# Patient Record
Sex: Female | Born: 1935
Health system: Southern US, Community
[De-identification: ages and names within clinical notes are randomized; demographics above are authoritative.]

## PROBLEM LIST (undated history)

## (undated) DIAGNOSIS — J189 Pneumonia, unspecified organism: Secondary | ICD-10-CM

## (undated) DIAGNOSIS — M7541 Impingement syndrome of right shoulder: Secondary | ICD-10-CM

## (undated) DIAGNOSIS — E669 Obesity, unspecified: Secondary | ICD-10-CM

## (undated) DIAGNOSIS — J45909 Unspecified asthma, uncomplicated: Secondary | ICD-10-CM

## (undated) DIAGNOSIS — E039 Hypothyroidism, unspecified: Secondary | ICD-10-CM

## (undated) DIAGNOSIS — M545 Low back pain, unspecified: Secondary | ICD-10-CM

## (undated) DIAGNOSIS — R350 Frequency of micturition: Secondary | ICD-10-CM

## (undated) DIAGNOSIS — I872 Venous insufficiency (chronic) (peripheral): Secondary | ICD-10-CM

## (undated) DIAGNOSIS — M549 Dorsalgia, unspecified: Secondary | ICD-10-CM

## (undated) DIAGNOSIS — R011 Cardiac murmur, unspecified: Secondary | ICD-10-CM

## (undated) DIAGNOSIS — J309 Allergic rhinitis, unspecified: Secondary | ICD-10-CM

## (undated) DIAGNOSIS — Z853 Personal history of malignant neoplasm of breast: Secondary | ICD-10-CM

## (undated) DIAGNOSIS — K219 Gastro-esophageal reflux disease without esophagitis: Secondary | ICD-10-CM

## (undated) DIAGNOSIS — I2510 Atherosclerotic heart disease of native coronary artery without angina pectoris: Secondary | ICD-10-CM

## (undated) DIAGNOSIS — C801 Malignant (primary) neoplasm, unspecified: Secondary | ICD-10-CM

## (undated) DIAGNOSIS — E785 Hyperlipidemia, unspecified: Secondary | ICD-10-CM

## (undated) DIAGNOSIS — M1990 Unspecified osteoarthritis, unspecified site: Secondary | ICD-10-CM

## (undated) DIAGNOSIS — G8929 Other chronic pain: Secondary | ICD-10-CM

## (undated) DIAGNOSIS — R600 Localized edema: Secondary | ICD-10-CM

## (undated) DIAGNOSIS — I10 Essential (primary) hypertension: Secondary | ICD-10-CM

## (undated) DIAGNOSIS — I288 Other diseases of pulmonary vessels: Secondary | ICD-10-CM

## (undated) HISTORY — DX: Venous insufficiency (chronic) (peripheral): I87.2

## (undated) HISTORY — PX: HERNIA REPAIR: SHX51

## (undated) HISTORY — DX: Impingement syndrome of right shoulder: M75.41

## (undated) HISTORY — DX: Personal history of malignant neoplasm of breast: Z85.3

## (undated) HISTORY — DX: Allergic rhinitis, unspecified: J30.9

## (undated) HISTORY — PX: BREAST LUMPECTOMY: SHX2

## (undated) HISTORY — DX: Hyperlipidemia, unspecified: E78.5

## (undated) HISTORY — DX: Hypothyroidism, unspecified: E03.9

## (undated) HISTORY — DX: Other diseases of pulmonary vessels: I28.8

## (undated) HISTORY — DX: Obesity, unspecified: E66.9

## (undated) HISTORY — PX: COLONOSCOPY: SHX174

## (undated) HISTORY — DX: Low back pain, unspecified: M54.50

## (undated) HISTORY — DX: Pneumonia, unspecified organism: J18.9

## (undated) HISTORY — DX: Localized edema: R60.0

## (undated) HISTORY — DX: Atherosclerotic heart disease of native coronary artery without angina pectoris: I25.10

## (undated) HISTORY — PX: JOINT REPLACEMENT: SHX530

## (undated) HISTORY — DX: Gastro-esophageal reflux disease without esophagitis: K21.9

## (undated) HISTORY — DX: Low back pain: M54.5

## (undated) HISTORY — PX: ABDOMINAL HYSTERECTOMY: SHX81

## (undated) HISTORY — PX: TOTAL HIP ARTHROPLASTY: SHX124

## (undated) HISTORY — DX: Unspecified osteoarthritis, unspecified site: M19.90

---
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
## 1999-01-04 ENCOUNTER — Encounter: Admission: RE | Admit: 1999-01-04 | Discharge: 1999-01-04 | Payer: Self-pay | Admitting: Internal Medicine

## 2000-01-29 ENCOUNTER — Encounter: Payer: Self-pay | Admitting: Emergency Medicine

## 2000-01-29 ENCOUNTER — Emergency Department (HOSPITAL_COMMUNITY): Admission: EM | Admit: 2000-01-29 | Discharge: 2000-01-29 | Payer: Self-pay | Admitting: Emergency Medicine

## 2000-06-26 ENCOUNTER — Encounter: Admission: RE | Admit: 2000-06-26 | Discharge: 2000-06-26 | Payer: Self-pay | Admitting: Internal Medicine

## 2000-07-06 ENCOUNTER — Encounter: Admission: RE | Admit: 2000-07-06 | Discharge: 2000-07-06 | Payer: Self-pay | Admitting: Internal Medicine

## 2000-08-24 ENCOUNTER — Encounter: Admission: RE | Admit: 2000-08-24 | Discharge: 2000-08-24 | Payer: Self-pay | Admitting: Internal Medicine

## 2001-02-19 ENCOUNTER — Encounter: Admission: RE | Admit: 2001-02-19 | Discharge: 2001-02-19 | Payer: Self-pay | Admitting: Internal Medicine

## 2001-03-02 ENCOUNTER — Ambulatory Visit (HOSPITAL_COMMUNITY): Admission: RE | Admit: 2001-03-02 | Discharge: 2001-03-02 | Payer: Self-pay | Admitting: Internal Medicine

## 2001-03-02 ENCOUNTER — Encounter: Payer: Self-pay | Admitting: Internal Medicine

## 2002-04-18 ENCOUNTER — Encounter: Admission: RE | Admit: 2002-04-18 | Discharge: 2002-04-18 | Payer: Self-pay | Admitting: Internal Medicine

## 2002-06-03 ENCOUNTER — Encounter: Admission: RE | Admit: 2002-06-03 | Discharge: 2002-06-03 | Payer: Self-pay | Admitting: Internal Medicine

## 2002-06-19 ENCOUNTER — Encounter: Admission: RE | Admit: 2002-06-19 | Discharge: 2002-06-19 | Payer: Self-pay | Admitting: Internal Medicine

## 2002-06-26 ENCOUNTER — Encounter: Admission: RE | Admit: 2002-06-26 | Discharge: 2002-06-26 | Payer: Self-pay | Admitting: Internal Medicine

## 2002-07-03 ENCOUNTER — Encounter: Admission: RE | Admit: 2002-07-03 | Discharge: 2002-07-03 | Payer: Self-pay | Admitting: Internal Medicine

## 2002-07-09 ENCOUNTER — Encounter: Admission: RE | Admit: 2002-07-09 | Discharge: 2002-07-09 | Payer: Self-pay | Admitting: Internal Medicine

## 2002-07-09 ENCOUNTER — Ambulatory Visit (HOSPITAL_COMMUNITY): Admission: RE | Admit: 2002-07-09 | Discharge: 2002-07-09 | Payer: Self-pay | Admitting: Internal Medicine

## 2002-07-09 ENCOUNTER — Encounter (INDEPENDENT_AMBULATORY_CARE_PROVIDER_SITE_OTHER): Payer: Self-pay | Admitting: Cardiology

## 2002-07-17 ENCOUNTER — Encounter: Admission: RE | Admit: 2002-07-17 | Discharge: 2002-07-17 | Payer: Self-pay | Admitting: Internal Medicine

## 2002-08-01 ENCOUNTER — Encounter: Admission: RE | Admit: 2002-08-01 | Discharge: 2002-08-01 | Payer: Self-pay | Admitting: Internal Medicine

## 2002-08-08 ENCOUNTER — Encounter: Admission: RE | Admit: 2002-08-08 | Discharge: 2002-08-08 | Payer: Self-pay | Admitting: Internal Medicine

## 2002-08-13 ENCOUNTER — Encounter: Admission: RE | Admit: 2002-08-13 | Discharge: 2002-08-13 | Payer: Self-pay | Admitting: Internal Medicine

## 2002-08-19 ENCOUNTER — Encounter: Admission: RE | Admit: 2002-08-19 | Discharge: 2002-08-19 | Payer: Self-pay | Admitting: Internal Medicine

## 2002-08-26 ENCOUNTER — Encounter: Admission: RE | Admit: 2002-08-26 | Discharge: 2002-08-26 | Payer: Self-pay | Admitting: Internal Medicine

## 2002-09-04 ENCOUNTER — Encounter: Admission: RE | Admit: 2002-09-04 | Discharge: 2002-09-04 | Payer: Self-pay | Admitting: Internal Medicine

## 2002-09-18 ENCOUNTER — Encounter: Admission: RE | Admit: 2002-09-18 | Discharge: 2002-09-18 | Payer: Self-pay | Admitting: Internal Medicine

## 2002-10-17 ENCOUNTER — Encounter: Admission: RE | Admit: 2002-10-17 | Discharge: 2002-10-17 | Payer: Self-pay | Admitting: Internal Medicine

## 2002-12-18 ENCOUNTER — Encounter: Admission: RE | Admit: 2002-12-18 | Discharge: 2002-12-18 | Payer: Self-pay | Admitting: Internal Medicine

## 2002-12-30 ENCOUNTER — Encounter: Admission: RE | Admit: 2002-12-30 | Discharge: 2002-12-30 | Payer: Self-pay | Admitting: Internal Medicine

## 2003-05-29 ENCOUNTER — Encounter: Admission: RE | Admit: 2003-05-29 | Discharge: 2003-05-29 | Payer: Self-pay | Admitting: Internal Medicine

## 2003-08-27 ENCOUNTER — Emergency Department (HOSPITAL_COMMUNITY): Admission: EM | Admit: 2003-08-27 | Discharge: 2003-08-27 | Payer: Self-pay | Admitting: Emergency Medicine

## 2003-09-04 ENCOUNTER — Encounter: Admission: RE | Admit: 2003-09-04 | Discharge: 2003-09-04 | Payer: Self-pay | Admitting: Internal Medicine

## 2003-11-24 ENCOUNTER — Ambulatory Visit: Payer: Self-pay | Admitting: Internal Medicine

## 2004-04-20 ENCOUNTER — Ambulatory Visit: Payer: Self-pay | Admitting: Internal Medicine

## 2004-05-06 ENCOUNTER — Ambulatory Visit: Payer: Self-pay | Admitting: Internal Medicine

## 2004-08-16 ENCOUNTER — Ambulatory Visit: Payer: Self-pay | Admitting: Internal Medicine

## 2004-08-31 ENCOUNTER — Ambulatory Visit: Payer: Self-pay | Admitting: Internal Medicine

## 2004-09-16 ENCOUNTER — Ambulatory Visit: Payer: Self-pay | Admitting: Internal Medicine

## 2004-09-30 ENCOUNTER — Ambulatory Visit: Payer: Self-pay | Admitting: Internal Medicine

## 2004-11-09 ENCOUNTER — Ambulatory Visit: Payer: Self-pay | Admitting: Internal Medicine

## 2004-12-08 ENCOUNTER — Ambulatory Visit: Payer: Self-pay | Admitting: Internal Medicine

## 2005-01-11 ENCOUNTER — Ambulatory Visit: Payer: Self-pay | Admitting: Internal Medicine

## 2005-01-11 ENCOUNTER — Encounter (INDEPENDENT_AMBULATORY_CARE_PROVIDER_SITE_OTHER): Payer: Self-pay | Admitting: *Deleted

## 2005-05-02 ENCOUNTER — Ambulatory Visit: Payer: Self-pay | Admitting: Internal Medicine

## 2005-05-04 ENCOUNTER — Encounter: Admission: RE | Admit: 2005-05-04 | Discharge: 2005-05-04 | Payer: Self-pay | Admitting: Family Medicine

## 2005-05-09 ENCOUNTER — Ambulatory Visit: Payer: Self-pay | Admitting: Hospitalist

## 2005-05-10 ENCOUNTER — Ambulatory Visit: Payer: Self-pay | Admitting: Internal Medicine

## 2005-05-18 ENCOUNTER — Ambulatory Visit: Payer: Self-pay | Admitting: Internal Medicine

## 2005-05-27 ENCOUNTER — Ambulatory Visit: Payer: Self-pay | Admitting: Internal Medicine

## 2005-05-31 ENCOUNTER — Encounter: Admission: RE | Admit: 2005-05-31 | Discharge: 2005-08-29 | Payer: Self-pay | Admitting: Pulmonary Disease

## 2005-06-01 ENCOUNTER — Ambulatory Visit: Payer: Self-pay | Admitting: Internal Medicine

## 2005-06-12 ENCOUNTER — Emergency Department (HOSPITAL_COMMUNITY): Admission: EM | Admit: 2005-06-12 | Discharge: 2005-06-12 | Payer: Self-pay | Admitting: Emergency Medicine

## 2005-06-15 ENCOUNTER — Ambulatory Visit: Payer: Self-pay | Admitting: Internal Medicine

## 2005-07-12 ENCOUNTER — Encounter: Payer: Self-pay | Admitting: Orthopedic Surgery

## 2005-07-20 ENCOUNTER — Ambulatory Visit: Payer: Self-pay | Admitting: Hospitalist

## 2005-07-20 ENCOUNTER — Inpatient Hospital Stay (HOSPITAL_COMMUNITY): Admission: RE | Admit: 2005-07-20 | Discharge: 2005-07-27 | Payer: Self-pay | Admitting: Orthopedic Surgery

## 2005-07-21 ENCOUNTER — Ambulatory Visit: Payer: Self-pay | Admitting: Physical Medicine & Rehabilitation

## 2005-07-22 ENCOUNTER — Encounter (INDEPENDENT_AMBULATORY_CARE_PROVIDER_SITE_OTHER): Payer: Self-pay | Admitting: *Deleted

## 2005-07-22 ENCOUNTER — Encounter (INDEPENDENT_AMBULATORY_CARE_PROVIDER_SITE_OTHER): Payer: Self-pay | Admitting: Pulmonary Disease

## 2005-08-16 ENCOUNTER — Ambulatory Visit: Payer: Self-pay | Admitting: Internal Medicine

## 2005-10-18 ENCOUNTER — Ambulatory Visit: Payer: Self-pay | Admitting: Internal Medicine

## 2005-10-28 ENCOUNTER — Encounter (INDEPENDENT_AMBULATORY_CARE_PROVIDER_SITE_OTHER): Payer: Self-pay | Admitting: *Deleted

## 2005-10-28 ENCOUNTER — Ambulatory Visit (HOSPITAL_COMMUNITY): Admission: RE | Admit: 2005-10-28 | Discharge: 2005-10-28 | Payer: Self-pay | Admitting: Internal Medicine

## 2005-11-03 ENCOUNTER — Encounter (INDEPENDENT_AMBULATORY_CARE_PROVIDER_SITE_OTHER): Payer: Self-pay | Admitting: *Deleted

## 2005-11-03 ENCOUNTER — Encounter (INDEPENDENT_AMBULATORY_CARE_PROVIDER_SITE_OTHER): Payer: Self-pay | Admitting: Diagnostic Radiology

## 2005-11-03 ENCOUNTER — Encounter: Admission: RE | Admit: 2005-11-03 | Discharge: 2005-11-03 | Payer: Self-pay | Admitting: Internal Medicine

## 2005-11-16 ENCOUNTER — Encounter: Admission: RE | Admit: 2005-11-16 | Discharge: 2005-11-16 | Payer: Self-pay | Admitting: Internal Medicine

## 2005-11-16 ENCOUNTER — Encounter (INDEPENDENT_AMBULATORY_CARE_PROVIDER_SITE_OTHER): Payer: Self-pay | Admitting: *Deleted

## 2005-12-13 ENCOUNTER — Encounter (INDEPENDENT_AMBULATORY_CARE_PROVIDER_SITE_OTHER): Payer: Self-pay | Admitting: *Deleted

## 2005-12-13 DIAGNOSIS — M1990 Unspecified osteoarthritis, unspecified site: Secondary | ICD-10-CM

## 2005-12-13 DIAGNOSIS — I10 Essential (primary) hypertension: Secondary | ICD-10-CM

## 2005-12-13 HISTORY — DX: Unspecified osteoarthritis, unspecified site: M19.90

## 2005-12-20 ENCOUNTER — Ambulatory Visit (HOSPITAL_COMMUNITY): Admission: RE | Admit: 2005-12-20 | Discharge: 2005-12-20 | Payer: Self-pay | Admitting: *Deleted

## 2005-12-20 ENCOUNTER — Encounter (INDEPENDENT_AMBULATORY_CARE_PROVIDER_SITE_OTHER): Payer: Self-pay | Admitting: Specialist

## 2005-12-20 ENCOUNTER — Encounter: Admission: RE | Admit: 2005-12-20 | Discharge: 2005-12-20 | Payer: Self-pay | Admitting: *Deleted

## 2006-02-09 ENCOUNTER — Ambulatory Visit: Payer: Self-pay | Admitting: Internal Medicine

## 2006-03-15 ENCOUNTER — Ambulatory Visit: Payer: Self-pay | Admitting: Hospitalist

## 2006-03-15 ENCOUNTER — Ambulatory Visit (HOSPITAL_COMMUNITY): Admission: RE | Admit: 2006-03-15 | Discharge: 2006-03-15 | Payer: Self-pay | Admitting: Hospitalist

## 2006-03-15 ENCOUNTER — Encounter (INDEPENDENT_AMBULATORY_CARE_PROVIDER_SITE_OTHER): Payer: Self-pay | Admitting: *Deleted

## 2006-03-15 LAB — CONVERTED CEMR LAB
BUN: 18 mg/dL (ref 6–23)
Chloride: 100 meq/L (ref 96–112)
Glucose, Bld: 95 mg/dL (ref 70–99)
HCT: 35.5 % — ABNORMAL LOW (ref 36.0–46.0)
Potassium: 3.9 meq/L (ref 3.5–5.3)
RDW: 13.4 % (ref 11.5–14.0)
Sodium: 136 meq/L (ref 135–145)
TSH: 6.912 microintl units/mL

## 2006-03-20 DIAGNOSIS — Z853 Personal history of malignant neoplasm of breast: Secondary | ICD-10-CM

## 2006-03-20 DIAGNOSIS — I872 Venous insufficiency (chronic) (peripheral): Secondary | ICD-10-CM

## 2006-03-20 DIAGNOSIS — E669 Obesity, unspecified: Secondary | ICD-10-CM

## 2006-03-20 DIAGNOSIS — M5441 Lumbago with sciatica, right side: Secondary | ICD-10-CM | POA: Insufficient documentation

## 2006-03-20 DIAGNOSIS — E039 Hypothyroidism, unspecified: Secondary | ICD-10-CM

## 2006-03-20 HISTORY — DX: Hypothyroidism, unspecified: E03.9

## 2006-03-20 HISTORY — DX: Obesity, unspecified: E66.9

## 2006-03-20 HISTORY — DX: Venous insufficiency (chronic) (peripheral): I87.2

## 2006-03-21 ENCOUNTER — Ambulatory Visit: Payer: Self-pay | Admitting: Gastroenterology

## 2006-06-01 ENCOUNTER — Ambulatory Visit: Payer: Self-pay | Admitting: Internal Medicine

## 2006-06-21 ENCOUNTER — Telehealth: Payer: Self-pay | Admitting: *Deleted

## 2006-06-22 ENCOUNTER — Encounter (INDEPENDENT_AMBULATORY_CARE_PROVIDER_SITE_OTHER): Payer: Self-pay | Admitting: *Deleted

## 2006-06-22 ENCOUNTER — Ambulatory Visit (HOSPITAL_COMMUNITY): Admission: RE | Admit: 2006-06-22 | Discharge: 2006-06-22 | Payer: Self-pay | Admitting: Internal Medicine

## 2006-09-05 ENCOUNTER — Telehealth: Payer: Self-pay | Admitting: *Deleted

## 2006-09-06 ENCOUNTER — Ambulatory Visit: Payer: Self-pay | Admitting: Internal Medicine

## 2006-09-06 DIAGNOSIS — J309 Allergic rhinitis, unspecified: Secondary | ICD-10-CM | POA: Insufficient documentation

## 2006-09-07 ENCOUNTER — Telehealth: Payer: Self-pay | Admitting: *Deleted

## 2006-10-20 ENCOUNTER — Ambulatory Visit: Payer: Self-pay | Admitting: Internal Medicine

## 2006-11-17 ENCOUNTER — Encounter: Admission: RE | Admit: 2006-11-17 | Discharge: 2006-11-17 | Payer: Self-pay | Admitting: Surgery

## 2007-02-13 ENCOUNTER — Telehealth: Payer: Self-pay | Admitting: Internal Medicine

## 2007-02-15 ENCOUNTER — Telehealth: Payer: Self-pay | Admitting: Internal Medicine

## 2007-03-19 ENCOUNTER — Telehealth: Payer: Self-pay | Admitting: Internal Medicine

## 2007-05-01 ENCOUNTER — Encounter: Payer: Self-pay | Admitting: Internal Medicine

## 2007-05-01 ENCOUNTER — Ambulatory Visit: Payer: Self-pay | Admitting: Internal Medicine

## 2007-05-02 DIAGNOSIS — E785 Hyperlipidemia, unspecified: Secondary | ICD-10-CM | POA: Insufficient documentation

## 2007-05-03 LAB — CONVERTED CEMR LAB
Basophils Absolute: 0 10*3/uL (ref 0.0–0.1)
Basophils Relative: 1 % (ref 0–1)
Cholesterol: 194 mg/dL (ref 0–200)
HCT: 38.1 % (ref 36.0–46.0)
HDL: 50 mg/dL (ref >39)
LDL Cholesterol: 122 mg/dL — ABNORMAL HIGH (ref 0–99)
Lymphocytes Relative: 25 % (ref 12–46)
MCV: 86.8 fL (ref 78.0–100.0)
Monocytes Absolute: 0.7 10*3/uL (ref 0.1–1.0)
Monocytes Relative: 8 % (ref 3–12)
Neutrophils Relative %: 62 % (ref 43–77)
Platelets: 340 10*3/uL (ref 150–400)
RDW: 15 % (ref 11.5–15.5)
Total CHOL/HDL Ratio: 3.9
VLDL: 22 mg/dL (ref 0–40)

## 2007-05-28 ENCOUNTER — Telehealth: Payer: Self-pay | Admitting: Internal Medicine

## 2007-07-19 ENCOUNTER — Telehealth: Payer: Self-pay | Admitting: Internal Medicine

## 2007-08-27 ENCOUNTER — Telehealth: Payer: Self-pay | Admitting: Internal Medicine

## 2007-09-04 ENCOUNTER — Telehealth: Payer: Self-pay | Admitting: Internal Medicine

## 2007-09-14 ENCOUNTER — Ambulatory Visit: Payer: Self-pay | Admitting: Infectious Diseases

## 2007-09-14 ENCOUNTER — Encounter: Payer: Self-pay | Admitting: Internal Medicine

## 2007-09-14 LAB — CONVERTED CEMR LAB
ALT: 8 units/L (ref 0–35)
AST: 14 units/L (ref 0–37)
Alkaline Phosphatase: 67 units/L (ref 39–117)
BUN: 20 mg/dL (ref 6–23)
Chloride: 100 meq/L (ref 96–112)
Creatinine, Ser: 0.89 mg/dL (ref 0.40–1.20)
Total Bilirubin: 0.3 mg/dL (ref 0.3–1.2)
Total CHOL/HDL Ratio: 3
Total Protein: 7.6 g/dL (ref 6.0–8.3)

## 2007-09-19 ENCOUNTER — Telehealth: Payer: Self-pay | Admitting: Internal Medicine

## 2007-11-19 ENCOUNTER — Encounter: Admission: RE | Admit: 2007-11-19 | Discharge: 2007-11-19 | Payer: Self-pay | Admitting: Surgery

## 2007-12-10 ENCOUNTER — Ambulatory Visit: Payer: Self-pay | Admitting: Internal Medicine

## 2007-12-10 ENCOUNTER — Encounter: Payer: Self-pay | Admitting: Internal Medicine

## 2007-12-10 LAB — CONVERTED CEMR LAB
ALT: <8 units/L (ref 0–35)
AST: 14 units/L (ref 0–37)
Basophils Absolute: 0 10*3/uL (ref 0.0–0.1)
Basophils Relative: 0 % (ref 0–1)
CO2: 21 meq/L (ref 19–32)
Calcium: 9.4 mg/dL (ref 8.4–10.5)
Chloride: 98 meq/L (ref 96–112)
Creatinine, Ser: 1.06 mg/dL (ref 0.40–1.20)
Eosinophils Absolute: 0.3 10*3/uL (ref 0.0–0.7)
HCT: 39.1 % (ref 36.0–46.0)
Hemoglobin, Urine: NEGATIVE
Hemoglobin: 12.6 g/dL (ref 12.0–15.0)
Lymphocytes Relative: 25 % (ref 12–46)
Lymphs Abs: 2 10*3/uL (ref 0.7–4.0)
MCHC: 32.2 g/dL (ref 30.0–36.0)
MCV: 84.8 fL (ref 78.0–100.0)
Monocytes Relative: 10 % (ref 3–12)
Platelets: 337 10*3/uL (ref 150–400)
Potassium: 3.5 meq/L (ref 3.5–5.3)
Protein, ur: NEGATIVE mg/dL
RBC: 4.61 M/uL (ref 3.87–5.11)
RDW: 13.8 % (ref 11.5–15.5)
Sodium: 137 meq/L (ref 135–145)
TSH: 4.475 microintl units/mL (ref 0.350–4.50)
Total Protein: 7.6 g/dL (ref 6.0–8.3)
Urobilinogen, UA: 0.2 (ref 0.0–1.0)
WBC: 8 10*3/uL (ref 4.0–10.5)
pH: 6 (ref 5.0–8.0)

## 2008-01-21 ENCOUNTER — Telehealth: Payer: Self-pay | Admitting: Internal Medicine

## 2008-02-18 ENCOUNTER — Telehealth: Payer: Self-pay | Admitting: Internal Medicine

## 2008-03-12 ENCOUNTER — Telehealth: Payer: Self-pay | Admitting: Internal Medicine

## 2008-03-26 ENCOUNTER — Encounter: Payer: Self-pay | Admitting: Internal Medicine

## 2008-03-26 ENCOUNTER — Ambulatory Visit: Payer: Self-pay | Admitting: Internal Medicine

## 2008-04-04 LAB — CONVERTED CEMR LAB
CO2: 21 meq/L (ref 19–32)
Chloride: 100 meq/L (ref 96–112)
Potassium: 4.2 meq/L (ref 3.5–5.3)

## 2008-04-18 ENCOUNTER — Telehealth: Payer: Self-pay | Admitting: Internal Medicine

## 2008-04-23 ENCOUNTER — Ambulatory Visit (HOSPITAL_BASED_OUTPATIENT_CLINIC_OR_DEPARTMENT_OTHER): Admission: RE | Admit: 2008-04-23 | Discharge: 2008-04-23 | Payer: Self-pay | Admitting: General Surgery

## 2008-04-23 ENCOUNTER — Encounter (INDEPENDENT_AMBULATORY_CARE_PROVIDER_SITE_OTHER): Payer: Self-pay | Admitting: General Surgery

## 2008-05-01 ENCOUNTER — Ambulatory Visit: Payer: Self-pay | Admitting: Internal Medicine

## 2008-05-05 ENCOUNTER — Telehealth: Payer: Self-pay | Admitting: *Deleted

## 2008-05-22 ENCOUNTER — Ambulatory Visit: Payer: Self-pay | Admitting: *Deleted

## 2008-05-22 ENCOUNTER — Encounter (INDEPENDENT_AMBULATORY_CARE_PROVIDER_SITE_OTHER): Payer: Self-pay | Admitting: Internal Medicine

## 2008-05-26 LAB — CONVERTED CEMR LAB
ALT: 8 units/L (ref 0–35)
AST: 15 units/L (ref 0–37)
Albumin: 3.9 g/dL (ref 3.5–5.2)
Calcium: 9.4 mg/dL (ref 8.4–10.5)
Chloride: 101 meq/L (ref 96–112)
Creatinine, Ser: 0.93 mg/dL (ref 0.40–1.20)
Microalb Creat Ratio: 5.4 mg/g (ref 0.0–30.0)
Potassium: 3.9 meq/L (ref 3.5–5.3)
Sed Rate: 52 mm/hr — ABNORMAL HIGH (ref 0–22)
Sodium: 141 meq/L (ref 135–145)

## 2008-07-07 ENCOUNTER — Ambulatory Visit: Payer: Self-pay | Admitting: Internal Medicine

## 2008-07-07 ENCOUNTER — Ambulatory Visit (HOSPITAL_COMMUNITY): Admission: RE | Admit: 2008-07-07 | Discharge: 2008-07-07 | Payer: Self-pay | Admitting: Internal Medicine

## 2008-08-20 ENCOUNTER — Encounter: Payer: Self-pay | Admitting: Internal Medicine

## 2008-08-20 ENCOUNTER — Ambulatory Visit: Payer: Self-pay | Admitting: Internal Medicine

## 2008-08-20 LAB — CONVERTED CEMR LAB
Basophils Absolute: 0.1 10*3/uL (ref 0.0–0.1)
CO2: 23 meq/L (ref 19–32)
GFR calc Af Amer: >60 mL/min (ref >60)
GFR calc non Af Amer: 55 mL/min — ABNORMAL LOW (ref >60)
Glucose, Bld: 88 mg/dL (ref 70–99)
Hemoglobin: 12.1 g/dL (ref 12.0–15.0)
Lymphocytes Relative: 26 % (ref 12–46)
Neutro Abs: 4.4 10*3/uL (ref 1.7–7.7)
Neutrophils Relative %: 56 % (ref 43–77)
Platelets: 311 10*3/uL (ref 150–400)
RDW: 14 % (ref 11.5–15.5)
Sodium: 138 meq/L (ref 135–145)
TSH: 4.128 microintl units/mL (ref 0.350–4.500)
Total Bilirubin: 0.3 mg/dL (ref 0.3–1.2)
Total Protein: 7.8 g/dL (ref 6.0–8.3)

## 2008-10-09 ENCOUNTER — Ambulatory Visit: Payer: Self-pay | Admitting: Internal Medicine

## 2008-10-27 ENCOUNTER — Ambulatory Visit: Payer: Self-pay | Admitting: Internal Medicine

## 2008-11-07 ENCOUNTER — Ambulatory Visit: Payer: Self-pay | Admitting: Internal Medicine

## 2008-11-13 ENCOUNTER — Telehealth (INDEPENDENT_AMBULATORY_CARE_PROVIDER_SITE_OTHER): Payer: Self-pay | Admitting: *Deleted

## 2008-11-20 ENCOUNTER — Encounter: Payer: Self-pay | Admitting: Internal Medicine

## 2008-11-20 ENCOUNTER — Encounter: Admission: RE | Admit: 2008-11-20 | Discharge: 2008-11-20 | Payer: Self-pay | Admitting: Internal Medicine

## 2008-12-17 ENCOUNTER — Telehealth (INDEPENDENT_AMBULATORY_CARE_PROVIDER_SITE_OTHER): Payer: Self-pay | Admitting: *Deleted

## 2009-02-11 ENCOUNTER — Telehealth: Payer: Self-pay | Admitting: *Deleted

## 2009-02-24 ENCOUNTER — Telehealth: Payer: Self-pay | Admitting: Internal Medicine

## 2009-02-25 ENCOUNTER — Telehealth: Payer: Self-pay | Admitting: Internal Medicine

## 2009-04-13 ENCOUNTER — Telehealth: Payer: Self-pay | Admitting: Internal Medicine

## 2009-04-16 ENCOUNTER — Ambulatory Visit: Payer: Self-pay | Admitting: Internal Medicine

## 2009-04-17 ENCOUNTER — Telehealth: Payer: Self-pay | Admitting: Internal Medicine

## 2009-04-17 ENCOUNTER — Encounter: Payer: Self-pay | Admitting: Internal Medicine

## 2009-04-17 LAB — CONVERTED CEMR LAB
ALT: <8 units/L (ref 0–35)
AST: 13 units/L (ref 0–37)
Albumin: 3.8 g/dL (ref 3.5–5.2)
Calcium: 8.9 mg/dL (ref 8.4–10.5)
Cholesterol: 143 mg/dL (ref 0–200)
HDL: 50 mg/dL (ref >39)
Sodium: 138 meq/L (ref 135–145)
TSH: 4.091 microintl units/mL (ref 0.350–4.5)
Total CHOL/HDL Ratio: 2.9

## 2009-09-28 ENCOUNTER — Telehealth: Payer: Self-pay | Admitting: Internal Medicine

## 2009-09-28 ENCOUNTER — Emergency Department (HOSPITAL_COMMUNITY): Admission: EM | Admit: 2009-09-28 | Discharge: 2009-09-28 | Payer: Self-pay | Admitting: Emergency Medicine

## 2009-09-29 ENCOUNTER — Telehealth: Payer: Self-pay | Admitting: Internal Medicine

## 2009-10-22 ENCOUNTER — Ambulatory Visit: Payer: Self-pay | Admitting: Internal Medicine

## 2009-10-22 LAB — CONVERTED CEMR LAB
ALT: 13 units/L (ref 0–35)
AST: 22 units/L (ref 0–37)
Albumin: 4.2 g/dL (ref 3.5–5.2)
Alkaline Phosphatase: 58 units/L (ref 39–117)
BUN: 17 mg/dL (ref 6–23)
Basophils Absolute: 0 10*3/uL (ref 0.0–0.1)
Basophils Relative: 0 % (ref 0–1)
CO2: 24 meq/L (ref 19–32)
Calcium: 9.1 mg/dL (ref 8.4–10.5)
Chloride: 105 meq/L (ref 96–112)
Creatinine, Ser: 0.82 mg/dL (ref 0.40–1.20)
Eosinophils Absolute: 0.4 10*3/uL (ref 0.0–0.7)
Eosinophils Relative: 5 % (ref 0–5)
Glucose, Bld: 106 mg/dL — ABNORMAL HIGH (ref 70–99)
HCT: 40.5 % (ref 36.0–46.0)
Hemoglobin: 12.4 g/dL (ref 12.0–15.0)
Lymphocytes Relative: 28 % (ref 12–46)
Lymphs Abs: 2.2 10*3/uL (ref 0.7–4.0)
MCHC: 30.6 g/dL (ref 30.0–36.0)
MCV: 85.3 fL (ref >=78.0)
Monocytes Absolute: 0.5 10*3/uL (ref 0.1–1.0)
Monocytes Relative: 7 % (ref 3–12)
Neutro Abs: 4.6 10*3/uL (ref 1.7–7.7)
Neutrophils Relative %: 60 % (ref 43–77)
Platelets: 288 10*3/uL (ref 150–400)
Potassium: 3.5 meq/L (ref 3.5–5.3)
RBC: 4.75 M/uL (ref 3.87–5.11)
RDW: 14.7 % (ref 11.5–15.5)
Sodium: 142 meq/L (ref 135–145)
TSH: 4.148 microintl units/mL (ref 0.350–4.5)
Total Bilirubin: 0.6 mg/dL (ref 0.3–1.2)
Total Protein: 7.3 g/dL (ref 6.0–8.3)
WBC: 7.7 10*3/uL (ref 4.0–10.5)

## 2009-11-02 ENCOUNTER — Telehealth: Payer: Self-pay | Admitting: *Deleted

## 2009-11-02 ENCOUNTER — Telehealth: Payer: Self-pay | Admitting: Internal Medicine

## 2009-11-03 ENCOUNTER — Telehealth: Payer: Self-pay | Admitting: Internal Medicine

## 2009-12-02 ENCOUNTER — Encounter: Admission: RE | Admit: 2009-12-02 | Discharge: 2009-12-02 | Payer: Self-pay | Admitting: Family Medicine

## 2010-01-07 ENCOUNTER — Ambulatory Visit: Payer: Self-pay | Admitting: Internal Medicine

## 2010-01-07 DIAGNOSIS — L259 Unspecified contact dermatitis, unspecified cause: Secondary | ICD-10-CM | POA: Insufficient documentation

## 2010-01-12 ENCOUNTER — Telehealth: Payer: Self-pay | Admitting: Internal Medicine

## 2010-04-06 NOTE — Progress Notes (Signed)
Summary: med refill/gp  Phone Note Refill Request Message from:  Fax from Pharmacy on April 13, 2009 9:20 AM  Refills Requested: Medication #1:  TRIAMTERENE-HCTZ 50-25 MG CAPS Take 1 tablet by mouth once a day   Last Refilled: 03/12/2009 Last appt. 04/16/09.   Method Requested: Electronic Initial call taken by: Chinita Pester RN,  April 13, 2009 9:20 AM  Follow-up for Phone Call        Refilled electronically.  Follow-up by: Margarito Liner MD,  April 14, 2009 12:52 PM    Prescriptions: TRIAMTERENE-HCTZ 50-25 MG CAPS (TRIAMTERENE-HCTZ) Take 1 tablet by mouth once a day  #30 x 3   Entered and Authorized by:   Margarito Liner MD   Signed by:   Margarito Liner MD on 04/14/2009   Method used:   Electronically to        [facility] [address] (retail)       [address]       [place], [place]  [address]       Ph: [phone]       Fax: [phone]   RxID:   [id number]

## 2010-04-06 NOTE — Progress Notes (Signed)
Summary: Referral  Phone Note Call from Patient   Caller: Daughter Call For: Natalie Fusi  DO Summary of Call: Call from pt's daughter.  Pt needs a referral to an Eye doctor.  They would like for the referral to be sent to Dr. Jama Flavors.  Fax number is [phone].  Telephone number is [phone].[name] RN  January 12, 2010 2:39 PM  Initial call taken by: [name] RN,  January 12, 2010 2:39 PM  Follow-up for Phone Call        ok will place referral please schedule for eval Follow-up by: Natalie Fusi  DO,  January 13, 2010 1:41 PM  Additional Follow-up for Phone Call Additional follow up Details #1::        Done.   Additional Follow-up by: [name] RN,  January 14, 2010 12:27 PM

## 2010-04-06 NOTE — Progress Notes (Signed)
Summary: H/A/ hla  Phone Note Call from Patient   Summary of Call: pt calls stating she has had a h/a since early this morning, worst h/a she has ever had, denies vision changes, speech changes but does admit to gait problems, SHE IS ADVISED TO HAVE SOMEONE TAKE HER TO [place] NOW OR CALL 911, she repeats back instructions and states she will go now Initial call taken by: Marin Roberts RN,  September 28, 2009 2:40 PM  Follow-up for Phone Call        agree! I will follow-up ED eval. Follow-up by: Julaine Fusi  DO,  October 01, 2009 10:11 PM

## 2010-04-06 NOTE — Progress Notes (Signed)
Summary: med refill/gp  Phone Note Refill Request Message from:  Fax from Pharmacy on September 29, 2009 3:48 PM  Refills Requested: Medication #1:  ULTRACET 37.5-325 MG TABS Take 1-2 tablets every 4 hours by mouth as needed for pain   Last Refilled: 09/04/2009 Next appt. Aug 18.   Method Requested: Telephone to Pharmacy Initial call taken by: Chinita Pester RN,  September 29, 2009 3:48 PM  Follow-up for Phone Call        Refill approved-nurse to complete Follow-up by: Julaine Fusi  DO,  October 01, 2009 10:13 PM    Prescriptions: ULTRACET 37.5-325 MG TABS (TRAMADOL-ACETAMINOPHEN) Take 1-2 tablets every 4 hours by mouth as needed for pain  #90 x 5   Entered and Authorized by:   Julaine Fusi  DO   Signed by:   Julaine Fusi  DO on 10/01/2009   Method used:   Electronically to        [facility] [address] (retail)       [address]       [place], [place]  [address]       Ph: [phone]       Fax: [phone]   RxID:   [id number]

## 2010-04-06 NOTE — Progress Notes (Signed)
Summary: prior authorization-Omnaris/gp  Phone Note Outgoing Call   Summary of Call: Prior Authorization require for Omnaris ; [id number] called and I talked to [facility].  It has been approved indefinitely; she will fax the approval letter. I will  call  [facility] pharmacy [facility] . Initial call taken by: Chinita Pester RN,  November 02, 2009 11:11 AM

## 2010-04-06 NOTE — Assessment & Plan Note (Signed)
Summary: CHECKUP/SB.   Vital Signs:  Patient profile:   75 year old female Height:      62.5 inches (158.75 cm) Weight:      260.04 pounds (118.20 kg) BMI:     46.97 O2 Sat:      97 % Temp:     97.6 degrees F (36.44 degrees C) oral Pulse rate:   80 / minute BP sitting:   163 / 86  (right arm)  Vitals Entered By: [name] RN (October 22, 2009 10:19 AM) CC: Depression Is Patient Diabetic? No Pain Assessment Patient in pain? no      Nutritional Status BMI of > 30 = obese  Have you ever been in a relationship where you felt threatened, hurt or afraid?No   Does patient need assistance? Functional Status Self care Ambulation Normal Comments Sinus problems.  Puffy eyes.  Sneezing-runny nose and eyes.  Some coughing-nonproductive.  Check up.  Needs refills.  Went to the ER.  Has some dizziness when she gets up in the morning and if she gets up to quick.   Primary Care Provider:  Julaine Fusi  DO  CC:  Depression.  History of Present Illness: [name] comes in today for routine follow-up. She needs refills on her regular medications. Additionally she is complaining of increased sinus congestion, puffy eyes and nasal congestion. Has some chronic allergies but much worse for the past week. Now with HA and facial pain. Reports trouble affording her Allergy pills now that they are OTC.  Depression History:      The patient denies a depressed mood most of the day and a diminished interest in her usual daily activities.         Preventive Screening-Counseling & Management  Alcohol-Tobacco     Smoking Status: quit     Year Quit: 40-35yrs ago  Allergies: 1)  ! Rosita Fire  Physical Exam  General:  alert and overweight-appearing. NAD.  Eyes:  conjunctival injection.   Ears:  R ear normal and L ear normal.   Nose:  nasal dischargemucosal pallor.   Mouth:  poor dentition.   Neck:  no neck tenderness.   Lungs:  normal respiratory effort, no accessory muscle use, normal breath  sounds, no crackles, and no wheezes.   Heart:  normal rate, regular rhythm, no gallop, and no rub.  Grade 1/6 SEM over outflow tract.    Abdomen:  soft and non-tender.   Msk:  no joint tenderness and no joint swelling.   Neurologic:  alert & oriented X3 and cranial nerves II-XII intact.   Skin:  no rashes.   Psych:  Oriented X3 and normally interactive.     Impression & Recommendations:  Problem # 1:  HYPERTENSION (ICD-401.9) BP elevated today. Has been out of her medications for 3 days. Will restart and re-check BP in 1-2 months.  Her updated medication list for this problem includes:    Enalapril Maleate 20 Mg Tabs (Enalapril maleate) .Marland Kitchen... Take 1 tablet by mouth two times a day    Norvasc 10 Mg Tabs (Amlodipine besylate) .Marland Kitchen... Take 1 tablet by mouth once a day    Maxzide-25 37.5-25 Mg Tabs (Triamterene-hctz) .Marland Kitchen... Take 1 tablet by mouth once a day  BP today: 163/86 Prior BP: 134/76 (04/16/2009)  Labs Reviewed: K+: 3.9 (04/17/2009) Creat: : 0.84 (04/17/2009)   Chol: 143 (04/17/2009)   HDL: 50 (04/17/2009)   LDL: 75 (04/17/2009)   TG: 90 (04/17/2009)  Her updated medication list for this  problem includes:    Enalapril Maleate 20 Mg Tabs (Enalapril maleate) .Marland Kitchen... Take 1 tablet by mouth two times a day    Norvasc 10 Mg Tabs (Amlodipine besylate) .Marland Kitchen... Take 1 tablet by mouth once a day    Maxzide-25 37.5-25 Mg Tabs (Triamterene-hctz) .Marland Kitchen... Take one pill by mouth once daily.  to replace triamterene / hctz  Orders: T-TSH ([id number])  Problem # 2:  SINUSITIS (ICD-473.9) Will treat for acute sinusitis/infection. Rec OTC Allegra or Zyrtec. Will try another nasal spray for allergies. Flonase not effective.  Her updated medication list for this problem includes:    Omnaris 50 Mcg/act Susp (Ciclesonide) .Marland Kitchen... 2 sprays in each nostril daily    Augmentin 500-125 Mg Tabs (Amoxicillin-pot clavulanate) .Marland Kitchen... Take 1 tablet by mouth two times a day  Problem # 3:  OSTEOARTHRITIS  (ICD-715.90) Effective pain control with Tramadol. Will continue. Her updated medication list for this problem includes:    Ultracet 37.5-325 Mg Tabs (Tramadol-acetaminophen) .Marland Kitchen... Take 1-2 tablets every 4 hours by mouth as needed for pain  Complete Medication List: 1)  Enalapril Maleate 20 Mg Tabs (Enalapril maleate) .... Take 1 tablet by mouth two times a day 2)  Norvasc 10 Mg Tabs (Amlodipine besylate) .... Take 1 tablet by mouth once a day 3)  Ultracet 37.5-325 Mg Tabs (Tramadol-acetaminophen) .... Take 1-2 tablets every 4 hours by mouth as needed for pain 4)  Zocor 20 Mg Tabs (Simvastatin) .... Take 1 tablet by mouth once a day 5)  Voltaren 1 % Gel (Diclofenac sodium) .... Apply to affected areas as directed 6)  Maxzide-25 37.5-25 Mg Tabs (Triamterene-hctz) .... Take 1 tablet by mouth once a day 7)  Omnaris 50 Mcg/act Susp (Ciclesonide) .... 2 sprays in each nostril daily 8)  Augmentin 500-125 Mg Tabs (Amoxicillin-pot clavulanate) .... Take 1 tablet by mouth two times a day  Other Orders: T-Comprehensive Metabolic Panel ([id number]) T-CBC w/Diff ([id number])  Patient Instructions: 1)  Please schedule a follow-up appointment in 2-3 months. Prescriptions: ULTRACET 37.5-325 MG TABS (TRAMADOL-ACETAMINOPHEN) Take 1-2 tablets every 4 hours by mouth as needed for pain  #90 x 5   Entered and Authorized by:   Julaine Fusi  DO   Signed by:   Julaine Fusi  DO on 10/22/2009   Method used:   Print then Give to Patient   RxID:   [id number] ULTRACET 37.5-325 MG TABS (TRAMADOL-ACETAMINOPHEN) Take 1-2 tablets every 4 hours by mouth as needed for pain  #90 x 5   Entered and Authorized by:   Julaine Fusi  DO   Signed by:   Julaine Fusi  DO on 10/22/2009   Method used:   Print then Give to Patient   RxID:   [id number] AUGMENTIN 500-125 MG TABS (AMOXICILLIN-POT CLAVULANATE) Take 1 tablet by mouth two times a day  #20 x 0   Entered and Authorized by:   Julaine Fusi  DO   Signed by:    Julaine Fusi  DO on 10/22/2009   Method used:   Electronically to        [facility] (retail)       [address]       [place], [place]  [address]       Ph: [phone]       Fax: [phone]   RxID:   [id number] OMNARIS 50 MCG/ACT SUSP (CICLESONIDE) 2 sprays in each nostril daily  #1 bottle x 3   Entered  and Authorized by:   Julaine Fusi  DO   Signed by:   Julaine Fusi  DO on 10/22/2009   Method used:   Electronically to        [facility] [address] (retail)       [address]       [place], [place]  [address]       Ph: [phone]       Fax: [phone]   RxID:   [id number] MAXZIDE-25 37.5-25 MG TABS (TRIAMTERENE-HCTZ) Take 1 tablet by mouth once a day  #90 x 1   Entered and Authorized by:   Julaine Fusi  DO   Signed by:   Julaine Fusi  DO on 10/22/2009   Method used:   Electronically to        [facility] (retail)       [address]       [place], [place]  [address]       Ph: [phone]       Fax: [phone]   RxID:   [id number]   Prevention & Chronic Care Immunizations   Influenza vaccine: Fluvax MCR  (04/16/2009)   Influenza vaccine deferral: Deferred  (10/09/2008)   Influenza vaccine due: 12/09/2008    Tetanus booster: Not documented   Td booster deferral: Deferred  (10/09/2008)    Pneumococcal vaccine: Not documented   Pneumococcal vaccine deferral: Deferred  (10/09/2008)    H. zoster vaccine: Not documented   H. zoster vaccine deferral: Deferred  (10/09/2008)  Colorectal Screening   Hemoccult: Not documented   Hemoccult action/deferral: Not indicated  (04/16/2009)    Colonoscopy: Location:  [facility].    (11/07/2008)   Colonoscopy action/deferral: GI referral  (10/09/2008)   Colonoscopy due: 11/2015  Other Screening   Pap smear: Not documented   Pap smear action/deferral: Not indicated S/P hysterectomy  (10/09/2008)    Mammogram: No  specific mammographic evidence of malignancy.    (11/20/2008)   Mammogram action/deferral: Ordered  (04/16/2009)   Mammogram due: 12/2009    DXA bone density scan: Not documented   DXA bone density action/deferral: Deferred  (04/16/2009)   Smoking status: quit  (10/22/2009)  Lipids   Total Cholesterol: 143  (04/17/2009)   Lipid panel action/deferral: Lipid Panel ordered   LDL: 75  (04/17/2009)   LDL Direct: Not documented   HDL: 50  (04/17/2009)   Triglycerides: 90  (04/17/2009)    SGOT (AST): 13  (04/17/2009)   BMP action: Ordered   SGPT (ALT): <8 U/L  (04/17/2009) CMP ordered    Alkaline phosphatase: 58  (04/17/2009)   Total bilirubin: 0.4  (04/17/2009)  Hypertension   Last Blood Pressure: 163 / 86  (10/22/2009)   Serum creatinine: 0.84  (04/17/2009)   BMP action: Ordered   Serum potassium 3.9  (04/17/2009) CMP ordered    Basic metabolic panel due: 07/14/2009  Self-Management Support :    Patient will work on the following items until the next clinic visit to reach self-care goals:     Medications and monitoring: take my medicines every day, bring all of my medications to every visit  (10/22/2009)     Eating: drink diet soda or water instead of juice or soda, eat more vegetables, use fresh or frozen vegetables, eat foods that are low in salt, eat baked foods instead of fried foods, eat fruit for snacks and desserts,  limit or avoid alcohol  (10/22/2009)     Activity: take a 30 minute walk every day  (10/22/2009)    Hypertension self-management support: Education handout, Psychologist, forensic, Resources for patients handout, Written self-care plan  (10/22/2009)   Hypertension self-care plan printed.   Hypertension education handout printed    Lipid self-management support: Education handout, Pre-printed educational material, Resources for patients handout, Written self-care plan  (10/22/2009)   Lipid self-care plan printed.   Lipid education handout  printed      Resource handout printed.  Process Orders Check Orders Results:     [facility]: Check successful Tests Sent for requisitioning (October 31, 2009 9:57 PM):     10/22/2009: [facility] -- T-Comprehensive Metabolic Panel [[id number]] (signed)     10/22/2009: [facility] -- T-TSH [id number] (signed)     10/22/2009: [facility] -- T-CBC w/Diff [[id number]] (signed)      Vital Signs:  Patient profile:   75 year old female Height:      62.5 inches (158.75 cm) Weight:      260.04 pounds (118.20 kg) BMI:     46.97 O2 Sat:      97 % Temp:     97.6 degrees F (36.44 degrees C) oral Pulse rate:   80 / minute BP sitting:   163 / 86  (right arm)  Vitals Entered By: [name] RN (October 22, 2009 10:19 AM)

## 2010-04-06 NOTE — Progress Notes (Signed)
Summary: Refill/gh  Phone Note Refill Request Message from:  Fax from Pharmacy on November 02, 2009 4:12 PM  Refills Requested: Medication #1:  Clartin 10 mg   Last Refilled: 11/01/2007  Method Requested: Electronic Initial call taken by: [name] RN,  November 02, 2009 4:13 PM  Follow-up for Phone Call        Refill completed Follow-up by: Julaine Fusi  DO,  November 02, 2009 8:44 PM    New/Updated Medications: LORATADINE 10 MG  TABS (LORATADINE) once daily as needed allergies Prescriptions: LORATADINE 10 MG  TABS (LORATADINE) once daily as needed allergies  #30 x 12   Entered and Authorized by:   Julaine Fusi  DO   Signed by:   Julaine Fusi  DO on 11/02/2009   Method used:   Electronically to        [facility] (retail)       [address]       [place], [place]  [address]       Ph: [phone]       Fax: [phone]   RxID:   [id number]

## 2010-04-06 NOTE — Assessment & Plan Note (Signed)
Summary: EST-3 MONTH F/U VISIT/CH   Vital Signs:  Patient profile:   75 year old female Height:      62.5 inches (158.75 cm) Weight:      257.04 pounds (116.84 kg) BMI:     46.43 Temp:     97.6 degrees F (36.44 degrees C) oral Pulse rate:   84 / minute BP sitting:   141 / 83  (right arm)  Vitals Entered By: [name] RN (January 07, 2010 9:31 AM) CC: Depression Is Patient Diabetic? No Pain Assessment Patient in pain? no      Nutritional Status BMI of > 30 = obese  Have you ever been in a relationship where you felt threatened, hurt or afraid?No   Does patient need assistance? Functional Status Self care Ambulation Normal Comments Check up.  Needs3 months sullpies on some of her meds.       Primary Care Provider:  Julaine Fusi  DO  CC:  Depression.  History of Present Illness: Ms. Coventry comes in today for routine follow-up. She is doing very well. Has been taking all of her medications as prescribed. Her only complaint is that she has a rash on her lower left leg that is intensly itching-worse at night and sh egets slight relief with OTC hydrocortisone. She has chenged to free and clear detergents, no contact or triggers known.No trauma or bites.Mildly tender and inflammed.  Depression History:      The patient denies a depressed mood most of the day and a diminished interest in her usual daily activities.        The patient denies that she feels like life is not worth living, denies that she wishes that she were dead, and denies that she has thought about ending her life.         Preventive Screening-Counseling & Management  Alcohol-Tobacco     Smoking Status: quit     Year Quit: 40-32yrs ago  Current Medications (verified): 1)  Enalapril Maleate 20 Mg Tabs (Enalapril Maleate) .... Take 1 Tablet By Mouth Two Times A Day 2)  Norvasc 10 Mg  Tabs (Amlodipine Besylate) .... Take 1 Tablet By Mouth Once A Day 3)  Ultracet 37.5-325 Mg Tabs (Tramadol-Acetaminophen)  .... Take 1-2 Tablets Every 4 Hours By Mouth As Needed For Pain 4)  Zocor 20 Mg  Tabs (Simvastatin) .... Take 1 Tablet By Mouth Once A Day 5)  Voltaren 1 % Gel (Diclofenac Sodium) .... Apply To Affected Areas As Directed 6)  Maxzide-25 37.5-25 Mg Tabs (Triamterene-Hctz) .... Take 1 Tablet By Mouth Once A Day 7)  Omnaris 50 Mcg/act Susp (Ciclesonide) .... 2 Sprays in Each Nostril Daily 8)  Loratadine 10 Mg  Tabs (Loratadine) .... Once Daily As Needed Allergies 9)  Clotrimazole-Betamethasone 1-0.05 % Crea (Clotrimazole-Betamethasone) .... Apply To Left Lower Leg Twice Daily As Directed 10)  Doxycycline Hyclate 100 Mg Tabs (Doxycycline Hyclate) .... Take 1 Tablet By Mouth Two Times A Day  Allergies (verified): 1)  ! Rosita Fire  Review of Systems      See HPI  Physical Exam  General:  alert and overweight-appearing. NAD.  Neck:  no neck tenderness or masses palpated Lungs:  Normal respiratory effort, chest expands symmetrically. Lungs are clear to auscultation, no crackles or wheezes. Heart:  Normal rate and regular rhythm. S1 and S2 normal without gallop, murmur, click, rub or other extra sounds. Abdomen:  Bowel sounds positive,abdomen soft and non-tender without masses, organomegaly or hernias noted. Msk:  Advanced knee OA changes. Skin:  Multiple round lesions in various stages of healing and marked excoriations, mild erythema surounding and tenderness. Some darkly scabbed lesions located on left lateral leg. otherwise skin in dry but clear. Psych:  Oriented X3 and memory intact for recent and remote.     Impression & Recommendations:  Problem # 1:  NUMMULAR ECZEMA (ICD-692.9) Prone for atopic reactions. Favor ecezema with possible surrounding fungal or bacterial infection. Will treat with combo steroid antifungal and give her 7 days of doxycycline for mild early cellulitis surrounding teh lesions. I advised her to wrap the area to prevent scratching which is potentiating the spread of  the eczema and cycle of the rash. F/U in 3-4 weeks if not imporved-sooner if the rash gets better.  Her updated medication list for this problem includes:    Loratadine 10 Mg Tabs (Loratadine) ..... Once daily as needed allergies  Problem # 2:  HYPERTENSION (ICD-401.9) Very close to goal today. No changes. F/U in 6 months.  Her updated medication list for this problem includes:    Enalapril Maleate 20 Mg Tabs (Enalapril maleate) .Marland Kitchen... Take 1 tablet by mouth two times a day    Norvasc 10 Mg Tabs (Amlodipine besylate) .Marland Kitchen... Take 1 tablet by mouth once a day    Maxzide-25 37.5-25 Mg Tabs (Triamterene-hctz) .Marland Kitchen... Take 1 tablet by mouth once a day  Problem # 3:  OBESITY NOS (ICD-278.00) Discussed weight loss plan and improving activity level.  Problem # 4:  Preventive Health Care (ICD-V70.0) She plans on scheduling an appointment with an eye doctor this month.  Problem # 5:  OSTEOARTHRITIS (ICD-715.90) Excellent relief from OA pain with topical Voltaren gel and tramadol as needed. No changes. Her updated medication list for this problem includes:    Ultracet 37.5-325 Mg Tabs (Tramadol-acetaminophen) .Marland Kitchen... Take 1-2 tablets every 4 hours by mouth as needed for pain  Complete Medication List: 1)  Enalapril Maleate 20 Mg Tabs (Enalapril maleate) .... Take 1 tablet by mouth two times a day 2)  Norvasc 10 Mg Tabs (Amlodipine besylate) .... Take 1 tablet by mouth once a day 3)  Ultracet 37.5-325 Mg Tabs (Tramadol-acetaminophen) .... Take 1-2 tablets every 4 hours by mouth as needed for pain 4)  Zocor 20 Mg Tabs (Simvastatin) .... Take 1 tablet by mouth once a day 5)  Voltaren 1 % Gel (Diclofenac sodium) .... Apply to affected areas as directed 6)  Maxzide-25 37.5-25 Mg Tabs (Triamterene-hctz) .... Take 1 tablet by mouth once a day 7)  Omnaris 50 Mcg/act Susp (Ciclesonide) .... 2 sprays in each nostril daily 8)  Loratadine 10 Mg Tabs (Loratadine) .... Once daily as needed allergies 9)   Clotrimazole-betamethasone 1-0.05 % Crea (Clotrimazole-betamethasone) .... Apply to left lower leg twice daily as directed 10)  Doxycycline Hyclate 100 Mg Tabs (Doxycycline hyclate) .... Take 1 tablet by mouth two times a day  Other Orders: Influenza Vaccine MCR ([address])  Patient Instructions: 1)  Please schedule a follow-up appointment in 6 months. If leg does not clear up in 1 month return for evaluation.  Prescriptions: DOXYCYCLINE HYCLATE 100 MG TABS (DOXYCYCLINE HYCLATE) Take 1 tablet by mouth two times a day  #14 x 0   Entered and Authorized by:   Julaine Fusi  DO   Signed by:   Julaine Fusi  DO on 01/07/2010   Method used:   Electronically to        [facility] [address] (retail)  [address]       [place], [place]  [address]       Ph: [phone]       Fax: [phone]   RxID:   [id number] CLOTRIMAZOLE-BETAMETHASONE 1-0.05 % CREA (CLOTRIMAZOLE-BETAMETHASONE) apply to left lower leg twice daily as directed  #1 tube x 3   Entered and Authorized by:   Julaine Fusi  DO   Signed by:   Julaine Fusi  DO on 01/07/2010   Method used:   Electronically to        [facility] (retail)       [address]       [place], [place]  [address]       Ph: [phone]       Fax: [phone]   RxID:   [id number] OMNARIS 50 MCG/ACT SUSP (CICLESONIDE) 2 sprays in each nostril daily  #1 bottle x 3   Entered and Authorized by:   Julaine Fusi  DO   Signed by:   Julaine Fusi  DO on 01/07/2010   Method used:   Electronically to        [facility] [address] (retail)       [address]       [place], [place]  [address]       Ph: [phone]       Fax: [phone]   RxID:   [id number] ENALAPRIL MALEATE 20 MG TABS (ENALAPRIL MALEATE) Take 1 tablet by mouth two times a day  #180 x 3   Entered and Authorized by:   Julaine Fusi  DO   Signed by:   Julaine Fusi  DO on 01/07/2010   Method used:    Electronically to        [facility] [address] (retail)       [address]       [place], [place]  [address]       Ph: [phone]       Fax: [phone]   RxID:   [id number] ZOCOR 20 MG  TABS (SIMVASTATIN) Take 1 tablet by mouth once a day  #90 x 3   Entered and Authorized by:   Julaine Fusi  DO   Signed by:   Julaine Fusi  DO on 01/07/2010   Method used:   Electronically to        [facility] [address] (retail)       [address]       [place], [place]  [address]       Ph: [phone]       Fax: [phone]   RxID:   [id number] ULTRACET 37.5-325 MG TABS (TRAMADOL-ACETAMINOPHEN) Take 1-2 tablets every 4 hours by mouth as needed for pain  #90 x 5   Entered and Authorized by:   Julaine Fusi  DO   Signed by:   Julaine Fusi  DO on 01/07/2010   Method used:   Electronically to        [facility] [address] (retail)       [address]       [place], [place]  [address]       Ph: [phone]  Fax: [phone]   RxID:   [id number] LORATADINE 10 MG  TABS (LORATADINE) once daily as needed allergies  #90 x 3   Entered and Authorized by:   Julaine Fusi  DO   Signed by:   Julaine Fusi  DO on 01/07/2010   Method used:   Electronically to        [facility] [address] (retail)       [address]       [place], [place]  [address]       Ph: [phone]       Fax: [phone]   RxID:   [id number] NORVASC 10 MG  TABS (AMLODIPINE BESYLATE) Take 1 tablet by mouth once a day  #90 x 3   Entered and Authorized by:   Julaine Fusi  DO   Signed by:   Julaine Fusi  DO on 01/07/2010   Method used:   Electronically to        [facility] [address] (retail)       [address]       [place], [place]  [address]       Ph: [phone]       Fax: [phone]   RxID:   [id number] MAXZIDE-25 37.5-25 MG TABS (TRIAMTERENE-HCTZ) Take 1 tablet by mouth once a day  #90 x  3   Entered and Authorized by:   Julaine Fusi  DO   Signed by:   Julaine Fusi  DO on 01/07/2010   Method used:   Electronically to        [facility] [address] (retail)       [address]       [place], [place]  [address]       Ph: [phone]       Fax: [phone]   RxID:   [id number]    Orders Added: 1)  Influenza Vaccine MCR [00025] 2)  Est. Patient Level IV [40086]   Immunizations Administered:  Influenza Vaccine # 1:    Vaccine Type: Fluvax MCR    Site: right deltoid    Mfr: GlaxoSmithKline    Dose: 0.5 ml    Route: IM    Given by: [name] RN    Exp. Date: 09/04/2010    Lot #: [id number]    VIS given: 09/29/09 version given January 07, 2010.  Flu Vaccine Consent Questions:    Do you have a history of severe allergic reactions to this vaccine? no    Any prior history of allergic reactions to egg and/or gelatin? no    Do you have a sensitivity to the preservative Thimersol? no    Do you have a past history of Guillan-Barre Syndrome? no    Do you currently have an acute febrile illness? no    Have you ever had a severe reaction to latex? no    Vaccine information given and explained to patient? yes    Are you currently pregnant? no   Immunizations Administered:  Influenza Vaccine # 1:    Vaccine Type: Fluvax MCR    Site: right deltoid    Mfr: GlaxoSmithKline    Dose: 0.5 ml    Route: IM    Given by: [name] RN    Exp. Date: 09/04/2010    Lot #: [id number]    VIS given: 09/29/09 version  given January 07, 2010.  Prevention & Chronic Care Immunizations   Influenza vaccine: Fluvax MCR  (01/07/2010)   Influenza vaccine deferral: Deferred  (10/09/2008)   Influenza vaccine due: 12/09/2008    Tetanus booster: Not documented   Td booster deferral: Deferred  (10/09/2008)    Pneumococcal vaccine: Not documented   Pneumococcal vaccine deferral: Deferred  (10/09/2008)    H. zoster vaccine: Not documented   H. zoster  vaccine deferral: Deferred  (10/09/2008)  Colorectal Screening   Hemoccult: Not documented   Hemoccult action/deferral: Not indicated  (04/16/2009)    Colonoscopy: Location:  [facility].    (11/07/2008)   Colonoscopy action/deferral: GI referral  (10/09/2008)   Colonoscopy due: 11/2015  Other Screening   Pap smear: Not documented   Pap smear action/deferral: Not indicated S/P hysterectomy  (10/09/2008)    Mammogram: Normal  (01/07/2010)   Mammogram action/deferral: Ordered  (04/16/2009)   Mammogram due: 01/08/2011    DXA bone density scan: Not documented   DXA bone density action/deferral: Deferred  (04/16/2009)   Smoking status: quit  (01/07/2010)  Lipids   Total Cholesterol: 143  (04/17/2009)   Lipid panel action/deferral: Lipid Panel ordered   LDL: 75  (04/17/2009)   LDL Direct: Not documented   HDL: 50  (04/17/2009)   Triglycerides: 90  (04/17/2009)    SGOT (AST): 22  (10/22/2009)   BMP action: Ordered   SGPT (ALT): 13  (10/22/2009)   Alkaline phosphatase: 58  (10/22/2009)   Total bilirubin: 0.6  (10/22/2009)    Lipid flowsheet reviewed?: Yes   Progress toward LDL goal: At goal  Hypertension   Last Blood Pressure: 141 / 83  (01/07/2010)   Serum creatinine: 0.82  (10/22/2009)   BMP action: Ordered   Serum potassium 3.5  (10/22/2009)   Basic metabolic panel due: 07/14/2009    Hypertension flowsheet reviewed?: Yes   Progress toward BP goal: Improved  Self-Management Support :    Patient will work on the following items until the next clinic visit to reach self-care goals:     Medications and monitoring: take my medicines every day, bring all of my medications to every visit  (01/07/2010)     Eating: drink diet soda or water instead of juice or soda, eat more vegetables, use fresh or frozen vegetables, eat foods that are low in salt, eat baked foods instead of fried foods, eat fruit for snacks and desserts, limit or avoid alcohol  (01/07/2010)      Activity: take a 30 minute walk every day  (01/07/2010)    Hypertension self-management support: Written self-care plan, Education handout, Pre-printed educational material, Resources for patients handout  (01/07/2010)   Hypertension self-care plan printed.   Hypertension education handout printed    Lipid self-management support: Written self-care plan, Education handout, Pre-printed educational material, Resources for patients handout  (01/07/2010)   Lipid self-care plan printed.   Lipid education handout printed      Resource handout printed.    Vital Signs:  Patient profile:   75 year old female Height:      62.5 inches (158.75 cm) Weight:      257.04 pounds (116.84 kg) BMI:     46.43 Temp:     97.6 degrees F (36.44 degrees C) oral Pulse rate:   84 / minute BP sitting:   141 / 83  (right arm)  Vitals Entered By: [name] RN (January 07, 2010 9:31 AM)

## 2010-04-06 NOTE — Assessment & Plan Note (Signed)
Summary: EST-CK/FU/MEDS/CFB   Vital Signs:  Patient profile:   75 year old female Height:      62.5 inches (158.75 cm) Weight:      268.9 pounds (122.23 kg) BMI:     48.57 Temp:     97.9 degrees F (36.61 degrees C) oral Pulse rate:   91 / minute BP sitting:   134 / 76  (right arm)  Vitals Entered By: Blenda Mounts (April 16, 2009 9:48 AM) CC: check up, med refill Is Patient Diabetic? No Pain Assessment Patient in pain? no      Nutritional Status BMI of > 30 = obese  Have you ever been in a relationship where you felt threatened, hurt or afraid?No   Does patient need assistance? Functional Status Self care Ambulation Normal   Primary Care Provider:  Julaine Fusi  DO  CC:  check up and med refill.  History of Present Illness: [name] comes in today for routine f/u on her Hypertension. No new complaints today. She is still having bilateral wrist pain from OA.  Depression History:      The patient denies a depressed mood most of the day and a diminished interest in her usual daily activities.        The patient denies that she feels like life is not worth living, denies that she wishes that she were dead, and denies that she has thought about ending her life.         Preventive Screening-Counseling & Management  Alcohol-Tobacco     Smoking Status: quit     Year Quit: 40-78yrs ago  Caffeine-Diet-Exercise     Does Patient Exercise: yes     Type of exercise: WALK      Times/week: 7  Current Medications (verified): 1)  Enalapril Maleate 20 Mg Tabs (Enalapril Maleate) .... Take 1 Tablet By Mouth Two Times A Day 2)  Triamterene-Hctz 50-25 Mg Caps (Triamterene-Hctz) .... Take 1 Tablet By Mouth Once A Day 3)  Norvasc 10 Mg  Tabs (Amlodipine Besylate) .... Take 1 Tablet By Mouth Once A Day 4)  Ultracet 37.5-325 Mg Tabs (Tramadol-Acetaminophen) .... Take 1-2 Tablets Every 4 Hours By Mouth As Needed For Pain 5)  Zocor 20 Mg  Tabs (Simvastatin) .... Take 1 Tablet By Mouth  Once A Day 6)  Voltaren 1 % Gel (Diclofenac Sodium) .... Apply To Affected Areas As Directed  Allergies (verified): 1)  ! Rosita Fire  Physical Exam  General:  alert and overweight-appearing. NAD.  Lungs:  normal respiratory effort, no accessory muscle use, normal breath sounds, no crackles, and no wheezes.   Heart:  normal rate, regular rhythm, no gallop, and no rub.  Grade 1/6 SEM over outflow tract.    Abdomen:  soft and non-tender.   Msk:  right ankle has old scar over medial proximal ankle, some swelling between the achilles tendon and the medial malleolus, mildly tender, no lesions. reduced ROM. trace edema-pitting bilateral ankles.   Impression & Recommendations:  Problem # 1:  HYPERLIPIDEMIA (ICD-272.4) Will check lipids today and LFTs.  Her updated medication list for this problem includes:    Zocor 20 Mg Tabs (Simvastatin) .Marland Kitchen... Take 1 tablet by mouth once a day  Orders: T-Lipid Profile ([id number])  Problem # 2:  HYPERTENSION (ICD-401.9)  Her updated medication list for this problem includes:    Enalapril Maleate 20 Mg Tabs (Enalapril maleate) .Marland Kitchen... Take 1 tablet by mouth two times a day    Norvasc 10 Mg Tabs (Amlodipine  besylate) .Marland Kitchen... Take 1 tablet by mouth once a day    Maxzide-25 37.5-25 Mg Tabs (Triamterene-hctz) .Marland Kitchen... Take one pill by mouth once daily.  to replace triamterene / hctz  Orders: T-Basic Metabolic Panel [phone]) T-TSH [phone])  BP today: 134/76 Prior BP: 111/73 (10/09/2008)  Labs Reviewed: K+: 4.3 (08/20/2008) Creat: : 1.00 (08/20/2008)   Chol: 145 (09/14/2007)   HDL: 49 (09/14/2007)   LDL: 78 (09/14/2007)   TG: 92 (09/14/2007)  Complete Medication List: 1)  Enalapril Maleate 20 Mg Tabs (Enalapril maleate) .... Take 1 tablet by mouth two times a day 2)  Norvasc 10 Mg Tabs (Amlodipine besylate) .... Take 1 tablet by mouth once a day 3)  Ultracet 37.5-325 Mg Tabs (Tramadol-acetaminophen) .... Take 1-2 tablets every 4 hours by mouth as  needed for pain 4)  Zocor 20 Mg Tabs (Simvastatin) .... Take 1 tablet by mouth once a day 5)  Voltaren 1 % Gel (Diclofenac sodium) .... Apply to affected areas as directed 6)  Maxzide-25 37.5-25 Mg Tabs (Triamterene-hctz) .... Take one pill by mouth once daily.  to replace triamterene / hctz  Other Orders: Mammogram (Screening) (Mammo) Influenza Vaccine MCR (30865) T-Hepatic Function ([id number])   Patient Instructions: 1)  Please schedule a follow-up appointment in 2-3 months. Prescriptions: ZOCOR 20 MG  TABS (SIMVASTATIN) Take 1 tablet by mouth once a day  #30 x 11   Entered and Authorized by:   Julaine Fusi  DO   Signed by:   Julaine Fusi  DO on 04/16/2009   Method used:   Electronically to        [facility] (retail)       [address]       [place], [place]  [address]       Ph: [phone]       Fax: [phone]   RxID:   [id number] ULTRACET 37.5-325 MG TABS (TRAMADOL-ACETAMINOPHEN) Take 1-2 tablets every 4 hours by mouth as needed for pain  #90 x 5   Entered and Authorized by:   Julaine Fusi  DO   Signed by:   Julaine Fusi  DO on 04/16/2009   Method used:   Electronically to        [facility] [address] (retail)       [address]       [place], [place]  [address]       Ph: [phone]       Fax: [phone]   RxID:   [id number] NORVASC 10 MG  TABS (AMLODIPINE BESYLATE) Take 1 tablet by mouth once a day  #30 x 11   Entered and Authorized by:   Julaine Fusi  DO   Signed by:   Julaine Fusi  DO on 04/16/2009   Method used:   Electronically to        [facility] [address] (retail)       [address]       [place], [place]  [address]       Ph: [phone]       Fax: [phone]   RxID:   [id number] TRIAMTERENE-HCTZ 50-25 MG CAPS (TRIAMTERENE-HCTZ) Take 1 tablet by mouth once a day  #30 x 11   Entered and Authorized by:   Julaine Fusi  DO   Signed by:   Julaine Fusi  DO on  04/16/2009   Method used:   Electronically to        [facility] [address] (retail)       [address]       [place], [place]  [address]       Ph: [phone]       Fax: [phone]   RxID:   [id number] ENALAPRIL MALEATE 20 MG TABS (ENALAPRIL MALEATE) Take 1 tablet by mouth two times a day  #60 x 11   Entered and Authorized by:   Julaine Fusi  DO   Signed by:   Julaine Fusi  DO on 04/16/2009   Method used:   Electronically to        [facility] [address] (retail)       [address]       [place], [place]  [address]       Ph: [phone]       Fax: [phone]   RxID:   [id number]   Prevention & Chronic Care Immunizations   Influenza vaccine: Fluvax MCR  (04/16/2009)   Influenza vaccine deferral: Deferred  (10/09/2008)   Influenza vaccine due: 12/09/2008    Tetanus booster: Not documented   Td booster deferral: Deferred  (10/09/2008)    Pneumococcal vaccine: Not documented   Pneumococcal vaccine deferral: Deferred  (10/09/2008)    H. zoster vaccine: Not documented   H. zoster vaccine deferral: Deferred  (10/09/2008)  Colorectal Screening   Hemoccult: Not documented   Hemoccult action/deferral: Not indicated  (04/16/2009)    Colonoscopy: Location:  [facility].    (11/07/2008)   Colonoscopy action/deferral: GI referral  (10/09/2008)   Colonoscopy due: 11/2015  Other Screening   Pap smear: Not documented   Pap smear action/deferral: Not indicated S/P hysterectomy  (10/09/2008)    Mammogram: Not documented   Mammogram action/deferral: Ordered  (04/16/2009)    DXA bone density scan: Not documented   DXA bone density action/deferral: Deferred  (04/16/2009)   Smoking status: quit  (04/16/2009)  Lipids   Total Cholesterol: 145  (09/14/2007)   Lipid panel action/deferral: Lipid Panel ordered   LDL: 78  (09/14/2007)   LDL Direct: Not documented   HDL: 49  (09/14/2007)   Triglycerides: 92   (09/14/2007)    SGOT (AST): 16  (08/20/2008)   BMP action: Ordered   SGPT (ALT): <8 U/L  (08/20/2008)   Alkaline phosphatase: 49  (08/20/2008)   Total bilirubin: 0.3  (08/20/2008)    Lipid flowsheet reviewed?: Yes   Progress toward LDL goal: Improved  Hypertension   Last Blood Pressure: 134 / 76  (04/16/2009)   Serum creatinine: 1.00  (08/20/2008)   BMP action: Ordered   Serum potassium 4.3  (08/20/2008)   Basic metabolic panel due: 07/14/2009    Hypertension flowsheet reviewed?: Yes   Progress toward BP goal: Improved  Self-Management Support :    Patient will work on the following items until the next clinic visit to reach self-care goals:     Medications and monitoring: take my medicines every day, bring all of my medications to every visit  (04/16/2009)     Eating: drink diet soda or water instead of juice or soda, eat more vegetables, eat foods that are low in salt, eat baked foods instead of fried foods, eat fruit for snacks and desserts  (04/16/2009)     Activity: take a 30 minute walk every  day  (04/16/2009)    Hypertension self-management support: Written self-care plan  (04/16/2009)   Hypertension self-care plan printed.    Lipid self-management support: Written self-care plan  (04/16/2009)   Lipid self-care plan printed.   Nursing Instructions: Schedule screening mammogram (see order) Give Flu vaccine today   Process Orders Check Orders Results:     [facility]: Check successful Tests Sent for requisitioning (April 22, 2009 2:20 PM):     04/16/2009: [facility] -- T-Lipid Profile [id number] (signed)     04/16/2009: [facility] -- T-Hepatic Function [phone] (signed)     04/16/2009: [facility] -- T-Basic Metabolic Panel [phone] (signed)     04/16/2009: [facility] -- T-TSH [phone] (signed)     Immunizations Administered:  Influenza Vaccine # 1:     Vaccine Type: Fluvax MCR    Site: right deltoid    Mfr: novrtis    Dose: 0.5 ml    Route: IM    Given by: Shapale Watlington/ [name], RN    Exp. Date: 06/2009    Lot #: [id number]    VIS given: 09/28/06 version given April 16, 2009.  Flu Vaccine Consent Questions:    Do you have a history of severe allergic reactions to this vaccine? no    Any prior history of allergic reactions to egg and/or gelatin? no    Do you have a sensitivity to the preservative Thimersol? no    Do you have a past history of Guillan-Barre Syndrome? no    Do you currently have an acute febrile illness? no    Have you ever had a severe reaction to latex? no    Vaccine information given and explained to patient? yes    Are you currently pregnant? no

## 2010-04-06 NOTE — Progress Notes (Signed)
Summary: med refill/gp  Phone Note Refill Request Message from:  Fax from Pharmacy on April 17, 2009 11:02 AM  Refills Requested: Medication #1:  TRIAMTERENE-HCTZ 50-25 MG CAPS Take 1 tablet by mouth once a day The pharmacy is unable to purchase this strength any more; Maxzide 25 is on their $4.00 list. LAst appt/BMP 04/16/09.   Method Requested: Electronic Initial call taken by: Chinita Pester RN,  April 17, 2009 11:02 AM Caller:    Follow-up for Phone Call        Called pt to inform her her med no longer available.  Offered to split the two meds into two pills at the same dosage as the old pill or change to maxzide with a slightly lower dose of triamterene.  We will change to maxzide.  F/U Dr Phillips Odor in 2 -3 months.   Follow-up by: Blanch Media MD,  April 17, 2009 11:15 AM     Appended Document: med refill/gp    Clinical Lists Changes  Medications: Removed medication of TRIAMTERENE-HCTZ 50-25 MG CAPS (TRIAMTERENE-HCTZ) Take 1 tablet by mouth once a day Added new medication of MAXZIDE-25 37.5-25 MG TABS (TRIAMTERENE-HCTZ) Take one pill by mouth once daily.  To replace triamterene / HCTZ - Signed Rx of MAXZIDE-25 37.5-25 MG TABS (TRIAMTERENE-HCTZ) Take one pill by mouth once daily.  To replace triamterene / HCTZ;  #31 x 4;  Signed;  Entered by: Blanch Media MD;  Authorized by: Blanch Media MD;  Method used: Electronically to [facility] [address], [place], [place], [place]  [address], Ph: [phone], Fax: [phone]    Prescriptions: MAXZIDE-25 37.5-25 MG TABS (TRIAMTERENE-HCTZ) Take one pill by mouth once daily.  To replace triamterene / HCTZ  #31 x 4   Entered and Authorized by:   Blanch Media MD   Signed by:   Blanch Media MD on 04/17/2009   Method used:   Electronically to        [facility] (retail)       [address]       [place], [place]  [address]       Ph: [phone]  Fax: [phone]   RxID:   [id number]    Appended Document: med refill/gp Pt was called about changed in medication and she will call back in 2-3 months for an appt. (per Dr.Gionna Polak).

## 2010-04-06 NOTE — Progress Notes (Signed)
Summary: refill/gg  Phone Note Refill Request  on November 03, 2009 1:49 PM  Refills Requested: Medication #1:  VOLTAREN 1 % GEL apply to affected areas as directed   Last Refilled: 07/31/2009  Method Requested: Electronic Initial call taken by: Merrie Roof RN,  November 03, 2009 1:50 PM  Follow-up for Phone Call        08/2008 - Voltarin extremely helpful for OA pain Follow-up by: Blanch Media MD,  November 10, 2009 9:18 AM    Prescriptions: VOLTAREN 1 % GEL (DICLOFENAC SODIUM) apply to affected areas as directed  #100g x 11   Entered and Authorized by:   Blanch Media MD   Signed by:   Blanch Media MD on 11/10/2009   Method used:   Electronically to        [facility] (retail)       [address]       [place], [place]  [address]       Ph: [phone]       Fax: [phone]   RxID:   [id number]

## 2010-04-14 ENCOUNTER — Telehealth: Payer: Self-pay | Admitting: *Deleted

## 2010-04-14 NOTE — Telephone Encounter (Signed)
Call form pt wants to get a Handicap Sticker.  Previous sticker has expired.

## 2010-04-14 NOTE — Telephone Encounter (Signed)
This totally appropriate for her. I am happy to sign it for her.

## 2010-04-16 NOTE — Telephone Encounter (Signed)
Call to pt to inform her that Dr. Phillips Odor has done the paperwork for her Handicap Sticker.  Pt said that she will come to the front desk to pick up.  Paper to front desk area for pick up.

## 2010-04-20 ENCOUNTER — Encounter: Payer: Self-pay | Admitting: Internal Medicine

## 2010-04-20 ENCOUNTER — Telehealth: Payer: Self-pay | Admitting: *Deleted

## 2010-04-20 NOTE — Telephone Encounter (Signed)
Ok no problem. Will do letter now.

## 2010-04-20 NOTE — Telephone Encounter (Signed)
Pt's handicapped permit had expired, pt didn't realize and got a ticket in [place], she has been told by "the people at the courthouse" that she needs a letter from her pcp stating that she was indeed handicapped from 03/07/10 and it is continous, may address to whom it may concern and call her at [phone] when it is ready, she needs it this week.

## 2010-05-22 LAB — DIFFERENTIAL
Basophils Absolute: 0.1 10*3/uL (ref 0.0–0.1)
Eosinophils Relative: 4 % (ref 0–5)
Lymphocytes Relative: 22 % (ref 12–46)
Monocytes Absolute: 0.8 10*3/uL (ref 0.1–1.0)
Monocytes Relative: 8 % (ref 3–12)
Neutro Abs: 6.6 10*3/uL (ref 1.7–7.7)

## 2010-05-22 LAB — POCT CARDIAC MARKERS
CKMB, poc: 1.3 ng/mL (ref 1.0–8.0)
Myoglobin, poc: 135 ng/mL (ref 12–200)
Troponin i, poc: <0.05 ng/mL (ref 0.00–0.09)

## 2010-05-22 LAB — POCT I-STAT, CHEM 8
BUN: 7 mg/dL (ref 6–23)
Chloride: 108 mEq/L (ref 96–112)
Creatinine, Ser: 0.9 mg/dL (ref 0.4–1.2)
Potassium: 3.6 mEq/L (ref 3.5–5.1)
Sodium: 140 mEq/L (ref 135–145)
TCO2: 26 mmol/L (ref 0–100)

## 2010-05-22 LAB — URINALYSIS, ROUTINE W REFLEX MICROSCOPIC
Bilirubin Urine: NEGATIVE
Glucose, UA: NEGATIVE mg/dL
Nitrite: NEGATIVE
Specific Gravity, Urine: 1.011 (ref 1.005–1.030)
pH: 6 (ref 5.0–8.0)

## 2010-05-22 LAB — CBC
Platelets: 281 10*3/uL (ref 150–400)
RBC: 4.4 MIL/uL (ref 3.87–5.11)
RDW: 14.5 % (ref 11.5–15.5)
WBC: 10.1 10*3/uL (ref 4.0–10.5)

## 2010-05-22 LAB — URINE MICROSCOPIC-ADD ON

## 2010-06-02 ENCOUNTER — Ambulatory Visit: Payer: Self-pay | Admitting: Internal Medicine

## 2010-07-20 NOTE — Op Note (Signed)
NAMEMYCHAEL, SOOTS                   ACCOUNT NO.:  [id number]   MEDICAL RECORD NO.:  [id number]          PATIENT TYPE:  AMB   LOCATION:  DSC                          FACILITY:  [facility]   PHYSICIAN:  Anselm Pancoast. Weatherly, M.D.DATE OF BIRTH:  29-May-1935   DATE OF PROCEDURE:  04/23/2008  DATE OF DISCHARGE:                               OPERATIVE REPORT   PREOPERATIVE DIAGNOSIS:  Right-sided headaches, rule out temporal  arteritis.   OPERATION:  Right temporal artery biopsy.   ANESTHESIA:  Local.   SURGEON:  Anselm Pancoast. Zachery Dakins, MD   HISTORY:  [name] is a 74 year old female who I saw in the Medicine  Clinic for elevated sed rate and a history of right-sided headaches.  They questioned whether she had temporal arteritis, had no other  explanation of the sed rate of 73 and referred her to [place] for a temporal  artery biopsy, but she has been on prednisone now for approximately 3  weeks.  On questioning her, it appears that she has had the headaches  intermittently for a long period of time.  She does have a history of  high blood pressure and otherwise no evidence of any visual changes etc.  I discussed with her that the temporal artery biopsy is really a  diagnostic test, is not treatment of anything, but agreed to proceed on  with a temporal artery biopsy and she is here today.  I saw her in the  office on Friday.  The patient was positioned on the OR table.  We can  palpate the artery slightly, but I used the Doppler and could hear the  good arterial flow about 1 cm superior top of the ear lobe.  I then  prepped her with Betadine solution and used about 4 mL of 1% plain  Xylocaine to anesthetize the area.  I then made a small incision  probably about 2 cm or 3 cm in length right over the area, we could see  the little pulsating artery, and then I anesthetized the area a little  more distally, so I could get about an inch segment of the artery.  The  artery itself looked normal to me  and I tied off the proximal end with 4-  0 Vicryl and then the 2 little branches where it bifurcates were  individually tied off and then the segmental artery, probably about an  inch in length was sent for pathology exam.  Grossly, the artery looks  normal to me.  There is a surrounded vein near it, and I could not see  any other pulsating structures.  The skin after good hemostasis was  closed with running 6-0 nylon suture, and we will have the results of  the biopsy early next week.  She is to continue with steroids, but  hopefully if this is negative, the steroids can be discontinued as they  are causing her to have an increased appetite, affecting her blood  pressure, etc.           ______________________________  Anselm Pancoast. Zachery Dakins, M.D.  WJW/MEDQ  D:  04/23/2008  T:  04/24/2008  Job:  [id number]   cc:   Dr. Renette Butters

## 2010-07-23 NOTE — Discharge Summary (Signed)
NAMETEKILA, [name]                   ACCOUNT NO.:  [id number]   MEDICAL RECORD NO.:  [id number]          PATIENT TYPE:  INP   LOCATION:  4707                         FACILITY:  [facility]   PHYSICIAN:  Nadara Mustard, MD     DATE OF BIRTH:  [birth year]   DATE OF ADMISSION:  07/20/2005  DATE OF DISCHARGE:  07/27/2005                                 DISCHARGE SUMMARY   DIAGNOSIS:  Osteoarthritis right hip.   PROCEDURE:  Right total hip arthroplasty.   Discharged to home in stable condition with home health physical therapy,  DVT prophylaxis, with Coumadin and pain medication with Vicodin.   HISTORY OF PRESENT ILLNESS:  The patient is [name] 75 year old woman with  osteoarthritis of the right hip.  The patient failed conservative care and  wished to proceed with total hip arthroplasty at this time.   The patient's hospital course was essentially unremarkable.  She underwent [name]  right total hip arthroplasty with DePuy components with ASR cup 52 mm in  diameter, [name] 46 mm metal head with [name] #10 stem.  The patient received Kefzol  for infection prophylaxis for 24 hours and was started on Coumadin for DVT  prophylaxis.  Patient are progressed slowly with physical therapy.  She also  had [name] sinus tachycardia on postoperative day #2 and medicine was consulted  for evaluation and treatment of her sinus tachycardia.  The patient  progressed slowly with physical therapy.  Rehab was consulted and insurance  did not authorize rehab stay.  The patient was ambulating approximately 15  feet on Jul 26, 2005.   The patient was discharged to home in stable condition on Jul 27, 2005.  Follow-up in the office in 2 weeks.  [facility] for home health  physical therapy, Coumadin for DVT prophylaxis.  Follow-up in the office in  two weeks.      Nadara Mustard, MD  Electronically Signed     MVD/MEDQ  D:  08/25/2005  T:  08/25/2005  Job:  [id number]

## 2010-07-23 NOTE — Op Note (Signed)
NAMEMyleen [name], Shaquanda                   ACCOUNT NO.:  [id number]   MEDICAL RECORD NO.:  [id number]          PATIENT TYPE:  AMB   LOCATION:  SDS                          FACILITY:  [facility]   PHYSICIAN:  Alfonse Ras, MD   DATE OF BIRTH:  23-Oct-1935   DATE OF PROCEDURE:  12/20/2005  DATE OF DISCHARGE:                                 OPERATIVE REPORT   PREOPERATIVE DIAGNOSIS:  Ductal carcinoma in situ of the right breast.   POSTOPERATIVE DIAGNOSIS:  Ductal carcinoma in situ of the right breast.   PROCEDURE:  Needle localization and partial mastectomy.   SURGEON:  Alfonse Ras, MD   ANESTHESIA:  General.   DESCRIPTION:  The patient was taken to the operating room, placed in the  supine position and a previously identified wire was placed in the right  breast.  After general anesthesia was induced, using endotracheal tube, the  right breast was prepped and draped in a normal sterile fashion.  Using an  elliptical incision around the wire the breast was quite large, but I  followed the wire down, excising all tissue around the wire, and then  marking the inferior margin with a stitch.   Specimen mammography verified the presence of the clip.  Adequate hemostasis  was assured; and the skin was closed with subcuticular 3-0 Monocryl.  Steri-  Strips and a sterile dressing was applied.  The patient tolerated the  procedure well and went to PACU in good condition.      Alfonse Ras, MD  Electronically Signed     KRE/MEDQ  D:  12/20/2005  T:  12/21/2005  Job:  [id number]

## 2010-07-23 NOTE — Op Note (Signed)
[name], [name]                   ACCOUNT NO.:  [id number]   MEDICAL RECORD NO.:  [id number]          PATIENT TYPE:  INP   LOCATION:  5012                         FACILITY:  [facility]   PHYSICIAN:  Nadara Mustard, MD     DATE OF BIRTH:  [birth year]   DATE OF PROCEDURE:  07/20/2005  DATE OF DISCHARGE:                                 OPERATIVE REPORT   PREOPERATIVE DIAGNOSIS:  Osteoarthritis right hip.   POSTOPERATIVE DIAGNOSIS:  Osteoarthritis right hip.   PROCEDURE:  Right total hip arthroplasty with DePuy metal-on-metal  components with a 52-mm acetabulum, 46 mm head, number 10 stem with a +2 mm  sleeve.   SURGEON.:  Nadara Mustard, M.D.   ANESTHESIA:  General.   ESTIMATED BLOOD LOSS:  Minimal.   ANTIBIOTICS:  1 gram of Kefzol.   DRAINS:  None.   COMPLICATIONS:  None.   DISPOSITION:  To the PACU in stable condition.   INDICATIONS FOR PROCEDURE:  The patient is a 75 year old woman with  osteoarthritis of her right hip.  The patient has failed conservative care.  She has pain with activities of daily living and wishes to proceed with  total hip arthroplasty.  The risks and benefits were discussed including  infection, neurovascular injury, persistent pain, DVT, pulmonary embolus,  and dislocation of the hip.  The patient states she understands and wished  proceed at this time.   DESCRIPTION OF PROCEDURE:  The patient was brought to OR room 1 and  underwent a general anesthetic.  After an adequate level of anesthesia was  obtained, the patient was placed in the left lateral decubitus position with  the right side up and the right lower extremity was prepped using DuraPrep,  draped into a sterile field, Ioban was used to cover all exposed skin.  A  posterior lateral incision was made.  This was carried down to the tensor  fascia lata which was split. A Charnley retractor was placed.  The distal  aspect of the gluteus maximus was released.  The piriformis and short  external  rotators were tagged, cut and retracted.  The capsule was  transected and elevated as one block of tissue.  The hip was dislocated, the  femoral neck cut was made 1 cm proximal to the calcar.  Attention was first  focused on the acetabulum.  The acetabulum was sequentially reamed to 51 mm  for a 52 mm acetabulum.  The 52 mm acetabulum liner was placed.  The wound  was irrigated continuously throughout the case.  Attention was then focused  on the femur.  The femur was broached sequentially to a size 10.  The trial  10 broach was left in place. The hip was reduced with the trial head and the  hip was placed through a full range of motion.  She had flexion of 120  degrees which was stable.  She had full abduction and internal rotation of  70 degrees. The hip was felt stable.  She has full extension and external  rotation, the hip was stable.  The trial  components were then removed. The  wound was irrigated with normal saline.  The final Corail stem was placed  with a 46 mm head.  The wound was irrigated and the hip was reduced.  Again,  the hip was placed through a full range of motion and the hip was stable.  The capsule was reapproximated using #2 Vicryl.  The piriformis and short  external rotators were reapproximated using #2 Vicryl.  The tensor fascia  lata was closed using a running #2 Vicryl.  Subcu and deep subcu were closed  using 2-0 Vicryl.  The skin was closed using approximate staples.  The wound  was covered with Adaptic, orthopedic sponges, ABD dressing, and Hypafix  tape.  The patient was extubated, placed in a knee immobilizer, and  transferred to the PACU in stable condition.      Nadara Mustard, MD  Electronically Signed     MVD/MEDQ  D:  07/20/2005  T:  07/20/2005  Job:  [id number]

## 2010-09-09 ENCOUNTER — Ambulatory Visit (HOSPITAL_COMMUNITY)
Admission: RE | Admit: 2010-09-09 | Discharge: 2010-09-09 | Disposition: A | Discharge status: Home / Self Care | Payer: Medicare Other | Source: Ambulatory Visit | Attending: Internal Medicine | Admitting: Internal Medicine

## 2010-09-09 ENCOUNTER — Encounter: Payer: Self-pay | Admitting: Internal Medicine

## 2010-09-09 ENCOUNTER — Ambulatory Visit (INDEPENDENT_AMBULATORY_CARE_PROVIDER_SITE_OTHER): Payer: Medicare Other | Admitting: Internal Medicine

## 2010-09-09 VITALS — BP 120/80 | HR 79 | Temp 97.4°F | Ht 62.0 in | Wt 257.6 lb

## 2010-09-09 DIAGNOSIS — M545 Low back pain, unspecified: Secondary | ICD-10-CM

## 2010-09-09 DIAGNOSIS — Z0000 Encounter for general adult medical examination without abnormal findings: Secondary | ICD-10-CM | POA: Insufficient documentation

## 2010-09-09 DIAGNOSIS — J309 Allergic rhinitis, unspecified: Secondary | ICD-10-CM

## 2010-09-09 DIAGNOSIS — M25511 Pain in right shoulder: Secondary | ICD-10-CM

## 2010-09-09 DIAGNOSIS — M7541 Impingement syndrome of right shoulder: Secondary | ICD-10-CM | POA: Insufficient documentation

## 2010-09-09 DIAGNOSIS — Z853 Personal history of malignant neoplasm of breast: Secondary | ICD-10-CM

## 2010-09-09 DIAGNOSIS — E785 Hyperlipidemia, unspecified: Secondary | ICD-10-CM

## 2010-09-09 DIAGNOSIS — Z23 Encounter for immunization: Secondary | ICD-10-CM

## 2010-09-09 DIAGNOSIS — E039 Hypothyroidism, unspecified: Secondary | ICD-10-CM

## 2010-09-09 DIAGNOSIS — M25519 Pain in unspecified shoulder: Secondary | ICD-10-CM

## 2010-09-09 DIAGNOSIS — M47817 Spondylosis without myelopathy or radiculopathy, lumbosacral region: Secondary | ICD-10-CM | POA: Insufficient documentation

## 2010-09-09 HISTORY — DX: Impingement syndrome of right shoulder: M75.41

## 2010-09-09 LAB — CBC WITH DIFFERENTIAL/PLATELET
Basophils Relative: 1 % (ref 0–1)
Eosinophils Absolute: 0.4 10*3/uL (ref 0.0–0.7)
Eosinophils Relative: 6 % — ABNORMAL HIGH (ref 0–5)
Hemoglobin: 12.6 g/dL (ref 12.0–15.0)
Lymphs Abs: 2 10*3/uL (ref 0.7–4.0)
MCH: 26.3 pg (ref 26.0–34.0)
MCHC: 31.7 g/dL (ref 30.0–36.0)
MCV: 83.1 fL (ref 78.0–100.0)
Monocytes Absolute: 0.6 10*3/uL (ref 0.1–1.0)
Monocytes Relative: 8 % (ref 3–12)
RBC: 4.79 MIL/uL (ref 3.87–5.11)

## 2010-09-09 LAB — LIPID PANEL
Cholesterol: 203 mg/dL — ABNORMAL HIGH (ref 0–200)
VLDL: 20 mg/dL (ref 0–40)

## 2010-09-09 MED ORDER — ACETAMINOPHEN-CODEINE 300-30 MG PO TABS: 1.0000 | ORAL_TABLET | Freq: Two times a day (BID) | ORAL | Status: DC | PRN

## 2010-09-09 MED ORDER — ENALAPRIL MALEATE 20 MG PO TABS: 20.0000 mg | ORAL_TABLET | Freq: Two times a day (BID) | ORAL | Status: DC

## 2010-09-09 MED ORDER — LEVOCETIRIZINE DIHYDROCHLORIDE 5 MG PO TABS: 5.0000 mg | ORAL_TABLET | Freq: Every evening | ORAL | Status: DC

## 2010-09-09 MED ORDER — TRIAMTERENE-HCTZ 37.5-25 MG PO TABS: 1.0000 | ORAL_TABLET | Freq: Every day | ORAL | Status: DC

## 2010-09-09 MED ORDER — SIMVASTATIN 20 MG PO TABS: 20.0000 mg | ORAL_TABLET | Freq: Every day | ORAL | Status: DC

## 2010-09-09 MED ORDER — AMLODIPINE BESYLATE 10 MG PO TABS: 10.0000 mg | ORAL_TABLET | Freq: Every day | ORAL | Status: DC

## 2010-09-09 NOTE — Assessment & Plan Note (Addendum)
-   suspect right shoulder bursitis, or nerve impingement. - will arrange the X ray of shoulder to r/o any bony abnormalities   Will refer her to see sports medicine clinic for further treatment and possible Physical therapy   will prescribe Tylenol # 3 1 tab BID PRN for pain temporarily until she sees a doctor in sports medicine clinic.  Pt was instructed to stop Ultracet.

## 2010-09-09 NOTE — Assessment & Plan Note (Addendum)
Pt is on Zocor for this problem. However pt stated that she lost her zocor 4-5 months ago and did not seek additional refills. Therefore she has not had zocor for the duration. - will give her pt prescription and instructed her to take it nightly.  -Reinforced the importance of controlling the blood lipid panel to decrease the cardia-vascular risks.

## 2010-09-09 NOTE — Progress Notes (Signed)
Subjective:    Patient ID: [name], female    DOB: 1935-05-15, 75 y.o.   MRN: [id number] CC: right shoulder pain         Lower back pain HPI This is a 75 year old pleasant lady with past medical history of Hypertension, hyperlipidemia, obesity, chronic back pain and breast cancer presents to the clinic today for right shoulder pain and lower back pain.  Right shoulder pain x 61mo, gradual onset, constant, 8-10/10, worsening with shoulder movement, rest and pain med make it better.  Pt c/o limited use of her right shoulder, unable to reach things, and unable to comb her hair.  Pt denies any injury or fall. No previous history of shoulder problem. No chest or neck pain. No shortness of breath. Pt never seek medical treatment until today. Instead, she has been self-managing it with Ultracet oiginally prescribed for her hip pain, which only partially relieved her shoulder pain.  Pt is noted to have history of chronic back pain, which has been stable. Her back pain is worsening with standing up and walking,  and she feels better with sitting down and bending forward. Pt stated that her pain level is 5/10, localized at her lower back sometimes radiating to the back of  her right upper thigh.  Her back pain is controlled by Ultracet.   Past Medical History  Diagnosis Date  . Hypothyroidism   . Lumbago   . Hyperlipidemia   . Hx of breast cancer   . Allergic rhinitis, cause unspecified    History   Social History  . Marital Status: Legally Separated    Spouse Name: N/A    Number of Children: N/A  . Years of Education: N/A   Occupational History  . not working    Social History Main Topics  . Smoking status: Former Smoker    Quit date: 09/09/1958  . Smokeless tobacco: Never Used  . Alcohol Use: No  . Drug Use: No  . Sexually Active: Not on file   Other Topics Concern  . Not on file   Social History Narrative  . No narrative on file    Family History  Problem Relation Age of  Onset  . Diabetes Mother   . Hypertension Mother   . Alzheimer's disease Mother    Medication list 1) Enalapril Maleate 20 Mg Tabs (Enalapril maleate) .... Take 1 tablet by mouth two times a day  2) Norvasc 10 Mg Tabs (Amlodipine besylate) .... Take 1 tablet by mouth once a day  3) Ultracet 37.5-325 Mg Tabs (Tramadol-acetaminophen) .... Take 1-2 tablets every 4 hours by mouth as needed for pain  4) Zocor 20 Mg Tabs (Simvastatin) .... Take 1 tablet by mouth once a day  5) Maxzide-25 37.5-25 Mg Tabs (Triamterene-hctz) .... Take 1 tablet by mouth once a day  6) Omnaris 50 Mcg/act Susp (Ciclesonide) .... 2 sprays in each nostril daily  7) Loratadine 10 Mg Tabs (Loratadine) .... Once daily as needed allergies   Review of Systems  No headache, fever, or sore throat. No shortness of breath or dyspnea on exertion. No chest pain, chest pressure or palpitation.  No nausea, vomiting, or abdominal pain. No melena, diarrhea or incontinence. No muscle weakness.                   Denies depression. No appetite or weight changes.      Objective:   Physical Exam  General: alert, well-developed, and cooperative to examination.  Mouth: pharynx pink and moist.  Neck: supple. Lungs: normal respiratory effort, no accessory muscle use, normal breath sounds, no crackles, and no wheezes. Heart: normal rate, regular rhythm, no murmur, no gallop, and no rub.  Abdomen: soft, non-tender, normal bowel sounds, no distention, no guarding, no rebound tenderness, no hepatomegaly, and no splenomegaly.  Msk: no joint swelling, no joint warmth, and no redness over joints.  Right shoulder deltoid area tenderness noted, limited right shoulder abduction and flexion less than 80 degrees before pt stopped and c/o shoulder pain. No muscle atrophy noted. No bony abnormality noted. Lower back no tenderness noted. Negative straight leg raise test. No muscle weakness, numbness or tingling Pulses: 2+ DP/PT pulses  bilaterally Extremities: No cyanosis, clubbing, edema Neurologic: alert & oriented X3, cranial nerves II-XII intact, strength normal in all extremities, sensation intact to light touch, and gait normal.  All reflexes within normal limits. Skin: turgor normal and no rashes.  Psych: Oriented X3, memory intact for recent and remote, normally interactive, good eye contact, not anxious appearing, and not depressed appearing.    Assessment & Plan:

## 2010-09-09 NOTE — Assessment & Plan Note (Signed)
Pt denies palpitation, weight loss or weight gain, or constipation. Last TSH was WNL 4.148 on 10/22/2009.  Will Pt is not on any treatment for hypothyroidism. - will recheck TSH during this visit.

## 2010-09-09 NOTE — Assessment & Plan Note (Addendum)
This is a chronic problem. Pt stated that she did not have any X ray on her back before.  It is unlikely any acute situation. - will arrange for her to have X ray of lumbar spine - She is on Tylenol #3 for her shoulder pain now. I do not think that she needs additional pain medication. - will continue to follow up with her.

## 2010-09-09 NOTE — Patient Instructions (Signed)
1. Will refer you to see sport medicine clinic for your shoulder pain 2. Will prescribe Tylenol #3 1 tab twice a day as needed for your shoulder pain until your see the sport medicine doctor. If incase you run out the pain pills before your appointment with sport medicine doctor, please call our clinic for a refill. 3. Stop the following medications    ### Please stop your Ultracet that Dr. Phillips Odor prescribed to you.    ### Please stop your Loratadine.Marland Kitchen  4.New medication    Xyzal 5 mg oral at bedtime for your allergy   Tylenol #3 1 tab twice a day  5. Will arrange X ray of your right shoulder and back  6. Will give you Pneumococcal vaccine today in the clinic 7. Please keep the good follow up with your oncologist for your breast cancer F/U

## 2010-09-09 NOTE — Assessment & Plan Note (Signed)
Colonoscopy 2010 Mammogram  Yearly followed by her oncologist - will give her Pneumococcal vaccine today

## 2010-09-09 NOTE — Assessment & Plan Note (Signed)
Pt would like to have a different anti-histamine medication because the current does not have work for her well. - will prescribe Xyzal 5 mg po daily at bedtime

## 2010-09-09 NOTE — Assessment & Plan Note (Signed)
Pt is currently under her oncologist management. Pt is up to date on her mammogram.

## 2010-09-10 LAB — COMPLETE METABOLIC PANEL WITH GFR
AST: 16 U/L (ref 0–37)
Albumin: 4.3 g/dL (ref 3.5–5.2)
BUN: 17 mg/dL (ref 6–23)
CO2: 27 mEq/L (ref 19–32)
Calcium: 9.7 mg/dL (ref 8.4–10.5)
Chloride: 99 mEq/L (ref 96–112)
Creat: 0.96 mg/dL (ref 0.50–1.10)
GFR, Est African American: >60 mL/min (ref >60)
GFR, Est Non African American: 57 mL/min — ABNORMAL LOW (ref >60)
Glucose, Bld: 104 mg/dL — ABNORMAL HIGH (ref 70–99)
Potassium: 3.8 mEq/L (ref 3.5–5.3)

## 2010-09-10 NOTE — Progress Notes (Signed)
I saw patient and discussed her care with resident Dr. Dierdre Searles.  I agree with the clinical findings and plans as outlined in her note.

## 2010-09-30 ENCOUNTER — Encounter: Payer: Medicare Other | Admitting: Internal Medicine

## 2010-10-08 ENCOUNTER — Ambulatory Visit: Payer: Medicare Other | Admitting: Family Medicine

## 2010-10-22 ENCOUNTER — Encounter (INDEPENDENT_AMBULATORY_CARE_PROVIDER_SITE_OTHER): Payer: Medicare Other | Admitting: Ophthalmology

## 2010-10-22 DIAGNOSIS — H43819 Vitreous degeneration, unspecified eye: Secondary | ICD-10-CM

## 2010-10-22 DIAGNOSIS — H3560 Retinal hemorrhage, unspecified eye: Secondary | ICD-10-CM

## 2010-10-22 DIAGNOSIS — H35039 Hypertensive retinopathy, unspecified eye: Secondary | ICD-10-CM

## 2010-10-22 DIAGNOSIS — H348392 Tributary (branch) retinal vein occlusion, unspecified eye, stable: Secondary | ICD-10-CM

## 2010-11-01 ENCOUNTER — Ambulatory Visit (INDEPENDENT_AMBULATORY_CARE_PROVIDER_SITE_OTHER): Payer: Medicare Other | Admitting: Family Medicine

## 2010-11-01 VITALS — BP 140/83

## 2010-11-01 DIAGNOSIS — M25511 Pain in right shoulder: Secondary | ICD-10-CM

## 2010-11-01 DIAGNOSIS — M25519 Pain in unspecified shoulder: Secondary | ICD-10-CM

## 2010-11-01 NOTE — Progress Notes (Signed)
  Subjective:    Patient ID: [name], female    DOB: 02-05-1936, 75 y.o.   MRN: [id number]  HPI  Right shoulder pain increasing over the last several months. Was started on Voltaren gel topically and some Ultracet and some exercises. Has improved about 50% in her pain. Still has pain with overhead motions. 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 Pain is in the right shoulder worse with certain motions. Does not radiate. Has no history of shoulder injury, no shoulder surgery.  Review of Systems Pertinent review of systems: negative for fever or unusual weight change.     Objective:   Physical Exam gen wd OBESE nad RIGHT SHOULDER: from. + Positive impingement signs. Mild pain with supraspinatus rate resisted motion. No shoulder defects. Distally neurovascularly intact. INJECTION: Patient was given informed consent, signed copy in the chart. Appropriate time out was taken. Area prepped and draped in usual sterile fashion. One cc of kenalog plus  4 cc of lidocaine was injected into the right shoulder using a(n) posterior approach. The patient tolerated the procedure well. There were no complications. Post procedure instructions were given.        Assessment & Plan:  Right shoulder pain. Reviewed her x-rays which show a lot of glenohumeral arthritis in the a.c. arthritis. Her symptoms seemed a more consistent with impingement. She has had significant improvement with prior treatment. We discussed options and ultimately gave her corticosteroid injection in the subacromial bursa today. I have given her handout for rotator cuff exercises. She will followup when necessary

## 2010-11-03 ENCOUNTER — Encounter (INDEPENDENT_AMBULATORY_CARE_PROVIDER_SITE_OTHER): Payer: Medicare Other | Admitting: Ophthalmology

## 2010-11-03 ENCOUNTER — Telehealth: Payer: Self-pay | Admitting: *Deleted

## 2010-11-03 NOTE — Telephone Encounter (Signed)
Pt called to let you know she went to therapy, sports med.  and was  told she had arthritis in arm.  She said she was told that maybe Dr Dierdre Searles would give her pain meds for that arm. Please advise.  Pt # [id number]

## 2010-11-05 ENCOUNTER — Other Ambulatory Visit: Payer: Self-pay | Admitting: Family Medicine

## 2010-11-05 DIAGNOSIS — Z853 Personal history of malignant neoplasm of breast: Secondary | ICD-10-CM

## 2010-11-05 DIAGNOSIS — Z9889 Other specified postprocedural states: Secondary | ICD-10-CM

## 2010-11-10 NOTE — Telephone Encounter (Signed)
Pt has scheduled appointment, meds called to pharmacy per Dr Dierdre Searles

## 2010-11-25 ENCOUNTER — Encounter: Payer: Self-pay | Admitting: Internal Medicine

## 2010-11-25 ENCOUNTER — Ambulatory Visit (INDEPENDENT_AMBULATORY_CARE_PROVIDER_SITE_OTHER): Payer: Medicare Other | Admitting: Internal Medicine

## 2010-11-25 DIAGNOSIS — M25519 Pain in unspecified shoulder: Secondary | ICD-10-CM

## 2010-11-25 DIAGNOSIS — E785 Hyperlipidemia, unspecified: Secondary | ICD-10-CM

## 2010-11-25 DIAGNOSIS — M25511 Pain in right shoulder: Secondary | ICD-10-CM

## 2010-11-25 DIAGNOSIS — I10 Essential (primary) hypertension: Secondary | ICD-10-CM

## 2010-11-25 DIAGNOSIS — Z23 Encounter for immunization: Secondary | ICD-10-CM

## 2010-11-25 MED ORDER — ACETAMINOPHEN-CODEINE 300-30 MG PO TABS: 1.0000 | ORAL_TABLET | Freq: Two times a day (BID) | ORAL | Status: DC | PRN

## 2010-11-25 NOTE — ED Provider Notes (Signed)
Order(s) created erroneously. Erroneous order ID: [id number] Order moved by: PETERS, MARSHA Order move date/time: 11/25/2010  5:22 PM Source Patient:    [id number] Source Contact: 11/25/2010 Destination Patient:   [id number] Destination Contact: 10/22/2010

## 2010-11-25 NOTE — Assessment & Plan Note (Signed)
Pt has had steroids injection to her right shoulder by Dr. Jennette Kettle from sports medicine. Pt is pain free now. - instructed her to continue to follow up with sports medicine -Tylenol #3 as needed for breakthrough pain.

## 2010-11-25 NOTE — Assessment & Plan Note (Signed)
BP stable and will continue the same regimen

## 2010-11-25 NOTE — Assessment & Plan Note (Signed)
Pt has been taking Zocor since last visit, and tolerating well. Her 10 years risk is 8% based on ATPIII cholesterol risk calculator. Her goal for LDL is <130 based on ATPIII cholesterol guideline. - will continue the current regimen - will repeat lipid panel in 6 months - will monitor CMP in next visit.

## 2010-11-25 NOTE — Patient Instructions (Addendum)
1. Follow up with me in November 2. Please see sport doctor as need for your shoulder pain. 3. Lose 5 Lbs by next office visit. 4. You have had flu shot and tetanus shot during this visit. 5. Will check you CMP next visit

## 2010-11-25 NOTE — Progress Notes (Signed)
Subjective:    Patient ID: [name], female    DOB: Dec 27, 1935, 75 y.o.   MRN: [id number]  HPI This is a 75 year-old pleasant lady with PMH of HTN, HLD, obesity and chronic right should pain who presents to the clinic for routine check up and medication refill. Pt states that she feels good. No c/o pain or discomfort. She reports that she went to see Dr. Jennette Kettle in sports clinic and got a corticosteroid injection to her right shoulder on 11/01/2010, which largely relieved her pain. She only takes Tylenol # 3 as needed for breakthrough pain. She would like a refill of Tylenol #3 during this visit.  I will also give her flu and tetanus shot.    Review of Systems   No headache, fever, or sore throat. No shortness of breath or dyspnea on exertion. No chest pain, chest pressure or palpitation No nausea, vomiting, or abdominal pain. No melena, diarrhea or incontinence. No muscle weakness.                   Denies depression. No appetite or weight changes.   Allergies  Allergen Reactions  . Other Rash    pickles   Current Outpatient Prescriptions on File Prior to Visit  Medication Sig Dispense Refill  . amLODipine (NORVASC) 10 MG tablet Take 1 tablet (10 mg total) by mouth daily.  90 tablet  10  . enalapril (VASOTEC) 20 MG tablet Take 1 tablet (20 mg total) by mouth 2 (two) times daily.  180 tablet  5  . levocetirizine (XYZAL) 5 MG tablet Take 1 tablet (5 mg total) by mouth every evening. Please take it at night time  90 tablet  4  . simvastatin (ZOCOR) 20 MG tablet Take 1 tablet (20 mg total) by mouth at bedtime.  90 tablet  4  . triamterene-hydrochlorothiazide (MAXZIDE-25) 37.5-25 MG per tablet Take 1 tablet by mouth daily.  90 tablet  4   Past Medical History  Diagnosis Date  . Hypothyroidism   . Lumbago   . Hyperlipidemia   . Hx of breast cancer   . Allergic rhinitis, cause unspecified    No past surgical history on file. History   Social History  . Marital Status: Legally  Separated    Spouse Name: N/A    Number of Children: N/A  . Years of Education: N/A   Occupational History  . not working    Social History Main Topics  . Smoking status: Former Smoker    Quit date: 09/09/1958  . Smokeless tobacco: Never Used  . Alcohol Use: No  . Drug Use: No  . Sexually Active: Not on file   Other Topics Concern  . Not on file   Social History Narrative  . No narrative on file   Family History  Problem Relation Age of Onset  . Diabetes Mother   . Hypertension Mother   . Alzheimer's disease Mother        Objective:   Physical Exam General: alert, well-developed, and cooperative to examination. obesity Head: normocephalic and atraumatic.  Eyes: vision grossly intact, pupils equal, pupils round, pupils reactive to light, no injection and anicteric.  Mouth: pharynx pink and moist, no erythema, and no exudates.  Neck: supple, full ROM, no thyromegaly, no JVD, and no carotid bruits.  Lungs: normal respiratory effort, no accessory muscle use, normal breath sounds, no crackles, and no wheezes. Heart: normal rate, regular rhythm, no murmur, no gallop, and no rub.  Abdomen:  soft, non-tender, normal bowel sounds, no distention, no guarding, no rebound tenderness, no hepatomegaly, and no splenomegaly.  Msk: no joint swelling, no joint warmth, and no redness over joints.  Pulses: 2+ DP/PT pulses bilaterally Extremities: No cyanosis, clubbing, edema Neurologic: alert & oriented X3, cranial nerves II-XII intact, strength normal in all extremities, sensation intact to light touch, and gait normal.  Skin: turgor normal and no rashes.  Psych: Oriented X3, memory intact for recent and remote, normally interactive, good eye contact, not anxious appearing, and not depressed appearing.     Assessment & Plan:

## 2010-11-26 LAB — DRUGS OF ABUSE SCREEN W/O ALC, ROUTINE URINE
Amphetamine Screen, Ur: NEGATIVE
Cocaine Metabolites: NEGATIVE
Creatinine,U: 47 mg/dL
Opiate Screen, Urine: POSITIVE — AB
Phencyclidine (PCP): NEGATIVE

## 2010-11-26 NOTE — Progress Notes (Signed)
I discussed patient with resident Dr. Li, and I agree with the plans as outlined in her note. 

## 2010-11-30 LAB — OPIATE, QUANTITATIVE, URINE
Hydrocodone: NEGATIVE NG/ML
Hydromorphone - Total: NEGATIVE NG/ML
Morphine Urine: 144 NG/ML — ABNORMAL HIGH
Oxymorphone: NEGATIVE NG/ML

## 2011-01-05 ENCOUNTER — Other Ambulatory Visit: Payer: Self-pay | Admitting: *Deleted

## 2011-01-05 DIAGNOSIS — M25511 Pain in right shoulder: Secondary | ICD-10-CM

## 2011-01-05 NOTE — Telephone Encounter (Signed)
Last refill 9/20

## 2011-01-07 MED ORDER — ACETAMINOPHEN-CODEINE 300-30 MG PO TABS: 1.0000 | ORAL_TABLET | Freq: Two times a day (BID) | ORAL | Status: DC | PRN

## 2011-01-07 NOTE — Telephone Encounter (Signed)
Rx called in and pt informed 

## 2011-01-19 ENCOUNTER — Ambulatory Visit
Admission: RE | Admit: 2011-01-19 | Discharge: 2011-01-19 | Disposition: A | Discharge status: Home / Self Care | Payer: Medicare Other | Source: Ambulatory Visit | Attending: Family Medicine | Admitting: Family Medicine

## 2011-01-19 DIAGNOSIS — Z9889 Other specified postprocedural states: Secondary | ICD-10-CM

## 2011-01-19 DIAGNOSIS — Z853 Personal history of malignant neoplasm of breast: Secondary | ICD-10-CM

## 2011-02-17 ENCOUNTER — Encounter: Payer: Medicare Other | Admitting: Internal Medicine

## 2011-02-21 ENCOUNTER — Ambulatory Visit (INDEPENDENT_AMBULATORY_CARE_PROVIDER_SITE_OTHER): Payer: Medicare Other | Admitting: Ophthalmology

## 2011-03-09 ENCOUNTER — Ambulatory Visit (INDEPENDENT_AMBULATORY_CARE_PROVIDER_SITE_OTHER): Payer: Medicare Other | Admitting: Ophthalmology

## 2011-03-09 DIAGNOSIS — H43819 Vitreous degeneration, unspecified eye: Secondary | ICD-10-CM

## 2011-03-09 DIAGNOSIS — H348392 Tributary (branch) retinal vein occlusion, unspecified eye, stable: Secondary | ICD-10-CM

## 2011-03-09 DIAGNOSIS — H35039 Hypertensive retinopathy, unspecified eye: Secondary | ICD-10-CM

## 2011-03-09 DIAGNOSIS — I10 Essential (primary) hypertension: Secondary | ICD-10-CM

## 2011-03-09 DIAGNOSIS — H2510 Age-related nuclear cataract, unspecified eye: Secondary | ICD-10-CM

## 2011-03-14 ENCOUNTER — Ambulatory Visit (INDEPENDENT_AMBULATORY_CARE_PROVIDER_SITE_OTHER): Payer: Medicare Other | Admitting: Ophthalmology

## 2011-03-14 DIAGNOSIS — H35039 Hypertensive retinopathy, unspecified eye: Secondary | ICD-10-CM

## 2011-03-14 DIAGNOSIS — I10 Essential (primary) hypertension: Secondary | ICD-10-CM

## 2011-03-14 DIAGNOSIS — H43819 Vitreous degeneration, unspecified eye: Secondary | ICD-10-CM

## 2011-03-14 DIAGNOSIS — H348392 Tributary (branch) retinal vein occlusion, unspecified eye, stable: Secondary | ICD-10-CM

## 2011-04-05 ENCOUNTER — Encounter (INDEPENDENT_AMBULATORY_CARE_PROVIDER_SITE_OTHER): Payer: Medicare Other | Admitting: Ophthalmology

## 2011-04-05 DIAGNOSIS — H35039 Hypertensive retinopathy, unspecified eye: Secondary | ICD-10-CM

## 2011-04-05 DIAGNOSIS — H348392 Tributary (branch) retinal vein occlusion, unspecified eye, stable: Secondary | ICD-10-CM

## 2011-04-05 DIAGNOSIS — I10 Essential (primary) hypertension: Secondary | ICD-10-CM

## 2011-04-05 DIAGNOSIS — H2510 Age-related nuclear cataract, unspecified eye: Secondary | ICD-10-CM

## 2011-04-05 DIAGNOSIS — H43819 Vitreous degeneration, unspecified eye: Secondary | ICD-10-CM

## 2011-05-02 ENCOUNTER — Encounter (INDEPENDENT_AMBULATORY_CARE_PROVIDER_SITE_OTHER): Payer: Medicare Other | Admitting: Ophthalmology

## 2011-05-02 ENCOUNTER — Other Ambulatory Visit: Payer: Self-pay | Admitting: *Deleted

## 2011-05-02 DIAGNOSIS — H35039 Hypertensive retinopathy, unspecified eye: Secondary | ICD-10-CM

## 2011-05-02 DIAGNOSIS — I10 Essential (primary) hypertension: Secondary | ICD-10-CM

## 2011-05-02 DIAGNOSIS — H348392 Tributary (branch) retinal vein occlusion, unspecified eye, stable: Secondary | ICD-10-CM

## 2011-05-02 MED ORDER — ENALAPRIL MALEATE 20 MG PO TABS: 20.0000 mg | ORAL_TABLET | Freq: Two times a day (BID) | ORAL | Status: DC

## 2011-05-02 MED ORDER — AMLODIPINE BESYLATE 10 MG PO TABS: 10.0000 mg | ORAL_TABLET | Freq: Every day | ORAL | Status: DC

## 2011-05-02 MED ORDER — TRIAMTERENE-HCTZ 37.5-25 MG PO TABS: 1.0000 | ORAL_TABLET | Freq: Every day | ORAL | Status: DC

## 2011-05-02 NOTE — Telephone Encounter (Signed)
Pt is using mail-order pharmacy now.

## 2011-05-03 ENCOUNTER — Telehealth: Payer: Self-pay | Admitting: *Deleted

## 2011-05-03 NOTE — Telephone Encounter (Signed)
Enalapril, Amlodipine, and Maxzide-25 rxs called to [facility].

## 2011-05-19 ENCOUNTER — Ambulatory Visit (HOSPITAL_COMMUNITY)
Admission: RE | Admit: 2011-05-19 | Discharge: 2011-05-19 | Disposition: A | Discharge status: Home / Self Care | Payer: Medicare Other | Source: Ambulatory Visit | Attending: Internal Medicine | Admitting: Internal Medicine

## 2011-05-19 ENCOUNTER — Ambulatory Visit (INDEPENDENT_AMBULATORY_CARE_PROVIDER_SITE_OTHER): Payer: Medicare Other | Admitting: Internal Medicine

## 2011-05-19 ENCOUNTER — Encounter: Payer: Self-pay | Admitting: Internal Medicine

## 2011-05-19 VITALS — BP 118/77 | HR 83 | Temp 97.1°F | Ht 62.0 in | Wt 257.4 lb

## 2011-05-19 DIAGNOSIS — R059 Cough, unspecified: Secondary | ICD-10-CM | POA: Insufficient documentation

## 2011-05-19 DIAGNOSIS — I10 Essential (primary) hypertension: Secondary | ICD-10-CM

## 2011-05-19 DIAGNOSIS — R05 Cough: Secondary | ICD-10-CM | POA: Insufficient documentation

## 2011-05-19 DIAGNOSIS — K219 Gastro-esophageal reflux disease without esophagitis: Secondary | ICD-10-CM | POA: Insufficient documentation

## 2011-05-19 HISTORY — DX: Gastro-esophageal reflux disease without esophagitis: K21.9

## 2011-05-19 LAB — BASIC METABOLIC PANEL WITH GFR
BUN: 21 mg/dL (ref 6–23)
Calcium: 9.5 mg/dL (ref 8.4–10.5)
GFR, Est African American: 55 mL/min — ABNORMAL LOW
GFR, Est Non African American: 48 mL/min — ABNORMAL LOW
Potassium: 3.9 mEq/L (ref 3.5–5.3)
Sodium: 139 mEq/L (ref 135–145)

## 2011-05-19 LAB — CBC WITH DIFFERENTIAL/PLATELET
Eosinophils Absolute: 0.4 10*3/uL (ref 0.0–0.7)
Lymphocytes Relative: 27 % (ref 12–46)
Lymphs Abs: 2.4 10*3/uL (ref 0.7–4.0)
MCH: 26.4 pg (ref 26.0–34.0)
Neutro Abs: 5.4 10*3/uL (ref 1.7–7.7)
Neutrophils Relative %: 61 % (ref 43–77)
Platelets: 323 10*3/uL (ref 150–400)
RBC: 4.74 MIL/uL (ref 3.87–5.11)
WBC: 8.8 10*3/uL (ref 4.0–10.5)

## 2011-05-19 NOTE — Progress Notes (Signed)
Subjective:    Patient ID: [name], female    DOB: [birth year], 76 y.o.   MRN: [id number]  HPI This is a 76 year-old non smoking pleasant lady with PMH of HTN, HLD, obesity and chronic right should pain who presents to the clinic for cough x 3 weeks. Pt reports gradual onset cough , alternating between mild productive cough and dry cough for 3 weeks. She reports she coughs mainly during the day. She states that she cough more during the morning. Denies heart burn. Denies acid reflux. Denies discomfort feeling in her throat. Her cough has No association with meals. Denies any associated cold-like symptoms. Denies post nasal drip. Denies fever, night sweats or fatigue. Denies association with any environmental and animal factors. Denies sick contact and recent long distance travel.   Patient reports that she got some kind of cough syrup at drug store recommended by pharmacist with partial relief.  Of note, last pneumococcal shot was in July 2012. Patient had flu shot last year.    Allergies  Allergen Reactions  . Other Rash    pickles   Current Outpatient Prescriptions on File Prior to Visit  Medication Sig Dispense Refill  . Acetaminophen-Codeine (TYLENOL/CODEINE #3) 300-30 MG per tablet Take 1 tablet by mouth 2 (two) times daily as needed for pain (take 1 tab twice a day for your shoulder only as you need. Please notify the clinic about excess dizziness and drowsiness).  30 tablet  0  . amLODipine (NORVASC) 10 MG tablet Take 1 tablet (10 mg total) by mouth daily.  90 tablet  1  . levocetirizine (XYZAL) 5 MG tablet Take 1 tablet (5 mg total) by mouth every evening. Please take it at night time  90 tablet  4  . simvastatin (ZOCOR) 20 MG tablet Take 1 tablet (20 mg total) by mouth at bedtime.  90 tablet  4  . triamterene-hydrochlorothiazide (MAXZIDE-25) 37.5-25 MG per tablet Take 1 each (1 tablet total) by mouth daily.  90 tablet  1   Past Medical History  Diagnosis Date  . Hypothyroidism    . Lumbago   . Hyperlipidemia   . Hx of breast cancer   . Allergic rhinitis, cause unspecified    No past surgical history on file. History   Social History  . Marital Status: Legally Separated    Spouse Name: N/A    Number of Children: N/A  . Years of Education: N/A   Occupational History  . not working    Social History Main Topics  . Smoking status: Former Smoker    Quit date: 09/09/1958  . Smokeless tobacco: Never Used  . Alcohol Use: No  . Drug Use: No  . Sexually Active: Not on file   Other Topics Concern  . Not on file   Social History Narrative  . No narrative on file   Family History  Problem Relation Age of Onset  . Diabetes Mother   . Hypertension Mother   . Alzheimer's disease Mother    Review of system: See HPI    Objective:   Physical Exam General: alert, well-developed, and cooperative to examination. obesity Mouth: pharynx pink and moist, no erythema, and no exudates.  Neck: supple, full ROM, no thyromegaly, no JVD, and no carotid bruits.  Lungs: normal respiratory effort, no accessory muscle use, normal breath sounds, no crackles, and no wheezes. Heart: normal rate, regular rhythm, no murmur, no gallop, and no rub.  Abdomen: soft, non-tender, normal bowel sounds, no distention,  no guarding, no rebound tenderness, no hepatomegaly, and no splenomegaly.  Msk: no joint swelling, no joint warmth, and no redness over joints.  Pulses: 2+ DP/PT pulses bilaterally Extremities: No cyanosis, clubbing, edema Neurologic: alert & oriented X3,   Assessment & Plan:

## 2011-05-19 NOTE — Patient Instructions (Signed)
1. Follow up with the clinic in 2 weeks 2. Please have blood test today 3. plesae have CXR today 4. Please stop your enalapril 5. I will call you tomorrow to discuss with you about the test result

## 2011-05-20 MED ORDER — METOPROLOL SUCCINATE ER 25 MG PO TB24: 25.0000 mg | ORAL_TABLET | Freq: Every day | ORAL | Status: DC

## 2011-05-20 NOTE — Assessment & Plan Note (Addendum)
S: No c/o, states that her BP is well controlled. O see my assessment on progress note A/P - BMP indicates Cr 1.14 which is slightly elevated comparing to her baseline of 0.9 - will stop ACEI due to chronic cough without any organic abnormality noted.  - will add low dose BB Toprol XL for BP. - I think that her Cr will likely return to her baseline  - will follow up in 2 weeks and recheck her BMP

## 2011-05-20 NOTE — Progress Notes (Signed)
Rx called in 

## 2011-05-20 NOTE — Assessment & Plan Note (Signed)
S: chronic cough x 3 weeks. No other associated factors. O: Negative A/P: - CBC with diff normal - CXR no abnormality noted - will stop her ACEI - will follow up in 2 weeks

## 2011-05-26 ENCOUNTER — Other Ambulatory Visit: Payer: Self-pay | Admitting: *Deleted

## 2011-05-26 DIAGNOSIS — M25511 Pain in right shoulder: Secondary | ICD-10-CM

## 2011-05-26 NOTE — Telephone Encounter (Signed)
Pt states you and she talked about her using patches for pain along w/ ty#3 and she thought you were going to send a script to the pharmacy, i didn't see anything about them in the note, i may have missed it. Please advise

## 2011-05-30 ENCOUNTER — Encounter (INDEPENDENT_AMBULATORY_CARE_PROVIDER_SITE_OTHER): Payer: Medicare Other | Admitting: Ophthalmology

## 2011-05-30 DIAGNOSIS — I10 Essential (primary) hypertension: Secondary | ICD-10-CM

## 2011-05-30 DIAGNOSIS — H348392 Tributary (branch) retinal vein occlusion, unspecified eye, stable: Secondary | ICD-10-CM

## 2011-05-30 DIAGNOSIS — H35039 Hypertensive retinopathy, unspecified eye: Secondary | ICD-10-CM

## 2011-06-02 MED ORDER — ACETAMINOPHEN-CODEINE 300-30 MG PO TABS: 1.0000 | ORAL_TABLET | Freq: Two times a day (BID) | ORAL | Status: DC | PRN

## 2011-06-02 MED ORDER — LIDO-CAPSAICIN-MEN-METHYL SAL 0.5-0.035-5-20 % EX PTCH: 1.0000 | MEDICATED_PATCH | Freq: Two times a day (BID) | CUTANEOUS | Status: DC | PRN

## 2011-06-14 ENCOUNTER — Ambulatory Visit (INDEPENDENT_AMBULATORY_CARE_PROVIDER_SITE_OTHER): Payer: Medicare Other | Admitting: Internal Medicine

## 2011-06-14 ENCOUNTER — Encounter: Payer: Self-pay | Admitting: Internal Medicine

## 2011-06-14 VITALS — BP 121/78 | HR 95 | Temp 97.9°F | Ht 62.0 in | Wt 256.6 lb

## 2011-06-14 DIAGNOSIS — M25511 Pain in right shoulder: Secondary | ICD-10-CM

## 2011-06-14 DIAGNOSIS — I10 Essential (primary) hypertension: Secondary | ICD-10-CM

## 2011-06-14 DIAGNOSIS — R059 Cough, unspecified: Secondary | ICD-10-CM

## 2011-06-14 DIAGNOSIS — R05 Cough: Secondary | ICD-10-CM

## 2011-06-14 MED ORDER — PANTOPRAZOLE SODIUM 40 MG PO TBEC: 40.0000 mg | DELAYED_RELEASE_TABLET | Freq: Every day | ORAL | Status: DC

## 2011-06-14 MED ORDER — ACETAMINOPHEN-CODEINE 300-30 MG PO TABS: 1.0000 | ORAL_TABLET | Freq: Two times a day (BID) | ORAL | Status: DC | PRN

## 2011-06-14 NOTE — Progress Notes (Signed)
Patient ID: [name], female   DOB: [birth year], 76 y.o.   MRN: [id number]  Subjective:    Patient ID: [name], female    DOB: 08-18-1935, 76 y.o.   MRN: [id number]  HPI This is a 76 year-old non smoking pleasant lady with PMH of HTN, HLD, obesity and chronic right should pain who presents to the clinic for cough x 6 weeks.    Pt reports gradual onset cough, which alternates between mild productive cough and dry cough for 6 weeks. She states that she coughs more during the morning.  Denies any associated cold-like symptoms. Denies post nasal drip. Denies fever, night sweats or fatigue.  Denies heart burn. Denies acid reflux.  Her cough has had No association with meals. Denies association with any environmental and animal factors. Denies sick contact and recent long distance travel.   She came to the clinic 3 weeks ago and had a Negative chest X ray.  We have stopped her Vasotec since ACEI can typically cause chronic cough. Patient reports that she still has similar cough, which is unchanged for past 6 weeks. She states that she has some "scratchy feeling" in her throat every morning now.   She also reports skin itching after taking metoprolol. No rash or anaphylactic reaction. She stopped Metoprolol 2 weeks ago and her itching was completely resolved.    No headache, fever, or sore throat. No shortness of breath or dyspnea on exertion. No chest pain, chest pressure or palpitation No nausea, vomiting, or abdominal pain. No melena, diarrhea or incontinence. No muscle weakness.                   Denies depression. No appetite or weight changes.    Allergies  Allergen Reactions  . Metoprolol Itching  . Other Rash    pickles   Current Outpatient Prescriptions on File Prior to Visit  Medication Sig Dispense Refill  . amLODipine (NORVASC) 10 MG tablet Take 1 tablet (10 mg total) by mouth daily.  90 tablet  1  . levocetirizine (XYZAL) 5 MG tablet Take 1 tablet (5 mg total) by mouth every  evening. Please take it at night time  90 tablet  4  . Lido-Capsaicin-Men-Methyl Sal (MEDI-PATCH-LIDOCAINE) 0.5-0.035-5-20 % PTCH Apply 1 patch topically 2 (two) times daily as needed.  30 patch  0  . simvastatin (ZOCOR) 20 MG tablet Take 1 tablet (20 mg total) by mouth at bedtime.  90 tablet  4  . triamterene-hydrochlorothiazide (MAXZIDE-25) 37.5-25 MG per tablet Take 1 each (1 tablet total) by mouth daily.  90 tablet  1  . DISCONTD: Acetaminophen-Codeine (TYLENOL/CODEINE #3) 300-30 MG per tablet Take 1 tablet by mouth 2 (two) times daily as needed for pain (take 1 tab twice a day for your shoulder only as you need. Please notify the clinic about excess dizziness and drowsiness).  30 tablet  0  . pantoprazole (PROTONIX) 40 MG tablet Take 1 tablet (40 mg total) by mouth daily.  30 tablet  1   Past Medical History  Diagnosis Date  . Hypothyroidism   . Lumbago   . Hyperlipidemia   . Hx of breast cancer   . Allergic rhinitis, cause unspecified    No past surgical history on file. History   Social History  . Marital Status: Legally Separated    Spouse Name: N/A    Number of Children: N/A  . Years of Education: N/A   Occupational History  . not working  Social History Main Topics  . Smoking status: Former Smoker    Quit date: 09/09/1958  . Smokeless tobacco: Never Used  . Alcohol Use: No  . Drug Use: No  . Sexually Active: Not on file   Other Topics Concern  . Not on file   Social History Narrative  . No narrative on file   Family History  Problem Relation Age of Onset  . Diabetes Mother   . Hypertension Mother   . Alzheimer's disease Mother    Review of system: See HPI    Objective:   Physical Exam General: alert, well-developed, and cooperative to examination. obesity Mouth: pharynx pink and moist, no erythema, and no exudates.  Neck: supple, full ROM, no thyromegaly, no JVD, and no carotid bruits.  Lungs: normal respiratory effort, no accessory muscle use,  normal breath sounds, no crackles, and no wheezes. Heart: normal rate, regular rhythm, no murmur, no gallop, and no rub.  Abdomen: soft, non-tender, normal bowel sounds, no distention, no guarding, no rebound tenderness, no hepatomegaly, and no splenomegaly.  Msk: no joint swelling, no joint warmth, and no redness over joints.  Pulses: 2+ DP/PT pulses bilaterally Extremities: No cyanosis, clubbing, edema Neurologic: alert & oriented X3,   Assessment & Plan:

## 2011-06-14 NOTE — Assessment & Plan Note (Signed)
Her Vasotec was stopped due to concerns over her chronic cough during last visit. Her cough has not changed, which indicates that ACEI is unlike to be the etiology of her cough.    She was started on Metoprolol which caused skin itching per pt who stopped it two weeks ago. Patient's BP is under good control now.  - will recheck her BMP  - if Cr. Level permits, will add Vasotec 20 mg po BID back to her regimen

## 2011-06-14 NOTE — Patient Instructions (Signed)
1. Follow up  two weeks 2. Add protonix to your regimen

## 2011-06-14 NOTE — Assessment & Plan Note (Signed)
S: mainly dry cough for 6 weeks. Reports throat discomfort in the morning.  Patient feels well otherwise O: no significant findings A: Infectious etiology is less likely given the clinical picture.   And No change of cough after stopping ACEI.   P:  -will prescribe PPI -instruct patient that it may take up to 3 months for PPI to work - will see her in 3 months

## 2011-06-15 LAB — BASIC METABOLIC PANEL WITH GFR
BUN: 13 mg/dL (ref 6–23)
CO2: 28 mEq/L (ref 19–32)
Calcium: 9 mg/dL (ref 8.4–10.5)
Chloride: 99 mEq/L (ref 96–112)
Creat: 1.03 mg/dL (ref 0.50–1.10)
GFR, Est African American: 62 mL/min
GFR, Est Non African American: 54 mL/min — ABNORMAL LOW
Glucose, Bld: 127 mg/dL — ABNORMAL HIGH (ref 70–99)
Potassium: 3.3 mEq/L — ABNORMAL LOW (ref 3.5–5.3)
Sodium: 138 mEq/L (ref 135–145)

## 2011-06-16 ENCOUNTER — Other Ambulatory Visit: Payer: Self-pay | Admitting: Internal Medicine

## 2011-06-16 MED ORDER — POTASSIUM CHLORIDE ER 10 MEQ PO TBCR: 40.0000 meq | EXTENDED_RELEASE_TABLET | Freq: Two times a day (BID) | ORAL | Status: DC

## 2011-06-17 ENCOUNTER — Other Ambulatory Visit: Payer: Self-pay | Admitting: *Deleted

## 2011-06-20 ENCOUNTER — Encounter: Payer: Self-pay | Admitting: *Deleted

## 2011-06-20 NOTE — Progress Notes (Unsigned)
Call from [facility] on Friday 4/12 for clarification on dose of potassium.  I called in Kcl 10 meq tablets, take 4 tabs by mouth once a day #8. Per Dr Josem Kaufmann.  Checked with pt on Monday and she picked up the Rx and took total of 8 tables of Kcl over 2 days, 4 each day. Dr Dierdre Searles informed.

## 2011-06-26 ENCOUNTER — Inpatient Hospital Stay (HOSPITAL_COMMUNITY)
Admission: EM | Admit: 2011-06-26 | Discharge: 2011-06-30 | DRG: 194 | Disposition: A | Discharge status: Home / Self Care | Payer: Medicare Other | Attending: Internal Medicine | Admitting: Internal Medicine

## 2011-06-26 ENCOUNTER — Emergency Department (HOSPITAL_COMMUNITY): Payer: Medicare Other

## 2011-06-26 ENCOUNTER — Encounter (HOSPITAL_COMMUNITY): Payer: Self-pay | Admitting: Emergency Medicine

## 2011-06-26 DIAGNOSIS — J189 Pneumonia, unspecified organism: Principal | ICD-10-CM | POA: Diagnosis present

## 2011-06-26 DIAGNOSIS — E669 Obesity, unspecified: Secondary | ICD-10-CM | POA: Diagnosis present

## 2011-06-26 DIAGNOSIS — N179 Acute kidney failure, unspecified: Secondary | ICD-10-CM | POA: Diagnosis present

## 2011-06-26 DIAGNOSIS — Z87891 Personal history of nicotine dependence: Secondary | ICD-10-CM

## 2011-06-26 DIAGNOSIS — Z6841 Body Mass Index (BMI) 40.0 and over, adult: Secondary | ICD-10-CM

## 2011-06-26 DIAGNOSIS — K219 Gastro-esophageal reflux disease without esophagitis: Secondary | ICD-10-CM | POA: Diagnosis present

## 2011-06-26 DIAGNOSIS — I10 Essential (primary) hypertension: Secondary | ICD-10-CM | POA: Diagnosis present

## 2011-06-26 DIAGNOSIS — R509 Fever, unspecified: Secondary | ICD-10-CM

## 2011-06-26 DIAGNOSIS — E785 Hyperlipidemia, unspecified: Secondary | ICD-10-CM | POA: Diagnosis present

## 2011-06-26 DIAGNOSIS — E876 Hypokalemia: Secondary | ICD-10-CM | POA: Diagnosis present

## 2011-06-26 HISTORY — DX: Essential (primary) hypertension: I10

## 2011-06-26 LAB — COMPREHENSIVE METABOLIC PANEL
CO2: 26 mEq/L (ref 19–32)
Calcium: 8.9 mg/dL (ref 8.4–10.5)
Creatinine, Ser: 1.27 mg/dL — ABNORMAL HIGH (ref 0.50–1.10)
GFR calc Af Amer: 47 mL/min — ABNORMAL LOW (ref >90)
GFR calc non Af Amer: 41 mL/min — ABNORMAL LOW (ref >90)
Glucose, Bld: 158 mg/dL — ABNORMAL HIGH (ref 70–99)
Total Bilirubin: 0.9 mg/dL (ref 0.3–1.2)

## 2011-06-26 LAB — URINALYSIS, ROUTINE W REFLEX MICROSCOPIC
Bilirubin Urine: NEGATIVE
Glucose, UA: NEGATIVE mg/dL
Ketones, ur: NEGATIVE mg/dL
Leukocytes, UA: NEGATIVE
Nitrite: NEGATIVE
Protein, ur: 100 mg/dL — AB
Specific Gravity, Urine: 1.017 (ref 1.005–1.030)
Urobilinogen, UA: 0.2 mg/dL (ref 0.0–1.0)
pH: 6.5 (ref 5.0–8.0)

## 2011-06-26 LAB — CBC
HCT: 37 % (ref 36.0–46.0)
Hemoglobin: 12.1 g/dL (ref 12.0–15.0)
MCV: 80.6 fL (ref 78.0–100.0)
RBC: 4.59 MIL/uL (ref 3.87–5.11)
WBC: 16.6 10*3/uL — ABNORMAL HIGH (ref 4.0–10.5)

## 2011-06-26 LAB — URINE MICROSCOPIC-ADD ON

## 2011-06-26 LAB — DIFFERENTIAL
Eosinophils Relative: 0 % (ref 0–5)
Lymphocytes Relative: 10 % — ABNORMAL LOW (ref 12–46)
Lymphs Abs: 1.6 10*3/uL (ref 0.7–4.0)
Monocytes Absolute: 1.2 10*3/uL — ABNORMAL HIGH (ref 0.1–1.0)
Monocytes Relative: 7 % (ref 3–12)

## 2011-06-26 LAB — LACTIC ACID, PLASMA: Lactic Acid, Venous: 1.7 mmol/L (ref 0.5–2.2)

## 2011-06-26 MED ORDER — POTASSIUM CHLORIDE CRYS ER 20 MEQ PO TBCR
60.0000 meq | EXTENDED_RELEASE_TABLET | Freq: Once | ORAL | Status: AC
  Administered 2011-06-26: 60 meq via ORAL
  Filled 2011-06-26: qty 3

## 2011-06-26 MED ORDER — ACETAMINOPHEN 325 MG PO TABS
650.0000 mg | ORAL_TABLET | Freq: Once | ORAL | Status: AC
  Administered 2011-06-26: 650 mg via ORAL
  Filled 2011-06-26: qty 2

## 2011-06-26 MED ORDER — SODIUM CHLORIDE 0.9 % IV SOLN
Freq: Once | INTRAVENOUS | Status: AC
  Administered 2011-06-26: 22:00:00 via INTRAVENOUS

## 2011-06-26 NOTE — ED Notes (Signed)
Pt states she has been coughing, headache on the right side  Pt states she gets short of breath with cough  Pt states sxs started on Friday  Pt is in no acute distress in triage  Family states she started having wheezing today

## 2011-06-26 NOTE — ED Notes (Signed)
Pt presented to the and that's that sx are about week now however wheezing started this morning, pt denies Hx of Asthma.

## 2011-06-26 NOTE — ED Provider Notes (Signed)
History     CSN: [id number]  Arrival date & time 06/26/11  4098   First MD Initiated Contact with Patient 06/26/11 2111      Chief Complaint  Patient presents with  . Cough  . Shortness of Breath  . Nasal Congestion  . Wheezing    (Consider location/radiation/quality/duration/timing/severity/associated sxs/prior treatment) Patient is a 76 y.o. female presenting with cough, shortness of breath, and wheezing. The history is provided by the patient.  Cough Associated symptoms include shortness of breath and wheezing.  Shortness of Breath  Associated symptoms include cough, shortness of breath and wheezing.  Wheezing  Associated symptoms include cough, shortness of breath and wheezing.   patient here with cough x1 week with shortness of breath. Fever x2 days. No vomiting or diarrhea. Some dysuria and frequency. No back pain or rashes. No medications taken prior to arrival. Denies any headache or photophobia or neck pain. No chest pain. No abdominal pain. Nothing makes her symptoms worse  Past Medical History  Diagnosis Date  . Hypothyroidism   . Lumbago   . Hyperlipidemia   . Hx of breast cancer   . Allergic rhinitis, cause unspecified   . Hypertension     Past Surgical History  Procedure Date  . Total hip arthroplasty   . Breast lumpectomy   . Abdominal hysterectomy     Family History  Problem Relation Age of Onset  . Hypertension Mother   . Alzheimer's disease Mother   . Diabetes Sister     History  Substance Use Topics  . Smoking status: Former Smoker    Quit date: 09/09/1958  . Smokeless tobacco: Never Used  . Alcohol Use: No    OB History    Grav Para Term Preterm Abortions TAB SAB Ect Mult Living                  Review of Systems  Respiratory: Positive for cough, shortness of breath and wheezing.   All other systems reviewed and are negative.    Allergies  Metoprolol and Other  Home Medications   Current Outpatient Rx  Name Route Sig  Dispense Refill  . ACETAMINOPHEN-CODEINE 300-30 MG PO TABS Oral Take 1 tablet by mouth 2 (two) times daily as needed for pain (take 1 tab twice a day for your shoulder only as you need. Please notify the clinic about excess dizziness and drowsiness). 30 tablet 0  . AMLODIPINE BESYLATE 10 MG PO TABS Oral Take 1 tablet (10 mg total) by mouth daily. 90 tablet 1  . LEVOCETIRIZINE DIHYDROCHLORIDE 5 MG PO TABS Oral Take 1 tablet (5 mg total) by mouth every evening. Please take it at night time 90 tablet 4  . PANTOPRAZOLE SODIUM 40 MG PO TBEC Oral Take 1 tablet (40 mg total) by mouth daily. 30 tablet 1  . POTASSIUM CHLORIDE ER 10 MEQ PO TBCR Oral Take 4 tablets (40 mEq total) by mouth 2 (two) times daily. 4 tablet 0  . SIMVASTATIN 20 MG PO TABS Oral Take 1 tablet (20 mg total) by mouth at bedtime. 90 tablet 4  . TRIAMTERENE-HCTZ 37.5-25 MG PO TABS Oral Take 1 each (1 tablet total) by mouth daily. 90 tablet 1  . LIDO-CAPSAICIN-MEN-METHYL SAL 0.5-0.035-5-20 % EX PTCH Apply externally Apply 1 patch topically 2 (two) times daily as needed. 30 patch 0    BP 137/77  Pulse 114  Temp(Src) 102.4 F (39.1 C) (Oral)  Resp 20  SpO2 92%  Physical Exam  Nursing note  and vitals reviewed. Constitutional: She is oriented to person, place, and time. She appears well-developed and well-nourished.  Non-toxic appearance. No distress.  HENT:  Head: Normocephalic and atraumatic.  Eyes: Conjunctivae, EOM and lids are normal. Pupils are equal, round, and reactive to light.  Neck: Normal range of motion. Neck supple. No tracheal deviation present. No mass present.  Cardiovascular: Regular rhythm and normal heart sounds.  Tachycardia present.  Exam reveals no gallop.   No murmur heard. Pulmonary/Chest: Effort normal. No stridor. No respiratory distress. She has decreased breath sounds. She has no wheezes. She has no rhonchi. She has no rales.  Abdominal: Soft. Normal appearance and bowel sounds are normal. She exhibits no  distension. There is no tenderness. There is no rebound and no CVA tenderness.  Musculoskeletal: Normal range of motion. She exhibits no edema and no tenderness.  Neurological: She is alert and oriented to person, place, and time. She has normal strength. No cranial nerve deficit or sensory deficit. GCS eye subscore is 4. GCS verbal subscore is 5. GCS motor subscore is 6.  Skin: Skin is warm and dry. No abrasion and no rash noted.  Psychiatric: She has a normal mood and affect. Her speech is normal and behavior is normal.    ED Course  Procedures (including critical care time)   Labs Reviewed  CBC  DIFFERENTIAL  COMPREHENSIVE METABOLIC PANEL  CULTURE, BLOOD (ROUTINE X 2)  CULTURE, BLOOD (ROUTINE X 2)  LACTIC ACID, PLASMA  URINE CULTURE  URINALYSIS, ROUTINE W REFLEX MICROSCOPIC   No results found.   No diagnosis found.    MDM  pts fever treated, no obvious source of infection, blood cx pending, hypokalemia tx, will transfer to [facility] for admission        Toy Baker, MD 06/27/11 0003

## 2011-06-27 ENCOUNTER — Encounter (INDEPENDENT_AMBULATORY_CARE_PROVIDER_SITE_OTHER): Payer: Medicare Other | Admitting: Ophthalmology

## 2011-06-27 ENCOUNTER — Encounter (HOSPITAL_COMMUNITY): Payer: Self-pay | Admitting: Internal Medicine

## 2011-06-27 DIAGNOSIS — R0602 Shortness of breath: Secondary | ICD-10-CM

## 2011-06-27 DIAGNOSIS — R509 Fever, unspecified: Secondary | ICD-10-CM | POA: Diagnosis present

## 2011-06-27 DIAGNOSIS — N179 Acute kidney failure, unspecified: Secondary | ICD-10-CM

## 2011-06-27 DIAGNOSIS — E876 Hypokalemia: Secondary | ICD-10-CM | POA: Diagnosis present

## 2011-06-27 LAB — BASIC METABOLIC PANEL
BUN: 13 mg/dL (ref 6–23)
CO2: 25 mEq/L (ref 19–32)
Calcium: 8.6 mg/dL (ref 8.4–10.5)
GFR calc non Af Amer: 44 mL/min — ABNORMAL LOW (ref >90)
Glucose, Bld: 217 mg/dL — ABNORMAL HIGH (ref 70–99)

## 2011-06-27 LAB — RAPID URINE DRUG SCREEN, HOSP PERFORMED
Amphetamines: NOT DETECTED
Barbiturates: NOT DETECTED
Benzodiazepines: NOT DETECTED
Cocaine: NOT DETECTED
Opiates: POSITIVE — AB
Tetrahydrocannabinol: NOT DETECTED

## 2011-06-27 LAB — LIPID PANEL
Cholesterol: 145 mg/dL (ref 0–200)
HDL: 44 mg/dL (ref >39)
Total CHOL/HDL Ratio: 3.3 RATIO
Triglycerides: 59 mg/dL (ref <150)

## 2011-06-27 LAB — CREATININE, URINE, RANDOM: Creatinine, Urine: 155.7 mg/dL

## 2011-06-27 LAB — MICROALBUMIN / CREATININE URINE RATIO: Creatinine, Urine: 168.2 mg/dL

## 2011-06-27 LAB — HEMOGLOBIN A1C: Mean Plasma Glucose: 134 mg/dL — ABNORMAL HIGH (ref <117)

## 2011-06-27 LAB — CBC
HCT: 33.6 % — ABNORMAL LOW (ref 36.0–46.0)
MCH: 26.2 pg (ref 26.0–34.0)
MCHC: 32.7 g/dL (ref 30.0–36.0)
MCV: 80 fL (ref 78.0–100.0)
Platelets: 210 10*3/uL (ref 150–400)
RBC: 4.2 MIL/uL (ref 3.87–5.11)

## 2011-06-27 LAB — MAGNESIUM: Magnesium: 1.9 mg/dL (ref 1.5–2.5)

## 2011-06-27 LAB — TSH: TSH: 5.628 u[IU]/mL — ABNORMAL HIGH (ref 0.350–4.500)

## 2011-06-27 MED ORDER — SIMVASTATIN 20 MG PO TABS
20.0000 mg | ORAL_TABLET | Freq: Every day | ORAL | Status: DC
  Administered 2011-06-27 – 2011-06-29 (×3): 20 mg via ORAL
  Filled 2011-06-27 (×4): qty 1

## 2011-06-27 MED ORDER — SODIUM CHLORIDE 0.9 % IV SOLN
INTRAVENOUS | Status: AC
  Administered 2011-06-27 – 2011-06-28 (×3): via INTRAVENOUS

## 2011-06-27 MED ORDER — ACETAMINOPHEN 650 MG RE SUPP: 650.0000 mg | Freq: Four times a day (QID) | RECTAL | Status: DC | PRN

## 2011-06-27 MED ORDER — LEVOFLOXACIN 500 MG PO TABS
500.0000 mg | ORAL_TABLET | Freq: Every day | ORAL | Status: DC
  Administered 2011-06-27 – 2011-06-30 (×4): 500 mg via ORAL
  Filled 2011-06-27 (×5): qty 1

## 2011-06-27 MED ORDER — HEPARIN SODIUM (PORCINE) 5000 UNIT/ML IJ SOLN
5000.0000 [IU] | Freq: Three times a day (TID) | INTRAMUSCULAR | Status: DC
  Administered 2011-06-27 – 2011-06-30 (×9): 5000 [IU] via SUBCUTANEOUS
  Filled 2011-06-27 (×13): qty 1

## 2011-06-27 MED ORDER — TRAMADOL HCL 50 MG PO TABS: 50.0000 mg | ORAL_TABLET | Freq: Two times a day (BID) | ORAL | Status: DC | PRN

## 2011-06-27 MED ORDER — GUAIFENESIN-DM 100-10 MG/5ML PO SYRP
5.0000 mL | ORAL_SOLUTION | ORAL | Status: DC | PRN
  Administered 2011-06-27 – 2011-06-29 (×3): 5 mL via ORAL
  Filled 2011-06-27 (×4): qty 5

## 2011-06-27 MED ORDER — ACETAMINOPHEN 325 MG PO TABS
650.0000 mg | ORAL_TABLET | Freq: Four times a day (QID) | ORAL | Status: DC | PRN
  Administered 2011-06-27 – 2011-06-29 (×5): 650 mg via ORAL
  Filled 2011-06-27 (×5): qty 2

## 2011-06-27 MED ORDER — ONDANSETRON HCL 4 MG PO TABS
4.0000 mg | ORAL_TABLET | Freq: Four times a day (QID) | ORAL | Status: DC | PRN
  Administered 2011-06-27: 4 mg via ORAL
  Filled 2011-06-27: qty 1

## 2011-06-27 MED ORDER — AMLODIPINE BESYLATE 10 MG PO TABS
10.0000 mg | ORAL_TABLET | Freq: Every day | ORAL | Status: DC
  Administered 2011-06-27 – 2011-06-30 (×4): 10 mg via ORAL
  Filled 2011-06-27 (×4): qty 1

## 2011-06-27 MED ORDER — POTASSIUM CHLORIDE CRYS ER 20 MEQ PO TBCR
40.0000 meq | EXTENDED_RELEASE_TABLET | ORAL | Status: AC
  Administered 2011-06-27 (×3): 40 meq via ORAL
  Filled 2011-06-27 (×3): qty 2

## 2011-06-27 MED ORDER — PANTOPRAZOLE SODIUM 40 MG PO TBEC
40.0000 mg | DELAYED_RELEASE_TABLET | Freq: Every day | ORAL | Status: DC
  Administered 2011-06-27 – 2011-06-30 (×4): 40 mg via ORAL
  Filled 2011-06-27 (×4): qty 1

## 2011-06-27 MED ORDER — ONDANSETRON HCL 4 MG/2ML IJ SOLN
4.0000 mg | Freq: Four times a day (QID) | INTRAMUSCULAR | Status: DC | PRN
  Administered 2011-06-28 – 2011-06-30 (×2): 4 mg via INTRAVENOUS
  Filled 2011-06-27 (×2): qty 2

## 2011-06-27 MED ORDER — ALBUTEROL SULFATE (5 MG/ML) 0.5% IN NEBU
2.5000 mg | INHALATION_SOLUTION | RESPIRATORY_TRACT | Status: DC | PRN
  Administered 2011-06-27 – 2011-06-30 (×7): 2.5 mg via RESPIRATORY_TRACT
  Filled 2011-06-27 (×7): qty 0.5

## 2011-06-27 MED ORDER — GUAIFENESIN-DM 100-10 MG/5ML PO SYRP: 5.0000 mL | ORAL_SOLUTION | ORAL | Status: DC | PRN

## 2011-06-27 NOTE — Progress Notes (Signed)
Pt alert and oriented. Introduced to staff and room. Oriented to bed and fall policy. Bed alarm in place. Red socks applied. No signs of respiratory distress. Family at bedside. Assessment charted.

## 2011-06-27 NOTE — H&P (Signed)
Internal Medicine Teaching Service Attending Note Date: 06/27/2011  Patient name: CARLISE STOFER  Medical record number: [id number]  Date of birth: 1935-03-11   I have seen and evaluated Benjaman Pott and discussed their care with the Residency Team.  Please see Dr Gardiner Sleeper note for full details. Ms Mairena has been seen twice, once in March and once in April, for a dry cough. Her ACEI was stopped at the first visit and triamterene / HCTZ was started but the cough persisted. At the second visit, she was Rx'd a PPI but never took it. For the past two weeks the cough has continued but also now with sore throat, fatigue, SOB, F/C, HA, subjective fever, & anroexia. Since getting KCl and IVF, she feels much better but still has a non productive cough. She endorses occassional GERD sxs. Her son has noticed irr breathing when asleep - occasional AM headache, ? snoring.   Before she got ill, she was able to ambulate in stores slowly with a cane but denied DOE. No falls. She does her cooking, laundry, etc.   She was able to tell me that she took 4 large pills over 4 days after the last appt (most likely were KCl). She cannot name names of drugs but endorses stopping hte BP med that she was told to and started the new tiny pill for BP.  She smoked a total of less than 5 years. Started about 30 or 22 and quit about age 32.  On exam, she is obese but interactive and doesn't nod off to sleep. Thick neck HRRR no MRG LCTAB with good air movement ABD benign Neuro - no focal  Assessment and Plan: I agree with the formulated Assessment and Plan with the following change 1. Febrile illness - spiked to 102.7 this AM. Initially CXR was negative but has now been hydrated. Rec checking blood cxs, repeat CXR, and start ABX. She has no signs / sxs of GI or GU source. No skin abnl. No CNS source/ Could all be resp viral but her ANC is elevated rather than her lymphocytes.   2. Cough - I am fairly confident that she stopped her  ACEI. Trial of PPI. Sill start ABX for presumed bacterial CAP. If continues to persist after ABX, CT chest. Check TSH.  3. ARF - liekly 2/2 volume contraction. IVF caused slight trend down.   4. Obesity - OSA and OHS unlikely as cause of cough but she is willing to consider a sleep study as out.   5. Hypokalemia - replete. Check Mag. Likely 2/2 HCTZ.   6. Dispo - needs 24 hours afebrile. Maybe the 23rd or 24th.

## 2011-06-27 NOTE — Progress Notes (Signed)
EKG results faxed to number requested by Dr. Candy Sledge

## 2011-06-27 NOTE — Progress Notes (Signed)
Reviewed Fall Prevention Plan. Copy left at bedside. Given guide book

## 2011-06-27 NOTE — Progress Notes (Signed)
Subjective: The patient was admitted overnight with a 33-month history of non-productive cough, with a 1-2 day history of sore throat, as well as fevers to 102.7 since admission.  She notes no nasal congestion, rhinorrhea, or sinus pain.  CXR showed no abnormality.  Patient saturating 92-96% on room air, so was placed on 2L [place].  Patient has had influenza vaccine and pneumovax within the last year.  The patient also notes some symptoms of heartburn and occasionally waking up with a sour taste in the back of her mouth.  Objective: Vital signs in last 24 hours: Filed Vitals:   06/27/11 0502 06/27/11 0612 06/27/11 0750 06/27/11 0934  BP: 130/70 127/76  117/65  Pulse:  109  76  Temp:  102.7 F (39.3 C) 100.9 F (38.3 C) 100.5 F (38.1 C)  TempSrc:  Oral Oral Oral  Resp: 22 20  20   Height:      Weight:      SpO2: 95% 100%  100%   Weight change:   Intake/Output Summary (Last 24 hours) at 06/27/11 1032 Last data filed at 06/27/11 0900  Gross per 24 hour  Intake 348.33 ml  Output      0 ml  Net 348.33 ml   Physical Exam: General: alert, cooperative, and in no apparent distress HEENT: pupils equal round and reactive to light, vision grossly intact, oropharynx mildly erythematous with no exudate  Neck: supple, no lymphadenopathy Lungs: clear to ascultation bilaterally, normal work of respiration, no wheezes, rales, ronchi Heart: regular rate and rhythm, no murmurs, gallops, or rubs Abdomen: soft, mild epigastric ttp, non-distended, normal bowel sounds Extremities: no cyanosis, clubbing, or edema Neurologic: alert & oriented X3, cranial nerves II-XII intact, strength grossly intact, sensation intact to light touch  Lab Results: Basic Metabolic Panel:  Lab 06/27/11 0865 06/26/11 2145  NA 133* 134*  K 2.6* 2.6*  CL 95* 93*  CO2 25 26  GLUCOSE 217* 158*  BUN 13 13  CREATININE 1.20* 1.27*  CALCIUM 8.6 8.9  MG 1.9 --  PHOS -- --   Liver Function Tests:  Lab 06/26/11 2145  AST 28   ALT 6  ALKPHOS 53  BILITOT 0.9  PROT 8.1  ALBUMIN 3.4*   CBC:  Lab 06/27/11 0725 06/26/11 2145  WBC 15.9* 16.6*  NEUTROABS -- 13.8*  HGB 11.0* 12.1  HCT 33.6* 37.0  MCV 80.0 80.6  PLT 210 241   Fasting Lipid Panel:  Lab 06/27/11 0725  CHOL 145  HDL 44  LDLCALC 89  TRIG 59  CHOLHDL 3.3  LDLDIRECT --   Urine Drug Screen: Drugs of Abuse     Component Value Date/Time   LABOPIA POSITIVE* 06/27/2011 0533   LABOPIA POS* 11/25/2010 1655   COCAINSCRNUR NONE DETECTED 06/27/2011 0533   COCAINSCRNUR NEG 11/25/2010 1655   LABBENZ NONE DETECTED 06/27/2011 0533   LABBENZ NEG 11/25/2010 1655   AMPHETMU NONE DETECTED 06/27/2011 0533   AMPHETMU NEG 11/25/2010 1655   THCU NONE DETECTED 06/27/2011 0533   LABBARB NONE DETECTED 06/27/2011 0533    Urinalysis:  Lab 06/26/11 2311  COLORURINE YELLOW  LABSPEC 1.017  PHURINE 6.5  GLUCOSEU NEGATIVE  HGBUR TRACE*  BILIRUBINUR NEGATIVE  KETONESUR NEGATIVE  PROTEINUR 100*  UROBILINOGEN 0.2  NITRITE NEGATIVE  LEUKOCYTESUR NEGATIVE    Studies/Results: Dg Chest 2 View  06/26/2011  *RADIOLOGY REPORT*  Clinical Data: Cough and fever for two or 3 days.  Weakness. Nonsmoker.  Nasal congestion and wheezing.  Shortness of breath.  CHEST -  2 VIEW  Comparison: 05/19/2011  Findings: Segmental elevation of the left hemidiaphragm.  Stomach bubble and gas filled colon under the hemidiaphragm.  Mild linear fibrosis or atelectasis in the left base.  This is stable since the previous study.  Heart size and pulmonary vascularity are normal for technique.  No focal airspace consolidation in the lungs. Calcification of the aorta.  Degenerative changes in the spine.  No blunting of costophrenic angles.  No pneumothorax.  Degenerative changes in the shoulders.  IMPRESSION: Stable chronic changes in the chest as described.  No evidence of active pulmonary disease.  Original Report Authenticated By: Marlon Pel, M.D.   Medications: I have reviewed the patient's  current medications. Scheduled Meds:   . sodium chloride   Intravenous Once  . acetaminophen  650 mg Oral Once  . amLODipine  10 mg Oral Daily  . heparin  5,000 Units Subcutaneous Q8H  . pantoprazole  40 mg Oral Q1200  . potassium chloride  40 mEq Oral Q4H  . potassium chloride  60 mEq Oral Once  . simvastatin  20 mg Oral QHS   Continuous Infusions:   . sodium chloride 125 mL/hr at 06/27/11 0508   PRN Meds:.acetaminophen, acetaminophen, albuterol, guaiFENesin-dextromethorphan, DISCONTD: guaiFENesin-dextromethorphan, DISCONTD: traMADol  Assessment/Plan: The patient is a 76 yo woman, with a 43-month history of cough, presenting with continued cough and a 1-2 day history of sore throat and fevers.  # Cough - Patient reports non-productive cough x2 months, along with a 1-2 day history of sore throat and fevers.  Enalapril has been discontinued, with no improvement in cough.  Patient has leukocytosis, and CXR shows mild linear fibrosis at left lung base.  Her chronic cough may have components of GERD (epigastric ttp, heartburn) vs chronic rhinosinusitis (can give chronic cough, but less likely given lack of rhinorrhea) vs cough-variant asthma (unlikely given no history of asthma) vs bronchiectasis (fever and evidence of bilateral lower lobe consolidation, but no sputum purulence).  Unlikely ACE-induced given lack of improvement with medication cessation.  Her acute cough may be due to viral URI vs pharyngitis (fever, ST) vs acute sinusitis (less likely given lack of rhinorrhea).  Another possibility is pneumonia in the setting of dehydration (thus not appearing as an opacity on CXR). -incentive spirometry -albuterol prn, robitussin prn -protonix -IV fluids -blood cultures pending -repeat CXR tomorrow after hydration with IVF -hold enalapril -start empiric treatment for CAP with levaquin  # AKI - likely secondary to volume depletion.  Has decreased from 1.27 to 1.2.  Likely prerenal secondary  to volume depletion, with FeNa = 0.33. -microalbumin/creatinine = 80.9 (1 value), will repeat in outpatient setting to evaluate for microalbuminuria -continue IV fluids for volume depletion  # Hypokalemia - patient presents with hypokalemia, likely medication-induced.  After checking with patient's pharmacies, patient is currently taking amlodipine and Triamterene-HCTZ.  Magnesium wnl. -potassium repletion -consider switching triamterene-HCTZ to spironolactone -likely cannot resume ACE at this time due to cough  # Hyperlipidemia - chronic, stable. LDL = 89. -continue statin  # History of hypothyroidism - currently not on synthroid.   -checking TSH  # Prophy - lovenox   LOS: 1 day   AKIRRA, LACERDA 06/27/2011, 10:32 AM

## 2011-06-27 NOTE — Progress Notes (Signed)
Pt reported she had called out for something two hours ago. Discussed if she had used her call light. Stated " No I told the doctor that was in here to see me". Staff nurse not notified.  Apologized for any misunderstanding and clarified pt would like something for cough and fever.

## 2011-06-27 NOTE — ED Notes (Signed)
Report called to RN for [phone]

## 2011-06-27 NOTE — H&P (Signed)
Hospital Admission Note Date: 06/27/2011  Patient name: [name] Medical record number: [id number] Date of birth: [birth year] Age: 76 y.o. Gender: female PCP: Dierdre Searles, NA, MD, MD  Medical Service:          Internal Medicine Teaching Service    Attending physician:  Dr. Blanch Media   1st Contact:  Dr. Janalyn Harder       Pager: [phone] 2nd Contact:  Dr. Bard Herbert Pager: [phone] After 5 pm or weekends: 1st Contact:      Pager: [phone] 2nd Contact:      Pager: [phone]  Chief Complaint:  Chief Complaint  Patient presents with  . Cough  . Shortness of Breath  . Wheezing     History of Present Illness: [name] is a 76 y.o.female with past medical history significant for chronic cough, hypertension, and hyperlipidemia who presents with fevers, cough, shortness of breath, and fatigue for approximately 2 months ago with recent increase in symptoms.    [name] was seen in the clinic in March and early April for cough.  states that she was at her baseline on Tuesday or Wednesday she started to have a non-productive cough and wheezes. Some SOB when coughing. HEr throat is sore for the past couple of days. Endorses fever and chills.  Headache also began on tues or weds. Continuous aching pain on the right temporal region. Tried alkaseltzer for her cough which helped a little. She denies any neck stiffness, hearing or vision changes or photophobia.  Got tylenol x 2 helped headache a little. Now it comes and goes and is currently 8/10 in severity.  Endorses frequent urination, but not dysuria.  Has been tired and not moving around much and not eating. Denies sick contacts, travel, or change in diet. Drinks bottled water.  Denies chest pain, abdominal pain, dizzyness, weakness, nausea, vomiting, and diarrhea.  Ms. Gardenhire states that she is only taking 3 pills daily. She cannot identify them but states that to her blood pressure medicines and one is a cholesterol medicine.  Review of  Systems: Bold Items are Positive Constitutional: Fever, chills, diaphoresis, appetite change and fatigue.  HEENT: Double vision, blurry vision, photophobia hearing loss, ear pain, congestion, sore throat, rhinorrhea,  neck pain, neck stiffness Respiratory: SOB, DOE, cough, chest tightness,  and wheezing.   Cardiovascular: Chest pain, palpitations and leg swelling.  Gastrointestinal: Nausea, vomiting, abdominal pain, diarrhea, constipation,  Genitourinary: Dysuria, urgency, frequency,flank pain and difficulty urinating.  Musculoskeletal: Myalgias, back pain, joint swelling, arthralgias and gait problem.  Skin: Pallor, rash and wound.  Neurological: Dizziness,  weakness, light-headedness, numbness and headaches.  Hematological: Adenopathy. Easy bruising, personal or family bleeding history  Psychiatric/Behavioral:  mood changes, confusion,   Past Medical History  Diagnosis Date  . Hypothyroidism   . Lumbago   . Hyperlipidemia   . Hx of breast cancer   . Allergic rhinitis, cause unspecified   . Hypertension     Past Surgical History  Procedure Date  . Total hip arthroplasty   . Breast lumpectomy   . Abdominal hysterectomy     Meds: Medications Prior to Admission  Medication Dose Route Frequency Provider Last Rate Last Dose  . 0.9 %  sodium chloride infusion   Intravenous Once Toy Baker, MD 125 mL/hr at 06/26/11 2150    . acetaminophen (TYLENOL) tablet 650 mg  650 mg Oral Once Toy Baker, MD   650 mg at 06/26/11 2146  . potassium chloride SA (K-DUR,KLOR-CON)  CR tablet 60 mEq  60 mEq Oral Once Toy Baker, MD   60 mEq at 06/26/11 2322   Medications Prior to Admission  Medication Sig Dispense Refill  . Acetaminophen-Codeine (TYLENOL/CODEINE #3) 300-30 MG per tablet Take 1 tablet by mouth 2 (two) times daily as needed for pain (take 1 tab twice a day for your shoulder only as you need. Please notify the clinic about excess dizziness and drowsiness).  30 tablet  0  .  amLODipine (NORVASC) 10 MG tablet Take 1 tablet (10 mg total) by mouth daily.  90 tablet  1  . levocetirizine (XYZAL) 5 MG tablet Take 1 tablet (5 mg total) by mouth every evening. Please take it at night time  90 tablet  4  . pantoprazole (PROTONIX) 40 MG tablet Take 1 tablet (40 mg total) by mouth daily.  30 tablet  1  . potassium chloride (K-DUR) 10 MEQ tablet Take 4 tablets (40 mEq total) by mouth 2 (two) times daily.  4 tablet  0  . simvastatin (ZOCOR) 20 MG tablet Take 1 tablet (20 mg total) by mouth at bedtime.  90 tablet  4  . triamterene-hydrochlorothiazide (MAXZIDE-25) 37.5-25 MG per tablet Take 1 each (1 tablet total) by mouth daily.  90 tablet  1  . Lido-Capsaicin-Men-Methyl Sal (MEDI-PATCH-LIDOCAINE) 0.5-0.035-5-20 % PTCH Apply 1 patch topically 2 (two) times daily as needed.  30 patch  0   note the patient endorses only taking 2 blood pressure medicines on cholesterol medicine.   Allergies: Metoprolol and Other  Family History  Problem Relation Age of Onset  . Hypertension Mother   . Alzheimer's disease Mother   . Diabetes Sister     History   Social History  . Marital Status: Legally Separated    Spouse Name: N/A    Number of Children: N/A  . Years of Education: N/A   Occupational History  . not working    Social History Main Topics  . Smoking status: Former Smoker    Quit date: 09/09/1958  . Smokeless tobacco: Never Used  . Alcohol Use: No  . Drug Use: No  . Sexually Active: Not on file   Other Topics Concern  . Not on file   Social History Narrative  . No narrative on file  Lives whith her daughter.     Physical Exam: Blood pressure 126/64, pulse 93, temperature 99.1 F (37.3 C), temperature source Oral, resp. rate 22, height 5\' 2"  (1.575 m), weight 272 lb 11.3 oz (123.7 kg), SpO2 99.00%. Gen: Pleasant, obese female  in no acute distress; alert, appropriate and cooperative throughout examination. Head: Normocephalic, atraumatic. Eyes: PERRL, EOMI,  No signs of anemia or jaundince. Nose: Mucous membranes moist, not inflammed, nonerythematous. Throat: Exterior Oropharynx slightly erythematous, could not visualize tonsils, but no exudate appreciated.  Neck: Supple with no deformities, masses, or tenderness noted. Full range of motion. No lymphadenopathy  Lungs: Normal respiratory effort. Clear to auscultation BL, without crackles or wheezes. Heart: RRR. S1 and S2 normal without  murmur, gallop,or rubs. Abdomen: BS normoactive. Soft, nondistended, non-tender. No masses or organomegaly. Extremities: Trace pretibial edema  Neurologic: A&O X3, CN II - XII are grossly intact. Motor strength is 5/5 in the all 4 extremities, Sensations intact to light touch. No focal neurologic deficit Skin: No visible rashes, scars. Psych: mood and affect are normal.    Lab results: Basic Metabolic Panel:  [facility] 06/26/11 2145  NA 134*  K 2.6*  CL 93*  CO2 26  GLUCOSE 158*  BUN 13  CREATININE 1.27*  CALCIUM 8.9  MG --  PHOS --   Liver Function Tests:  [facility] 06/26/11 2145  AST 28  ALT 6  ALKPHOS 53  BILITOT 0.9  PROT 8.1  ALBUMIN 3.4*   CBC:  [facility] 06/26/11 2145  WBC 16.6*  NEUTROABS 13.8*  HGB 12.1  HCT 37.0  MCV 80.6  PLT 241   Urinalysis: Results for LAJOY, VANAMBURG (MRN [id number]) as of 06/27/2011 03:22  Ref. Range 06/26/2011 23:11  Color, Urine Latest Range: YELLOW  YELLOW  APPearance Latest Range: CLEAR  CLOUDY (A)  Specific Gravity, Urine Latest Range: 1.005-1.030  1.017  pH Latest Range: 5.0-8.0  6.5  Glucose, UA Latest Range: NEGATIVE mg/dL NEGATIVE  Urine Glucose Latest Range: NEG mg/dL   Hemoglobin, Urine Latest Range: NEG    Bilirubin Urine Latest Range: NEGATIVE  NEGATIVE  Ketones, ur Latest Range: NEGATIVE mg/dL NEGATIVE  Protein Latest Range: NEGATIVE mg/dL 409 (A)  Urobilinogen, UA Latest Range: 0.0-1.0 mg/dL 0.2  Nitrite Latest Range: NEGATIVE  NEGATIVE  Leukocytes, UA Latest Range: NEGATIVE  NEGATIVE    Hgb urine dipstick Latest Range: NEGATIVE  TRACE (A)  Urine-Other No range found MUCOUS PRESENT  WBC, UA Latest Range: <3 WBC/hpf 0-2  RBC / HPF Latest Range: <3 RBC/hpf 0-2  Squamous Epithelial / LPF Latest Range: RARE  FEW (A)  Bacteria, UA Latest Range: RARE  FEW (A)    Imaging results:  Dg Chest 2 View  06/26/2011  *RADIOLOGY REPORT*  Clinical Data: Cough and fever for two or 3 days.  Weakness. Nonsmoker.  Nasal congestion and wheezing.  Shortness of breath.  CHEST - 2 VIEW  Comparison: 05/19/2011  Findings: Segmental elevation of the left hemidiaphragm.  Stomach bubble and gas filled colon under the hemidiaphragm.  Mild linear fibrosis or atelectasis in the left base.  This is stable since the previous study.  Heart size and pulmonary vascularity are normal for technique.  No focal airspace consolidation in the lungs. Calcification of the aorta.  Degenerative changes in the spine.  No blunting of costophrenic angles.  No pneumothorax.  Degenerative changes in the shoulders.  IMPRESSION: Stable chronic changes in the chest as described.  No evidence of active pulmonary disease.  Original Report Authenticated By: Marlon Pel, M.D.    Assessment & Plan by Problem:  Fever, Cough, shortness of breath, fatigue -patient likely has viral bronchitis given cough shortness of breath and fevers without obvious long her chest x-ray findings. This has been going on for approximately 2 months with gradual decrease in status. Other considerations include pulmonary embolism. The patient is a moderate probability according to her modified Geneva score of 6. Her Well's score 0, placing her at low probability. Other possibilities include pneumonia with volume depletion, such that it has not manifested on chest x-ray and COPD exacerbation in a very distant smoker. GERD could cause for chronic cough and shortness of breath, however would be unlikely to lead to her fever and malaise. We'll follow blood and  urine cultures. Will treat as viral bronchitis with volume resuscitation, Robitussin for cough suppression, acetaminophen for headache, and monitoring overnight. Will start therapeutic trial of pantoprazole for cough suppression. If patient continues to be febrile with cough, continue chest x-ray after she is volume repleted. -- Admit to inpatient -- D-dimer -- Follow blood and urine cultures -- Normal saline 125 cc per hour -- Acetaminophen 650 mg every 6 hours as needed for headache and  fever -- Pantoprazole 40 daily -- Guaifenesin dextromethorphan as needed for cough  Acute renal failure - likely secondary to volume depletion. Creatinine increased to 1.27 from baseline of approximately 1. In March creatinine was 1.14 and ACE inhibitor was discontinued. Followup creatinine on April 9 was 1.03.  As BUN is only 13 at this time, we will obtain FENa.  We'll continue to monitor creatinine and consider further workup if it continues to rise. Ms. Dosanjh also has proteinuria that is new. Will check urine microalbumin/creatinine ratio to evaluate further. -- Urine sodium -- Urine creatinine -- will check urine microalbumin creatinine ratio  Hypokalemia -  likely secondary to HCTZ. use without taking her potassium as prescribed. patient started received 60 milliequivalents of potassium at the [facility] long ED. Given her current lab level of potassium, this will only elevate her to 3.2. We will recheck basic metabolic panel at 5 AM and supplement as necessary.   Hypertension - blood pressure stable for now in the 120s to 130s over 60s to 70s. Will start amlodipine and hold triamterene HCTZ.  Hyperlipidemia - will check lipid panel as last was in July of 2012. We'll continue simvastatin.  History of hypothyroidism - last TSH was normal and the patient takes no medication, however we will repeat to ensure  DVT prophylaxis-heparin  Signed: Tasheena Wambolt 06/27/2011, 2:39 AM

## 2011-06-28 ENCOUNTER — Inpatient Hospital Stay (HOSPITAL_COMMUNITY): Payer: Medicare Other

## 2011-06-28 DIAGNOSIS — E876 Hypokalemia: Secondary | ICD-10-CM

## 2011-06-28 LAB — CBC
HCT: 34 % — ABNORMAL LOW (ref 36.0–46.0)
Hemoglobin: 11.1 g/dL — ABNORMAL LOW (ref 12.0–15.0)
MCHC: 32.6 g/dL (ref 30.0–36.0)
MCV: 80.4 fL (ref 78.0–100.0)

## 2011-06-28 LAB — URINE CULTURE: Culture  Setup Time: 201304221349

## 2011-06-28 LAB — BASIC METABOLIC PANEL
BUN: 12 mg/dL (ref 6–23)
CO2: 23 mEq/L (ref 19–32)
Chloride: 101 mEq/L (ref 96–112)
Creatinine, Ser: 0.98 mg/dL (ref 0.50–1.10)
GFR calc Af Amer: 65 mL/min — ABNORMAL LOW (ref >90)
Potassium: 4.2 mEq/L (ref 3.5–5.1)
Sodium: 134 mEq/L — ABNORMAL LOW (ref 135–145)

## 2011-06-28 MED ORDER — WHITE PETROLATUM GEL
Status: AC
  Administered 2011-06-28: 15:00:00
  Filled 2011-06-28: qty 5

## 2011-06-28 NOTE — Progress Notes (Signed)
Subjective: The patient notes improvement in cough and sore throat overnight.  The patient continued to have fevers to 102.8 yesterday at noon, which has improved somewhat to 100.5.  She had one episode of emesis yesterday afternoon, but notes no further nausea or vomiting yesterday or this morning.  Objective: Vital signs in last 24 hours: Filed Vitals:   06/27/11 1415 06/27/11 1800 06/27/11 2050 06/28/11 0453  BP: 132/74 122/63 140/85 109/66  Pulse: 111 112 115 101  Temp: 100 F (37.8 C) 100 F (37.8 C) 101.2 F (38.4 C) 100.5 F (38.1 C)  TempSrc: Oral Oral Oral Oral  Resp: 20 20 20 20   Height:   5\' 2"  (1.575 m)   Weight:   272 lb 11.3 oz (123.7 kg)   SpO2: 100% 100% 95% 95%   Weight change: 0 lb (0 kg)  Intake/Output Summary (Last 24 hours) at 06/28/11 0814 Last data filed at 06/28/11 0657  Gross per 24 hour  Intake 4436.67 ml  Output      0 ml  Net 4436.67 ml   Physical Exam: General: alert, cooperative, and in no apparent distress HEENT: pupils equal round and reactive to light, vision grossly intact, oropharynx mildly erythematous with no exudate  Neck: supple, no lymphadenopathy Lungs: clear to ascultation bilaterally, normal work of respiration, no wheezes, rales, ronchi Heart: regular rate and rhythm, no murmurs, gallops, or rubs Abdomen: soft, mild epigastric ttp, non-distended, normal bowel sounds Extremities: no cyanosis, clubbing, or edema Neurologic: alert & oriented X3, cranial nerves II-XII intact, strength grossly intact, sensation intact to light touch  Lab Results: Basic Metabolic Panel:  Lab 06/27/11 1610 06/26/11 2145  NA 133* 134*  K 2.6* 2.6*  CL 95* 93*  CO2 25 26  GLUCOSE 217* 158*  BUN 13 13  CREATININE 1.20* 1.27*  CALCIUM 8.6 8.9  MG 1.9 --  PHOS -- --   Liver Function Tests:  Lab 06/26/11 2145  AST 28  ALT 6  ALKPHOS 53  BILITOT 0.9  PROT 8.1  ALBUMIN 3.4*   CBC:  Lab 06/28/11 0645 06/27/11 0725 06/26/11 2145  WBC 11.3*  15.9* --  NEUTROABS -- -- 13.8*  HGB 11.1* 11.0* --  HCT 34.0* 33.6* --  MCV 80.4 80.0 --  PLT 190 210 --   Fasting Lipid Panel:  Lab 06/27/11 0725  CHOL 145  HDL 44  LDLCALC 89  TRIG 59  CHOLHDL 3.3  LDLDIRECT --    Urinalysis:  Lab 06/26/11 2311  COLORURINE YELLOW  LABSPEC 1.017  PHURINE 6.5  GLUCOSEU NEGATIVE  HGBUR TRACE*  BILIRUBINUR NEGATIVE  KETONESUR NEGATIVE  PROTEINUR 100*  UROBILINOGEN 0.2  NITRITE NEGATIVE  LEUKOCYTESUR NEGATIVE    Studies/Results: Dg Chest 2 View  06/26/2011  *RADIOLOGY REPORT*  Clinical Data: Cough and fever for two or 3 days.  Weakness. Nonsmoker.  Nasal congestion and wheezing.  Shortness of breath.  CHEST - 2 VIEW  Comparison: 05/19/2011  Findings: Segmental elevation of the left hemidiaphragm.  Stomach bubble and gas filled colon under the hemidiaphragm.  Mild linear fibrosis or atelectasis in the left base.  This is stable since the previous study.  Heart size and pulmonary vascularity are normal for technique.  No focal airspace consolidation in the lungs. Calcification of the aorta.  Degenerative changes in the spine.  No blunting of costophrenic angles.  No pneumothorax.  Degenerative changes in the shoulders.  IMPRESSION: Stable chronic changes in the chest as described.  No evidence of active pulmonary disease.  Original Report Authenticated By: Marlon Pel, M.D.   Medications: I have reviewed the patient's current medications. Scheduled Meds:    . amLODipine  10 mg Oral Daily  . heparin  5,000 Units Subcutaneous Q8H  . levofloxacin  500 mg Oral Daily  . pantoprazole  40 mg Oral Q1200  . potassium chloride  40 mEq Oral Q4H  . simvastatin  20 mg Oral QHS   Continuous Infusions:    . sodium chloride 125 mL/hr at 06/28/11 0558   PRN Meds:.acetaminophen, acetaminophen, albuterol, guaiFENesin-dextromethorphan, ondansetron (ZOFRAN) IV, ondansetron  Assessment/Plan: The patient is a 76 yo woman, with a 56-month history  of cough, presenting with continued cough and a 1-2 day history of sore throat and fevers.  # Cough - Patient reports non-productive cough x2 months, along with a 1-2 day history of sore throat and fevers.  Enalapril has been discontinued, with no improvement in cough.  Patient has leukocytosis, and CXR shows mild linear fibrosis at left lung base.  Her chronic cough may have components of GERD (epigastric ttp, heartburn) vs chronic rhinosinusitis (can give chronic cough, but less likely given lack of rhinorrhea) vs cough-variant asthma (unlikely given no history of asthma) vs bronchiectasis (fever and evidence of bilateral lower lobe consolidation, but no sputum purulence).  Unlikely ACE-induced given lack of improvement with medication cessation.  Her acute cough may be due to viral URI vs pharyngitis (fever, ST) vs acute sinusitis (less likely given lack of rhinorrhea) vs CAP (no cxr opacity, but fevers and cough). -incentive spirometry -albuterol prn, robitussin prn -protonix -IV fluids -blood cultures pending -repeat CXR this morning -continue levaquin for empiric CAP treatment  # AKI - Likely prerenal secondary to volume depletion, with FeNa = 0.33. -microalbumin/creatinine = 80.9 (1 value), will repeat in outpatient setting to evaluate for microalbuminuria -continue IV fluids for volume depletion  # Hypokalemia - patient presents with hypokalemia, likely medication-induced.  After checking with patient's pharmacies, patient is currently taking amlodipine and Triamterene-HCTZ.  Magnesium wnl. -potassium repletion -consider switching triamterene-HCTZ to spironolactone -likely cannot resume ACE at this time due to cough  # Hyperlipidemia - chronic, stable. LDL = 89. -continue statin  # History of hypothyroidism - currently not on synthroid.  TSH elevated. -checking free T4 today  # Prophy - lovenox   LOS: 2 days   IVAH, GIRARDOT 06/28/2011, 8:14 AM

## 2011-06-28 NOTE — Progress Notes (Signed)
Internal Medicine Teaching Service Attending Note Date: 06/28/2011  Patient name: [name]  Medical record number: [id number]  Date of birth: 16-Jan-1936    This patient has been seen and discussed with the house staff. Please see their note for complete details. I concur with their findings with the following additions/corrections:  Feels better overall but still with cough. Still febrile, < 24 hours without temp. Leukocytosis resolving. Repeat CXR nl. I still agree with ABX for presumed URI / LRI bacterial infxn. If cough persists, will ned CT of chest. Possible D/C in AM if afebrile.   [name] 06/28/2011, 12:42 PM

## 2011-06-28 NOTE — Progress Notes (Signed)
Physical Therapy Evaluation Patient Details Name: [name] MRN: [id number] DOB: 04-04-1935 Today's Date: 06/28/2011 Time: 4098-1191 PT Time Calculation (min): 22 min  PT Assessment / Plan / Recommendation Clinical Impression  76 yo female admitted with longstanding cough, fever presents to PT with decr efficiency of mobility/amb; will benefit form PT to maximize independence and safety with mobility/amb, to enable safe dc home    PT Assessment  Patient needs continued PT services    Follow Up Recommendations  Home health PT;Supervision - Intermittent    Equipment Recommendations   (consider shower chair)    Frequency Min 3X/week    Precautions / Restrictions Precautions Precautions: Other (comment) (Watch O2 sats) Precaution Comments: Watch O2 sats   Pertinent Vitals/Pain Session conducted on room air, noted O2 sats decr to 87% post amb; Restarted O2 2 liters and O2 sats increased to 95% with approx 4 minutes of seated rest      Mobility  Transfers Transfers: Sit to Stand;Stand to Sit Sit to Stand: 4: Min guard;With upper extremity assist;From chair/3-in-1 Stand to Sit: 4: Min guard;With upper extremity assist;To chair/3-in-1 Details for Transfer Assistance: cues for safety and control; noted dependence on UE push for control Ambulation/Gait Ambulation/Gait Assistance: 4: Min guard Ambulation Distance (Feet): 30 Feet Assistive device: Other (Comment) (unilateral UE support, pushing IV pole to simulate cane) Ambulation/Gait Assistance Details: Noted increased lateral weight shift R and L over stance LE leading to decr efficiency Gait Pattern: Wide base of support         PT Goals Acute Rehab PT Goals PT Goal Formulation: With patient Time For Goal Achievement: 07/12/11 Potential to Achieve Goals: Good Pt will go Supine/Side to Sit: with modified independence PT Goal: Supine/Side to Sit - Progress: Goal set today Pt will go Sit to Supine/Side: with modified  independence PT Goal: Sit to Supine/Side - Progress: Goal set today Pt will go Sit to Stand: with modified independence PT Goal: Sit to Stand - Progress: Goal set today Pt will go Stand to Sit: with modified independence PT Goal: Stand to Sit - Progress: Goal set today Pt will Ambulate: >150 feet;with modified independence;with least restrictive assistive device PT Goal: Ambulate - Progress: Goal set today Pt will Go Up / Down Stairs: 3-5 stairs;with modified independence;with rail(s) PT Goal: Up/Down Stairs - Progress: Goal set today  Visit Information  Last PT Received On: 06/28/11 Assistance Needed: +1    Subjective Data  Subjective: Agreeable to amb Patient Stated Goal: To go home independently   Prior Functioning  Home Living Lives With: Daughter Available Help at Discharge: Family (Daughter works nights) Type of Home: House Home Access: Stairs to enter Secretary/administrator of Steps: 3 Entrance Stairs-Rails: Right Home Layout: One level Bathroom Shower/Tub: Tub/shower unit;Curtain Home Adaptive Equipment: Straight cane Prior Function Level of Independence: Independent;Independent with assistive device(s) (Uses cane outdoors) Able to Take Stairs?: Yes Communication Communication: No difficulties    Cognition  Overall Cognitive Status: Appears within functional limits for tasks assessed/performed Arousal/Alertness: Awake/alert Orientation Level: Appears intact for tasks assessed Behavior During Session: [facility] for tasks performed    Extremity/Trunk Assessment Right Upper Extremity Assessment RUE ROM/Strength/Tone: Within functional levels Left Upper Extremity Assessment LUE ROM/Strength/Tone: Within functional levels Right Lower Extremity Assessment RLE ROM/Strength/Tone: Deficits RLE ROM/Strength/Tone Deficits: Some slight generalized weakness, with dependence on UE push for sit to stand Left Lower Extremity Assessment LLE ROM/Strength/Tone: Deficits LLE  ROM/Strength/Tone Deficits: Some slight generalized weakness, with dependence on UE push for sit to stand  Balance    End of Session PT - End of Session Equipment Utilized During Treatment: Gait belt Activity Tolerance: Patient tolerated treatment well Patient left: in chair;with call bell/phone within reach (Restarted O2) Nurse Communication: Mobility status (O2 sats)   Van Clines [place], [place] [phone]  06/28/2011, 12:44 PM

## 2011-06-28 NOTE — Progress Notes (Signed)
Occupational Therapy Note Chart reviewed. Attempted to see pt for OT earlier but spoke with nursing who states that pt was getting ready to go for a test soon. Will check back later today or tomorrow as schedule allows. Judithann Sauger OTR/L [phone] 06/28/2011

## 2011-06-29 DIAGNOSIS — E785 Hyperlipidemia, unspecified: Secondary | ICD-10-CM

## 2011-06-29 DIAGNOSIS — R05 Cough: Secondary | ICD-10-CM

## 2011-06-29 LAB — BASIC METABOLIC PANEL
BUN: 11 mg/dL (ref 6–23)
Chloride: 101 mEq/L (ref 96–112)
Glucose, Bld: 128 mg/dL — ABNORMAL HIGH (ref 70–99)
Potassium: 4 mEq/L (ref 3.5–5.1)

## 2011-06-29 LAB — CBC
HCT: 33.9 % — ABNORMAL LOW (ref 36.0–46.0)
Hemoglobin: 11 g/dL — ABNORMAL LOW (ref 12.0–15.0)
MCH: 25.9 pg — ABNORMAL LOW (ref 26.0–34.0)
MCHC: 32.4 g/dL (ref 30.0–36.0)

## 2011-06-29 MED ORDER — CALCIUM CARBONATE ANTACID 500 MG PO CHEW: 1.0000 | CHEWABLE_TABLET | Freq: Two times a day (BID) | ORAL | Status: DC

## 2011-06-29 MED ORDER — SUCRALFATE 1 GM/10ML PO SUSP
1.0000 g | Freq: Three times a day (TID) | ORAL | Status: DC
  Administered 2011-06-29 – 2011-06-30 (×5): 1 g via ORAL
  Filled 2011-06-29 (×8): qty 10

## 2011-06-29 MED ORDER — TRAMADOL HCL 50 MG PO TABS
100.0000 mg | ORAL_TABLET | Freq: Four times a day (QID) | ORAL | Status: DC | PRN
  Administered 2011-06-29: 100 mg via ORAL
  Filled 2011-06-29 (×3): qty 2

## 2011-06-29 MED ORDER — CALCIUM CARBONATE ANTACID 500 MG PO CHEW
1.0000 | CHEWABLE_TABLET | Freq: Two times a day (BID) | ORAL | Status: AC
  Administered 2011-06-29 – 2011-06-30 (×2): 200 mg via ORAL
  Filled 2011-06-29 (×2): qty 1

## 2011-06-29 MED ORDER — GI COCKTAIL ~~LOC~~
30.0000 mL | Freq: Once | ORAL | Status: AC
  Administered 2011-06-29: 30 mL via ORAL
  Filled 2011-06-29: qty 30

## 2011-06-29 MED ORDER — ALUM & MAG HYDROXIDE-SIMETH 200-200-20 MG/5ML PO SUSP
15.0000 mL | ORAL | Status: DC | PRN
  Administered 2011-06-29: 15 mL via ORAL
  Filled 2011-06-29: qty 30

## 2011-06-29 NOTE — Progress Notes (Signed)
I walked with the patient today, and observed the following O2 sats by pulse ox  At rest without oxygen: 93% Ambulating without oxygen: 87% Ambulating with oxygen: 93% At rest with oxygen: 95%  Signed, Keyarra Rendall 06/29/2011, 9:59 AM

## 2011-06-29 NOTE — Progress Notes (Addendum)
Subjective: The patient notes continued improvement in cough.  She still notes 3 loose stools overnight.  This morning she notes LUQ abd pain, described as a "burning" sensation, which she notes has been present since admission but worsening now.  She still notes subjective dyspnea when walking and unsteadiness on her feet.  Patient afebrile now for 24 hours, but concerned about going home today given the above complaints.  Objective: Vital signs in last 24 hours: Filed Vitals:   06/28/11 1757 06/28/11 2059 06/29/11 0555 06/29/11 0639  BP: 136/88 122/72 129/74   Pulse: 76 100 99   Temp: 99.1 F (37.3 C) 99.7 F (37.6 C) 98.8 F (37.1 C)   TempSrc: Oral Oral Oral   Resp: 20 19 18    Height:      Weight:  265 lb 14.4 oz (120.611 kg)    SpO2: 100% 93% 93% 100%   Weight change: -6 lb 12.9 oz (-3.088 kg)  Intake/Output Summary (Last 24 hours) at 06/29/11 0945 Last data filed at 06/29/11 0935  Gross per 24 hour  Intake 3601.25 ml  Output      0 ml  Net 3601.25 ml   Physical Exam: General: alert, cooperative, and in no apparent distress HEENT: pupils equal round and reactive to light, vision grossly intact, oropharynx mildly erythematous with no exudate  Neck: supple, no lymphadenopathy Lungs: clear to ascultation bilaterally, increased work of respiration with exertion, no wheezes, rales, ronchi Heart: regular rate and rhythm, no murmurs, gallops, or rubs Abdomen: soft, mild epigastric ttp, non-distended, normal bowel sounds Extremities: no cyanosis, clubbing, or edema Neurologic: alert & oriented X3, cranial nerves II-XII intact, strength grossly intact, sensation intact to light touch  Lab Results: Basic Metabolic Panel:  Lab 06/29/11 9604 06/28/11 0645 06/27/11 0725  NA 134* 134* --  K 4.0 4.2 --  CL 101 101 --  CO2 24 23 --  GLUCOSE 128* 156* --  BUN 11 12 --  CREATININE 0.93 0.98 --  CALCIUM 8.7 8.6 --  MG -- -- 1.9  PHOS -- -- --   Liver Function Tests:  Lab  06/26/11 2145  AST 28  ALT 6  ALKPHOS 53  BILITOT 0.9  PROT 8.1  ALBUMIN 3.4*   CBC:  Lab 06/29/11 0545 06/28/11 0645 06/26/11 2145  WBC 11.0* 11.3* --  NEUTROABS -- -- 13.8*  HGB 11.0* 11.1* --  HCT 33.9* 34.0* --  MCV 80.0 80.4 --  PLT 219 190 --   Fasting Lipid Panel:  Lab 06/27/11 0725  CHOL 145  HDL 44  LDLCALC 89  TRIG 59  CHOLHDL 3.3  LDLDIRECT --    Urinalysis:  Lab 06/26/11 2311  COLORURINE YELLOW  LABSPEC 1.017  PHURINE 6.5  GLUCOSEU NEGATIVE  HGBUR TRACE*  BILIRUBINUR NEGATIVE  KETONESUR NEGATIVE  PROTEINUR 100*  UROBILINOGEN 0.2  NITRITE NEGATIVE  LEUKOCYTESUR NEGATIVE    Studies/Results: Dg Chest 2 View  06/28/2011  *RADIOLOGY REPORT*  Clinical Data: Fever, chest pain, cough  CHEST - 2 VIEW  Comparison: 06/26/2011  Findings: Stable elevation of the left hemidiaphragm with associated left basilar atelectasis.  Lungs otherwise clear.   No pleural effusion or pneumothorax.  Mild cardiomegaly.  Degenerative changes of the visualized thoracolumbar spine.  IMPRESSION: No evidence of acute cardiopulmonary disease.  Mild cardiomegaly.  Original Report Authenticated By: Charline Bills, M.D.   Medications: I have reviewed the patient's current medications. Scheduled Meds:    . amLODipine  10 mg Oral Daily  . gi cocktail  30 mL Oral Once  . heparin  5,000 Units Subcutaneous Q8H  . levofloxacin  500 mg Oral Daily  . pantoprazole  40 mg Oral Q1200  . simvastatin  20 mg Oral QHS  . white petrolatum       Continuous Infusions:    . sodium chloride 125 mL/hr at 06/28/11 0558   PRN Meds:.acetaminophen, acetaminophen, albuterol, alum & mag hydroxide-simeth, guaiFENesin-dextromethorphan, ondansetron (ZOFRAN) IV, ondansetron  Assessment/Plan: The patient is a 76 yo woman, with a 68-month history of cough, presenting with continued cough and a 1-2 day history of sore throat and fevers.  # Cough - Patient reports non-productive cough x2 months, along  with a 1-2 day history of sore throat and fevers.   Patient has leukocytosis, and CXR shows mild linear fibrosis at left lung base.  Her chronic cough may have components of GERD (epigastric ttp, heartburn) and CAP (no cxr opacity, but fevers and cough). -incentive spirometry -albuterol prn, robitussin prn -protonix, carafate, maalox prn -blood cultures negative to date -continue levaquin for empiric CAP treatment  # AKI - Resolved, likely pre-renal with FeNa = 0.33 -microalbumin/creatinine = 80.9 (previously normal), will repeat in outpatient setting to evaluate for microalbuminuria -continue IV fluids for volume depletion  # Hypokalemia - Resolved, patient presents with hypokalemia, likely medication-induced.  After checking with patient's pharmacies, patient is currently taking amlodipine and Triamterene-HCTZ.   -BP's have been stable as inpatient on amlodipine -consider switching triamterene-HCTZ to spironolactone at discharge, or at hospital follow-up given normal inpatient BP's. -likely cannot resume ACE at this time due to cough  # Hyperlipidemia - chronic, stable. LDL = 89. -continue statin  # History of hypothyroidism - found to have high TSH, but normal T4, representing subclinical hypothyroidism  # Prophy - lovenox  # Dispo - patient has been afebrile for 24 hours and is improving clinically, but is concerned about her present symptoms and worried she will just come right back to the hospital if discharged too soon.  We will monitor her symptoms for one more day, and if still afebrile and improving, will plan to discharge home tomorrow.   LOS: 3 days   CHARNICE, ZWILLING 06/29/2011, 9:45 AM

## 2011-06-29 NOTE — Evaluation (Signed)
Occupational Therapy Evaluation Patient Details Name: [name] MRN: [id number] DOB: August 06, 1935 Today's Date: 06/29/2011 Time: 4098-1191 OT Time Calculation (min): 22 min  OT Assessment / Plan / Recommendation Clinical Impression  Pt requires Min A LB ADLs and self care tasks and de-Sats with functional activity noted. Rec HHOT and possible tub bench at this time. Will follow acutely.    OT Assessment  Patient needs continued OT Services    Follow Up Recommendations  Home health OT    Equipment Recommendations  Other (comment) (consider tub bench vs chair w/ back)    Frequency Min 2X/week    Precautions / Restrictions Precautions Precautions: Fall Precaution Comments: Monitor O2 Sats during activity   Pertinent Vitals/Pain Pt w/o c/o pain however reports nausea and then vomits during OT eval. RN notified and aware.    ADL  Eating/Feeding: Performed;Modified independent Where Assessed - Eating/Feeding: Chair Grooming: Performed;Wash/dry hands;Wash/dry face;Modified independent Where Assessed - Grooming: Supported sitting Upper Body Bathing: Simulated;Set up;Other (comment);Minimal assistance (pt gets SOB w/ activty) Where Assessed - Upper Body Bathing: Sitting, chair Lower Body Bathing: Simulated;Minimal assistance;Supervision/safety Where Assessed - Lower Body Bathing: Sitting, chair;Supported Location manager Dressing: Supervision/safety;Modified independent;Performed Where Assessed - Upper Body Dressing: Sitting, chair Lower Body Dressing: Simulated;Minimal assistance;Supervision/safety Where Assessed - Lower Body Dressing: Sit to stand from chair;Sitting, chair Toilet Transfer: Simulated;Supervision/safety Toilet Transfer Method: Proofreader: Bedside commode Toileting - Clothing Manipulation: Simulated;Supervision/safety Where Assessed - Toileting Clothing Manipulation: Standing;Sit on 3-in-1 or toilet Toileting - Hygiene: Simulated;Modified  independent Where Assessed - Toileting Hygiene: Sit on 3-in-1 or toilet Tub/Shower Transfer: Not assessed Ambulation Related to ADLs: Pt become very SOB w/ all activity noted related to ADL's noted, benefits from rest breaks and EC techniques ADL Comments: Pt requires Min A LB ADLs and self care tasks and de-Sats with functional activity noted. Rec HHOT and possible tub bench at this time. Will follow acutely.    OT Goals Acute Rehab OT Goals OT Goal Formulation: With patient Potential to Achieve Goals: Good ADL Goals Pt Will Perform Lower Body Bathing: with set-up;with modified independence;Sitting at sink;Sitting in shower;with adaptive equipment ADL Goal: Lower Body Bathing - Progress: Goal set today Pt Will Perform Upper Body Dressing: with modified independence;Sitting, chair;Sitting, bed;Unsupported ADL Goal: Upper Body Dressing - Progress: Goal set today Pt Will Perform Lower Body Dressing: with modified independence;with set-up;Sit to stand from chair;Sit to stand from bed;with adaptive equipment;Unsupported ADL Goal: Lower Body Dressing - Progress: Goal set today Pt Will Transfer to Toilet: with modified independence;Ambulation;with DME;Extra wide 3-in-1 ADL Goal: Toilet Transfer - Progress: Goal set today Pt Will Perform Toileting - Clothing Manipulation: with modified independence;Sitting on 3-in-1 or toilet;Standing ADL Goal: Toileting - Clothing Manipulation - Progress: Goal set today Pt Will Perform Toileting - Hygiene: with modified independence;Sitting on 3-in-1 or toilet ADL Goal: Toileting - Hygiene - Progress: Goal set today  Visit Information  Last OT Received On: 06/29/11 Assistance Needed: +1    Subjective Data  Subjective: I've been sick on my stomach   Prior Functioning  Home Living Lives With: Daughter Available Help at Discharge: Family (Daughter works nights) Type of Home: House Home Access: Stairs to enter Secretary/administrator of Steps: 3 Entrance  Stairs-Rails: Right Home Layout: One level Bathroom Shower/Tub: Tub/shower unit;Curtain Home Adaptive Equipment: Straight cane;Bedside commode/3-in-1 Prior Function Level of Independence: Independent;Independent with assistive device(s) (Uses cane outdoors) Able to Take Stairs?: Yes Driving: Yes Communication Communication: No difficulties Dominant Hand: Right    Cognition  Overall Cognitive Status: Appears within functional limits for tasks assessed/performed Arousal/Alertness: Awake/alert Orientation Level: Appears intact for tasks assessed    Extremity/Trunk Assessment Right Upper Extremity Assessment RUE ROM/Strength/Tone: Within functional levels RUE Coordination: WFL - gross motor Left Upper Extremity Assessment LUE ROM/Strength/Tone: Within functional levels LUE Coordination: WFL - gross motor   Mobility Bed Mobility Bed Mobility: Supine to Sit;Sit to Supine;Scooting to HOB Supine to Sit: 6: Modified independent (Device/Increase time) Sit to Supine: 6: Modified independent (Device/Increase time) Scooting to [facility]: 6: Modified independent (Device/Increase time) Transfers Transfers: Sit to Stand Sit to Stand: 4: Min guard;From chair/3-in-1;Other (comment) (w/ UE assist) Stand to Sit: 4: Min guard;5: Supervision;Without upper extremity assist;To bed;To chair/3-in-1 Details for Transfer Assistance: Cues for hand placement, safety and control.           End of Session OT - End of Session Equipment Utilized During Treatment: Gait belt;Other (comment) (SPC) Activity Tolerance: Other (comment) (Limited by nausea) Patient left: in bed;with call bell/phone within reach Nurse Communication: Other (comment) (Pt vomitted )   Roselie Awkward Dixon 06/29/2011, 12:02 PM

## 2011-06-29 NOTE — Telephone Encounter (Signed)
Pharmacy aware denied per Dr Li. 

## 2011-06-29 NOTE — Progress Notes (Signed)
Internal Medicine Teaching Service Attending Note Date: 06/29/2011  Patient name: [name]  Medical record number: [id number]  Date of birth: Feb 15, 1936    This patient has been seen and discussed with the house staff. Please see their note for complete details. I concur with their findings with the following additions/corrections:  I agree with Dr Theora Gianotti recommendation to keep [name] an additional day so that we can assess her diarrhea and R/O C diff. If C diff negative, likely ABX induced. Desat with ambulation - hopefully temp 2/2 PNA. Will also need outpt sleep study.   BUTCHER,ELIZABETH 06/29/2011, 11:38 AM

## 2011-06-29 NOTE — Progress Notes (Signed)
   CARE MANAGEMENT NOTE 06/29/2011  Patient:  [name], [name]   Account Number:  [id number]  Date Initiated:  06/27/2011  Documentation initiated by:  Ronny Flurry  Subjective/Objective Assessment:   DX: fever, cough  Order for HHPT and HHOT as well as home oxygen     Action/Plan:   Met with pt who selected [facility] for HH and oxygen.   Anticipated DC Date:  06/30/2011   Anticipated DC Plan:  HOME W HOME HEALTH SERVICES         [facility] Choice  HOME HEALTH  DURABLE MEDICAL EQUIPMENT   Choice offered to / List presented to:  C-1 Patient   DME arranged  [facility]      DME agency  [facility]     HH arranged  HH-2 PT  HH-3 OT      [facility] agency  [facility]   Status of service:  Completed, signed off Medicare Important Message given?   (If response is "NO", the following Medicare IM given date fields will be blank) Date Medicare IM given:   Date Additional Medicare IM given:    Discharge Disposition:  HOME W HOME HEALTH SERVICES  Per UR Regulation:  Reviewed for med. necessity/level of care/duration of stay  If discussed at Long Length of Stay Meetings, dates discussed:    Comments:

## 2011-06-30 ENCOUNTER — Other Ambulatory Visit: Payer: Self-pay | Admitting: Internal Medicine

## 2011-06-30 DIAGNOSIS — G4730 Sleep apnea, unspecified: Secondary | ICD-10-CM

## 2011-06-30 LAB — BASIC METABOLIC PANEL
BUN: 9 mg/dL (ref 6–23)
CO2: 23 mEq/L (ref 19–32)
Calcium: 8.9 mg/dL (ref 8.4–10.5)
Creatinine, Ser: 0.84 mg/dL (ref 0.50–1.10)
GFR calc Af Amer: 78 mL/min — ABNORMAL LOW (ref >90)
Glucose, Bld: 148 mg/dL — ABNORMAL HIGH (ref 70–99)
Potassium: 3.7 mEq/L (ref 3.5–5.1)

## 2011-06-30 MED ORDER — LEVOFLOXACIN 500 MG PO TABS: 500.0000 mg | ORAL_TABLET | Freq: Every day | ORAL | Status: AC

## 2011-06-30 MED ORDER — PANTOPRAZOLE SODIUM 40 MG PO TBEC: 40.0000 mg | DELAYED_RELEASE_TABLET | Freq: Every day | ORAL | Status: DC

## 2011-06-30 MED ORDER — ALBUTEROL SULFATE (5 MG/ML) 0.5% IN NEBU
2.5000 mg | INHALATION_SOLUTION | RESPIRATORY_TRACT | Status: DC
  Administered 2011-06-30: 2.5 mg via RESPIRATORY_TRACT
  Filled 2011-06-30: qty 0.5

## 2011-06-30 MED ORDER — ALBUTEROL SULFATE (5 MG/ML) 0.5% IN NEBU
2.5000 mg | INHALATION_SOLUTION | RESPIRATORY_TRACT | Status: DC | PRN
  Filled 2011-06-30: qty 0.5

## 2011-06-30 MED ORDER — IPRATROPIUM BROMIDE 0.02 % IN SOLN
0.5000 mg | RESPIRATORY_TRACT | Status: DC
  Administered 2011-06-30: 0.5 mg via RESPIRATORY_TRACT
  Filled 2011-06-30 (×2): qty 2.5

## 2011-06-30 MED ORDER — LOPERAMIDE HCL 2 MG PO TABS: 2.0000 mg | ORAL_TABLET | Freq: Four times a day (QID) | ORAL | Status: AC | PRN

## 2011-06-30 MED ORDER — ALUM & MAG HYDROXIDE-SIMETH 200-200-20 MG/5ML PO SUSP: 15.0000 mL | Freq: Four times a day (QID) | ORAL | Status: AC | PRN

## 2011-06-30 MED ORDER — GUAIFENESIN-DM 100-10 MG/5ML PO SYRP: 5.0000 mL | ORAL_SOLUTION | ORAL | Status: AC | PRN

## 2011-06-30 MED ORDER — ONDANSETRON 4 MG PO TBDP: 4.0000 mg | ORAL_TABLET | Freq: Three times a day (TID) | ORAL | Status: AC | PRN

## 2011-06-30 MED ORDER — ALBUTEROL SULFATE HFA 108 (90 BASE) MCG/ACT IN AERS: 2.0000 | INHALATION_SPRAY | Freq: Four times a day (QID) | RESPIRATORY_TRACT | Status: DC | PRN

## 2011-06-30 NOTE — Discharge Instructions (Signed)
You were hospitalized with Pneumonia.  To treat this, we are prescribing 4 additional days of the antibiotic, Levofloxacin.  You have already had today's dose, so starting tomorrow, take 1 tablet per day until the bottle is empty (4 days).  You will also be on oxygen all day every day for the next 1-2 weeks while you get over this pneumonia.  We are also prescribing an Albuterol inhaler to help open your airways, and improve your breathing while you have this pneumonia.  You were also found to have low potassium levels, which may have been due to your blood pressure medication Triamterene-Hydrochlorothiazide.  Stop taking this medication.  Continue taking your Amlodipine (Norvasc), and we will check your blood pressure in clinic, and see if we need to try another medication.  For your nausea, you may take Zofran, 1 disintegrating tablet under the tongue every 6 hours as needed for nausea.  For your diarrhea, you may take Imodium, 1 tablet after each loose stool.  These side effects should go away after completing your antibiotic regimen.  We have scheduled you for a Sleep Study at [facility], to evaluate you for Sleep Apnea, on May 14th.  Call the Sleep Center in advance for more instructions.   Gastroesophageal Reflux Disease, Adult Gastroesophageal reflux disease (GERD) happens when acid from your stomach flows up into the esophagus. When acid comes in contact with the esophagus, the acid causes soreness (inflammation) in the esophagus. Over time, GERD may create small holes (ulcers) in the lining of the esophagus. CAUSES   Increased body weight. This puts pressure on the stomach, making acid rise from the stomach into the esophagus.   Smoking. This increases acid production in the stomach.   Drinking alcohol. This causes decreased pressure in the lower esophageal sphincter (valve or ring of muscle between the esophagus and stomach), allowing acid from the stomach into the esophagus.    Late evening meals and a full stomach. This increases pressure and acid production in the stomach.   A malformed lower esophageal sphincter.  Sometimes, no cause is found. SYMPTOMS   Burning pain in the lower part of the mid-chest behind the breastbone and in the mid-stomach area. This may occur twice a week or more often.   Trouble swallowing.   Sore throat.   Dry cough.   Asthma-like symptoms including chest tightness, shortness of breath, or wheezing.  DIAGNOSIS  Your caregiver may be able to diagnose GERD based on your symptoms. In some cases, X-rays and other tests may be done to check for complications or to check the condition of your stomach and esophagus. TREATMENT  Your caregiver may recommend over-the-counter or prescription medicines to help decrease acid production. Ask your caregiver before starting or adding any new medicines.  HOME CARE INSTRUCTIONS   Change the factors that you can control. Ask your caregiver for guidance concerning weight loss, quitting smoking, and alcohol consumption.   Avoid foods and drinks that make your symptoms worse, such as:   Caffeine or alcoholic drinks.   Chocolate.   Peppermint or mint flavorings.   Garlic and onions.   Spicy foods.   Citrus fruits, such as oranges, lemons, or limes.   Tomato-based foods such as sauce, chili, salsa, and pizza.   Fried and fatty foods.   Avoid lying down for the 3 hours prior to your bedtime or prior to taking a nap.   Eat small, frequent meals instead of large meals.   Wear loose-fitting clothing.  Do not wear anything tight around your waist that causes pressure on your stomach.   Raise the head of your bed 6 to 8 inches with wood blocks to help you sleep. Extra pillows will not help.   Only take over-the-counter or prescription medicines for pain, discomfort, or fever as directed by your caregiver.   Do not take aspirin, ibuprofen, or other nonsteroidal anti-inflammatory drugs  (NSAIDs).  SEEK IMMEDIATE MEDICAL CARE IF:   You have pain in your arms, neck, jaw, teeth, or back.   Your pain increases or changes in intensity or duration.   You develop nausea, vomiting, or sweating (diaphoresis).   You develop shortness of breath, or you faint.   Your vomit is green, yellow, black, or looks like coffee grounds or blood.   Your stool is red, bloody, or black.  These symptoms could be signs of other problems, such as heart disease, gastric bleeding, or esophageal bleeding. MAKE SURE YOU:   Understand these instructions.   Will watch your condition.   Will get help right away if you are not doing well or get worse.  Document Released: 12/01/2004 Document Revised: 02/10/2011 Document Reviewed: 09/10/2010 [facility] Patient Information 2012 [place], [place].

## 2011-06-30 NOTE — Discharge Summary (Signed)
Internal Medicine Teaching [facility] Discharge Note  Name: [name] MRN: [id number] DOB: 1935/09/03 76 y.o.  Date of Admission: 06/26/2011  7:45 PM Date of Discharge: 06/30/2011 Attending Physician: Burns Spain, MD  Discharge Diagnosis: 1. Community Acquired Pneumonia - suspected, treated with levaquin 2. GERD - started protonix 3. Hypokalemia - likely medication-induced 4. Acute Kidney Injury - prerenal, resolved 5. Hyperlipidemia 6. Subclinical hypothyroidism 7. Hypertension  Discharge Medications: Medication List  As of 06/30/2011 11:37 AM   STOP taking these medications         triamterene-hydrochlorothiazide 37.5-25 MG per tablet         TAKE these medications         Acetaminophen-Codeine 300-30 MG per tablet   Take 1 tablet by mouth 2 (two) times daily as needed for pain (take 1 tab twice a day for your shoulder only as you need. Please notify the clinic about excess dizziness and drowsiness).      albuterol 108 (90 BASE) MCG/ACT inhaler   Commonly known as: PROVENTIL HFA;VENTOLIN HFA   Inhale 2 puffs into the lungs every 6 (six) hours as needed for wheezing.      alum & mag hydroxide-simeth 200-200-20 MG/5ML suspension   Commonly known as: MAALOX/MYLANTA   Take 15 mLs by mouth every 6 (six) hours as needed (heartburn).      amLODipine 10 MG tablet   Commonly known as: NORVASC   Take 1 tablet (10 mg total) by mouth daily.      guaiFENesin-dextromethorphan 100-10 MG/5ML syrup   Commonly known as: ROBITUSSIN DM   Take 5 mLs by mouth every 4 (four) hours as needed for cough.      levocetirizine 5 MG tablet   Commonly known as: XYZAL   Take 1 tablet (5 mg total) by mouth every evening. Please take it at night time      levofloxacin 500 MG tablet   Commonly known as: LEVAQUIN   Take 1 tablet (500 mg total) by mouth daily.   Start taking on: 07/01/2011      Lido-Capsaicin-Men-Methyl Sal 0.5-0.035-5-20 % Ptch   Apply 1 patch topically 2 (two)  times daily as needed.      loperamide 2 MG tablet   Commonly known as: IMODIUM A-D   Take 1 tablet (2 mg total) by mouth 4 (four) times daily as needed for diarrhea or loose stools.      ondansetron 4 MG disintegrating tablet   Commonly known as: ZOFRAN-ODT   Take 1 tablet (4 mg total) by mouth every 8 (eight) hours as needed for nausea.      pantoprazole 40 MG tablet   Commonly known as: PROTONIX   Take 1 tablet (40 mg total) by mouth daily.      potassium chloride 10 MEQ tablet   Commonly known as: K-DUR   Take 4 tablets (40 mEq total) by mouth 2 (two) times daily.      simvastatin 20 MG tablet   Commonly known as: ZOCOR   Take 1 tablet (20 mg total) by mouth at bedtime.            Disposition and follow-up:   Ms.Poppi L Revard was discharged from [facility] in stable and improved condition, with improvement in cough, and resolution of fevers.  The patient will follow-up with Dr. Dierdre Searles on 5/9, to follow-up the following items: 1. Check BMET to ensure that hypokalemia has not persisted 2. Check BP, since patient discharged on  amlodipine monotherapy, and consider adding an additional agent (such as spironolactone.  Likely avoid ACE due to ?contribution to cough, and HCTZ due to hypokalemia).  Follow-up Appointments: Discharge Orders    Future Appointments: Provider: Department: Dept Phone: Center:   07/04/2011 12:30 PM Sherrie George, MD [facility] [phone] None   07/14/2011 3:45 PM Dede Query, MD [facility] [phone] [facility]   07/19/2011 8:45 PM [facility] Fiscal [phone] MSD     Future Orders Please Complete By Expires   Diet - low sodium heart healthy      Increase activity slowly      Discharge instructions      Comments:   You were hospitalized with Pneumonia.  To treat this, we are prescribing 4 additional days of the antibiotic, Levofloxacin.  You have already had today's dose, so starting tomorrow, take 1 tablet per day  until the bottle is empty (4 days).  You will also be on oxygen all day every day for the next 1-2 weeks while you get over this pneumonia.  We are also prescribing an Albuterol inhaler to help open your airways, and improve your breathing while you have this pneumonia.  You were also found to have low potassium levels, which may have been due to your blood pressure medication Triamterene-Hydrochlorothiazide.  Stop taking this medication.  Continue taking your Amlodipine (Norvasc), and we will check your blood pressure in clinic, and see if we need to try another medication.  For your nausea, you may take Zofran, 1 disintegrating tablet under the tongue every 6 hours as needed for nausea.  For your diarrhea, you may take Imodium, 1 tablet after each loose stool.  These side effects should go away after completing your antibiotic regimen.  We have scheduled you for a Sleep Study at [facility], to evaluate you for Sleep Apnea, on May 14th.  Call the Sleep Center in advance for more instructions.   Call MD for:  temperature >100.4      Call MD for:  difficulty breathing, headache or visual disturbances         Consultations: None  Procedures Performed:  Dg Chest 2 View  06/28/2011  *RADIOLOGY REPORT*  Clinical Data: Fever, chest pain, cough  CHEST - 2 VIEW  Comparison: 06/26/2011  Findings: Stable elevation of the left hemidiaphragm with associated left basilar atelectasis.  Lungs otherwise clear.   No pleural effusion or pneumothorax.  Mild cardiomegaly.  Degenerative changes of the visualized thoracolumbar spine.  IMPRESSION: No evidence of acute cardiopulmonary disease.  Mild cardiomegaly.  Original Report Authenticated By: Charline Bills, M.D.   Dg Chest 2 View  06/26/2011  *RADIOLOGY REPORT*  Clinical Data: Cough and fever for two or 3 days.  Weakness. Nonsmoker.  Nasal congestion and wheezing.  Shortness of breath.  CHEST - 2 VIEW  Comparison: 05/19/2011  Findings: Segmental  elevation of the left hemidiaphragm.  Stomach bubble and gas filled colon under the hemidiaphragm.  Mild linear fibrosis or atelectasis in the left base.  This is stable since the previous study.  Heart size and pulmonary vascularity are normal for technique.  No focal airspace consolidation in the lungs. Calcification of the aorta.  Degenerative changes in the spine.  No blunting of costophrenic angles.  No pneumothorax.  Degenerative changes in the shoulders.  IMPRESSION: Stable chronic changes in the chest as described.  No evidence of active pulmonary disease.  Original Report Authenticated By: Marlon Pel, M.D.  Admission HPI:  VARNIKA BUTZ is a 76 y.o.female with past medical history significant for chronic cough, hypertension, and hyperlipidemia who presents with fevers, cough, shortness of breath, and fatigue for approximately 2 months ago with recent increase in symptoms.  NEENA BEECHAM was seen in the clinic in March and early April for cough. states that she was at her baseline on Tuesday or Wednesday she started to have a non-productive cough and wheezes. Some SOB when coughing. HEr throat is sore for the past couple of days. Endorses fever and chills. Headache also began on tues or weds. Continuous aching pain on the right temporal region. Tried alkaseltzer for her cough which helped a little. She denies any neck stiffness, hearing or vision changes or photophobia. Got tylenol x 2 helped headache a little. Now it comes and goes and is currently 8/10 in severity. Endorses frequent urination, but not dysuria. Has been tired and not moving around much and not eating. Denies sick contacts, travel, or change in diet. Drinks bottled water. Denies chest pain, abdominal pain, dizzyness, weakness, nausea, vomiting, and diarrhea.  Ms. Belson states that she is only taking 3 pills daily. She cannot identify them but states that to her blood pressure medicines and one is a cholesterol medicine.    Admission Physical Exam Blood pressure 126/64, pulse 93, temperature 99.1 F (37.3 C), temperature source Oral, resp. rate 22, height 5\' 2"  (1.575 m), weight 272 lb 11.3 oz (123.7 kg), SpO2 99.00%.  Gen: Pleasant, obese female in no acute distress; alert, appropriate and cooperative throughout examination. Head: Normocephalic, atraumatic. Eyes: PERRL, EOMI, No signs of anemia or jaundince.  Nose: Mucous membranes moist, not inflammed, nonerythematous. Throat: Exterior Oropharynx slightly erythematous, could not visualize tonsils, but no exudate appreciated.  Neck: Supple with no deformities, masses, or tenderness noted. Full range of motion. No lymphadenopathy  Lungs: Normal respiratory effort. Clear to auscultation BL, without crackles or wheezes. Heart: RRR. S1 and S2 normal without murmur, gallop,or rubs. Abdomen: BS normoactive. Soft, nondistended, non-tender. No masses or organomegaly. Extremities: Trace pretibial edema  Neurologic: A&O X3, CN II - XII are grossly intact. Motor strength is 5/5 in the all 4 extremities, Sensations intact to light touch. No focal neurologic deficit Skin: No visible rashes, scars.  Psych: mood and affect are normal.   Admission Labs Basic Metabolic Panel:  [facility]  06/26/11 2145   NA  134*   K  2.6*   CL  93*   CO2  26   GLUCOSE  158*   BUN  13   CREATININE  1.27*   CALCIUM  8.9   MG  --   PHOS  --    Liver Function Tests:  [facility]  06/26/11 2145   AST  28   ALT  6   ALKPHOS  53   BILITOT  0.9   PROT  8.1   ALBUMIN  3.4*    CBC:  [facility]  06/26/11 2145   WBC  16.6*   NEUTROABS  13.8*   HGB  12.1   HCT  37.0   MCV  80.6   PLT  241     Hospital Course by problem list: 1. Community Acquired Pneumonia - The patient presented with a 39-month history of cough, worsened over the last few days.  CXR showed mild linear fibrosis vs atelectasis of the left base, but no definite opacity.  However, given patient's cough, sputum  production, and fevers > 102, the patient was started on  Levaquin for empiric CAP treatment.  The patient experienced improvement in cough, and resolution of fevers.  The patient was afebrile for 48 hours prior to discharge.  The patient was discharged to complete the remainder of her Levaquin prescription (8 days total).  Of note, the patient had some nausea and loose stools after starting levaquin, which may have been a medication side effect (c diff negative).  2. GERD - The patient noted a 18-month history of cough, along with symptoms of heartburn, epigastric tenderness, and waking up with a sour taste in the back of her mouth.  We suspected that the patient's cough may have a component of chronic GERD.  Although the patient had protonix on her home medication list, she states that she had not been taking this medication, and was unaware that it was prescribed for her.  The patient was treated with protonix, maalox, and carafate during hospitalization, with improvement in symptoms.  The patient was discharged on protonix.  3. Hypokalemia - The patient presented with hypokalemia of 2.6, with hypokalemia also noted previously at a clinic visit on 4/9.  The patient's hypokalemia was thought to be caused by the patient's HCTZ, and this medication was discontinued.  4. Hypertension - the patient has a history of hypertension, and presented on triamterene-HCTZ and amlodipine.  The patient's triamterene-HCTZ was held due to hypokalemia, and the patient's BP's remained well-controlled during hospitalization.  The patient was discharged on Amlodipine monotherapy, and will follow-up in clinic.  The patient's ACE-inhibitor was stopped at a prior clinic visit due to the patient's history of chronic cough, though the patient noted no improvement in cough after stopping this medication.  If the patient's BP's remain elevated in the outpatient setting, Spironolactone may be a good second choice for BP control.  Time  spent on discharge: 45 minutes  Discharge Vitals:  BP 124/75  Pulse 106  Temp(Src) 98.1 F (36.7 C) (Oral)  Resp 18  Ht 5\' 2"  (1.575 m)  Wt 265 lb 14 oz (120.6 kg)  BMI 48.63 kg/m2  SpO2 100%  Discharge Labs:  Results for orders placed during the hospital encounter of 06/26/11 (from the past 24 hour(s))  CLOSTRIDIUM DIFFICILE BY PCR     Status: Normal   Collection Time   06/29/11  5:26 PM      Component Value Range   C difficile by pcr NEGATIVE  NEGATIVE   BASIC METABOLIC PANEL     Status: Abnormal   Collection Time   06/30/11  5:35 AM      Component Value Range   Sodium 137  135 - 145 (mEq/L)   Potassium 3.7  3.5 - 5.1 (mEq/L)   Chloride 101  96 - 112 (mEq/L)   CO2 23  19 - 32 (mEq/L)   Glucose, Bld 148 (*) 70 - 99 (mg/dL)   BUN 9  6 - 23 (mg/dL)   Creatinine, Ser 4.09  0.50 - 1.10 (mg/dL)   Calcium 8.9  8.4 - 81.1 (mg/dL)   GFR calc non Af Amer 67 (*) >90 (mL/min)   GFR calc Af Amer 78 (*) >90 (mL/min)    Signed: KORINNA, TAT 06/30/2011, 11:37 AM

## 2011-06-30 NOTE — Progress Notes (Signed)
Patient discharge home. Discharge instructions given and explained to patient and daughter.  Copies of all forms given and explained.  Voiced understanding of all instructions.  Patient discharge home in no acute distress.

## 2011-06-30 NOTE — Progress Notes (Signed)
Subjective: The patient notes feeling better this morning.  Less SOB s/p nebulizer treatment.  Less cough.  Continued loose stools, 2-3 per night, but c. Diff negative.  Less epigastric pain, responsive to maalox.  Afebrile for 48 hours.  Objective: Vital signs in last 24 hours: Filed Vitals:   06/29/11 2110 06/30/11 0427 06/30/11 0509 06/30/11 0746  BP: 137/89  130/82   Pulse: 104  101   Temp: 99 F (37.2 C)  97.4 F (36.3 C)   TempSrc: Oral  Oral   Resp: 18  18   Height: 5\' 2"  (1.575 m)     Weight: 265 lb 14 oz (120.6 kg)     SpO2: 93% 96% 95% 96%   Weight change: -0.4 oz (-0.011 kg)  Intake/Output Summary (Last 24 hours) at 06/30/11 0801 Last data filed at 06/29/11 1841  Gross per 24 hour  Intake    480 ml  Output      0 ml  Net    480 ml   Physical Exam: General: alert, cooperative, and in no apparent distress HEENT: pupils equal round and reactive to light, vision grossly intact, oropharynx mildly erythematous with no exudate  Neck: supple, no lymphadenopathy Lungs: clear to ascultation bilaterally, increased work of respiration with exertion, no wheezes, rales, ronchi Heart: regular rate and rhythm, no murmurs, gallops, or rubs Abdomen: soft, mild epigastric ttp, non-distended, normal bowel sounds Extremities: no cyanosis, clubbing, or edema Neurologic: alert & oriented X3, cranial nerves II-XII intact, strength grossly intact, sensation intact to light touch  Lab Results: Basic Metabolic Panel:  Lab 06/30/11 1610 06/29/11 0545 06/27/11 0725  NA 137 134* --  K 3.7 4.0 --  CL 101 101 --  CO2 23 24 --  GLUCOSE 148* 128* --  BUN 9 11 --  CREATININE 0.84 0.93 --  CALCIUM 8.9 8.7 --  MG -- -- 1.9  PHOS -- -- --   Liver Function Tests:  Lab 06/26/11 2145  AST 28  ALT 6  ALKPHOS 53  BILITOT 0.9  PROT 8.1  ALBUMIN 3.4*   CBC:  Lab 06/29/11 0545 06/28/11 0645 06/26/11 2145  WBC 11.0* 11.3* --  NEUTROABS -- -- 13.8*  HGB 11.0* 11.1* --  HCT 33.9* 34.0*  --  MCV 80.0 80.4 --  PLT 219 190 --   Fasting Lipid Panel:  Lab 06/27/11 0725  CHOL 145  HDL 44  LDLCALC 89  TRIG 59  CHOLHDL 3.3  LDLDIRECT --    Urinalysis:  Lab 06/26/11 2311  COLORURINE YELLOW  LABSPEC 1.017  PHURINE 6.5  GLUCOSEU NEGATIVE  HGBUR TRACE*  BILIRUBINUR NEGATIVE  KETONESUR NEGATIVE  PROTEINUR 100*  UROBILINOGEN 0.2  NITRITE NEGATIVE  LEUKOCYTESUR NEGATIVE    Studies/Results: Dg Chest 2 View  06/28/2011  *RADIOLOGY REPORT*  Clinical Data: Fever, chest pain, cough  CHEST - 2 VIEW  Comparison: 06/26/2011  Findings: Stable elevation of the left hemidiaphragm with associated left basilar atelectasis.  Lungs otherwise clear.   No pleural effusion or pneumothorax.  Mild cardiomegaly.  Degenerative changes of the visualized thoracolumbar spine.  IMPRESSION: No evidence of acute cardiopulmonary disease.  Mild cardiomegaly.  Original Report Authenticated By: Charline Bills, M.D.   Medications: I have reviewed the patient's current medications. Scheduled Meds:    . albuterol  2.5 mg Nebulization Q4H  . amLODipine  10 mg Oral Daily  . calcium carbonate  1 tablet Oral BID  . gi cocktail  30 mL Oral Once  . heparin  5,000  Units Subcutaneous Q8H  . ipratropium  0.5 mg Nebulization Q4H  . levofloxacin  500 mg Oral Daily  . pantoprazole  40 mg Oral Q1200  . simvastatin  20 mg Oral QHS  . sucralfate  1 g Oral TID WC & HS  . DISCONTD: calcium carbonate  1 tablet Oral BID   Continuous Infusions:   PRN Meds:.acetaminophen, acetaminophen, albuterol, alum & mag hydroxide-simeth, guaiFENesin-dextromethorphan, ondansetron (ZOFRAN) IV, ondansetron, traMADol, DISCONTD: albuterol  Assessment/Plan: The patient is a 76 yo woman, with a 24-month history of cough, presenting with continued cough and a 1-2 day history of sore throat and fevers.  # Cough - Patient reports non-productive cough x2 months, along with a 1-2 day history of sore throat and fevers.   Patient has  leukocytosis, and CXR shows mild linear fibrosis at left lung base.  Her cough likely represents components of GERD (epigastric ttp, heartburn) and CAP (no cxr opacity, but fevers and cough). -incentive spirometry -albuterol prn, robitussin prn -protonix, carafate, maalox prn -blood cultures negative to date -continue levaquin for CAP treatment  # AKI - Resolved, likely pre-renal with FeNa = 0.33 -microalbumin/creatinine = 80.9 (previously normal), will repeat in outpatient setting to evaluate for microalbuminuria  # Hypokalemia - Resolved, patient presents with hypokalemia, likely medication-induced.  After checking with patient's pharmacies, patient is currently taking amlodipine and Triamterene-HCTZ.   -BP's have been stable as inpatient on amlodipine -consider switching triamterene-HCTZ to spironolactone at discharge, or at hospital follow-up given normal inpatient BP's. -likely cannot resume ACE at this time due to cough  # Hyperlipidemia - chronic, stable. LDL = 89. -continue statin  # History of hypothyroidism - found to have high TSH, but normal T4, representing subclinical hypothyroidism  # Prophy - lovenox  # Dispo - plan for discharge home today, with home oxygen and home PT/OT   LOS: 4 days   [name], [name] 06/30/2011, 8:01 AM

## 2011-06-30 NOTE — Progress Notes (Signed)
Pt ambulated  To  Restroom  On  Return  Had  Audible  wheezing     Prn  Albuterol  tx  Adm   resp  Therapy  notified

## 2011-06-30 NOTE — Progress Notes (Signed)
Physical Therapy Treatment Patient Details Name: [name] MRN: [id number] DOB: 1935/09/23 Today's Date: 06/30/2011 Time: 4782-9562 PT Time Calculation (min): 15 min  PT Assessment / Plan / Recommendation Comments on Treatment Session  Pt. continues to drop her sats on room air with short distance ambulation:  96% on 2Lo2, decreased to 82% on room air with 20' ambulation in room; back wup to 94% with O2 re-application and pursed  lip breathing.  Discussed with MD and he says plan is to send pt. home with home O2.    Follow Up Recommendations  Home health PT;Supervision - Intermittent    Equipment Recommendations  Other (comment)    Frequency Min 3X/week   Plan Discharge plan remains appropriate    Precautions / Restrictions Precautions Precautions: Fall Precaution Comments: Monitor O2 Sats during activity   Pertinent Vitals/Pain See comments for O2 sats.    Mobility  Bed Mobility Bed Mobility: Supine to Sit Supine to Sit: 6: Modified independent (Device/Increase time) Details for Bed Mobility Assistance: Pt. needs increased time due to body habitus Transfers Transfers: Sit to Stand;Stand to Sit Sit to Stand: 4: Min guard;From bed;With upper extremity assist Stand to Sit: 4: Min guard;With upper extremity assist;To bed Details for Transfer Assistance: safety cues Ambulation/Gait Ambulation/Gait Assistance: 4: Min guard Ambulation Distance (Feet): 20 Feet Assistive device: None Ambulation/Gait Assistance Details: pt. has increased lateral shift due body habitus Gait Pattern: Wide base of support Gait velocity: slow pace    Exercises     PT Goals Acute Rehab PT Goals PT Goal Formulation: With patient PT Goal: Supine/Side to Sit - Progress: Progressing toward goal PT Goal: Sit to Stand - Progress: Progressing toward goal PT Goal: Stand to Sit - Progress: Progressing toward goal PT Goal: Ambulate - Progress: Progressing toward goal  Visit Information  Last PT  Received On: 06/30/11 Assistance Needed: +1    Subjective Data  Subjective: I think I'm going home today   Cognition  Overall Cognitive Status: Appears within functional limits for tasks assessed/performed Arousal/Alertness: Awake/alert Orientation Level: Appears intact for tasks assessed Behavior During Session: [facility] for tasks performed    Balance     End of Session PT - End of Session Equipment Utilized During Treatment: Gait belt Activity Tolerance: Patient tolerated treatment well Patient left: in bed;with call bell/phone within reach;Other (comment) (pt. declined up in chair) Nurse Communication: Mobility status    Ferman Hamming 06/30/2011, 11:52 AM Acute Rehabilitation Services [phone] (pager)

## 2011-06-30 NOTE — Progress Notes (Signed)
Internal Medicine Teaching Service Attending Note Date: 06/30/2011  Patient name: [name]  Medical record number: [id number]  Date of birth: 11/20/1935    This patient has been seen and discussed with the house staff. Please see their note for complete details. I concur with their findings with the following additions/corrections:  Ms Defrancesco looked and said she felt better this AM. Still with a bit of cough but needs to complete ABX course. Still with loose stools but C dif negative so likely ABX induced. Medically stable for D/C today with O2 (temp) and outpt sleep study.  Dhanvin Szeto 06/30/2011, 11:49 AM

## 2011-07-03 LAB — CULTURE, BLOOD (ROUTINE X 2)
Culture  Setup Time: 201304220245
Culture: NO GROWTH

## 2011-07-04 ENCOUNTER — Encounter (INDEPENDENT_AMBULATORY_CARE_PROVIDER_SITE_OTHER): Payer: Medicare Other | Admitting: Ophthalmology

## 2011-07-04 ENCOUNTER — Other Ambulatory Visit: Payer: Self-pay | Admitting: *Deleted

## 2011-07-04 DIAGNOSIS — M25511 Pain in right shoulder: Secondary | ICD-10-CM

## 2011-07-04 MED ORDER — ACETAMINOPHEN-CODEINE 300-30 MG PO TABS: 1.0000 | ORAL_TABLET | Freq: Two times a day (BID) | ORAL | Status: DC | PRN

## 2011-07-04 NOTE — Telephone Encounter (Signed)
Tylenol/Codeine #3 rx faxed to [facility] pharmacy.

## 2011-07-05 NOTE — Progress Notes (Signed)
Addended by: Remus Blake on: 07/05/2011 12:19 PM   Modules accepted: Orders

## 2011-07-14 ENCOUNTER — Ambulatory Visit (INDEPENDENT_AMBULATORY_CARE_PROVIDER_SITE_OTHER): Payer: Medicare Other | Admitting: Internal Medicine

## 2011-07-14 ENCOUNTER — Encounter: Payer: Self-pay | Admitting: Internal Medicine

## 2011-07-14 ENCOUNTER — Telehealth: Payer: Self-pay | Admitting: Internal Medicine

## 2011-07-14 VITALS — BP 134/85 | HR 95 | Temp 97.6°F | Ht 62.0 in | Wt 247.0 lb

## 2011-07-14 DIAGNOSIS — I10 Essential (primary) hypertension: Secondary | ICD-10-CM

## 2011-07-14 DIAGNOSIS — J189 Pneumonia, unspecified organism: Secondary | ICD-10-CM

## 2011-07-14 DIAGNOSIS — E876 Hypokalemia: Secondary | ICD-10-CM

## 2011-07-14 DIAGNOSIS — R42 Dizziness and giddiness: Secondary | ICD-10-CM

## 2011-07-14 HISTORY — DX: Pneumonia, unspecified organism: J18.9

## 2011-07-14 LAB — CBC
HCT: 39.4 % (ref 36.0–46.0)
Hemoglobin: 12.8 g/dL (ref 12.0–15.0)
MCH: 26.2 pg (ref 26.0–34.0)
MCHC: 32.5 g/dL (ref 30.0–36.0)
RDW: 14.5 % (ref 11.5–15.5)

## 2011-07-14 LAB — BASIC METABOLIC PANEL WITH GFR
BUN: 11 mg/dL (ref 6–23)
Chloride: 93 mEq/L — ABNORMAL LOW (ref 96–112)
Creat: 0.82 mg/dL (ref 0.50–1.10)
GFR, Est African American: 82 mL/min
GFR, Est Non African American: 71 mL/min
Glucose, Bld: 143 mg/dL — ABNORMAL HIGH (ref 70–99)
Potassium: 2.6 mEq/L — CL (ref 3.5–5.3)

## 2011-07-14 MED ORDER — PANTOPRAZOLE SODIUM 40 MG PO TBEC: 40.0000 mg | DELAYED_RELEASE_TABLET | Freq: Every day | ORAL | Status: DC

## 2011-07-14 MED ORDER — POTASSIUM CHLORIDE ER 10 MEQ PO TBCR: EXTENDED_RELEASE_TABLET | ORAL | Status: DC

## 2011-07-14 MED ORDER — POTASSIUM CHLORIDE CRYS ER 20 MEQ PO TBCR: EXTENDED_RELEASE_TABLET | ORAL | Status: DC

## 2011-07-14 MED ORDER — TRIAMTERENE-HCTZ 37.5-25 MG PO TABS: 1.0000 | ORAL_TABLET | Freq: Every day | ORAL | Status: DC

## 2011-07-14 MED ORDER — AMLODIPINE BESYLATE 10 MG PO TABS: 10.0000 mg | ORAL_TABLET | Freq: Every day | ORAL | Status: DC

## 2011-07-14 NOTE — Progress Notes (Signed)
Patient ID: [name], female   DOB: 05/15/1935, 76 y.o.   MRN: [id number] Patient ID: [name], female   DOB: 03-31-1935, 76 y.o.   MRN: [id number]  Subjective:    Patient ID: [name], female    DOB: May 16, 1935, 76 y.o.   MRN: [id number]  HPI This is a 76 year-old nonsmoking pleasant lady with PMH of HTN, HLD, obesity and chronic right should pain who presents to the clinic for hospital follow up.  1. CAP She was recently hospitalized for treatment of CAP and discharged on 06/30/11 with Levaquin. She states that she finished levaquin as prescribed. Denies cough, chest pain or SOB. Denies fever or chills. However, she reports that she feels tired since discharge. No appetite and weight change.   2. Hypokalemia She was also noted to have hypokalemia with K 2.6 on admission. She was treated and discharged with K level of 3.7. She was also instructed to stop her Maxzide due to hypokalemia. However, she states that she continues to take Maxzide because " I do not remember being told to stop Maxzide."   3. HTN For her HTN, she was discharged with Norvasc 10 mg po daily. She reports compliance with Norvasc that she checks her BP daily and her SBPs are at 120's.     4. Dizziness  Patient reports transient dizziness/lightheadedness with postural change. She states that her dizziness last 10-30 seconds and resolves on its own. Denies vertigo, LOC or headache. Denies seizure activity.   No headache, fever, or sore throat. No shortness of breath or dyspnea on exertion. No chest pain, chest pressure or palpitation No nausea, vomiting, or abdominal pain. No melena, diarrhea or incontinence. No muscle weakness.                   Denies depression. No appetite or weight changes.    Allergies  Allergen Reactions  . Metoprolol Itching  . Other Rash    pickles   Current Outpatient Prescriptions on File Prior to Visit  Medication Sig Dispense Refill  . Acetaminophen-Codeine (TYLENOL/CODEINE #3)  300-30 MG per tablet Take 1 tablet by mouth 2 (two) times daily as needed for pain (take 1 tab twice a day for your shoulder only as you need. Please notify the clinic about excess dizziness and drowsiness).  30 tablet  4  . albuterol (PROVENTIL HFA;VENTOLIN HFA) 108 (90 BASE) MCG/ACT inhaler Inhale 2 puffs into the lungs every 6 (six) hours as needed for wheezing.  1 Inhaler  0  . amLODipine (NORVASC) 10 MG tablet Take 1 tablet (10 mg total) by mouth daily.  90 tablet  2  . levocetirizine (XYZAL) 5 MG tablet Take 1 tablet (5 mg total) by mouth every evening. Please take it at night time  90 tablet  4  . Lido-Capsaicin-Men-Methyl Sal (MEDI-PATCH-LIDOCAINE) 0.5-0.035-5-20 % PTCH Apply 1 patch topically 2 (two) times daily as needed.  30 patch  0  . simvastatin (ZOCOR) 20 MG tablet Take 1 tablet (20 mg total) by mouth at bedtime.  90 tablet  4  . pantoprazole (PROTONIX) 40 MG tablet Take 1 tablet (40 mg total) by mouth daily.  30 tablet  11  . potassium chloride SA (K-DUR,KLOR-CON) 20 MEQ tablet Take 2 tablets every 4 hours for 4 doses  10 tablet  0   Past Medical History  Diagnosis Date  . Hypothyroidism   . Lumbago   . Hyperlipidemia   . Hx of breast cancer   .  Allergic rhinitis, cause unspecified   . Hypertension    Past Surgical History  Procedure Date  . Total hip arthroplasty   . Breast lumpectomy   . Abdominal hysterectomy    History   Social History  . Marital Status: Legally Separated    Spouse Name: N/A    Number of Children: N/A  . Years of Education: N/A   Occupational History  . not working    Social History Main Topics  . Smoking status: Former Smoker    Quit date: 09/09/1958  . Smokeless tobacco: Never Used  . Alcohol Use: No  . Drug Use: No  . Sexually Active: Not on file   Other Topics Concern  . Not on file   Social History Narrative   Lives with daughter.    Family History  Problem Relation Age of Onset  . Hypertension Mother   . Alzheimer's  disease Mother   . Diabetes Sister    Review of system: See HPI    Objective:   Physical Exam General: alert, well-developed, and cooperative to examination. obesity Mouth: pharynx pink and moist, no erythema, and no exudates.  Neck: supple, full ROM, no thyromegaly, no JVD, and no carotid bruits.  Lungs: normal respiratory effort, no accessory muscle use, normal breath sounds, no crackles, and no wheezes. Heart: normal rate, regular rhythm, no murmur, no gallop, and no rub.  Abdomen: soft, non-tender, normal bowel sounds, no distention, no guarding, no rebound tenderness, no hepatomegaly, and no splenomegaly.  Msk: no joint swelling, no joint warmth, and no redness over joints.  Pulses: 2+ DP/PT pulses bilaterally Extremities: No cyanosis, clubbing, edema Neurologic: alert & oriented X3,   Assessment & Plan:

## 2011-07-14 NOTE — Patient Instructions (Signed)
1. Follow up with in one month 2. You will chest X ray in one month 3. Check your BP at home.

## 2011-07-14 NOTE — Telephone Encounter (Signed)
Lab called to notify Critical lab value of K 2.6. I called and spoke to Dr. Dierdre Searles who is her PCP and she would like for patient to go to the ED for further evaluation.  I called patient but she states that she is currently asymptomatic and does not want to go to the ED tonight and would like to take supplementation at home and then will come in to clinic for lab work tomorrow morning.  I instructed her to go to the ED if she develops chestpain or muscle cramping or other new symptoms.  She verbalized her understanding.   -I sent Rx of Potassium Chloride po, 2 tablets every 4 hours x 4 doses, then follow up in clinic in early morning.  I will send front desk a note. Rx sent to [facility]. -Repeat BMP in AM -Pt was instructed to stop Maxzide

## 2011-07-15 ENCOUNTER — Encounter: Payer: Self-pay | Admitting: Ophthalmology

## 2011-07-15 ENCOUNTER — Other Ambulatory Visit: Payer: Medicare Other

## 2011-07-15 ENCOUNTER — Ambulatory Visit (INDEPENDENT_AMBULATORY_CARE_PROVIDER_SITE_OTHER): Payer: Medicare Other | Admitting: Ophthalmology

## 2011-07-15 VITALS — BP 143/83 | HR 95 | Temp 98.0°F | Ht 61.0 in | Wt 245.3 lb

## 2011-07-15 DIAGNOSIS — I10 Essential (primary) hypertension: Secondary | ICD-10-CM

## 2011-07-15 DIAGNOSIS — E876 Hypokalemia: Secondary | ICD-10-CM

## 2011-07-15 DIAGNOSIS — R42 Dizziness and giddiness: Secondary | ICD-10-CM | POA: Insufficient documentation

## 2011-07-15 LAB — BASIC METABOLIC PANEL WITH GFR
CO2: 25 mEq/L (ref 19–32)
Calcium: 9.5 mg/dL (ref 8.4–10.5)
Creat: 0.84 mg/dL (ref 0.50–1.10)
GFR, Est African American: 80 mL/min
Glucose, Bld: 132 mg/dL — ABNORMAL HIGH (ref 70–99)

## 2011-07-15 NOTE — Assessment & Plan Note (Addendum)
Patient's K was 2.6 yesterday with no obvious cause (diarrhea, albuterol use). Maxzide was continued accidentally since discharge. Patient given K 4 X yesterday after critical lab reported. Patient asymptomatic today. Threw away Maxzide bottle so no further confusion. Today's BMET shows K of 3.6, so no further need for repletion. Patient will return in 2 weeks for BP check. Should recheck BMET at this time.

## 2011-07-15 NOTE — Assessment & Plan Note (Addendum)
See HPI  -Patient misunderstood her discharge instructions and continues to take Maxzide along with Norvasc for her Hypertension.  Maxzide is thought to be the etiology of her Hypokalemia during last admission.  - will check her BMP - if her K is ok, she can continue to take Maxzide and Norvasc

## 2011-07-15 NOTE — Assessment & Plan Note (Addendum)
See HPI  Orthostatic VS is negative for orthostatic hypotension. The etiology is likely related to her postural change.   - will ask her to change position slowly.

## 2011-07-15 NOTE — Assessment & Plan Note (Addendum)
She has been taking Maxzide and Norvasc since discharge, and her SBP (per her report) range at 120's. Her BP is 134/85 at clinic today.  - will check her BMP - if her K is normal, we will continue her on Maxzide and Norvasc. - if her K is low, we will need to stop Maxzide even though Maxzide is made of K sparing and K wasting diuretics.    Since she is unable to use BB and ACEI, we may consider to add ARBs.

## 2011-07-15 NOTE — Patient Instructions (Signed)
-  STOP taking the MAXzIDE (we threw bottle away) -continue taking amlodipine -I will call you if you need more potassium pills this evening

## 2011-07-15 NOTE — Assessment & Plan Note (Addendum)
Maxzide was continued accidentally since discharge.Threw away Maxzide bottle so no further confusion. Since she was feeling slightly dizzy with standing up I will not add another agent today. Given her age, I would favor allowing mild hypertension. If her hypertension worsens, she could go back on ACE inhibitor. Though it is listed as an allergy, it was stopped due to cough and cough persisted so this was stopped inappropriately.

## 2011-07-15 NOTE — Assessment & Plan Note (Signed)
Patient finished her ABX Tx and feels well.  - will repeat her CXR 4-6 weeks from her discharge.

## 2011-07-15 NOTE — Progress Notes (Signed)
Subjective:   Patient ID: [name] female   DOB: 04/06/1935 76 y.o.   MRN: [id number]  HPI: [name] is a 76 y.o. woman who is back to clinic for one day follow up for hypokalemia. She took 73m Eq, 4 times overnight per Dr. Christie Nottingham recommendations after she requested not to come into the ED since she was asymptomatic. She didn't take maxzide today only amlodipine. She was hypokalemic when she was admitted 4/21 and was asked to stop maxzide on discharge but did not understand this. Asked to stop yesterday again and has stopped it. Diarrhea has resolved. Is using albuterol inhaler once or twice, not excessively. On chart review, patient was taking maxzide in the past but she reports she was on potassium supplements as well though I cannot find this in EPIC.   Past Medical History  Diagnosis Date  . Hypothyroidism   . Lumbago   . Hyperlipidemia   . Hx of breast cancer   . Allergic rhinitis, cause unspecified   . Hypertension    Current Outpatient Prescriptions  Medication Sig Dispense Refill  . Acetaminophen-Codeine (TYLENOL/CODEINE #3) 300-30 MG per tablet Take 1 tablet by mouth 2 (two) times daily as needed for pain (take 1 tab twice a day for your shoulder only as you need. Please notify the clinic about excess dizziness and drowsiness).  30 tablet  4  . albuterol (PROVENTIL HFA;VENTOLIN HFA) 108 (90 BASE) MCG/ACT inhaler Inhale 2 puffs into the lungs every 6 (six) hours as needed for wheezing.  1 Inhaler  0  . amLODipine (NORVASC) 10 MG tablet Take 1 tablet (10 mg total) by mouth daily.  90 tablet  2  . levocetirizine (XYZAL) 5 MG tablet Take 1 tablet (5 mg total) by mouth every evening. Please take it at night time  90 tablet  4  . Lido-Capsaicin-Men-Methyl Sal (MEDI-PATCH-LIDOCAINE) 0.5-0.035-5-20 % PTCH Apply 1 patch topically 2 (two) times daily as needed.  30 patch  0  . pantoprazole (PROTONIX) 40 MG tablet Take 1 tablet (40 mg total) by mouth daily.  30 tablet  11  . potassium  chloride SA (K-DUR,KLOR-CON) 20 MEQ tablet Take 2 tablets every 4 hours for 4 doses  10 tablet  0  . simvastatin (ZOCOR) 20 MG tablet Take 1 tablet (20 mg total) by mouth at bedtime.  90 tablet  4   Family History  Problem Relation Age of Onset  . Hypertension Mother   . Alzheimer's disease Mother   . Diabetes Sister    History   Social History  . Marital Status: Legally Separated    Spouse Name: N/A    Number of Children: N/A  . Years of Education: N/A   Occupational History  . not working    Social History Main Topics  . Smoking status: Former Smoker    Quit date: 09/09/1958  . Smokeless tobacco: Never Used  . Alcohol Use: No  . Drug Use: No  . Sexually Active: None   Other Topics Concern  . None   Social History Narrative   Lives with daughter.    Objective:  Physical Exam: Filed Vitals:   07/15/11 1410  BP: 143/83  Pulse: 95  Temp: 98 F (36.7 C)  TempSrc: Oral  Height: 5\' 1"  (1.549 m)  Weight: 245 lb 4.8 oz (111.267 kg)  SpO2: 96%   General: pleasant elderly woman sitting in chair in no acute distress HEENT: PERRL, EOMI, no scleral icterus Cardiac: RRR, no rubs, murmurs  or gallops Pulm: clear to auscultation bilaterally, moving normal volumes of air Ext: warm and well perfused, 1+ piting pedal edema, sock lines present and edema present midway up shin Neuro: alert and oriented X3, cranial nerves II-XII grossly intact  Assessment & Plan:

## 2011-07-18 ENCOUNTER — Encounter (INDEPENDENT_AMBULATORY_CARE_PROVIDER_SITE_OTHER): Payer: Medicare Other | Admitting: Ophthalmology

## 2011-07-18 DIAGNOSIS — I10 Essential (primary) hypertension: Secondary | ICD-10-CM

## 2011-07-18 DIAGNOSIS — H43819 Vitreous degeneration, unspecified eye: Secondary | ICD-10-CM

## 2011-07-18 DIAGNOSIS — H35039 Hypertensive retinopathy, unspecified eye: Secondary | ICD-10-CM

## 2011-07-18 DIAGNOSIS — H348392 Tributary (branch) retinal vein occlusion, unspecified eye, stable: Secondary | ICD-10-CM

## 2011-07-18 DIAGNOSIS — H2510 Age-related nuclear cataract, unspecified eye: Secondary | ICD-10-CM

## 2011-07-19 ENCOUNTER — Ambulatory Visit (HOSPITAL_BASED_OUTPATIENT_CLINIC_OR_DEPARTMENT_OTHER): Payer: Medicare Other

## 2011-07-28 ENCOUNTER — Ambulatory Visit (INDEPENDENT_AMBULATORY_CARE_PROVIDER_SITE_OTHER): Payer: Medicare Other | Admitting: Ophthalmology

## 2011-07-28 ENCOUNTER — Encounter: Payer: Self-pay | Admitting: Ophthalmology

## 2011-07-28 VITALS — BP 129/77 | HR 82 | Temp 97.3°F | Ht 62.0 in | Wt 250.5 lb

## 2011-07-28 DIAGNOSIS — E876 Hypokalemia: Secondary | ICD-10-CM

## 2011-07-28 DIAGNOSIS — Z5181 Encounter for therapeutic drug level monitoring: Secondary | ICD-10-CM

## 2011-07-28 DIAGNOSIS — I10 Essential (primary) hypertension: Secondary | ICD-10-CM

## 2011-07-28 DIAGNOSIS — R600 Localized edema: Secondary | ICD-10-CM | POA: Insufficient documentation

## 2011-07-28 DIAGNOSIS — R609 Edema, unspecified: Secondary | ICD-10-CM

## 2011-07-28 DIAGNOSIS — M25511 Pain in right shoulder: Secondary | ICD-10-CM

## 2011-07-28 HISTORY — DX: Localized edema: R60.0

## 2011-07-28 LAB — BASIC METABOLIC PANEL
CO2: 28 mEq/L (ref 19–32)
Calcium: 9.4 mg/dL (ref 8.4–10.5)
Chloride: 103 mEq/L (ref 96–112)
Glucose, Bld: 117 mg/dL — ABNORMAL HIGH (ref 70–99)
Potassium: 2.8 mEq/L — ABNORMAL LOW (ref 3.5–5.3)
Sodium: 142 mEq/L (ref 135–145)

## 2011-07-28 MED ORDER — ENALAPRIL MALEATE 20 MG PO TABS: 20.0000 mg | ORAL_TABLET | Freq: Two times a day (BID) | ORAL | Status: DC

## 2011-07-28 MED ORDER — ALBUTEROL SULFATE HFA 108 (90 BASE) MCG/ACT IN AERS: 2.0000 | INHALATION_SPRAY | Freq: Four times a day (QID) | RESPIRATORY_TRACT | Status: DC | PRN

## 2011-07-28 MED ORDER — OXYCODONE-ACETAMINOPHEN 5-500 MG PO CAPS: ORAL_CAPSULE | ORAL | Status: DC

## 2011-07-28 MED ORDER — POTASSIUM CHLORIDE ER 10 MEQ PO TBCR: 40.0000 meq | EXTENDED_RELEASE_TABLET | Freq: Every day | ORAL | Status: DC

## 2011-07-28 NOTE — Assessment & Plan Note (Signed)
Patient had been on diuretic and now is not, she is also on amlodipine which can cause leg swelling. Asked her to stop amlodipine and switch to enalapril.

## 2011-07-28 NOTE — Patient Instructions (Addendum)
-  STOP amlodipine -START enalapril once a day for BP -check BP and if it is usually over 140 systolic or over 90 diastolic, please call us so we can see you back to adjust medicine in next 2 weeks

## 2011-07-28 NOTE — Progress Notes (Signed)
  Subjective:   Patient ID: [name] female   DOB: 12-29-1935 76 y.o.   MRN: [id number]  HPI: [name] is a 76 y.o. woman who presents for follow up of HTN, hypokalemia. She was on maxzide- triamterene HCTZ but was having dizziness with standing up and low potassium so this was stopped. Potassium normalized once maxzide stopped. Now on amlodipine only. Complains of swelling in her legs. Is a little bit uncomfortable / tight. Was previously on 3 meds.  [place] Spaniel got colonoscopy last year across from [place]. Asked Venita Sheffield to update routine health maintenance tab. Due for colonoscopy in 2017.  Past Medical History  Diagnosis Date  . Hypothyroidism   . Lumbago   . Hyperlipidemia   . Hx of breast cancer   . Allergic rhinitis, cause unspecified   . Hypertension    Current Outpatient Prescriptions  Medication Sig Dispense Refill  . Acetaminophen-Codeine (TYLENOL/CODEINE #3) 300-30 MG per tablet Take 1 tablet by mouth 2 (two) times daily as needed for pain (take 1 tab twice a day for your shoulder only as you need. Please notify the clinic about excess dizziness and drowsiness).  30 tablet  4  . albuterol (PROVENTIL HFA;VENTOLIN HFA) 108 (90 BASE) MCG/ACT inhaler Inhale 2 puffs into the lungs every 6 (six) hours as needed for wheezing.  1 Inhaler  0  . amLODipine (NORVASC) 10 MG tablet Take 1 tablet (10 mg total) by mouth daily.  90 tablet  2  . levocetirizine (XYZAL) 5 MG tablet Take 1 tablet (5 mg total) by mouth every evening. Please take it at night time  90 tablet  4  . Lido-Capsaicin-Men-Methyl Sal (MEDI-PATCH-LIDOCAINE) 0.5-0.035-5-20 % PTCH Apply 1 patch topically 2 (two) times daily as needed.  30 patch  0  . pantoprazole (PROTONIX) 40 MG tablet Take 1 tablet (40 mg total) by mouth daily.  30 tablet  11  . simvastatin (ZOCOR) 20 MG tablet Take 1 tablet (20 mg total) by mouth at bedtime.  90 tablet  4   Family History  Problem Relation Age of Onset  . Hypertension Mother   .  Alzheimer's disease Mother   . Diabetes Sister    History   Social History  . Marital Status: Legally Separated    Spouse Name: N/A    Number of Children: N/A  . Years of Education: N/A   Occupational History  . not working    Social History Main Topics  . Smoking status: Former Smoker    Quit date: 09/09/1958  . Smokeless tobacco: Never Used  . Alcohol Use: No  . Drug Use: No  . Sexually Active: None   Other Topics Concern  . None   Social History Narrative   Lives with daughter.     Objective:  Physical Exam: Filed Vitals:   07/28/11 1028  BP: 129/77  Pulse: 82  Temp: 97.3 F (36.3 C)  TempSrc: Oral  Height: 5\' 2"  (1.575 m)  Weight: 250 lb 8 oz (113.626 kg)  SpO2: 95%   General: pleasant elderly woman with braided hair sitting in chair HEENT: PERRL, EOMI, no scleral icterus Ext: warm and well perfused, pedal edema present bilaterally, R>L Neuro: alert and oriented X3, cranial nerves II-XII grossly intact  Assessment & Plan:

## 2011-07-28 NOTE — Assessment & Plan Note (Signed)
Patient's BP is well controlled on amlodipine alone. Switched her to enalapril which she has taken in the past. Removed it from her allergies since the cough she had persisted after stopping it. This should help with hypokalemia as well.

## 2011-07-28 NOTE — Assessment & Plan Note (Addendum)
Patient is again hypokalemic, she is not on any diuretic currently. She denied diarrhea on last visit. Will request appt for next week to further work up possible hyper-aldo vs. RTA. Called her and asked her to take potassium each day and called in prescription to pharmacy. Also she is switching to ACE inhibitor so this may help.

## 2011-07-29 LAB — PRESCRIPTION ABUSE MONITORING 15P, URINE
Buprenorphine, Urine: NEGATIVE ng/mL
Cocaine Metabolites: NEGATIVE ng/mL
Creatinine, Urine: 274.02 mg/dL (ref >20.0)
Meperidine, Ur: NEGATIVE ng/mL
Methadone Screen, Urine: NEGATIVE ng/mL
Opiate Screen, Urine: POSITIVE ng/mL — ABNORMAL HIGH
Oxycodone Screen, Ur: NEGATIVE ng/mL
Propoxyphene: NEGATIVE ng/mL

## 2011-08-02 LAB — OPIATES/OPIOIDS (LC/MS-MS)
Hydrocodone: NEGATIVE NG/ML
Hydromorphone: NEGATIVE NG/ML
Morphine Urine: 2820 NG/ML — ABNORMAL HIGH
Oxymorphone: NEGATIVE NG/ML

## 2011-08-11 ENCOUNTER — Encounter (HOSPITAL_BASED_OUTPATIENT_CLINIC_OR_DEPARTMENT_OTHER): Payer: Medicare Other

## 2011-08-15 ENCOUNTER — Encounter (INDEPENDENT_AMBULATORY_CARE_PROVIDER_SITE_OTHER): Payer: Medicare Other | Admitting: Ophthalmology

## 2011-08-15 DIAGNOSIS — H35039 Hypertensive retinopathy, unspecified eye: Secondary | ICD-10-CM

## 2011-08-15 DIAGNOSIS — I10 Essential (primary) hypertension: Secondary | ICD-10-CM

## 2011-08-15 DIAGNOSIS — H348392 Tributary (branch) retinal vein occlusion, unspecified eye, stable: Secondary | ICD-10-CM

## 2011-08-15 DIAGNOSIS — H43819 Vitreous degeneration, unspecified eye: Secondary | ICD-10-CM

## 2011-08-15 DIAGNOSIS — H2510 Age-related nuclear cataract, unspecified eye: Secondary | ICD-10-CM

## 2011-08-22 ENCOUNTER — Telehealth: Payer: Self-pay | Admitting: Internal Medicine

## 2011-08-22 ENCOUNTER — Encounter: Payer: Self-pay | Admitting: Internal Medicine

## 2011-08-22 ENCOUNTER — Ambulatory Visit (INDEPENDENT_AMBULATORY_CARE_PROVIDER_SITE_OTHER): Payer: Medicare Other | Admitting: Internal Medicine

## 2011-08-22 VITALS — BP 171/93 | HR 88 | Temp 97.8°F | Ht 62.0 in | Wt 249.8 lb

## 2011-08-22 DIAGNOSIS — M1990 Unspecified osteoarthritis, unspecified site: Secondary | ICD-10-CM

## 2011-08-22 DIAGNOSIS — I10 Essential (primary) hypertension: Secondary | ICD-10-CM

## 2011-08-22 DIAGNOSIS — E876 Hypokalemia: Secondary | ICD-10-CM

## 2011-08-22 LAB — BASIC METABOLIC PANEL WITH GFR
BUN: 7 mg/dL (ref 6–23)
CO2: 30 mEq/L (ref 19–32)
Calcium: 9.4 mg/dL (ref 8.4–10.5)
Creat: 0.94 mg/dL (ref 0.50–1.10)
GFR, Est African American: 70 mL/min
Potassium: 2.7 mEq/L — CL (ref 3.5–5.3)
Sodium: 142 mEq/L (ref 135–145)

## 2011-08-22 MED ORDER — LIDO-CAPSAICIN-MEN-METHYL SAL 0.5-0.035-5-20 % EX PTCH: 1.0000 | MEDICATED_PATCH | Freq: Two times a day (BID) | CUTANEOUS | Status: DC | PRN

## 2011-08-22 MED ORDER — POTASSIUM CHLORIDE CRYS ER 20 MEQ PO TBCR: 20.0000 meq | EXTENDED_RELEASE_TABLET | Freq: Two times a day (BID) | ORAL | Status: DC

## 2011-08-22 NOTE — Progress Notes (Signed)
Subjective:   Patient ID: [name] female   DOB: 1935-03-09 76 y.o.   MRN: [id number]  HPI: [name] is a 76 y.o. woman with past medical history significant for hypertension, hypokalemia who presents for evaluation of her refractory hypertension and hyperkalemia  Briefly [name] was admitted to the hospital for community-acquired pneumonia in late April. She was then found to be hypokalemic. She had continued her HCTZ after discharge. However even after HCTZ was discontinued she was still hypokalemic to 2.8 at her followup visit on May 74. She denies any symptoms of hypertension such as chest pain shortness of breath, palpitation or headache.    Past Medical History  Diagnosis Date  . Hypothyroidism   . Lumbago   . Hyperlipidemia   . Hx of breast cancer   . Allergic rhinitis, cause unspecified   . Hypertension    Current Outpatient Prescriptions  Medication Sig Dispense Refill  . albuterol (PROVENTIL HFA;VENTOLIN HFA) 108 (90 BASE) MCG/ACT inhaler Inhale 2 puffs into the lungs every 6 (six) hours as needed for wheezing.  1 Inhaler  5  . enalapril (VASOTEC) 20 MG tablet Take 1 tablet (20 mg total) by mouth 2 (two) times daily.  90 tablet  4  . levocetirizine (XYZAL) 5 MG tablet Take 1 tablet (5 mg total) by mouth every evening. Please take it at night time  90 tablet  4  . Lido-Capsaicin-Men-Methyl Sal (MEDI-PATCH-LIDOCAINE) 0.5-0.035-5-20 % PTCH Apply 1 patch topically 2 (two) times daily as needed.  30 patch  0  . oxyCODONE-acetaminophen (TYLOX) 5-500 MG per capsule Take one half to one capsule as needed every 4-6 hour  30 capsule  0  . pantoprazole (PROTONIX) 40 MG tablet Take 1 tablet (40 mg total) by mouth daily.  30 tablet  11  . potassium chloride (K-DUR) 10 MEQ tablet Take 4 tablets (40 mEq total) by mouth daily.  30 tablet  0  . simvastatin (ZOCOR) 20 MG tablet Take 1 tablet (20 mg total) by mouth at bedtime.  90 tablet  4  . DISCONTD: Acetaminophen-Codeine  (TYLENOL/CODEINE #3) 300-30 MG per tablet Take 1 tablet by mouth 2 (two) times daily as needed for pain (take 1 tab twice a day for your shoulder only as you need. Please notify the clinic about excess dizziness and drowsiness).  30 tablet  4   Family History  Problem Relation Age of Onset  . Hypertension Mother   . Alzheimer's disease Mother   . Diabetes Sister    History   Social History  . Marital Status: Legally Separated    Spouse Name: N/A    Number of Children: N/A  . Years of Education: N/A   Occupational History  . not working    Social History Main Topics  . Smoking status: Former Smoker    Quit date: 09/09/1958  . Smokeless tobacco: Never Used  . Alcohol Use: No  . Drug Use: No  . Sexually Active: None   Other Topics Concern  . None   Social History Narrative   Lives with daughter.    Review of Systems: Denies fever, chills, lightheadedness, chest pain, dyspnea on exertion, shortness of breath, abdominal pain, constipation, diarrhea, blood in stool, dysuria. Endorses lower leg edema.  Objective:  Physical Exam: Filed Vitals:   08/22/11 1505  BP: 171/93  Pulse: 88  Temp: 97.8 F (36.6 C)  TempSrc: Oral  Height: 5\' 2"  (1.575 m)  Weight: 249 lb 12.8 oz (113.309 kg)  SpO2: 94%   Constitutional: Vital signs reviewed.  Patient is an obese woman in no acute distress and cooperative with exam. Alert and oriented x3.  Head: Normocephalic and atraumatic Neck: Supple, Trachea midline normal ROM, No JVD, mass, thyromegaly, or carotid bruit present.  Cardiovascular: RRR, S1 normal, S2 normal, no MRG, pulses symmetric and intact bilaterally. Hyperdynamic carotid pulses Pulmonary/Chest: CTAB, no wheezes, rales, or rhonchi Abdominal: Soft. Non-tender, non-distended, bowel sounds are normal, no masses, organomegaly, or guarding present. No abdominal bruit. Skin: Warm, dry and intact. No rash, cyanosis, or clubbing.  Psychiatric: Normal mood and affect. speech and  behavior is normal. Judgment and thought content normal. Cognition and memory are normal.   Results for MATTIE, NORDELL (MRN [id number]) as of 08/22/2011 15:48  Ref. Range 07/14/2011 16:52 07/15/2011 14:02 07/28/2011 11:59  Sodium Latest Range: 135-145 mEq/L 137 138 142  Potassium Latest Range: 3.5-5.3 mEq/L 2.6 (LL) 3.6 2.8 (L)  Chloride Latest Range: 96-112 mEq/L 93 (L) 99 103  CO2 Latest Range: 19-32 mEq/L 28 25 28   BUN Latest Range: 6-23 mg/dL 11 10 6   Creat Latest Range: 0.50-1.10 mg/dL 4.09 8.11 9.14  Calcium Latest Range: 8.4-10.5 mg/dL 9.7 9.5 9.4  GFR calc non Af Amer Latest Range: >90 mL/min     GFR calc Af Amer Latest Range: >90 mL/min     Glucose Latest Range: 70-99 mg/dL 782 (H) 956 (H) 213 (H)  GFR, Est African American No range found 82 80   GFR, Est Non African American No range found 71 69     Assessment & Plan:    Case discussed with Dr. Rogelia Boga. Please see problem oriented charting for assessment and plan by problem (best viewed under encounters tab).

## 2011-08-22 NOTE — Telephone Encounter (Signed)
I called [name] to discuss her results. I told her her potassium was low (2.7) and needed to be supplemented before her test tomorrow morning. I asked her to take 2 tablets of K. Dur 20 mEq as soon as she gets the prescription filled. She is then to wait 4 hours and take another 2 tablets (40 mEq). She is then to wait another 4 hours intake and other 2 tablets. This will be a total of 120 mEq over 12 hours. We will then check her aldosterone/renin ratio in the morning. I expressly told [name] not to take more than 40 mEq one time and not to take her doses closer together than 4 hours.

## 2011-08-22 NOTE — Assessment & Plan Note (Signed)
Persistent recent onset hypertension and hypokalemia despite repletion and blood pressure control. Patient could potentially have hyperaldosteronism. As renin/aldosterone level should be checked when the potassium is normal and optimally in the morning, I will check a stat BMP today. I gave the patient a prescription for K-Dur 20 mEq tablets (dispensed #10). Depending on the potassium level today I will have the patient replete her potassium tonight. She will then return tomorrow morning to have renin/aldo ratio measured. She will then followup with me on Thursday to review the results. In the meantime she is to continue enalapril.

## 2011-08-22 NOTE — Assessment & Plan Note (Addendum)
See note for hypertension and potential hyperaldosteronism workup. Potassium checked today. Results pending. Patient will fill prescription for K-Dur and will take appropriate amount of potassium after she is called by Dr. Candy Sledge.  Called by Lab about K of 2.7, but noted that Dr. Candy Sledge has already prescribed appropriate amount of K-DUR, recheck BMET at next visit.

## 2011-08-22 NOTE — Patient Instructions (Addendum)
We need to check your aldosterone level because it may be too high and causing your blood pressure to be high. However, we need your potassium level to be normal when we test this. We will check your potassium level in the clinic today. Dr. Candy Sledge will then call you to tell you how much potassium to take tonight. You will then need to come into clinic tomorrow morning to have your aldosterone and renin levels checked.  We will then schedule you for an appointment to be seen by Dr. Candy Sledge on Thursday to discuss the results.

## 2011-08-23 ENCOUNTER — Other Ambulatory Visit: Payer: Medicare Other

## 2011-08-23 ENCOUNTER — Telehealth: Payer: Self-pay | Admitting: Internal Medicine

## 2011-08-23 DIAGNOSIS — M545 Low back pain: Secondary | ICD-10-CM

## 2011-08-23 MED ORDER — LIDOCAINE 5 % EX PTCH: 1.0000 | MEDICATED_PATCH | CUTANEOUS | Status: DC

## 2011-08-23 NOTE — Telephone Encounter (Addendum)
I called [name] because it did not see her reason aldosterone ratio result showing that it had been connected. She tells me she was unable to come to clinic today because she could not get a ride. I asked her if she could call her friends today and determine if she could have a ride bring her to the clinic tomorrow afternoon so that we could again check her potassium level. She will call me tomorrow to let me know if this will be possible. If she is able to come into the clinic we will check her potassium, replete it overnight if necessary, then check renin/aldosterone ratio on Thursday morning.  Also changed prescription for topical anesthetic to Lidoderm 5% patch.

## 2011-08-23 NOTE — Addendum Note (Signed)
Addended by: Quentin Ore C on: 08/23/2011 06:09 PM   Modules accepted: Orders

## 2011-08-24 ENCOUNTER — Other Ambulatory Visit (INDEPENDENT_AMBULATORY_CARE_PROVIDER_SITE_OTHER): Payer: Medicare Other

## 2011-08-24 DIAGNOSIS — I10 Essential (primary) hypertension: Secondary | ICD-10-CM

## 2011-08-24 DIAGNOSIS — E785 Hyperlipidemia, unspecified: Secondary | ICD-10-CM

## 2011-08-24 DIAGNOSIS — E876 Hypokalemia: Secondary | ICD-10-CM

## 2011-08-25 ENCOUNTER — Encounter: Payer: Self-pay | Admitting: Internal Medicine

## 2011-08-25 ENCOUNTER — Ambulatory Visit (INDEPENDENT_AMBULATORY_CARE_PROVIDER_SITE_OTHER): Payer: Medicare Other | Admitting: Internal Medicine

## 2011-08-25 VITALS — BP 183/109 | HR 96 | Temp 98.2°F | Ht 62.0 in | Wt 244.8 lb

## 2011-08-25 DIAGNOSIS — E876 Hypokalemia: Secondary | ICD-10-CM

## 2011-08-25 DIAGNOSIS — I10 Essential (primary) hypertension: Secondary | ICD-10-CM

## 2011-08-25 LAB — BASIC METABOLIC PANEL WITH GFR
BUN: 6 mg/dL (ref 6–23)
Chloride: 100 mEq/L (ref 96–112)
Creat: 0.74 mg/dL (ref 0.50–1.10)
GFR, Est African American: >89 mL/min
Glucose, Bld: 105 mg/dL — ABNORMAL HIGH (ref 70–99)
Potassium: 3.5 mEq/L (ref 3.5–5.3)
Sodium: 139 mEq/L (ref 135–145)

## 2011-08-25 MED ORDER — CARVEDILOL 6.25 MG PO TABS: 6.2500 mg | ORAL_TABLET | Freq: Two times a day (BID) | ORAL | Status: DC

## 2011-08-25 MED ORDER — HYDROCHLOROTHIAZIDE 12.5 MG PO TABS: 12.5000 mg | ORAL_TABLET | Freq: Every day | ORAL | Status: DC

## 2011-08-25 MED ORDER — POTASSIUM CHLORIDE CRYS ER 20 MEQ PO TBCR: EXTENDED_RELEASE_TABLET | ORAL | Status: DC

## 2011-08-25 NOTE — Progress Notes (Signed)
Subjective:   Patient ID: [name] female   DOB: [birth year] 76 y.o.   MRN: [id number]  HPI: NatalieNatalie [name] is a 76 y.o. woman with past medical history significant for hypertension, hypokalemia who presents for evaluation of her refractory hypertension and hyperkalemia  Briefly [name] was admitted to the hospital for community-acquired pneumonia in late April. She was then found to be hypokalemic. She had continued her HCTZ after discharge. However even after HCTZ was discontinued she was still hypokalemic to 2.8 at her followup visit on May 23. She then came ans saw me on June 17 for workup of possible hyperaldosteronism given low potassium and resistant hypertension. At that visit we checked a basic metabolic panel to determine her potassium level, as potassium should be normal when checking region/aldosterone ratio. When I received the potassium level, I called the patient and I asked her to take 3 doses of potassium 40 mEq 4 hours apart for her potassium level of 2.7. Unfortunately she was unable to come in to the clinic the next morning to have a renin/aldosterone ratio checked. I then called the patient that evening (June 18) to determine why she did not come in and she stated that she could not get a ride and did not want to pay the $10 for a taxi. I asked her if she could call her family and arrange a ride for 08/24/2011. I asked her to call me during the day on 6/19 to tell me if she would be able to come in. I told her we would need to check her potassium and then we could supplement her overnight on the 19th and then recheck her renal aldosterone ratio on the 20th when she came in for her appointment. However instead of calling me, she presented to the clinic and had her region/ALT ratio checked yesterday (6/19).  She presents today for followup regarding her blood pressure. She remains asymptomatic from her very high blood pressure denies headaches, shortness of breath, chest pain, blurry  vision, dizziness, lightheadedness, weakness, malaise or numbness.  She states when she took metoprolol in the past it made her skin feel strange. She states it's not exactly an itch. She denies rash, throat swelling, shortness of breath, or hives.  Past Medical History  Diagnosis Date  . Hypothyroidism   . Lumbago   . Hyperlipidemia   . Hx of breast cancer   . Allergic rhinitis, cause unspecified   . Hypertension    Current Outpatient Prescriptions  Medication Sig Dispense Refill  . albuterol (PROVENTIL HFA;VENTOLIN HFA) 108 (90 BASE) MCG/ACT inhaler Inhale 2 puffs into the lungs every 6 (six) hours as needed for wheezing.  1 Inhaler  5  . enalapril (VASOTEC) 20 MG tablet Take 1 tablet (20 mg total) by mouth 2 (two) times daily.  90 tablet  4  . lidocaine (LIDODERM) 5 % Place 1 patch onto the skin daily. Remove & Discard patch within 12 hours or as directed by MD  30 patch  3  . oxyCODONE-acetaminophen (TYLOX) 5-500 MG per capsule Take one half to one capsule as needed every 4-6 hour  30 capsule  0  . pantoprazole (PROTONIX) 40 MG tablet Take 1 tablet (40 mg total) by mouth daily.  30 tablet  11  . potassium chloride SA (K-DUR,KLOR-CON) 20 MEQ tablet Take 1 tablet (20 mEq total) by mouth 2 (two) times daily.  10 tablet  0  . simvastatin (ZOCOR) 20 MG tablet Take 1 tablet (20 mg  total) by mouth at bedtime.  90 tablet  4  . DISCONTD: Acetaminophen-Codeine (TYLENOL/CODEINE #3) 300-30 MG per tablet Take 1 tablet by mouth 2 (two) times daily as needed for pain (take 1 tab twice a day for your shoulder only as you need. Please notify the clinic about excess dizziness and drowsiness).  30 tablet  4   Family History  Problem Relation Age of Onset  . Hypertension Mother   . Alzheimer's disease Mother   . Diabetes Sister    History   Social History  . Marital Status: Legally Separated    Spouse Name: N/A    Number of Children: N/A  . Years of Education: N/A   Occupational History  . not  working    Social History Main Topics  . Smoking status: Former Smoker    Quit date: 09/09/1958  . Smokeless tobacco: Never Used  . Alcohol Use: No  . Drug Use: No  . Sexually Active: None   Other Topics Concern  . None   Social History Narrative   Lives with daughter.    Review of Systems: Denies fever, chills, lightheadedness, chest pain, dyspnea on exertion, shortness of breath, abdominal pain, constipation, diarrhea, blood in stool, dysuria. Endorses lower leg edema.  Objective:  Physical Exam: Filed Vitals:   08/25/11 1541  BP: 196/110  Pulse: 108  Temp: 98.2 F (36.8 C)  TempSrc: Oral  Height: 5\' 2"  (1.575 m)  Weight: 244 lb 12.8 oz (111.041 kg)  SpO2: 95%   Constitutional: Vital signs reviewed.  Patient is an obese woman in no acute distress and cooperative with exam. Alert and oriented x3.  Head: Normocephalic and atraumatic Neck: Supple, Trachea midline normal ROM, No JVD, mass, thyromegaly, or carotid bruit present.  Cardiovascular: RRR, S1 normal, S2 normal, no MRG, pulses symmetric and intact bilaterally. Hyperdynamic carotid pulses Pulmonary/Chest: CTAB, no wheezes, rales, or rhonchi Abdominal: Soft. Non-tender, non-distended, bowel sounds are normal, no masses, organomegaly, or guarding present. No abdominal bruit. Skin: Warm, dry and intact. No rash, cyanosis, or clubbing.  Psychiatric: Normal mood and affect. speech and behavior is normal. Judgment and thought content normal. Cognition and memory are normal.   Lab Results  Component Value Date   CREATININE 0.94 08/22/2011   BUN 7 08/22/2011   NA 142 08/22/2011   K 2.7* 08/22/2011   CL 101 08/22/2011   CO2 30 08/22/2011    Lab Results  Component Value Date   CREATININE 0.74 08/25/2011   BUN 6 08/25/2011   NA 139 08/25/2011   K 3.5 08/25/2011   CL 100 08/25/2011   CO2 26 08/25/2011    Assessment & Plan:   Case discussed with Dr. Rogelia Boga. Please see problem oriented charting for assessment and plan by  problem (best viewed under encounters tab).

## 2011-08-25 NOTE — Assessment & Plan Note (Addendum)
Patient remains severely hypertensive and will require 2 medications to control her blood pressure. We will start carvedilol 6.25 mg by mouth twice a day for better control of hypertension and tachycardia. Reaction to metoprolol does not seem to be IgE mediated (no rash, hives, shortness of breath, throat swelling, diarrhea, or true sensation of itch), however I told the patient that if she develops a rash, shortness of breath, throat swelling or itching to discontinue metoprolol and go to the ED. We will also start HCTZ 12.5 mg daily. We will check potassium today and supplement as necessary. We will await the results of her renin/aldosterone ratio. If test is positive for hyperaldosteronism, we will continue the workup. It is negative, we will likely need to recheck/supplement potassium and retest renin/aldosterone ratio when normokalemic.  Results addendum: ( 08/26/11) Patient's potassium was 3.5 at her visit which is the low end of normal. I will soon that her renin/aldosterone ratios were also had a normal potassium level.

## 2011-08-25 NOTE — Assessment & Plan Note (Addendum)
Patient likely hypokalemic. We will check potassium today and supplement as necessary. I will call the patient and tell her how much potassium to take.  Results addendum: ( 08/26/11) Patient's potassium was 3.5 at her visit which is the low end of normal. I will soon that her renin/aldosterone ratios were also had a normal potassium level. I will call the patient and have her start 40 mEq of potassium daily, as we are starting HCTZ.

## 2011-08-25 NOTE — Patient Instructions (Addendum)
We will check a potassium today and call you to tell you how much K dur you should take from now on.   We have started 2 new medications for your blood pressure. They are carvedilol and HCTZ. Please stop taking carvedilol if you feel itchy and call the clinic.  Return to clinic in 1 week for blood pressure check and to go over lab results.  Please bring all your medications to your next clinic appointment.

## 2011-08-26 ENCOUNTER — Telehealth: Payer: Self-pay | Admitting: Internal Medicine

## 2011-08-26 DIAGNOSIS — E876 Hypokalemia: Secondary | ICD-10-CM

## 2011-08-26 MED ORDER — POTASSIUM CHLORIDE CRYS ER 20 MEQ PO TBCR: 40.0000 meq | EXTENDED_RELEASE_TABLET | Freq: Every day | ORAL | Status: DC

## 2011-08-26 NOTE — Telephone Encounter (Signed)
Called patient to inform her that her potassium level was normal and therefore we will assume that her potassium was normal when she had her renin/aldosterone ratio checked. I asked her to continue potassium supplementation. She is to take K. Dur 40 mEq once daily until her appointment next week.  Lab Results  Component Value Date   CREATININE 0.74 08/25/2011   BUN 6 08/25/2011   NA 139 08/25/2011   K 3.5 08/25/2011   CL 100 08/25/2011   CO2 26 08/25/2011

## 2011-08-30 LAB — ALDOSTERONE + RENIN ACTIVITY W/ RATIO
ALDO / PRA Ratio: 4.3 Ratio (ref 0.9–28.9)
PRA LC/MS/MS: 0.23 ng/mL/h — ABNORMAL LOW (ref 0.25–5.82)

## 2011-08-31 ENCOUNTER — Encounter: Payer: Self-pay | Admitting: Internal Medicine

## 2011-09-02 ENCOUNTER — Encounter: Payer: Self-pay | Admitting: Internal Medicine

## 2011-09-02 ENCOUNTER — Ambulatory Visit (INDEPENDENT_AMBULATORY_CARE_PROVIDER_SITE_OTHER): Payer: Medicare Other | Admitting: Internal Medicine

## 2011-09-02 VITALS — BP 149/84 | HR 78 | Temp 97.4°F | Ht 62.0 in | Wt 242.8 lb

## 2011-09-02 DIAGNOSIS — E876 Hypokalemia: Secondary | ICD-10-CM

## 2011-09-02 DIAGNOSIS — I10 Essential (primary) hypertension: Secondary | ICD-10-CM

## 2011-09-02 LAB — BASIC METABOLIC PANEL WITH GFR
BUN: 9 mg/dL (ref 6–23)
CO2: 25 mEq/L (ref 19–32)
Chloride: 101 mEq/L (ref 96–112)
Creat: 0.88 mg/dL (ref 0.50–1.10)
Glucose, Bld: 98 mg/dL (ref 70–99)
Potassium: 4 mEq/L (ref 3.5–5.3)

## 2011-09-02 MED ORDER — CARVEDILOL 12.5 MG PO TABS: ORAL_TABLET | ORAL | Status: DC

## 2011-09-02 NOTE — Patient Instructions (Signed)

## 2011-09-02 NOTE — Assessment & Plan Note (Signed)
She was started on Coreg 6.25 twice a day. Her blood pressure and heart rate of much better improved today as compared to last office visit.  Patient still has grade 1 hypertension on current medications. Her heart rate is 78. I would go up on the dose of Coreg to 12.5 twice a day. I would check basic metabolic panel today for potassium. Patient is currently on 3 blood pressure medications that this Coreg, enalapril and hydrochlorothiazide. We still have room to increase the dose of all 3 medications if patient's blood pressure remains elevated. Secondary hypertension secondary to hyperaldosteronism been ruled out at this time with ARR of 4.3 and cholesterol level less than 1.

## 2011-09-02 NOTE — Progress Notes (Signed)
  Subjective:    Patient ID: [name], female    DOB: 1935/12/01, 76 y.o.   MRN: [id number]  HPI  Patient is a 76 year old women with PMH as noted in the chart. She was seen last week and some of her BP medications were adjusted. She comes back today for a follow up visit.  Her BP is better controlled today.  Patient was started on Coreg 6.25 twice a day. Patient has no other complaints today.  Review of Systems  Constitutional: Negative for fever, activity change and appetite change.  HENT: Negative for sore throat.   Respiratory: Negative for cough and shortness of breath.   Cardiovascular: Negative for chest pain and leg swelling.  Gastrointestinal: Negative for nausea, abdominal pain, diarrhea, constipation and abdominal distention.  Genitourinary: Negative for frequency, hematuria and difficulty urinating.  Neurological: Negative for dizziness and headaches.  Psychiatric/Behavioral: Negative for suicidal ideas and behavioral problems.       Objective:   Physical Exam  Constitutional: She is oriented to person, place, and time. She appears well-developed and well-nourished.  HENT:  Head: Normocephalic and atraumatic.  Eyes: Conjunctivae and EOM are normal. Pupils are equal, round, and reactive to light. No scleral icterus.  Neck: Normal range of motion. Neck supple. No JVD present. No thyromegaly present.  Cardiovascular: Normal rate, regular rhythm, normal heart sounds and intact distal pulses.  Exam reveals no gallop and no friction rub.   No murmur heard. Pulmonary/Chest: Effort normal and breath sounds normal. No respiratory distress. She has no wheezes. She has no rales.  Abdominal: Soft. Bowel sounds are normal. She exhibits no distension and no mass. There is no tenderness. There is no rebound and no guarding.  Musculoskeletal: Normal range of motion. She exhibits no edema and no tenderness.  Lymphadenopathy:    She has no cervical adenopathy.  Neurological: She is  alert and oriented to person, place, and time.  Psychiatric: She has a normal mood and affect. Her behavior is normal.          Assessment & Plan:

## 2011-09-02 NOTE — Assessment & Plan Note (Signed)
Repeat basic metabolic panel today. Patient was started on potassium at last office visit.

## 2011-09-12 ENCOUNTER — Encounter (INDEPENDENT_AMBULATORY_CARE_PROVIDER_SITE_OTHER): Payer: Medicare Other | Admitting: Ophthalmology

## 2011-09-12 DIAGNOSIS — H2510 Age-related nuclear cataract, unspecified eye: Secondary | ICD-10-CM

## 2011-09-12 DIAGNOSIS — H35039 Hypertensive retinopathy, unspecified eye: Secondary | ICD-10-CM

## 2011-09-12 DIAGNOSIS — I10 Essential (primary) hypertension: Secondary | ICD-10-CM

## 2011-09-12 DIAGNOSIS — H348392 Tributary (branch) retinal vein occlusion, unspecified eye, stable: Secondary | ICD-10-CM

## 2011-09-12 DIAGNOSIS — H43399 Other vitreous opacities, unspecified eye: Secondary | ICD-10-CM

## 2011-10-07 ENCOUNTER — Ambulatory Visit (INDEPENDENT_AMBULATORY_CARE_PROVIDER_SITE_OTHER): Payer: Medicare Other | Admitting: Internal Medicine

## 2011-10-07 ENCOUNTER — Encounter: Payer: Self-pay | Admitting: Internal Medicine

## 2011-10-07 VITALS — BP 118/67 | HR 68 | Temp 97.5°F | Ht 62.0 in | Wt 238.3 lb

## 2011-10-07 DIAGNOSIS — E785 Hyperlipidemia, unspecified: Secondary | ICD-10-CM

## 2011-10-07 DIAGNOSIS — K219 Gastro-esophageal reflux disease without esophagitis: Secondary | ICD-10-CM

## 2011-10-07 DIAGNOSIS — I10 Essential (primary) hypertension: Secondary | ICD-10-CM

## 2011-10-07 MED ORDER — HYDROCHLOROTHIAZIDE 12.5 MG PO TABS
25.0000 mg | ORAL_TABLET | Freq: Every day | ORAL | Status: DC
Start: 2011-10-07 — End: 2011-11-08

## 2011-10-07 MED ORDER — CARVEDILOL 12.5 MG PO TABS: 12.5000 mg | ORAL_TABLET | Freq: Two times a day (BID) | ORAL | Status: DC

## 2011-10-07 NOTE — Progress Notes (Signed)
Patient ID: [name], female   DOB: 06/26/1935, 76 y.o.   MRN: [id number]  Subjective:   Patient ID: [name] female   DOB: February 03, 1936 76 y.o.   MRN: [id number]  HPI: This is a 76 year-old nonsmoking pleasant lady with PMH of HTN, HLD, obesity and chronic right should pain who presents to the clinic for follow up.  Patient has had recent changes on her antihypertensive medications. She was noted to have persistent hypokalemia with Maxzide, which was subsequently stopped. Her Aldosterone/renin ratio was WNL.  She is supposed to be on Lisinopril 20, HCTZ 12.5 and Coreg 12.5 BID. However, she told me that she takes lisinopril 20, HCTZ 12.5, Coreg 6.25 BID and Norvasc 10 at home. Her BP is well controlled. I suspect that she has a hard time understanding her medication adjustments and she is on 4 categories of antihypertensive medications with sub optimal dosage.  I spent > 30 minutes talking to her about her medications. I would like to trim down to 2-3 agents instead of 4 agents since she has not reach max dose on ACEI, HCTZ, Coreg.     Past Medical History  Diagnosis Date  . Hypothyroidism   . Lumbago   . Hyperlipidemia   . Hx of breast cancer   . Allergic rhinitis, cause unspecified   . Hypertension    Current Outpatient Prescriptions  Medication Sig Dispense Refill  . acetaminophen-codeine (TYLENOL #3) 300-30 MG per tablet Take 1 tablet by mouth every 4 (four) hours as needed.      Marland Kitchen albuterol (PROVENTIL HFA;VENTOLIN HFA) 108 (90 BASE) MCG/ACT inhaler Inhale 2 puffs into the lungs every 6 (six) hours as needed for wheezing.  1 Inhaler  5  . amLODipine (NORVASC) 10 MG tablet Take 10 mg by mouth daily.      . carvedilol (COREG) 12.5 MG tablet 6.25 mg. Take 1 tab twice a day.      . enalapril (VASOTEC) 20 MG tablet Take 1 tablet (20 mg total) by mouth 2 (two) times daily.  90 tablet  4  . hydrochlorothiazide (HYDRODIURIL) 12.5 MG tablet Take 1 tablet (12.5 mg total) by mouth daily.  30  tablet  3  . pantoprazole (PROTONIX) 40 MG tablet Take 1 tablet (40 mg total) by mouth daily.  30 tablet  11  . potassium chloride SA (K-DUR,KLOR-CON) 20 MEQ tablet Take 2 tablets (40 mEq total) by mouth daily. Take according to the instructions provided by Dr. Candy Sledge.  60 tablet  3  . simvastatin (ZOCOR) 20 MG tablet Take 1 tablet (20 mg total) by mouth at bedtime.  90 tablet  4  . DISCONTD: carvedilol (COREG) 12.5 MG tablet Take 1 tab twice a day.  60 tablet  3   Family History  Problem Relation Age of Onset  . Hypertension Mother   . Alzheimer's disease Mother   . Diabetes Sister    History   Social History  . Marital Status: Legally Separated    Spouse Name: N/A    Number of Children: N/A  . Years of Education: N/A   Occupational History  . not working    Social History Main Topics  . Smoking status: Former Smoker    Quit date: 09/09/1958  . Smokeless tobacco: Never Used  . Alcohol Use: No  . Drug Use: No  . Sexually Active: None   Other Topics Concern  . None   Social History Narrative   Lives with daughter.  Review of Systems: Review of Systems:  Constitutional:  Denies fever, chills, diaphoresis, appetite change and fatigue.   HEENT:  Denies congestion, sore throat, rhinorrhea, sneezing, mouth sores, trouble swallowing, neck pain   Respiratory:  Denies SOB, DOE, cough, and wheezing.   Cardiovascular:  Denies palpitations and leg swelling.   Gastrointestinal:  Denies nausea, vomiting, abdominal pain, diarrhea, constipation, blood in stool and abdominal distention.   Genitourinary:  Denies dysuria, urgency, frequency, hematuria, flank pain and difficulty urinating.   Musculoskeletal:  Denies myalgias, back pain, joint swelling, arthralgias and gait problem.   Skin:  Denies pallor, rash and wound.   Neurological:  Denies dizziness, seizures, syncope, weakness, light-headedness, numbness and headaches.    .   Objective:  Physical Exam: Filed Vitals:    10/07/11 1037  BP: 118/67  Pulse: 68  Temp: 97.5 F (36.4 C)  TempSrc: Oral  Height: 5\' 2"  (1.575 m)  Weight: 238 lb 4.8 oz (108.092 kg)  General: alert, well-developed, and cooperative to examination.  Head: normocephalic and atraumatic.  Eyes: vision grossly intact, pupils equal, pupils round, pupils reactive to light, no injection and anicteric.  Mouth: pharynx pink and moist, no erythema, and no exudates.  Neck: supple, full ROM, no thyromegaly, no JVD, and no carotid bruits.  Lungs: normal respiratory effort, no accessory muscle use, normal breath sounds, no crackles, and no wheezes. Heart: normal rate, regular rhythm, no murmur, no gallop, and no rub.  Abdomen: soft, non-tender, normal bowel sounds, no distention, no guarding, no rebound tenderness, no hepatomegaly, and no splenomegaly.  Msk: no joint swelling, no joint warmth, and no redness over joints.  Pulses: 2+ DP/PT pulses bilaterally Extremities: No cyanosis, clubbing, edema Neurologic: alert & oriented X3, cranial nerves II-XII intact, strength normal in all extremities, sensation intact to light touch, and gait normal.  Skin: turgor normal and no rashes.  Psych: Oriented X3, memory intact for recent and remote, normally interactive, good eye contact, not anxious appearing, and not depressed appearing.    Assessment & Plan:

## 2011-10-07 NOTE — Assessment & Plan Note (Signed)
Stable, on Zocor  Continue current regimen

## 2011-10-07 NOTE — Assessment & Plan Note (Signed)
Stable on PPI 

## 2011-10-07 NOTE — Patient Instructions (Addendum)
1. Will adjust your medications as instructed 1. F/u in one month

## 2011-10-07 NOTE — Assessment & Plan Note (Addendum)
See HPI  Will instruct her with following changes 1. Stop amlodipine since she has had LE edema due to this med in the past and it does not add mortality benefit.  2. Increase coreg to 12.5 mg po BID 3. Keep lisinopril at 20 mg daily 4. Increase her HCTZ to 25 mg po daily  Follow up in 2-3 weeks.

## 2011-10-10 ENCOUNTER — Encounter (INDEPENDENT_AMBULATORY_CARE_PROVIDER_SITE_OTHER): Payer: Medicare Other | Admitting: Ophthalmology

## 2011-10-10 DIAGNOSIS — H35039 Hypertensive retinopathy, unspecified eye: Secondary | ICD-10-CM

## 2011-10-10 DIAGNOSIS — I10 Essential (primary) hypertension: Secondary | ICD-10-CM

## 2011-10-10 DIAGNOSIS — H348392 Tributary (branch) retinal vein occlusion, unspecified eye, stable: Secondary | ICD-10-CM

## 2011-10-10 DIAGNOSIS — H43819 Vitreous degeneration, unspecified eye: Secondary | ICD-10-CM

## 2011-10-25 ENCOUNTER — Other Ambulatory Visit: Payer: Self-pay | Admitting: *Deleted

## 2011-10-25 MED ORDER — ACETAMINOPHEN-CODEINE #3 300-30 MG PO TABS: 1.0000 | ORAL_TABLET | ORAL | Status: DC | PRN

## 2011-10-25 NOTE — Telephone Encounter (Signed)
Rx faxed in.

## 2011-10-25 NOTE — Telephone Encounter (Signed)
Last filled 10/10/11

## 2011-11-08 ENCOUNTER — Encounter: Payer: Self-pay | Admitting: Internal Medicine

## 2011-11-08 ENCOUNTER — Ambulatory Visit (INDEPENDENT_AMBULATORY_CARE_PROVIDER_SITE_OTHER): Payer: Medicare Other | Admitting: Internal Medicine

## 2011-11-08 VITALS — BP 115/77 | HR 76 | Temp 97.3°F | Ht 62.0 in | Wt 240.3 lb

## 2011-11-08 DIAGNOSIS — I10 Essential (primary) hypertension: Secondary | ICD-10-CM

## 2011-11-08 MED ORDER — CARVEDILOL 6.25 MG PO TABS
6.2500 mg | ORAL_TABLET | Freq: Two times a day (BID) | ORAL | Status: DC
Start: 2011-11-08 — End: 2012-01-16

## 2011-11-08 MED ORDER — LISINOPRIL-HYDROCHLOROTHIAZIDE 20-25 MG PO TABS: 1.0000 | ORAL_TABLET | Freq: Every day | ORAL | Status: DC

## 2011-11-08 NOTE — Patient Instructions (Signed)
1. Stop Enalapril and Hydrochlorothiazide  2.  Start Lisinopril/HCTZ 20/25 mg tablets.  Take 1 tablet in the morning for your blood pressure.  3.  Follow up with Dr. Dierdre Searles in October to recheck your blood pressure and get your flu shot.

## 2011-11-08 NOTE — Progress Notes (Signed)
Subjective:   Patient ID: [name] female   DOB: April 24, 1935 76 y.o.   MRN: [id number]  HPI: [name] is a 76 y.o. woman who presents today for follow up from her last appointment.  She states that she has been taking her medications as prescribed. She has been taking her HCTZ 12.5 mg tablets 1 tablet BID as well as her Coreg 6.25 mg.  The only complaint that she has is that she is up at least 3 times per night to urinate.  She denies headache, dizziness on standing, or cough.    Past Medical History  Diagnosis Date  . Hypothyroidism   . Lumbago   . Hyperlipidemia   . Hx of breast cancer   . Allergic rhinitis, cause unspecified   . Hypertension   . right shoulder pain, likely Impingement syndrome  09/09/2010  . OBESITY NOS 03/20/2006    Qualifier: Diagnosis of  By: Natalie Musa MD, Natalie D.   . history of Bilateral leg edema-likely amlodipine induced. 07/28/2011  . history of CAP (community acquired pneumonia) 07/14/2011  . GERD (gastroesophageal reflux disease) 05/19/2011  . OSTEOARTHRITIS 12/13/2005    Annotation: bilateral knees;right knee injection 6/05 Qualifier: Diagnosis of  By: Natalie Musa MD, Natalie D.   . VENOUS INSUFFICIENCY, CHRONIC 03/20/2006    Qualifier: Diagnosis of  By: Natalie Musa MD, Natalie D.   . history of HYPOTHYROIDISM, BORDERLINE 03/20/2006    Annotation: Asymptomatic, untreated Qualifier: Diagnosis of  By: Natalie Musa MD, [name].    Current Outpatient Prescriptions  Medication Sig Dispense Refill  . acetaminophen-codeine (TYLENOL #3) 300-30 MG per tablet Take 1 tablet by mouth every 4 (four) hours as needed.  30 tablet  0  . albuterol (PROVENTIL HFA;VENTOLIN HFA) 108 (90 BASE) MCG/ACT inhaler Inhale 2 puffs into the lungs every 6 (six) hours as needed for wheezing.  1 Inhaler  5  . carvedilol (COREG) 12.5 MG tablet Take 1 tablet (12.5 mg total) by mouth 2 (two) times daily with a meal. Take 1 tab twice a day.  60 tablet  1  . enalapril (VASOTEC) 20 MG tablet  Take 1 tablet (20 mg total) by mouth 2 (two) times daily.  90 tablet  4  . hydrochlorothiazide (HYDRODIURIL) 12.5 MG tablet Take 2 tablets (25 mg total) by mouth daily.  30 tablet  3  . pantoprazole (PROTONIX) 40 MG tablet Take 1 tablet (40 mg total) by mouth daily.  30 tablet  11  . potassium chloride SA (K-DUR,KLOR-CON) 20 MEQ tablet Take 2 tablets (40 mEq total) by mouth daily. Take according to the instructions provided by Dr. Candy [name].  60 tablet  3  . simvastatin (ZOCOR) 20 MG tablet Take 1 tablet (20 mg total) by mouth at bedtime.  90 tablet  4   Family History  Problem Relation Age of Onset  . Hypertension Mother   . Alzheimer's disease Mother   . Diabetes Sister    History   Social History  . Marital Status: Legally Separated    Spouse Name: N/A    Number of Children: N/A  . Years of Education: N/A   Occupational History  . not working    Social History Main Topics  . Smoking status: Former Smoker    Quit date: 09/09/1958  . Smokeless tobacco: Never Used  . Alcohol Use: No  . Drug Use: No  . Sexually Active: None   Other Topics Concern  . None   Social History Narrative   Lives with daughter.  Review of Systems: Constitutional: Denies fever, chills, diaphoresis, appetite change and fatigue.  HEENT: Denies photophobia, eye pain, redness, hearing loss, ear pain, congestion, sore throat, rhinorrhea, sneezing, mouth sores, trouble swallowing, neck pain, neck stiffness and tinnitus.   Respiratory: Denies SOB, DOE, cough, chest tightness,  and wheezing.   Cardiovascular: Denies chest pain, palpitations and leg swelling.  Gastrointestinal: Denies nausea, vomiting, abdominal pain, diarrhea, constipation, blood in stool and abdominal distention.  Genitourinary: Denies dysuria, urgency, frequency, hematuria, flank pain and difficulty urinating.  Musculoskeletal: Denies myalgias, back pain, joint swelling, arthralgias and gait problem.  Skin: Denies pallor, rash and wound.    Neurological: Denies dizziness, seizures, syncope, weakness, light-headedness, numbness and headaches.  Hematological: Denies adenopathy. Easy bruising, personal or family bleeding history  Psychiatric/Behavioral: Denies suicidal ideation, mood changes, confusion, nervousness, sleep disturbance and agitation  Objective:  Physical Exam: Filed Vitals:   11/08/11 1030  BP: 115/77  Pulse: 76  Temp: 97.3 F (36.3 C)  TempSrc: Oral  Height: 5\' 2"  (1.575 m)  Weight: 240 lb 4.8 oz (108.999 kg)  SpO2: 95%   Constitutional: Vital signs reviewed.  Patient is a well-developed and well-nourished woman in no acute distress and cooperative with exam. Alert and oriented x3.  Head: Normocephalic and atraumatic Ear: TM normal bilaterally Mouth: no erythema or exudates, MMM Eyes: PERRL, EOMI, conjunctivae normal, No scleral icterus.  Neck: Supple, Trachea midline normal ROM, No JVD, mass, thyromegaly, or carotid bruit present.  Cardiovascular: RRR, S1 normal, S2 normal, no MRG, pulses symmetric and intact bilaterally Pulmonary/Chest: CTAB, no wheezes, rales, or rhonchi Abdominal: Soft. Non-tender, non-distended, bowel sounds are normal, no masses, organomegaly, or guarding present.  GU: no CVA tenderness Musculoskeletal: No joint deformities, erythema, or stiffness, ROM full and no nontender Hematology: no cervical, inginal, or axillary adenopathy.  Neurological: A&O x3, Strength is normal and symmetric bilaterally, cranial nerve II-XII are grossly intact, no focal motor deficit, sensory intact to light touch bilaterally.  Skin: Warm, dry and intact. No rash, cyanosis, or clubbing.  Psychiatric: Normal mood and affect. speech and behavior is normal. Judgment and thought content normal. Cognition and memory are normal.   Assessment & Plan:

## 2011-11-10 ENCOUNTER — Other Ambulatory Visit: Payer: Self-pay | Admitting: *Deleted

## 2011-11-10 NOTE — Telephone Encounter (Signed)
Pt called for refill on Tylenol #3, she got # 30 on 8/20.  She states she takes 3 a day. Will you increase amount to # 90. Pt last seen 9/3 and thought refill was done then.

## 2011-11-11 MED ORDER — ACETAMINOPHEN-CODEINE #3 300-30 MG PO TABS: 1.0000 | ORAL_TABLET | ORAL | Status: DC | PRN

## 2011-11-11 NOTE — Telephone Encounter (Signed)
Rx called in 

## 2011-11-11 NOTE — Telephone Encounter (Signed)
Rx filled #90. Needs to be phoned in.

## 2011-11-14 ENCOUNTER — Encounter (INDEPENDENT_AMBULATORY_CARE_PROVIDER_SITE_OTHER): Payer: Medicare Other | Admitting: Ophthalmology

## 2011-11-14 DIAGNOSIS — H348392 Tributary (branch) retinal vein occlusion, unspecified eye, stable: Secondary | ICD-10-CM

## 2011-11-14 DIAGNOSIS — H43819 Vitreous degeneration, unspecified eye: Secondary | ICD-10-CM

## 2011-11-14 DIAGNOSIS — H2510 Age-related nuclear cataract, unspecified eye: Secondary | ICD-10-CM

## 2011-11-14 DIAGNOSIS — H35039 Hypertensive retinopathy, unspecified eye: Secondary | ICD-10-CM

## 2011-11-14 DIAGNOSIS — I10 Essential (primary) hypertension: Secondary | ICD-10-CM

## 2011-12-19 ENCOUNTER — Encounter (INDEPENDENT_AMBULATORY_CARE_PROVIDER_SITE_OTHER): Payer: Medicare Other | Admitting: Ophthalmology

## 2011-12-19 DIAGNOSIS — H2510 Age-related nuclear cataract, unspecified eye: Secondary | ICD-10-CM

## 2011-12-19 DIAGNOSIS — H35039 Hypertensive retinopathy, unspecified eye: Secondary | ICD-10-CM

## 2011-12-19 DIAGNOSIS — H348392 Tributary (branch) retinal vein occlusion, unspecified eye, stable: Secondary | ICD-10-CM

## 2011-12-19 DIAGNOSIS — H43819 Vitreous degeneration, unspecified eye: Secondary | ICD-10-CM

## 2011-12-19 DIAGNOSIS — I10 Essential (primary) hypertension: Secondary | ICD-10-CM

## 2012-01-01 NOTE — Assessment & Plan Note (Signed)
Lab Results  Component Value Date   NA 139 09/02/2011   K 4.0 09/02/2011   CL 101 09/02/2011   CO2 25 09/02/2011   BUN 9 09/02/2011   CREATININE 0.88 09/02/2011   CREATININE 0.84 06/30/2011    BP Readings from Last 3 Encounters:  11/08/11 115/77  10/07/11 118/67  09/02/11 149/84    Assessment: Hypertension control:  controlled  Progress toward goals:  at goal Barriers to meeting goals:  no barriers identified  Plan: Hypertension treatment:  We will continue her on her current Coreg dose and switch her Enalapril and HCTZ to a combination Lisinopril/HCTZ for convenience and cost.

## 2012-01-06 ENCOUNTER — Telehealth: Payer: Self-pay | Admitting: *Deleted

## 2012-01-06 NOTE — Telephone Encounter (Signed)
[name], I do not see this on her medication list or an indication to take this chronically on her problem list. Is she having diarrhea? If so, I would advise her to come and be seen. It is not always a good idea to take loperamide with diarrhea if an infection is at play. I've also never met this patient. Thanks.  Jonah Blue

## 2012-01-06 NOTE — Telephone Encounter (Signed)
Needs refill on Loperamide 2mg  #30 - last filled 06/30/11

## 2012-01-06 NOTE — Telephone Encounter (Signed)
Talked with pt - no diarrhea now - has appt 01/19/12 with Dr Kem Kays. If any change and needs med for diarrhea - will call clinic for appt.

## 2012-01-16 ENCOUNTER — Other Ambulatory Visit: Payer: Self-pay | Admitting: *Deleted

## 2012-01-16 DIAGNOSIS — I10 Essential (primary) hypertension: Secondary | ICD-10-CM

## 2012-01-16 MED ORDER — CARVEDILOL 6.25 MG PO TABS
6.2500 mg | ORAL_TABLET | Freq: Two times a day (BID) | ORAL | Status: DC
Start: 2012-01-16 — End: 2012-04-17

## 2012-01-16 NOTE — Telephone Encounter (Signed)
Patient has not had a basic metabolic profile test since June. Advised to get it next visit hopefully next month.

## 2012-01-16 NOTE — Telephone Encounter (Signed)
Rx called in to pharmacy. 

## 2012-01-19 ENCOUNTER — Encounter: Payer: Self-pay | Admitting: Internal Medicine

## 2012-01-19 ENCOUNTER — Ambulatory Visit (INDEPENDENT_AMBULATORY_CARE_PROVIDER_SITE_OTHER): Payer: Medicare Other | Admitting: Internal Medicine

## 2012-01-19 VITALS — BP 170/87 | HR 73 | Temp 97.3°F | Ht 62.5 in | Wt 238.3 lb

## 2012-01-19 DIAGNOSIS — Z23 Encounter for immunization: Secondary | ICD-10-CM

## 2012-01-19 DIAGNOSIS — R7302 Impaired glucose tolerance (oral): Secondary | ICD-10-CM

## 2012-01-19 DIAGNOSIS — Z853 Personal history of malignant neoplasm of breast: Secondary | ICD-10-CM

## 2012-01-19 DIAGNOSIS — R7309 Other abnormal glucose: Secondary | ICD-10-CM

## 2012-01-19 DIAGNOSIS — M545 Low back pain: Secondary | ICD-10-CM

## 2012-01-19 DIAGNOSIS — G8929 Other chronic pain: Secondary | ICD-10-CM

## 2012-01-19 DIAGNOSIS — M4306 Spondylolysis, lumbar region: Secondary | ICD-10-CM

## 2012-01-19 DIAGNOSIS — I10 Essential (primary) hypertension: Secondary | ICD-10-CM

## 2012-01-19 LAB — COMPLETE METABOLIC PANEL WITH GFR
Albumin: 3.5 g/dL (ref 3.5–5.2)
CO2: 28 mEq/L (ref 19–32)
GFR, Est African American: 79 mL/min
GFR, Est Non African American: 68 mL/min
Glucose, Bld: 95 mg/dL (ref 70–99)
Sodium: 139 mEq/L (ref 135–145)
Total Bilirubin: 0.5 mg/dL (ref 0.3–1.2)
Total Protein: 7.1 g/dL (ref 6.0–8.3)

## 2012-01-19 LAB — HEMOGLOBIN A1C: Hgb A1c MFr Bld: 6 % — ABNORMAL HIGH (ref <5.7)

## 2012-01-19 NOTE — Progress Notes (Signed)
  Subjective:    Patient ID: [name], female    DOB: November 26, 1935, 76 y.o.   MRN: [id number]  HPI Presents today as a follow up.  She brings some of her medications today and expresses confusion on what she should be taking.  She brings me two bottles of coreg, one labeled "12.5 mg PO bid" and one labeled "6.25 mg po bid" and states she is taking both. In addition, she brings a bottle of enalapril 20 mg which she is taking one tablet daily in addition to lisinopril/hctz 20/25 mg which she is taking one tablet daily. Denies dizziness, denies syncope or presyncope. With her permission, I discarded the enalapril bottle and the coreg 12.5 mg bottle. I educated her on the proper medications she should be taking. She states she gets back pain when she "walks a lot" and it does not radiate to her legs. She states it mostly occurs on the "right side". She admits rest and her current medication help.  She also states her right knee hurts with extended walking, which resolves with rest. She denies any history of trauma or fall.  She has hx of breast CA and is due for her yearly diagnostic mammogram.  Review of Systems Complete 12 point review of systems is otherwise negative except for that stated in the HPI.    Objective:   Physical Exam Filed Vitals:   01/19/12 1022  BP: 170/87  Pulse: 73  Temp: 97.3 F (36.3 C)  BP rechecked by me: 138/84 on left arm at heart level.  GEN: AAOx3, NAD. HEENT: EOMI, PERRLA, no icterus, no adenopathy, moist mucosa. CV: S1S2, no m/r/g, RRR. PULM: CTA bilat. ABD/GI: Soft, obese, NT, +BS, no guarding, no distention, no palpable masses. LE/UE: 2/4 pulses, no c/c/e. Right knee with FROM active and passive without effusion or tenderness noted. NEURO: CN II-XII intact, no focal deficits on my exam. MS: No lumbar or thoracic midline tenderness. No paraspinal tenderness. FROM passive and active of lumbar/thoracic region.  Assessment & Plan:  76 yr. Old female w/ pmhx  significant for HTN, morbid obesity Body mass index is 42.89 kg/(m^2)., chronic lower back pain, venous insufficiency, presents for follow up. 1) HTN: She was taking two ACE-i and a higher dose of coreg than prescribed.  She is also on potassium supplementation due to hx of hypokalemia. Check BMP today stat. Check urine for albuminuria. 2) Chronic lower back pain: she has evidence of lower spodylosis on previous lumbar film. She declines orthopedic referral at this time. She wants to continue taking current tylenol/codeine for pain which is helping her. 3) Glucose intolerance: Previous Hba1c was 6.3%. Recheck today. 4) hx breast CA: repeat yearly mammogram. -flu vaccine today. -return to clinic in 3 months.  Jonah Blue

## 2012-01-19 NOTE — Patient Instructions (Signed)
-  return to clinic in three months. -blood work today. -call me if you have any other questions about your medications. -flu vaccine today.

## 2012-01-20 LAB — MICROALBUMIN / CREATININE URINE RATIO: Microalb Creat Ratio: 3.6 mg/g (ref 0.0–30.0)

## 2012-01-30 ENCOUNTER — Encounter (INDEPENDENT_AMBULATORY_CARE_PROVIDER_SITE_OTHER): Payer: Medicare Other | Admitting: Ophthalmology

## 2012-01-31 ENCOUNTER — Encounter (INDEPENDENT_AMBULATORY_CARE_PROVIDER_SITE_OTHER): Payer: Medicare Other | Admitting: Ophthalmology

## 2012-01-31 DIAGNOSIS — H348392 Tributary (branch) retinal vein occlusion, unspecified eye, stable: Secondary | ICD-10-CM

## 2012-01-31 DIAGNOSIS — H35039 Hypertensive retinopathy, unspecified eye: Secondary | ICD-10-CM

## 2012-01-31 DIAGNOSIS — H43819 Vitreous degeneration, unspecified eye: Secondary | ICD-10-CM

## 2012-01-31 DIAGNOSIS — H2510 Age-related nuclear cataract, unspecified eye: Secondary | ICD-10-CM

## 2012-01-31 DIAGNOSIS — I10 Essential (primary) hypertension: Secondary | ICD-10-CM

## 2012-02-06 ENCOUNTER — Ambulatory Visit
Admission: RE | Admit: 2012-02-06 | Discharge: 2012-02-06 | Disposition: A | Discharge status: Home / Self Care | Payer: Medicare Other | Source: Ambulatory Visit | Attending: Internal Medicine | Admitting: Internal Medicine

## 2012-02-06 DIAGNOSIS — Z853 Personal history of malignant neoplasm of breast: Secondary | ICD-10-CM

## 2012-02-15 ENCOUNTER — Other Ambulatory Visit: Payer: Self-pay | Admitting: *Deleted

## 2012-02-16 MED ORDER — ACETAMINOPHEN-CODEINE #3 300-30 MG PO TABS: 1.0000 | ORAL_TABLET | ORAL | Status: DC | PRN

## 2012-02-16 NOTE — Telephone Encounter (Signed)
Called to pharm 

## 2012-03-13 ENCOUNTER — Encounter (INDEPENDENT_AMBULATORY_CARE_PROVIDER_SITE_OTHER): Payer: Medicare Other | Admitting: Ophthalmology

## 2012-03-21 ENCOUNTER — Encounter (INDEPENDENT_AMBULATORY_CARE_PROVIDER_SITE_OTHER): Payer: Medicare Other | Admitting: Ophthalmology

## 2012-03-21 DIAGNOSIS — H348392 Tributary (branch) retinal vein occlusion, unspecified eye, stable: Secondary | ICD-10-CM

## 2012-03-21 DIAGNOSIS — I10 Essential (primary) hypertension: Secondary | ICD-10-CM

## 2012-03-21 DIAGNOSIS — H2510 Age-related nuclear cataract, unspecified eye: Secondary | ICD-10-CM

## 2012-03-21 DIAGNOSIS — H35039 Hypertensive retinopathy, unspecified eye: Secondary | ICD-10-CM

## 2012-03-21 DIAGNOSIS — H43819 Vitreous degeneration, unspecified eye: Secondary | ICD-10-CM

## 2012-04-17 ENCOUNTER — Other Ambulatory Visit: Payer: Self-pay | Admitting: *Deleted

## 2012-04-17 DIAGNOSIS — E876 Hypokalemia: Secondary | ICD-10-CM

## 2012-04-17 DIAGNOSIS — I10 Essential (primary) hypertension: Secondary | ICD-10-CM

## 2012-04-17 MED ORDER — CARVEDILOL 6.25 MG PO TABS: 6.2500 mg | ORAL_TABLET | Freq: Two times a day (BID) | ORAL | Status: DC

## 2012-04-17 MED ORDER — POTASSIUM CHLORIDE CRYS ER 20 MEQ PO TBCR: 40.0000 meq | EXTENDED_RELEASE_TABLET | Freq: Every day | ORAL | Status: DC

## 2012-04-17 NOTE — Telephone Encounter (Signed)
Pt is out of meds

## 2012-04-19 ENCOUNTER — Ambulatory Visit: Payer: Medicare Other | Admitting: Internal Medicine

## 2012-04-25 ENCOUNTER — Encounter (INDEPENDENT_AMBULATORY_CARE_PROVIDER_SITE_OTHER): Payer: Medicare Other | Admitting: Ophthalmology

## 2012-04-25 DIAGNOSIS — I10 Essential (primary) hypertension: Secondary | ICD-10-CM

## 2012-04-25 DIAGNOSIS — H43819 Vitreous degeneration, unspecified eye: Secondary | ICD-10-CM

## 2012-04-25 DIAGNOSIS — H2510 Age-related nuclear cataract, unspecified eye: Secondary | ICD-10-CM

## 2012-04-25 DIAGNOSIS — H35039 Hypertensive retinopathy, unspecified eye: Secondary | ICD-10-CM

## 2012-04-25 DIAGNOSIS — H348392 Tributary (branch) retinal vein occlusion, unspecified eye, stable: Secondary | ICD-10-CM

## 2012-04-26 ENCOUNTER — Ambulatory Visit (INDEPENDENT_AMBULATORY_CARE_PROVIDER_SITE_OTHER): Payer: Medicare Other | Admitting: Internal Medicine

## 2012-04-26 ENCOUNTER — Encounter: Payer: Self-pay | Admitting: Internal Medicine

## 2012-04-26 VITALS — BP 153/91 | HR 66 | Temp 96.9°F | Ht 62.0 in | Wt 237.7 lb

## 2012-04-26 DIAGNOSIS — E785 Hyperlipidemia, unspecified: Secondary | ICD-10-CM

## 2012-04-26 DIAGNOSIS — G8929 Other chronic pain: Secondary | ICD-10-CM

## 2012-04-26 DIAGNOSIS — I10 Essential (primary) hypertension: Secondary | ICD-10-CM

## 2012-04-26 LAB — COMPLETE METABOLIC PANEL WITH GFR
AST: 13 U/L (ref 0–37)
Albumin: 3.8 g/dL (ref 3.5–5.2)
Alkaline Phosphatase: 48 U/L (ref 39–117)
BUN: 17 mg/dL (ref 6–23)
Creat: 0.59 mg/dL (ref 0.50–1.10)
Potassium: 4.1 mEq/L (ref 3.5–5.3)

## 2012-04-26 LAB — LIPID PANEL
LDL Cholesterol: 135 mg/dL — ABNORMAL HIGH (ref 0–99)
Triglycerides: 65 mg/dL (ref <150)
VLDL: 13 mg/dL (ref 0–40)

## 2012-04-26 MED ORDER — SIMVASTATIN 20 MG PO TABS: 20.0000 mg | ORAL_TABLET | Freq: Every day | ORAL | Status: DC

## 2012-04-26 MED ORDER — AMLODIPINE BESYLATE 2.5 MG PO TABS: 2.5000 mg | ORAL_TABLET | Freq: Every day | ORAL | Status: DC

## 2012-04-26 NOTE — Patient Instructions (Signed)
Return in 2 months. Blood work today. Start amlodipine 2.5 mg daily.

## 2012-04-26 NOTE — Progress Notes (Signed)
  Subjective:    Patient ID: [name], female    DOB: 06-18-1935, 77 y.o.   MRN: [id number]  HPI Feels well.  Denies CP, SOB.  Denies abdominal pain.  Denies melena, hematochezia.  States regular bowel movements.  Denies any urinary difficulties or discomfort.  She is taking all appropriate medications this time and brings them all to this visit. States she visited her "eye doctor" yesterday. I discussed daily adherence to all medications, and she understands. She has no further complaints.   Review of Systems Complete 12 point review of systems otherwise negative except for that stated in the HPI.    Objective:   Physical Exam Filed Vitals:   04/26/12 1021  BP: 153/91  Pulse: 66  Temp:   Temp 96.9 F  GEN: AAOx3, NAD. HEENT: EOMI, PERRLA, no icterus, no adenopathy. CV: S1S2, no m/r/g, RRR PULM: CTA bilat. ABD/GI: Soft, NT, +BS, no guarding. LE/UE: 2/4 pulses, no c/c/e. No lesions. NEURO: CN II-XII intact, no focal deficits.     Assessment & Plan:  77 yr. Old female w/ pmhx significant for HTN, morbid obesity Body mass index is 43.46 kg/(m^2)., chronic lower back pain, venous insufficiency, presents for follow up.  1) HTN: Not well controlled.  Add amlodipine 2.5 mg po daily. Repeat BMP today due to K supplementation. Adherence discussed. 2) Hyperlipidemia: calculated 10 year ASCVD risk is 20.2%. Agrees to benefit of mod-high intensity statin. She is on simvastatin 20 mg daily, though I doubt adherence while looking at her previous LDL trend. This was discussed with her. Simvastatin was sent to her pharmacy.  3) Chronic lower back pain: she has evidence of lower spodylosis on previous lumbar film. She declines orthopedic referral at this time. She wants to continue taking current tylenol/codeine for pain which is helping her.  4) Glucose intolerance: Previous Hba1c was 6.0%. No need for treatment.  5) hx breast CA: repeat yearly mammogram. She had one 11/13 that was BIRADS 2. 6)  Health Maintenance: mammogram done.  Colonoscopy due on 2017. Flu vaccine UTD.  Pneumovax UTD.  Tdap UTD.

## 2012-05-10 ENCOUNTER — Other Ambulatory Visit: Payer: Self-pay | Admitting: *Deleted

## 2012-05-10 MED ORDER — ACETAMINOPHEN-CODEINE #3 300-30 MG PO TABS: 1.0000 | ORAL_TABLET | ORAL | Status: DC | PRN

## 2012-05-10 NOTE — Telephone Encounter (Signed)
Can this be called in? I signed the new Rx.

## 2012-05-10 NOTE — Telephone Encounter (Signed)
Rx called in 

## 2012-05-10 NOTE — Telephone Encounter (Signed)
Last refilled # 90 on 2/8   Per pharmacy. Pt thought you refilled during last visit 2/20

## 2012-05-23 ENCOUNTER — Encounter (INDEPENDENT_AMBULATORY_CARE_PROVIDER_SITE_OTHER): Payer: Medicare Other | Admitting: Ophthalmology

## 2012-05-25 ENCOUNTER — Encounter (INDEPENDENT_AMBULATORY_CARE_PROVIDER_SITE_OTHER): Payer: Medicare Other | Admitting: Ophthalmology

## 2012-05-25 DIAGNOSIS — H2510 Age-related nuclear cataract, unspecified eye: Secondary | ICD-10-CM

## 2012-05-25 DIAGNOSIS — H35039 Hypertensive retinopathy, unspecified eye: Secondary | ICD-10-CM

## 2012-05-25 DIAGNOSIS — I10 Essential (primary) hypertension: Secondary | ICD-10-CM

## 2012-05-25 DIAGNOSIS — H348392 Tributary (branch) retinal vein occlusion, unspecified eye, stable: Secondary | ICD-10-CM

## 2012-05-25 DIAGNOSIS — H43819 Vitreous degeneration, unspecified eye: Secondary | ICD-10-CM

## 2012-05-28 ENCOUNTER — Other Ambulatory Visit: Payer: Self-pay | Admitting: *Deleted

## 2012-05-28 DIAGNOSIS — I10 Essential (primary) hypertension: Secondary | ICD-10-CM

## 2012-05-28 MED ORDER — CARVEDILOL 6.25 MG PO TABS: 6.2500 mg | ORAL_TABLET | Freq: Two times a day (BID) | ORAL | Status: DC

## 2012-06-01 ENCOUNTER — Other Ambulatory Visit: Payer: Self-pay | Admitting: Internal Medicine

## 2012-06-29 ENCOUNTER — Encounter (INDEPENDENT_AMBULATORY_CARE_PROVIDER_SITE_OTHER): Payer: Medicare Other | Admitting: Ophthalmology

## 2012-07-05 ENCOUNTER — Ambulatory Visit (INDEPENDENT_AMBULATORY_CARE_PROVIDER_SITE_OTHER): Payer: Medicare Other | Admitting: Internal Medicine

## 2012-07-05 ENCOUNTER — Encounter: Payer: Self-pay | Admitting: Internal Medicine

## 2012-07-05 VITALS — BP 140/77 | HR 69 | Temp 97.1°F | Ht 62.0 in | Wt 238.8 lb

## 2012-07-05 DIAGNOSIS — Z13820 Encounter for screening for osteoporosis: Secondary | ICD-10-CM

## 2012-07-05 DIAGNOSIS — E2839 Other primary ovarian failure: Secondary | ICD-10-CM

## 2012-07-05 DIAGNOSIS — I10 Essential (primary) hypertension: Secondary | ICD-10-CM

## 2012-07-05 MED ORDER — ZOSTER VACCINE LIVE 19400 UNT/0.65ML ~~LOC~~ SOLR: 0.6500 mL | Freq: Once | SUBCUTANEOUS | Status: DC

## 2012-07-05 MED ORDER — AMLODIPINE BESYLATE 5 MG PO TABS: 5.0000 mg | ORAL_TABLET | Freq: Every day | ORAL | Status: DC

## 2012-07-05 MED ORDER — ASPIRIN EC 81 MG PO TBEC: 81.0000 mg | DELAYED_RELEASE_TABLET | Freq: Every day | ORAL | Status: AC

## 2012-07-05 MED ORDER — PANTOPRAZOLE SODIUM 40 MG PO TBEC: 40.0000 mg | DELAYED_RELEASE_TABLET | Freq: Every day | ORAL | Status: DC

## 2012-07-05 NOTE — Patient Instructions (Addendum)
Increase simvastatin to 40 mg at night. Increase amlodipine to 5 mg a day. Remember to get your shingles vaccine at [facility]. Remember to do your bone density test. Return to clinic in 3 months.

## 2012-07-05 NOTE — Progress Notes (Signed)
  Subjective:    Patient ID: [name], female    DOB: Sep 18, 1935, 77 y.o.   MRN: [id number]  HPI Feels well. Denies CP, SOB, N/V/D/C. No melena, no hematochezia. No PND, no orthopnea. Back pain is well controlled. No falls.   Review of Systems Complete 12 point review of systems otherwise negative except for that stated in the HPI.    Objective:   Physical Exam Filed Vitals:   07/05/12 0829  BP: 140/77  Pulse: 69  Temp: 97.1 F (36.2 C)   GEN: AAOx3, NAD. HEENT: EOMI, PERRLA, no icterus, no adenopathy. CV: S1S2, no m/r/g, RRR. PULM: CTA bilat. ABD/GI: Soft, NT, +BS, no guarding, no distention. LE: 2/4 pulses, no c/c/e. NEURO: CN II-XII intact, no focal deficits.       Assessment & Plan:  77 yr. Old female w/ pmhx significant for HTN, morbid obesity Body mass index is 42.89 kg/(m^2)., chronic lower back pain, venous insufficiency, presents for follow up.  1) HTN: Not at goal. Increase amlodipine to 5 mg daily. 2) HL: Ten year risk is 16.4%. Increase simvastatin to 40 mg at night.  Start ASA 81 mg po daily. 2) Chronic lower back pain: she has evidence of lower spodylosis on previous lumbar film. She declines orthopedic referral at this time. She wants to continue taking current tylenol/codeine for pain which is helping her.  3) Glucose intolerance: Previous Hba1c was 6.0%.  No need to start treatment. Diet. 4) hx breast CA: BIRADS 2 on 02/2012. Repeat in one year. 5) health maintenance: send for Zostavax. Send for bone density. -return to clinic in 3 months.

## 2012-07-06 ENCOUNTER — Encounter (INDEPENDENT_AMBULATORY_CARE_PROVIDER_SITE_OTHER): Payer: Medicare Other | Admitting: Ophthalmology

## 2012-07-11 ENCOUNTER — Encounter (INDEPENDENT_AMBULATORY_CARE_PROVIDER_SITE_OTHER): Payer: Medicare Other | Admitting: Ophthalmology

## 2012-07-11 DIAGNOSIS — H35039 Hypertensive retinopathy, unspecified eye: Secondary | ICD-10-CM

## 2012-07-11 DIAGNOSIS — H2510 Age-related nuclear cataract, unspecified eye: Secondary | ICD-10-CM

## 2012-07-11 DIAGNOSIS — H348392 Tributary (branch) retinal vein occlusion, unspecified eye, stable: Secondary | ICD-10-CM

## 2012-07-11 DIAGNOSIS — H43819 Vitreous degeneration, unspecified eye: Secondary | ICD-10-CM

## 2012-07-11 DIAGNOSIS — I10 Essential (primary) hypertension: Secondary | ICD-10-CM

## 2012-07-24 ENCOUNTER — Other Ambulatory Visit: Payer: Medicare Other

## 2012-07-24 ENCOUNTER — Other Ambulatory Visit: Payer: Self-pay | Admitting: *Deleted

## 2012-07-24 DIAGNOSIS — M25511 Pain in right shoulder: Secondary | ICD-10-CM

## 2012-07-25 MED ORDER — ACETAMINOPHEN-CODEINE 300-30 MG PO TABS: 1.0000 | ORAL_TABLET | Freq: Two times a day (BID) | ORAL | Status: DC | PRN

## 2012-07-27 NOTE — Telephone Encounter (Signed)
rx called in

## 2012-08-07 ENCOUNTER — Ambulatory Visit
Admission: RE | Admit: 2012-08-07 | Discharge: 2012-08-07 | Disposition: A | Discharge status: Home / Self Care | Payer: Medicare Other | Source: Ambulatory Visit | Attending: Internal Medicine | Admitting: Internal Medicine

## 2012-08-07 DIAGNOSIS — Z13820 Encounter for screening for osteoporosis: Secondary | ICD-10-CM

## 2012-08-07 DIAGNOSIS — E2839 Other primary ovarian failure: Secondary | ICD-10-CM

## 2012-08-09 ENCOUNTER — Encounter (INDEPENDENT_AMBULATORY_CARE_PROVIDER_SITE_OTHER): Payer: Medicare Other | Admitting: Ophthalmology

## 2012-08-13 ENCOUNTER — Encounter (INDEPENDENT_AMBULATORY_CARE_PROVIDER_SITE_OTHER): Payer: Medicare Other | Admitting: Ophthalmology

## 2012-08-17 ENCOUNTER — Encounter (INDEPENDENT_AMBULATORY_CARE_PROVIDER_SITE_OTHER): Payer: Medicare Other | Admitting: Ophthalmology

## 2012-08-17 DIAGNOSIS — H2510 Age-related nuclear cataract, unspecified eye: Secondary | ICD-10-CM

## 2012-08-17 DIAGNOSIS — H35039 Hypertensive retinopathy, unspecified eye: Secondary | ICD-10-CM

## 2012-08-17 DIAGNOSIS — H348392 Tributary (branch) retinal vein occlusion, unspecified eye, stable: Secondary | ICD-10-CM

## 2012-08-17 DIAGNOSIS — I10 Essential (primary) hypertension: Secondary | ICD-10-CM

## 2012-08-17 DIAGNOSIS — H43819 Vitreous degeneration, unspecified eye: Secondary | ICD-10-CM

## 2012-09-14 ENCOUNTER — Encounter (INDEPENDENT_AMBULATORY_CARE_PROVIDER_SITE_OTHER): Payer: Medicare Other | Admitting: Ophthalmology

## 2012-09-17 ENCOUNTER — Encounter (INDEPENDENT_AMBULATORY_CARE_PROVIDER_SITE_OTHER): Payer: Medicare Other | Admitting: Ophthalmology

## 2012-09-17 DIAGNOSIS — I10 Essential (primary) hypertension: Secondary | ICD-10-CM

## 2012-09-17 DIAGNOSIS — H43819 Vitreous degeneration, unspecified eye: Secondary | ICD-10-CM

## 2012-09-17 DIAGNOSIS — H35039 Hypertensive retinopathy, unspecified eye: Secondary | ICD-10-CM

## 2012-09-17 DIAGNOSIS — H348392 Tributary (branch) retinal vein occlusion, unspecified eye, stable: Secondary | ICD-10-CM

## 2012-10-15 ENCOUNTER — Other Ambulatory Visit: Payer: Self-pay | Admitting: *Deleted

## 2012-10-15 ENCOUNTER — Encounter (INDEPENDENT_AMBULATORY_CARE_PROVIDER_SITE_OTHER): Payer: Medicare Other | Admitting: Ophthalmology

## 2012-10-15 DIAGNOSIS — M25511 Pain in right shoulder: Secondary | ICD-10-CM

## 2012-10-16 MED ORDER — ALBUTEROL SULFATE HFA 108 (90 BASE) MCG/ACT IN AERS: 2.0000 | INHALATION_SPRAY | Freq: Four times a day (QID) | RESPIRATORY_TRACT | Status: DC | PRN

## 2012-10-16 MED ORDER — ACETAMINOPHEN-CODEINE 300-30 MG PO TABS: 1.0000 | ORAL_TABLET | Freq: Two times a day (BID) | ORAL | Status: DC | PRN

## 2012-10-17 ENCOUNTER — Encounter (INDEPENDENT_AMBULATORY_CARE_PROVIDER_SITE_OTHER): Payer: Medicare HMO | Admitting: Ophthalmology

## 2012-10-17 DIAGNOSIS — I10 Essential (primary) hypertension: Secondary | ICD-10-CM

## 2012-10-17 DIAGNOSIS — H348392 Tributary (branch) retinal vein occlusion, unspecified eye, stable: Secondary | ICD-10-CM

## 2012-10-17 DIAGNOSIS — H43819 Vitreous degeneration, unspecified eye: Secondary | ICD-10-CM

## 2012-10-17 DIAGNOSIS — H2510 Age-related nuclear cataract, unspecified eye: Secondary | ICD-10-CM

## 2012-10-17 DIAGNOSIS — H35039 Hypertensive retinopathy, unspecified eye: Secondary | ICD-10-CM

## 2012-10-17 NOTE — Telephone Encounter (Signed)
Called in.

## 2012-11-12 ENCOUNTER — Encounter (INDEPENDENT_AMBULATORY_CARE_PROVIDER_SITE_OTHER): Payer: Medicare Other | Admitting: Ophthalmology

## 2012-11-14 ENCOUNTER — Other Ambulatory Visit: Payer: Self-pay | Admitting: *Deleted

## 2012-11-14 DIAGNOSIS — I10 Essential (primary) hypertension: Secondary | ICD-10-CM

## 2012-11-14 MED ORDER — AMLODIPINE BESYLATE 5 MG PO TABS: 5.0000 mg | ORAL_TABLET | Freq: Every day | ORAL | Status: DC

## 2012-11-14 NOTE — Telephone Encounter (Signed)
Pt aware.

## 2012-11-15 ENCOUNTER — Encounter (INDEPENDENT_AMBULATORY_CARE_PROVIDER_SITE_OTHER): Payer: Medicare Other | Admitting: Ophthalmology

## 2012-11-15 DIAGNOSIS — H43819 Vitreous degeneration, unspecified eye: Secondary | ICD-10-CM

## 2012-11-15 DIAGNOSIS — H35039 Hypertensive retinopathy, unspecified eye: Secondary | ICD-10-CM

## 2012-11-15 DIAGNOSIS — H348392 Tributary (branch) retinal vein occlusion, unspecified eye, stable: Secondary | ICD-10-CM

## 2012-11-15 DIAGNOSIS — I10 Essential (primary) hypertension: Secondary | ICD-10-CM

## 2012-12-06 ENCOUNTER — Other Ambulatory Visit: Payer: Self-pay | Admitting: Internal Medicine

## 2012-12-13 ENCOUNTER — Other Ambulatory Visit: Payer: Self-pay | Admitting: *Deleted

## 2012-12-13 DIAGNOSIS — E785 Hyperlipidemia, unspecified: Secondary | ICD-10-CM

## 2012-12-13 MED ORDER — SIMVASTATIN 20 MG PO TABS: 20.0000 mg | ORAL_TABLET | Freq: Every day | ORAL | Status: DC

## 2012-12-14 ENCOUNTER — Encounter (INDEPENDENT_AMBULATORY_CARE_PROVIDER_SITE_OTHER): Payer: Medicare Other | Admitting: Ophthalmology

## 2012-12-15 ENCOUNTER — Other Ambulatory Visit: Payer: Self-pay | Admitting: Internal Medicine

## 2012-12-19 ENCOUNTER — Encounter (INDEPENDENT_AMBULATORY_CARE_PROVIDER_SITE_OTHER): Payer: Medicare Other | Admitting: Ophthalmology

## 2013-01-01 ENCOUNTER — Other Ambulatory Visit: Payer: Self-pay | Admitting: Internal Medicine

## 2013-01-03 ENCOUNTER — Encounter: Payer: Medicare Other | Admitting: Internal Medicine

## 2013-01-07 ENCOUNTER — Encounter (INDEPENDENT_AMBULATORY_CARE_PROVIDER_SITE_OTHER): Payer: Self-pay | Admitting: Ophthalmology

## 2013-01-09 ENCOUNTER — Encounter (INDEPENDENT_AMBULATORY_CARE_PROVIDER_SITE_OTHER): Payer: Medicare Other | Admitting: Ophthalmology

## 2013-01-09 DIAGNOSIS — H35039 Hypertensive retinopathy, unspecified eye: Secondary | ICD-10-CM

## 2013-01-09 DIAGNOSIS — H348392 Tributary (branch) retinal vein occlusion, unspecified eye, stable: Secondary | ICD-10-CM

## 2013-01-09 DIAGNOSIS — I10 Essential (primary) hypertension: Secondary | ICD-10-CM

## 2013-01-09 DIAGNOSIS — H43819 Vitreous degeneration, unspecified eye: Secondary | ICD-10-CM

## 2013-01-10 ENCOUNTER — Other Ambulatory Visit: Payer: Self-pay | Admitting: Internal Medicine

## 2013-02-06 ENCOUNTER — Encounter (INDEPENDENT_AMBULATORY_CARE_PROVIDER_SITE_OTHER): Payer: Medicare Other | Admitting: Ophthalmology

## 2013-02-06 DIAGNOSIS — H43819 Vitreous degeneration, unspecified eye: Secondary | ICD-10-CM

## 2013-02-06 DIAGNOSIS — H348392 Tributary (branch) retinal vein occlusion, unspecified eye, stable: Secondary | ICD-10-CM

## 2013-02-06 DIAGNOSIS — H2510 Age-related nuclear cataract, unspecified eye: Secondary | ICD-10-CM

## 2013-02-06 DIAGNOSIS — I10 Essential (primary) hypertension: Secondary | ICD-10-CM

## 2013-02-06 DIAGNOSIS — H35039 Hypertensive retinopathy, unspecified eye: Secondary | ICD-10-CM

## 2013-02-07 ENCOUNTER — Encounter: Payer: Medicare Other | Admitting: Internal Medicine

## 2013-02-15 ENCOUNTER — Ambulatory Visit (INDEPENDENT_AMBULATORY_CARE_PROVIDER_SITE_OTHER): Payer: Medicare Other | Admitting: Internal Medicine

## 2013-02-15 ENCOUNTER — Encounter: Payer: Self-pay | Admitting: Internal Medicine

## 2013-02-15 VITALS — BP 125/75 | HR 65 | Temp 97.0°F | Ht 60.0 in | Wt 240.5 lb

## 2013-02-15 DIAGNOSIS — Z23 Encounter for immunization: Secondary | ICD-10-CM

## 2013-02-15 DIAGNOSIS — I10 Essential (primary) hypertension: Secondary | ICD-10-CM

## 2013-02-15 DIAGNOSIS — E785 Hyperlipidemia, unspecified: Secondary | ICD-10-CM

## 2013-02-15 DIAGNOSIS — Z139 Encounter for screening, unspecified: Secondary | ICD-10-CM

## 2013-02-15 DIAGNOSIS — R7309 Other abnormal glucose: Secondary | ICD-10-CM

## 2013-02-15 DIAGNOSIS — M25519 Pain in unspecified shoulder: Secondary | ICD-10-CM

## 2013-02-15 DIAGNOSIS — M25511 Pain in right shoulder: Secondary | ICD-10-CM

## 2013-02-15 DIAGNOSIS — Z853 Personal history of malignant neoplasm of breast: Secondary | ICD-10-CM

## 2013-02-15 DIAGNOSIS — R7302 Impaired glucose tolerance (oral): Secondary | ICD-10-CM

## 2013-02-15 LAB — CBC WITH DIFFERENTIAL/PLATELET
Basophils Relative: 1 % (ref 0–1)
Eosinophils Absolute: 0.5 10*3/uL (ref 0.0–0.7)
Eosinophils Relative: 6 % — ABNORMAL HIGH (ref 0–5)
Hemoglobin: 11.8 g/dL — ABNORMAL LOW (ref 12.0–15.0)
MCH: 26.6 pg (ref 26.0–34.0)
MCHC: 33.1 g/dL (ref 30.0–36.0)
Monocytes Absolute: 0.5 10*3/uL (ref 0.1–1.0)
Monocytes Relative: 7 % (ref 3–12)
Neutrophils Relative %: 56 % (ref 43–77)

## 2013-02-15 LAB — HEMOGLOBIN A1C: Hgb A1c MFr Bld: 6.1 % — ABNORMAL HIGH (ref <5.7)

## 2013-02-15 MED ORDER — CALCIUM CITRATE-VITAMIN D 315-200 MG-UNIT PO TABS: 1.0000 | ORAL_TABLET | Freq: Two times a day (BID) | ORAL | Status: DC

## 2013-02-15 MED ORDER — ATORVASTATIN CALCIUM 40 MG PO TABS: 40.0000 mg | ORAL_TABLET | Freq: Every day | ORAL | Status: DC

## 2013-02-15 MED ORDER — ACETAMINOPHEN-CODEINE 300-30 MG PO TABS: 1.0000 | ORAL_TABLET | Freq: Two times a day (BID) | ORAL | Status: DC | PRN

## 2013-02-15 NOTE — Progress Notes (Signed)
   Subjective:    Patient ID: [name], female    DOB: 1935/04/05, 77 y.o.   MRN: [id number]  HPI  Feels well. Denies any N/V/D/C. Denies CP, SOB. Denies melena, hematochezia. She is taking all of her medications. No complaints for me today.  Review of Systems  Constitutional: Negative for fever, chills and activity change.  HENT: Negative for hearing loss and trouble swallowing.   Respiratory: Negative for choking, chest tightness and shortness of breath.   Cardiovascular: Negative for chest pain, palpitations and leg swelling.  Gastrointestinal: Negative for nausea, vomiting, abdominal pain, diarrhea and blood in stool.  Genitourinary: Negative for vaginal bleeding, vaginal discharge and difficulty urinating.  Musculoskeletal: Positive for arthralgias.  Neurological: Negative for dizziness, syncope and weakness.  Psychiatric/Behavioral: Negative for behavioral problems and agitation.       Objective:   Physical Exam  Constitutional: She is oriented to person, place, and time. She appears well-developed and well-nourished. No distress.  HENT:  Head: Normocephalic and atraumatic.  Eyes: Conjunctivae and EOM are normal. Pupils are equal, round, and reactive to light.  Neck: Normal range of motion. Neck supple. No JVD present. No thyromegaly present.  Cardiovascular: Normal rate, regular rhythm and normal heart sounds.  Exam reveals no gallop and no friction rub.   No murmur heard. Pulmonary/Chest: Effort normal and breath sounds normal. No respiratory distress. She has no wheezes. She has no rales.  Abdominal: Soft. Bowel sounds are normal. She exhibits no distension and no mass. There is no tenderness. There is no rebound and no guarding.  Musculoskeletal: Normal range of motion. She exhibits no edema.  Neurological: She is alert and oriented to person, place, and time. She has normal reflexes. No cranial nerve deficit.  Skin: Skin is warm. She is not diaphoretic.  Psychiatric:  She has a normal mood and affect. Her behavior is normal. Judgment and thought content normal.   Filed Vitals:   02/15/13 1054  BP: 125/75  Pulse: 65  Temp: 97 F (36.1 C)        Assessment & Plan:  75 yr. Old female w/ pmhx significant for HTN, morbid obesity Body mass index is 42.89 kg/(m^2)., chronic lower back pain, venous insufficiency, presents for follow up.  1) HTN: Well controlled.   2) HL: Ten year risk is 16.4%.Change simvastatin to Lipitor 40 mg daily.  2) Chronic lower back pain: she has evidence of lower spodylosis on previous lumbar film. She declines orthopedic referral at this time. She wants to continue taking current tylenol/codeine for pain which is helping her, no changes today.  3) Glucose intolerance: Previous Hba1c was 6.0%. Repeat today.  4) hx breast CA: BIRADS 2 on 02/2012. Repeat mammogram. 5) health maintenance: She was not able to get Shingles vaccine through [facility], she is going to try to get this at [facility] or [facility]. DEXA done 08/2012 without evidence of osteopenia or osteoporosis. Vit D3 800 IU daily along with Calcium 1200 mg daily. Flu vaccine today. Colonoscopy done in 11/2008 without polyps. -return to clinic in 3 months.  Jonah Blue, DO, FACP Faculty [facility] Program 02/15/2013, 11:38 AM

## 2013-02-15 NOTE — Patient Instructions (Addendum)
-  Stop simvastatin, I have changed you to Lipitor (Atorvastatin) instead and this was sent to your pharmacy. -Remember to get your Shingles vaccine. -Mammogram referral sent. -Blood work today. -Return in 3 months.

## 2013-02-21 ENCOUNTER — Encounter: Payer: Self-pay | Admitting: *Deleted

## 2013-02-21 ENCOUNTER — Other Ambulatory Visit: Payer: Self-pay | Admitting: Internal Medicine

## 2013-02-21 NOTE — Progress Notes (Unsigned)
Open in error

## 2013-03-12 ENCOUNTER — Other Ambulatory Visit: Payer: Self-pay | Admitting: Internal Medicine

## 2013-03-13 ENCOUNTER — Encounter (INDEPENDENT_AMBULATORY_CARE_PROVIDER_SITE_OTHER): Payer: Commercial Managed Care - HMO | Admitting: Ophthalmology

## 2013-03-21 ENCOUNTER — Telehealth: Payer: Self-pay | Admitting: *Deleted

## 2013-03-21 ENCOUNTER — Other Ambulatory Visit: Payer: Self-pay | Admitting: Internal Medicine

## 2013-03-21 DIAGNOSIS — E11319 Type 2 diabetes mellitus with unspecified diabetic retinopathy without macular edema: Secondary | ICD-10-CM

## 2013-03-21 NOTE — Telephone Encounter (Signed)
SPOKE WITH DEREK YESTERDAY CONCERNING REFERRAL FOR MS [name]. TOLD HIM PATIENT HAS TO COME IN TO [facility] FOR VISIT WITH DOCTOR. WE DID NOT MAKE THE ORIGINAL REFERRAL FOR THIS PATIENT TO HAVE EYE INJECTION. HER INSURANCE IS NOW REQUIRING REFERRAL.  I CAN NOT GIVE OR DO REFERRAL WITHOUT OUR DOCTOR SEEING THIS PATIENT.  FLAG SENT TO DOCTOR PAYA.  PATIENT HAS APPOINTMENT NEXT Friday WITH [facility].  [name] IS AWARE OF MY CONVERSATION WITH DEREK CONCERNING THIS MATTER.  SHE HAS ALSO SPOKEN WITH DEREK CONCERNING THE REFERRAL.  [name] NTII 1-15-015   11:43AM

## 2013-03-29 ENCOUNTER — Ambulatory Visit (INDEPENDENT_AMBULATORY_CARE_PROVIDER_SITE_OTHER): Payer: Medicare HMO | Admitting: Internal Medicine

## 2013-03-29 ENCOUNTER — Encounter: Payer: Self-pay | Admitting: Internal Medicine

## 2013-03-29 ENCOUNTER — Ambulatory Visit: Payer: Medicare Other

## 2013-03-29 VITALS — BP 115/71 | HR 76 | Temp 97.6°F | Ht 61.75 in | Wt 237.6 lb

## 2013-03-29 DIAGNOSIS — H11009 Unspecified pterygium of unspecified eye: Secondary | ICD-10-CM

## 2013-03-29 DIAGNOSIS — H35039 Hypertensive retinopathy, unspecified eye: Secondary | ICD-10-CM | POA: Insufficient documentation

## 2013-03-29 DIAGNOSIS — H11002 Unspecified pterygium of left eye: Secondary | ICD-10-CM

## 2013-03-29 NOTE — Progress Notes (Signed)
Subjective:   Patient ID: [name] female   DOB: October 23, 1935 78 y.o.   MRN: [id number]  Chief Complaint  Patient presents with  . Referrals    Needs 2 referrals to eye doctor - Dr Zigmund Daniel and female doctor [address].    HPI: [name] is a 78 y.o. woman with h/o hypothyroidism, HLD, HTN, and GERD who presents for the above reason.  One opthalmologist injects her eye, the other plans to remove a growth?  Needs referrals to be certified before processed.   Recently seen in clinic on 02/15/13 for routine visit, has no complaints today.   Review of Systems: Constitutional: Denies fever, chills, diaphoresis, appetite change and fatigue.  HEENT: Denies photophobia, eye pain, redness, hearing loss, ear pain, congestion, sore throat, rhinorrhea, sneezing, mouth sores, trouble swallowing, neck pain, neck stiffness and tinnitus.  Respiratory: Denies SOB, DOE, cough, chest tightness, and wheezing.  Cardiovascular: Denies chest pain, palpitations and leg swelling.  Gastrointestinal: Denies nausea, vomiting, abdominal pain, diarrhea, constipation,blood in stool and abdominal distention.  Genitourinary: Denies dysuria, urgency, frequency, hematuria, flank pain and difficulty urinating.  Musculoskeletal: +chronic arthralgias  Skin: Denies pallor, rash and wound.  Neurological: Denies dizziness, seizures, syncope, weakness, lightheadedness, numbness and headaches.    Past Medical History  Diagnosis Date  . Hypothyroidism   . Lumbago   . Hyperlipidemia   . Hx of breast cancer   . Allergic rhinitis, cause unspecified   . Hypertension   . right shoulder pain, likely Impingement syndrome  09/09/2010  . OBESITY NOS 03/20/2006    Qualifier: Diagnosis of  By: Tomasa Hosteller MD, Veronique D.   . history of Bilateral leg edema-likely amlodipine induced. 07/28/2011  . history of CAP (community acquired pneumonia) 07/14/2011  . GERD (gastroesophageal reflux disease) 05/19/2011  . OSTEOARTHRITIS  12/13/2005    Annotation: bilateral knees;right knee injection 6/05 Qualifier: Diagnosis of  By: Tomasa Hosteller MD, Veronique D.   . VENOUS INSUFFICIENCY, CHRONIC 03/20/2006    Qualifier: Diagnosis of  By: Tomasa Hosteller MD, Veronique D.   . history of HYPOTHYROIDISM, BORDERLINE 03/20/2006    Annotation: Asymptomatic, untreated Qualifier: Diagnosis of  By: Tomasa Hosteller MD, Edmon Crape.    Current Outpatient Prescriptions  Medication Sig Dispense Refill  . Acetaminophen-Codeine (TYLENOL/CODEINE #3) 300-30 MG per tablet Take 1 tablet by mouth 2 (two) times daily as needed for pain (take 1 tab twice a day for your shoulder only as you need.).  60 tablet  2  . albuterol (PROVENTIL HFA;VENTOLIN HFA) 108 (90 BASE) MCG/ACT inhaler Inhale 2 puffs into the lungs every 6 (six) hours as needed for wheezing.  1 Inhaler  5  . amLODipine (NORVASC) 5 MG tablet TAKE ONE TABLET BY MOUTH ONCE DAILY  30 tablet  3  . aspirin EC 81 MG tablet Take 1 tablet (81 mg total) by mouth daily.  30 tablet  5  . atorvastatin (LIPITOR) 40 MG tablet Take 1 tablet (40 mg total) by mouth daily.  90 tablet  3  . calcium citrate-vitamin D (CITRACAL+D) 315-200 MG-UNIT per tablet Take 1 tablet by mouth 2 (two) times daily.  120 tablet  3  . carvedilol (COREG) 6.25 MG tablet TAKE ONE TABLET BY MOUTH TWICE DAILY WITH MEALS  60 tablet  1  . KLOR-CON M20 20 MEQ tablet TAKE TWO TABLETS BY MOUTH ONCE DAILY  60 tablet  1  . lisinopril-hydrochlorothiazide (PRINZIDE,ZESTORETIC) 20-25 MG per tablet TAKE ONE TABLET BY MOUTH ONCE DAILY  90 tablet  1  .  pantoprazole (PROTONIX) 40 MG tablet TAKE ONE TABLET BY MOUTH ONCE DAILY  30 tablet  2  . zoster vaccine live, PF, (ZOSTAVAX) 35597 UNT/0.65ML injection Inject 19,400 Units into the skin once.  1 each  0   No current facility-administered medications for this visit.   Family History  Problem Relation Age of Onset  . Hypertension Mother   . Alzheimer's disease Mother   . Diabetes Sister    History   Social  History  . Marital Status: Legally Separated    Spouse Name: N/A    Number of Children: N/A  . Years of Education: N/A   Occupational History  . not working    Social History Main Topics  . Smoking status: Former Smoker    Quit date: 09/09/1958  . Smokeless tobacco: Never Used  . Alcohol Use: No  . Drug Use: No  . Sexual Activity: None   Other Topics Concern  . None   Social History Narrative   Lives with daughter.     Objective:  Physical Exam: Filed Vitals:   03/29/13 1110  BP: 115/71  Pulse: 76  Temp: 97.6 F (36.4 C)  TempSrc: Oral  Height: 5' 1.75" (1.568 m)  Weight: 237 lb 9.6 oz (107.775 kg)  SpO2: 98%   HEENT: PERRL, EOMI, no scleral icterus Cardiac: RRR, no rubs, murmurs or gallops Pulm: clear to auscultation bilaterally, moving normal volumes of air Abd: soft, nontender, nondistended, BS present Ext: warm and well perfused, no pedal edema Neuro: alert and oriented X3, cranial nerves II-XII grossly intact  Assessment & Plan:  Case and care discussed with Dr. Lynnae January.  Please see problem oriented charting for further details.

## 2013-03-29 NOTE — Assessment & Plan Note (Signed)
Patient has been followed by Dr. Zigmund Daniel for at least a year, and is s/p laser treatment. She needs a referral placed from PCP for insurance.  -Referral placed.

## 2013-03-29 NOTE — Patient Instructions (Signed)
-  We will get those two referrals in for your!  Please be sure to bring all of your medications with you to every visit.  Should you have any new or worsening symptoms, please be sure to call the clinic at [phone].

## 2013-03-29 NOTE — Assessment & Plan Note (Signed)
Patient reports she has already been referred by Dr. Zigmund Daniel to a corneal surgeon for removal of this pterygium - needs referral by PCP for insurance.  -Referral placed.

## 2013-03-29 NOTE — Progress Notes (Signed)
Case discussed with Dr. Sharda soon after the resident saw the patient.  We reviewed the resident's history and exam and pertinent patient test results.  I agree with the assessment, diagnosis, and plan of care documented in the resident's note. 

## 2013-04-01 ENCOUNTER — Encounter (INDEPENDENT_AMBULATORY_CARE_PROVIDER_SITE_OTHER): Payer: Medicare HMO | Admitting: Ophthalmology

## 2013-04-05 ENCOUNTER — Encounter (INDEPENDENT_AMBULATORY_CARE_PROVIDER_SITE_OTHER): Payer: Commercial Managed Care - HMO | Admitting: Ophthalmology

## 2013-04-05 DIAGNOSIS — H35039 Hypertensive retinopathy, unspecified eye: Secondary | ICD-10-CM

## 2013-04-05 DIAGNOSIS — H348392 Tributary (branch) retinal vein occlusion, unspecified eye, stable: Secondary | ICD-10-CM | POA: Diagnosis not present

## 2013-04-05 DIAGNOSIS — I10 Essential (primary) hypertension: Secondary | ICD-10-CM

## 2013-04-05 DIAGNOSIS — H2510 Age-related nuclear cataract, unspecified eye: Secondary | ICD-10-CM

## 2013-04-05 DIAGNOSIS — H43819 Vitreous degeneration, unspecified eye: Secondary | ICD-10-CM

## 2013-04-11 ENCOUNTER — Encounter: Payer: Medicare Other | Admitting: Internal Medicine

## 2013-05-03 ENCOUNTER — Encounter (INDEPENDENT_AMBULATORY_CARE_PROVIDER_SITE_OTHER): Payer: Medicare HMO | Admitting: Ophthalmology

## 2013-05-08 ENCOUNTER — Other Ambulatory Visit: Payer: Self-pay | Admitting: *Deleted

## 2013-05-08 MED ORDER — PANTOPRAZOLE SODIUM 40 MG PO TBEC: DELAYED_RELEASE_TABLET | ORAL | Status: DC

## 2013-05-10 ENCOUNTER — Encounter (INDEPENDENT_AMBULATORY_CARE_PROVIDER_SITE_OTHER): Payer: Medicare HMO | Admitting: Ophthalmology

## 2013-05-13 ENCOUNTER — Other Ambulatory Visit: Payer: Self-pay | Admitting: *Deleted

## 2013-05-13 MED ORDER — POTASSIUM CHLORIDE CRYS ER 20 MEQ PO TBCR: EXTENDED_RELEASE_TABLET | ORAL | Status: DC

## 2013-05-14 ENCOUNTER — Other Ambulatory Visit: Payer: Self-pay | Admitting: Internal Medicine

## 2013-05-16 ENCOUNTER — Encounter (INDEPENDENT_AMBULATORY_CARE_PROVIDER_SITE_OTHER): Payer: Medicare HMO | Admitting: Ophthalmology

## 2013-05-16 DIAGNOSIS — H348392 Tributary (branch) retinal vein occlusion, unspecified eye, stable: Secondary | ICD-10-CM

## 2013-05-16 DIAGNOSIS — H43819 Vitreous degeneration, unspecified eye: Secondary | ICD-10-CM

## 2013-05-16 DIAGNOSIS — I10 Essential (primary) hypertension: Secondary | ICD-10-CM

## 2013-05-16 DIAGNOSIS — H35039 Hypertensive retinopathy, unspecified eye: Secondary | ICD-10-CM

## 2013-05-24 ENCOUNTER — Other Ambulatory Visit: Payer: Self-pay | Admitting: Internal Medicine

## 2013-05-24 ENCOUNTER — Other Ambulatory Visit: Payer: Self-pay | Admitting: *Deleted

## 2013-05-24 DIAGNOSIS — M25511 Pain in right shoulder: Secondary | ICD-10-CM

## 2013-05-24 MED ORDER — ACETAMINOPHEN-CODEINE 300-30 MG PO TABS: 1.0000 | ORAL_TABLET | Freq: Two times a day (BID) | ORAL | Status: DC | PRN

## 2013-05-24 NOTE — Telephone Encounter (Signed)
Rx called in 

## 2013-06-03 ENCOUNTER — Other Ambulatory Visit: Payer: Self-pay | Admitting: *Deleted

## 2013-06-03 MED ORDER — LISINOPRIL-HYDROCHLOROTHIAZIDE 20-25 MG PO TABS: 1.0000 | ORAL_TABLET | Freq: Every day | ORAL | Status: DC

## 2013-06-06 ENCOUNTER — Encounter: Payer: Medicare Other | Admitting: Internal Medicine

## 2013-06-07 ENCOUNTER — Encounter (INDEPENDENT_AMBULATORY_CARE_PROVIDER_SITE_OTHER): Payer: Medicare HMO | Admitting: Ophthalmology

## 2013-06-07 DIAGNOSIS — H348392 Tributary (branch) retinal vein occlusion, unspecified eye, stable: Secondary | ICD-10-CM

## 2013-06-07 DIAGNOSIS — H35039 Hypertensive retinopathy, unspecified eye: Secondary | ICD-10-CM

## 2013-06-07 DIAGNOSIS — I10 Essential (primary) hypertension: Secondary | ICD-10-CM

## 2013-06-07 DIAGNOSIS — H43819 Vitreous degeneration, unspecified eye: Secondary | ICD-10-CM

## 2013-06-07 DIAGNOSIS — H2510 Age-related nuclear cataract, unspecified eye: Secondary | ICD-10-CM

## 2013-07-05 ENCOUNTER — Encounter (INDEPENDENT_AMBULATORY_CARE_PROVIDER_SITE_OTHER): Payer: Medicare HMO | Admitting: Ophthalmology

## 2013-07-05 DIAGNOSIS — I10 Essential (primary) hypertension: Secondary | ICD-10-CM

## 2013-07-05 DIAGNOSIS — H43819 Vitreous degeneration, unspecified eye: Secondary | ICD-10-CM

## 2013-07-05 DIAGNOSIS — H35039 Hypertensive retinopathy, unspecified eye: Secondary | ICD-10-CM

## 2013-07-05 DIAGNOSIS — H348392 Tributary (branch) retinal vein occlusion, unspecified eye, stable: Secondary | ICD-10-CM

## 2013-07-05 DIAGNOSIS — H2510 Age-related nuclear cataract, unspecified eye: Secondary | ICD-10-CM

## 2013-07-10 ENCOUNTER — Other Ambulatory Visit: Payer: Self-pay | Admitting: *Deleted

## 2013-07-10 MED ORDER — AMLODIPINE BESYLATE 5 MG PO TABS: ORAL_TABLET | ORAL | Status: DC

## 2013-07-18 ENCOUNTER — Other Ambulatory Visit: Payer: Self-pay | Admitting: *Deleted

## 2013-07-18 NOTE — Telephone Encounter (Signed)
Pt must change to [employer] due to insurance also must have 90 day supplies

## 2013-07-19 MED ORDER — PANTOPRAZOLE SODIUM 40 MG PO TBEC: DELAYED_RELEASE_TABLET | ORAL | Status: DC

## 2013-07-19 MED ORDER — ATORVASTATIN CALCIUM 40 MG PO TABS: 40.0000 mg | ORAL_TABLET | Freq: Every day | ORAL | Status: DC

## 2013-07-19 MED ORDER — LISINOPRIL-HYDROCHLOROTHIAZIDE 20-25 MG PO TABS: 1.0000 | ORAL_TABLET | Freq: Every day | ORAL | Status: DC

## 2013-07-19 MED ORDER — POTASSIUM CHLORIDE CRYS ER 20 MEQ PO TBCR: EXTENDED_RELEASE_TABLET | ORAL | Status: DC

## 2013-07-19 MED ORDER — CARVEDILOL 6.25 MG PO TABS: 6.2500 mg | ORAL_TABLET | Freq: Two times a day (BID) | ORAL | Status: DC

## 2013-07-19 MED ORDER — AMLODIPINE BESYLATE 5 MG PO TABS: ORAL_TABLET | ORAL | Status: DC

## 2013-08-02 ENCOUNTER — Encounter (INDEPENDENT_AMBULATORY_CARE_PROVIDER_SITE_OTHER): Payer: Commercial Managed Care - HMO | Admitting: Ophthalmology

## 2013-08-02 DIAGNOSIS — H2510 Age-related nuclear cataract, unspecified eye: Secondary | ICD-10-CM | POA: Diagnosis not present

## 2013-08-02 DIAGNOSIS — H35039 Hypertensive retinopathy, unspecified eye: Secondary | ICD-10-CM | POA: Diagnosis not present

## 2013-08-02 DIAGNOSIS — H43819 Vitreous degeneration, unspecified eye: Secondary | ICD-10-CM | POA: Diagnosis not present

## 2013-08-02 DIAGNOSIS — H348392 Tributary (branch) retinal vein occlusion, unspecified eye, stable: Secondary | ICD-10-CM | POA: Diagnosis not present

## 2013-08-02 DIAGNOSIS — I10 Essential (primary) hypertension: Secondary | ICD-10-CM

## 2013-08-30 ENCOUNTER — Encounter (INDEPENDENT_AMBULATORY_CARE_PROVIDER_SITE_OTHER): Payer: Commercial Managed Care - HMO | Admitting: Ophthalmology

## 2013-09-02 ENCOUNTER — Other Ambulatory Visit: Payer: Self-pay | Admitting: *Deleted

## 2013-09-02 DIAGNOSIS — M25511 Pain in right shoulder: Secondary | ICD-10-CM

## 2013-09-02 MED ORDER — ACETAMINOPHEN-CODEINE 300-30 MG PO TABS: 1.0000 | ORAL_TABLET | Freq: Two times a day (BID) | ORAL | Status: DC | PRN

## 2013-09-02 NOTE — Telephone Encounter (Signed)
Pt # [phone]

## 2013-09-04 ENCOUNTER — Encounter (INDEPENDENT_AMBULATORY_CARE_PROVIDER_SITE_OTHER): Payer: Commercial Managed Care - HMO | Admitting: Ophthalmology

## 2013-09-04 DIAGNOSIS — H348392 Tributary (branch) retinal vein occlusion, unspecified eye, stable: Secondary | ICD-10-CM | POA: Diagnosis not present

## 2013-09-04 DIAGNOSIS — H35039 Hypertensive retinopathy, unspecified eye: Secondary | ICD-10-CM

## 2013-09-04 DIAGNOSIS — H43819 Vitreous degeneration, unspecified eye: Secondary | ICD-10-CM | POA: Diagnosis not present

## 2013-09-04 DIAGNOSIS — I10 Essential (primary) hypertension: Secondary | ICD-10-CM

## 2013-09-04 DIAGNOSIS — H2510 Age-related nuclear cataract, unspecified eye: Secondary | ICD-10-CM

## 2013-09-19 ENCOUNTER — Encounter: Payer: Self-pay | Admitting: Internal Medicine

## 2013-09-19 ENCOUNTER — Ambulatory Visit (INDEPENDENT_AMBULATORY_CARE_PROVIDER_SITE_OTHER): Payer: Medicare HMO | Admitting: Internal Medicine

## 2013-09-19 VITALS — BP 137/78 | HR 74 | Temp 97.6°F | Ht 61.0 in | Wt 242.6 lb

## 2013-09-19 DIAGNOSIS — R7309 Other abnormal glucose: Secondary | ICD-10-CM

## 2013-09-19 DIAGNOSIS — M545 Low back pain, unspecified: Secondary | ICD-10-CM

## 2013-09-19 DIAGNOSIS — E785 Hyperlipidemia, unspecified: Secondary | ICD-10-CM

## 2013-09-19 DIAGNOSIS — R7303 Prediabetes: Secondary | ICD-10-CM

## 2013-09-19 DIAGNOSIS — I10 Essential (primary) hypertension: Secondary | ICD-10-CM

## 2013-09-19 LAB — HEMOGLOBIN A1C
Hgb A1c MFr Bld: 6.3 % — ABNORMAL HIGH (ref <5.7)
Mean Plasma Glucose: 134 mg/dL — ABNORMAL HIGH (ref <117)

## 2013-09-19 LAB — BASIC METABOLIC PANEL WITH GFR
BUN: 17 mg/dL (ref 6–23)
CHLORIDE: 102 meq/L (ref 96–112)
CO2: 25 meq/L (ref 19–32)
Calcium: 9 mg/dL (ref 8.4–10.5)
Creat: 0.93 mg/dL (ref 0.50–1.10)
GFR, EST NON AFRICAN AMERICAN: 60 mL/min
GFR, Est African American: 70 mL/min
Glucose, Bld: 113 mg/dL — ABNORMAL HIGH (ref 70–99)
POTASSIUM: 3.8 meq/L (ref 3.5–5.3)
Sodium: 137 mEq/L (ref 135–145)

## 2013-09-19 LAB — LIPID PANEL
CHOL/HDL RATIO: 2.4 ratio
CHOLESTEROL: 127 mg/dL (ref 0–200)
HDL: 53 mg/dL (ref >39)
LDL Cholesterol: 61 mg/dL (ref 0–99)
TRIGLYCERIDES: 64 mg/dL (ref <150)
VLDL: 13 mg/dL (ref 0–40)

## 2013-09-19 MED ORDER — ACETAMINOPHEN-CODEINE 300-30 MG PO TABS: 1.0000 | ORAL_TABLET | Freq: Three times a day (TID) | ORAL | Status: DC | PRN

## 2013-09-19 NOTE — Assessment & Plan Note (Signed)
-   Will continue with lipitor for now - Will check lipid panel today

## 2013-09-19 NOTE — Patient Instructions (Addendum)
Your blood pressure is at goal. Keep up the good work! I am changing your tylenol 3 to 3 times a day as needed I will also get some lab work on you today and will follow up on the results Keep up the good work !  Back Pain, Adult Low back pain is very common. About 1 in 5 people have back pain.The cause of low back pain is rarely dangerous. The pain often gets better over time.About half of people with a sudden onset of back pain feel better in just 2 weeks. About 8 in 10 people feel better by 6 weeks.  CAUSES Some common causes of back pain include:  Strain of the muscles or ligaments supporting the spine.  Wear and tear (degeneration) of the spinal discs.  Arthritis.  Direct injury to the back. DIAGNOSIS Most of the time, the direct cause of low back pain is not known.However, back pain can be treated effectively even when the exact cause of the pain is unknown.Answering your caregiver's questions about your overall health and symptoms is one of the most accurate ways to make sure the cause of your pain is not dangerous. If your caregiver needs more information, he or she may order lab work or imaging tests (X-rays or MRIs).However, even if imaging tests show changes in your back, this usually does not require surgery. HOME CARE INSTRUCTIONS For many people, back pain returns.Since low back pain is rarely dangerous, it is often a condition that people can learn to [facility] their own.   Remain active. It is stressful on the back to sit or stand in one place. Do not sit, drive, or stand in one place for more than 30 minutes at a time. Take short walks on level surfaces as soon as pain allows.Try to increase the length of time you walk each day.  Do not stay in bed.Resting more than 1 or 2 days can delay your recovery.  Do not avoid exercise or work.Your body is made to move.It is not dangerous to be active, even though your back may hurt.Your back will likely heal faster if you  return to being active before your pain is gone.  Pay attention to your body when you bend and lift. Many people have less discomfortwhen lifting if they bend their knees, keep the load close to their bodies,and avoid twisting. Often, the most comfortable positions are those that put less stress on your recovering back.  Find a comfortable position to sleep. Use a firm mattress and lie on your side with your knees slightly bent. If you lie on your back, put a pillow under your knees.  Only take over-the-counter or prescription medicines as directed by your caregiver. Over-the-counter medicines to reduce pain and inflammation are often the most helpful.Your caregiver may prescribe muscle relaxant drugs.These medicines help dull your pain so you can more quickly return to your normal activities and healthy exercise.  Put ice on the injured area.  Put ice in a plastic bag.  Place a towel between your skin and the bag.  Leave the ice on for 15-20 minutes, 03-04 times a day for the first 2 to 3 days. After that, ice and heat may be alternated to reduce pain and spasms.  Ask your caregiver about trying back exercises and gentle massage. This may be of some benefit.  Avoid feeling anxious or stressed.Stress increases muscle tension and can worsen back pain.It is important to recognize when you are anxious or stressed and learn  ways to manage it.Exercise is a great option. SEEK MEDICAL CARE IF:  You have pain that is not relieved with rest or medicine.  You have pain that does not improve in 1 week.  You have new symptoms.  You are generally not feeling well. SEEK IMMEDIATE MEDICAL CARE IF:   You have pain that radiates from your back into your legs.  You develop new bowel or bladder control problems.  You have unusual weakness or numbness in your arms or legs.  You develop nausea or vomiting.  You develop abdominal pain.  You feel faint. Document Released: 02/21/2005  Document Revised: 08/23/2011 Document Reviewed: 07/12/2010 [facility] Patient Information 2015 [place], [place]. This information is not intended to replace advice given to you by your health care provider. Make sure you discuss any questions you have with your health care provider.

## 2013-09-19 NOTE — Progress Notes (Signed)
   Subjective:    Patient ID: [name], female    DOB: December 05, 1935, 78 y.o.   MRN: [id number]  Sore Throat  Associated symptoms include trouble swallowing. Pertinent negatives include no ear discharge or ear pain.   78 year old female with PMH of HTN, GERD, HLD, chronic lower back pain presents for routine follow up. Patient states she had a recent sore throat last week associated with odynophagia but has now resolved. She also complains of persistent lower back pain which is chronic but she has to now take the pain medication 3 times a day. No associated weakness, numbness or bowel and bladder incontinence. Patient was also wondering if it was possible to cut back on some of her medications   Review of Systems  Constitutional: Negative.   HENT: Positive for sore throat and trouble swallowing. Negative for ear discharge, ear pain, nosebleeds, postnasal drip, tinnitus and voice change.   Eyes: Negative.   Respiratory: Negative.   Cardiovascular: Negative.   Gastrointestinal: Negative.   Endocrine: Negative.   Genitourinary: Negative.   Musculoskeletal: Positive for back pain.  Neurological: Negative.   Psychiatric/Behavioral: Negative.        Objective:   Physical Exam  Constitutional: She is oriented to person, place, and time. She appears well-developed and well-nourished.  HENT:  Head: Normocephalic and atraumatic.  Mouth/Throat: No oropharyngeal exudate.  Neck: Normal range of motion. Neck supple.  Cardiovascular: Normal rate, regular rhythm and normal heart sounds.   Pulmonary/Chest: Effort normal and breath sounds normal. She has no wheezes. She has no rales.  Abdominal: Soft. Bowel sounds are normal. There is no tenderness.  Musculoskeletal: Normal range of motion. She exhibits no edema and no tenderness.  Neurological: She is alert and oriented to person, place, and time.  Psychiatric: She has a normal mood and affect. Her behavior is normal.          Assessment &  Plan:  - Patient with recent sore throat, odynophagia but now resolved. No pharyngeal erythema or exudates on exam No treatment for now. Will monitor  - I also explained to patient that I  would continue with her current medications for now and explained the reasoning for her medications  Please see problem based charting for further assessment and plans

## 2013-09-19 NOTE — Assessment & Plan Note (Signed)
-   Increased her tylenol#3 to 3 times a day as needed as patient states pain control does not last for 12 hours - No red flag signs or symptoms but if worsening back pain will consider re imaging lumbar spine

## 2013-09-19 NOTE — Assessment & Plan Note (Signed)
Patient with last A1C of 6.1 and with likely borderline DM Will recheck A1C today.

## 2013-09-19 NOTE — Assessment & Plan Note (Signed)
BP Readings from Last 3 Encounters:  09/19/13 137/78  03/29/13 115/71  02/15/13 125/75    Lab Results  Component Value Date   NA 137 04/26/2012   K 4.1 04/26/2012   CREATININE 0.59 04/26/2012    Assessment: Blood pressure control:  well controlled Progress toward BP goal:   at goal Comments: will continue with current medications for now  Plan: Medications:  continue current medications Educational resources provided: handout Self management tools provided:   Other plans: will check BMP given patient on lisinopril/HCTZ and potassium supplementation. Needs follow up of her creatinine and electrolytes

## 2013-09-27 ENCOUNTER — Encounter (INDEPENDENT_AMBULATORY_CARE_PROVIDER_SITE_OTHER): Payer: Commercial Managed Care - HMO | Admitting: Ophthalmology

## 2013-10-10 ENCOUNTER — Encounter (INDEPENDENT_AMBULATORY_CARE_PROVIDER_SITE_OTHER): Payer: Commercial Managed Care - HMO | Admitting: Ophthalmology

## 2013-10-23 ENCOUNTER — Encounter (INDEPENDENT_AMBULATORY_CARE_PROVIDER_SITE_OTHER): Payer: Commercial Managed Care - HMO | Admitting: Ophthalmology

## 2013-10-23 DIAGNOSIS — H43819 Vitreous degeneration, unspecified eye: Secondary | ICD-10-CM | POA: Diagnosis not present

## 2013-10-23 DIAGNOSIS — I10 Essential (primary) hypertension: Secondary | ICD-10-CM | POA: Diagnosis not present

## 2013-10-23 DIAGNOSIS — H348392 Tributary (branch) retinal vein occlusion, unspecified eye, stable: Secondary | ICD-10-CM | POA: Diagnosis not present

## 2013-10-23 DIAGNOSIS — H35039 Hypertensive retinopathy, unspecified eye: Secondary | ICD-10-CM

## 2013-10-25 ENCOUNTER — Encounter (HOSPITAL_COMMUNITY): Payer: Self-pay | Admitting: Emergency Medicine

## 2013-10-25 ENCOUNTER — Emergency Department (HOSPITAL_COMMUNITY): Payer: Medicare HMO

## 2013-10-25 ENCOUNTER — Inpatient Hospital Stay (HOSPITAL_COMMUNITY)
Admission: EM | Admit: 2013-10-25 | Discharge: 2013-11-04 | DRG: 353 | Disposition: A | Discharge status: Home Health | Payer: Medicare HMO | Attending: General Surgery | Admitting: General Surgery

## 2013-10-25 DIAGNOSIS — Z8249 Family history of ischemic heart disease and other diseases of the circulatory system: Secondary | ICD-10-CM

## 2013-10-25 DIAGNOSIS — N170 Acute kidney failure with tubular necrosis: Secondary | ICD-10-CM | POA: Diagnosis not present

## 2013-10-25 DIAGNOSIS — Z79899 Other long term (current) drug therapy: Secondary | ICD-10-CM | POA: Diagnosis not present

## 2013-10-25 DIAGNOSIS — R109 Unspecified abdominal pain: Secondary | ICD-10-CM | POA: Diagnosis present

## 2013-10-25 DIAGNOSIS — J45909 Unspecified asthma, uncomplicated: Secondary | ICD-10-CM | POA: Diagnosis present

## 2013-10-25 DIAGNOSIS — Z6841 Body Mass Index (BMI) 40.0 and over, adult: Secondary | ICD-10-CM | POA: Diagnosis not present

## 2013-10-25 DIAGNOSIS — R5381 Other malaise: Secondary | ICD-10-CM | POA: Diagnosis not present

## 2013-10-25 DIAGNOSIS — Z853 Personal history of malignant neoplasm of breast: Secondary | ICD-10-CM

## 2013-10-25 DIAGNOSIS — M1710 Unilateral primary osteoarthritis, unspecified knee: Secondary | ICD-10-CM | POA: Diagnosis present

## 2013-10-25 DIAGNOSIS — I129 Hypertensive chronic kidney disease with stage 1 through stage 4 chronic kidney disease, or unspecified chronic kidney disease: Secondary | ICD-10-CM | POA: Diagnosis present

## 2013-10-25 DIAGNOSIS — R197 Diarrhea, unspecified: Secondary | ICD-10-CM | POA: Diagnosis not present

## 2013-10-25 DIAGNOSIS — M545 Low back pain, unspecified: Secondary | ICD-10-CM | POA: Diagnosis present

## 2013-10-25 DIAGNOSIS — Z87891 Personal history of nicotine dependence: Secondary | ICD-10-CM | POA: Diagnosis not present

## 2013-10-25 DIAGNOSIS — K56609 Unspecified intestinal obstruction, unspecified as to partial versus complete obstruction: Secondary | ICD-10-CM | POA: Diagnosis present

## 2013-10-25 DIAGNOSIS — E785 Hyperlipidemia, unspecified: Secondary | ICD-10-CM | POA: Diagnosis present

## 2013-10-25 DIAGNOSIS — K219 Gastro-esophageal reflux disease without esophagitis: Secondary | ICD-10-CM | POA: Diagnosis present

## 2013-10-25 DIAGNOSIS — I10 Essential (primary) hypertension: Secondary | ICD-10-CM

## 2013-10-25 DIAGNOSIS — E876 Hypokalemia: Secondary | ICD-10-CM

## 2013-10-25 DIAGNOSIS — E039 Hypothyroidism, unspecified: Secondary | ICD-10-CM | POA: Diagnosis present

## 2013-10-25 DIAGNOSIS — J9819 Other pulmonary collapse: Secondary | ICD-10-CM | POA: Diagnosis not present

## 2013-10-25 DIAGNOSIS — Z833 Family history of diabetes mellitus: Secondary | ICD-10-CM | POA: Diagnosis not present

## 2013-10-25 DIAGNOSIS — J9600 Acute respiratory failure, unspecified whether with hypoxia or hypercapnia: Secondary | ICD-10-CM | POA: Diagnosis not present

## 2013-10-25 DIAGNOSIS — G8929 Other chronic pain: Secondary | ICD-10-CM | POA: Diagnosis present

## 2013-10-25 DIAGNOSIS — Z820 Family history of epilepsy and other diseases of the nervous system: Secondary | ICD-10-CM | POA: Diagnosis not present

## 2013-10-25 DIAGNOSIS — Z96649 Presence of unspecified artificial hip joint: Secondary | ICD-10-CM

## 2013-10-25 DIAGNOSIS — E871 Hypo-osmolality and hyponatremia: Secondary | ICD-10-CM | POA: Diagnosis not present

## 2013-10-25 DIAGNOSIS — K430 Incisional hernia with obstruction, without gangrene: Principal | ICD-10-CM | POA: Diagnosis present

## 2013-10-25 DIAGNOSIS — D72829 Elevated white blood cell count, unspecified: Secondary | ICD-10-CM | POA: Diagnosis present

## 2013-10-25 DIAGNOSIS — K460 Unspecified abdominal hernia with obstruction, without gangrene: Secondary | ICD-10-CM

## 2013-10-25 DIAGNOSIS — N182 Chronic kidney disease, stage 2 (mild): Secondary | ICD-10-CM | POA: Diagnosis present

## 2013-10-25 DIAGNOSIS — K436 Other and unspecified ventral hernia with obstruction, without gangrene: Secondary | ICD-10-CM

## 2013-10-25 DIAGNOSIS — I872 Venous insufficiency (chronic) (peripheral): Secondary | ICD-10-CM | POA: Diagnosis present

## 2013-10-25 HISTORY — DX: Unspecified asthma, uncomplicated: J45.909

## 2013-10-25 HISTORY — DX: Other chronic pain: G89.29

## 2013-10-25 HISTORY — DX: Dorsalgia, unspecified: M54.9

## 2013-10-25 HISTORY — DX: Malignant (primary) neoplasm, unspecified: C80.1

## 2013-10-25 LAB — COMPREHENSIVE METABOLIC PANEL
ALK PHOS: 61 U/L (ref 39–117)
ALT: 7 U/L (ref 0–35)
AST: 22 U/L (ref 0–37)
Albumin: 4 g/dL (ref 3.5–5.2)
Anion gap: 19 — ABNORMAL HIGH (ref 5–15)
BILIRUBIN TOTAL: 0.9 mg/dL (ref 0.3–1.2)
BUN: 18 mg/dL (ref 6–23)
CALCIUM: 10.1 mg/dL (ref 8.4–10.5)
CHLORIDE: 95 meq/L — AB (ref 96–112)
CO2: 25 mEq/L (ref 19–32)
Creatinine, Ser: 0.95 mg/dL (ref 0.50–1.10)
GFR, EST AFRICAN AMERICAN: 66 mL/min — AB (ref >90)
GFR, EST NON AFRICAN AMERICAN: 57 mL/min — AB (ref >90)
GLUCOSE: 156 mg/dL — AB (ref 70–99)
Potassium: 3.6 mEq/L — ABNORMAL LOW (ref 3.7–5.3)
SODIUM: 139 meq/L (ref 137–147)
Total Protein: 8.6 g/dL — ABNORMAL HIGH (ref 6.0–8.3)

## 2013-10-25 LAB — LIPASE, BLOOD: Lipase: 31 U/L (ref 11–59)

## 2013-10-25 LAB — URINE MICROSCOPIC-ADD ON

## 2013-10-25 LAB — CBC WITH DIFFERENTIAL/PLATELET
Basophils Absolute: 0 10*3/uL (ref 0.0–0.1)
Basophils Relative: 0 % (ref 0–1)
EOS PCT: 0 % (ref 0–5)
Eosinophils Absolute: 0 10*3/uL (ref 0.0–0.7)
HEMATOCRIT: 43.1 % (ref 36.0–46.0)
Hemoglobin: 14.1 g/dL (ref 12.0–15.0)
LYMPHS ABS: 1.1 10*3/uL (ref 0.7–4.0)
Lymphocytes Relative: 7 % — ABNORMAL LOW (ref 12–46)
MCH: 26.8 pg (ref 26.0–34.0)
MCHC: 32.7 g/dL (ref 30.0–36.0)
MCV: 81.9 fL (ref 78.0–100.0)
Monocytes Absolute: 0.7 10*3/uL (ref 0.1–1.0)
Monocytes Relative: 4 % (ref 3–12)
NEUTROS ABS: 13.5 10*3/uL — AB (ref 1.7–7.7)
Neutrophils Relative %: 89 % — ABNORMAL HIGH (ref 43–77)
Platelets: 278 10*3/uL (ref 150–400)
RBC: 5.26 MIL/uL — ABNORMAL HIGH (ref 3.87–5.11)
RDW: 13.9 % (ref 11.5–15.5)
WBC: 15.2 10*3/uL — AB (ref 4.0–10.5)

## 2013-10-25 LAB — TROPONIN I: Troponin I: <0.3 ng/mL (ref <0.30)

## 2013-10-25 LAB — URINALYSIS, ROUTINE W REFLEX MICROSCOPIC
GLUCOSE, UA: NEGATIVE mg/dL
Ketones, ur: 15 mg/dL — AB
LEUKOCYTES UA: NEGATIVE
Nitrite: NEGATIVE
Protein, ur: 100 mg/dL — AB
SPECIFIC GRAVITY, URINE: 1.029 (ref 1.005–1.030)
Urobilinogen, UA: 0.2 mg/dL (ref 0.0–1.0)
pH: 5.5 (ref 5.0–8.0)

## 2013-10-25 LAB — LACTIC ACID, PLASMA: LACTIC ACID, VENOUS: 1.7 mmol/L (ref 0.5–2.2)

## 2013-10-25 MED ORDER — HYDRALAZINE HCL 20 MG/ML IJ SOLN: 10.0000 mg | Freq: Four times a day (QID) | INTRAMUSCULAR | Status: DC | PRN

## 2013-10-25 MED ORDER — IOHEXOL 300 MG/ML  SOLN
100.0000 mL | Freq: Once | INTRAMUSCULAR | Status: AC | PRN
  Administered 2013-10-25: 100 mL via INTRAVENOUS

## 2013-10-25 MED ORDER — ENOXAPARIN SODIUM 40 MG/0.4ML ~~LOC~~ SOLN
40.0000 mg | SUBCUTANEOUS | Status: DC
  Administered 2013-10-25 – 2013-10-28 (×4): 40 mg via SUBCUTANEOUS
  Filled 2013-10-25 (×5): qty 0.4

## 2013-10-25 MED ORDER — CETYLPYRIDINIUM CHLORIDE 0.05 % MT LIQD
7.0000 mL | Freq: Two times a day (BID) | OROMUCOSAL | Status: DC
  Administered 2013-10-25 – 2013-11-04 (×17): 7 mL via OROMUCOSAL

## 2013-10-25 MED ORDER — CARVEDILOL 6.25 MG PO TABS
6.2500 mg | ORAL_TABLET | Freq: Two times a day (BID) | ORAL | Status: DC
  Administered 2013-10-25 – 2013-11-04 (×19): 6.25 mg via ORAL
  Filled 2013-10-25 (×24): qty 1

## 2013-10-25 MED ORDER — SODIUM CHLORIDE 0.9 % IV SOLN
INTRAVENOUS | Status: DC
  Administered 2013-10-25: 12:00:00 via INTRAVENOUS

## 2013-10-25 MED ORDER — ONDANSETRON HCL 4 MG/2ML IJ SOLN: 4.0000 mg | Freq: Four times a day (QID) | INTRAMUSCULAR | Status: DC | PRN

## 2013-10-25 MED ORDER — FAMOTIDINE IN NACL 20-0.9 MG/50ML-% IV SOLN
20.0000 mg | Freq: Once | INTRAVENOUS | Status: AC
  Administered 2013-10-25: 20 mg via INTRAVENOUS
  Filled 2013-10-25: qty 50

## 2013-10-25 MED ORDER — IOHEXOL 300 MG/ML  SOLN
25.0000 mL | Freq: Once | INTRAMUSCULAR | Status: AC | PRN
  Administered 2013-10-25: 25 mL via ORAL

## 2013-10-25 MED ORDER — KCL IN DEXTROSE-NACL 20-5-0.9 MEQ/L-%-% IV SOLN
INTRAVENOUS | Status: DC
  Administered 2013-10-25 – 2013-10-30 (×12): via INTRAVENOUS
  Administered 2013-10-31: 75 mL/h via INTRAVENOUS
  Filled 2013-10-25 (×14): qty 1000

## 2013-10-25 MED ORDER — LISINOPRIL-HYDROCHLOROTHIAZIDE 20-25 MG PO TABS: 1.0000 | ORAL_TABLET | Freq: Every day | ORAL | Status: DC

## 2013-10-25 MED ORDER — ACETAMINOPHEN 650 MG RE SUPP: 650.0000 mg | Freq: Four times a day (QID) | RECTAL | Status: DC | PRN

## 2013-10-25 MED ORDER — ACETAMINOPHEN 325 MG PO TABS
650.0000 mg | ORAL_TABLET | Freq: Four times a day (QID) | ORAL | Status: DC | PRN
Start: 2013-10-25 — End: 2013-11-04

## 2013-10-25 MED ORDER — ALBUTEROL SULFATE (2.5 MG/3ML) 0.083% IN NEBU
2.5000 mg | INHALATION_SOLUTION | Freq: Four times a day (QID) | RESPIRATORY_TRACT | Status: DC | PRN
  Administered 2013-10-26 – 2013-10-27 (×4): 2.5 mg via RESPIRATORY_TRACT
  Filled 2013-10-25 (×4): qty 3

## 2013-10-25 MED ORDER — ONDANSETRON HCL 4 MG/2ML IJ SOLN
4.0000 mg | INTRAMUSCULAR | Status: DC | PRN
  Administered 2013-10-25: 4 mg via INTRAVENOUS
  Filled 2013-10-25: qty 2

## 2013-10-25 MED ORDER — HYDROCHLOROTHIAZIDE 25 MG PO TABS
25.0000 mg | ORAL_TABLET | Freq: Every day | ORAL | Status: DC
  Administered 2013-10-26 – 2013-10-30 (×5): 25 mg via ORAL
  Filled 2013-10-25 (×7): qty 1

## 2013-10-25 MED ORDER — AMLODIPINE BESYLATE 5 MG PO TABS
5.0000 mg | ORAL_TABLET | Freq: Every day | ORAL | Status: DC
  Administered 2013-10-28 – 2013-11-01 (×4): 5 mg via ORAL
  Filled 2013-10-25 (×8): qty 1

## 2013-10-25 MED ORDER — MORPHINE SULFATE 2 MG/ML IJ SOLN
2.0000 mg | INTRAMUSCULAR | Status: DC | PRN
  Administered 2013-10-25 – 2013-10-26 (×4): 2 mg via INTRAVENOUS
  Administered 2013-10-26: 1 mg via INTRAVENOUS
  Administered 2013-10-26: 2 mg via INTRAVENOUS
  Administered 2013-10-26: 1 mg via INTRAVENOUS
  Administered 2013-10-26 – 2013-10-29 (×18): 2 mg via INTRAVENOUS
  Filled 2013-10-25 (×25): qty 1

## 2013-10-25 MED ORDER — LISINOPRIL 20 MG PO TABS
20.0000 mg | ORAL_TABLET | Freq: Every day | ORAL | Status: DC
  Administered 2013-10-28 – 2013-10-29 (×2): 20 mg via ORAL
  Filled 2013-10-25 (×7): qty 1

## 2013-10-25 MED ORDER — PANTOPRAZOLE SODIUM 40 MG IV SOLR
40.0000 mg | Freq: Every day | INTRAVENOUS | Status: DC
  Administered 2013-10-25 – 2013-10-30 (×6): 40 mg via INTRAVENOUS
  Filled 2013-10-25 (×9): qty 40

## 2013-10-25 MED ORDER — ALBUTEROL SULFATE HFA 108 (90 BASE) MCG/ACT IN AERS: 2.0000 | INHALATION_SPRAY | Freq: Four times a day (QID) | RESPIRATORY_TRACT | Status: DC | PRN

## 2013-10-25 NOTE — H&P (Signed)
Chief Complaint: abdominal pain  HPI: [name] is a 78 year old female with a history of HTN, obesity, hypothyroidism, breast cancer, s/p lumpectomy, abdominal hysterectomy, hernia repair, GERD and asthma.  She presents to [facility] with abdominal pain.  Duration of symptoms is 2 days. Onset was Wednesday afternoon after eating lunch.  Time pattern is constant.  Severe in severity. Characterized as "it just hurts."  No aggravating or alleviating factors.  Modifying factors include; massage and Advil.  She reports calling EMS yesterday who stated she had a GI bug, a temp of 102 and recommended she take Advil.  Associated symptoms include; nausea, she vomited today after coming to the ED.  She had a bowel movement this morning.  She denies recent diarrhea, constipation, weight loss or weight gain. She reports intermittent abdominal pain over the last few months. A CT of abdomen and pelvis showed a bowel obstruction due to incarcerated ventral hernia, 2 herniations are noted involving the transverse colon, no free air.  Normal lactic acid.  White count of 15.2K.    She had cookies this morning around 9AM and has been NPO since then.   Past Medical History  Diagnosis Date  . Hypothyroidism   . Lumbago   . Hyperlipidemia   . Hx of breast cancer   . Allergic rhinitis, cause unspecified   . Hypertension   . right shoulder pain, likely Impingement syndrome  09/09/2010  . OBESITY NOS 03/20/2006    Qualifier: Diagnosis of  By: Tomasa Hosteller MD, Veronique D.   . history of Bilateral leg edema-likely amlodipine induced. 07/28/2011  . history of CAP (community acquired pneumonia) 07/14/2011  . GERD (gastroesophageal reflux disease) 05/19/2011  . OSTEOARTHRITIS 12/13/2005    Annotation: bilateral knees;right knee injection 6/05 Qualifier: Diagnosis of  By: Tomasa Hosteller MD, Veronique D.   . VENOUS INSUFFICIENCY, CHRONIC 03/20/2006    Qualifier: Diagnosis of  By: Tomasa Hosteller MD, Veronique D.   . history of HYPOTHYROIDISM,  BORDERLINE 03/20/2006    Annotation: Asymptomatic, untreated Qualifier: Diagnosis of  By: Tomasa Hosteller MD, Edmon Crape.   . Chronic back pain   . Cancer   . Asthma     Past Surgical History  Procedure Laterality Date  . Total hip arthroplasty    . Breast lumpectomy    . Abdominal hysterectomy      Family History  Problem Relation Age of Onset  . Hypertension Mother   . Alzheimer's disease Mother   . Diabetes Sister    Social History:  reports that she quit smoking about 55 years ago. She has never used smokeless tobacco. She reports that she does not drink alcohol or use illicit drugs.  Allergies:  Allergies  Allergen Reactions  . Metoprolol Itching  . Other Rash    pickles   Medication: Tylenol #3 1 tablet PO TID PRN Albuterol HFA 52mg inhaler 2 puffs q6h PRN Amlodipine 598mPO daily Atorvastatin 4052mO daily citracal +D 315-200m32mit 1 tablet BID Carvedilol 6.25mg62mBID Lisinopril-hctz 20-35mg 31mblet PO daily protonix 40mg P62mily KCL 40mEq P11mily  (Not in a hospital admission)  Results for orders placed during the hospital encounter of 10/25/13 (from the past 48 hour(s))  URINALYSIS, ROUTINE W REFLEX MICROSCOPIC     Status: Abnormal   Collection Time    10/25/13 12:10 PM      Result Value Ref Range   Color, Urine YELLOW  YELLOW   APPearance CLOUDY (*) CLEAR   Specific Gravity, Urine 1.029  1.005 -  1.030   pH 5.5  5.0 - 8.0   Glucose, UA NEGATIVE  NEGATIVE mg/dL   Hgb urine dipstick SMALL (*) NEGATIVE   Bilirubin Urine SMALL (*) NEGATIVE   Ketones, ur 15 (*) NEGATIVE mg/dL   Protein, ur 100 (*) NEGATIVE mg/dL   Urobilinogen, UA 0.2  0.0 - 1.0 mg/dL   Nitrite NEGATIVE  NEGATIVE   Leukocytes, UA NEGATIVE  NEGATIVE  URINE MICROSCOPIC-ADD ON     Status: Abnormal   Collection Time    10/25/13 12:10 PM      Result Value Ref Range   Squamous Epithelial / LPF MANY (*) RARE   WBC, UA 3-6  <3 WBC/hpf   RBC / HPF 0-2  <3 RBC/hpf   Bacteria, UA MANY (*) RARE    Casts HYALINE CASTS (*) NEGATIVE  CBC WITH DIFFERENTIAL     Status: Abnormal   Collection Time    10/25/13 12:22 PM      Result Value Ref Range   WBC 15.2 (*) 4.0 - 10.5 K/uL   RBC 5.26 (*) 3.87 - 5.11 MIL/uL   Hemoglobin 14.1  12.0 - 15.0 g/dL   HCT 43.1  36.0 - 46.0 %   MCV 81.9  78.0 - 100.0 fL   MCH 26.8  26.0 - 34.0 pg   MCHC 32.7  30.0 - 36.0 g/dL   RDW 13.9  11.5 - 15.5 %   Platelets 278  150 - 400 K/uL   Neutrophils Relative % 89 (*) 43 - 77 %   Neutro Abs 13.5 (*) 1.7 - 7.7 K/uL   Lymphocytes Relative 7 (*) 12 - 46 %   Lymphs Abs 1.1  0.7 - 4.0 K/uL   Monocytes Relative 4  3 - 12 %   Monocytes Absolute 0.7  0.1 - 1.0 K/uL   Eosinophils Relative 0  0 - 5 %   Eosinophils Absolute 0.0  0.0 - 0.7 K/uL   Basophils Relative 0  0 - 1 %   Basophils Absolute 0.0  0.0 - 0.1 K/uL  COMPREHENSIVE METABOLIC PANEL     Status: Abnormal   Collection Time    10/25/13 12:22 PM      Result Value Ref Range   Sodium 139  137 - 147 mEq/L   Potassium 3.6 (*) 3.7 - 5.3 mEq/L   Chloride 95 (*) 96 - 112 mEq/L   CO2 25  19 - 32 mEq/L   Glucose, Bld 156 (*) 70 - 99 mg/dL   BUN 18  6 - 23 mg/dL   Creatinine, Ser 0.95  0.50 - 1.10 mg/dL   Calcium 10.1  8.4 - 10.5 mg/dL   Total Protein 8.6 (*) 6.0 - 8.3 g/dL   Albumin 4.0  3.5 - 5.2 g/dL   AST 22  0 - 37 U/L   ALT 7  0 - 35 U/L   Alkaline Phosphatase 61  39 - 117 U/L   Total Bilirubin 0.9  0.3 - 1.2 mg/dL   GFR calc non Af Amer 57 (*) >90 mL/min   GFR calc Af Amer 66 (*) >90 mL/min   Comment: (NOTE)     The eGFR has been calculated using the CKD EPI equation.     This calculation has not been validated in all clinical situations.     eGFR's persistently <90 mL/min signify possible Chronic Kidney     Disease.   Anion gap 19 (*) 5 - 15  LIPASE, BLOOD     Status: None  Collection Time    10/25/13 12:22 PM      Result Value Ref Range   Lipase 31  11 - 59 U/L  LACTIC ACID, PLASMA     Status: None   Collection Time    10/25/13 12:22  PM      Result Value Ref Range   Lactic Acid, Venous 1.7  0.5 - 2.2 mmol/L  TROPONIN I     Status: None   Collection Time    10/25/13 12:22 PM      Result Value Ref Range   Troponin I <0.30  <0.30 ng/mL   Comment:            Due to the release kinetics of cTnI,     a negative result within the first hours     of the onset of symptoms does not rule out     myocardial infarction with certainty.     If myocardial infarction is still suspected,     repeat the test at appropriate intervals.   Dg Chest 2 View  10/25/2013   CLINICAL DATA:  Abdominal pain severe shortness of breath and nausea.  EXAM: CHEST  2 VIEW  COMPARISON:  PA and lateral chest x-ray of June 28, 2011  FINDINGS: The lungs are markedly hypoinflated. There is bibasilar atelectasis. There is an air and fluid level under the left hemidiaphragm which likely lies within the distended stomach. There is a loop of distended bowel in the right mid epigastric region that is relatively featureless. Definite free extraluminal gas is not demonstrated.  IMPRESSION: 1. There is marked hypo inflation with small amounts of bibasilar atelectasis. There is no CHF. 2. Distended stomach and presumably small bowel is consistent with obstruction. An acute abdominal series or CT scanning is recommended.   Electronically Signed   By: David  Martinique   On: 10/25/2013 13:23   Ct Abdomen Pelvis W Contrast  10/25/2013   CLINICAL DATA:  Diffuse abdominal pain with nausea and vomiting  EXAM: CT ABDOMEN AND PELVIS WITH CONTRAST  TECHNIQUE: Multidetector CT imaging of the abdomen and pelvis was performed using the standard protocol following bolus administration of intravenous contrast.  CONTRAST:  12m OMNIPAQUE IOHEXOL 300 MG/ML  SOLN  COMPARISON:  None.  FINDINGS: There is mild atelectatic change in the left base. There is elevation of the left hemidiaphragm.  No focal liver lesions are identified. Gallbladder wall is not thickened. There is no appreciable biliary  duct dilatation.  Spleen, pancreas, and adrenals appear normal. Kidneys bilaterally show no evidence of mass or hydronephrosis. No renal or ureteral calculus on either side.  In the pelvis, the urinary bladder is midline with normal wall thickness. There is no pelvic mass or fluid collection. Uterus is absent. Appendix is appears normal.  There is a ventral hernia which contains loops of incarcerated large bowel. There is bowel obstruction involving the entire small bowel and the colon to the level of the transverse colon where the incarcerated bowel arises. There is no free air or portal venous air. The distal colon is decompressed. There are sigmoid diverticula without diverticulitis.  There is no free air or portal venous air. There is no ascites, adenopathy, or abscess in the abdomen or pelvis. There is atherosclerotic change in both common iliac arteries. There is no appreciable abdominal aortic aneurysm. There is a total hip prosthesis on the right. There are no blastic or lytic bone lesions. There is extensive arthropathy in the lumbar spine.  IMPRESSION: There is  bowel obstruction at the level of the mid transverse colon due to incarcerated loops of colon in a midline ventral hernia. There are actually 2 sites of herniation with incarceration in both regions which are essentially adjacent to each other involving the transverse colon region. There is diffuse bowel dilatation proximal to the loops of bowel which are incarcerated. No free air.  There are sigmoid diverticula without diverticulitis. Appendix region appears normal. No renal or ureteral calculus. No hydronephrosis.  Critical Value/emergent results were called by telephone at the time of interpretation on 10/25/2013 at 2:02 pm to Dr. Francine Graven , who verbally acknowledged these results.   Electronically Signed   By: Lowella Grip M.D.   On: 10/25/2013 14:04    Review of Systems  All other systems reviewed and are negative.   Blood  pressure 147/99, pulse 84, temperature 97.8 F (36.6 C), temperature source Oral, resp. rate 13, height 5' 1.5" (1.562 m), weight 220 lb (99.791 kg), SpO2 98.00%. Physical Exam  Constitutional: She appears well-developed and well-nourished. No distress.  Neck: Normal range of motion. Neck supple.  Cardiovascular: Normal rate, regular rhythm, normal heart sounds and intact distal pulses.  Exam reveals no gallop and no friction rub.   No murmur heard. Respiratory: Effort normal and breath sounds normal. No respiratory distress. She has no wheezes. She has no rales. She exhibits no tenderness.  GI: Soft. Bowel sounds are normal. There is tenderness in the right upper quadrant, epigastric area, periumbilical area and left upper quadrant. There is no rebound and no guarding. A hernia is present.    Lymphadenopathy:    She has no cervical adenopathy.  Skin: She is not diaphoretic.  Psychiatric: She has a normal mood and affect. Her behavior is normal. Judgment and thought content normal.     Assessment/Plan Hx hysterectomy and hernia repair Obesity  Colonic obstruction secondary to incarcerated ventral hernia Leukocytosis Normal lactic acid and no acute abdomen on exam.  We will place a NGT to LWIS now.  Keep NPO for now. We will likely proceed with surgery early next week.   -admit to inpatient -IV hydration -pain control -mobilize as tolerated -repeat labs in AM SCD/lovenox Hypokalemia -supplement in IVF, repeat labs in AM Hypertension -c/w home meds and clamp NGT for 2 hours after administration, add hydralazine PRN for SBP >170 Asthma -PRN albuterol GERD -protonix  Christophor Eick ANP-BC 10/25/2013, 2:51 PM

## 2013-10-25 NOTE — H&P (Signed)
Recurrent ventral hernia - involving mid-transverse colon Dilated proximal SB  NG tube Hernia seems reducible, but spontaneously protrudes when pressure released. Will likely need repair this hospitalization.  Imogene Burn. Georgette Dover, MD, [facility] Surgery  General/ Trauma Surgery  10/25/2013 4:00 PM

## 2013-10-25 NOTE — ED Provider Notes (Signed)
CSN: [id number]     Arrival date & time 10/25/13  1110 History   First MD Initiated Contact with Patient 10/25/13 1122     Chief Complaint  Patient presents with  . Abdominal Pain  . Nausea      HPI Pt was seen at 1135.  Per pt, c/o gradual onset and persistence of constant generalized abd "pain" for the past 2 days. States she ate on Wednesday at 1000/1100, and developed symptoms at 1530.  Has been associated with nausea. Describes the abd pain as "bloating."  Pt states she had a "normal" BM yesterday. Denies vomiting/diarrhea, no fevers, no back pain, no rash, no CP/SOB, no black or blood in stools.       Past Medical History  Diagnosis Date  . Hypothyroidism   . Lumbago   . Hyperlipidemia   . Hx of breast cancer   . Allergic rhinitis, cause unspecified   . Hypertension   . right shoulder pain, likely Impingement syndrome  09/09/2010  . OBESITY NOS 03/20/2006    Qualifier: Diagnosis of  By: Tomasa Hosteller MD, Veronique D.   . history of Bilateral leg edema-likely amlodipine induced. 07/28/2011  . history of CAP (community acquired pneumonia) 07/14/2011  . GERD (gastroesophageal reflux disease) 05/19/2011  . OSTEOARTHRITIS 12/13/2005    Annotation: bilateral knees;right knee injection 6/05 Qualifier: Diagnosis of  By: Tomasa Hosteller MD, Veronique D.   . VENOUS INSUFFICIENCY, CHRONIC 03/20/2006    Qualifier: Diagnosis of  By: Tomasa Hosteller MD, Veronique D.   . history of HYPOTHYROIDISM, BORDERLINE 03/20/2006    Annotation: Asymptomatic, untreated Qualifier: Diagnosis of  By: Tomasa Hosteller MD, Edmon Crape.   . Chronic back pain    Past Surgical History  Procedure Laterality Date  . Total hip arthroplasty    . Breast lumpectomy    . Abdominal hysterectomy     Family History  Problem Relation Age of Onset  . Hypertension Mother   . Alzheimer's disease Mother   . Diabetes Sister    History  Substance Use Topics  . Smoking status: Former Smoker    Quit date: 09/09/1958  . Smokeless tobacco: Never  Used  . Alcohol Use: No    Review of Systems ROS: Statement: All systems negative except as marked or noted in the HPI; Constitutional: Negative for fever and chills. ; ; Eyes: Negative for eye pain, redness and discharge. ; ; ENMT: Negative for ear pain, hoarseness, nasal congestion, sinus pressure and sore throat. ; ; Cardiovascular: Negative for chest pain, palpitations, diaphoresis, dyspnea and peripheral edema. ; ; Respiratory: Negative for cough, wheezing and stridor. ; ; Gastrointestinal: +nausea, abd pain. Negative for vomiting, diarrhea, blood in stool, hematemesis, jaundice and rectal bleeding. . ; ; Genitourinary: Negative for dysuria, flank pain and hematuria. ; ; Musculoskeletal: Negative for back pain and neck pain. Negative for swelling and trauma.; ; Skin: Negative for pruritus, rash, abrasions, blisters, bruising and skin lesion.; ; Neuro: Negative for headache, lightheadedness and neck stiffness. Negative for weakness, altered level of consciousness , altered mental status, extremity weakness, paresthesias, involuntary movement, seizure and syncope.      Allergies  Metoprolol and Other  Home Medications   Prior to Admission medications   Medication Sig Start Date End Date Taking? Authorizing Provider  Acetaminophen-Codeine (TYLENOL/CODEINE #3) 300-30 MG per tablet Take 1 tablet by mouth 3 (three) times daily as needed for pain. 09/19/13 09/19/14 Yes Nischal Narendra, MD  albuterol (PROVENTIL HFA;VENTOLIN HFA) 108 (90 BASE) MCG/ACT inhaler Inhale 2 puffs into the  lungs every 6 (six) hours as needed for wheezing. 10/15/12 10/25/13 Yes Alejandro Paya, DO  amLODipine (NORVASC) 5 MG tablet Take 5 mg by mouth daily.   Yes Historical Provider, MD  atorvastatin (LIPITOR) 40 MG tablet Take 1 tablet (40 mg total) by mouth daily.  07/18/14 Yes Aldine Contes, MD  calcium citrate-vitamin D (CITRACAL+D) 315-200 MG-UNIT per tablet Take 1 tablet by mouth 2 (two) times daily. 02/15/13  Yes  Alejandro Paya, DO  carvedilol (COREG) 6.25 MG tablet Take 1 tablet (6.25 mg total) by mouth 2 (two) times daily with a meal.   Yes Nischal Narendra, MD  lisinopril-hydrochlorothiazide (PRINZIDE,ZESTORETIC) 20-25 MG per tablet Take 1 tablet by mouth daily.   Yes Aldine Contes, MD  pantoprazole (PROTONIX) 40 MG tablet Take 40 mg by mouth daily.   Yes Historical Provider, MD  potassium chloride SA (K-DUR,KLOR-CON) 20 MEQ tablet Take 40 mEq by mouth daily.   Yes Historical Provider, MD   BP 135/79  Pulse 83  Temp(Src) 97.8 F (36.6 C) (Oral)  Resp 16  Ht 5' 1.5" (1.562 m)  Wt 220 lb (99.791 kg)  BMI 40.90 kg/m2  SpO2 98% Physical Exam 1140: Physical examination:  Nursing notes reviewed; Vital signs and O2 SAT reviewed;  Constitutional: Well developed, Well nourished, Well hydrated, In no acute distress; Head:  Normocephalic, atraumatic; Eyes: EOMI, PERRL, No scleral icterus; ENMT: Mouth and pharynx normal, Mucous membranes moist; Neck: Supple, Full range of motion, No lymphadenopathy; Cardiovascular: Regular rate and rhythm, No gallop; Respiratory: Breath sounds clear & equal bilaterally, No wheezes.  Speaking full sentences with ease, Normal respiratory effort/excursion; Chest: Nontender, Movement normal; Abdomen: Soft, +diffuse tenderness to palp, +distended, tympanitic. Normal bowel sounds; Genitourinary: No CVA tenderness; Extremities: Pulses normal, No tenderness, No edema, No calf edema or asymmetry.; Neuro: AA&Ox3, Major CN grossly intact.  Speech clear. No gross focal motor or sensory deficits in extremities.; Skin: Color normal, Warm, Dry.   ED Course  Procedures     EKG Interpretation None      MDM  MDM Reviewed: previous chart, nursing note and vitals Reviewed previous: labs and ECG Interpretation: labs, ECG, x-ray and CT scan    Results for orders placed during the hospital encounter of 10/25/13  URINALYSIS, ROUTINE W REFLEX MICROSCOPIC      Result Value Ref Range    Color, Urine YELLOW  YELLOW   APPearance CLOUDY (*) CLEAR   Specific Gravity, Urine 1.029  1.005 - 1.030   pH 5.5  5.0 - 8.0   Glucose, UA NEGATIVE  NEGATIVE mg/dL   Hgb urine dipstick SMALL (*) NEGATIVE   Bilirubin Urine SMALL (*) NEGATIVE   Ketones, ur 15 (*) NEGATIVE mg/dL   Protein, ur 100 (*) NEGATIVE mg/dL   Urobilinogen, UA 0.2  0.0 - 1.0 mg/dL   Nitrite NEGATIVE  NEGATIVE   Leukocytes, UA NEGATIVE  NEGATIVE  CBC WITH DIFFERENTIAL      Result Value Ref Range   WBC 15.2 (*) 4.0 - 10.5 K/uL   RBC 5.26 (*) 3.87 - 5.11 MIL/uL   Hemoglobin 14.1  12.0 - 15.0 g/dL   HCT 43.1  36.0 - 46.0 %   MCV 81.9  78.0 - 100.0 fL   MCH 26.8  26.0 - 34.0 pg   MCHC 32.7  30.0 - 36.0 g/dL   RDW 13.9  11.5 - 15.5 %   Platelets 278  150 - 400 K/uL   Neutrophils Relative % 89 (*) 43 - 77 %  Neutro Abs 13.5 (*) 1.7 - 7.7 K/uL   Lymphocytes Relative 7 (*) 12 - 46 %   Lymphs Abs 1.1  0.7 - 4.0 K/uL   Monocytes Relative 4  3 - 12 %   Monocytes Absolute 0.7  0.1 - 1.0 K/uL   Eosinophils Relative 0  0 - 5 %   Eosinophils Absolute 0.0  0.0 - 0.7 K/uL   Basophils Relative 0  0 - 1 %   Basophils Absolute 0.0  0.0 - 0.1 K/uL  COMPREHENSIVE METABOLIC PANEL      Result Value Ref Range   Sodium 139  137 - 147 mEq/L   Potassium 3.6 (*) 3.7 - 5.3 mEq/L   Chloride 95 (*) 96 - 112 mEq/L   CO2 25  19 - 32 mEq/L   Glucose, Bld 156 (*) 70 - 99 mg/dL   BUN 18  6 - 23 mg/dL   Creatinine, Ser 0.95  0.50 - 1.10 mg/dL   Calcium 10.1  8.4 - 10.5 mg/dL   Total Protein 8.6 (*) 6.0 - 8.3 g/dL   Albumin 4.0  3.5 - 5.2 g/dL   AST 22  0 - 37 U/L   ALT 7  0 - 35 U/L   Alkaline Phosphatase 61  39 - 117 U/L   Total Bilirubin 0.9  0.3 - 1.2 mg/dL   GFR calc non Af Amer 57 (*) >90 mL/min   GFR calc Af Amer 66 (*) >90 mL/min   Anion gap 19 (*) 5 - 15  LIPASE, BLOOD      Result Value Ref Range   Lipase 31  11 - 59 U/L  LACTIC ACID, PLASMA      Result Value Ref Range   Lactic Acid, Venous 1.7  0.5 - 2.2 mmol/L   TROPONIN I      Result Value Ref Range   Troponin I <0.30  <0.30 ng/mL  URINE MICROSCOPIC-ADD ON      Result Value Ref Range   Squamous Epithelial / LPF MANY (*) RARE   WBC, UA 3-6  <3 WBC/hpf   RBC / HPF 0-2  <3 RBC/hpf   Bacteria, UA MANY (*) RARE   Casts HYALINE CASTS (*) NEGATIVE   Dg Chest 2 View 10/25/2013   CLINICAL DATA:  Abdominal pain severe shortness of breath and nausea.  EXAM: CHEST  2 VIEW  COMPARISON:  PA and lateral chest x-ray of June 28, 2011  FINDINGS: The lungs are markedly hypoinflated. There is bibasilar atelectasis. There is an air and fluid level under the left hemidiaphragm which likely lies within the distended stomach. There is a loop of distended bowel in the right mid epigastric region that is relatively featureless. Definite free extraluminal gas is not demonstrated.  IMPRESSION: 1. There is marked hypo inflation with small amounts of bibasilar atelectasis. There is no CHF. 2. Distended stomach and presumably small bowel is consistent with obstruction. An acute abdominal series or CT scanning is recommended.   Electronically Signed   By: David  Martinique   On: 10/25/2013 13:23   Ct Abdomen Pelvis W Contrast 10/25/2013   CLINICAL DATA:  Diffuse abdominal pain with nausea and vomiting  EXAM: CT ABDOMEN AND PELVIS WITH CONTRAST  TECHNIQUE: Multidetector CT imaging of the abdomen and pelvis was performed using the standard protocol following bolus administration of intravenous contrast.  CONTRAST:  136mL OMNIPAQUE IOHEXOL 300 MG/ML  SOLN  COMPARISON:  None.  FINDINGS: There is mild atelectatic change in the left base.  There is elevation of the left hemidiaphragm.  No focal liver lesions are identified. Gallbladder wall is not thickened. There is no appreciable biliary duct dilatation.  Spleen, pancreas, and adrenals appear normal. Kidneys bilaterally show no evidence of mass or hydronephrosis. No renal or ureteral calculus on either side.  In the pelvis, the urinary bladder is  midline with normal wall thickness. There is no pelvic mass or fluid collection. Uterus is absent. Appendix is appears normal.  There is a ventral hernia which contains loops of incarcerated large bowel. There is bowel obstruction involving the entire small bowel and the colon to the level of the transverse colon where the incarcerated bowel arises. There is no free air or portal venous air. The distal colon is decompressed. There are sigmoid diverticula without diverticulitis.  There is no free air or portal venous air. There is no ascites, adenopathy, or abscess in the abdomen or pelvis. There is atherosclerotic change in both common iliac arteries. There is no appreciable abdominal aortic aneurysm. There is a total hip prosthesis on the right. There are no blastic or lytic bone lesions. There is extensive arthropathy in the lumbar spine.  IMPRESSION: There is bowel obstruction at the level of the mid transverse colon due to incarcerated loops of colon in a midline ventral hernia. There are actually 2 sites of herniation with incarceration in both regions which are essentially adjacent to each other involving the transverse colon region. There is diffuse bowel dilatation proximal to the loops of bowel which are incarcerated. No free air.  There are sigmoid diverticula without diverticulitis. Appendix region appears normal. No renal or ureteral calculus. No hydronephrosis.  Critical Value/emergent results were called by telephone at the time of interpretation on 10/25/2013 at 2:02 pm to Dr. Francine Graven , who verbally acknowledged these results.   Electronically Signed   By: Lowella Grip M.D.   On: 10/25/2013 14:04    1420:  Will place NGT. Dx and testing d/w pt and family.  Questions answered.  Verb understanding, agreeable to admit.  T/C to General Surgery, case discussed, including:  HPI, pertinent PM/SHx, VS/PE, dx testing, ED course and treatment:  Agreeable to come to ED for evaluation.      Francine Graven, DO 10/26/13 [phone]

## 2013-10-25 NOTE — ED Notes (Signed)
Patient transported by [facility] from home for complaint of upper abdominal pain with nausea and some dyspnea starting on Wednesday around 1530.  Patient given 4 mg Zofran  IV @ 3578 by EMS.

## 2013-10-25 NOTE — ED Notes (Signed)
Two RNs attempted NG insertion, met severe resistance with both nares. Unable to insert at this time.

## 2013-10-26 LAB — CBC
HEMATOCRIT: 37.2 % (ref 36.0–46.0)
HEMOGLOBIN: 11.8 g/dL — AB (ref 12.0–15.0)
MCH: 25.5 pg — AB (ref 26.0–34.0)
MCHC: 31.7 g/dL (ref 30.0–36.0)
MCV: 80.5 fL (ref 78.0–100.0)
Platelets: 233 10*3/uL (ref 150–400)
RBC: 4.62 MIL/uL (ref 3.87–5.11)
RDW: 13.9 % (ref 11.5–15.5)
WBC: 10.1 10*3/uL (ref 4.0–10.5)

## 2013-10-26 LAB — URINE CULTURE: Colony Count: >=100000

## 2013-10-26 LAB — BASIC METABOLIC PANEL
Anion gap: 17 — ABNORMAL HIGH (ref 5–15)
BUN: 26 mg/dL — AB (ref 6–23)
CHLORIDE: 97 meq/L (ref 96–112)
CO2: 23 meq/L (ref 19–32)
Calcium: 8.9 mg/dL (ref 8.4–10.5)
Creatinine, Ser: 1.13 mg/dL — ABNORMAL HIGH (ref 0.50–1.10)
GFR calc Af Amer: 54 mL/min — ABNORMAL LOW (ref >90)
GFR calc non Af Amer: 46 mL/min — ABNORMAL LOW (ref >90)
Glucose, Bld: 168 mg/dL — ABNORMAL HIGH (ref 70–99)
Potassium: 3.2 mEq/L — ABNORMAL LOW (ref 3.7–5.3)
Sodium: 137 mEq/L (ref 137–147)

## 2013-10-26 NOTE — Progress Notes (Signed)
Subjective: Unable to place NG tube last night No more nausea/ vomiting Patient has had several episodes of diarrhea since the hernia was reduced Less abdominal pain/ distention  Objective: Vital signs in last 24 hours: Temp:  [97.8 F (36.6 C)-98.9 F (37.2 C)] 98.3 F (36.8 C) (08/22 0513) Pulse Rate:  [79-90] 88 (08/22 0513) Resp:  [13-16] 16 (08/22 0513) BP: (103-153)/(43-101) 103/62 mmHg (08/22 0513) SpO2:  [90 %-99 %] 97 % (08/22 0513) Weight:  [220 lb (99.791 kg)-236 lb 12.4 oz (107.4 kg)] 236 lb 12.4 oz (107.4 kg) (08/21 1635) Last BM Date: 10/26/13  Intake/Output from previous day: 08/21 0701 - 08/22 0700 In: -  Out: 200 [Urine:200] Intake/Output this shift:    General appearance: alert, cooperative and no distress GI: obese, soft, protruding ventral hernia - reducible; audible bowel sounds in hernia  Lab Results:   Recent Labs  10/25/13 1222 10/26/13 0557  WBC 15.2* 10.1  HGB 14.1 11.8*  HCT 43.1 37.2  PLT 278 233   BMET  Recent Labs  10/25/13 1222 10/26/13 0557  NA 139 137  K 3.6* 3.2*  CL 95* 97  CO2 25 23  GLUCOSE 156* 168*  BUN 18 26*  CREATININE 0.95 1.13*  CALCIUM 10.1 8.9   PT/INR No results found for this basename: LABPROT, INR,  in the last 72 hours ABG No results found for this basename: PHART, PCO2, PO2, HCO3,  in the last 72 hours  Studies/Results: Dg Chest 2 View  10/25/2013   CLINICAL DATA:  Abdominal pain severe shortness of breath and nausea.  EXAM: CHEST  2 VIEW  COMPARISON:  PA and lateral chest x-ray of June 28, 2011  FINDINGS: The lungs are markedly hypoinflated. There is bibasilar atelectasis. There is an air and fluid level under the left hemidiaphragm which likely lies within the distended stomach. There is a loop of distended bowel in the right mid epigastric region that is relatively featureless. Definite free extraluminal gas is not demonstrated.  IMPRESSION: 1. There is marked hypo inflation with small amounts of  bibasilar atelectasis. There is no CHF. 2. Distended stomach and presumably small bowel is consistent with obstruction. An acute abdominal series or CT scanning is recommended.   Electronically Signed   By: David  Martinique   On: 10/25/2013 13:23   Ct Abdomen Pelvis W Contrast  10/25/2013   CLINICAL DATA:  Diffuse abdominal pain with nausea and vomiting  EXAM: CT ABDOMEN AND PELVIS WITH CONTRAST  TECHNIQUE: Multidetector CT imaging of the abdomen and pelvis was performed using the standard protocol following bolus administration of intravenous contrast.  CONTRAST:  138mL OMNIPAQUE IOHEXOL 300 MG/ML  SOLN  COMPARISON:  None.  FINDINGS: There is mild atelectatic change in the left base. There is elevation of the left hemidiaphragm.  No focal liver lesions are identified. Gallbladder wall is not thickened. There is no appreciable biliary duct dilatation.  Spleen, pancreas, and adrenals appear normal. Kidneys bilaterally show no evidence of mass or hydronephrosis. No renal or ureteral calculus on either side.  In the pelvis, the urinary bladder is midline with normal wall thickness. There is no pelvic mass or fluid collection. Uterus is absent. Appendix is appears normal.  There is a ventral hernia which contains loops of incarcerated large bowel. There is bowel obstruction involving the entire small bowel and the colon to the level of the transverse colon where the incarcerated bowel arises. There is no free air or portal venous air. The distal colon is decompressed.  There are sigmoid diverticula without diverticulitis.  There is no free air or portal venous air. There is no ascites, adenopathy, or abscess in the abdomen or pelvis. There is atherosclerotic change in both common iliac arteries. There is no appreciable abdominal aortic aneurysm. There is a total hip prosthesis on the right. There are no blastic or lytic bone lesions. There is extensive arthropathy in the lumbar spine.  IMPRESSION: There is bowel  obstruction at the level of the mid transverse colon due to incarcerated loops of colon in a midline ventral hernia. There are actually 2 sites of herniation with incarceration in both regions which are essentially adjacent to each other involving the transverse colon region. There is diffuse bowel dilatation proximal to the loops of bowel which are incarcerated. No free air.  There are sigmoid diverticula without diverticulitis. Appendix region appears normal. No renal or ureteral calculus. No hydronephrosis.  Critical Value/emergent results were called by telephone at the time of interpretation on 10/25/2013 at 2:02 pm to Dr. Francine Graven , who verbally acknowledged these results.   Electronically Signed   By: Lowella Grip M.D.   On: 10/25/2013 14:04    Anti-infectives: Anti-infectives   None      Assessment/Plan: s/p * No surgery found * Recurrent ventral hernia with incarcerated colon - now reduced Clear liquids Probable ventral hernia repair with mesh early in the week.   LOS: 1 day    Chanz Cahall K. 10/26/2013

## 2013-10-27 ENCOUNTER — Inpatient Hospital Stay (HOSPITAL_COMMUNITY): Payer: Medicare HMO

## 2013-10-27 MED ORDER — ALBUTEROL SULFATE (2.5 MG/3ML) 0.083% IN NEBU
2.5000 mg | INHALATION_SOLUTION | Freq: Four times a day (QID) | RESPIRATORY_TRACT | Status: DC
  Administered 2013-10-28 – 2013-10-29 (×7): 2.5 mg via RESPIRATORY_TRACT
  Filled 2013-10-27 (×8): qty 3

## 2013-10-27 MED ORDER — ALBUTEROL SULFATE (2.5 MG/3ML) 0.083% IN NEBU
2.5000 mg | INHALATION_SOLUTION | RESPIRATORY_TRACT | Status: DC | PRN
  Administered 2013-10-27 – 2013-10-31 (×7): 2.5 mg via RESPIRATORY_TRACT
  Filled 2013-10-27 (×7): qty 3

## 2013-10-27 NOTE — Progress Notes (Signed)
Caled to room at 2300 per floor RN for pt with respiratory distress. Upon my arrival at 2310 pt found resting in bed. Admits to some difficulty breathing. Pt just received breathing treatment at 2242 with some improvement in her breathing. 02 sat 98-100% on 2 LNC, rr 25, abnormal breathing, VSS. Pt alert oriented denies pain. Lung sounds diminished bilateral, with wheezing auscultated in upper airway and bilat upper lobes. Dr.Toth called prior to my arrival to update on pt status, cxr ordered.  PCXR obtained STAT. Results unchanged from prior CXR 8/21.RN to update Dr.Toth on new results. Pt falls asleep easily, left sleeping in bed. RN to monitor closely and utilize PRN Nebs. RN advised to notify myself and provider for worsening changes.

## 2013-10-27 NOTE — Progress Notes (Signed)
Subjective: Having some cramping abdominal pain Loose bm x 1  Objective: Vital signs in last 24 hours: Temp:  [98.2 F (36.8 C)-98.7 F (37.1 C)] 98.7 F (37.1 C) (08/23 0557) Pulse Rate:  [71-87] 81 (08/23 0557) Resp:  [16-19] 17 (08/23 0557) BP: (91-132)/(57-68) 101/59 mmHg (08/23 0557) SpO2:  [93 %-98 %] 98 % (08/23 0557) Last BM Date: 10/26/13  Intake/Output from previous day: 08/22 0701 - 08/23 0700 In: 3978.8 [P.O.:560; I.V.:3418.8] Out: -  Intake/Output this shift:    Abdomen soft, hernia easily reducible, minimally tender  Lab Results:   Recent Labs  10/25/13 1222 10/26/13 0557  WBC 15.2* 10.1  HGB 14.1 11.8*  HCT 43.1 37.2  PLT 278 233   BMET  Recent Labs  10/25/13 1222 10/26/13 0557  NA 139 137  K 3.6* 3.2*  CL 95* 97  CO2 25 23  GLUCOSE 156* 168*  BUN 18 26*  CREATININE 0.95 1.13*  CALCIUM 10.1 8.9   PT/INR No results found for this basename: LABPROT, INR,  in the last 72 hours ABG No results found for this basename: PHART, PCO2, PO2, HCO3,  in the last 72 hours  Studies/Results: Dg Chest 2 View  10/25/2013   CLINICAL DATA:  Abdominal pain severe shortness of breath and nausea.  EXAM: CHEST  2 VIEW  COMPARISON:  PA and lateral chest x-ray of June 28, 2011  FINDINGS: The lungs are markedly hypoinflated. There is bibasilar atelectasis. There is an air and fluid level under the left hemidiaphragm which likely lies within the distended stomach. There is a loop of distended bowel in the right mid epigastric region that is relatively featureless. Definite free extraluminal gas is not demonstrated.  IMPRESSION: 1. There is marked hypo inflation with small amounts of bibasilar atelectasis. There is no CHF. 2. Distended stomach and presumably small bowel is consistent with obstruction. An acute abdominal series or CT scanning is recommended.   Electronically Signed   By: David  Martinique   On: 10/25/2013 13:23   Ct Abdomen Pelvis W Contrast  10/25/2013    CLINICAL DATA:  Diffuse abdominal pain with nausea and vomiting  EXAM: CT ABDOMEN AND PELVIS WITH CONTRAST  TECHNIQUE: Multidetector CT imaging of the abdomen and pelvis was performed using the standard protocol following bolus administration of intravenous contrast.  CONTRAST:  121mL OMNIPAQUE IOHEXOL 300 MG/ML  SOLN  COMPARISON:  None.  FINDINGS: There is mild atelectatic change in the left base. There is elevation of the left hemidiaphragm.  No focal liver lesions are identified. Gallbladder wall is not thickened. There is no appreciable biliary duct dilatation.  Spleen, pancreas, and adrenals appear normal. Kidneys bilaterally show no evidence of mass or hydronephrosis. No renal or ureteral calculus on either side.  In the pelvis, the urinary bladder is midline with normal wall thickness. There is no pelvic mass or fluid collection. Uterus is absent. Appendix is appears normal.  There is a ventral hernia which contains loops of incarcerated large bowel. There is bowel obstruction involving the entire small bowel and the colon to the level of the transverse colon where the incarcerated bowel arises. There is no free air or portal venous air. The distal colon is decompressed. There are sigmoid diverticula without diverticulitis.  There is no free air or portal venous air. There is no ascites, adenopathy, or abscess in the abdomen or pelvis. There is atherosclerotic change in both common iliac arteries. There is no appreciable abdominal aortic aneurysm. There is a total hip  prosthesis on the right. There are no blastic or lytic bone lesions. There is extensive arthropathy in the lumbar spine.  IMPRESSION: There is bowel obstruction at the level of the mid transverse colon due to incarcerated loops of colon in a midline ventral hernia. There are actually 2 sites of herniation with incarceration in both regions which are essentially adjacent to each other involving the transverse colon region. There is diffuse  bowel dilatation proximal to the loops of bowel which are incarcerated. No free air.  There are sigmoid diverticula without diverticulitis. Appendix region appears normal. No renal or ureteral calculus. No hydronephrosis.  Critical Value/emergent results were called by telephone at the time of interpretation on 10/25/2013 at 2:02 pm to Dr. Francine Graven , who verbally acknowledged these results.   Electronically Signed   By: Lowella Grip M.D.   On: 10/25/2013 14:04    Anti-infectives: Anti-infectives   None      Assessment/Plan:  Repeat labs in the morning Anticipate hernia repair this week pending medical condition Npo after midnight tonight in case surgery can be done tomorrow pending the schedule  LOS: 2 days    [name] A 10/27/2013

## 2013-10-28 DIAGNOSIS — S37009A Unspecified injury of unspecified kidney, initial encounter: Secondary | ICD-10-CM

## 2013-10-28 LAB — CBC
HEMATOCRIT: 32.6 % — AB (ref 36.0–46.0)
Hemoglobin: 10.6 g/dL — ABNORMAL LOW (ref 12.0–15.0)
MCH: 26.6 pg (ref 26.0–34.0)
MCHC: 32.5 g/dL (ref 30.0–36.0)
MCV: 81.9 fL (ref 78.0–100.0)
PLATELETS: 220 10*3/uL (ref 150–400)
RBC: 3.98 MIL/uL (ref 3.87–5.11)
RDW: 13.9 % (ref 11.5–15.5)
WBC: 7.3 10*3/uL (ref 4.0–10.5)

## 2013-10-28 LAB — BASIC METABOLIC PANEL
ANION GAP: 12 (ref 5–15)
BUN: 49 mg/dL — AB (ref 6–23)
CALCIUM: 7.9 mg/dL — AB (ref 8.4–10.5)
CO2: 23 meq/L (ref 19–32)
CREATININE: 1.4 mg/dL — AB (ref 0.50–1.10)
Chloride: 100 mEq/L (ref 96–112)
GFR calc Af Amer: 41 mL/min — ABNORMAL LOW (ref >90)
GFR calc non Af Amer: 36 mL/min — ABNORMAL LOW (ref >90)
Glucose, Bld: 135 mg/dL — ABNORMAL HIGH (ref 70–99)
Potassium: 3.3 mEq/L — ABNORMAL LOW (ref 3.7–5.3)
Sodium: 135 mEq/L — ABNORMAL LOW (ref 137–147)

## 2013-10-28 MED ORDER — WHITE PETROLATUM GEL
Status: AC
  Administered 2013-10-28: 0.2
  Filled 2013-10-28: qty 5

## 2013-10-28 NOTE — Progress Notes (Signed)
Patient ID: [name], female   DOB: 09-Aug-1935, 78 y.o.   MRN: [id number]    Subjective: Had some SOB last night, several BMs yesterday  Objective: Vital signs in last 24 hours: Temp:  [98 F (36.7 C)-98.5 F (36.9 C)] 98.5 F (36.9 C) (08/24 0601) Pulse Rate:  [74-82] 74 (08/24 0601) Resp:  [19-28] 20 (08/24 0601) BP: (103-120)/(48-65) 113/48 mmHg (08/24 0601) SpO2:  [96 %-100 %] 98 % (08/24 0601) Last BM Date: 10/27/13  Intake/Output from previous day: 08/23 0701 - 08/24 0700 In: 1801.3 [P.O.:600; I.V.:1201.3] Out: -  Intake/Output this shift:    General appearance: cooperative Resp: exp wheeze B Cardio: regular rate and rhythm GI: soft, hernia reduces but spontaneously recurs, +BS IS<500  Lab Results: CBC   Recent Labs  10/26/13 0557 10/28/13 0534  WBC 10.1 7.3  HGB 11.8* 10.6*  HCT 37.2 32.6*  PLT 233 220   BMET  Recent Labs  10/26/13 0557 10/28/13 0534  NA 137 135*  K 3.2* 3.3*  CL 97 100  CO2 23 23  GLUCOSE 168* 135*  BUN 26* 49*  CREATININE 1.13* 1.40*  CALCIUM 8.9 7.9*   PT/INR No results found for this basename: LABPROT, INR,  in the last 72 hours ABG No results found for this basename: PHART, PCO2, PO2, HCO3,  in the last 72 hours  Studies/Results: Dg Chest Port 1 View  10/27/2013   CLINICAL DATA:  Shortness of breath.  EXAM: PORTABLE CHEST - 1 VIEW  COMPARISON:  10/25/2013  FINDINGS: Shallow inspiration with elevation of right and left hemidiaphragms. Atelectasis in both lung bases. Heart size and pulmonary vascularity appear normal. No pneumothorax. Calcification of the aorta. Degenerative changes in the right shoulder and spine.  IMPRESSION: Shallow inspiration with atelectasis in the lung bases similar to previous study.   Electronically Signed   By: Lucienne Capers M.D.   On: 10/27/2013 23:30    Anti-infectives: Anti-infectives   None      Assessment/Plan: Recurrent incisional hernia - CRT elevated today and wheezing worse so  hold off on OR. See below. If se improves medically, will proceed with hernia repair this week. As her obstruction has resolved, we can delay if needed. AKI - mild, increase IVF and F/U Resp failure/atelectasis - BDs and pulmonary toilet VTE - lovenox I spoke with her daughter  LOS: 3 days    Georganna Skeans, MD, MPH, FACS Trauma: [phone] General Surgery: [phone]  10/28/2013

## 2013-10-28 NOTE — Progress Notes (Signed)
Pt presented with respiratory distress, wheezing and shortness of breath noted on auscultation. Respiratory therapist and rapid response contacted. Breathing treatments given. Portable chest x-ray done per MD orders. Will continue to monitor pt for worsened respiratory conditions.

## 2013-10-29 ENCOUNTER — Inpatient Hospital Stay (HOSPITAL_COMMUNITY): Payer: Medicare HMO | Admitting: Certified Registered"

## 2013-10-29 ENCOUNTER — Encounter (HOSPITAL_COMMUNITY): Admission: EM | Disposition: A | Discharge status: Home Health | Payer: Self-pay | Source: Home / Self Care

## 2013-10-29 ENCOUNTER — Encounter (HOSPITAL_COMMUNITY): Payer: Self-pay | Admitting: Certified Registered"

## 2013-10-29 ENCOUNTER — Encounter (HOSPITAL_COMMUNITY): Payer: Medicare HMO | Admitting: Certified Registered"

## 2013-10-29 DIAGNOSIS — K432 Incisional hernia without obstruction or gangrene: Secondary | ICD-10-CM

## 2013-10-29 HISTORY — PX: INCISIONAL HERNIA REPAIR: SHX193

## 2013-10-29 LAB — SURGICAL PCR SCREEN
MRSA, PCR: NEGATIVE
Staphylococcus aureus: NEGATIVE

## 2013-10-29 LAB — BASIC METABOLIC PANEL
ANION GAP: 11 (ref 5–15)
BUN: 20 mg/dL (ref 6–23)
CALCIUM: 8 mg/dL — AB (ref 8.4–10.5)
CO2: 24 mEq/L (ref 19–32)
CREATININE: 0.81 mg/dL (ref 0.50–1.10)
Chloride: 101 mEq/L (ref 96–112)
GFR, EST AFRICAN AMERICAN: 80 mL/min — AB (ref >90)
GFR, EST NON AFRICAN AMERICAN: 69 mL/min — AB (ref >90)
Glucose, Bld: 144 mg/dL — ABNORMAL HIGH (ref 70–99)
Potassium: 3.5 mEq/L — ABNORMAL LOW (ref 3.7–5.3)
Sodium: 136 mEq/L — ABNORMAL LOW (ref 137–147)

## 2013-10-29 LAB — CBC
HEMATOCRIT: 34.9 % — AB (ref 36.0–46.0)
Hemoglobin: 11.2 g/dL — ABNORMAL LOW (ref 12.0–15.0)
MCH: 26.4 pg (ref 26.0–34.0)
MCHC: 32.1 g/dL (ref 30.0–36.0)
MCV: 82.3 fL (ref 78.0–100.0)
PLATELETS: 244 10*3/uL (ref 150–400)
RBC: 4.24 MIL/uL (ref 3.87–5.11)
RDW: 14 % (ref 11.5–15.5)
WBC: 5.4 10*3/uL (ref 4.0–10.5)

## 2013-10-29 SURGERY — REPAIR, HERNIA, INCISIONAL
Anesthesia: General | Site: Abdomen

## 2013-10-29 MED ORDER — GLYCOPYRROLATE 0.2 MG/ML IJ SOLN
INTRAMUSCULAR | Status: DC | PRN
  Administered 2013-10-29: .8 mg via INTRAVENOUS

## 2013-10-29 MED ORDER — ALBUTEROL SULFATE HFA 108 (90 BASE) MCG/ACT IN AERS
INHALATION_SPRAY | RESPIRATORY_TRACT | Status: DC | PRN
  Administered 2013-10-29 (×2): 2 via RESPIRATORY_TRACT

## 2013-10-29 MED ORDER — 0.9 % SODIUM CHLORIDE (POUR BTL) OPTIME
TOPICAL | Status: DC | PRN
  Administered 2013-10-29: 1000 mL

## 2013-10-29 MED ORDER — EPHEDRINE SULFATE 50 MG/ML IJ SOLN
INTRAMUSCULAR | Status: AC
Start: 2013-10-29 — End: 2013-10-29
  Filled 2013-10-29: qty 1

## 2013-10-29 MED ORDER — FENTANYL CITRATE 0.05 MG/ML IJ SOLN
25.0000 ug | INTRAMUSCULAR | Status: DC | PRN
  Administered 2013-10-29 (×3): 25 ug via INTRAVENOUS

## 2013-10-29 MED ORDER — NALOXONE HCL 0.4 MG/ML IJ SOLN: 0.4000 mg | INTRAMUSCULAR | Status: DC | PRN

## 2013-10-29 MED ORDER — MEPERIDINE HCL 25 MG/ML IJ SOLN: 6.2500 mg | INTRAMUSCULAR | Status: DC | PRN

## 2013-10-29 MED ORDER — ROCURONIUM BROMIDE 50 MG/5ML IV SOLN
INTRAVENOUS | Status: AC
  Filled 2013-10-29: qty 1

## 2013-10-29 MED ORDER — PHENYLEPHRINE 40 MCG/ML (10ML) SYRINGE FOR IV PUSH (FOR BLOOD PRESSURE SUPPORT)
PREFILLED_SYRINGE | INTRAVENOUS | Status: AC
  Filled 2013-10-29: qty 10

## 2013-10-29 MED ORDER — PROMETHAZINE HCL 25 MG/ML IJ SOLN: 6.2500 mg | INTRAMUSCULAR | Status: DC | PRN

## 2013-10-29 MED ORDER — FENTANYL CITRATE 0.05 MG/ML IJ SOLN
INTRAMUSCULAR | Status: DC | PRN
  Administered 2013-10-29 (×2): 50 ug via INTRAVENOUS

## 2013-10-29 MED ORDER — NEOSTIGMINE METHYLSULFATE 10 MG/10ML IV SOLN
INTRAVENOUS | Status: DC | PRN
  Administered 2013-10-29: 5 mg via INTRAVENOUS

## 2013-10-29 MED ORDER — GLYCOPYRROLATE 0.2 MG/ML IJ SOLN
INTRAMUSCULAR | Status: AC
  Filled 2013-10-29: qty 1

## 2013-10-29 MED ORDER — SODIUM CHLORIDE 0.9 % IJ SOLN
INTRAMUSCULAR | Status: AC
  Filled 2013-10-29: qty 10

## 2013-10-29 MED ORDER — BUPIVACAINE-EPINEPHRINE 0.25% -1:200000 IJ SOLN
INTRAMUSCULAR | Status: DC | PRN
  Administered 2013-10-29: 30 mL

## 2013-10-29 MED ORDER — DIPHENHYDRAMINE HCL 12.5 MG/5ML PO ELIX: 12.5000 mg | ORAL_SOLUTION | Freq: Four times a day (QID) | ORAL | Status: DC | PRN

## 2013-10-29 MED ORDER — GLYCOPYRROLATE 0.2 MG/ML IJ SOLN
INTRAMUSCULAR | Status: AC
  Filled 2013-10-29: qty 3

## 2013-10-29 MED ORDER — ONDANSETRON HCL 4 MG/2ML IJ SOLN
INTRAMUSCULAR | Status: DC | PRN
  Administered 2013-10-29: 4 mg via INTRAVENOUS

## 2013-10-29 MED ORDER — ALBUTEROL SULFATE (2.5 MG/3ML) 0.083% IN NEBU
INHALATION_SOLUTION | RESPIRATORY_TRACT | Status: AC
  Filled 2013-10-29: qty 3

## 2013-10-29 MED ORDER — ROCURONIUM BROMIDE 100 MG/10ML IV SOLN
INTRAVENOUS | Status: DC | PRN
  Administered 2013-10-29: 10 mg via INTRAVENOUS
  Administered 2013-10-29: 20 mg via INTRAVENOUS
  Administered 2013-10-29: 10 mg via INTRAVENOUS

## 2013-10-29 MED ORDER — LACTATED RINGERS IV SOLN
INTRAVENOUS | Status: DC | PRN
  Administered 2013-10-29: 14:00:00 via INTRAVENOUS

## 2013-10-29 MED ORDER — DEXTROSE 5 % IV SOLN
10.0000 mg/h | INTRAVENOUS | Status: AC
  Administered 2013-10-29: 0.7 mg/kg/h via INTRAVENOUS
  Filled 2013-10-29: qty 20

## 2013-10-29 MED ORDER — DIPHENHYDRAMINE HCL 50 MG/ML IJ SOLN: 12.5000 mg | Freq: Four times a day (QID) | INTRAMUSCULAR | Status: DC | PRN

## 2013-10-29 MED ORDER — SUCCINYLCHOLINE CHLORIDE 20 MG/ML IJ SOLN
INTRAMUSCULAR | Status: AC
  Filled 2013-10-29: qty 1

## 2013-10-29 MED ORDER — ALBUTEROL SULFATE HFA 108 (90 BASE) MCG/ACT IN AERS
INHALATION_SPRAY | RESPIRATORY_TRACT | Status: AC
  Filled 2013-10-29: qty 6.7

## 2013-10-29 MED ORDER — LIDOCAINE HCL (CARDIAC) 20 MG/ML IV SOLN
INTRAVENOUS | Status: AC
  Filled 2013-10-29: qty 5

## 2013-10-29 MED ORDER — FENTANYL CITRATE 0.05 MG/ML IJ SOLN
INTRAMUSCULAR | Status: AC
  Filled 2013-10-29: qty 2

## 2013-10-29 MED ORDER — BUPIVACAINE-EPINEPHRINE (PF) 0.25% -1:200000 IJ SOLN
INTRAMUSCULAR | Status: AC
  Filled 2013-10-29: qty 30

## 2013-10-29 MED ORDER — MORPHINE SULFATE (PF) 1 MG/ML IV SOLN
INTRAVENOUS | Status: DC
  Administered 2013-10-29: 19:00:00 via INTRAVENOUS
  Administered 2013-10-30: 10.5 mg via INTRAVENOUS
  Administered 2013-10-30: 4.08 mg via INTRAVENOUS
  Administered 2013-10-30: via INTRAVENOUS
  Administered 2013-10-30: 8.99 mg via INTRAVENOUS
  Administered 2013-10-30: 10.5 mg via INTRAVENOUS
  Administered 2013-10-30: 4.5 mg via INTRAVENOUS
  Administered 2013-10-30: 10:00:00 via INTRAVENOUS
  Administered 2013-10-30: 13.5 mg via INTRAVENOUS
  Administered 2013-10-30: 23.66 mg via INTRAVENOUS
  Filled 2013-10-29 (×4): qty 25

## 2013-10-29 MED ORDER — LIDOCAINE HCL (CARDIAC) 20 MG/ML IV SOLN
INTRAVENOUS | Status: DC | PRN
  Administered 2013-10-29: 40 mg via INTRAVENOUS

## 2013-10-29 MED ORDER — SODIUM CHLORIDE 0.9 % IJ SOLN: 9.0000 mL | INTRAMUSCULAR | Status: DC | PRN

## 2013-10-29 MED ORDER — FENTANYL CITRATE 0.05 MG/ML IJ SOLN
INTRAMUSCULAR | Status: AC
  Filled 2013-10-29: qty 5

## 2013-10-29 MED ORDER — PROPOFOL 10 MG/ML IV BOLUS
INTRAVENOUS | Status: DC | PRN
  Administered 2013-10-29: 120 mg via INTRAVENOUS

## 2013-10-29 MED ORDER — THEOPHYLLINE IN D5W 0.8-5 MG/ML-% IV SOLN
10.0000 mg/h | INTRAVENOUS | Status: DC
  Filled 2013-10-29: qty 400

## 2013-10-29 MED ORDER — CEFAZOLIN SODIUM-DEXTROSE 2-3 GM-% IV SOLR
2.0000 g | Freq: Once | INTRAVENOUS | Status: AC
  Administered 2013-10-29: 2 g via INTRAVENOUS
  Filled 2013-10-29: qty 50

## 2013-10-29 MED ORDER — PROPOFOL 10 MG/ML IV BOLUS
INTRAVENOUS | Status: AC
  Filled 2013-10-29: qty 20

## 2013-10-29 MED ORDER — ONDANSETRON HCL 4 MG/2ML IJ SOLN: 4.0000 mg | Freq: Four times a day (QID) | INTRAMUSCULAR | Status: DC | PRN

## 2013-10-29 MED ORDER — ARTIFICIAL TEARS OP OINT
TOPICAL_OINTMENT | OPHTHALMIC | Status: DC | PRN
  Administered 2013-10-29: 1 via OPHTHALMIC

## 2013-10-29 MED ORDER — ARTIFICIAL TEARS OP OINT
TOPICAL_OINTMENT | OPHTHALMIC | Status: AC
  Filled 2013-10-29: qty 3.5

## 2013-10-29 MED ORDER — ALBUTEROL SULFATE (2.5 MG/3ML) 0.083% IN NEBU
2.5000 mg | INHALATION_SOLUTION | RESPIRATORY_TRACT | Status: AC
  Administered 2013-10-29: 2.5 mg via RESPIRATORY_TRACT

## 2013-10-29 MED ORDER — ONDANSETRON HCL 4 MG/2ML IJ SOLN
INTRAMUSCULAR | Status: AC
  Filled 2013-10-29: qty 2

## 2013-10-29 MED ORDER — SUCCINYLCHOLINE CHLORIDE 20 MG/ML IJ SOLN
INTRAMUSCULAR | Status: DC | PRN
Start: 2013-10-29 — End: 2013-10-29
  Administered 2013-10-29: 100 mg via INTRAVENOUS

## 2013-10-29 MED ORDER — PHENYLEPHRINE HCL 10 MG/ML IJ SOLN
INTRAMUSCULAR | Status: DC | PRN
  Administered 2013-10-29 (×2): 120 ug via INTRAVENOUS
  Administered 2013-10-29 (×2): 80 ug via INTRAVENOUS

## 2013-10-29 SURGICAL SUPPLY — 46 items
19 FR. ROUND HUBLESS FULL FLUTED ×2 IMPLANT
CANISTER SUCTION 2500CC (MISCELLANEOUS) ×3 IMPLANT
CHLORAPREP W/TINT 26ML (MISCELLANEOUS) ×3 IMPLANT
COVER SURGICAL LIGHT HANDLE (MISCELLANEOUS) ×3 IMPLANT
DRAPE LAPAROSCOPIC ABDOMINAL (DRAPES) ×3 IMPLANT
DRAPE UTILITY 15X26 W/TAPE STR (DRAPE) ×6 IMPLANT
ELECT CAUTERY BLADE 6.4 (BLADE) ×3 IMPLANT
ELECT REM PT RETURN 9FT ADLT (ELECTROSURGICAL) ×3
ELECTRODE REM PT RTRN 9FT ADLT (ELECTROSURGICAL) ×1 IMPLANT
EVACUATOR SILICONE 100CC (DRAIN) ×2 IMPLANT
GLOVE BIO SURGEON STRL SZ7 (GLOVE) ×2 IMPLANT
GLOVE BIO SURGEON STRL SZ8 (GLOVE) ×3 IMPLANT
GLOVE BIOGEL PI IND STRL 7.0 (GLOVE) IMPLANT
GLOVE BIOGEL PI IND STRL 7.5 (GLOVE) IMPLANT
GLOVE BIOGEL PI IND STRL 8 (GLOVE) ×1 IMPLANT
GLOVE BIOGEL PI INDICATOR 7.0 (GLOVE) ×4
GLOVE BIOGEL PI INDICATOR 7.5 (GLOVE) ×2
GLOVE BIOGEL PI INDICATOR 8 (GLOVE) ×2
GLOVE SURG SS PI 7.0 STRL IVOR (GLOVE) ×4 IMPLANT
GOWN STRL REUS W/ TWL LRG LVL3 (GOWN DISPOSABLE) ×2 IMPLANT
GOWN STRL REUS W/ TWL XL LVL3 (GOWN DISPOSABLE) ×1 IMPLANT
GOWN STRL REUS W/TWL LRG LVL3 (GOWN DISPOSABLE) ×9
GOWN STRL REUS W/TWL XL LVL3 (GOWN DISPOSABLE) ×3
KIT BASIN OR (CUSTOM PROCEDURE TRAY) ×3 IMPLANT
KIT ROOM TURNOVER OR (KITS) ×3 IMPLANT
MARKER SKIN DUAL TIP RULER LAB (MISCELLANEOUS) ×2 IMPLANT
MESH VENT ST 13.8X17.8CM L OVL (Mesh General) ×2 IMPLANT
NEEDLE 22X1 1/2 (OR ONLY) (NEEDLE) ×3 IMPLANT
NS IRRIG 1000ML POUR BTL (IV SOLUTION) ×3 IMPLANT
PACK GENERAL/GYN (CUSTOM PROCEDURE TRAY) ×3 IMPLANT
PAD ARMBOARD 7.5X6 YLW CONV (MISCELLANEOUS) ×3 IMPLANT
PENCIL BUTTON HOLSTER BLD 10FT (ELECTRODE) ×2 IMPLANT
RETAINER VISCERA MED (MISCELLANEOUS) ×2 IMPLANT
SPONGE LAP 18X18 X RAY DECT (DISPOSABLE) ×2 IMPLANT
STAPLER VISISTAT 35W (STAPLE) ×2 IMPLANT
SUT ETHILON 2 0 FS 18 (SUTURE) ×2 IMPLANT
SUT MNCRL AB 4-0 PS2 18 (SUTURE) ×3 IMPLANT
SUT NOVA 1 T20/GS 25DT (SUTURE) ×6 IMPLANT
SUT SILK 2 0 (SUTURE) ×3
SUT SILK 2-0 18XBRD TIE 12 (SUTURE) IMPLANT
SUT VIC AB 3-0 SH 27 (SUTURE) ×3
SUT VIC AB 3-0 SH 27X BRD (SUTURE) ×1 IMPLANT
SYR CONTROL 10ML LL (SYRINGE) ×3 IMPLANT
TAPE CLOTH SURG 6X10 WHT LF (GAUZE/BANDAGES/DRESSINGS) ×2 IMPLANT
TOWEL OR 17X24 6PK STRL BLUE (TOWEL DISPOSABLE) ×3 IMPLANT
TOWEL OR 17X26 10 PK STRL BLUE (TOWEL DISPOSABLE) ×3 IMPLANT

## 2013-10-29 NOTE — Anesthesia Procedure Notes (Signed)
Procedure Name: Intubation Date/Time: 10/29/2013 2:06 PM Performed by: Sampson Si E Pre-anesthesia Checklist: Patient identified, Emergency Drugs available, Suction available, Patient being monitored and Timeout performed Patient Re-evaluated:Patient Re-evaluated prior to inductionOxygen Delivery Method: Circle system utilized Preoxygenation: Pre-oxygenation with 100% oxygen Intubation Type: IV induction Ventilation: Mask ventilation without difficulty Laryngoscope Size: Mac and 3 Grade View: Grade I Tube type: Oral Tube size: 7.5 mm Number of attempts: 1 Airway Equipment and Method: Stylet Placement Confirmation: ETT inserted through vocal cords under direct vision,  positive ETCO2 and breath sounds checked- equal and bilateral Secured at: 20 cm Tube secured with: Tape Dental Injury: Teeth and Oropharynx as per pre-operative assessment

## 2013-10-29 NOTE — Anesthesia Preprocedure Evaluation (Addendum)
Anesthesia Evaluation  Patient identified by MRN, date of birth, ID band Patient awake    Reviewed: Allergy & Precautions, H&P , NPO status , Patient's Chart, lab work & pertinent test results, reviewed documented beta blocker date and time   Airway Mallampati: III TM Distance: <3 FB Neck ROM: Full    Dental  (+) Edentulous Upper, Partial Lower, Dental Advisory Given   Pulmonary former smoker,    + wheezing      Cardiovascular hypertension, Pt. on home beta blockers and Pt. on medications Rhythm:Regular     Neuro/Psych    GI/Hepatic GERD-  Medicated,  Endo/Other  Hypothyroidism   Renal/GU      Musculoskeletal   Abdominal (+) + obese,   Peds  Hematology   Anesthesia Other Findings Nebulizer RX will be given.  Not tachypnic,  Sat 98% on 2 liters  Reproductive/Obstetrics                        Anesthesia Physical Anesthesia Plan  ASA: III  Anesthesia Plan: General   Post-op Pain Management:    Induction: Intravenous  Airway Management Planned: Oral ETT  Additional Equipment:   Intra-op Plan:   Post-operative Plan: Possible Post-op intubation/ventilation  Informed Consent:   Plan Discussed with:   Anesthesia Plan Comments:         Anesthesia Quick Evaluation

## 2013-10-29 NOTE — Progress Notes (Signed)
  Subjective: Some pain at hernia site. Nothing PO since yesterday.  Objective: Vital signs in last 24 hours: Temp:  [97.9 F (36.6 C)-98.1 F (36.7 C)] 97.9 F (36.6 C) (08/25 0654) Pulse Rate:  [72-79] 79 (08/25 0907) Resp:  [19-22] 20 (08/25 0907) BP: (117-148)/(56-74) 138/56 mmHg (08/25 0654) SpO2:  [93 %-99 %] 98 % (08/25 0907) Last BM Date: 10/28/13  Intake/Output from previous day: 08/24 0701 - 08/25 0700 In: 35 [P.O.:238] Out: 750 [Urine:750] Intake/Output this shift:    General appearance: alert and cooperative Resp: exp wheeze B Cardio: regular rate and rhythm GI: soft, incisional hernia reduces but spontaneously recurs  Lab Results:   Recent Labs  10/28/13 0534 10/29/13 0725  WBC 7.3 5.4  HGB 10.6* 11.2*  HCT 32.6* 34.9*  PLT 220 244   BMET  Recent Labs  10/28/13 0534 10/29/13 0725  NA 135* 136*  K 3.3* 3.5*  CL 100 101  CO2 23 24  GLUCOSE 135* 144*  BUN 49* 20  CREATININE 1.40* 0.81  CALCIUM 7.9* 8.0*   PT/INR No results found for this basename: LABPROT, INR,  in the last 72 hours ABG No results found for this basename: PHART, PCO2, PO2, HCO3,  in the last 72 hours  Studies/Results: Dg Chest Port 1 View  10/27/2013   CLINICAL DATA:  Shortness of breath.  EXAM: PORTABLE CHEST - 1 VIEW  COMPARISON:  10/25/2013  FINDINGS: Shallow inspiration with elevation of right and left hemidiaphragms. Atelectasis in both lung bases. Heart size and pulmonary vascularity appear normal. No pneumothorax. Calcification of the aorta. Degenerative changes in the right shoulder and spine.  IMPRESSION: Shallow inspiration with atelectasis in the lung bases similar to previous study.   Electronically Signed   By: Lucienne Capers M.D.   On: 10/27/2013 23:30    Anti-infectives: Anti-infectives   Start     Dose/Rate Route Frequency Ordered Stop   10/29/13 1300  ceFAZolin (ANCEF) IVPB 2 g/50 mL premix     2 g 100 mL/hr over 30 Minutes Intravenous  Once 10/29/13  2979        Assessment/Plan: Recurrent incisional hernia - CRT better so will proceed with repair with mesh. Procedure, risks, benefits D/W her and her daughters. She agrees. AKI - resolved Resp failure/atelectasis - BDs and pulmonary toilet VTE - hold lovenox for OR   LOS: 4 days    Parish Augustine E 10/29/2013

## 2013-10-29 NOTE — Transfer of Care (Signed)
Immediate Anesthesia Transfer of Care Note  Patient: [name]  Procedure(s) Performed: Procedure(s): OPEN REPAIR OF RECURRENT INCISIONAL HERNIA WITH MESH (N/A)  Patient Location: PACU  Anesthesia Type:General  Level of Consciousness: awake and alert   Airway & Oxygen Therapy: Patient Spontanous Breathing, non-rebreather face mask and RT arrived to place patient on BiPap  Post-op Assessment: Report given to PACU RN and Post -op Vital signs reviewed and unstable, Anesthesiologist notified  Post vital signs: Reviewed  Complications: respiratory complications

## 2013-10-29 NOTE — Op Note (Signed)
10/25/2013 - 10/29/2013  3:25 PM  PATIENT:  [name]  78 y.o. female  PRE-OPERATIVE DIAGNOSIS:  RECURRENT INCISIONAL HERNIA  POST-OPERATIVE DIAGNOSIS:  RECURRENT INCISIONAL HERNIA  PROCEDURE:  Procedure(s): OPEN REPAIR OF RECURRENT INCISIONAL HERNIA WITH MESH  SURGEON:  Georganna Skeans, M.D.  ASSISTANTS: Donnie Mesa, MD   ANESTHESIA:   general  EBL:  Total I/O In: 500 [I.V.:500] Out: -   BLOOD ADMINISTERED:none  DRAINS: (sub cut) Jackson-Pratt drain(s) with closed bulb suction in the sub cut   SPECIMEN:  No Specimen  DISPOSITION OF SPECIMEN:  N/A  COUNTS:  YES  DICTATION: .Dragon Dictation She is brought for repair of recurrent incisional hernia with mesh. She was identified in the preop holding area. Informed consent was obtained. She received intravenous antibiotics. She was brought to the operating room and general endotracheal anesthesia was administered by the anesthesia staff. Her abdomen was prepped and draped in sterile fashion. Time out procedure was performed. Midline incision was made along her old scar from above the palpable hernia to below the palpable hernia. Subcutaneous tissues were dissected down revealing the 2 hernia sacs. One contained the transverse colon. The hernia sac was carefully excised and the colon was freed up from the fascia circumferentially. The additional hernia defect was located. The fascia was opened to connect the 2 defects. This hernia sac only contained omentum. This was carefully freed up from the fascia and hemostasis was obtained. This was reduced back into the abdomen. The cord was reduced back into the abdomen. The fascia was circumferentially cleared off to facilitate repair. Next a Ventrio mesh was chosen of appropriate size. This was secured to the fascia in an in lay fashion with interrupted #1 Novafil sutures. This allowed several centimeters of underlay. All the sutures were tied in the mesh laid nicely. The fascia was then closed  over the top of the mesh with interrupted figure-of-eight #1 Novafil sutures. [place] drain was placed in the subcutaneous tissues. Hemostasis was ensured. Subcutaneous tissues were closed with running 3-0 Vicryl. Drain stitch was applied. Skin was closed with staples. All counts were correct. Patient tolerated the procedure well without apparent complication and was taken recovery in stable condition.  PATIENT DISPOSITION:  PACU - hemodynamically stable.   Delay start of Pharmacological VTE agent (>24hrs) due to surgical blood loss or risk of bleeding:  no  Georganna Skeans, MD, MPH, FACS Pager: [phone]  8/25/20153:25 PM

## 2013-10-29 NOTE — Progress Notes (Signed)
Called to PACU to initiate BiPAP on Pt. Pt was desaturating and had been placed on non-rebreather.  Pt stablized with good volume return. Dr. Tobias Alexander came to evaluate Pt.  No new RT orders presently.

## 2013-10-30 MED ORDER — SODIUM CHLORIDE 0.9 % IV BOLUS (SEPSIS)
500.0000 mL | Freq: Once | INTRAVENOUS | Status: AC
  Administered 2013-10-30: 500 mL via INTRAVENOUS

## 2013-10-30 MED ORDER — OXYCODONE HCL 5 MG PO TABS: 5.0000 mg | ORAL_TABLET | ORAL | Status: DC | PRN

## 2013-10-30 MED ORDER — ALBUTEROL SULFATE (2.5 MG/3ML) 0.083% IN NEBU
2.5000 mg | INHALATION_SOLUTION | Freq: Three times a day (TID) | RESPIRATORY_TRACT | Status: DC
  Administered 2013-10-30 – 2013-11-04 (×16): 2.5 mg via RESPIRATORY_TRACT
  Filled 2013-10-30 (×16): qty 3

## 2013-10-30 MED ORDER — ENOXAPARIN SODIUM 40 MG/0.4ML ~~LOC~~ SOLN
40.0000 mg | SUBCUTANEOUS | Status: DC
  Administered 2013-10-30 – 2013-11-04 (×6): 40 mg via SUBCUTANEOUS
  Filled 2013-10-30 (×6): qty 0.4

## 2013-10-30 NOTE — Progress Notes (Signed)
After 500cc bolus, patient had 156ml urine output.  Bladder scanned again for 14ml. Dr. Redmond Pulling notified and gave no further orders. RN will continue to monitor patient.

## 2013-10-30 NOTE — Progress Notes (Signed)
Patient with limited urinary output today.  Voided 100 @ 0854.  Patient was bladder scanned again at 1515 with a result of 112mL.  No bladder distention noted. Patient states she has no urge to go.  Dr. Grandville Silos notified and gave orders for 595mL NS bolus.  Will continue to monitor patient.

## 2013-10-30 NOTE — Progress Notes (Signed)
Pt has not void since 2300 10/29/13. Bladder scan pt @ 0713, scan=135. Pt's bladder is not distend and Pt stated that they didn't feel as if they had to void. RN will continue to monitor pt.

## 2013-10-30 NOTE — Evaluation (Signed)
Physical Therapy Evaluation Patient Details Name: [name] MRN: [id number] DOB: 1936-01-29 Today's Date: 10/30/2013   History of Present Illness  [name] is a 78 year old female with a history of HTN, obesity, hypothyroidism, breast cancer, s/p lumpectomy, abdominal hysterectomy, hernia repair, GERD and asthma.  She presents to [facility] with abdominal pain.  Duration of symptoms is 2 days. Onset was Wednesday afternoon after eating lunch.  Time pattern is constant.  Severe in severity. Characterized as "it just hurts."  No aggravating or alleviating factors.  Modifying factors include; massage and Advil.  She reports calling EMS yesterday who stated she had a GI bug, a temp of 102 and recommended she take Advil.  Associated symptoms include; nausea, she vomited today after coming to the ED.  She had a bowel movement this morning.  She denies recent diarrhea, constipation, weight loss or weight gain. She reports intermittent abdominal pain over the last few months. A CT of abdomen and pelvis showed a bowel obstruction due to incarcerated ventral hernia, 2 herniations are noted involving the transverse colon, no free air.  Normal lactic acid.  White count of 15.2K.     Clinical Impression  Pt presents with overall weakness and increased dyspnea at rest and with activity with heavy expiratory wheezing noted.  Pt did take two puffs on personal inhaler with RN present and provided very minimal relief.  Pt to get neb treatment following session due to missed one at 2 pm.  Feel pt will do well once breathing is under control.  Plan is for home with daughter assisting for one week.  Will continue to assess pts mobility and safety, if pt does not progress may need ST SNF.  Also feel she could benefit from hospital bed at time of D/C.      Follow Up Recommendations Home health PT;SNF;Supervision/Assistance - 24 hour    Equipment Recommendations  Rolling walker with 5" wheels;Hospital bed (bari RW, may benefit  from hospital bed)    Recommendations for Other Services OT consult     Precautions / Restrictions Precautions Precautions: Fall Precaution Comments: hernia repair with JP drain, monitor sats Restrictions Weight Bearing Restrictions: No      Mobility  Bed Mobility Overal bed mobility: Needs Assistance Bed Mobility: Supine to Sit;Sit to Supine     Supine to sit: Min assist;HOB elevated Sit to supine: Min assist   General bed mobility comments: Pt able to use bedrails to get to EOB with min A for steadying of trunk and scooting to EOB.  Pt with good mobility considering pain level and increased body habitus.   Transfers Overall transfer level: Needs assistance Equipment used: Rolling walker (2 wheeled) Transfers: Sit to/from [facility] Sit to Stand: Mod assist Stand pivot transfers: Min assist       General transfer comment: Pt requires mod A to stand from lower bed surface, however did better from raised 3in1.  Cues for sequencing/technqiue with RW and ensuring continued pursed lip breathing.    Ambulation/Gait Ambulation/Gait assistance:  (deferred due to increased dyspnea)              Stairs            Wheelchair Mobility    Modified Rankin (Stroke Patients Only)       Balance Overall balance assessment: Needs assistance Sitting-balance support: Bilateral upper extremity supported;Feet supported Sitting balance-Leahy Scale: Fair     Standing balance support: During functional activity;Bilateral upper extremity supported Standing balance-Leahy  Scale: Fair                               Pertinent Vitals/Pain Pain Assessment: 0-10 Pain Score: 4  Pain Descriptors / Indicators: Aching Pain Intervention(s): PCA encouraged    Home Living Family/patient expects to be discharged to:: Private residence Living Arrangements: Children Available Help at Discharge: Family (daughter staying for one week) Type of Home:  House Home Access: Stairs to enter Entrance Stairs-Rails: None Technical brewer of Steps: 4 Home Layout: One level Home Equipment: Cane - single point      Prior Function Level of Independence: Independent with assistive device(s)         Comments: with use of cane     Hand Dominance        Extremity/Trunk Assessment               Lower Extremity Assessment: Generalized weakness      Cervical / Trunk Assessment: Kyphotic  Communication      Cognition Arousal/Alertness: Lethargic Behavior During Therapy: WFL for tasks assessed/performed Overall Cognitive Status: Within Functional Limits for tasks assessed                      General Comments General comments (skin integrity, edema, etc.): Note pt VERY wheezy with all mobility and very dyspneic, therefore did not attempt ambulation on day of eval.     Exercises        Assessment/Plan    PT Assessment Patient needs continued PT services  PT Diagnosis Difficulty walking;Generalized weakness;Acute pain   PT Problem List Decreased strength;Decreased activity tolerance;Decreased balance;Decreased mobility;Decreased knowledge of use of DME;Decreased safety awareness;Cardiopulmonary status limiting activity;Obesity  PT Treatment Interventions DME instruction;Gait training;Stair training;Functional mobility training;Therapeutic activities;Therapeutic exercise;Balance training;Patient/family education   PT Goals (Current goals can be found in the Care Plan section) Acute Rehab PT Goals Patient Stated Goal: to get stronger PT Goal Formulation: With patient Time For Goal Achievement: 11/13/13 Potential to Achieve Goals: Good    Frequency Min 3X/week   Barriers to discharge   daughter to stay with pt for one week.     Co-evaluation               End of Session Equipment Utilized During Treatment: Oxygen Activity Tolerance: Patient limited by fatigue;Patient limited by lethargy;Patient  limited by pain Patient left: in bed;with call bell/phone within reach Nurse Communication: Mobility status         Time: 3295-1884 PT Time Calculation (min): 38 min   Charges:   PT Evaluation $Initial PT Evaluation Tier I: 1 Procedure PT Treatments $Therapeutic Activity: 23-37 mins   PT G Codes:          Tylena, Prisk 10/30/2013, 4:42 PM

## 2013-10-30 NOTE — Progress Notes (Signed)
1 Day Post-Op  Subjective: Pain well controlled  Objective: Vital signs in last 24 hours: Temp:  [97.5 F (36.4 C)-98.5 F (36.9 C)] 98.2 F (36.8 C) (08/26 0604) Pulse Rate:  [76-103] 94 (08/26 0604) Resp:  [14-29] 28 (08/26 0604) BP: (101-180)/(55-85) 101/55 mmHg (08/26 0604) SpO2:  [4 %-100 %] 93 % (08/26 0604) FiO2 (%):  [80 %] 80 % (08/25 1700) Last BM Date: 10/29/13  Intake/Output from previous day: 08/25 0701 - 08/26 0700 In: 10511 [I.V.:10511] Out: 620 [Urine:400; Drains:220] Intake/Output this shift:    General appearance: alert and cooperative Resp: some exp wheeze B Cardio: regular rate and rhythm GI: soft, dry stain on dressing, JP SS  Lab Results:   Recent Labs  10/28/13 0534 10/29/13 0725  WBC 7.3 5.4  HGB 10.6* 11.2*  HCT 32.6* 34.9*  PLT 220 244   BMET  Recent Labs  10/28/13 0534 10/29/13 0725  NA 135* 136*  K 3.3* 3.5*  CL 100 101  CO2 23 24  GLUCOSE 135* 144*  BUN 49* 20  CREATININE 1.40* 0.81  CALCIUM 7.9* 8.0*   PT/INR No results found for this basename: LABPROT, INR,  in the last 72 hours ABG No results found for this basename: PHART, PCO2, PO2, HCO3,  in the last 72 hours  Studies/Results: No results found.  Anti-infectives: Anti-infectives   Start     Dose/Rate Route Frequency Ordered Stop   10/29/13 1300  [MAR Hold]  ceFAZolin (ANCEF) IVPB 2 g/50 mL premix     (On MAR Hold since 10/29/13 1252)   2 g 100 mL/hr over 30 Minutes Intravenous  Once 10/29/13 0910 10/29/13 1408      Assessment/Plan: S/P open repair recurrent incisional hernia with mesh - POD#1 [place] - PT/OT FEN - clears, add oral pain meds Resp failure/ATX - continue pulmonary toilet and BDs VTE - Lovenox  LOS: 5 days    Armetta Henri E 10/30/2013

## 2013-10-31 DIAGNOSIS — N289 Disorder of kidney and ureter, unspecified: Secondary | ICD-10-CM

## 2013-10-31 LAB — BASIC METABOLIC PANEL
ANION GAP: 11 (ref 5–15)
Anion gap: 14 (ref 5–15)
BUN: 23 mg/dL (ref 6–23)
BUN: 26 mg/dL — AB (ref 6–23)
CALCIUM: 7.9 mg/dL — AB (ref 8.4–10.5)
CHLORIDE: 99 meq/L (ref 96–112)
CO2: 22 meq/L (ref 19–32)
CO2: 18 meq/L — AB (ref 19–32)
CREATININE: 2.06 mg/dL — AB (ref 0.50–1.10)
CREATININE: 2.05 mg/dL — AB (ref 0.50–1.10)
Calcium: 8 mg/dL — ABNORMAL LOW (ref 8.4–10.5)
Chloride: 102 mEq/L (ref 96–112)
GFR calc Af Amer: 26 mL/min — ABNORMAL LOW (ref >90)
GFR calc Af Amer: 26 mL/min — ABNORMAL LOW (ref >90)
GFR calc non Af Amer: 22 mL/min — ABNORMAL LOW (ref >90)
GFR calc non Af Amer: 23 mL/min — ABNORMAL LOW (ref >90)
GLUCOSE: 123 mg/dL — AB (ref 70–99)
Glucose, Bld: 140 mg/dL — ABNORMAL HIGH (ref 70–99)
Potassium: 4.5 mEq/L (ref 3.7–5.3)
Potassium: 4.4 mEq/L (ref 3.7–5.3)
Sodium: 135 mEq/L — ABNORMAL LOW (ref 137–147)
Sodium: 131 mEq/L — ABNORMAL LOW (ref 137–147)

## 2013-10-31 LAB — URINALYSIS, ROUTINE W REFLEX MICROSCOPIC
GLUCOSE, UA: NEGATIVE mg/dL
KETONES UR: 15 mg/dL — AB
Nitrite: NEGATIVE
PH: 5.5 (ref 5.0–8.0)
Protein, ur: 30 mg/dL — AB
Specific Gravity, Urine: 1.019 (ref 1.005–1.030)
Urobilinogen, UA: 1 mg/dL (ref 0.0–1.0)

## 2013-10-31 LAB — URINE MICROSCOPIC-ADD ON

## 2013-10-31 MED ORDER — MORPHINE SULFATE 2 MG/ML IJ SOLN
2.0000 mg | INTRAMUSCULAR | Status: DC | PRN
  Administered 2013-11-02: 2 mg via INTRAVENOUS
  Filled 2013-10-31: qty 1

## 2013-10-31 MED ORDER — SODIUM CHLORIDE 0.9 % IV BOLUS (SEPSIS)
500.0000 mL | Freq: Once | INTRAVENOUS | Status: AC
  Administered 2013-10-31: 500 mL via INTRAVENOUS

## 2013-10-31 MED ORDER — GUAIFENESIN ER 600 MG PO TB12
600.0000 mg | ORAL_TABLET | Freq: Two times a day (BID) | ORAL | Status: DC
  Administered 2013-10-31 – 2013-11-04 (×8): 600 mg via ORAL
  Filled 2013-10-31 (×10): qty 1

## 2013-10-31 MED ORDER — PANTOPRAZOLE SODIUM 40 MG PO TBEC
40.0000 mg | DELAYED_RELEASE_TABLET | Freq: Every day | ORAL | Status: DC
  Administered 2013-10-31 – 2013-11-04 (×5): 40 mg via ORAL
  Filled 2013-10-31 (×5): qty 1

## 2013-10-31 MED ORDER — OXYCODONE HCL 5 MG PO TABS
10.0000 mg | ORAL_TABLET | ORAL | Status: DC | PRN
  Administered 2013-10-31: 20 mg via ORAL
  Administered 2013-10-31: 10 mg via ORAL
  Filled 2013-10-31: qty 4
  Filled 2013-10-31: qty 2

## 2013-10-31 MED ORDER — SODIUM CHLORIDE 0.9 % IV SOLN
INTRAVENOUS | Status: AC
  Administered 2013-10-31 – 2013-11-01 (×4): via INTRAVENOUS

## 2013-10-31 NOTE — Progress Notes (Signed)
Patient ID: [name], female   DOB: 06-Jun-1935, 78 y.o.   MRN: [id number]     [facility]      [address] [place], [place] [address]    Phone: [phone] FAX: [phone]     Subjective: Drowsy.  No sob, cp.  Pain is okay.  Has not voided in 12 hours.  Passing flatus.  Getting up to chair.    Objective:  Vital signs:  Filed Vitals:   10/30/13 2259 10/31/13 0226 10/31/13 0407 10/31/13 0613  BP:  119/64  96/59  Pulse:  78  86  Temp:  98.6 F (37 C)  98.4 F (36.9 C)  TempSrc:      Resp: 21 19 21 19   Height:      Weight:      SpO2: 93% 95% 92% 94%    Last BM Date: 10/29/13  Intake/Output   Yesterday:  08/26 0701 - 08/27 0700 In: 2410.8 [P.O.:1680; I.V.:730.8] Out: 400 [Urine:250; Drains:150] This shift: I/O last 3 completed shifts: In: 12121.8 [P.O.:1680; I.V.:10441.8] Out: 26 [Urine:650; Drains:230]    Physical Exam: General: Pt awake/alert/oriented x4 in no acute distress.  Drowsy.  Chest: cta.  No chest wall pain w good excursion CV:  Pulses intact.  Regular rhythm Abdomen: Soft.  Nondistended.   Mildly tender at incisions only.  Midline incision-dressing removed, staples are intact, RLQ drain with serosanguinous output.  No evidence of peritonitis.  No incarcerated hernias. Ext: No mjr edema.  No cyanosis Skin: No petechiae / purpura   Problem List:   Active Problems:   Colonic obstruction    Results:   Labs: Results for orders placed during the hospital encounter of 10/25/13 (from the past 48 hour(s))  SURGICAL PCR SCREEN     Status: None   Collection Time    10/29/13 12:19 PM      Result Value Ref Range   MRSA, PCR NEGATIVE  NEGATIVE   Staphylococcus aureus NEGATIVE  NEGATIVE   Comment:            The Xpert SA Assay (FDA     approved for NASAL specimens     in patients over 35 years of age),     is one component of     a comprehensive surveillance     program.  Test performance has    been validated by [employer] for patients greater     than or equal to 69 year old.     It is not intended     to diagnose infection nor to     guide or monitor treatment.  BASIC METABOLIC PANEL     Status: Abnormal   Collection Time    10/31/13  5:49 AM      Result Value Ref Range   Sodium 135 (*) 137 - 147 mEq/L   Potassium 4.5  3.7 - 5.3 mEq/L   Comment: DELTA CHECK NOTED   Chloride 102  96 - 112 mEq/L   CO2 22  19 - 32 mEq/L   Glucose, Bld 140 (*) 70 - 99 mg/dL   BUN 23  6 - 23 mg/dL   Creatinine, Ser 2.06 (*) 0.50 - 1.10 mg/dL   Comment: DELTA CHECK NOTED   Calcium 7.9 (*) 8.4 - 10.5 mg/dL   GFR calc non Af Amer 22 (*) >90 mL/min   GFR calc Af Amer 26 (*) >90 mL/min   Comment: (NOTE)  The eGFR has been calculated using the CKD EPI equation.     This calculation has not been validated in all clinical situations.     eGFR's persistently <90 mL/min signify possible Chronic Kidney     Disease.   Anion gap 11  5 - 15    Imaging / Studies: No results found.  Medications / Allergies:  Scheduled Meds: . albuterol  2.5 mg Nebulization TID  . amLODipine  5 mg Oral Daily  . antiseptic oral rinse  7 mL Mouth Rinse BID  . carvedilol  6.25 mg Oral BID WC  . enoxaparin (LOVENOX) injection  40 mg Subcutaneous Q24H  . pantoprazole (PROTONIX) IV  40 mg Intravenous QHS  . sodium chloride  500 mL Intravenous Once   Continuous Infusions: . dextrose 5 % and 0.9 % NaCl with KCl 20 mEq/L 75 mL/hr (10/31/13 0217)   PRN Meds:.acetaminophen, acetaminophen, albuterol, hydrALAZINE, morphine injection, ondansetron, oxyCODONE  Antibiotics: Anti-infectives   Start     Dose/Rate Route Frequency Ordered Stop   10/29/13 1300  [MAR Hold]  ceFAZolin (ANCEF) IVPB 2 g/50 mL premix     (On MAR Hold since 10/29/13 1252)   2 g 100 mL/hr over 30 Minutes Intravenous  Once 10/29/13 0910 10/29/13 1408       Assessment/Plan S/P open repair recurrent incisional hernia with mesh---Dr.  Grandville Silos POD#2 -continue drain care -mobilize [place] - PT/OT AKI-2.06 today, BP soft,  Stop hctz/lisinopril.  Give 500cc fluid bolus, foley for I&Os, UA FEN - fulls today, DC PCA and increase oral pain meds Resp failure/ATX - continue pulmonary toilet and BDs (250 only on IS) VTE - Lovenox   Erby Pian, ANP-BC [facility] Pager [phone](7A-4:30P) For consults and floor pages call [phone](7A-4:30P)  10/31/2013 7:51 AM

## 2013-10-31 NOTE — Anesthesia Postprocedure Evaluation (Signed)
Anesthesia Post Note  Patient: [name]  Procedure(s) Performed: Procedure(s) (LRB): OPEN REPAIR OF RECURRENT INCISIONAL HERNIA WITH MESH (N/A)  Anesthesia type: general  Patient location: PACU  Post pain: Pain level controlled  Post assessment: Patient's Cardiovascular Status Stable  Post vital signs: Reviewed and stable  Level of consciousness: sedated  Complications: No apparent anesthesia complications

## 2013-10-31 NOTE — Clinical Social Work Psychosocial (Signed)
Clinical Social Work Department BRIEF PSYCHOSOCIAL ASSESSMENT 10/31/2013  Patient:  [name], [name]     Account Number:  [id number]     [place] date:  10/25/2013  Clinical Social Worker:  Domenica Reamer, [place]  Date/Time:  10/31/2013 10:32 AM  Referred by:  Physician  Date Referred:  10/31/2013 Referred for  SNF Placement   Other Referral:   Interview type:  Patient Other interview type:   none    PSYCHOSOCIAL DATA Living Status:  ALONE Admitted from facility:   Level of care:   Primary support name:  Aneta Hendershott Primary support relationship to patient:  CHILD, ADULT Degree of support available:   Patient reported having high level of support from her sons who live in the [place] area.    CURRENT CONCERNS Current Concerns  Post-Acute Placement   Other Concerns:   none    SOCIAL WORK ASSESSMENT / PLAN CSW spoke with patient about potential SNF placement. Patient was agreeable to SNF.  CSW explained SNF process and patient expressed desire to be placed in [place] near her family. Patient reported living alone but having the help of her adult sons who live in the [place] area. CSW will continue to follow.   Assessment/plan status:  Psychosocial Support/Ongoing Assessment of Needs Other assessment/ plan:   FL2, PASAR   Information/referral to community resources:   [facility]    PATIENT'S/FAMILY'S RESPONSE TO PLAN OF CARE: Patient was agreeable to SNF and was optimistic about SNF helping her get back to independent living.       Domenica Reamer, [place] Social Worker [phone]

## 2013-10-31 NOTE — Progress Notes (Signed)
Patient seen and examined.  Has acute renal insufficiency.  Will recheck BMET this afternoon.

## 2013-10-31 NOTE — Progress Notes (Signed)
Pt has made several attempts to void using BSC with no results bladder scan this AM only shows 30 ml in bladder Pt states she feels the urge and pain in bladder. Awaiting for MD response. Arthor Captain LPN

## 2013-10-31 NOTE — Progress Notes (Signed)
Occupational Therapy Evaluation Patient Details Name: [name] MRN: [id number] DOB: April 23, 1935 Today's Date: 10/31/2013    History of Present Illness [name] is a 78 y.o. Female with hx of HTN, obesity, hypothyroidism, breast cancer s/p lumpectomy, abdominal hysterectomy, hernia repair, GERD, and asthma. Pt admitted 10/25/13 with abdominal pain. CT of abdomen and pelvis showed bowel obstruction due to incarcerated ventral hernia. Pt s/p open repair of recurrent incisional hernia on 10/29/13.    Clinical Impression   PTA pt lived at home and was independent with use of [facility] for ADLs and functional mobility. Pt limited by fatigue and lethargy and pt's family reports that she recently received pain medication. Pt requires (A) for ADLs due to lethargy at this time, however per PT note was ambulating with Min (A) earlier this date. Pt is severely deconditioned and fatigues with EOB sitting. Pt would benefit from SNF to increase strength and endurance for ADLs prior to return home. Pt would benefit from continued skilled OT to increase UE strength and promote functional mobility.     Follow Up Recommendations  SNF;Supervision/Assistance - 24 hour    Equipment Recommendations  3 in 1 bedside comode;Tub/shower bench    Recommendations for Other Services       Precautions / Restrictions Precautions Precautions: Fall Precaution Comments: hernia repair with JP drain, monitor sats Restrictions Weight Bearing Restrictions: No      Mobility Bed Mobility Overal bed mobility: Needs Assistance Bed Mobility: Supine to Sit;Sit to Supine     Supine to sit: Min assist;HOB elevated Sit to supine: Mod assist;HOB elevated   General bed mobility comments: Pt required increased assist to return to bed, due to fatigue and lethargy. Pt was able to use bed rails to pull to sitting at EOB with min (A) for truncal support to elevate off bed.   Transfers         General transfer comment: Pt declined  transfer despite max encouragement and education on benefit of mobilization. Family concerned that pt was becoming fatigued quickly.     Balance Overall balance assessment: Needs assistance Sitting-balance support: Bilateral upper extremity supported;Feet supported Sitting balance-Leahy Scale: Poor Sitting balance - Comments: required bil UE support to prevent from LOB posteriorly. Likely due to lethargy/fatigue                             ADL Overall ADL's : Needs assistance/impaired Eating/Feeding: Independent;Bed level   Grooming: Minimal assistance;Bed level   Upper Body Bathing: Moderate assistance;Bed level   Lower Body Bathing: Maximal assistance;+2 for physical assistance;Sitting/lateral leans   Upper Body Dressing : Maximal assistance;Sitting   Lower Body Dressing: Total assistance;+2 for physical assistance;Sitting/lateral leans                 General ADL Comments: Pt lethargic possibly due to pain medications and difficult to remain awake/alert. Pt able to move to EOB to sit, but required UE support to maintain balance and quickly fatigued. Pt presents with decreased MMT strength durnig testing, however able to use UE to assist in scoot to EOB. Pt's family tends to perform grooming tasks for her and encouraged them to let her be as independent as possible. Attempted to encourage pt to stand to assist with respiration, however after sitting EOB about 2-3 minutes pt declined and requested to get back in bed.      Vision  Pt reports no change from baseline but has hx of visual  deficits. States that she uses eye drops.                    Perception Perception Perception Tested?: No   Praxis Praxis Praxis tested?: Within functional limits    Pertinent Vitals/Pain Pain Assessment: 0-10 Pain Score: 3  Pain Location: "all over" Pain Descriptors / Indicators: Aching Pain Intervention(s): Limited activity within patient's tolerance;Repositioned      Hand Dominance Right   Extremity/Trunk Assessment Upper Extremity Assessment Upper Extremity Assessment: Generalized weakness (pt unable to reach up to shoulder height for MMT, however able to push through Bil UEs to scoot to EOB. Likely the result of lethargy)   Lower Extremity Assessment Lower Extremity Assessment: Defer to PT evaluation   Cervical / Trunk Assessment Cervical / Trunk Assessment: Kyphotic   Communication Communication Communication: No difficulties   Cognition Arousal/Alertness: Awake/alert Behavior During Therapy: WFL for tasks assessed/performed Overall Cognitive Status: Impaired/Different from baseline Area of Impairment: Following commands;Attention   Current Attention Level: Sustained   Following Commands: Follows one step commands inconsistently;Follows one step commands with increased time       General Comments: Pt demonstrated difficulty following commands such as "touch my shoulder" but family reports this may be due to lethargy from pain medications recently administered. Pt appeared more alert once upright sitting EOB but fatigued quickly.   General Comments    pt wheezing throughout session and on 3L of O2. Family requested RT to come for breathing treatment following OT session.     Exercises Exercises: General Upper Extremity     10/31/13 1600  General Exercises - Upper Extremity  Shoulder Flexion AAROM;Both;5 reps;Supine  Shoulder Horizontal ABduction AAROM;Both;5 reps;Supine  Shoulder Horizontal ADduction AAROM;Both;5 reps;Supine           Home Living Family/patient expects to be discharged to:: Private residence Living Arrangements: Children Available Help at Discharge: Family (pt's daughter staying for one week) Type of Home: House Home Access: Stairs to enter Technical brewer of Steps: 4 Entrance Stairs-Rails: None Home Layout: One level     Bathroom Shower/Tub: Tub/shower unit         Home Equipment: Cane  - single point          Prior Functioning/Environment Level of Independence: Independent with assistive device(s)        Comments: with use of cane    OT Diagnosis: Generalized weakness;Acute pain   OT Problem List: Decreased strength;Decreased range of motion;Decreased activity tolerance;Impaired balance (sitting and/or standing);Decreased safety awareness;Obesity;Pain   OT Treatment/Interventions: Self-care/ADL training;Therapeutic exercise;Energy conservation;DME and/or AE instruction;Therapeutic activities;Patient/family education;Balance training    OT Goals(Current goals can be found in the care plan section) Acute Rehab OT Goals Patient Stated Goal: to get stronger OT Goal Formulation: With patient/family Time For Goal Achievement: 11/07/13 Potential to Achieve Goals: Good ADL Goals Pt Will Perform Grooming: with set-up;with supervision (sitting EOB without support) Pt Will Transfer to Toilet: with supervision;stand pivot transfer;bedside commode Pt/caregiver will Perform Home Exercise Program: Increased strength;Both right and left upper extremity;With Supervision Additional ADL Goal #1: Pt will sit EOB for >5 minutes without becoming fatigued with Supervision.  OT Frequency: Min 1X/week    End of Session Equipment Utilized During Treatment: Oxygen Nurse Communication: Other (comment) (pt couched up colored sputum following return to bed)  Activity Tolerance: Patient limited by fatigue;Patient limited by lethargy Patient left: in bed;with call bell/phone within reach;with family/visitor present   Time: 9767-3419 OT Time Calculation (min): 27 min Charges:  OT General Charges $  OT Visit: 1 Procedure OT Evaluation $Initial OT Evaluation Tier I: 1 Procedure OT Treatments $Self Care/Home Management : 8-22 mins  [place], [place] [phone] 10/31/2013, 5:07 PM

## 2013-10-31 NOTE — Progress Notes (Signed)
Pt coughing up tan, thick sputum. Dr. Hulen Skains notified. Orders received.

## 2013-10-31 NOTE — Clinical Social Work Placement (Addendum)
Clinical Social Work Department CLINICAL SOCIAL WORK PLACEMENT NOTE 10/31/2013  Patient:  ANALYA, L[place]SA[place]  Account Number:  [id number] [place] date:  10/25/2013  Clinical Social Worker:  Domenica Reamer, CLINICAL SOCIAL WORKER  Date/time:  10/31/2013 01:56 PM  Clinical Social Work is seeking post-discharge placement for this patient at the following level of care:   SKILLED NURSING   (*CSW will update this form in Epic as items are completed)   10/31/2013  Patient/family provided with [place] Department of Clinical Social Work's list of facilities offering this level of care within the geographic area requested by the patient (or if unable, by the patient's family).  10/31/2013  Patient/family informed of their freedom to choose among providers that offer the needed level of care, that participate in Medicare, Medicaid or managed care program needed by the patient, have an available bed and are willing to accept the patient.  10/31/2013  Patient/family informed of [facility]' ownership interest in [facility], as well as of the fact that they are under no obligation to receive care at this facility.  PASARR submitted to EDS on 10/31/2013 PASARR number received on 10/31/2013  FL2 transmitted to all facilities in geographic area requested by pt/family on  10/31/2013 FL2 transmitted to all facilities within larger geographic area on   Patient informed that his/her managed care company has contracts with or will negotiate with  certain facilities, including the following:     Patient/family informed of bed offers received:  11/01/13 Patient chooses bed at [facility] Physician recommends and patient chooses bed at    Patient to be transferred to  on  Patient now choosing home health with support of daughter, Stanton Kidney. Patient to be transferred to facility by  Patient and family notified of transfer on  Name of family member notified:    The following physician request were  entered in Epic:   Additional Comments:   Domenica Reamer, [place] Social Worker [phone]

## 2013-10-31 NOTE — Progress Notes (Signed)
Pt. Family member stated that pt does not wear BiPAP, and does not want to wear at this time. RT notified pt that she may request to go on BiPAP at any time she needed.

## 2013-10-31 NOTE — Progress Notes (Signed)
Physical Therapy Treatment Patient Details Name: [name] MRN: [id number] DOB: [birth year] Today's Date: 10/31/2013    History of Present Illness [name] is a 78 year old female with a history of HTN, obesity, hypothyroidism, breast cancer, s/p lumpectomy, abdominal hysterectomy, hernia repair, GERD and asthma.  She presents to [facility] with abdominal pain.  Duration of symptoms is 2 days. Onset was Wednesday afternoon after eating lunch.  Time pattern is constant.  Severe in severity. Characterized as "it just hurts."  No aggravating or alleviating factors.  Modifying factors include; massage and Advil.  She reports calling EMS yesterday who stated she had a GI bug, a temp of 102 and recommended she take Advil.  Associated symptoms include; nausea, she vomited today after coming to the ED.  She had a bowel movement this morning.  She denies recent diarrhea, constipation, weight loss or weight gain. She reports intermittent abdominal pain over the last few months. A CT of abdomen and pelvis showed a bowel obstruction due to incarcerated ventral hernia, 2 herniations are noted involving the transverse colon, no free air.  Normal lactic acid.  White count of 15.2K.       PT Comments    Pt continues to be limited in mobility due to generalized deconditioning and SOB. Pt to benefit from SNF for post acute rehab due to living alone. Pt agreeable at this time. Will cont to follow per POC.   Follow Up Recommendations  SNF;Supervision/Assistance - 24 hour     Equipment Recommendations  Rolling walker with 5" wheels    Recommendations for Other Services       Precautions / Restrictions Precautions Precautions: Fall Precaution Comments: hernia repair with JP drain, monitor sats Restrictions Weight Bearing Restrictions: No    Mobility  Bed Mobility Overal bed mobility: Needs Assistance Bed Mobility: Supine to Sit     Supine to sit: Min guard;HOB elevated     General bed mobility comments:  cues for sequencing; relied heavily on handrails and HOB elevated; pt very weak and fatigued with bed mobility   Transfers Overall transfer level: Needs assistance Equipment used: Rolling walker (2 wheeled) Transfers: Sit to/from Stand Sit to Stand: Min assist         General transfer comment: (A) to maintain balance and achieve upright standing position; cues for sequencing   Ambulation/Gait Ambulation/Gait assistance: Min assist Ambulation Distance (Feet): 12 Feet Assistive device: Rolling walker (2 wheeled) Gait Pattern/deviations: Decreased stride length;Shuffle;Step-through pattern;Wide base of support Gait velocity: decreased Gait velocity interpretation: Below normal speed for age/gender General Gait Details: (A) to maintain balance and manage RW; cues for upright posture and deep breathing    Stairs            Wheelchair Mobility    Modified Rankin (Stroke Patients Only)       Balance Overall balance assessment: Needs assistance Sitting-balance support: Feet supported;No upper extremity supported Sitting balance-Leahy Scale: Fair Sitting balance - Comments: sat EOB ~5 min; no c/o dizziness   Standing balance support: During functional activity;Bilateral upper extremity supported Standing balance-Leahy Scale: Poor Standing balance comment: relies heavily on RW for UE support                    Cognition Arousal/Alertness: Awake/alert Behavior During Therapy: WFL for tasks assessed/performed Overall Cognitive Status: Within Functional Limits for tasks assessed                      Exercises General  Exercises - Lower Extremity Ankle Circles/Pumps: AROM;Strengthening;Both;10 reps;Supine Long Arc Quad: AROM;Strengthening;Both;5 reps;Seated Hip Flexion/Marching: AROM;Strengthening;Both;5 reps;Seated    General Comments General comments (skin integrity, edema, etc.): pt wheezing throughout session; pt on 3L o2; RT present for breathing  treatment      Pertinent Vitals/Pain Pain Assessment: 0-10 Pain Score: 3  Pain Location: "all over" Pain Intervention(s): Limited activity within patient's tolerance;PCA encouraged;Repositioned    Home Living                      Prior Function            PT Goals (current goals can now be found in the care plan section) Acute Rehab PT Goals Patient Stated Goal: to breathe better  PT Goal Formulation: With patient Time For Goal Achievement: 11/13/13 Potential to Achieve Goals: Good Progress towards PT goals: Progressing toward goals    Frequency  Min 3X/week    PT Plan Current plan remains appropriate    Co-evaluation             End of Session Equipment Utilized During Treatment: Gait belt;Oxygen Activity Tolerance: Patient limited by fatigue Patient left: in chair;with call bell/phone within reach;with family/visitor present;Other (comment) (RT in room)     Time: 7408-1448 PT Time Calculation (min): 24 min  Charges:  $Gait Training: 8-22 mins $Therapeutic Exercise: 8-22 mins                    G Codes:      Elie Confer [place], [place]  [phone] 10/31/2013, 1:16 PM

## 2013-11-01 ENCOUNTER — Inpatient Hospital Stay (HOSPITAL_COMMUNITY): Payer: Medicare HMO

## 2013-11-01 ENCOUNTER — Encounter (HOSPITAL_COMMUNITY): Payer: Self-pay | Admitting: General Surgery

## 2013-11-01 DIAGNOSIS — E119 Type 2 diabetes mellitus without complications: Secondary | ICD-10-CM

## 2013-11-01 LAB — URINE CULTURE
COLONY COUNT: NO GROWTH
CULTURE: NO GROWTH

## 2013-11-01 LAB — BASIC METABOLIC PANEL
Anion gap: 12 (ref 5–15)
BUN: 34 mg/dL — ABNORMAL HIGH (ref 6–23)
CALCIUM: 7.6 mg/dL — AB (ref 8.4–10.5)
CO2: 18 meq/L — AB (ref 19–32)
CREATININE: 2.26 mg/dL — AB (ref 0.50–1.10)
Chloride: 102 mEq/L (ref 96–112)
GFR calc Af Amer: 23 mL/min — ABNORMAL LOW (ref >90)
GFR calc non Af Amer: 20 mL/min — ABNORMAL LOW (ref >90)
GLUCOSE: 110 mg/dL — AB (ref 70–99)
Potassium: 4.3 mEq/L (ref 3.7–5.3)
Sodium: 132 mEq/L — ABNORMAL LOW (ref 137–147)

## 2013-11-01 LAB — SODIUM, URINE, RANDOM: Sodium, Ur: 56 mEq/L

## 2013-11-01 LAB — CREATININE, URINE, RANDOM: Creatinine, Urine: 65.74 mg/dL

## 2013-11-01 MED ORDER — SODIUM CHLORIDE 0.9 % IV BOLUS (SEPSIS)
500.0000 mL | Freq: Once | INTRAVENOUS | Status: AC
  Administered 2013-11-01: 500 mL via INTRAVENOUS

## 2013-11-01 NOTE — Consult Note (Signed)
Reason for Consult:AKI Referring Physician: Georganna Skeans, MD  [name] is an 78 y.o. female.  HPI: Patient is a 77 yo female with PMH of HTN, HLD, Breast Cancer (treated surgically), GERD, Osteoarthritis, Obesity, she was admitted on 8/21 for a colonic obstruction secondary to incarcerated ventral hernia.  She was initially placed on an NGT for conservative therapy of her obstruction.  On 8/24 she was noted to have some AKI with increased SCr from 0.9 to 1.4 along with elevated BUN she was given IVF overnight and her lisinopril and her SCr returned to baseline prior to hernia repair surgery on 8/25.  Labs from 8/27 show an increase SCr to 2 without much elevation of BUN.  Of note she takes Lisinopril, HCTZ, and amlodipine at home, her lisinopril and amlodipine were held on 8/26 due to hypotension and Lisinopril and HCTZ were discontinued on 8/27.  Review of her BP log shows lowest BP was 95/49 at 10am on 8/26 and has roughly remained borderline postop.  She has been oliguric on 8/26 and 8/27.  She had been receiving IVF: D5NS at 75cc until 8/27 this was changed to NS at 125cc. She has received IV contrast with CT abd on 8/21 no other IV contrast.  She has received no NSAIDs.  Trend in Creatinine: Creat  Date/Time Value Ref Range Status  09/19/2013  9:11 AM 0.93  0.50 - 1.10 mg/dL Final  04/26/2012 10:55 AM 0.59  0.50 - 1.10 mg/dL Final  01/19/2012 11:42 AM 0.85  0.50 - 1.10 mg/dL Final  09/02/2011 11:07 AM 0.88  0.50 - 1.10 mg/dL Final  08/25/2011  5:03 PM 0.74  0.50 - 1.10 mg/dL Final  08/22/2011  3:48 PM 0.94  0.50 - 1.10 mg/dL Final  07/28/2011 11:59 AM 0.70  0.50 - 1.10 mg/dL Final  07/15/2011  2:02 PM 0.84  0.50 - 1.10 mg/dL Final  07/14/2011  4:52 PM 0.82  0.50 - 1.10 mg/dL Final  06/14/2011  9:59 AM 1.03  0.50 - 1.10 mg/dL Final  05/19/2011  3:37 PM 1.14* 0.50 - 1.10 mg/dL Final  09/09/2010 10:36 AM 0.96  0.50 - 1.10 mg/dL Final     Creatinine, Ser  Date/Time Value Ref Range Status  11/01/2013   6:06 AM 2.26* 0.50 - 1.10 mg/dL Final  10/31/2013  2:35 PM 2.05* 0.50 - 1.10 mg/dL Final  10/31/2013  5:49 AM 2.06* 0.50 - 1.10 mg/dL Final     DELTA CHECK NOTED  10/29/2013  7:25 AM 0.81  0.50 - 1.10 mg/dL Final     DELTA CHECK NOTED  10/28/2013  5:34 AM 1.40* 0.50 - 1.10 mg/dL Final  10/26/2013  5:57 AM 1.13* 0.50 - 1.10 mg/dL Final  10/25/2013 12:22 PM 0.95  0.50 - 1.10 mg/dL Final  06/30/2011  5:35 AM 0.84  0.50 - 1.10 mg/dL Final  06/29/2011  5:45 AM 0.93  0.50 - 1.10 mg/dL Final  06/28/2011  6:45 AM 0.98  0.50 - 1.10 mg/dL Final  06/27/2011  7:25 AM 1.20* 0.50 - 1.10 mg/dL Final  06/26/2011  9:45 PM 1.27* 0.50 - 1.10 mg/dL Final  10/22/2009  7:32 PM 0.82  (0.40-1.20 mg/dL Final  09/28/2009  6:10 PM 0.9  0.4 - 1.2 mg/dL Final  04/17/2009  4:15 AM 0.84  (0.40-1.20 mg/dL Final  08/20/2008  9:12 PM 1.00  0.40-1.20 mg/dL Final  05/22/2008  8:29 PM 0.93  0.40-1.20 mg/dL Final  03/26/2008  8:55 PM 0.81  0.40-1.20 mg/dL Final  12/10/2007  8:02 PM 1.06  0.40-1.20 mg/dL Final  09/14/2007  8:41 PM 0.89  0.40-1.20 mg/dL Final  03/15/2006 10:21 PM 1.08  0.40-1.20 mg/dL Final    PMH:   Past Medical History  Diagnosis Date  . Hypothyroidism   . Lumbago   . Hyperlipidemia   . Hx of breast cancer   . Allergic rhinitis, cause unspecified   . Hypertension   . right shoulder pain, likely Impingement syndrome  09/09/2010  . OBESITY NOS 03/20/2006    Qualifier: Diagnosis of  By: Tomasa Hosteller MD, Veronique D.   . history of Bilateral leg edema-likely amlodipine induced. 07/28/2011  . history of CAP (community acquired pneumonia) 07/14/2011  . GERD (gastroesophageal reflux disease) 05/19/2011  . OSTEOARTHRITIS 12/13/2005    Annotation: bilateral knees;right knee injection 6/05 Qualifier: Diagnosis of  By: Tomasa Hosteller MD, Veronique D.   . VENOUS INSUFFICIENCY, CHRONIC 03/20/2006    Qualifier: Diagnosis of  By: Tomasa Hosteller MD, Veronique D.   . history of HYPOTHYROIDISM, BORDERLINE 03/20/2006    Annotation: Asymptomatic, untreated  Qualifier: Diagnosis of  By: Tomasa Hosteller MD, Edmon Crape.   . Chronic back pain   . Cancer   . Asthma     PSH:   Past Surgical History  Procedure Laterality Date  . Total hip arthroplasty    . Breast lumpectomy    . Abdominal hysterectomy    . Hernia repair      Allergies:  Allergies  Allergen Reactions  . Metoprolol Itching  . Other Rash    pickles    Medications:   Prior to Admission medications   Medication Sig Start Date End Date Taking? Authorizing Provider  Acetaminophen-Codeine (TYLENOL/CODEINE #3) 300-30 MG per tablet Take 1 tablet by mouth 3 (three) times daily as needed for pain. 09/19/13 09/19/14 Yes Nischal Narendra, MD  albuterol (PROVENTIL HFA;VENTOLIN HFA) 108 (90 BASE) MCG/ACT inhaler Inhale 2 puffs into the lungs every 6 (six) hours as needed for wheezing. 10/15/12 10/25/13 Yes Alejandro Paya, DO  amLODipine (NORVASC) 5 MG tablet Take 5 mg by mouth daily.   Yes Historical Provider, MD  atorvastatin (LIPITOR) 40 MG tablet Take 1 tablet (40 mg total) by mouth daily.  07/18/14 Yes Aldine Contes, MD  calcium citrate-vitamin D (CITRACAL+D) 315-200 MG-UNIT per tablet Take 1 tablet by mouth 2 (two) times daily. 02/15/13  Yes Alejandro Paya, DO  carvedilol (COREG) 6.25 MG tablet Take 1 tablet (6.25 mg total) by mouth 2 (two) times daily with a meal.   Yes Nischal Narendra, MD  lisinopril-hydrochlorothiazide (PRINZIDE,ZESTORETIC) 20-25 MG per tablet Take 1 tablet by mouth daily.   Yes Aldine Contes, MD  pantoprazole (PROTONIX) 40 MG tablet Take 40 mg by mouth daily.   Yes Historical Provider, MD  potassium chloride SA (K-DUR,KLOR-CON) 20 MEQ tablet Take 40 mEq by mouth daily.   Yes Historical Provider, MD    Inpatient medications: . albuterol  2.5 mg Nebulization TID  . amLODipine  5 mg Oral Daily  . antiseptic oral rinse  7 mL Mouth Rinse BID  . carvedilol  6.25 mg Oral BID WC  . enoxaparin (LOVENOX) injection  40 mg Subcutaneous Q24H  . guaiFENesin  600 mg Oral BID   . pantoprazole  40 mg Oral Daily    Discontinued Meds:   Medications Discontinued During This Encounter  Medication Reason  . amLODipine (NORVASC) 5 MG tablet Inpatient Standard  . pantoprazole (PROTONIX) 40 MG tablet Inpatient Standard  . potassium chloride SA (KLOR-CON M20) 20 MEQ tablet Inpatient Standard  . zoster vaccine live, PF, (  ZOSTAVAX) 93716 UNT/0.65ML injection Inpatient Standard  . ondansetron (ZOFRAN) injection 4 mg   . 0.9 %  sodium chloride infusion   . albuterol (PROVENTIL HFA;VENTOLIN HFA) 108 (90 BASE) MCG/ACT inhaler 2 puff Formulary change  . lisinopril-hydrochlorothiazide (PRINZIDE,ZESTORETIC) 20-25 MG per tablet 1 tablet Formulary change  . albuterol (PROVENTIL) (2.5 MG/3ML) 0.083% nebulizer solution 2.5 mg   . enoxaparin (LOVENOX) injection 40 mg   . theophylline in dextrose infusion 0.8 mg/mL Formulary change  . bupivacaine-EPINEPHrine (MARCAINE W/ EPI) 0.25% -1:200000 (with pres) injection Patient Discharge  . 0.9 % irrigation (POUR BTL) Patient Discharge  . fentaNYL (SUBLIMAZE) injection 25-50 mcg Patient Transfer  . meperidine (DEMEROL) injection 6.25-12.5 mg Patient Transfer  . promethazine (PHENERGAN) injection 6.25-12.5 mg Patient Transfer  . morphine 2 MG/ML injection 2 mg   . albuterol (PROVENTIL) (2.5 MG/3ML) 0.083% nebulizer solution 2.5 mg   . lisinopril (PRINIVIL,ZESTRIL) tablet 20 mg   . hydrochlorothiazide (HYDRODIURIL) tablet 25 mg   . morphine 1 MG/ML PCA injection   . ondansetron (ZOFRAN) injection 4 mg   . naloxone (NARCAN) injection 0.4 mg   . sodium chloride 0.9 % injection 9 mL   . diphenhydrAMINE (BENADRYL) injection 12.5 mg   . diphenhydrAMINE (BENADRYL) 12.5 MG/5ML elixir 12.5 mg   . oxyCODONE (Oxy IR/ROXICODONE) immediate release tablet 5-10 mg   . dextrose 5 % and 0.9 % NaCl with KCl 20 mEq/L infusion   . pantoprazole (PROTONIX) injection 40 mg Inpatient Standard    Social History:  reports that she quit smoking about 55 years  ago. She has never used smokeless tobacco. She reports that she does not drink alcohol or use illicit drugs.  Family History:   Family History  Problem Relation Age of Onset  . Hypertension Mother   . Alzheimer's disease Mother   . Diabetes Sister     Review of Systems  Constitutional: Negative for fever, chills and malaise/fatigue.  Eyes: Negative for blurred vision.  Respiratory: Negative for shortness of breath.   Cardiovascular: Negative for chest pain and leg swelling.  Gastrointestinal: Negative for heartburn, nausea, vomiting, abdominal pain and diarrhea.  Genitourinary: Negative for dysuria, urgency and hematuria.  Skin: Negative for rash.  Neurological: Negative for dizziness, speech change and headaches.    Weight change:   Intake/Output Summary (Last 24 hours) at 11/01/13 1210 Last data filed at 11/01/13 1052  Gross per 24 hour  Intake    720 ml  Output    165 ml  Net    555 ml   BP 98/57  Pulse 81  Temp(Src) 98.2 F (36.8 C) (Axillary)  Resp 18  Ht 5\' 1"  (1.549 m)  Wt 236 lb 12.4 oz (107.4 kg)  BMI 44.76 kg/m2  SpO2 94% Filed Vitals:   10/31/13 2023 10/31/13 2200 11/01/13 0517 11/01/13 1110  BP:  99/56 98/57   Pulse:  81 81   Temp:  98.9 F (37.2 C) 98.2 F (36.8 C)   TempSrc:  Oral Axillary   Resp:  21 18   Height:      Weight:      SpO2: 95% 97% 96% 94%     General: Obese elderly female sitting up in chair, on [place] O2 Cardiac: RRR, no rubs, murmurs or gallops Pulm: wheezing, diminished Abd: midline surgical scar with staples, soft, minimal tednerness, nondistended, BS present Ext: warm and well perfused, no pedal edema Neuro: alert and oriented X3, no focal deficit   Labs: Basic Metabolic Panel:  Recent Labs Lab  10/25/13 1222 10/26/13 0557 10/28/13 0534 10/29/13 0725 10/31/13 0549 10/31/13 1435 11/01/13 0606  NA 139 137 135* 136* 135* 131* 132*  K 3.6* 3.2* 3.3* 3.5* 4.5 4.4 4.3  CL 95* 97 100 101 102 99 102  CO2 25 23 23 24 22  18*  18*  GLUCOSE 156* 168* 135* 144* 140* 123* 110*  BUN 18 26* 49* 20 23 26* 34*  CREATININE 0.95 1.13* 1.40* 0.81 2.06* 2.05* 2.26*  ALBUMIN 4.0  --   --   --   --   --   --   CALCIUM 10.1 8.9 7.9* 8.0* 7.9* 8.0* 7.6*   Liver Function Tests:  Recent Labs Lab 10/25/13 1222  AST 22  ALT 7  ALKPHOS 61  BILITOT 0.9  PROT 8.6*  ALBUMIN 4.0    Recent Labs Lab 10/25/13 1222  LIPASE 31   No results found for this basename: AMMONIA,  in the last 168 hours CBC:  Recent Labs Lab 10/25/13 1222 10/26/13 0557 10/28/13 0534 10/29/13 0725  WBC 15.2* 10.1 7.3 5.4  NEUTROABS 13.5*  --   --   --   HGB 14.1 11.8* 10.6* 11.2*  HCT 43.1 37.2 32.6* 34.9*  MCV 81.9 80.5 81.9 82.3  PLT 278 233 220 244   PT/INR: @LABRCNTIP (inr:5) Cardiac Enzymes: ) Recent Labs Lab 10/25/13 1222  TROPONINI <0.30   CBG: No results found for this basename: GLUCAP,  in the last 168 hours  Iron Studies: No results found for this basename: IRON, TIBC, TRANSFERRIN, FERRITIN,  in the last 168 hours  Xrays/Other Studies: No results found. Urinalysis    Component Value Date/Time   COLORURINE AMBER* 10/31/2013 1120   APPEARANCEUR CLOUDY* 10/31/2013 1120   LABSPEC 1.019 10/31/2013 1120   PHURINE 5.5 10/31/2013 1120   GLUCOSEU NEGATIVE 10/31/2013 1120   GLUCOSEU NEG mg/dL 12/10/2007 2002   HGBUR MODERATE* 10/31/2013 1120   BILIRUBINUR SMALL* 10/31/2013 1120   KETONESUR 15* 10/31/2013 1120   PROTEINUR 30* 10/31/2013 1120   UROBILINOGEN 1.0 10/31/2013 1120   NITRITE NEGATIVE 10/31/2013 1120   LEUKOCYTESUR TRACE* 10/31/2013 1120       Assessment/Plan: 1.  Acute on CKD stage 2: Patient with Baseline SCr ~0.9 with previous history of some proteinuria.  Her current acute kidney injury is likely secondary to post op hypotension in setting of ACEi, diuretic use causing ischemic ATN.  Of note I/O not recorded thru much of stay, suspect Intake not accurate either (9.7L intake of D5NS +20kcl in PM of  8/25??). 1. Obtain Urine Studies: UNa, UCr.  Obtain Renal U/S to r/o hydronephrosis (although doubt given BUN unchanged), Trend renal function 2.  IVF NS at 125 cc/hr  Will d/c at 8pm 3. Optimize hemodynamics: hold amlodipine. 4. Monitor I&O. 5. May need trial of diuretics tomorrow. 2. Hyponatremia: possibly secondary to extra renal loses verus SIADH from post op state. She is also not far from her admission Na of 135. Will check Serum osmo, Urine osmo with urine studies. 3. HTN: Continue to hold Lisinopril/HCTZ. Hold Amlodipine. 4. Colonic Obstruction s/p hernia repair: doing well, plan per surgery.  Lucious Groves 11/01/2013, 12:10 PM

## 2013-11-01 NOTE — Progress Notes (Signed)
3 Days Post-Op  Subjective: Passing gas, feels better  Objective: Vital signs in last 24 hours: Temp:  [98.2 F (36.8 C)-98.9 F (37.2 C)] 98.2 F (36.8 C) (08/28 0517) Pulse Rate:  [81-88] 81 (08/28 0517) Resp:  [18-21] 18 (08/28 0517) BP: (98-110)/(54-65) 98/57 mmHg (08/28 0517) SpO2:  [91 %-98 %] 96 % (08/28 0517) Last BM Date: 10/29/13  Intake/Output from previous day: 08/27 0701 - 08/28 0700 In: 840 [P.O.:840] Out: 315 [Urine:275; Drains:40] Intake/Output this shift:    General appearance: alert and cooperative Resp: wheeze B Cardio: regular rate and rhythm GI: soft, incision CDI, +BS, JP SS Foley with concentrated urine  Lab Results:  No results found for this basename: WBC, HGB, HCT, PLT,  in the last 72 hours BMET  Recent Labs  10/31/13 1435 11/01/13 0606  NA 131* 132*  K 4.4 4.3  CL 99 102  CO2 18* 18*  GLUCOSE 123* 110*  BUN 26* 34*  CREATININE 2.05* 2.26*  CALCIUM 8.0* 7.6*   PT/INR No results found for this basename: LABPROT, INR,  in the last 72 hours ABG No results found for this basename: PHART, PCO2, PO2, HCO3,  in the last 72 hours  Studies/Results: No results found.  Anti-infectives: Anti-infectives   Start     Dose/Rate Route Frequency Ordered Stop   10/29/13 1300  [MAR Hold]  ceFAZolin (ANCEF) IVPB 2 g/50 mL premix     (On MAR Hold since 10/29/13 1252)   2 g 100 mL/hr over 30 Minutes Intravenous  Once 10/29/13 0910 10/29/13 1408      Assessment/Plan: S/P open repair recurrent incisional hernia with mesh---Dr. Grandville Silos POD#3 -continue drain until tomorrow - consider removal then if output stays down -mobilize Decondiitoning - PT/OT AKI-2.26 today, BP soft,  Stopped hctz/lisinopril 8/27. Give 500cc fluid bolus, foley for I&Os. I consulted renal this AM. FEN - advance diet Resp failure/ATX - continue pulmonary toilet and BDs (250 only on IS) VTE - Lovenox  LOS: 7 days    Traye Bates E 11/01/2013

## 2013-11-01 NOTE — Consult Note (Signed)
I have personally seen and examined this patient and agree with the assessment/plan as outlined above by Heber Millbrae DO (PGY2). 78 year old [place] American woman with past history of hypertension, breast cancer status post lumpectomy, osteoarthritis and recent surgery for colonic obstruction due to incarcerated ventral hernia who developed postoperative acute renal failure. Preoperatively, was noted to have a rise of her creatinine that responded well to intravenous fluids and withholding her ACE inhibitor/diuretic. It appears that she suffered some transient hypotension that may have led to ischemic ATN. Urinalysis not suggestive of acute GN. Urine electrolytes will be obtained today and renal ultrasound requested. Will continue intravenous normal saline for the next 6-8 hours and then discontinued for fear of volume overload given marginal urine output. Attempt diuretic challenge tomorrow. No acute dialysis needs noted at this time. Hyponatremia appears to be from defective water handling in the face of acute renal failure, agree with oral fluid restriction of 1200 cc per 24 hours. Check urine osmolality/serum osmolality for further workup of this. She likely has chronic baseline hyponatremia from ongoing thiazide use.  Lynnox Girten K.,MD 11/01/2013 2:31 PM

## 2013-11-01 NOTE — Care Management Note (Signed)
  Page 1 of 1   11/04/2013     12:05:54 PM CARE MANAGEMENT NOTE 11/04/2013  Patient:  [name], [name]   Account Number:  [id number]  Date Initiated:  10/30/2013  Documentation initiated by:  Magdalen Spatz  Subjective/Objective Assessment:     Action/Plan:   Anticipated DC Date:  11/04/2013   Anticipated DC Plan:  [place]         Choice offered to / List presented to:  C-1 Patient   DME arranged  [place]      DME agency  [place] arranged  HH-2 PT      [place].   Status of service:  Completed, signed off Medicare Important Message given?  YES (If response is "NO", the following Medicare IM given date fields will be blank) Date Medicare IM given:  11/01/2013 Medicare IM given by:  Magdalen Spatz Date Additional Medicare IM given:  11/04/2013 Additional Medicare IM given by:  Magdalen Spatz  Discharge Disposition:    Per UR Regulation:    If discussed at Long Length of Stay Meetings, dates discussed:   10/30/2013    Comments:

## 2013-11-02 LAB — BASIC METABOLIC PANEL
ANION GAP: 10 (ref 5–15)
BUN: 22 mg/dL (ref 6–23)
CALCIUM: 8.1 mg/dL — AB (ref 8.4–10.5)
CO2: 23 mEq/L (ref 19–32)
CREATININE: 1.28 mg/dL — AB (ref 0.50–1.10)
Chloride: 101 mEq/L (ref 96–112)
GFR, EST AFRICAN AMERICAN: 46 mL/min — AB (ref >90)
GFR, EST NON AFRICAN AMERICAN: 40 mL/min — AB (ref >90)
Glucose, Bld: 158 mg/dL — ABNORMAL HIGH (ref 70–99)
Potassium: 4.5 mEq/L (ref 3.7–5.3)
SODIUM: 134 meq/L — AB (ref 137–147)

## 2013-11-02 LAB — CLOSTRIDIUM DIFFICILE BY PCR: CDIFFPCR: NEGATIVE

## 2013-11-02 LAB — OSMOLALITY, URINE: OSMOLALITY UR: 475 mosm/kg (ref 390–1090)

## 2013-11-02 LAB — OSMOLALITY: Osmolality: 283 mOsm/kg (ref 275–300)

## 2013-11-02 NOTE — Progress Notes (Signed)
4 Days Post-Op  Subjective: Had 2 bowel movements. Tolerating diet. Has not ambulated in the hall despite encouragement. I have encouraged her again the importance of this. Foley catheter remains in. Urine output has picked up. Bmetnot drawn. Have ordered this stat. Appreciate nephrology input   Objective: Vital signs in last 24 hours: Temp:  [97.8 F (36.6 C)-98.2 F (36.8 C)] 98.2 F (36.8 C) (08/29 0559) Pulse Rate:  [79-97] 79 (08/29 0559) Resp:  [17-20] 20 (08/29 0559) BP: (115-142)/(53-67) 142/67 mmHg (08/29 0559) SpO2:  [94 %-100 %] 100 % (08/29 0817) Last BM Date: 11/02/13  Intake/Output from previous day: 08/28 0701 - 08/29 0700 In: 1220 [P.O.:720; I.V.:500] Out: 1870 [VVOHY:0737; Drains:95] Intake/Output this shift:    General appearance: alert. No distress. Sitting on the edge of bed. Family in room. GI: obese. Soft. Wound clean. Bowel sounds present. JP drain serous, 95 cc last 24 hours. Picking up.  Lab Results:  Results for orders placed during the hospital encounter of 10/25/13 (from the past 24 hour(s))  SODIUM, URINE, RANDOM     Status: None   Collection Time    11/01/13  7:00 PM      Result Value Ref Range   Sodium, Ur 56    CREATININE, URINE, RANDOM     Status: None   Collection Time    11/01/13  7:00 PM      Result Value Ref Range   Creatinine, Urine 65.74    OSMOLALITY, URINE     Status: None   Collection Time    11/01/13  7:00 PM      Result Value Ref Range   Osmolality, Ur 475  390 - 1090 mOsm/kg  CLOSTRIDIUM DIFFICILE BY PCR     Status: None   Collection Time    11/02/13  5:45 AM      Result Value Ref Range   C difficile by pcr NEGATIVE  NEGATIVE     Studies/Results: US Renal  11/01/2013   CLINICAL DATA:  78 year old female with acute on chronic renal disease and oliguria. Initial encounter.  EXAM: RENAL/URINARY TRACT ULTRASOUND COMPLETE  COMPARISON:  CT Abdomen and Pelvis 10/25/2013.  FINDINGS: Right Kidney:  Length: 9.4 cm.  Echogenicity within normal limits. No mass or hydronephrosis visualized.  Left Kidney: Not well visualized in part due to abdominal dressing material.  Length: Approximately 9.1 cm. No hydronephrosis is evident. Grossly symmetric cortical echogenicity, within normal limits.  Bladder:  Not visualized, reportedly Foley catheter in place.  IMPRESSION: No acute renal findings.   Electronically Signed   By: Lars Pinks M.D.   On: 11/01/2013 18:03    . albuterol  2.5 mg Nebulization TID  . antiseptic oral rinse  7 mL Mouth Rinse BID  . carvedilol  6.25 mg Oral BID WC  . enoxaparin (LOVENOX) injection  40 mg Subcutaneous Q24H  . guaiFENesin  600 mg Oral BID  . pantoprazole  40 mg Oral Daily     Assessment/Plan: s/p Procedure(s): OPEN REPAIR OF RECURRENT INCISIONAL HERNIA WITH MESH  S/P open repair recurrent incisional hernia with mesh---Dr. Grandville Silos  POD#4 Ileus resolving.  -continue drain until tomorrow - Drainage increased last 24 hours, but still appears benign. -mobilize   [place]condiitoning - PT/OT   AKI- non-oliguric. 2.26 yesterday.  , BP 142/67, Stopped hctz/lisinopril 8/27. Give 500cc fluid bolus, leave foley for I&Os.   We really appreciate renal advice and management Bmet stat and repeat in am..   FEN - advance diet   Resp  failure/ATX - continue pulmonary toilet and BDs (250 only on IS)   VTE - Lovenox   @PROBHOSP @  LOS: 8 days    [name] M 11/02/2013  . .prob

## 2013-11-02 NOTE — Progress Notes (Signed)
I have personally seen and examined this patient and agree with the assessment/plan as outlined above by Redmond Pulling DO (PGY2). Improving renal function after AKI from what appears to be ATN , excellent UOP overnight- allow autodiuresis at this time and will resume anti-hypertensive therapy tomorrow based on BP/renal function.  Deivi Huckins K.,MD 11/02/2013 11:49 AM

## 2013-11-02 NOTE — Progress Notes (Signed)
S:Ms. Furlough was seen and examined this morning.  Daughter and daughter-in-law at bedside.  She reports diarrhea that started yesterday.   O:BP 142/67  Pulse 79  Temp(Src) 98.2 F (36.8 C) (Oral)  Resp 20  Ht 5\' 1"  (1.549 m)  Wt 107.4 kg (236 lb 12.4 oz)  BMI 44.76 kg/m2  SpO2 100%  Intake/Output Summary (Last 24 hours) at 11/02/13 0940 Last data filed at 11/02/13 0700  Gross per 24 hour  Intake   1220 ml  Output   1870 ml  Net   -650 ml   Intake/Output: I/O last 3 completed shifts: In: 0034 [P.O.:720; I.V.:500] Out: 2035 [Urine:1900; Drains:135]  Intake/Output this shift:    Weight change:  JZP:HXTAVWP up on side of bed in NAD CVS:RRR Resp:decreased BS B/L (possibly due to body habitus) VXY:IAXK, non-tender Ext:+1 B/L edema, non-tender   Recent Labs Lab 10/28/13 0534 10/29/13 0725 10/31/13 0549 10/31/13 1435 11/01/13 0606  NA 135* 136* 135* 131* 132*  K 3.3* 3.5* 4.5 4.4 4.3  CL 100 101 102 99 102  CO2 23 24 22  18* 18*  GLUCOSE 135* 144* 140* 123* 110*  BUN 49* 20 23 26* 34*  CREATININE 1.40* 0.81 2.06* 2.05* 2.26*  CALCIUM 7.9* 8.0* 7.9* 8.0* 7.6*   CBC:  Recent Labs Lab 10/28/13 0534 10/29/13 0725  WBC 7.3 5.4  HGB 10.6* 11.2*  HCT 32.6* 34.9*  MCV 81.9 82.3  PLT 220 244   Studies/Results: US Renal  11/01/2013   CLINICAL DATA:  78 year old female with acute on chronic renal disease and oliguria. Initial encounter.  EXAM: RENAL/URINARY TRACT ULTRASOUND COMPLETE  COMPARISON:  CT Abdomen and Pelvis 10/25/2013.  FINDINGS: Right Kidney:  Length: 9.4 cm. Echogenicity within normal limits. No mass or hydronephrosis visualized.  Left Kidney: Not well visualized in part due to abdominal dressing material.  Length: Approximately 9.1 cm. No hydronephrosis is evident. Grossly symmetric cortical echogenicity, within normal limits.  Bladder:  Not visualized, reportedly Foley catheter in place.  IMPRESSION: No acute renal findings.   Electronically Signed   By: Lars Pinks M.D.   On: 11/01/2013 18:03   . albuterol  2.5 mg Nebulization TID  . antiseptic oral rinse  7 mL Mouth Rinse BID  . carvedilol  6.25 mg Oral BID WC  . enoxaparin (LOVENOX) injection  40 mg Subcutaneous Q24H  . guaiFENesin  600 mg Oral BID  . pantoprazole  40 mg Oral Daily    BMET    Component Value Date/Time   NA 132* 11/01/2013 0606   K 4.3 11/01/2013 0606   CL 102 11/01/2013 0606   CO2 18* 11/01/2013 0606   GLUCOSE 110* 11/01/2013 0606   BUN 34* 11/01/2013 0606   CREATININE 2.26* 11/01/2013 0606   CREATININE 0.93 09/19/2013 0911   CALCIUM 7.6* 11/01/2013 0606   GFRNONAA 20* 11/01/2013 0606   GFRNONAA 60 09/19/2013 0911   GFRAA 23* 11/01/2013 0606   GFRAA 70 09/19/2013 0911   CBC    Component Value Date/Time   WBC 5.4 10/29/2013 0725   RBC 4.24 10/29/2013 0725   HGB 11.2* 10/29/2013 0725   HCT 34.9* 10/29/2013 0725   PLT 244 10/29/2013 0725   MCV 82.3 10/29/2013 0725   MCH 26.4 10/29/2013 0725   MCHC 32.1 10/29/2013 0725   RDW 14.0 10/29/2013 0725   LYMPHSABS 1.1 10/25/2013 1222   MONOABS 0.7 10/25/2013 1222   EOSABS 0.0 10/25/2013 1222   BASOSABS 0.0 10/25/2013 1222   Lab Results  Component Value Date   CREATININE 2.26* 11/01/2013   CREATININE 2.05* 10/31/2013   CREATININE 2.06* 10/31/2013   Assessment/Plan:  1. Acute on CKD stage 2: Patient with Baseline SCr ~0.9 with previous history of some proteinuria. Her current acute kidney injury is likely secondary to post op hypotension in setting of ACEi, diuretic use causing ischemic ATN.  Recorded I&Os do not seem accurate.  No evidence of obstruction on renual [place].  AM labs pending (labs were drawn late due unable to find vein this morning and patient on commode when 2nd attempt made).  Labs were drawn while I was in the room talking with patient and family. 1. Obtain Urine Studies: UNa, UCr; trend BMP 2. Continue to hold antihypertensives 3. Monitor I&O. 4. May need trial of diuretics. 2. Hyponatremia: possibly secondary to extra  renal loses verus SIADH from post op state. She is also not far from her admission Na of 135.  Will check Serum osmo, Urine osmo with urine studies.  Awaiting labs. 3. HTN: Continue to hold Lisinopril/HCTZ/amlodipine. 4. Colonic Obstruction s/p hernia repair: doing well, plan per surgery. 5. Diarrhea - cdiff pending  Duwaine Maxin, DO IMTS, PGY2 [phone] 11/02/2013, 09:40 AM

## 2013-11-03 LAB — RENAL FUNCTION PANEL
ALBUMIN: 2 g/dL — AB (ref 3.5–5.2)
Anion gap: 11 (ref 5–15)
BUN: 13 mg/dL (ref 6–23)
CALCIUM: 8 mg/dL — AB (ref 8.4–10.5)
CHLORIDE: 103 meq/L (ref 96–112)
CO2: 22 mEq/L (ref 19–32)
CREATININE: 0.8 mg/dL (ref 0.50–1.10)
GFR calc Af Amer: 82 mL/min — ABNORMAL LOW (ref >90)
GFR calc non Af Amer: 70 mL/min — ABNORMAL LOW (ref >90)
Glucose, Bld: 139 mg/dL — ABNORMAL HIGH (ref 70–99)
PHOSPHORUS: 1.4 mg/dL — AB (ref 2.3–4.6)
Potassium: 3 mEq/L — ABNORMAL LOW (ref 3.7–5.3)
Sodium: 136 mEq/L — ABNORMAL LOW (ref 137–147)

## 2013-11-03 MED ORDER — POTASSIUM CHLORIDE CRYS ER 20 MEQ PO TBCR: 40.0000 meq | EXTENDED_RELEASE_TABLET | Freq: Two times a day (BID) | ORAL | Status: DC

## 2013-11-03 MED ORDER — POTASSIUM CHLORIDE CRYS ER 20 MEQ PO TBCR
40.0000 meq | EXTENDED_RELEASE_TABLET | Freq: Two times a day (BID) | ORAL | Status: DC
  Administered 2013-11-03: 40 meq via ORAL
  Filled 2013-11-03: qty 2

## 2013-11-03 MED ORDER — FUROSEMIDE 10 MG/ML IJ SOLN
40.0000 mg | Freq: Once | INTRAMUSCULAR | Status: AC
  Administered 2013-11-03: 40 mg via INTRAVENOUS
  Filled 2013-11-03: qty 4

## 2013-11-03 MED ORDER — POTASSIUM CHLORIDE CRYS ER 20 MEQ PO TBCR
40.0000 meq | EXTENDED_RELEASE_TABLET | Freq: Two times a day (BID) | ORAL | Status: DC
  Administered 2013-11-03 – 2013-11-04 (×2): 40 meq via ORAL
  Filled 2013-11-03 (×3): qty 2

## 2013-11-03 NOTE — Progress Notes (Signed)
5 Days Post-Op  Subjective: Feels better. Had 2 bowel movements. Tolerating diet. Ambulating in the room but not in the hall. Foley catheter remains in place.Urine output 2000 450 cc just today. J-P drainage increased to 185 cc per 24 hours. Thin, watery, serious odorless. Creatinine down to 0.8. Potassium 3.0. Glucose 139. Albumin 2.0.  Objective: Vital signs in last 24 hours: Temp:  [97.6 F (36.4 C)-98.8 F (37.1 C)] 98.8 F (37.1 C) (08/30 0515) Pulse Rate:  [73-85] 85 (08/30 0515) Resp:  [15-18] 15 (08/30 0515) BP: (118-129)/(47-72) 118/47 mmHg (08/30 0515) SpO2:  [96 %-99 %] 97 % (08/30 0804) Last BM Date: 11/02/13  Intake/Output from previous day: 08/29 0701 - 08/30 0700 In: 480 [P.O.:480] Out: 2635 [Urine:2450; Drains:185] Intake/Output this shift: Total I/O In: -  Out: 80 [Drains:80]   EXAM: General appearance: alert. No distress. Sitting on the edge of bed. Family in room.  GI: obese. Soft. Wound clean. Bowel sounds present. JP drain serous, 185 cc last 24 hours. Picking up.  Lab Results:  Results for orders placed during the hospital encounter of 10/25/13 (from the past 24 hour(s))  BASIC METABOLIC PANEL     Status: Abnormal   Collection Time    11/02/13 10:47 AM      Result Value Ref Range   Sodium 134 (*) 137 - 147 mEq/L   Potassium 4.5  3.7 - 5.3 mEq/L   Chloride 101  96 - 112 mEq/L   CO2 23  19 - 32 mEq/L   Glucose, Bld 158 (*) 70 - 99 mg/dL   BUN 22  6 - 23 mg/dL   Creatinine, Ser 1.28 (*) 0.50 - 1.10 mg/dL   Calcium 8.1 (*) 8.4 - 10.5 mg/dL   GFR calc non Af Amer 40 (*) >90 mL/min   GFR calc Af Amer 46 (*) >90 mL/min   Anion gap 10  5 - 15  OSMOLALITY     Status: None   Collection Time    11/02/13  2:04 PM      Result Value Ref Range   Osmolality 283  275 - 300 mOsm/kg  RENAL FUNCTION PANEL     Status: Abnormal   Collection Time    11/03/13  4:30 AM      Result Value Ref Range   Sodium 136 (*) 137 - 147 mEq/L   Potassium 3.0 (*) 3.7 - 5.3  mEq/L   Chloride 103  96 - 112 mEq/L   CO2 22  19 - 32 mEq/L   Glucose, Bld 139 (*) 70 - 99 mg/dL   BUN 13  6 - 23 mg/dL   Creatinine, Ser 0.80  0.50 - 1.10 mg/dL   Calcium 8.0 (*) 8.4 - 10.5 mg/dL   Phosphorus 1.4 (*) 2.3 - 4.6 mg/dL   Albumin 2.0 (*) 3.5 - 5.2 g/dL   GFR calc non Af Amer 70 (*) >90 mL/min   GFR calc Af Amer 82 (*) >90 mL/min   Anion gap 11  5 - 15     Studies/Results: No results found.  Marland Kitchen albuterol  2.5 mg Nebulization TID  . antiseptic oral rinse  7 mL Mouth Rinse BID  . carvedilol  6.25 mg Oral BID WC  . enoxaparin (LOVENOX) injection  40 mg Subcutaneous Q24H  . guaiFENesin  600 mg Oral BID  . pantoprazole  40 mg Oral Daily  . potassium chloride  40 mEq Oral BID  . potassium chloride  40 mEq Oral BID  Assessment/Plan: s/p Procedure(s): OPEN REPAIR OF RECURRENT INCISIONAL HERNIA WITH MESH  S/P open repair recurrent incisional hernia with mesh---Dr. Grandville Silos  POD#5  Ileus resolved  -continue drain- Drainage increased last 48 hours, but still appears benign.  -mobilize  Home soon  [place]condiitoning - PT/OT   AKI- non-oliguric. 0.8 today . We really appreciate renal advice and management  Bmet repeat in am..   Hypokalemia. Will give K. Dur 20 mEq twice a day today for 2 doses. Check B. Met in am.  FEN - on  Solid diet  Resp failure/ATX - continue pulmonary toilet and BDs (250 only on IS)  VTE - Lovenox   _0 @  LOS: 9 days    [name] M 11/03/2013  . .prob

## 2013-11-03 NOTE — Progress Notes (Addendum)
I have personally seen and examined this patient and agree with the assessment/plan as outlined above by Heber Mantua DO (PGY2). Improving renal fucnrtion with excellent UOP and consequent hypokalemia----- ongoing potassium repletion and will give single dose IV lasix today for volume management. Resume amlodipine if hypertensive. Lisinopril/HCTZ can be resumed as an out-patient by PCP  Will sign off at this time, please call for further assistance in her care  Johnathan Heskett K.,MD 11/03/2013 10:41 AM

## 2013-11-03 NOTE — Progress Notes (Signed)
S:Daughter at bedside.  [name] has no complaints, they do note there has been a slightly increased amount of drainage from her drain.  O:BP 118/47  Pulse 85  Temp(Src) 98.8 F (37.1 C) (Oral)  Resp 15  Ht 5\' 1"  (1.549 m)  Wt 236 lb 12.4 oz (107.4 kg)  BMI 44.76 kg/m2  SpO2 97%  Intake/Output Summary (Last 24 hours) at 11/03/13 0945 Last data filed at 11/03/13 0729  Gross per 24 hour  Intake    360 ml  Output   2715 ml  Net  -2355 ml   Intake/Output: I/O last 3 completed shifts: In: 2505 [P.O.:720; I.V.:500] Out: 4230 [Urine:3950; Drains:280]  Intake/Output this shift:  Total I/O In: -  Out: 80 [Drains:80] Weight change:  LZJ:QBHALPF in bed in NAD CVS:RRR Resp:decreased BS B/L, mild bibasilar rales XTK:WIOX, non-tender, midline surgical scar with drain containing red tinged fluid Ext:+1 B/L edema, non-tender   Recent Labs Lab 10/28/13 0534 10/29/13 0725 10/31/13 0549 10/31/13 1435 11/01/13 0606 11/02/13 1047 11/03/13 0430  NA 135* 136* 135* 131* 132* 134* 136*  K 3.3* 3.5* 4.5 4.4 4.3 4.5 3.0*  CL 100 101 102 99 102 101 103  CO2 23 24 22  18* 18* 23 22  GLUCOSE 135* 144* 140* 123* 110* 158* 139*  BUN 49* 20 23 26* 34* 22 13  CREATININE 1.40* 0.81 2.06* 2.05* 2.26* 1.28* 0.80  ALBUMIN  --   --   --   --   --   --  2.0*  CALCIUM 7.9* 8.0* 7.9* 8.0* 7.6* 8.1* 8.0*  PHOS  --   --   --   --   --   --  1.4*   CBC:  Recent Labs Lab 10/28/13 0534 10/29/13 0725  WBC 7.3 5.4  HGB 10.6* 11.2*  HCT 32.6* 34.9*  MCV 81.9 82.3  PLT 220 244   Studies/Results: US Renal  11/01/2013   CLINICAL DATA:  78 year old female with acute on chronic renal disease and oliguria. Initial encounter.  EXAM: RENAL/URINARY TRACT ULTRASOUND COMPLETE  COMPARISON:  CT Abdomen and Pelvis 10/25/2013.  FINDINGS: Right Kidney:  Length: 9.4 cm. Echogenicity within normal limits. No mass or hydronephrosis visualized.  Left Kidney: Not well visualized in part due to abdominal dressing  material.  Length: Approximately 9.1 cm. No hydronephrosis is evident. Grossly symmetric cortical echogenicity, within normal limits.  Bladder:  Not visualized, reportedly Foley catheter in place.  IMPRESSION: No acute renal findings.   Electronically Signed   By: Lars Pinks M.D.   On: 11/01/2013 18:03   . albuterol  2.5 mg Nebulization TID  . antiseptic oral rinse  7 mL Mouth Rinse BID  . carvedilol  6.25 mg Oral BID WC  . enoxaparin (LOVENOX) injection  40 mg Subcutaneous Q24H  . guaiFENesin  600 mg Oral BID  . pantoprazole  40 mg Oral Daily  . potassium chloride  40 mEq Oral BID    BMET    Component Value Date/Time   NA 136* 11/03/2013 0430   K 3.0* 11/03/2013 0430   CL 103 11/03/2013 0430   CO2 22 11/03/2013 0430   GLUCOSE 139* 11/03/2013 0430   BUN 13 11/03/2013 0430   CREATININE 0.80 11/03/2013 0430   CREATININE 0.93 09/19/2013 0911   CALCIUM 8.0* 11/03/2013 0430   GFRNONAA 70* 11/03/2013 0430   GFRNONAA 60 09/19/2013 0911   GFRAA 82* 11/03/2013 0430   GFRAA 70 09/19/2013 0911   CBC    Component Value Date/Time  WBC 5.4 10/29/2013 0725   RBC 4.24 10/29/2013 0725   HGB 11.2* 10/29/2013 0725   HCT 34.9* 10/29/2013 0725   PLT 244 10/29/2013 0725   MCV 82.3 10/29/2013 0725   MCH 26.4 10/29/2013 0725   MCHC 32.1 10/29/2013 0725   RDW 14.0 10/29/2013 0725   LYMPHSABS 1.1 10/25/2013 1222   MONOABS 0.7 10/25/2013 1222   EOSABS 0.0 10/25/2013 1222   BASOSABS 0.0 10/25/2013 1222   Lab Results  Component Value Date   CREATININE 0.80 11/03/2013   CREATININE 1.28* 11/02/2013   CREATININE 2.26* 11/01/2013   Assessment/Plan:  1. Acute on CKD stage 2: Patient with Baseline SCr ~0.9 with previous history of some proteinuria. Her current acute kidney injury is likely secondary to post op hypotension in setting of ACEi, diuretic use causing ischemic ATN. FeNa of 1.5%. Renal ultrasound wnl.  Her SCr has improved today back to her baseline (0.8). She has continued to have good urine output with 2.4L out  yesterday. 1. Last BP 118/47, would slowly resume blood pressure medications as BP allows. 2. Hyponatremia: resolving with Na increase to 136 today which is consistent with her baseline. 3. HTN: BP currently 118/47 slowly resume BP meds as indicated. 4. Colonic Obstruction s/p hernia repair: doing well, plan per surgery. 5. Diarrhea - cdiff negative  Lucious Groves, DO  [place], [place] [phone] 11/03/2013, 9:52 AM

## 2013-11-04 LAB — RENAL FUNCTION PANEL
Albumin: 2.4 g/dL — ABNORMAL LOW (ref 3.5–5.2)
Anion gap: 14 (ref 5–15)
BUN: 7 mg/dL (ref 6–23)
CALCIUM: 8 mg/dL — AB (ref 8.4–10.5)
CO2: 22 meq/L (ref 19–32)
Chloride: 102 mEq/L (ref 96–112)
Creatinine, Ser: 0.79 mg/dL (ref 0.50–1.10)
GFR calc non Af Amer: 79 mL/min — ABNORMAL LOW (ref >90)
GLUCOSE: 144 mg/dL — AB (ref 70–99)
Phosphorus: 1.5 mg/dL — ABNORMAL LOW (ref 2.3–4.6)
Potassium: 3.1 mEq/L — ABNORMAL LOW (ref 3.7–5.3)
Sodium: 138 mEq/L (ref 137–147)

## 2013-11-04 MED ORDER — OXYCODONE HCL 10 MG PO TABS: 10.0000 mg | ORAL_TABLET | ORAL | Status: DC | PRN

## 2013-11-04 NOTE — Clinical Social Work Note (Addendum)
New plan is for patient to go home with home health.  CSW signing off.  Domenica Reamer, [place] Social Worker [phone]

## 2013-11-04 NOTE — Progress Notes (Signed)
Discharge order placed by NP.  Discharge instructions/prescription for oxycodone given and explained to pt and pt's daughter.  Both verbalized and demonstrated understanding of all orders/instructions.  JP drain handout given and explained to pt and daughter.  JP drainage cup given to pt. IV's removed.  VSS.  Rolling walker brought to pt. Pt instable condition for discharge home. Syliva Overman

## 2013-11-04 NOTE — Discharge Summary (Signed)
Physician Discharge Summary  Natalie DEGROOTE WCH:[id number] DOB: 08-May-1935 DOA: 10/25/2013  PCP: Aldine Contes, MD  Consultation: nephrology---Dr. Posey Pronto  Admit date: 10/25/2013 Discharge date: 11/04/2013  Recommendations for Outpatient Follow-up:   Follow-up Information   Follow up with [facility] E, MD. Schedule an appointment as soon as possible for a visit in 3 weeks.   Specialty:  General Surgery   Contact information:   [address] [place] [address] [phone]       Follow up with Aldine Contes, MD. (you will need to see you PCP to evaluate if you can be put back on lisinopril/hctz)    Specialty:  Internal Medicine   Contact information:   [address], [place] [address] [phone]      Discharge Diagnoses:  1. Recurrent incisional hernia 2. AKI 3. deconditioning    Surgical Procedure: open repair recurrent incisional hernia with mesh---Dr. Grandville Silos   Discharge Condition: stable Disposition: home  Diet recommendation: regular  Filed Weights   10/25/13 1127 10/25/13 1635  Weight: 220 lb (99.791 kg) 236 lb 12.4 oz (107.4 kg)    Filed Vitals:   11/04/13 1453  BP: 143/77  Pulse: 79  Temp: 98.2 F (36.8 C)  Resp: 18      Hospital Course:  Naidelin Gugliotta is a 78 year old female with a history of HTN, obesity, hypothyroidism, breast cancer, s/p lumpectomy, abdominal hysterectomy, hernia repair, GERD and asthma. She presents to [facility] with abdominal pain. A CT of abdomen and pelvis showed a bowel obstruction due to incarcerated ventral hernia, 2 herniations are noted involving the transverse colon, no free air. Normal lactic acid. White count of 15.2K. She was admitted and underwent the procedure listed above.  She had an increase in sCr and therefore her surgery was initially delayed.   Post operatively, she once again had an increase in sCr and nephrology was consulted after not responding to IV boluses.   She was  continued on IVF, k was supplemented and lasix challenge per nephrology.  AKI resolved.  Nephrology recommended OP follow up with PCP to determine when to resume hctz/lisniopril.  The patient was mobilized with therapies.  Diet was advanced.  On POD#6 she was felt stable for discharge.  The patient refused SNF instead opting to go home with her daughter.  Medication risks, benefits and therapeutic alternatives were reviewed with the patient.  She verbalizes understanding.   She is to follow up with Dr. Grandville Silos in 2-3 weeks for a post op check.  Encouraged to call with questions or concerns.      Discharge Instructions     Medication List    STOP taking these medications       Acetaminophen-Codeine 300-30 MG per tablet  Commonly known as:  TYLENOL/CODEINE #3     lisinopril-hydrochlorothiazide 20-25 MG per tablet  Commonly known as:  PRINZIDE,ZESTORETIC      TAKE these medications       albuterol 108 (90 BASE) MCG/ACT inhaler  Commonly known as:  PROVENTIL HFA;VENTOLIN HFA  Inhale 2 puffs into the lungs every 6 (six) hours as needed for wheezing.     amLODipine 5 MG tablet  Commonly known as:  NORVASC  Take 5 mg by mouth daily.     atorvastatin 40 MG tablet  Commonly known as:  LIPITOR  Take 1 tablet (40 mg total) by mouth daily.     calcium citrate-vitamin D 315-200 MG-UNIT per tablet  Commonly known as:  CITRACAL+D  Take 1 tablet by mouth 2 (two) times daily.     carvedilol 6.25 MG tablet  Commonly known as:  COREG  Take 1 tablet (6.25 mg total) by mouth 2 (two) times daily with a meal.     Oxycodone HCl 10 MG Tabs  Take 1-2 tablets (10-20 mg total) by mouth every 4 (four) hours as needed (pain).     pantoprazole 40 MG tablet  Commonly known as:  PROTONIX  Take 40 mg by mouth daily.     potassium chloride SA 20 MEQ tablet  Commonly known as:  K-DUR,KLOR-CON  Take 40 mEq by mouth daily.           Follow-up Information   Follow up with [facility] E, MD.  Schedule an appointment as soon as possible for a visit in 3 weeks.   Specialty:  General Surgery   Contact information:   [address] [place] [address] [phone]       Follow up with Aldine Contes, MD. (you will need to see you PCP to evaluate if you can be put back on lisinopril/hctz)    Specialty:  Internal Medicine   Contact information:   [place], [place] [address] [phone]        The results of significant diagnostics from this hospitalization (including imaging, microbiology, ancillary and laboratory) are listed below for reference.    Significant Diagnostic Studies: Dg Chest 2 View  10/25/2013   CLINICAL DATA:  Abdominal pain severe shortness of breath and nausea.  EXAM: CHEST  2 VIEW  COMPARISON:  PA and lateral chest x-ray of June 28, 2011  FINDINGS: The lungs are markedly hypoinflated. There is bibasilar atelectasis. There is an air and fluid level under the left hemidiaphragm which likely lies within the distended stomach. There is a loop of distended bowel in the right mid epigastric region that is relatively featureless. Definite free extraluminal gas is not demonstrated.  IMPRESSION: 1. There is marked hypo inflation with small amounts of bibasilar atelectasis. There is no CHF. 2. Distended stomach and presumably small bowel is consistent with obstruction. An acute abdominal series or CT scanning is recommended.   Electronically Signed   By: David  Martinique   On: 10/25/2013 13:23   Ct Abdomen Pelvis W Contrast  10/25/2013   CLINICAL DATA:  Diffuse abdominal pain with nausea and vomiting  EXAM: CT ABDOMEN AND PELVIS WITH CONTRAST  TECHNIQUE: Multidetector CT imaging of the abdomen and pelvis was performed using the standard protocol following bolus administration of intravenous contrast.  CONTRAST:  128mL OMNIPAQUE IOHEXOL 300 MG/ML  SOLN  COMPARISON:  None.  FINDINGS: There is mild atelectatic change in the left base. There  is elevation of the left hemidiaphragm.  No focal liver lesions are identified. Gallbladder wall is not thickened. There is no appreciable biliary duct dilatation.  Spleen, pancreas, and adrenals appear normal. Kidneys bilaterally show no evidence of mass or hydronephrosis. No renal or ureteral calculus on either side.  In the pelvis, the urinary bladder is midline with normal wall thickness. There is no pelvic mass or fluid collection. Uterus is absent. Appendix is appears normal.  There is a ventral hernia which contains loops of incarcerated large bowel. There is bowel obstruction involving the entire small bowel and the colon to the level of the transverse colon where the incarcerated bowel arises. There is no free air or portal venous air. The distal colon is decompressed. There  are sigmoid diverticula without diverticulitis.  There is no free air or portal venous air. There is no ascites, adenopathy, or abscess in the abdomen or pelvis. There is atherosclerotic change in both common iliac arteries. There is no appreciable abdominal aortic aneurysm. There is a total hip prosthesis on the right. There are no blastic or lytic bone lesions. There is extensive arthropathy in the lumbar spine.  IMPRESSION: There is bowel obstruction at the level of the mid transverse colon due to incarcerated loops of colon in a midline ventral hernia. There are actually 2 sites of herniation with incarceration in both regions which are essentially adjacent to each other involving the transverse colon region. There is diffuse bowel dilatation proximal to the loops of bowel which are incarcerated. No free air.  There are sigmoid diverticula without diverticulitis. Appendix region appears normal. No renal or ureteral calculus. No hydronephrosis.  Critical Value/emergent results were called by telephone at the time of interpretation on 10/25/2013 at 2:02 pm to Dr. Francine Graven , who verbally acknowledged these results.    Electronically Signed   By: Lowella Grip M.D.   On: 10/25/2013 14:04   US Renal  11/01/2013   CLINICAL DATA:  78 year old female with acute on chronic renal disease and oliguria. Initial encounter.  EXAM: RENAL/URINARY TRACT ULTRASOUND COMPLETE  COMPARISON:  CT Abdomen and Pelvis 10/25/2013.  FINDINGS: Right Kidney:  Length: 9.4 cm. Echogenicity within normal limits. No mass or hydronephrosis visualized.  Left Kidney: Not well visualized in part due to abdominal dressing material.  Length: Approximately 9.1 cm. No hydronephrosis is evident. Grossly symmetric cortical echogenicity, within normal limits.  Bladder:  Not visualized, reportedly Foley catheter in place.  IMPRESSION: No acute renal findings.   Electronically Signed   By: Lars Pinks M.D.   On: 11/01/2013 18:03   Dg Chest Port 1 View  10/27/2013   CLINICAL DATA:  Shortness of breath.  EXAM: PORTABLE CHEST - 1 VIEW  COMPARISON:  10/25/2013  FINDINGS: Shallow inspiration with elevation of right and left hemidiaphragms. Atelectasis in both lung bases. Heart size and pulmonary vascularity appear normal. No pneumothorax. Calcification of the aorta. Degenerative changes in the right shoulder and spine.  IMPRESSION: Shallow inspiration with atelectasis in the lung bases similar to previous study.   Electronically Signed   By: Lucienne Capers M.D.   On: 10/27/2013 23:30    Microbiology: Recent Results (from the past 240 hour(s))  URINE CULTURE     Status: None   Collection Time    10/25/13 12:10 PM      Result Value Ref Range Status   Specimen Description URINE, CLEAN CATCH   Final   Special Requests NONE   Final   Culture  Setup Time     Final   Value: 10/25/2013 12:30     Performed at [facility] Count     Final   Value: >=100,000 COLONIES/ML     Performed at [facility]   Culture     Final   Value: DIPHTHEROIDS(CORYNEBACTERIUM SPECIES)     Note: Standardized susceptibility testing for this organism is not  available.     Performed at [facility]   Report Status 10/26/2013 FINAL   Final  SURGICAL PCR SCREEN     Status: None   Collection Time    10/29/13 12:19 PM      Result Value Ref Range Status   MRSA, PCR NEGATIVE  NEGATIVE Final   Staphylococcus  aureus NEGATIVE  NEGATIVE Final   Comment:            The Xpert SA Assay (FDA     approved for NASAL specimens     in patients over 2 years of age),     is one component of     a comprehensive surveillance     program.  Test performance has     been validated by [employer] for patients greater     than or equal to 24 year old.     It is not intended     to diagnose infection nor to     guide or monitor treatment.  URINE CULTURE     Status: None   Collection Time    10/31/13 11:20 AM      Result Value Ref Range Status   Specimen Description URINE, RANDOM   Final   Special Requests ADDED AT 0258 ON [id number]   Final   Culture  Setup Time     Final   Value: 10/31/2013 22:35     Performed at [place]     Final   Value: NO GROWTH     Performed at [facility]   Culture     Final   Value: NO GROWTH     Performed at [facility]   Report Status 11/01/2013 FINAL   Final  CLOSTRIDIUM DIFFICILE BY PCR     Status: None   Collection Time    11/02/13  5:45 AM      Result Value Ref Range Status   C difficile by pcr NEGATIVE  NEGATIVE Final     Labs: Basic Metabolic Panel:  Recent Labs Lab 10/31/13 0549 10/31/13 1435 11/01/13 0606 11/02/13 1047 11/03/13 0430  NA 135* 131* 132* 134* 136*  K 4.5 4.4 4.3 4.5 3.0*  CL 102 99 102 101 103  CO2 22 18* 18* 23 22  GLUCOSE 140* 123* 110* 158* 139*  BUN 23 26* 34* 22 13  CREATININE 2.06* 2.05* 2.26* 1.28* 0.80  CALCIUM 7.9* 8.0* 7.6* 8.1* 8.0*  PHOS  --   --   --   --  1.4*   Liver Function Tests:  Recent Labs Lab 11/03/13 0430  ALBUMIN 2.0*   No results found for this basename: LIPASE, AMYLASE,  in the last 168 hours No  results found for this basename: AMMONIA,  in the last 168 hours CBC:  Recent Labs Lab 10/29/13 0725  WBC 5.4  HGB 11.2*  HCT 34.9*  MCV 82.3  PLT 244   Cardiac Enzymes: No results found for this basename: CKTOTAL, CKMB, CKMBINDEX, TROPONINI,  in the last 168 hours BNP: BNP (last 3 results) No results found for this basename: PROBNP,  in the last 8760 hours CBG: No results found for this basename: GLUCAP,  in the last 168 hours  Active Problems:   Colonic obstruction   Time coordinating discharge: <30 mins  Signed:  Emi Lymon, ANP-BC

## 2013-11-04 NOTE — Progress Notes (Signed)
6 Days Post-Op  Subjective: Doing well. Denies n/v. +ambulation. Multiple Bms. Tolerating diet,. High JP output  Objective: Vital signs in last 24 hours: Temp:  [97.7 F (36.5 C)-98.7 F (37.1 C)] 98.2 F (36.8 C) (08/31 0553) Pulse Rate:  [68-83] 83 (08/31 0553) Resp:  [16-18] 16 (08/31 0553) BP: (134-138)/(59-72) 137/59 mmHg (08/31 0553) SpO2:  [96 %-100 %] 97 % (08/31 0553) Last BM Date: 11/03/13  Intake/Output from previous day: 08/30 0701 - 08/31 0700 In: 410 [P.O.:410] Out: 3095 [Urine:2300; Drains:795] Intake/Output this shift:   Alert, nad, morbidly obese cta b/l Obese, soft, incision c/d/i; JP - serosang. Mild TTP No edema   Lab Results:  No results found for this basename: WBC, HGB, HCT, PLT,  in the last 72 hours BMET  Recent Labs  11/02/13 1047 11/03/13 0430  NA 134* 136*  K 4.5 3.0*  CL 101 103  CO2 23 22  GLUCOSE 158* 139*  BUN 22 13  CREATININE 1.28* 0.80  CALCIUM 8.1* 8.0*   PT/INR No results found for this basename: LABPROT, INR,  in the last 72 hours ABG No results found for this basename: PHART, PCO2, PO2, HCO3,  in the last 72 hours  Studies/Results: No results found.  Anti-infectives: Anti-infectives   Start     Dose/Rate Route Frequency Ordered Stop   10/29/13 1300  [MAR Hold]  ceFAZolin (ANCEF) IVPB 2 g/50 mL premix     (On MAR Hold since 10/29/13 1252)   2 g 100 mL/hr over 30 Minutes Intravenous  Once 10/29/13 0910 10/29/13 1408      Assessment/Plan: s/p Procedure(s): OPEN REPAIR OF RECURRENT INCISIONAL HERNIA WITH MESH (N/A) S/P open repair recurrent incisional hernia with mesh---Dr. Grandville Silos  POD#6  Ileus resolved  -continue drain- heavy drainage, but still appears benign/serosangeous. Will go home with drain -mobilize   Decondiitoning - PT/OT  AKI- non-oliguric.Labs pending . We really appreciate renal advice and management   Hypokalemia.  B. Met Pending FEN - on Solid diet  Resp failure/ATX - continue pulmonary  toilet and BDs (250 only on IS)  VTE - Lovenox  Probably discharge today once labs are back and if are ok  Leighton Ruff. Redmond Pulling, MD, FACS General, Bariatric, & Minimally Invasive Surgery [facility], [place]   LOS: 10 days    Gayland Curry 11/04/2013

## 2013-11-04 NOTE — Discharge Instructions (Signed)

## 2013-11-04 NOTE — Progress Notes (Signed)
Physical Therapy Treatment Patient Details Name: [name] MRN: [id number] DOB: 06-Dec-1935 Today's Date: 11/04/2013    History of Present Illness [name] is a 78 y.o. Female with hx of HTN, obesity, hypothyroidism, breast cancer s/p lumpectomy, abdominal hysterectomy, hernia repair, GERD, and asthma. Pt admitted 10/25/13 with abdominal pain. CT of abdomen and pelvis showed bowel obstruction due to incarcerated ventral hernia. Pt s/p open repair of recurrent incisional hernia on 10/29/13.     PT Comments    Progressing well.  Education with pt and daughter completed.  They will manage well at home with RW  Follow Up Recommendations  Home health PT     Equipment Recommendations  Rolling walker with 5" wheels (youth RW)    Recommendations for Other Services       Precautions / Restrictions Precautions Precautions: Fall Precaution Comments: hernia repair with JP drain, monitor sats Restrictions Weight Bearing Restrictions: No    Mobility  Bed Mobility Overal bed mobility: Needs Assistance Bed Mobility: Sidelying to Sit;Sit to Sidelying   Sidelying to sit: Min guard     Sit to sidelying: Min assist General bed mobility comments: demo'd and cued for safe technique; needed assist for getting feet in the bed.  Transfers Overall transfer level: Needs assistance Equipment used: Rolling walker (2 wheeled) Transfers: Sit to/from Stand Sit to Stand: Min guard         General transfer comment: cues for hand placement frequently  Ambulation/Gait Ambulation/Gait assistance: Supervision Ambulation Distance (Feet): 140 Feet Assistive device: Rolling walker (2 wheeled) Gait Pattern/deviations: Step-through pattern Gait velocity: decreased   General Gait Details: generally steady with lighter use of the RW.   Stairs Stairs: Yes Stairs assistance: Min assist Stair Management: No rails;One rail Right;Step to pattern;Forwards Number of Stairs: 4 General stair comments:  Needs min assist for stability due to no rails  Wheelchair Mobility    Modified Rankin (Stroke Patients Only)       Balance Overall balance assessment: No apparent balance deficits (not formally assessed)   Sitting balance-Leahy Scale: Good       Standing balance-Leahy Scale: Fair Standing balance comment: static standing easily managed without assistive device                    Cognition Arousal/Alertness: Awake/alert Behavior During Therapy: WFL for tasks assessed/performed Overall Cognitive Status: Within Functional Limits for tasks assessed                      Exercises      General Comments        Pertinent Vitals/Pain Pain Assessment: No/denies pain    Home Living                      Prior Function            PT Goals (current goals can now be found in the care plan section) Acute Rehab PT Goals Patient Stated Goal: to get stronger PT Goal Formulation: With patient Time For Goal Achievement: 11/13/13 Potential to Achieve Goals: Good Progress towards PT goals: Progressing toward goals    Frequency  Min 3X/week    PT Plan Current plan remains appropriate    Co-evaluation             End of Session   Activity Tolerance: Patient tolerated treatment well Patient left: in chair;with call bell/phone within reach;with family/visitor present     Time: 1540-0867 PT Time Calculation (min):  36 min  Charges:  $Gait Training: 8-22 mins $Therapeutic Activity: 8-22 mins                    G Codes:      [name], Tessie Fass 11/04/2013, 10:43 AM 11/04/2013  Donnella Sham, PT [phone]  (pager)

## 2013-11-07 NOTE — Discharge Summary (Signed)
Janace Decker M. Erendida Wrenn, MD, FACS General, Bariatric, & Minimally Invasive Surgery [facility]  

## 2013-11-13 ENCOUNTER — Ambulatory Visit (INDEPENDENT_AMBULATORY_CARE_PROVIDER_SITE_OTHER): Payer: Commercial Managed Care - HMO | Admitting: Internal Medicine

## 2013-11-13 ENCOUNTER — Encounter: Payer: Self-pay | Admitting: Internal Medicine

## 2013-11-13 VITALS — BP 133/67 | HR 81 | Temp 98.3°F | Wt 237.0 lb

## 2013-11-13 DIAGNOSIS — K56609 Unspecified intestinal obstruction, unspecified as to partial versus complete obstruction: Secondary | ICD-10-CM

## 2013-11-13 DIAGNOSIS — I10 Essential (primary) hypertension: Secondary | ICD-10-CM

## 2013-11-13 LAB — COMPLETE METABOLIC PANEL WITH GFR
ALBUMIN: 3.2 g/dL — AB (ref 3.5–5.2)
ALT: 9 U/L (ref 0–35)
AST: 17 U/L (ref 0–37)
Alkaline Phosphatase: 52 U/L (ref 39–117)
BUN: 7 mg/dL (ref 6–23)
CHLORIDE: 100 meq/L (ref 96–112)
CO2: 30 mEq/L (ref 19–32)
Calcium: 8.7 mg/dL (ref 8.4–10.5)
Creat: 0.79 mg/dL (ref 0.50–1.10)
GFR, Est African American: 85 mL/min
GFR, Est Non African American: 73 mL/min
Glucose, Bld: 106 mg/dL — ABNORMAL HIGH (ref 70–99)
POTASSIUM: 4.6 meq/L (ref 3.5–5.3)
Sodium: 139 mEq/L (ref 135–145)
Total Bilirubin: 0.6 mg/dL (ref 0.2–1.2)
Total Protein: 6.3 g/dL (ref 6.0–8.3)

## 2013-11-13 NOTE — Assessment & Plan Note (Signed)
Colonic obstruction 2/2 to incisional hernia, seen on CT scan, pt has had previous episodes. Had surgery- 10/29/2013. Tolerated procedure well, but developed AKI which resolved prior to discharge. Incision appears to be healing, has drain still in place, not changed in ~24 hrs- 2 mls of fluid present in drain.   Plan- Appointment made to follow up with Gen surgery- this week.

## 2013-11-13 NOTE — Assessment & Plan Note (Addendum)
BP Readings from Last 3 Encounters:  11/13/13 133/67  11/04/13 143/77  11/04/13 143/77    Lab Results  Component Value Date   NA 138 11/04/2013   K 3.1* 11/04/2013   CREATININE 0.79 11/04/2013    Assessment: Blood pressure control:  Controlled. Progress toward BP goal:   At goal. Comments: Pt was supposed to stop taking lisnopril and HCTZ which were d/c prior to discharge due to [facility] which developed. AKI thought to be 2/2 hypotension during operation with ongoing lisinopril and HCTZ use for BP control. Pt was given lasix challenge for oliguria. AKI resolved prior to discharge.  Pt was supposed to resume meds pending follow up with PCP. Pt has been taking all her medications, including these- Lisinopril, HCTZ. Pt was also placed on KCL supplimentation for low K on admission- 3.1- 11/04/2013. Pt denies any vomiting, diarrhea, bleeding, poor Po intake or anything that would cause dehydration. Pt Albumin was also low- 2.4, resulted in falsely low Ca- 8.0.  Plan: Medications:  Cont current meds- as pt has already been taking them. Other plans: Bmet today, determine if pt needs to cont K. Also check Cr- ensure its at baseline.  -Encouraged to Improve diet- Can take ensure, this will help with wound healing also.

## 2013-11-13 NOTE — Progress Notes (Signed)
Patient ID: [name], female   DOB: 1935-04-01, 78 y.o.   MRN: [id number]   Subjective:   Patient ID: [name] female   DOB: 04/24/1935 78 y.o.   MRN: [id number]  HPI: [name] is a 78 y.o. with PMH of HTN, Obesity, hx of breast cancer- s/p lumpectomy and OA presented today for hospital follow up. Pt was admitted 10/25/2013 for recurrent incisional hernia, had surgery 10/29/2013 and was discharged 11/04/2013. Pt hosp course was complicated by ischemic ATN- Hypotension during admission with ongoing lisinopril and HCTZ use. Cr was elevated to 2.26, but trended down to 0.79 by discharge. Pt denies any tenderness, drainage or fever. Pt has been having normal bowel habits, no blood, no vomiting, has good appetite.  Pt denies any cough, SOB, or leg swelling or pain. Has been ambulating at home.    Pt has not followed with surgeon since discharge.  Past Medical History  Diagnosis Date  . Hypothyroidism   . Lumbago   . Hyperlipidemia   . Hx of breast cancer   . Allergic rhinitis, cause unspecified   . Hypertension   . right shoulder pain, likely Impingement syndrome  09/09/2010  . OBESITY NOS 03/20/2006    Qualifier: Diagnosis of  By: Tomasa Hosteller MD, Veronique D.   . history of Bilateral leg edema-likely amlodipine induced. 07/28/2011  . history of CAP (community acquired pneumonia) 07/14/2011  . GERD (gastroesophageal reflux disease) 05/19/2011  . OSTEOARTHRITIS 12/13/2005    Annotation: bilateral knees;right knee injection 6/05 Qualifier: Diagnosis of  By: Tomasa Hosteller MD, Veronique D.   . VENOUS INSUFFICIENCY, CHRONIC 03/20/2006    Qualifier: Diagnosis of  By: Tomasa Hosteller MD, Veronique D.   . history of HYPOTHYROIDISM, BORDERLINE 03/20/2006    Annotation: Asymptomatic, untreated Qualifier: Diagnosis of  By: Tomasa Hosteller MD, Edmon Crape.   . Chronic back pain   . Cancer   . Asthma    Current Outpatient Prescriptions  Medication Sig Dispense Refill  . albuterol (PROVENTIL HFA;VENTOLIN HFA) 108 (90 BASE)  MCG/ACT inhaler Inhale 2 puffs into the lungs every 6 (six) hours as needed for wheezing.  1 Inhaler  5  . amLODipine (NORVASC) 5 MG tablet Take 5 mg by mouth daily.      Marland Kitchen atorvastatin (LIPITOR) 40 MG tablet Take 1 tablet (40 mg total) by mouth daily.  90 tablet  4  . calcium citrate-vitamin D (CITRACAL+D) 315-200 MG-UNIT per tablet Take 1 tablet by mouth 2 (two) times daily.  120 tablet  3  . carvedilol (COREG) 6.25 MG tablet Take 1 tablet (6.25 mg total) by mouth 2 (two) times daily with a meal.  180 tablet  4  . oxyCODONE 10 MG TABS Take 1-2 tablets (10-20 mg total) by mouth every 4 (four) hours as needed (pain).  40 tablet  0  . pantoprazole (PROTONIX) 40 MG tablet Take 40 mg by mouth daily.      . potassium chloride SA (K-DUR,KLOR-CON) 20 MEQ tablet Take 40 mEq by mouth daily.       No current facility-administered medications for this visit.   Family History  Problem Relation Age of Onset  . Hypertension Mother   . Alzheimer's disease Mother   . Diabetes Sister    History   Social History  . Marital Status: Legally Separated    Spouse Name: N/A    Number of Children: N/A  . Years of Education: N/A   Occupational History  . not working    Social History Main  Topics  . Smoking status: Former Smoker    Quit date: 09/09/1958  . Smokeless tobacco: Never Used  . Alcohol Use: No  . Drug Use: No  . Sexual Activity: None   Other Topics Concern  . None   Social History Narrative   Lives with daughter.    Review of Systems: CONSTITUTIONAL- No Fever, weightloss, night sweat or change in appetite. SKIN- No Rash, colour changes or itching. HEAD- No Headache or dizziness. EYES- No Vision loss, pain, redness, double or blurred vision. EARS- No vertigo, hearing loss or ear discharge. Mouth/throat- No Sorethroat, dentures, or bleeding gums. RESPIRATORY- No Cough or SOB. CARDIAC- No Palpitations, DOE, PND or chest pain. GI- No nausea, vomiting, diarrhoea, constipation, abd  pain. URINARY- No Frequency, urgency, straining or dysuria. NEUROLOGIC- No Numbness, syncope, seizures or burning. [facility]- Denies depression or anxiety.  Objective:  Physical Exam: Filed Vitals:   11/13/13 1348  BP: 133/67  Pulse: 81  Temp: 98.3 F (36.8 C)  TempSrc: Oral  Weight: 237 lb (107.502 kg)  SpO2: 98%   GENERAL- alert, co-operative, pleasant elderly lady, appears as stated age, not in any distress. HEENT- Atraumatic, normocephalic, PERRL, EOMI, oral mucosa appears moist, neck supple. CARDIAC- RRR, systolic murmur- 3/6, no rubs or gallops. RESP- Moving equal volumes of air, and clear to auscultation bilaterally, no wheezes or crackles. ABDOMEN- Soft, obese, nontender, no guarding or rebound, midline incision- with metal sutures in place, mostly apposed and healed, small- ~0.5cm by 0.5cm area not apposed, with minimal serosanguinous drainage, no palpable masses or organomegaly, bowel sounds present. BACK- Normal curvature of the spine, No tenderness along the vertebrae, no CVA tenderness. NEURO- No obvious Cr N abnormality, strenght upper and lower extremities- intact. EXTREMITIES- pulse 2+, symmetric, trace pitting pedal edema- bilat, no calf tenderness. SKIN- Warm, dry, No rash or lesion. PSYCH- Normal mood and affect, appropriate thought content and speech.  Assessment & Plan:   The patient's case and plan of care was discussed with attending physician, Dr. Eppie Gibson.  Please see problem based charting for assessment and plan.

## 2013-11-13 NOTE — Patient Instructions (Addendum)
General Instructions: Continue taking your medications just as you have. You will be contacted about whether to continue taking the K-dur/or potassium suppliment.  Based on the results we get, I will let you know if you should come in for an earlier appointment.  Remember to keep your appointment to see the surgeon.   Please bring your medicines with you each time you come to clinic.  Medicines may include prescription medications, over-the-counter medications, herbal remedies, eye drops, vitamins, or other pills.

## 2013-11-14 LAB — URINALYSIS, ROUTINE W REFLEX MICROSCOPIC
BILIRUBIN URINE: NEGATIVE
GLUCOSE, UA: NEGATIVE mg/dL
Hgb urine dipstick: NEGATIVE
Ketones, ur: NEGATIVE mg/dL
Leukocytes, UA: NEGATIVE
Nitrite: NEGATIVE
Protein, ur: NEGATIVE mg/dL
Specific Gravity, Urine: 1.018 (ref 1.005–1.030)
Urobilinogen, UA: 1 mg/dL (ref 0.0–1.0)
pH: 8 (ref 5.0–8.0)

## 2013-11-14 NOTE — Progress Notes (Signed)
Case discussed with Dr. Denton Brick at the time of the visit.  We reviewed the resident's history and exam and pertinent patient test results.  I agree with the assessment, diagnosis and plan of care documented in the resident's note.

## 2013-11-20 ENCOUNTER — Encounter (INDEPENDENT_AMBULATORY_CARE_PROVIDER_SITE_OTHER): Payer: Commercial Managed Care - HMO | Admitting: Ophthalmology

## 2013-11-22 ENCOUNTER — Encounter (INDEPENDENT_AMBULATORY_CARE_PROVIDER_SITE_OTHER): Payer: Commercial Managed Care - HMO | Admitting: Ophthalmology

## 2013-11-22 DIAGNOSIS — H2510 Age-related nuclear cataract, unspecified eye: Secondary | ICD-10-CM

## 2013-11-22 DIAGNOSIS — H348392 Tributary (branch) retinal vein occlusion, unspecified eye, stable: Secondary | ICD-10-CM

## 2013-11-22 DIAGNOSIS — I10 Essential (primary) hypertension: Secondary | ICD-10-CM

## 2013-11-22 DIAGNOSIS — H35039 Hypertensive retinopathy, unspecified eye: Secondary | ICD-10-CM

## 2013-11-22 DIAGNOSIS — H43819 Vitreous degeneration, unspecified eye: Secondary | ICD-10-CM

## 2013-12-11 ENCOUNTER — Encounter (INDEPENDENT_AMBULATORY_CARE_PROVIDER_SITE_OTHER): Payer: Commercial Managed Care - HMO | Admitting: Ophthalmology

## 2013-12-11 DIAGNOSIS — H34832 Tributary (branch) retinal vein occlusion, left eye: Secondary | ICD-10-CM

## 2013-12-11 DIAGNOSIS — H43813 Vitreous degeneration, bilateral: Secondary | ICD-10-CM

## 2013-12-11 DIAGNOSIS — I10 Essential (primary) hypertension: Secondary | ICD-10-CM

## 2013-12-11 DIAGNOSIS — H35033 Hypertensive retinopathy, bilateral: Secondary | ICD-10-CM

## 2013-12-19 ENCOUNTER — Ambulatory Visit: Payer: Medicare HMO | Admitting: Internal Medicine

## 2014-01-04 IMAGING — CR DG CHEST 2V
2 series · 2 of 2 positions shown · non-contrast
Comparison: 12/16/2005

CLINICAL DATA: Cough, hypertension

CHEST - 2 VIEW

[w chest pa]
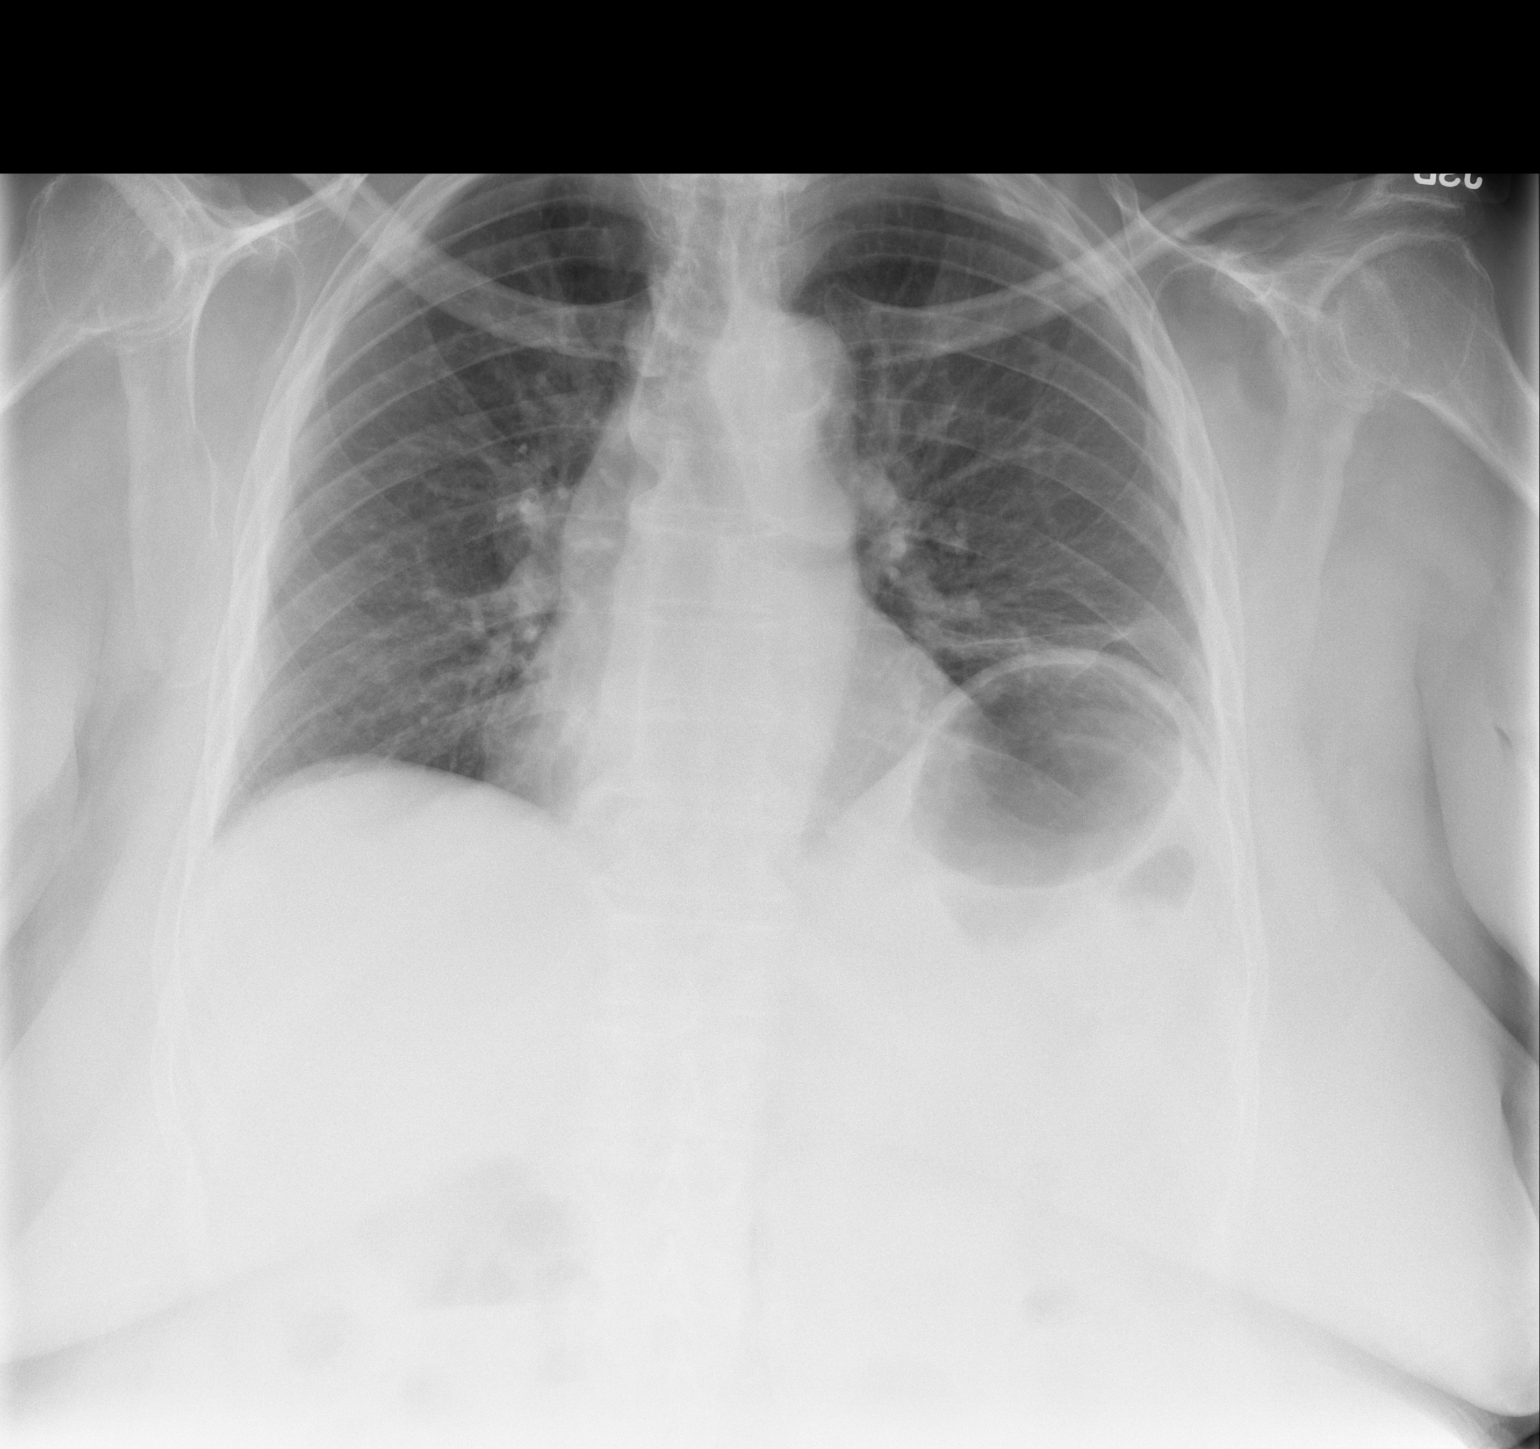

[w chest lat]
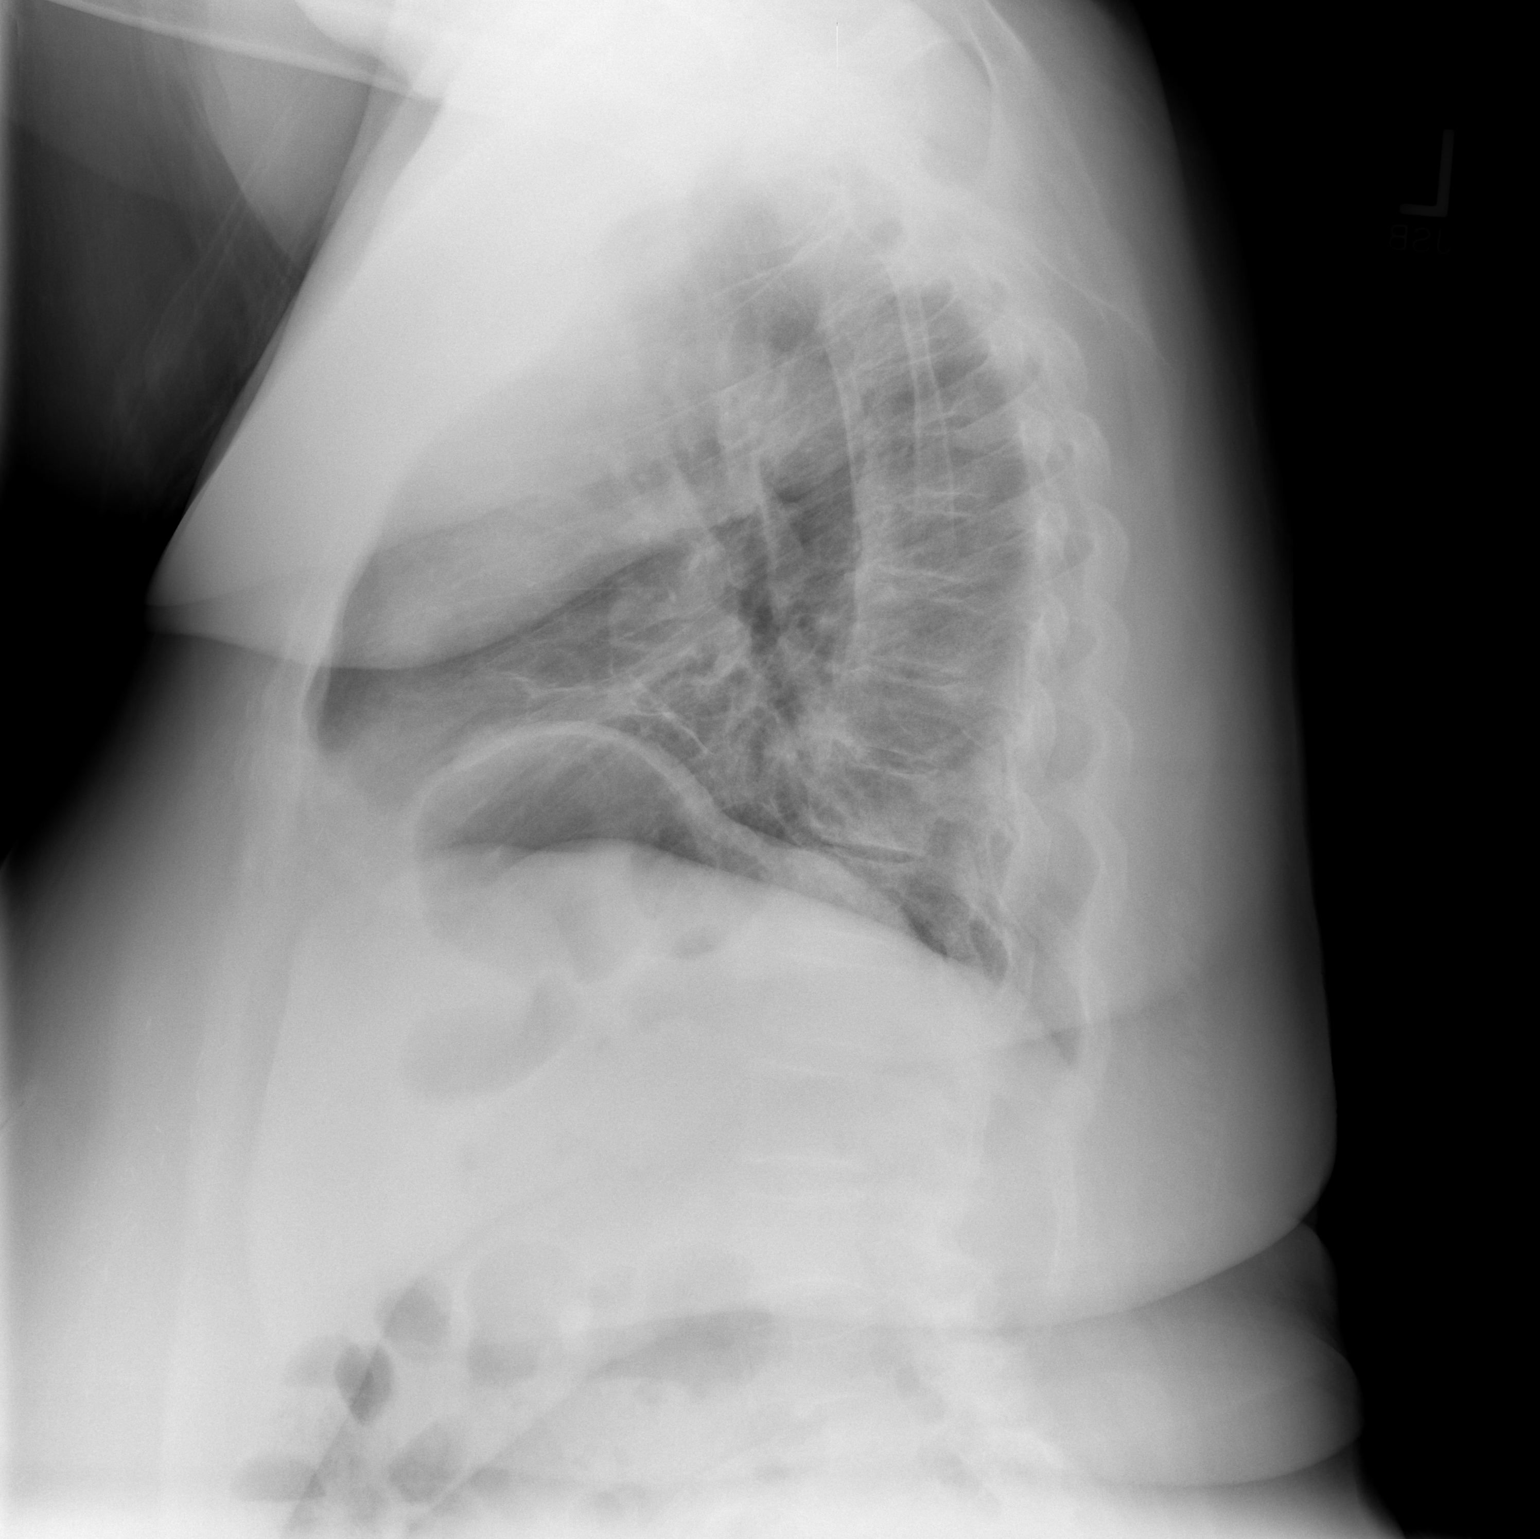

[2 of 2 positions shown; findings below may reference images not displayed]

FINDINGS: Chronic left base scarring and left hemidiaphragm
elevation.  Low lung volumes again noted.  No pneumonia, edema,
collapse, consolidation, effusion or pneumothorax.  Normal heart
size and vascularity.  Mild thoracic degenerative changes.  No
interval change.
IMPRESSION: Stable chest exam.  No superimposed acute process

## 2014-01-08 ENCOUNTER — Encounter (INDEPENDENT_AMBULATORY_CARE_PROVIDER_SITE_OTHER): Payer: Commercial Managed Care - HMO | Admitting: Ophthalmology

## 2014-01-09 ENCOUNTER — Encounter (INDEPENDENT_AMBULATORY_CARE_PROVIDER_SITE_OTHER): Payer: Commercial Managed Care - HMO | Admitting: Ophthalmology

## 2014-01-09 DIAGNOSIS — H35033 Hypertensive retinopathy, bilateral: Secondary | ICD-10-CM

## 2014-01-09 DIAGNOSIS — I10 Essential (primary) hypertension: Secondary | ICD-10-CM

## 2014-01-09 DIAGNOSIS — H43813 Vitreous degeneration, bilateral: Secondary | ICD-10-CM

## 2014-01-09 DIAGNOSIS — H34832 Tributary (branch) retinal vein occlusion, left eye: Secondary | ICD-10-CM

## 2014-02-03 ENCOUNTER — Other Ambulatory Visit: Payer: Self-pay | Admitting: *Deleted

## 2014-02-04 ENCOUNTER — Other Ambulatory Visit: Payer: Self-pay | Admitting: Internal Medicine

## 2014-02-04 MED ORDER — ACETAMINOPHEN-CODEINE #3 300-30 MG PO TABS: 1.0000 | ORAL_TABLET | ORAL | Status: DC | PRN

## 2014-02-04 NOTE — Telephone Encounter (Signed)
Pt states she needs a refill on Tylenol #3 - Rx was given 09/19/13 for 90 with 2 refills. Pt states she has problems with shoulder when weather changes. Hilda Blades Derin Matthes RN 02/04/14 3:45PM

## 2014-02-13 ENCOUNTER — Ambulatory Visit: Payer: Medicare HMO | Admitting: Internal Medicine

## 2014-02-19 ENCOUNTER — Encounter (INDEPENDENT_AMBULATORY_CARE_PROVIDER_SITE_OTHER): Payer: Commercial Managed Care - HMO | Admitting: Ophthalmology

## 2014-02-24 ENCOUNTER — Encounter (INDEPENDENT_AMBULATORY_CARE_PROVIDER_SITE_OTHER): Payer: Commercial Managed Care - HMO | Admitting: Ophthalmology

## 2014-02-24 DIAGNOSIS — I10 Essential (primary) hypertension: Secondary | ICD-10-CM

## 2014-02-24 DIAGNOSIS — H34832 Tributary (branch) retinal vein occlusion, left eye: Secondary | ICD-10-CM

## 2014-02-24 DIAGNOSIS — H35033 Hypertensive retinopathy, bilateral: Secondary | ICD-10-CM

## 2014-02-24 DIAGNOSIS — H4313 Vitreous hemorrhage, bilateral: Secondary | ICD-10-CM

## 2014-03-18 ENCOUNTER — Other Ambulatory Visit: Payer: Self-pay | Admitting: Internal Medicine

## 2014-03-18 DIAGNOSIS — H35033 Hypertensive retinopathy, bilateral: Secondary | ICD-10-CM

## 2014-04-07 ENCOUNTER — Encounter (INDEPENDENT_AMBULATORY_CARE_PROVIDER_SITE_OTHER): Payer: Commercial Managed Care - HMO | Admitting: Ophthalmology

## 2014-04-10 ENCOUNTER — Encounter (INDEPENDENT_AMBULATORY_CARE_PROVIDER_SITE_OTHER): Payer: Commercial Managed Care - HMO | Admitting: Ophthalmology

## 2014-04-10 DIAGNOSIS — I10 Essential (primary) hypertension: Secondary | ICD-10-CM | POA: Diagnosis not present

## 2014-04-10 DIAGNOSIS — H34832 Tributary (branch) retinal vein occlusion, left eye: Secondary | ICD-10-CM | POA: Diagnosis not present

## 2014-04-10 DIAGNOSIS — H35033 Hypertensive retinopathy, bilateral: Secondary | ICD-10-CM

## 2014-04-10 DIAGNOSIS — H43813 Vitreous degeneration, bilateral: Secondary | ICD-10-CM

## 2014-04-10 DIAGNOSIS — H2513 Age-related nuclear cataract, bilateral: Secondary | ICD-10-CM | POA: Diagnosis not present

## 2014-04-10 LAB — HM DIABETES EYE EXAM

## 2014-04-25 ENCOUNTER — Other Ambulatory Visit: Payer: Self-pay | Admitting: Internal Medicine

## 2014-04-25 DIAGNOSIS — Z853 Personal history of malignant neoplasm of breast: Secondary | ICD-10-CM

## 2014-05-02 ENCOUNTER — Ambulatory Visit
Admission: RE | Admit: 2014-05-02 | Discharge: 2014-05-02 | Disposition: A | Discharge status: Home / Self Care | Payer: Commercial Managed Care - HMO | Source: Ambulatory Visit | Attending: Internal Medicine | Admitting: Internal Medicine

## 2014-05-02 DIAGNOSIS — Z853 Personal history of malignant neoplasm of breast: Secondary | ICD-10-CM | POA: Diagnosis not present

## 2014-05-06 ENCOUNTER — Encounter: Payer: Self-pay | Admitting: *Deleted

## 2014-05-06 ENCOUNTER — Other Ambulatory Visit: Payer: Self-pay | Admitting: *Deleted

## 2014-05-07 MED ORDER — ACETAMINOPHEN-CODEINE #3 300-30 MG PO TABS: 1.0000 | ORAL_TABLET | ORAL | Status: DC | PRN

## 2014-05-09 ENCOUNTER — Other Ambulatory Visit: Payer: Self-pay | Admitting: Internal Medicine

## 2014-05-09 ENCOUNTER — Other Ambulatory Visit: Payer: Self-pay | Admitting: *Deleted

## 2014-05-09 NOTE — Telephone Encounter (Signed)
Already requested

## 2014-05-22 ENCOUNTER — Encounter (INDEPENDENT_AMBULATORY_CARE_PROVIDER_SITE_OTHER): Payer: Commercial Managed Care - HMO | Admitting: Ophthalmology

## 2014-05-22 DIAGNOSIS — H35033 Hypertensive retinopathy, bilateral: Secondary | ICD-10-CM | POA: Diagnosis not present

## 2014-05-22 DIAGNOSIS — H34832 Tributary (branch) retinal vein occlusion, left eye: Secondary | ICD-10-CM

## 2014-05-22 DIAGNOSIS — H43813 Vitreous degeneration, bilateral: Secondary | ICD-10-CM | POA: Diagnosis not present

## 2014-05-22 DIAGNOSIS — I10 Essential (primary) hypertension: Secondary | ICD-10-CM

## 2014-07-03 ENCOUNTER — Encounter (INDEPENDENT_AMBULATORY_CARE_PROVIDER_SITE_OTHER): Payer: Commercial Managed Care - HMO | Admitting: Ophthalmology

## 2014-07-03 DIAGNOSIS — H35033 Hypertensive retinopathy, bilateral: Secondary | ICD-10-CM | POA: Diagnosis not present

## 2014-07-03 DIAGNOSIS — H34832 Tributary (branch) retinal vein occlusion, left eye: Secondary | ICD-10-CM | POA: Diagnosis not present

## 2014-07-03 DIAGNOSIS — H43813 Vitreous degeneration, bilateral: Secondary | ICD-10-CM

## 2014-07-03 DIAGNOSIS — I10 Essential (primary) hypertension: Secondary | ICD-10-CM | POA: Diagnosis not present

## 2014-07-15 ENCOUNTER — Other Ambulatory Visit: Payer: Self-pay | Admitting: Internal Medicine

## 2014-07-15 DIAGNOSIS — H35039 Hypertensive retinopathy, unspecified eye: Secondary | ICD-10-CM

## 2014-07-19 ENCOUNTER — Other Ambulatory Visit: Payer: Self-pay | Admitting: Internal Medicine

## 2014-07-21 NOTE — Telephone Encounter (Signed)
Patient needs an appointment to determine ongoing need for K supplementation and for chronic follow up.  Will refill all except her KCl, as I am not sure she needs it, and on last check, her K was normal.    Will also only do a 1 time 3 month supply and have Dr. Dareen Piano evaluate further on his return.

## 2014-07-28 ENCOUNTER — Other Ambulatory Visit: Payer: Self-pay | Admitting: Internal Medicine

## 2014-08-05 DIAGNOSIS — H2513 Age-related nuclear cataract, bilateral: Secondary | ICD-10-CM | POA: Diagnosis not present

## 2014-08-05 DIAGNOSIS — H11013 Amyloid pterygium of eye, bilateral: Secondary | ICD-10-CM | POA: Diagnosis not present

## 2014-08-14 ENCOUNTER — Encounter (INDEPENDENT_AMBULATORY_CARE_PROVIDER_SITE_OTHER): Payer: Commercial Managed Care - HMO | Admitting: Ophthalmology

## 2014-08-18 ENCOUNTER — Encounter (INDEPENDENT_AMBULATORY_CARE_PROVIDER_SITE_OTHER): Payer: Commercial Managed Care - HMO | Admitting: Ophthalmology

## 2014-08-18 DIAGNOSIS — H43813 Vitreous degeneration, bilateral: Secondary | ICD-10-CM | POA: Diagnosis not present

## 2014-08-18 DIAGNOSIS — H2513 Age-related nuclear cataract, bilateral: Secondary | ICD-10-CM

## 2014-08-18 DIAGNOSIS — H34833 Tributary (branch) retinal vein occlusion, bilateral: Secondary | ICD-10-CM

## 2014-08-18 DIAGNOSIS — H35033 Hypertensive retinopathy, bilateral: Secondary | ICD-10-CM | POA: Diagnosis not present

## 2014-08-18 DIAGNOSIS — I10 Essential (primary) hypertension: Secondary | ICD-10-CM

## 2014-08-25 DIAGNOSIS — H11003 Unspecified pterygium of eye, bilateral: Secondary | ICD-10-CM | POA: Diagnosis not present

## 2014-08-25 DIAGNOSIS — H2513 Age-related nuclear cataract, bilateral: Secondary | ICD-10-CM | POA: Diagnosis not present

## 2014-08-25 DIAGNOSIS — H31092 Other chorioretinal scars, left eye: Secondary | ICD-10-CM | POA: Diagnosis not present

## 2014-08-25 DIAGNOSIS — H04123 Dry eye syndrome of bilateral lacrimal glands: Secondary | ICD-10-CM | POA: Diagnosis not present

## 2014-08-25 DIAGNOSIS — H52213 Irregular astigmatism, bilateral: Secondary | ICD-10-CM | POA: Diagnosis not present

## 2014-09-04 ENCOUNTER — Ambulatory Visit (INDEPENDENT_AMBULATORY_CARE_PROVIDER_SITE_OTHER): Payer: Commercial Managed Care - HMO | Admitting: Internal Medicine

## 2014-09-04 ENCOUNTER — Encounter: Payer: Self-pay | Admitting: Internal Medicine

## 2014-09-04 VITALS — BP 159/75 | HR 72 | Temp 98.2°F | Ht 61.0 in | Wt 244.4 lb

## 2014-09-04 DIAGNOSIS — I10 Essential (primary) hypertension: Secondary | ICD-10-CM | POA: Diagnosis not present

## 2014-09-04 DIAGNOSIS — Z23 Encounter for immunization: Secondary | ICD-10-CM | POA: Diagnosis not present

## 2014-09-04 DIAGNOSIS — R7303 Prediabetes: Secondary | ICD-10-CM

## 2014-09-04 DIAGNOSIS — R946 Abnormal results of thyroid function studies: Secondary | ICD-10-CM

## 2014-09-04 DIAGNOSIS — R7309 Other abnormal glucose: Secondary | ICD-10-CM | POA: Diagnosis not present

## 2014-09-04 DIAGNOSIS — Z0000 Encounter for general adult medical examination without abnormal findings: Secondary | ICD-10-CM | POA: Insufficient documentation

## 2014-09-04 DIAGNOSIS — E039 Hypothyroidism, unspecified: Secondary | ICD-10-CM

## 2014-09-04 DIAGNOSIS — E785 Hyperlipidemia, unspecified: Secondary | ICD-10-CM | POA: Diagnosis not present

## 2014-09-04 DIAGNOSIS — Z6841 Body Mass Index (BMI) 40.0 and over, adult: Secondary | ICD-10-CM

## 2014-09-04 DIAGNOSIS — E669 Obesity, unspecified: Secondary | ICD-10-CM

## 2014-09-04 HISTORY — DX: Encounter for general adult medical examination without abnormal findings: Z00.00

## 2014-09-04 LAB — LIPID PANEL
CHOL/HDL RATIO: 2.5 ratio
Cholesterol: 145 mg/dL (ref 0–200)
HDL: 57 mg/dL (ref >=46)
LDL Cholesterol: 76 mg/dL (ref 0–99)
Triglycerides: 58 mg/dL (ref <150)
VLDL: 12 mg/dL (ref 0–40)

## 2014-09-04 LAB — BASIC METABOLIC PANEL WITH GFR
BUN: 19 mg/dL (ref 6–23)
CHLORIDE: 101 meq/L (ref 96–112)
CO2: 26 mEq/L (ref 19–32)
CREATININE: 0.97 mg/dL (ref 0.50–1.10)
Calcium: 9.1 mg/dL (ref 8.4–10.5)
GFR, EST NON AFRICAN AMERICAN: 57 mL/min — AB
GFR, Est African American: 66 mL/min
Glucose, Bld: 99 mg/dL (ref 70–99)
Potassium: 3.7 mEq/L (ref 3.5–5.3)
Sodium: 140 mEq/L (ref 135–145)

## 2014-09-04 LAB — POCT GLYCOSYLATED HEMOGLOBIN (HGB A1C): Hemoglobin A1C: 6.1

## 2014-09-04 LAB — TSH: TSH: 4.695 u[IU]/mL — ABNORMAL HIGH (ref 0.350–4.500)

## 2014-09-04 LAB — GLUCOSE, CAPILLARY: Glucose-Capillary: 97 mg/dL (ref 65–99)

## 2014-09-04 MED ORDER — PANTOPRAZOLE SODIUM 40 MG PO TBEC: DELAYED_RELEASE_TABLET | ORAL | Status: DC

## 2014-09-04 MED ORDER — ATORVASTATIN CALCIUM 40 MG PO TABS: 40.0000 mg | ORAL_TABLET | Freq: Every day | ORAL | Status: DC

## 2014-09-04 MED ORDER — LISINOPRIL-HYDROCHLOROTHIAZIDE 20-25 MG PO TABS: ORAL_TABLET | ORAL | Status: DC

## 2014-09-04 MED ORDER — CARVEDILOL 6.25 MG PO TABS: 6.2500 mg | ORAL_TABLET | Freq: Two times a day (BID) | ORAL | Status: DC

## 2014-09-04 MED ORDER — AMLODIPINE BESYLATE 5 MG PO TABS: ORAL_TABLET | ORAL | Status: DC

## 2014-09-04 MED ORDER — ACETAMINOPHEN-CODEINE #3 300-30 MG PO TABS: 1.0000 | ORAL_TABLET | ORAL | Status: DC | PRN

## 2014-09-04 MED ORDER — ALBUTEROL SULFATE HFA 108 (90 BASE) MCG/ACT IN AERS: 2.0000 | INHALATION_SPRAY | Freq: Four times a day (QID) | RESPIRATORY_TRACT | Status: DC | PRN

## 2014-09-04 MED ORDER — CALCIUM CITRATE-VITAMIN D 315-200 MG-UNIT PO TABS: 1.0000 | ORAL_TABLET | Freq: Two times a day (BID) | ORAL | Status: DC

## 2014-09-04 NOTE — Progress Notes (Signed)
   Subjective:    Patient ID: [name], female    DOB: 1935-03-14, 79 y.o.   MRN: [id number]  HPI Patient seen and examined. She is here for routine follow up of her diabetes and HTN. She feels well otherwise,  She still complains of intermittent lower back pain which is chronic and states that the tylenol 3 she takes improves her functionality and quality of life.   Review of Systems  Constitutional: Negative.   HENT: Negative.   Eyes: Negative.   Respiratory: Negative.   Cardiovascular: Negative.   Gastrointestinal: Negative.   Genitourinary: Negative.   Musculoskeletal: Positive for back pain. Negative for myalgias, joint swelling, arthralgias, gait problem and neck pain.  Skin: Negative.   Neurological: Negative.   Psychiatric/Behavioral: Negative.        Objective:   Physical Exam  Constitutional: She is oriented to person, place, and time. She appears well-developed and well-nourished.  HENT:  Head: Normocephalic and atraumatic.  Eyes: Conjunctivae are normal.  Neck: Normal range of motion. Neck supple.  Cardiovascular: Normal rate, regular rhythm and normal heart sounds.   Pulmonary/Chest: Effort normal and breath sounds normal. No respiratory distress. She has no wheezes.  Abdominal: Soft. Bowel sounds are normal. She exhibits no distension. There is no tenderness.  Musculoskeletal: Normal range of motion. She exhibits no edema.  Neurological: She is alert and oriented to person, place, and time.  Skin: Skin is warm and dry. No erythema.  Psychiatric: She has a normal mood and affect. Her behavior is normal.          Assessment & Plan:  Please see problem based charting for assessment and plan:

## 2014-09-04 NOTE — Assessment & Plan Note (Signed)
-   Will recheck TSH today. If wnl will not pursue any further w/u

## 2014-09-04 NOTE — Assessment & Plan Note (Addendum)
BP Readings from Last 3 Encounters:  09/04/14 159/75  11/13/13 133/67  11/04/13 143/77    Lab Results  Component Value Date   NA 139 11/13/2013   K 4.6 11/13/2013   CREATININE 0.79 11/13/2013    Assessment: Blood pressure control:  fair Progress toward BP goal:   deteriorated Comments: [atient did not take her meds this AM  Plan: Medications:  continue current medications Educational resources provided: brochure (denies) Self management tools provided: home blood pressure logbook Other plans: Will continue with current meds and follow up in 3 months. Will recheck BMP today

## 2014-09-04 NOTE — Assessment & Plan Note (Signed)
-   Patient given prevnar 13 today - She states she was unable to get the shingles vaccine as it was too expensive

## 2014-09-04 NOTE — Patient Instructions (Signed)
- It was a pleasure seeing you today - Please follow up in 3 months - We will give you the pneumonia vaccine today - We will also check your kidney function, thyroid function, cholesterol and A1C today - We will check a foot exam - Please try and lose some weight as we discussed. I think it will be very beneficial to you  Calorie Counting for Weight Loss Calories are energy you get from the things you eat and drink. Your body uses this energy to keep you going throughout the day. The number of calories you eat affects your weight. When you eat more calories than your body needs, your body stores the extra calories as fat. When you eat fewer calories than your body needs, your body burns fat to get the energy it needs. Calorie counting means keeping track of how many calories you eat and drink each day. If you make sure to eat fewer calories than your body needs, you should lose weight. In order for calorie counting to work, you will need to eat the number of calories that are right for you in a day to lose a healthy amount of weight per week. A healthy amount of weight to lose per week is usually 1-2 lb (0.5-0.9 kg). A dietitian can determine how many calories you need in a day and give you suggestions on how to reach your calorie goal.  WHAT IS MY MY PLAN? My goal is to have __________ calories per day.  If I have this many calories per day, I should lose around __________ pounds per week. WHAT DO I NEED TO KNOW ABOUT CALORIE COUNTING? In order to meet your daily calorie goal, you will need to:  Find out how many calories are in each food you would like to eat. Try to do this before you eat.  Decide how much of the food you can eat.  Write down what you ate and how many calories it had. Doing this is called keeping a food log. WHERE DO I FIND CALORIE INFORMATION? The number of calories in a food can be found on a Nutrition Facts label. Note that all the information on a label is based on a  specific serving of the food. If a food does not have a Nutrition Facts label, try to look up the calories online or ask your dietitian for help. HOW DO I DECIDE HOW MUCH TO EAT? To decide how much of the food you can eat, you will need to consider both the number of calories in one serving and the size of one serving. This information can be found on the Nutrition Facts label. If a food does not have a Nutrition Facts label, look up the information online or ask your dietitian for help. Remember that calories are listed per serving. If you choose to have more than one serving of a food, you will have to multiply the calories per serving by the amount of servings you plan to eat. For example, the label on a package of bread might say that a serving size is 1 slice and that there are 90 calories in a serving. If you eat 1 slice, you will have eaten 90 calories. If you eat 2 slices, you will have eaten 180 calories. HOW DO I KEEP A FOOD LOG? After each meal, record the following information in your food log:  What you ate.  How much of it you ate.  How many calories it had.  Then, add  up your calories. Keep your food log near you, such as in a small notebook in your pocket. Another option is to use a mobile app or website. Some programs will calculate calories for you and show you how many calories you have left each time you add an item to the log. WHAT ARE SOME CALORIE COUNTING TIPS?  Use your calories on foods and drinks that will fill you up and not leave you hungry. Some examples of this include foods like nuts and nut butters, vegetables, lean proteins, and high-fiber foods (more than 5 g fiber per serving).  Eat nutritious foods and avoid empty calories. Empty calories are calories you get from foods or beverages that do not have many nutrients, such as candy and soda. It is better to have a nutritious high-calorie food (such as an avocado) than a food with few nutrients (such as a bag of  chips).  Know how many calories are in the foods you eat most often. This way, you do not have to look up how many calories they have each time you eat them.  Look out for foods that may seem like low-calorie foods but are really high-calorie foods, such as baked goods, soda, and fat-free candy.  Pay attention to calories in drinks. Drinks such as sodas, specialty coffee drinks, alcohol, and juices have a lot of calories yet do not fill you up. Choose low-calorie drinks like water and diet drinks.  Focus your calorie counting efforts on higher calorie items. Logging the calories in a garden salad that contains only vegetables is less important than calculating the calories in a milk shake.  Find a way of tracking calories that works for you. Get creative. Most people who are successful find ways to keep track of how much they eat in a day, even if they do not count every calorie. WHAT ARE SOME PORTION CONTROL TIPS?  Know how many calories are in a serving. This will help you know how many servings of a certain food you can have.  Use a measuring cup to measure serving sizes. This is helpful when you start out. With time, you will be able to estimate serving sizes for some foods.  Take some time to put servings of different foods on your favorite plates, bowls, and cups so you know what a serving looks like.  Try not to eat straight from a bag or box. Doing this can lead to overeating. Put the amount you would like to eat in a cup or on a plate to make sure you are eating the right portion.  Use smaller plates, glasses, and bowls to prevent overeating. This is a quick and easy way to practice portion control. If your plate is smaller, less food can fit on it.  Try not to multitask while eating, such as watching TV or using your computer. If it is time to eat, sit down at a table and enjoy your food. Doing this will help you to start recognizing when you are full. It will also make you more  aware of what and how much you are eating. HOW CAN I CALORIE COUNT WHEN EATING OUT?  Ask for smaller portion sizes or child-sized portions.  Consider sharing an entree and sides instead of getting your own entree.  If you get your own entree, eat only half. Ask for a box at the beginning of your meal and put the rest of your entree in it so you are not tempted to eat  it.  Look for the calories on the menu. If calories are listed, choose the lower calorie options.  Choose dishes that include vegetables, fruits, whole grains, low-fat dairy products, and lean protein. Focusing on smart food choices from each of the 5 food groups can help you stay on track at restaurants.  Choose items that are boiled, broiled, grilled, or steamed.  Choose water, milk, unsweetened iced tea, or other drinks without added sugars. If you want an alcoholic beverage, choose a lower calorie option. For example, a regular margarita can have up to 700 calories and a glass of wine has around 150.  Stay away from items that are buttered, battered, fried, or served with cream sauce. Items labeled "crispy" are usually fried, unless stated otherwise.  Ask for dressings, sauces, and syrups on the side. These are usually very high in calories, so do not eat much of them.  Watch out for salads. Many people think salads are a healthy option, but this is often not the case. Many salads come with bacon, fried chicken, lots of cheese, fried chips, and dressing. All of these items have a lot of calories. If you want a salad, choose a garden salad and ask for grilled meats or steak. Ask for the dressing on the side, or ask for olive oil and vinegar or lemon to use as dressing.  Estimate how many servings of a food you are given. For example, a serving of cooked rice is  cup or about the size of half a tennis ball or one cupcake wrapper. Knowing serving sizes will help you be aware of how much food you are eating at restaurants. The  list below tells you how big or small some common portion sizes are based on everyday objects.  1 oz--4 stacked dice.  3 oz--1 deck of cards.  1 tsp--1 dice.  1 Tbsp-- a Ping-Pong ball.  2 Tbsp--1 Ping-Pong ball.   cup--1 tennis ball or 1 cupcake wrapper.  1 cup--1 baseball. Document Released: 02/21/2005 Document Revised: 07/08/2013 Document Reviewed: 12/27/2012 [facility] Patient Information 2015 [place], [place]. This information is not intended to replace advice given to you by your health care provider. Make sure you discuss any questions you have with your health care provider.

## 2014-09-04 NOTE — Assessment & Plan Note (Signed)
Lab Results  Component Value Date   HGBA1C 6.1 09/04/2014   HGBA1C 6.3* 09/19/2013   HGBA1C 6.1* 02/15/2013     Assessment: Diabetes control:   Well controlled Progress toward A1C goal:   at goal Comments: patient remains borderline  Plan: Medications:  patient is borderline DM and does not require meds at this time Home glucose monitoring: Frequency:   Timing:   Instruction/counseling given: discussed the need for weight loss Educational resources provided:   Self management tools provided:   Other plans: Will recheck A1C in 12 months. Extensive discussion held with patient about the importance of weight loss and diet and exercise

## 2014-09-04 NOTE — Assessment & Plan Note (Signed)
-   explained to patient the importance of diet and exercise and weight loss - Patient will attempt to cut down on desserts and sodas and walk more

## 2014-09-18 ENCOUNTER — Telehealth: Payer: Self-pay | Admitting: *Deleted

## 2014-09-18 DIAGNOSIS — E876 Hypokalemia: Secondary | ICD-10-CM

## 2014-09-18 NOTE — Telephone Encounter (Signed)
Can we ask her to come in to get her potassium cheked again next week? If it is normal she does not have to take it anymore. Thank you

## 2014-09-18 NOTE — Telephone Encounter (Signed)
Pt called and asked if she needs to take KCL.  It has been taken off her list and she wants to be sure about it.   Last K 3.7 on 6/30  Pt # [phone]

## 2014-09-18 NOTE — Telephone Encounter (Signed)
Pt called but no answer. Unable to leave message.  Will try again

## 2014-09-18 NOTE — Telephone Encounter (Signed)
Placed the order. Will call her with the results

## 2014-09-18 NOTE — Telephone Encounter (Signed)
Talked with pt and she will come in Tuesday for labs.  Please call her with the results so she will know if she can stop the KCL.  Please enter future labs for her.

## 2014-09-23 ENCOUNTER — Other Ambulatory Visit (INDEPENDENT_AMBULATORY_CARE_PROVIDER_SITE_OTHER): Payer: Commercial Managed Care - HMO

## 2014-09-23 DIAGNOSIS — E876 Hypokalemia: Secondary | ICD-10-CM

## 2014-09-23 LAB — BASIC METABOLIC PANEL WITH GFR
BUN: 20 mg/dL (ref 6–23)
CALCIUM: 9.2 mg/dL (ref 8.4–10.5)
CHLORIDE: 100 meq/L (ref 96–112)
CO2: 29 meq/L (ref 19–32)
Creat: 0.91 mg/dL (ref 0.50–1.10)
GFR, Est African American: 71 mL/min
GFR, Est Non African American: 61 mL/min
Glucose, Bld: 127 mg/dL — ABNORMAL HIGH (ref 70–99)
POTASSIUM: 3.5 meq/L (ref 3.5–5.3)
Sodium: 139 mEq/L (ref 135–145)

## 2014-09-29 ENCOUNTER — Encounter (INDEPENDENT_AMBULATORY_CARE_PROVIDER_SITE_OTHER): Payer: Commercial Managed Care - HMO | Admitting: Ophthalmology

## 2014-10-01 ENCOUNTER — Telehealth: Payer: Self-pay | Admitting: Internal Medicine

## 2014-10-01 NOTE — Telephone Encounter (Signed)
Patient requesting pain med refill

## 2014-10-06 ENCOUNTER — Other Ambulatory Visit: Payer: Self-pay | Admitting: *Deleted

## 2014-10-06 ENCOUNTER — Other Ambulatory Visit: Payer: Self-pay | Admitting: Internal Medicine

## 2014-10-06 DIAGNOSIS — H2513 Age-related nuclear cataract, bilateral: Secondary | ICD-10-CM | POA: Diagnosis not present

## 2014-10-06 DIAGNOSIS — H11001 Unspecified pterygium of right eye: Secondary | ICD-10-CM | POA: Diagnosis not present

## 2014-10-06 DIAGNOSIS — H11052 Peripheral pterygium, progressive, left eye: Secondary | ICD-10-CM | POA: Diagnosis not present

## 2014-10-06 MED ORDER — ACETAMINOPHEN-CODEINE #3 300-30 MG PO TABS: 1.0000 | ORAL_TABLET | ORAL | Status: DC | PRN

## 2014-10-06 NOTE — Telephone Encounter (Signed)
Rx called in 

## 2014-10-16 ENCOUNTER — Encounter (INDEPENDENT_AMBULATORY_CARE_PROVIDER_SITE_OTHER): Payer: Commercial Managed Care - HMO | Admitting: Ophthalmology

## 2014-10-16 DIAGNOSIS — H35033 Hypertensive retinopathy, bilateral: Secondary | ICD-10-CM

## 2014-10-16 DIAGNOSIS — I10 Essential (primary) hypertension: Secondary | ICD-10-CM | POA: Diagnosis not present

## 2014-10-16 DIAGNOSIS — H43813 Vitreous degeneration, bilateral: Secondary | ICD-10-CM

## 2014-10-16 DIAGNOSIS — H2513 Age-related nuclear cataract, bilateral: Secondary | ICD-10-CM | POA: Diagnosis not present

## 2014-10-16 DIAGNOSIS — H34832 Tributary (branch) retinal vein occlusion, left eye: Secondary | ICD-10-CM

## 2014-11-25 ENCOUNTER — Other Ambulatory Visit: Payer: Self-pay | Admitting: Internal Medicine

## 2014-11-25 ENCOUNTER — Encounter: Payer: Commercial Managed Care - HMO | Admitting: Internal Medicine

## 2014-11-25 MED ORDER — ACETAMINOPHEN-CODEINE #3 300-30 MG PO TABS: 1.0000 | ORAL_TABLET | ORAL | Status: DC | PRN

## 2014-11-25 NOTE — Telephone Encounter (Signed)
Called to pharmacy 

## 2014-11-25 NOTE — Telephone Encounter (Signed)
Pt called requesting tylenol 3 to be filled @ [facility].

## 2014-11-26 ENCOUNTER — Emergency Department (HOSPITAL_COMMUNITY)
Admission: EM | Admit: 2014-11-26 | Discharge: 2014-11-26 | Disposition: A | Discharge status: Still In Hospital | Payer: Commercial Managed Care - HMO | Attending: Emergency Medicine | Admitting: Emergency Medicine

## 2014-11-26 ENCOUNTER — Emergency Department (HOSPITAL_COMMUNITY): Payer: Commercial Managed Care - HMO | Admitting: Certified Registered Nurse Anesthetist

## 2014-11-26 ENCOUNTER — Encounter (HOSPITAL_COMMUNITY): Payer: Self-pay | Admitting: Emergency Medicine

## 2014-11-26 ENCOUNTER — Encounter (HOSPITAL_COMMUNITY)
Admission: EM | Disposition: A | Discharge status: Still In Hospital | Payer: Self-pay | Source: Home / Self Care | Attending: Emergency Medicine

## 2014-11-26 ENCOUNTER — Emergency Department (HOSPITAL_COMMUNITY): Payer: Commercial Managed Care - HMO

## 2014-11-26 DIAGNOSIS — Z96641 Presence of right artificial hip joint: Secondary | ICD-10-CM | POA: Diagnosis not present

## 2014-11-26 DIAGNOSIS — T84021A Dislocation of internal left hip prosthesis, initial encounter: Secondary | ICD-10-CM | POA: Diagnosis not present

## 2014-11-26 DIAGNOSIS — T148 Other injury of unspecified body region: Secondary | ICD-10-CM | POA: Diagnosis not present

## 2014-11-26 DIAGNOSIS — E785 Hyperlipidemia, unspecified: Secondary | ICD-10-CM | POA: Insufficient documentation

## 2014-11-26 DIAGNOSIS — W1830XA Fall on same level, unspecified, initial encounter: Secondary | ICD-10-CM | POA: Insufficient documentation

## 2014-11-26 DIAGNOSIS — Z87891 Personal history of nicotine dependence: Secondary | ICD-10-CM | POA: Insufficient documentation

## 2014-11-26 DIAGNOSIS — K219 Gastro-esophageal reflux disease without esophagitis: Secondary | ICD-10-CM | POA: Diagnosis not present

## 2014-11-26 DIAGNOSIS — M545 Low back pain: Secondary | ICD-10-CM | POA: Diagnosis not present

## 2014-11-26 DIAGNOSIS — Z853 Personal history of malignant neoplasm of breast: Secondary | ICD-10-CM | POA: Insufficient documentation

## 2014-11-26 DIAGNOSIS — J45909 Unspecified asthma, uncomplicated: Secondary | ICD-10-CM | POA: Insufficient documentation

## 2014-11-26 DIAGNOSIS — W19XXXA Unspecified fall, initial encounter: Secondary | ICD-10-CM | POA: Diagnosis not present

## 2014-11-26 DIAGNOSIS — E669 Obesity, unspecified: Secondary | ICD-10-CM | POA: Insufficient documentation

## 2014-11-26 DIAGNOSIS — T84020A Dislocation of internal right hip prosthesis, initial encounter: Secondary | ICD-10-CM | POA: Diagnosis not present

## 2014-11-26 DIAGNOSIS — I872 Venous insufficiency (chronic) (peripheral): Secondary | ICD-10-CM | POA: Diagnosis not present

## 2014-11-26 DIAGNOSIS — I10 Essential (primary) hypertension: Secondary | ICD-10-CM | POA: Insufficient documentation

## 2014-11-26 DIAGNOSIS — Y9389 Activity, other specified: Secondary | ICD-10-CM | POA: Diagnosis not present

## 2014-11-26 DIAGNOSIS — Y92009 Unspecified place in unspecified non-institutional (private) residence as the place of occurrence of the external cause: Secondary | ICD-10-CM | POA: Insufficient documentation

## 2014-11-26 DIAGNOSIS — E039 Hypothyroidism, unspecified: Secondary | ICD-10-CM | POA: Insufficient documentation

## 2014-11-26 DIAGNOSIS — M25551 Pain in right hip: Secondary | ICD-10-CM | POA: Diagnosis not present

## 2014-11-26 DIAGNOSIS — S73004A Unspecified dislocation of right hip, initial encounter: Secondary | ICD-10-CM

## 2014-11-26 HISTORY — PX: HIP CLOSED REDUCTION: SHX983

## 2014-11-26 LAB — CBC WITH DIFFERENTIAL/PLATELET
Basophils Absolute: 0 10*3/uL (ref 0.0–0.1)
Basophils Relative: 0 %
EOS PCT: 0 %
Eosinophils Absolute: 0 10*3/uL (ref 0.0–0.7)
HEMATOCRIT: 36.1 % (ref 36.0–46.0)
Hemoglobin: 11.8 g/dL — ABNORMAL LOW (ref 12.0–15.0)
LYMPHS ABS: 1.2 10*3/uL (ref 0.7–4.0)
LYMPHS PCT: 9 %
MCH: 26.9 pg (ref 26.0–34.0)
MCHC: 32.7 g/dL (ref 30.0–36.0)
MCV: 82.2 fL (ref 78.0–100.0)
MONO ABS: 0.6 10*3/uL (ref 0.1–1.0)
Monocytes Relative: 5 %
Neutro Abs: 11.3 10*3/uL — ABNORMAL HIGH (ref 1.7–7.7)
Neutrophils Relative %: 86 %
PLATELETS: 238 10*3/uL (ref 150–400)
RBC: 4.39 MIL/uL (ref 3.87–5.11)
RDW: 13.7 % (ref 11.5–15.5)
WBC: 13.1 10*3/uL — AB (ref 4.0–10.5)

## 2014-11-26 LAB — BASIC METABOLIC PANEL
Anion gap: 12 (ref 5–15)
BUN: 11 mg/dL (ref 6–20)
CHLORIDE: 95 mmol/L — AB (ref 101–111)
CO2: 30 mmol/L (ref 22–32)
Calcium: 8.9 mg/dL (ref 8.9–10.3)
Creatinine, Ser: 0.85 mg/dL (ref 0.44–1.00)
GFR calc Af Amer: >60 mL/min (ref >60)
GLUCOSE: 121 mg/dL — AB (ref 65–99)
POTASSIUM: 2.7 mmol/L — AB (ref 3.5–5.1)
Sodium: 137 mmol/L (ref 135–145)

## 2014-11-26 SURGERY — CLOSED REDUCTION, HIP
Anesthesia: General | Site: Hip | Laterality: Right

## 2014-11-26 MED ORDER — HYDROMORPHONE HCL 1 MG/ML IJ SOLN
0.5000 mg | Freq: Once | INTRAMUSCULAR | Status: AC
  Administered 2014-11-26: 0.5 mg via INTRAVENOUS
  Filled 2014-11-26: qty 1

## 2014-11-26 MED ORDER — FENTANYL CITRATE (PF) 100 MCG/2ML IJ SOLN: 25.0000 ug | INTRAMUSCULAR | Status: DC | PRN

## 2014-11-26 MED ORDER — ONDANSETRON HCL 4 MG/2ML IJ SOLN
4.0000 mg | Freq: Once | INTRAMUSCULAR | Status: DC | PRN
Start: 2014-11-26 — End: 2014-11-26

## 2014-11-26 MED ORDER — PROPOFOL 10 MG/ML IV BOLUS
INTRAVENOUS | Status: DC | PRN
  Administered 2014-11-26: 120 mg via INTRAVENOUS

## 2014-11-26 MED ORDER — ONDANSETRON HCL 4 MG/2ML IJ SOLN
INTRAMUSCULAR | Status: AC
  Filled 2014-11-26: qty 2

## 2014-11-26 MED ORDER — FENTANYL CITRATE (PF) 100 MCG/2ML IJ SOLN
50.0000 ug | Freq: Once | INTRAMUSCULAR | Status: AC
  Administered 2014-11-26: 50 ug via INTRAVENOUS
  Filled 2014-11-26: qty 2

## 2014-11-26 MED ORDER — OXYCODONE HCL 5 MG/5ML PO SOLN: 5.0000 mg | Freq: Once | ORAL | Status: AC | PRN

## 2014-11-26 MED ORDER — OXYCODONE HCL 5 MG PO TABS
5.0000 mg | ORAL_TABLET | Freq: Once | ORAL | Status: AC | PRN
  Administered 2014-11-26: 5 mg via ORAL

## 2014-11-26 MED ORDER — HYDROCODONE-ACETAMINOPHEN 5-325 MG PO TABS: 1.0000 | ORAL_TABLET | Freq: Four times a day (QID) | ORAL | Status: DC | PRN

## 2014-11-26 MED ORDER — LACTATED RINGERS IV SOLN
INTRAVENOUS | Status: DC
  Administered 2014-11-26: 17:00:00 via INTRAVENOUS

## 2014-11-26 MED ORDER — PROPOFOL 10 MG/ML IV BOLUS
INTRAVENOUS | Status: AC
  Filled 2014-11-26: qty 20

## 2014-11-26 MED ORDER — LACTATED RINGERS IV SOLN
INTRAVENOUS | Status: DC | PRN
  Administered 2014-11-26: 18:00:00 via INTRAVENOUS

## 2014-11-26 MED ORDER — FENTANYL CITRATE (PF) 100 MCG/2ML IJ SOLN
100.0000 ug | Freq: Once | INTRAMUSCULAR | Status: AC
  Administered 2014-11-26: 100 ug via INTRAVENOUS
  Filled 2014-11-26: qty 2

## 2014-11-26 MED ORDER — FENTANYL CITRATE (PF) 250 MCG/5ML IJ SOLN
INTRAMUSCULAR | Status: AC
  Filled 2014-11-26: qty 5

## 2014-11-26 MED ORDER — FENTANYL CITRATE (PF) 100 MCG/2ML IJ SOLN
INTRAMUSCULAR | Status: DC | PRN
  Administered 2014-11-26 (×2): 50 ug via INTRAVENOUS

## 2014-11-26 MED ORDER — KETAMINE HCL 10 MG/ML IJ SOLN
110.0000 mg | Freq: Once | INTRAMUSCULAR | Status: AC
  Administered 2014-11-26: 110 mg via INTRAVENOUS
  Filled 2014-11-26: qty 11

## 2014-11-26 MED ORDER — LIDOCAINE HCL (CARDIAC) 20 MG/ML IV SOLN
INTRAVENOUS | Status: AC
  Filled 2014-11-26: qty 5

## 2014-11-26 MED ORDER — PROPOFOL 10 MG/ML IV BOLUS
1.0000 mg/kg | Freq: Once | INTRAVENOUS | Status: DC
  Filled 2014-11-26: qty 20

## 2014-11-26 MED ORDER — OXYCODONE HCL 5 MG PO TABS
ORAL_TABLET | ORAL | Status: AC
  Filled 2014-11-26: qty 1

## 2014-11-26 MED ORDER — SUCCINYLCHOLINE CHLORIDE 20 MG/ML IJ SOLN
INTRAMUSCULAR | Status: DC | PRN
  Administered 2014-11-26: 140 mg via INTRAVENOUS

## 2014-11-26 NOTE — ED Notes (Signed)
OR called; pt to go within the hour.

## 2014-11-26 NOTE — Op Note (Signed)
11/26/2014  5:51 PM  PATIENT:  [name]    PRE-OPERATIVE DIAGNOSIS:  dislocated right hip  POST-OPERATIVE DIAGNOSIS:  Same  PROCEDURE:  CLOSED REDUCTIION RIGHT HIP  SURGEON:  Newt Minion, MD  PHYSICIAN ASSISTANT:None ANESTHESIA:   General  PREOPERATIVE INDICATIONS:  [name] is a  79 y.o. female with a diagnosis of dislocated right hip who failed conservative measures and elected for surgical management.    The risks benefits and alternatives were discussed with the patient preoperatively including but not limited to the risks of infection, bleeding, nerve injury, cardiopulmonary complications, the need for revision surgery, among others, and the patient was willing to proceed.  OPERATIVE IMPLANTS: None  OPERATIVE FINDINGS: Dislocated ASR total hip, 10 years status post total hip replacement  OPERATIVE PROCEDURE: Patient was brought to the operating room and underwent general and aesthetic. After adequate levels anesthesia obtained a timeout was called. Patient underwent closed reduction of the right hip. After reduction her right leg was slightly longer than the left and had full range of motion with flexion internal and external rotation no complicating features with range of motion. Patient was extubated taken to PACU in stable condition plan for discharge to home.

## 2014-11-26 NOTE — Sedation Documentation (Signed)
Attempting reduction

## 2014-11-26 NOTE — Sedation Documentation (Signed)
Unsuccessful at reducing hip. Paging ortho

## 2014-11-26 NOTE — Progress Notes (Signed)
Dr. Linna Caprice aware of potassium of 2.7. Placed pt on cardiac monitor, no new orders received.

## 2014-11-26 NOTE — ED Notes (Signed)
Attempted reduction of R hip without sedation, unsucessful.

## 2014-11-26 NOTE — H&P (Signed)
[name] is an 79 y.o. female.   Chief Complaint: Right hip pain with dislocated right total hip arthroplasty. HPI: Patient is a 79 year old woman who states she fell this morning sustaining a dislocation to her right hip. She is unable to reduce the hip she presented to the emergency room. Attempted reduction in the emergency room was unsuccessful and patient presents at this time for closed reduction right hip.  Past Medical History  Diagnosis Date  . Hypothyroidism   . Lumbago   . Hyperlipidemia   . Hx of breast cancer   . Allergic rhinitis, cause unspecified   . Hypertension   . right shoulder pain, likely Impingement syndrome  09/09/2010  . OBESITY NOS 03/20/2006    Qualifier: Diagnosis of  By: Tomasa Hosteller MD, Veronique D.   . history of Bilateral leg edema-likely amlodipine induced. 07/28/2011  . history of CAP (community acquired pneumonia) 07/14/2011  . GERD (gastroesophageal reflux disease) 05/19/2011  . OSTEOARTHRITIS 12/13/2005    Annotation: bilateral knees;right knee injection 6/05 Qualifier: Diagnosis of  By: Tomasa Hosteller MD, Veronique D.   . VENOUS INSUFFICIENCY, CHRONIC 03/20/2006    Qualifier: Diagnosis of  By: Tomasa Hosteller MD, Veronique D.   . history of HYPOTHYROIDISM, BORDERLINE 03/20/2006    Annotation: Asymptomatic, untreated Qualifier: Diagnosis of  By: Tomasa Hosteller MD, Edmon Crape.   . Chronic back pain   . Cancer   . Asthma     Past Surgical History  Procedure Laterality Date  . Total hip arthroplasty    . Breast lumpectomy    . Abdominal hysterectomy    . Hernia repair    . Incisional hernia repair N/A 10/29/2013    Procedure: OPEN REPAIR OF RECURRENT INCISIONAL HERNIA WITH MESH;  Surgeon: Zenovia Jarred, MD;  Location: [facility];  Service: General;  Laterality: N/A;    Family History  Problem Relation Age of Onset  . Hypertension Mother   . Alzheimer's disease Mother   . Diabetes Sister    Social History:  reports that she quit smoking about 56 years ago. She has never  used smokeless tobacco. She reports that she does not drink alcohol or use illicit drugs.  Allergies:  Allergies  Allergen Reactions  . Metoprolol Itching  . Other Rash    pickles     (Not in a hospital admission)  No results found for this or any previous visit (from the past 48 hour(s)). Dg Hip Unilat With Pelvis 2-3 Views Right  11/26/2014   CLINICAL DATA:  Recent fall with right hip pain, initial encounter  EXAM: DG HIP (WITH OR WITHOUT PELVIS) 2-3V RIGHT  COMPARISON:  None.  FINDINGS: There is superior and posterior dislocation of the right hip prosthesis with respect to the acetabular component. No acute fracture is noted. Some dystrophic calcifications are noted along the proximal femur.  IMPRESSION: Right hip prosthesis dislocation. No acute bony abnormality is noted.   Electronically Signed   By: Inez Catalina M.D.   On: 11/26/2014 11:09   Dg Femur, Min 2 Views Right  11/26/2014   CLINICAL DATA:  Recent fall with right hip pain, initial encounter  EXAM: RIGHT FEMUR 2 VIEWS  COMPARISON:  None.  FINDINGS: There is dislocation of the right femoral prosthesis superiorly. Dystrophic calcification is noted along the medial aspect of the proximal femur. Degenerative changes are noted in the knee joint without joint effusion. No other focal abnormality is seen.  IMPRESSION: Right hip prosthesis dislocation. No other acute abnormality is noted.  Electronically Signed   By: Inez Catalina M.D.   On: 11/26/2014 11:13    Review of Systems  All other systems reviewed and are negative.   Blood pressure 139/77, pulse 81, temperature 97.7 F (36.5 C), temperature source Oral, resp. rate 18, SpO2 100 %. Physical Exam  On examination patient's right lower extremity is shortened and flexed. Attempted range of motion is painful. Radiographs show a dislocated right total hip arthroplasty with Depew ASR components. Assessment/Plan Assessment: Dislocated Depew ASR total hip arthroplasty.  Plan: We'll  plan for closed reduction under anesthesia.  DUDA,MARCUS V 11/26/2014, 3:47 PM

## 2014-11-26 NOTE — ED Notes (Signed)
Patient transported to X-ray without distress.  

## 2014-11-26 NOTE — ED Notes (Signed)
Metal necklace and clothing given to family member

## 2014-11-26 NOTE — Transfer of Care (Signed)
Immediate Anesthesia Transfer of Care Note  Patient: [name]  Procedure(s) Performed: Procedure(s): CLOSED REDUCTIION RIGHT HIP (Right)  Patient Location: PACU  Anesthesia Type:General  Level of Consciousness: awake, alert  and oriented  Airway & Oxygen Therapy: Patient Spontanous Breathing and Patient connected to nasal cannula oxygen  Post-op Assessment: Report given to RN, Post -op Vital signs reviewed and stable and Patient moving all extremities  Post vital signs: Reviewed and stable  Last Vitals:  Filed Vitals:   11/26/14 1803  BP: 118/98  Pulse: 101  Temp:   Resp: 22    Complications: No apparent anesthesia complications

## 2014-11-26 NOTE — Anesthesia Preprocedure Evaluation (Signed)
Anesthesia Evaluation  Patient identified by MRN, date of birth, ID band Patient awake    Reviewed: Allergy & Precautions, NPO status , Patient's Chart, lab work & pertinent test results  Airway Mallampati: II  TM Distance: >3 FB Neck ROM: Full    Dental  (+) Edentulous Upper, Dental Advisory Given   Pulmonary former smoker,    breath sounds clear to auscultation       Cardiovascular hypertension,  Rhythm:Regular Rate:Normal     Neuro/Psych    GI/Hepatic   Endo/Other    Renal/GU      Musculoskeletal   Abdominal   Peds  Hematology   Anesthesia Other Findings   Reproductive/Obstetrics                             Anesthesia Physical Anesthesia Plan  ASA: III  Anesthesia Plan: General   Post-op Pain Management:    Induction: Intravenous  Airway Management Planned: LMA  Additional Equipment:   Intra-op Plan:   Post-operative Plan:   Informed Consent: I have reviewed the patients History and Physical, chart, labs and discussed the procedure including the risks, benefits and alternatives for the proposed anesthesia with the patient or authorized representative who has indicated his/her understanding and acceptance.   Dental advisory given  Plan Discussed with: CRNA and Anesthesiologist  Anesthesia Plan Comments: (Dislocated R. THR Hypertension Hypokalemia K-2.7 Obesity GERD  Plan GA with LMA  Roberts Gaudy   )        Anesthesia Quick Evaluation

## 2014-11-26 NOTE — ED Provider Notes (Signed)
CSN: [id number]     Arrival date & time 11/26/14  0945 History   First MD Initiated Contact with Patient 11/26/14 0945     Chief Complaint  Patient presents with  . Fall     (Consider location/radiation/quality/duration/timing/severity/associated sxs/prior Treatment) Patient is a 79 y.o. female presenting with fall. The history is provided by the patient.  Fall This is a new problem. The current episode started less than 1 hour ago. The problem occurs constantly. The problem has not changed since onset.Pertinent negatives include no chest pain, no headaches and no shortness of breath. Nothing aggravates the symptoms. Nothing relieves the symptoms. She has tried nothing for the symptoms. The treatment provided no relief.   79 yo F with a chief complaint of right hip dislocation. Patient has a history of multiple dislocations in the past and is usually able to relocate them without difficulty. Patient states today she was reaching up to grab something and felt it dislocate and then fell to the ground. Her fall was detected by a family member. Patient denies any head injury denies any back pain denies any other areas of injury. Patient attempted to relocate it but was unable at home.  Past Medical History  Diagnosis Date  . Hypothyroidism   . Lumbago   . Hyperlipidemia   . Hx of breast cancer   . Allergic rhinitis, cause unspecified   . Hypertension   . right shoulder pain, likely Impingement syndrome  09/09/2010  . OBESITY NOS 03/20/2006    Qualifier: Diagnosis of  By: Tomasa Hosteller MD, Veronique D.   . history of Bilateral leg edema-likely amlodipine induced. 07/28/2011  . history of CAP (community acquired pneumonia) 07/14/2011  . GERD (gastroesophageal reflux disease) 05/19/2011  . OSTEOARTHRITIS 12/13/2005    Annotation: bilateral knees;right knee injection 6/05 Qualifier: Diagnosis of  By: Tomasa Hosteller MD, Veronique D.   . VENOUS INSUFFICIENCY, CHRONIC 03/20/2006    Qualifier: Diagnosis of  By:  Tomasa Hosteller MD, Veronique D.   . history of HYPOTHYROIDISM, BORDERLINE 03/20/2006    Annotation: Asymptomatic, untreated Qualifier: Diagnosis of  By: Tomasa Hosteller MD, Edmon Crape.   . Chronic back pain   . Cancer   . Asthma    Past Surgical History  Procedure Laterality Date  . Total hip arthroplasty    . Breast lumpectomy    . Abdominal hysterectomy    . Hernia repair    . Incisional hernia repair N/A 10/29/2013    Procedure: OPEN REPAIR OF RECURRENT INCISIONAL HERNIA WITH MESH;  Surgeon: Zenovia Jarred, MD;  Location: [facility];  Service: General;  Laterality: N/A;   Family History  Problem Relation Age of Onset  . Hypertension Mother   . Alzheimer's disease Mother   . Diabetes Sister    Social History  Substance Use Topics  . Smoking status: Former Smoker    Quit date: 09/09/1958  . Smokeless tobacco: Never Used  . Alcohol Use: No   OB History    No data available     Review of Systems  Constitutional: Negative for fever and chills.  HENT: Negative for congestion and rhinorrhea.   Eyes: Negative for redness and visual disturbance.  Respiratory: Negative for shortness of breath and wheezing.   Cardiovascular: Negative for chest pain and palpitations.  Gastrointestinal: Negative for nausea and vomiting.  Genitourinary: Negative for dysuria and urgency.  Musculoskeletal: Positive for myalgias and arthralgias.  Skin: Negative for pallor and wound.  Neurological: Negative for dizziness and headaches.  Allergies  Metoprolol and Other  Home Medications   Prior to Admission medications   Medication Sig Start Date End Date Taking? Authorizing Provider  acetaminophen-codeine (TYLENOL #3) 300-30 MG per tablet Take 1 tablet by mouth every 4 (four) hours as needed for moderate pain. 11/25/14  Yes Nischal Narendra, MD  albuterol (PROVENTIL HFA;VENTOLIN HFA) 108 (90 BASE) MCG/ACT inhaler Inhale 2 puffs into the lungs every 6 (six) hours as needed for wheezing. 09/04/14 09/14/15 Yes  Nischal Narendra, MD  amLODipine (NORVASC) 5 MG tablet TAKE 1 TABLET ONE TIME DAILY 09/04/14  Yes Nischal Narendra, MD  atorvastatin (LIPITOR) 40 MG tablet Take 1 tablet (40 mg total) by mouth daily. 09/04/14  Yes Aldine Contes, MD  calcium citrate-vitamin D (CITRACAL+D) 315-200 MG-UNIT per tablet Take 1 tablet by mouth 2 (two) times daily. 09/04/14  Yes Nischal Dareen Piano, MD  carvedilol (COREG) 6.25 MG tablet Take 1 tablet (6.25 mg total) by mouth 2 (two) times daily with a meal. 09/04/14  Yes Nischal Narendra, MD  lisinopril-hydrochlorothiazide (PRINZIDE,ZESTORETIC) 20-25 MG per tablet TAKE 1 TABLET  DAILY 09/04/14  Yes Nischal Narendra, MD  pantoprazole (PROTONIX) 40 MG tablet TAKE 1 TABLET ONE TIME DAILY 09/04/14  Yes Nischal Narendra, MD   BP 147/82 mmHg  Pulse 83  Temp(Src) 97.7 F (36.5 C) (Oral)  Resp 22  SpO2 99% Physical Exam  Constitutional: She is oriented to person, place, and time. She appears well-developed and well-nourished. No distress.  HENT:  Head: Normocephalic and atraumatic.  Eyes: EOM are normal. Pupils are equal, round, and reactive to light.  Neck: Normal range of motion. Neck supple.  Cardiovascular: Normal rate and regular rhythm.  Exam reveals no gallop and no friction rub.   No murmur heard. Pulmonary/Chest: Effort normal. She has no wheezes. She has no rales.  Abdominal: Soft. She exhibits no distension. There is no tenderness. There is no rebound and no guarding.  Musculoskeletal: She exhibits tenderness. She exhibits no edema.  Mild tenderness about the right hip. Patient points to right mid femur area of most pain. Pulse motor and sensation intact distally  Neurological: She is alert and oriented to person, place, and time.  Skin: Skin is warm and dry. She is not diaphoretic.  Psychiatric: She has a normal mood and affect. Her behavior is normal.    ED Course  ORTHOPEDIC INJURY TREATMENT Date/Time: 11/26/2014 3:22 PM Performed by: Deno Etienne Authorized  by: Deno Etienne Consent: Verbal consent obtained. Written consent obtained. Risks and benefits: risks, benefits and alternatives were discussed Consent given by: patient Patient understanding: patient states understanding of the procedure being performed Patient consent: the patient's understanding of the procedure matches consent given Procedure consent: procedure consent matches procedure scheduled Imaging studies: imaging studies available Required items: required blood products, implants, devices, and special equipment available Patient identity confirmed: verbally with patient Time out: Immediately prior to procedure a "time out" was called to verify the correct patient, procedure, equipment, support staff and site/side marked as required. Injury location: hip Location details: right hip Injury type: dislocation Dislocation type: posterior Spontaneous dislocation: yes Prosthesis: yes Pre-procedure neurovascular assessment: neurovascularly intact Pre-procedure distal perfusion: normal Pre-procedure neurological function: normal Pre-procedure range of motion: normal Local anesthesia used: no Patient sedated: yes Sedation type: moderate (conscious) sedation Sedatives: ketamine Analgesia: fentanyl Manipulation performed: yes Reduction method: Bigelow technique (captain morgan) Reduction successful: no Post-procedure neurovascular assessment: post-procedure neurovascularly intact Post-procedure distal perfusion: normal Post-procedure neurological function: normal Post-procedure range of motion: normal Patient tolerance: Patient tolerated  the procedure well with no immediate complications Comments: Patient given 1/kg of ketamine. Had brief period of apnea lasting about 20 seconds. No noted hypoxia. No need for intervention.   (including critical care time) Labs Review Labs Reviewed - No data to display  Imaging Review Dg Hip Unilat With Pelvis 2-3 Views Right  11/26/2014    CLINICAL DATA:  Recent fall with right hip pain, initial encounter  EXAM: DG HIP (WITH OR WITHOUT PELVIS) 2-3V RIGHT  COMPARISON:  None.  FINDINGS: There is superior and posterior dislocation of the right hip prosthesis with respect to the acetabular component. No acute fracture is noted. Some dystrophic calcifications are noted along the proximal femur.  IMPRESSION: Right hip prosthesis dislocation. No acute bony abnormality is noted.   Electronically Signed   By: Inez Catalina M.D.   On: 11/26/2014 11:09   Dg Femur, Min 2 Views Right  11/26/2014   CLINICAL DATA:  Recent fall with right hip pain, initial encounter  EXAM: RIGHT FEMUR 2 VIEWS  COMPARISON:  None.  FINDINGS: There is dislocation of the right femoral prosthesis superiorly. Dystrophic calcification is noted along the medial aspect of the proximal femur. Degenerative changes are noted in the knee joint without joint effusion. No other focal abnormality is seen.  IMPRESSION: Right hip prosthesis dislocation. No other acute abnormality is noted.   Electronically Signed   By: Inez Catalina M.D.   On: 11/26/2014 11:13   I have personally reviewed and evaluated these images and lab results as part of my medical decision-making.   EKG Interpretation None      MDM   Final diagnoses:  Hip dislocation, right, initial encounter    79 yo F with right-sided hip pain. Pulse motor and sensation intact distally. Will x-ray.  Dislocated, spoke with Dr. Sharol Given on call for ortho.  Recommeded trial sedation and relocation.  Patient sedated with ketamine.   Unable to relocate at bedside. Spoke with Dr. Sharol Given, will sent to the OR.   The patients results and plan were reviewed and discussed.   Any x-rays performed were independently reviewed by myself.   Differential diagnosis were considered with the presenting HPI.  Medications  fentaNYL (SUBLIMAZE) injection 50 mcg (50 mcg Intravenous Given 11/26/14 1005)  fentaNYL (SUBLIMAZE) injection 50 mcg (50  mcg Intravenous Given 11/26/14 1115)  fentaNYL (SUBLIMAZE) injection 100 mcg (100 mcg Intravenous Given 11/26/14 1223)  ketamine (KETALAR) injection 110 mg (110 mg Intravenous Given by Other 11/26/14 1255)  HYDROmorphone (DILAUDID) injection 0.5 mg (0.5 mg Intravenous Given 11/26/14 1458)    Filed Vitals:   11/26/14 1338 11/26/14 1345 11/26/14 1400 11/26/14 1430  BP: 149/88 155/77 137/78 147/82  Pulse: 83 82 82 83  Temp:      TempSrc:      Resp: 18 19 24 22   SpO2: 100% 100% 100% 99%    Final diagnoses:  Hip dislocation, right, initial encounter    Admission/ observation were discussed with the admitting physician, patient and/or family and they are comfortable with the plan.      Deno Etienne, DO 11/26/14 1523

## 2014-11-26 NOTE — Anesthesia Procedure Notes (Signed)
Procedure Name: Intubation Date/Time: 11/26/2014 5:38 PM Performed by: Trixie Deis A Pre-anesthesia Checklist: Patient identified, Emergency Drugs available, Suction available, Patient being monitored and Timeout performed Patient Re-evaluated:Patient Re-evaluated prior to inductionOxygen Delivery Method: Circle system utilized Preoxygenation: Pre-oxygenation with 100% oxygen Intubation Type: IV induction Ventilation: Mask ventilation without difficulty Laryngoscope Size: Mac and 3 Grade View: Grade I Tube type: Oral Tube size: 8.0 mm Number of attempts: 1 Airway Equipment and Method: Stylet Placement Confirmation: ETT inserted through vocal cords under direct vision,  positive ETCO2 and breath sounds checked- equal and bilateral Secured at: 21 cm Tube secured with: Tape

## 2014-11-26 NOTE — Anesthesia Postprocedure Evaluation (Signed)
  Anesthesia Post-op Note  Patient: [name]  Procedure(s) Performed: Procedure(s): CLOSED REDUCTIION RIGHT HIP (Right)  Patient Location: PACU  Anesthesia Type:General  Level of Consciousness: awake, alert  and oriented  Airway and Oxygen Therapy: Patient Spontanous Breathing  Post-op Pain: none  Post-op Assessment: Post-op Vital signs reviewed, Patient's Cardiovascular Status Stable, Respiratory Function Stable, Patent Airway and Pain level controlled              Post-op Vital Signs: stable  Last Vitals:  Filed Vitals:   11/26/14 1835  BP: 136/84  Pulse: 99  Temp: 36.7 C  Resp: 19    Complications: No apparent anesthesia complications

## 2014-11-26 NOTE — ED Notes (Signed)
Pt reports she hasd surgery to replace hip years ago; was reaching up to get something on top shelf; felt R hip dislocate and fell onto carpeted floor. Did not hit head of LOC. Good pedal pulses; cannot fully extend leg; happens and she is usually able to pop back in but not today. Achy but appears to not be any amount of distress. Worse with movement.

## 2014-11-27 ENCOUNTER — Encounter (INDEPENDENT_AMBULATORY_CARE_PROVIDER_SITE_OTHER): Payer: Commercial Managed Care - HMO | Admitting: Ophthalmology

## 2014-11-27 ENCOUNTER — Encounter (HOSPITAL_COMMUNITY): Payer: Self-pay | Admitting: Orthopedic Surgery

## 2014-12-04 ENCOUNTER — Encounter (INDEPENDENT_AMBULATORY_CARE_PROVIDER_SITE_OTHER): Payer: Commercial Managed Care - HMO | Admitting: Ophthalmology

## 2014-12-04 DIAGNOSIS — I10 Essential (primary) hypertension: Secondary | ICD-10-CM | POA: Diagnosis not present

## 2014-12-04 DIAGNOSIS — H43813 Vitreous degeneration, bilateral: Secondary | ICD-10-CM | POA: Diagnosis not present

## 2014-12-04 DIAGNOSIS — H35033 Hypertensive retinopathy, bilateral: Secondary | ICD-10-CM

## 2014-12-04 DIAGNOSIS — H34832 Tributary (branch) retinal vein occlusion, left eye: Secondary | ICD-10-CM

## 2014-12-08 ENCOUNTER — Other Ambulatory Visit: Payer: Self-pay | Admitting: Internal Medicine

## 2014-12-09 ENCOUNTER — Encounter: Payer: Self-pay | Admitting: Internal Medicine

## 2014-12-09 ENCOUNTER — Other Ambulatory Visit: Payer: Self-pay | Admitting: Internal Medicine

## 2014-12-09 ENCOUNTER — Encounter: Payer: Commercial Managed Care - HMO | Admitting: Internal Medicine

## 2014-12-09 ENCOUNTER — Ambulatory Visit (INDEPENDENT_AMBULATORY_CARE_PROVIDER_SITE_OTHER): Payer: Commercial Managed Care - HMO | Admitting: Internal Medicine

## 2014-12-09 VITALS — BP 128/72 | HR 74 | Temp 98.0°F

## 2014-12-09 DIAGNOSIS — Z23 Encounter for immunization: Secondary | ICD-10-CM | POA: Insufficient documentation

## 2014-12-09 DIAGNOSIS — I10 Essential (primary) hypertension: Secondary | ICD-10-CM | POA: Diagnosis not present

## 2014-12-09 DIAGNOSIS — R7303 Prediabetes: Secondary | ICD-10-CM

## 2014-12-09 DIAGNOSIS — S73004D Unspecified dislocation of right hip, subsequent encounter: Secondary | ICD-10-CM

## 2014-12-09 DIAGNOSIS — E876 Hypokalemia: Secondary | ICD-10-CM | POA: Diagnosis not present

## 2014-12-09 DIAGNOSIS — X58XXXD Exposure to other specified factors, subsequent encounter: Secondary | ICD-10-CM

## 2014-12-09 DIAGNOSIS — S73006A Unspecified dislocation of unspecified hip, initial encounter: Secondary | ICD-10-CM | POA: Insufficient documentation

## 2014-12-09 LAB — BASIC METABOLIC PANEL
ANION GAP: 12 (ref 5–15)
BUN: 14 mg/dL (ref 6–20)
CO2: 30 mmol/L (ref 22–32)
Calcium: 9.5 mg/dL (ref 8.9–10.3)
Chloride: 98 mmol/L — ABNORMAL LOW (ref 101–111)
Creatinine, Ser: 0.87 mg/dL (ref 0.44–1.00)
GFR calc Af Amer: >60 mL/min (ref >60)
GFR calc non Af Amer: >60 mL/min (ref >60)
GLUCOSE: 109 mg/dL — AB (ref 65–99)
POTASSIUM: 2.8 mmol/L — AB (ref 3.5–5.1)
Sodium: 140 mmol/L (ref 135–145)

## 2014-12-09 MED ORDER — ACETAMINOPHEN-CODEINE #3 300-30 MG PO TABS: 1.0000 | ORAL_TABLET | ORAL | Status: DC | PRN

## 2014-12-09 MED ORDER — POTASSIUM CHLORIDE CRYS ER 20 MEQ PO TBCR: 40.0000 meq | EXTENDED_RELEASE_TABLET | Freq: Once | ORAL | Status: DC

## 2014-12-09 MED ORDER — ALBUTEROL SULFATE HFA 108 (90 BASE) MCG/ACT IN AERS: 2.0000 | INHALATION_SPRAY | Freq: Four times a day (QID) | RESPIRATORY_TRACT | Status: DC | PRN

## 2014-12-09 MED ORDER — DICLOFENAC SODIUM 1 % TD GEL: 2.0000 g | TRANSDERMAL | Status: DC | PRN

## 2014-12-09 NOTE — Assessment & Plan Note (Addendum)
Her potassium in the hospital was 2.9, so she is agreeable to getting a BMET today.  ADDENDUM 12/09/2014  6:05 PM:  Potassium 2.8, essentially unchanged from 2 weeks ago. Called patient with the results and advised her to stop taking lisinopril/ HCTZ. Prescribed her K-Dur 40 mEq with follow-up in 2-3 days to recheck lab work to which she read back instructions and acknowledged understanding.

## 2014-12-09 NOTE — Assessment & Plan Note (Signed)
BP Readings from Last 3 Encounters:  12/09/14 128/72  11/26/14 136/84  09/04/14 159/75    Lab Results  Component Value Date   NA 137 11/26/2014   K 2.7* 11/26/2014   CREATININE 0.85 11/26/2014    Assessment: Blood pressure control: controlled Progress toward BP goal:  at goal Comments: Well-controlled on current therapy.  Plan: Medications:  Lisinopril/HCTZ 20/25 mg, amlodipine 5 mg Educational resources provided:   Self management tools provided:   Other plans: Continue current therapy

## 2014-12-09 NOTE — Assessment & Plan Note (Signed)
She was agreeable to receiving her flu shot today.

## 2014-12-09 NOTE — Progress Notes (Signed)
   Subjective:    Patient ID: [name], female    DOB: October 21, 1935, 79 y.o.   MRN: [id number]  HPI Ms. Pelaez is a 79 year old female with obesity, right hip dislocation status post replacement, prediabetes, hypertension who presents today for hospital follow-up. Please see assessment & plan for documentation of chronic medical problems.  Review of Systems  Respiratory: Negative for shortness of breath.   Cardiovascular: Negative for chest pain and palpitations.  Gastrointestinal: Negative for nausea, vomiting, abdominal pain, diarrhea and blood in stool.  Genitourinary: Negative for dysuria.  Neurological: Negative for dizziness and weakness.       Objective:   Physical Exam Constitutional: Obese African-American female. No distress.  Head: Normocephalic and atraumatic.  Eyes: Conjunctivae are normal. Pupils are 4 mm, direct, consensual, near.  Cardiovascular: Normal rate, regular rhythm and normal heart sounds.  No gallop, friction rub, murmur heard. Pulmonary/Chest: Effort normal. No respiratory distress. No wheezes, rales.  Abdominal: Soft. Bowel sounds are normal. No distension. No tenderness.  Neurological: Alert and oriented to person, place, and time.  Coordination normal.  Skin: Warm and dry. Not diaphoretic.  Psychiatric: Affect is appropriate.         Assessment & Plan:

## 2014-12-09 NOTE — Progress Notes (Signed)
   Subjective:    Patient ID: [name], female    DOB: 09-10-1935, 78 y.o.   MRN: [id number]  HPI    Review of Systems     Objective:   Physical Exam        Assessment & Plan:

## 2014-12-09 NOTE — Assessment & Plan Note (Signed)
She underwent closed reduction of the right hip on September 21 by Dr. Sharol Given is due for follow-up in 2 days though needs a referral from our office. Since her hospitalization, she feels better overall and reports some pain associated with standing up though has been avoiding driving, walking. Her pain is well controlled with Tylenol 3. Marland Kitchen

## 2014-12-09 NOTE — Patient Instructions (Addendum)
We will call you about any abnormal labwork. Keep up the good work!

## 2014-12-10 LAB — MICROALBUMIN / CREATININE URINE RATIO
CREATININE, UR: 160.7 mg/dL
MICROALB/CREAT RATIO: 3.4 mg/g creat (ref 0.0–30.0)
Microalbumin, Urine: 5.4 ug/mL

## 2014-12-10 NOTE — Progress Notes (Signed)
This encounter was created in error - please disregard.

## 2014-12-11 DIAGNOSIS — T84021D Dislocation of internal left hip prosthesis, subsequent encounter: Secondary | ICD-10-CM | POA: Diagnosis not present

## 2014-12-13 NOTE — Assessment & Plan Note (Signed)
Urine microalbumin/crt ratio reassuring.

## 2014-12-15 NOTE — Progress Notes (Addendum)
[facility] Medicine Clinic Attending  I saw and evaluated the patient.  I personally confirmed the key portions of the history and exam documented by Dr. Patel,Rushil and I reviewed pertinent patient test results.  The assessment, diagnosis, and plan were formulated together and I agree with the documentation in the resident's note. 

## 2014-12-16 ENCOUNTER — Ambulatory Visit (INDEPENDENT_AMBULATORY_CARE_PROVIDER_SITE_OTHER): Payer: Commercial Managed Care - HMO | Admitting: Internal Medicine

## 2014-12-16 ENCOUNTER — Encounter: Payer: Self-pay | Admitting: Internal Medicine

## 2014-12-16 VITALS — BP 169/72 | HR 84 | Temp 97.9°F | Ht 62.0 in | Wt 242.7 lb

## 2014-12-16 DIAGNOSIS — I10 Essential (primary) hypertension: Secondary | ICD-10-CM

## 2014-12-16 DIAGNOSIS — S73004D Unspecified dislocation of right hip, subsequent encounter: Secondary | ICD-10-CM

## 2014-12-16 DIAGNOSIS — X58XXXD Exposure to other specified factors, subsequent encounter: Secondary | ICD-10-CM | POA: Diagnosis not present

## 2014-12-16 DIAGNOSIS — E876 Hypokalemia: Secondary | ICD-10-CM | POA: Diagnosis not present

## 2014-12-16 LAB — BASIC METABOLIC PANEL
ANION GAP: 9 (ref 5–15)
BUN: 9 mg/dL (ref 6–20)
CHLORIDE: 101 mmol/L (ref 101–111)
CO2: 27 mmol/L (ref 22–32)
Calcium: 9.1 mg/dL (ref 8.9–10.3)
Creatinine, Ser: 0.8 mg/dL (ref 0.44–1.00)
GFR calc non Af Amer: >60 mL/min (ref >60)
Glucose, Bld: 109 mg/dL — ABNORMAL HIGH (ref 65–99)
POTASSIUM: 4 mmol/L (ref 3.5–5.1)
SODIUM: 137 mmol/L (ref 135–145)

## 2014-12-16 LAB — MAGNESIUM: MAGNESIUM: 1.6 mg/dL — AB (ref 1.7–2.4)

## 2014-12-16 MED ORDER — MAGNESIUM OXIDE -MG SUPPLEMENT 500 MG PO CAPS: 500.0000 mg | ORAL_CAPSULE | Freq: Two times a day (BID) | ORAL | Status: DC

## 2014-12-16 MED ORDER — LISINOPRIL 20 MG PO TABS: 20.0000 mg | ORAL_TABLET | Freq: Every day | ORAL | Status: DC

## 2014-12-16 NOTE — Assessment & Plan Note (Addendum)
Brought with her Kdur supplement which she reports taking. Provided list of foods that are rich in K as well since she was asking. Denies chest pain, cramping, changes in energy, palpitations. Repeat BMET and Mg today.  ADDENDUM 12/16/2014  5:45 PM:  Mg 1.6 but K improved. Called her and granddaughter to review results and to replace K supplement with MagOx 500mg  BID and to replace lisinopril/HCTZ with just lisinopril. Granddaugther repeated back instructions to me and with PCP f/u in 2 weeks.

## 2014-12-16 NOTE — Assessment & Plan Note (Signed)
Followed up with Dr. Sharol Given last week who reported she is doing well.

## 2014-12-16 NOTE — Patient Instructions (Addendum)
We will recheck your potassium today and call you with instructions for your blood pressure medication.   Please follow-up with Dr. Dareen Piano in 2 weeks.   Hypokalemia Hypokalemia means that the amount of potassium in the blood is lower than normal.Potassium is a chemical, called an electrolyte, that helps regulate the amount of fluid in the body. It also stimulates muscle contraction and helps nerves function properly.Most of the body's potassium is inside of cells, and only a very small amount is in the blood. Because the amount in the blood is so small, minor changes can be life-threatening. CAUSES  Antibiotics.  Diarrhea or vomiting.  Using laxatives too much, which can cause diarrhea.  Chronic kidney disease.  Water pills (diuretics).  Eating disorders (bulimia).  Low magnesium level.  Sweating a lot. SIGNS AND SYMPTOMS  Weakness.  Constipation.  Fatigue.  Muscle cramps.  Mental confusion.  Skipped heartbeats or irregular heartbeat (palpitations).  Tingling or numbness. DIAGNOSIS  Your health care provider can diagnose hypokalemia with blood tests. In addition to checking your potassium level, your health care provider may also check other lab tests. TREATMENT Hypokalemia can be treated with potassium supplements taken by mouth or adjustments in your current medicines. If your potassium level is very low, you may need to get potassium through a vein (IV) and be monitored in the hospital. A diet high in potassium is also helpful. Foods high in potassium are:  Nuts, such as peanuts and pistachios.  Seeds, such as sunflower seeds and pumpkin seeds.  Peas, lentils, and lima beans.  Whole grain and bran cereals and breads.  Fresh fruit and vegetables, such as apricots, avocado, bananas, cantaloupe, kiwi, oranges, tomatoes, asparagus, and potatoes.  Orange and tomato juices.  Red meats.  Fruit yogurt. HOME CARE INSTRUCTIONS  Take all medicines as prescribed  by your health care provider.  Maintain a healthy diet by including nutritious food, such as fruits, vegetables, nuts, whole grains, and lean meats.  If you are taking a laxative, be sure to follow the directions on the label. SEEK MEDICAL CARE IF:  Your weakness gets worse.  You feel your heart pounding or racing.  You are vomiting or having diarrhea.  You are diabetic and having trouble keeping your blood glucose in the normal range. SEEK IMMEDIATE MEDICAL CARE IF:  You have chest pain, shortness of breath, or dizziness.  You are vomiting or having diarrhea for more than 2 days.  You faint. MAKE SURE YOU:   Understand these instructions.  Will watch your condition.  Will get help right away if you are not doing well or get worse.   This information is not intended to replace advice given to you by your health care provider. Make sure you discuss any questions you have with your health care provider.   Document Released: 02/21/2005 Document Revised: 03/14/2014 Document Reviewed: 08/24/2012 Elsevier Interactive Patient Education [employer].

## 2014-12-16 NOTE — Assessment & Plan Note (Addendum)
BP 169/72 today since she is off her combo lisinopril/HCTZ pill. Changes to be made pending labwork today.  ADDENDUM 12/16/2014  5:43 PM:  Mg 1.6 but K improved. Called her and granddaughter to review results and to replace K supplement with MagOx 500mg  BID and to replace lisinopril/HCTZ with just lisinopril. Granddaugther repeated back instructions to me and with PCP f/u in 2 weeks.

## 2014-12-16 NOTE — Progress Notes (Signed)
   Subjective:    Patient ID: [name], female    DOB: April 17, 1935, 79 y.o.   MRN: [id number]  [name] is a 79 year old female with obesity, right hip dislocation status post replacement, prediabetes, hypertension who presents today for hospital follow-up. Please see assessment & plan for documentation of chronic medical problems.   Review of Systems  Constitutional: Negative for activity change and fatigue.  Respiratory: Negative for shortness of breath.   Cardiovascular: Negative for chest pain, palpitations and leg swelling.  Gastrointestinal: Negative for nausea, vomiting, abdominal pain, diarrhea and blood in stool.  Genitourinary: Negative for dysuria.  Neurological: Negative for dizziness.       Objective:   Physical Exam Constitutional: Obese, elderly African American female. No distress.  Head: Normocephalic and atraumatic.  Eyes: Conjunctivae are normal. Pupils are 3 mm, direct, consensual, near.  Cardiovascular: Normal rate, regular rhythm and normal heart sounds.  No gallop, friction rub, murmur heard. Pulmonary/Chest: Effort normal. No respiratory distress. No wheezes, rales.  Abdominal: Soft. Bowel sounds are normal. No distension. No tenderness.  Neurological: Alert and oriented to person, place, and time.  Coordination normal.  Skin: Warm and dry. Not diaphoretic.  Psychiatric: Affect mobile and congruent with thought content         Assessment & Plan:

## 2014-12-17 NOTE — Progress Notes (Signed)
Internal Medicine Clinic Attending  Case discussed with Dr. Patel,Rushil at the time of the visit.  We reviewed the resident's history and exam and pertinent patient test results.  I agree with the assessment, diagnosis, and plan of care documented in the resident's note.  

## 2014-12-30 ENCOUNTER — Ambulatory Visit (INDEPENDENT_AMBULATORY_CARE_PROVIDER_SITE_OTHER): Payer: Commercial Managed Care - HMO | Admitting: Internal Medicine

## 2014-12-30 ENCOUNTER — Encounter: Payer: Self-pay | Admitting: Internal Medicine

## 2014-12-30 VITALS — BP 148/53 | HR 78 | Temp 98.0°F | Ht 62.0 in | Wt 240.5 lb

## 2014-12-30 DIAGNOSIS — W19XXXD Unspecified fall, subsequent encounter: Secondary | ICD-10-CM | POA: Diagnosis not present

## 2014-12-30 DIAGNOSIS — E876 Hypokalemia: Secondary | ICD-10-CM

## 2014-12-30 DIAGNOSIS — S73004D Unspecified dislocation of right hip, subsequent encounter: Secondary | ICD-10-CM

## 2014-12-30 DIAGNOSIS — I10 Essential (primary) hypertension: Secondary | ICD-10-CM | POA: Diagnosis not present

## 2014-12-30 LAB — BASIC METABOLIC PANEL
ANION GAP: 6 (ref 5–15)
BUN: 8 mg/dL (ref 6–20)
CHLORIDE: 102 mmol/L (ref 101–111)
CO2: 29 mmol/L (ref 22–32)
Calcium: 9.2 mg/dL (ref 8.9–10.3)
Creatinine, Ser: 0.75 mg/dL (ref 0.44–1.00)
GFR calc Af Amer: >60 mL/min (ref >60)
Glucose, Bld: 104 mg/dL — ABNORMAL HIGH (ref 65–99)
POTASSIUM: 3.7 mmol/L (ref 3.5–5.1)
SODIUM: 137 mmol/L (ref 135–145)

## 2014-12-30 LAB — MAGNESIUM: MAGNESIUM: 2.2 mg/dL (ref 1.7–2.4)

## 2014-12-30 MED ORDER — ACETAMINOPHEN-CODEINE #3 300-30 MG PO TABS: 1.0000 | ORAL_TABLET | ORAL | Status: DC | PRN

## 2014-12-30 NOTE — Assessment & Plan Note (Signed)
-   She followed up with ortho recently and is healing well - Does complain of crampy pain like a spasm on standing but improves as she walks - States pain is slowly improving and much better than a month ago - Would continue with voltaren gel and tylenol #3 prn (refilled tylenol #3 today) - Would avoid muscle relaxants for now

## 2014-12-30 NOTE — Assessment & Plan Note (Addendum)
BP Readings from Last 3 Encounters:  12/30/14 148/53  12/16/14 169/72  12/09/14 128/72    Lab Results  Component Value Date   NA 137 12/30/2014   K 3.7 12/30/2014   CREATININE 0.75 12/30/2014    Assessment: Blood pressure control:  fair Progress toward BP goal:   improved Comments: compliant with meds  Plan: Medications:  continue current medications Educational resources provided:   Self management tools provided:   Other plans:  Will recheck on next visit.Check BMP today

## 2014-12-30 NOTE — Progress Notes (Signed)
   Subjective:    Patient ID: [name], female    DOB: 19-Nov-1935, 79 y.o.   MRN: [id number]  HPI 79 y/o female here for follow up of her right hip pain and hypokalemia/hypomagnesemia.  Patient states that her hip pain is slowly improving since her fall and subsequent dislocation. She does have mild R sided hip pain on standing which is crampy but improves slowly as she walks. She recently followed up with ortho who say it is healing well. No fevers, no further falls.  She was also noted to have electrolyte abnormalities on her BMP - initially with hypokalemia and then after stopping HCTZ she was noted to have hypomagnesemia. She was started on magnesium supplements 2 weeks ago. She is now on lisinopril and amlodipine for HTN.    Review of Systems  Constitutional: Negative.   HENT: Negative.   Respiratory: Negative.   Cardiovascular: Negative.   Gastrointestinal: Negative.   Musculoskeletal: Positive for arthralgias and gait problem. Negative for myalgias, joint swelling and neck pain.  Skin: Negative.   Neurological: Negative.   Psychiatric/Behavioral: Negative.        Objective:   Physical Exam  Constitutional: She is oriented to person, place, and time. She appears well-developed and well-nourished.  HENT:  Head: Normocephalic and atraumatic.  Neck: Normal range of motion.  Cardiovascular: Normal rate, regular rhythm and normal heart sounds.   Pulmonary/Chest: Effort normal and breath sounds normal. No respiratory distress. She has no wheezes.  Abdominal: Soft. Bowel sounds are normal. She exhibits no distension. There is no tenderness.  Musculoskeletal: Normal range of motion. She exhibits no edema.  Neurological: She is alert and oriented to person, place, and time.  Skin: Skin is warm and dry. No erythema.  Psychiatric: She has a normal mood and affect. Her behavior is normal.          Assessment & Plan:  Please see problem based charting for assessment and  plan:

## 2014-12-30 NOTE — Assessment & Plan Note (Signed)
-   Potassium level was normal 2 weeks ago. No CP, no muscle cramps, no palpitations - She is now off potassium supplements and is taking magnesium supplements per instructions on her last visit - Will recheck BMP and magnesium - She is now off HCTZ and is taking lisinopril alone

## 2014-12-30 NOTE — Patient Instructions (Signed)
-   It was a pleasure seeing you today - We will check your blood work today to see what your potassium and magnesium levels are] - Your blood pressure has improved since your last visit. We will continue your current medications - Continue with voltaren gel and tylenol 3 as needed for pain

## 2015-01-09 ENCOUNTER — Encounter (INDEPENDENT_AMBULATORY_CARE_PROVIDER_SITE_OTHER): Payer: Commercial Managed Care - HMO | Admitting: Ophthalmology

## 2015-01-13 ENCOUNTER — Other Ambulatory Visit: Payer: Self-pay | Admitting: Internal Medicine

## 2015-01-13 DIAGNOSIS — I10 Essential (primary) hypertension: Secondary | ICD-10-CM

## 2015-01-13 DIAGNOSIS — E876 Hypokalemia: Secondary | ICD-10-CM

## 2015-01-13 MED ORDER — LISINOPRIL 20 MG PO TABS: 20.0000 mg | ORAL_TABLET | Freq: Every day | ORAL | Status: DC

## 2015-01-13 NOTE — Telephone Encounter (Signed)
Pt requesting lisinopril to be filled @ [facility].

## 2015-01-15 NOTE — Telephone Encounter (Signed)
Called pt, she states she has picked the medicine up

## 2015-01-16 ENCOUNTER — Encounter (INDEPENDENT_AMBULATORY_CARE_PROVIDER_SITE_OTHER): Payer: Commercial Managed Care - HMO | Admitting: Ophthalmology

## 2015-01-16 DIAGNOSIS — H35033 Hypertensive retinopathy, bilateral: Secondary | ICD-10-CM | POA: Diagnosis not present

## 2015-01-16 DIAGNOSIS — H2513 Age-related nuclear cataract, bilateral: Secondary | ICD-10-CM

## 2015-01-16 DIAGNOSIS — H34832 Tributary (branch) retinal vein occlusion, left eye, with macular edema: Secondary | ICD-10-CM | POA: Diagnosis not present

## 2015-01-16 DIAGNOSIS — H43813 Vitreous degeneration, bilateral: Secondary | ICD-10-CM

## 2015-01-16 DIAGNOSIS — I10 Essential (primary) hypertension: Secondary | ICD-10-CM | POA: Diagnosis not present

## 2015-02-04 ENCOUNTER — Other Ambulatory Visit: Payer: Self-pay | Admitting: Internal Medicine

## 2015-02-19 ENCOUNTER — Other Ambulatory Visit: Payer: Self-pay | Admitting: Internal Medicine

## 2015-02-19 DIAGNOSIS — E785 Hyperlipidemia, unspecified: Secondary | ICD-10-CM

## 2015-02-19 DIAGNOSIS — I10 Essential (primary) hypertension: Secondary | ICD-10-CM

## 2015-02-19 NOTE — Telephone Encounter (Signed)
At last visit patient was on lisinopril not lisinopril-HCTZ.  This was not refilled and I will ask the PCP to reassess what regimen he wishes the patient to be on.  The last time the GERD was addressed was in 2014 and it looks like it was for "cough".  I will ask the PCP to assess if he would like to continue this therapy at this point.  I refilled 1 month supply with no refills to allow time for this assessment to occur.

## 2015-02-27 ENCOUNTER — Encounter (INDEPENDENT_AMBULATORY_CARE_PROVIDER_SITE_OTHER): Payer: Commercial Managed Care - HMO | Admitting: Ophthalmology

## 2015-04-09 ENCOUNTER — Telehealth: Payer: Self-pay | Admitting: Internal Medicine

## 2015-04-09 NOTE — Telephone Encounter (Signed)
Not needed scheduled patient an appt

## 2015-04-14 ENCOUNTER — Encounter: Payer: Self-pay | Admitting: Internal Medicine

## 2015-04-14 ENCOUNTER — Ambulatory Visit (INDEPENDENT_AMBULATORY_CARE_PROVIDER_SITE_OTHER): Payer: Commercial Managed Care - HMO | Admitting: Internal Medicine

## 2015-04-14 VITALS — BP 131/65 | HR 72 | Temp 98.3°F | Ht 62.0 in | Wt 240.0 lb

## 2015-04-14 DIAGNOSIS — M19011 Primary osteoarthritis, right shoulder: Secondary | ICD-10-CM

## 2015-04-14 DIAGNOSIS — E876 Hypokalemia: Secondary | ICD-10-CM | POA: Diagnosis not present

## 2015-04-14 DIAGNOSIS — M170 Bilateral primary osteoarthritis of knee: Secondary | ICD-10-CM

## 2015-04-14 DIAGNOSIS — I10 Essential (primary) hypertension: Secondary | ICD-10-CM | POA: Diagnosis not present

## 2015-04-14 MED ORDER — LISINOPRIL 40 MG PO TABS: 40.0000 mg | ORAL_TABLET | Freq: Every day | ORAL | Status: DC

## 2015-04-14 MED ORDER — ACETAMINOPHEN-CODEINE #3 300-30 MG PO TABS: 1.0000 | ORAL_TABLET | ORAL | Status: DC | PRN

## 2015-04-14 MED ORDER — DICLOFENAC SODIUM 1 % TD GEL: 2.0000 g | TRANSDERMAL | Status: DC | PRN

## 2015-04-14 NOTE — Progress Notes (Signed)
Subjective:   Patient ID: [name] female   DOB: 1935-03-26 80 y.o.   MRN: [id number]  HPI: Ms. [name] is a 80 y.o. female w/ PMHx of HTN, HLD, hypothyroidism, h/o breast CA, chronic back pain, GERD, ?asthma, and OA, presents to the clinic today for a follow-up visit regarding her shoulder pain. Patient states she is doing very well except for her typical aches and pains from osteoarthritis, most specifically involving her knees and right shoulder. She uses Voltaren gel prn and Tylenol #3, usually about once a day for the pain and states that both of these treatments help. She states her joint pain has been somewhat worse recently but suspects this is simply related to the weather, as this is common for her every winter. No recent joint swelling, redness, fever, chills, or rashes. Patient states she has been exercising regularly and eating healthy.   Past Medical History  Diagnosis Date  . Hypothyroidism   . Lumbago   . Hyperlipidemia   . Hx of breast cancer   . Allergic rhinitis, cause unspecified   . Hypertension   . right shoulder pain, likely Impingement syndrome  09/09/2010  . OBESITY NOS 03/20/2006    Qualifier: Diagnosis of  By: Tomasa Hosteller MD, Veronique D.   . history of Bilateral leg edema-likely amlodipine induced. 07/28/2011  . history of CAP (community acquired pneumonia) 07/14/2011  . GERD (gastroesophageal reflux disease) 05/19/2011  . OSTEOARTHRITIS 12/13/2005    Annotation: bilateral knees;right knee injection 6/05 Qualifier: Diagnosis of  By: Tomasa Hosteller MD, Veronique D.   . VENOUS INSUFFICIENCY, CHRONIC 03/20/2006    Qualifier: Diagnosis of  By: Tomasa Hosteller MD, Veronique D.   . history of HYPOTHYROIDISM, BORDERLINE 03/20/2006    Annotation: Asymptomatic, untreated Qualifier: Diagnosis of  By: Tomasa Hosteller MD, Edmon Crape.   . Chronic back pain   . Cancer ([place])   . Asthma    Current Outpatient Prescriptions  Medication Sig Dispense Refill  . acetaminophen-codeine (TYLENOL #3)  300-30 MG tablet Take 1 tablet by mouth every 4 (four) hours as needed for moderate pain. 30 tablet 0  . albuterol (PROVENTIL HFA;VENTOLIN HFA) 108 (90 BASE) MCG/ACT inhaler Inhale 2 puffs into the lungs every 6 (six) hours as needed for wheezing. 1 Inhaler 6  . amLODipine (NORVASC) 5 MG tablet Take 1 tablet (5 mg total) by mouth daily. 90 tablet 3  . atorvastatin (LIPITOR) 40 MG tablet Take 1 tablet (40 mg total) by mouth daily. 90 tablet 3  . calcium citrate-vitamin D (CITRACAL+D) 315-200 MG-UNIT per tablet Take 1 tablet by mouth 2 (two) times daily. 120 tablet 3  . carvedilol (COREG) 6.25 MG tablet Take 1 tablet (6.25 mg total) by mouth 2 (two) times daily with a meal. 180 tablet 3  . diclofenac sodium (VOLTAREN) 1 % GEL Apply 2 g topically as needed. 1 Tube 1  . lisinopril (PRINIVIL,ZESTRIL) 20 MG tablet Take 1 tablet (20 mg total) by mouth daily. 90 tablet 3  . Magnesium Oxide 500 MG CAPS Take 1 capsule (500 mg total) by mouth 2 (two) times daily. 60 capsule 0  . pantoprazole (PROTONIX) 40 MG tablet Take 1 tablet (40 mg total) by mouth daily. 30 tablet 0   No current facility-administered medications for this visit.    Review of Systems: General: Denies fever, chills, diaphoresis, appetite change and fatigue.  Respiratory: Denies SOB, DOE, cough, and wheezing.   Cardiovascular: Denies chest pain and palpitations.  Gastrointestinal: Denies nausea, vomiting, abdominal pain, and  diarrhea.  Genitourinary: Denies dysuria, increased frequency, and flank pain. Endocrine: Denies hot or cold intolerance, polyuria, and polydipsia. Musculoskeletal: Positive for right shoulder pain and bilateral knee pain. Denies myalgias, back pain, joint swelling, and gait problem.  Skin: Denies pallor, rash and wounds.  Neurological: Denies dizziness, seizures, syncope, weakness, lightheadedness, numbness and headaches.  Psychiatric/Behavioral: Denies mood changes, and sleep disturbances.  Objective:    Physical Exam: Filed Vitals:   04/14/15 0954  BP: 131/65  Pulse: 72  Temp: 98.3 F (36.8 C)  TempSrc: Oral  Height: 5\' 2"  (1.575 m)  Weight: 240 lb (108.863 kg)  SpO2: 96%    General: Overweight, elderly AA female, alert, cooperative, NAD. Walks with a cane.  HEENT: PERRL, EOMI. Moist mucus membranes Neck: Full range of motion without pain, supple, no lymphadenopathy or carotid bruits Lungs: Clear to ascultation bilaterally, normal work of respiration, no wheezes, rales, rhonchi Heart: RRR, no murmurs, gallops, or rubs Abdomen: Soft, non-tender, non-distended, BS + Extremities: No cyanosis, clubbing, or edema. ROM intact in all joints on exam. Mild tenderness over the right acromioclavicular joint, as well as the medial meniscus of both knees.  Neurologic: Alert & oriented X3, cranial nerves II-XII intact, strength grossly intact, sensation intact to light touch   Assessment & Plan:   Please see problem based assessment and plan.

## 2015-04-14 NOTE — Assessment & Plan Note (Signed)
Most likely still has issues with this as she has continued her HCTZ.  -Repeat BMP today -STOP HTCZ. See HTN management for further description

## 2015-04-14 NOTE — Patient Instructions (Addendum)
1. Please make a follow up appointment for 4 weeks.   2. Please take all medications as previously prescribed with the following changes:  STOP taking combo pill Lisinopril-HCTZ. I have increased your Lisinopril to 40 mg daily. Once this dose comes in the mail, stop taking both the Lisinopril 20 mg tablets and the Lisinopril HCTZ combo pill and take only Lisinopril 40 mg daily + amlodipine 5 mg daily + Coreg 6.25 mg twice daily.   I have given you a refill of your Tylenol #3 and Voltaren gel.   3. If you have worsening of your symptoms or new symptoms arise, please call the clinic [phone]), or go to the ER immediately if symptoms are severe.  You have done a great job in taking all your medications. Please continue to do this.

## 2015-04-14 NOTE — Assessment & Plan Note (Signed)
BP Readings from Last 3 Encounters:  04/14/15 131/65  12/30/14 148/53  12/16/14 169/72    Lab Results  Component Value Date   NA 137 12/30/2014   K 3.7 12/30/2014   CREATININE 0.75 12/30/2014    Assessment: Blood pressure control:  Well controlled Progress toward BP goal:    Comments: Patient has had some confusion with her BP medications. Patient was taken off of her ACEI-HCTZ combo pill some time ago, however, it appears that she has been taking Lisinopril HCTZ 20-25 daily + another dose of Lisinopril 20 mg daily + Coreg 6.25 mg bid + Amlodipine 5 mg daily. BP is well controlled however. HCTZ was supposed to be stopped based on hypokalemia and hypomagnesemia.   Plan: Medications:  STOP HCTZ. Continue Lisinopril 40 mg daily as she is tolerating this dose. Continue Amlodipine and Coreg as described above.  Other plans: Discussed plan at length. Will check BMP today given increased dose of ACEI and previous h/o hypokalemia on HCTZ. RTC in 4 weeks for BP recheck.

## 2015-04-14 NOTE — Assessment & Plan Note (Signed)
Most significant in right shoulder and knees bilaterally. Uses voltaren gel and tylenol #3. Feels the winter makes her joint pain worse. No limitations to ROM, only mild tenderness over medial knee joint space and acromioclavicular joint on the right shoulder.  -Refill Tylenol #3, #30 pills.  -Refill Voltaren gel -Do not see any reason for repeat imaging today given her exam.  -Will follow up with PCP in 4 weeks.

## 2015-04-15 LAB — BMP8+ANION GAP
Anion Gap: 19 mmol/L — ABNORMAL HIGH (ref 10.0–18.0)
BUN / CREAT RATIO: 18 (ref 11–26)
BUN: 17 mg/dL (ref 8–27)
CO2: 22 mmol/L (ref 18–29)
CREATININE: 0.95 mg/dL (ref 0.57–1.00)
Calcium: 9.2 mg/dL (ref 8.7–10.3)
Chloride: 96 mmol/L (ref 96–106)
GFR calc non Af Amer: 58 mL/min/{1.73_m2} — ABNORMAL LOW (ref >59)
GFR, EST AFRICAN AMERICAN: 67 mL/min/{1.73_m2} (ref >59)
Glucose: 118 mg/dL — ABNORMAL HIGH (ref 65–99)
Potassium: 3.7 mmol/L (ref 3.5–5.2)
SODIUM: 137 mmol/L (ref 134–144)

## 2015-04-15 LAB — MAGNESIUM: Magnesium: 2 mg/dL (ref 1.6–2.3)

## 2015-04-16 NOTE — Progress Notes (Signed)
Internal Medicine Clinic Attending  Case discussed with Dr. Jones soon after the resident saw the patient.  We reviewed the resident's history and exam and pertinent patient test results.  I agree with the assessment, diagnosis, and plan of care documented in the resident's note. 

## 2015-05-11 DIAGNOSIS — H2513 Age-related nuclear cataract, bilateral: Secondary | ICD-10-CM | POA: Diagnosis not present

## 2015-05-11 DIAGNOSIS — H348322 Tributary (branch) retinal vein occlusion, left eye, stable: Secondary | ICD-10-CM | POA: Diagnosis not present

## 2015-05-11 DIAGNOSIS — H04123 Dry eye syndrome of bilateral lacrimal glands: Secondary | ICD-10-CM | POA: Diagnosis not present

## 2015-05-11 DIAGNOSIS — H35352 Cystoid macular degeneration, left eye: Secondary | ICD-10-CM | POA: Diagnosis not present

## 2015-05-20 ENCOUNTER — Encounter (INDEPENDENT_AMBULATORY_CARE_PROVIDER_SITE_OTHER): Payer: Commercial Managed Care - HMO | Admitting: Ophthalmology

## 2015-05-26 ENCOUNTER — Encounter: Payer: Commercial Managed Care - HMO | Admitting: Internal Medicine

## 2015-06-04 ENCOUNTER — Telehealth: Payer: Self-pay | Admitting: Internal Medicine

## 2015-06-04 NOTE — Telephone Encounter (Signed)
APPT. REMINDER CALL, NO ANSWER, NO VOICE MAIL °

## 2015-06-08 ENCOUNTER — Ambulatory Visit: Payer: Commercial Managed Care - HMO | Admitting: Internal Medicine

## 2015-06-15 ENCOUNTER — Ambulatory Visit: Payer: Commercial Managed Care - HMO | Admitting: Internal Medicine

## 2015-06-15 ENCOUNTER — Telehealth: Payer: Self-pay | Admitting: Internal Medicine

## 2015-06-15 NOTE — Telephone Encounter (Signed)
APPT. REMINDER CALL, NO ANSWER, NO VOICE MAIL °

## 2015-06-16 ENCOUNTER — Ambulatory Visit: Payer: Commercial Managed Care - HMO | Admitting: Internal Medicine

## 2015-06-17 ENCOUNTER — Other Ambulatory Visit: Payer: Self-pay | Admitting: Internal Medicine

## 2015-06-17 NOTE — Telephone Encounter (Signed)
[name], pt needs appt

## 2015-06-29 ENCOUNTER — Encounter (INDEPENDENT_AMBULATORY_CARE_PROVIDER_SITE_OTHER): Payer: Commercial Managed Care - HMO | Admitting: Ophthalmology

## 2015-07-13 DIAGNOSIS — H11011 Amyloid pterygium of right eye: Secondary | ICD-10-CM | POA: Diagnosis not present

## 2015-07-13 DIAGNOSIS — H2513 Age-related nuclear cataract, bilateral: Secondary | ICD-10-CM | POA: Diagnosis not present

## 2015-07-13 DIAGNOSIS — H11052 Peripheral pterygium, progressive, left eye: Secondary | ICD-10-CM | POA: Diagnosis not present

## 2015-07-20 ENCOUNTER — Other Ambulatory Visit: Payer: Self-pay

## 2015-07-20 MED ORDER — PANTOPRAZOLE SODIUM 40 MG PO TBEC: 40.0000 mg | DELAYED_RELEASE_TABLET | Freq: Every day | ORAL | Status: DC

## 2015-07-20 NOTE — Telephone Encounter (Signed)
Pt requesting pantoprazole to be filled @ [facility] on [address].

## 2015-07-24 ENCOUNTER — Other Ambulatory Visit: Payer: Self-pay | Admitting: Ophthalmology

## 2015-07-24 DIAGNOSIS — H52202 Unspecified astigmatism, left eye: Secondary | ICD-10-CM | POA: Diagnosis not present

## 2015-07-24 DIAGNOSIS — H11052 Peripheral pterygium, progressive, left eye: Secondary | ICD-10-CM | POA: Diagnosis not present

## 2015-07-24 DIAGNOSIS — H11002 Unspecified pterygium of left eye: Secondary | ICD-10-CM | POA: Diagnosis not present

## 2015-08-18 DIAGNOSIS — H25811 Combined forms of age-related cataract, right eye: Secondary | ICD-10-CM | POA: Diagnosis not present

## 2015-08-18 DIAGNOSIS — H25812 Combined forms of age-related cataract, left eye: Secondary | ICD-10-CM | POA: Diagnosis not present

## 2015-08-18 DIAGNOSIS — H2512 Age-related nuclear cataract, left eye: Secondary | ICD-10-CM | POA: Diagnosis not present

## 2015-08-18 DIAGNOSIS — H3581 Retinal edema: Secondary | ICD-10-CM | POA: Diagnosis not present

## 2015-08-18 DIAGNOSIS — H43811 Vitreous degeneration, right eye: Secondary | ICD-10-CM | POA: Diagnosis not present

## 2015-08-20 ENCOUNTER — Other Ambulatory Visit: Payer: Self-pay | Admitting: Internal Medicine

## 2015-08-20 NOTE — Telephone Encounter (Signed)
Seen Dr Ronnald Ramp 2/7; f/u appt 7/11.

## 2015-08-31 ENCOUNTER — Encounter (INDEPENDENT_AMBULATORY_CARE_PROVIDER_SITE_OTHER): Payer: PPO | Admitting: Ophthalmology

## 2015-09-15 ENCOUNTER — Encounter: Payer: Self-pay | Admitting: Internal Medicine

## 2015-09-15 ENCOUNTER — Ambulatory Visit (INDEPENDENT_AMBULATORY_CARE_PROVIDER_SITE_OTHER): Payer: PPO | Admitting: Internal Medicine

## 2015-09-15 VITALS — BP 156/81 | HR 70 | Temp 98.1°F | Wt 251.6 lb

## 2015-09-15 DIAGNOSIS — E039 Hypothyroidism, unspecified: Secondary | ICD-10-CM | POA: Diagnosis not present

## 2015-09-15 DIAGNOSIS — K219 Gastro-esophageal reflux disease without esophagitis: Secondary | ICD-10-CM | POA: Diagnosis not present

## 2015-09-15 DIAGNOSIS — I10 Essential (primary) hypertension: Secondary | ICD-10-CM

## 2015-09-15 DIAGNOSIS — M545 Low back pain, unspecified: Secondary | ICD-10-CM

## 2015-09-15 DIAGNOSIS — K30 Functional dyspepsia: Secondary | ICD-10-CM | POA: Insufficient documentation

## 2015-09-15 DIAGNOSIS — E785 Hyperlipidemia, unspecified: Secondary | ICD-10-CM | POA: Diagnosis not present

## 2015-09-15 DIAGNOSIS — R7303 Prediabetes: Secondary | ICD-10-CM

## 2015-09-15 DIAGNOSIS — Z8639 Personal history of other endocrine, nutritional and metabolic disease: Secondary | ICD-10-CM | POA: Diagnosis not present

## 2015-09-15 LAB — POCT GLYCOSYLATED HEMOGLOBIN (HGB A1C): HEMOGLOBIN A1C: 5.9

## 2015-09-15 LAB — GLUCOSE, CAPILLARY: Glucose-Capillary: 102 mg/dL — ABNORMAL HIGH (ref 65–99)

## 2015-09-15 MED ORDER — PANTOPRAZOLE SODIUM 40 MG PO TBEC: 40.0000 mg | DELAYED_RELEASE_TABLET | Freq: Every day | ORAL | Status: DC

## 2015-09-15 MED ORDER — ACETAMINOPHEN-CODEINE #3 300-30 MG PO TABS: 1.0000 | ORAL_TABLET | ORAL | Status: DC | PRN

## 2015-09-15 NOTE — Assessment & Plan Note (Signed)
-   No reflux symptoms - Well controlled on current medication - Refilled pantoprazole for now

## 2015-09-15 NOTE — Assessment & Plan Note (Signed)
-   Patient with borderline elevated TSH on last visit - States she is putting on weight now despite exercising - Will check repeat TSH and consider adding synthroid if TSH elevated

## 2015-09-15 NOTE — Assessment & Plan Note (Signed)
-   Patient takes tylenol 3 intermittently for back pain - Pain is well controlled on current regimen - Will refill tylenol 3 for now

## 2015-09-15 NOTE — Patient Instructions (Addendum)
- It was a pleasure seeing you today - Please continue to exercise and watch your diet - We have a goal of 5 lbs weight loss in the next 3 months - I will refill tylenol 3 for your back pain - We will check your blood work today - Your BP is mildlly elevated today. If elevated on next visit we will go up on your norvasc  Back Pain, Adult Back pain is very common in adults.The cause of back pain is rarely dangerous and the pain often gets better over time.The cause of your back pain may not be known. Some common causes of back pain include:  Strain of the muscles or ligaments supporting the spine.  Wear and tear (degeneration) of the spinal disks.  Arthritis.  Direct injury to the back. For many people, back pain may return. Since back pain is rarely dangerous, most people can learn to manage this condition on their own. HOME CARE INSTRUCTIONS Watch your back pain for any changes. The following actions may help to lessen any discomfort you are feeling:  Remain active. It is stressful on your back to sit or stand in one place for long periods of time. Do not sit, drive, or stand in one place for more than 30 minutes at a time. Take short walks on even surfaces as soon as you are able.Try to increase the length of time you walk each day.  Exercise regularly as directed by your health care provider. Exercise helps your back heal faster. It also helps avoid future injury by keeping your muscles strong and flexible.  Do not stay in bed.Resting more than 1-2 days can delay your recovery.  Pay attention to your body when you bend and lift. The most comfortable positions are those that put less stress on your recovering back. Always use proper lifting techniques, including:  Bending your knees.  Keeping the load close to your body.  Avoiding twisting.  Find a comfortable position to sleep. Use a firm mattress and lie on your side with your knees slightly bent. If you lie on your back,  put a pillow under your knees.  Avoid feeling anxious or stressed.Stress increases muscle tension and can worsen back pain.It is important to recognize when you are anxious or stressed and learn ways to manage it, such as with exercise.  Take medicines only as directed by your health care provider. Over-the-counter medicines to reduce pain and inflammation are often the most helpful.Your health care provider may prescribe muscle relaxant drugs.These medicines help dull your pain so you can more quickly return to your normal activities and healthy exercise.  Apply ice to the injured area:  Put ice in a plastic bag.  Place a towel between your skin and the bag.  Leave the ice on for 20 minutes, 2-3 times a day for the first 2-3 days. After that, ice and heat may be alternated to reduce pain and spasms.  Maintain a healthy weight. Excess weight puts extra stress on your back and makes it difficult to maintain good posture. SEEK MEDICAL CARE IF:  You have pain that is not relieved with rest or medicine.  You have increasing pain going down into the legs or buttocks.  You have pain that does not improve in one week.  You have night pain.  You lose weight.  You have a fever or chills. SEEK IMMEDIATE MEDICAL CARE IF:   You develop new bowel or bladder control problems.  You have unusual weakness  or numbness in your arms or legs.  You develop nausea or vomiting.  You develop abdominal pain.  You feel faint.   This information is not intended to replace advice given to you by your health care provider. Make sure you discuss any questions you have with your health care provider.   Document Released: 02/21/2005 Document Revised: 03/14/2014 Document Reviewed: 06/25/2013 Elsevier Interactive Patient Education [employer].

## 2015-09-15 NOTE — Assessment & Plan Note (Signed)
-   A1C is wnl - Continue with diet and exercise - Patient now with a weight loss goal of 5 lbs in the next 3 months - Advice regarding diet given

## 2015-09-15 NOTE — Assessment & Plan Note (Signed)
-   Recheck lipid panel - c/w lipitor for now

## 2015-09-15 NOTE — Assessment & Plan Note (Signed)
-   Patient notes mild indigestion relieved with burping - No assoc CP, no sob, no diaphoresis, no abd pain - Advised patient to try OTC simethicone and avoid fast food and sodas

## 2015-09-15 NOTE — Assessment & Plan Note (Signed)
BP Readings from Last 3 Encounters:  09/15/15 156/81  04/14/15 131/65  12/30/14 148/53    Lab Results  Component Value Date   NA 137 04/14/2015   K 3.7 04/14/2015   CREATININE 0.95 04/14/2015    Assessment: Blood pressure control:  fair Progress toward BP goal:   deteriorated Comments: Patient is compliant with meds. States she was upset with a repairman this morning and attributes her high BP to this  Plan: Medications:  continue current medications Educational resources provided:   Self management tools provided:   Other plans: Will consider increasing norvasc to 10 mg if BP remains high on follow up. Off HCTZ secondary to recurrent hypokalemia and hypomagnesemia

## 2015-09-15 NOTE — Progress Notes (Signed)
   Subjective:    Patient ID: [name], female    DOB: [birth year], 80 y.o.   MRN: [id number]  HPI  Patient seen and examined. She is here for routine f/u of her HTN. Of note, she also complains of indigestion while eating. States that after eating she feels pressure in her belly and takes TUMS and feels better after she burps. States she drinks sodas and eats a lot of fast food. No CP, no SOB, no diaphoresis, no palpitations. Walks every day for 15-20 min.  Review of Systems  Constitutional: Negative.   HENT: Negative.   Eyes: Negative.   Respiratory: Negative.   Cardiovascular: Negative.   Gastrointestinal: Negative for nausea, abdominal pain, constipation and blood in stool.       Complains of abd pressure relieved with burping after eating  Musculoskeletal: Positive for back pain.  Neurological: Negative.   Psychiatric/Behavioral: Negative.        Objective:   Physical Exam  Constitutional: She is oriented to person, place, and time. She appears well-developed and well-nourished.  HENT:  Head: Normocephalic and atraumatic.  Neck: Normal range of motion. Neck supple.  Cardiovascular: Normal rate, regular rhythm and normal heart sounds.   Pulmonary/Chest: Effort normal and breath sounds normal. No respiratory distress. She has no wheezes.  Abdominal: Soft. Bowel sounds are normal. She exhibits no distension. There is no tenderness.  Musculoskeletal: Normal range of motion. She exhibits no edema or tenderness.  Lymphadenopathy:    She has no cervical adenopathy.  Neurological: She is alert and oriented to person, place, and time.  Skin: Skin is warm. No rash noted. No erythema.  Psychiatric: She has a normal mood and affect. Her behavior is normal.          Assessment & Plan:  Please see problem based charting for assessment and plan of chronic issues:

## 2015-09-16 ENCOUNTER — Telehealth: Payer: Self-pay | Admitting: Internal Medicine

## 2015-09-16 DIAGNOSIS — E039 Hypothyroidism, unspecified: Secondary | ICD-10-CM

## 2015-09-16 LAB — LIPID PANEL
CHOLESTEROL TOTAL: 123 mg/dL (ref 100–199)
Chol/HDL Ratio: 2.2 ratio units (ref 0.0–4.4)
HDL: 57 mg/dL (ref >39)
LDL Calculated: 57 mg/dL (ref 0–99)
Triglycerides: 47 mg/dL (ref 0–149)
VLDL CHOLESTEROL CAL: 9 mg/dL (ref 5–40)

## 2015-09-16 LAB — TSH: TSH: 8.35 u[IU]/mL — AB (ref 0.450–4.500)

## 2015-09-16 NOTE — Telephone Encounter (Signed)
Results of labwork discussed with patient. Noted to have an elevated TSH. Will get free T4 level Patient scheduled for lab appointment on Friday. If free T4 is low would consider starting synthroid. Patient expresses understanding and is in agreement with plan

## 2015-09-17 ENCOUNTER — Encounter (INDEPENDENT_AMBULATORY_CARE_PROVIDER_SITE_OTHER): Payer: PPO | Admitting: Ophthalmology

## 2015-09-17 DIAGNOSIS — H35033 Hypertensive retinopathy, bilateral: Secondary | ICD-10-CM | POA: Diagnosis not present

## 2015-09-17 DIAGNOSIS — H43813 Vitreous degeneration, bilateral: Secondary | ICD-10-CM | POA: Diagnosis not present

## 2015-09-17 DIAGNOSIS — H34832 Tributary (branch) retinal vein occlusion, left eye, with macular edema: Secondary | ICD-10-CM | POA: Diagnosis not present

## 2015-09-17 DIAGNOSIS — H2513 Age-related nuclear cataract, bilateral: Secondary | ICD-10-CM | POA: Diagnosis not present

## 2015-09-17 DIAGNOSIS — I10 Essential (primary) hypertension: Secondary | ICD-10-CM

## 2015-09-17 NOTE — Addendum Note (Signed)
Addended by: Orson Gear on: 09/17/2015 09:14 AM   Modules accepted: Orders

## 2015-09-18 ENCOUNTER — Other Ambulatory Visit: Payer: PPO

## 2015-09-18 ENCOUNTER — Telehealth: Payer: Self-pay | Admitting: Internal Medicine

## 2015-09-18 LAB — SPECIMEN STATUS REPORT

## 2015-09-18 LAB — T4, FREE: FREE T4: 1.1 ng/dL (ref 0.82–1.77)

## 2015-09-18 NOTE — Telephone Encounter (Signed)
Patient's free T4 level is wnl and she has a mildly elevated TSH. Probably subclinical hypothyroidism. Would hold off on prescribing synthroid at this time. Patient is in agreement. Will monitor

## 2015-09-28 ENCOUNTER — Encounter: Payer: Self-pay | Admitting: Gastroenterology

## 2015-11-02 ENCOUNTER — Encounter (INDEPENDENT_AMBULATORY_CARE_PROVIDER_SITE_OTHER): Payer: PPO | Admitting: Ophthalmology

## 2015-11-06 DIAGNOSIS — H25812 Combined forms of age-related cataract, left eye: Secondary | ICD-10-CM | POA: Diagnosis not present

## 2015-11-06 DIAGNOSIS — H2512 Age-related nuclear cataract, left eye: Secondary | ICD-10-CM | POA: Diagnosis not present

## 2015-11-11 ENCOUNTER — Other Ambulatory Visit: Payer: Self-pay

## 2015-11-11 DIAGNOSIS — I10 Essential (primary) hypertension: Secondary | ICD-10-CM

## 2015-11-11 DIAGNOSIS — E876 Hypokalemia: Secondary | ICD-10-CM

## 2015-11-11 DIAGNOSIS — M19011 Primary osteoarthritis, right shoulder: Secondary | ICD-10-CM

## 2015-11-11 DIAGNOSIS — M545 Low back pain, unspecified: Secondary | ICD-10-CM

## 2015-11-11 MED ORDER — ALBUTEROL SULFATE HFA 108 (90 BASE) MCG/ACT IN AERS: 2.0000 | INHALATION_SPRAY | Freq: Four times a day (QID) | RESPIRATORY_TRACT | 3 refills | Status: DC | PRN

## 2015-11-11 MED ORDER — CALCIUM CITRATE-VITAMIN D 315-200 MG-UNIT PO TABS: 1.0000 | ORAL_TABLET | Freq: Two times a day (BID) | ORAL | 3 refills | Status: DC

## 2015-11-11 MED ORDER — ACETAMINOPHEN-CODEINE #3 300-30 MG PO TABS: 1.0000 | ORAL_TABLET | ORAL | 0 refills | Status: DC | PRN

## 2015-11-11 MED ORDER — DICLOFENAC SODIUM 1 % TD GEL: 2.0000 g | TRANSDERMAL | 3 refills | Status: DC | PRN

## 2015-11-11 NOTE — Telephone Encounter (Signed)
Requesting all meds to be filled.

## 2015-11-12 NOTE — Telephone Encounter (Signed)
Pt states she did not receive med,could not remember name, which was 500 mg. Probably, MagOxide which was not refilled - pt inform. Also Tylenol #3 rx called to [facility].

## 2015-11-12 NOTE — Telephone Encounter (Signed)
Has question about medication °

## 2015-11-19 ENCOUNTER — Other Ambulatory Visit: Payer: Self-pay | Admitting: *Deleted

## 2015-11-19 DIAGNOSIS — E785 Hyperlipidemia, unspecified: Secondary | ICD-10-CM

## 2015-11-19 DIAGNOSIS — I10 Essential (primary) hypertension: Secondary | ICD-10-CM

## 2015-11-19 MED ORDER — AMLODIPINE BESYLATE 5 MG PO TABS: 5.0000 mg | ORAL_TABLET | Freq: Every day | ORAL | 0 refills | Status: DC

## 2015-11-19 MED ORDER — LISINOPRIL 40 MG PO TABS: 40.0000 mg | ORAL_TABLET | Freq: Every day | ORAL | 0 refills | Status: DC

## 2015-11-19 MED ORDER — ATORVASTATIN CALCIUM 40 MG PO TABS: 40.0000 mg | ORAL_TABLET | Freq: Every day | ORAL | 0 refills | Status: DC

## 2015-12-18 ENCOUNTER — Other Ambulatory Visit: Payer: Self-pay | Admitting: Internal Medicine

## 2015-12-18 MED ORDER — PANTOPRAZOLE SODIUM 40 MG PO TBEC: 40.0000 mg | DELAYED_RELEASE_TABLET | Freq: Every day | ORAL | 2 refills | Status: DC

## 2015-12-18 NOTE — Telephone Encounter (Signed)
NEEDS REFILL ON PANTOPRAZOLE 40 MG TABLET [facility].  ALL MEDS REFILL EXCEPT THIS ONE PER PT.

## 2015-12-30 DIAGNOSIS — Z961 Presence of intraocular lens: Secondary | ICD-10-CM | POA: Diagnosis not present

## 2015-12-31 ENCOUNTER — Encounter (INDEPENDENT_AMBULATORY_CARE_PROVIDER_SITE_OTHER): Payer: Commercial Managed Care - HMO | Admitting: Ophthalmology

## 2016-01-02 ENCOUNTER — Emergency Department (HOSPITAL_COMMUNITY)
Admission: EM | Admit: 2016-01-02 | Discharge: 2016-01-03 | Disposition: A | Discharge status: Home / Self Care | Payer: PPO | Attending: Emergency Medicine | Admitting: Emergency Medicine

## 2016-01-02 ENCOUNTER — Encounter (HOSPITAL_COMMUNITY): Payer: Self-pay | Admitting: Emergency Medicine

## 2016-01-02 ENCOUNTER — Emergency Department (HOSPITAL_COMMUNITY): Payer: PPO

## 2016-01-02 DIAGNOSIS — M25512 Pain in left shoulder: Secondary | ICD-10-CM | POA: Diagnosis not present

## 2016-01-02 DIAGNOSIS — Z87891 Personal history of nicotine dependence: Secondary | ICD-10-CM | POA: Diagnosis not present

## 2016-01-02 DIAGNOSIS — Z79899 Other long term (current) drug therapy: Secondary | ICD-10-CM | POA: Insufficient documentation

## 2016-01-02 DIAGNOSIS — I10 Essential (primary) hypertension: Secondary | ICD-10-CM | POA: Diagnosis not present

## 2016-01-02 DIAGNOSIS — J45909 Unspecified asthma, uncomplicated: Secondary | ICD-10-CM | POA: Insufficient documentation

## 2016-01-02 DIAGNOSIS — E039 Hypothyroidism, unspecified: Secondary | ICD-10-CM | POA: Insufficient documentation

## 2016-01-02 DIAGNOSIS — M19012 Primary osteoarthritis, left shoulder: Secondary | ICD-10-CM | POA: Diagnosis not present

## 2016-01-02 MED ORDER — IBUPROFEN 400 MG PO TABS: 400.0000 mg | ORAL_TABLET | Freq: Four times a day (QID) | ORAL | 0 refills | Status: DC | PRN

## 2016-01-02 NOTE — ED Triage Notes (Signed)
Patient states that she has left shoulder pain that has been going on for 5 days. Patient had pain meds that MD gave her but pain continues to comes back after medication wears off.  Patient denies any injury.  Pain radiates worse with sitting straight up.patient states she has been diagnosed with arthritis in the left shoulder "but never lasted that long".

## 2016-01-02 NOTE — Discharge Instructions (Signed)
X-ray shows no fracture. Prescription for ibuprofen. Follow-up your primary care doctor.

## 2016-01-02 NOTE — ED Notes (Signed)
MD at bedside. 

## 2016-01-02 NOTE — ED Notes (Signed)
Patient transported to X-ray 

## 2016-01-02 NOTE — ED Notes (Signed)
Pt is sitting in chair. States she has had pain in lf shoulder for 5 days. Has taken Tylenol #3  For 3 days with no relief. Denies chest pain, diaphoresis, nausea, h/o DM or cardiac.Does not remember lifting anything that could cause pain.  Pt appears to be in no acute distress.

## 2016-01-03 NOTE — ED Provider Notes (Signed)
[facility] DEPT Provider Note   CSN: [id number] Arrival date & time: 01/02/16  1402     History   Chief Complaint Chief Complaint  Patient presents with  . Shoulder Pain    [name] is a 80 y.o. female.  Pain in posterior left shoulder for several days. No trauma or injury. She has taken Tylenol No. 3 and ibuprofen for pain. No chest pain, dyspnea, diaphoresis. Range of motion makes pain worse.      Past Medical History:  Diagnosis Date  . Allergic rhinitis, cause unspecified   . Asthma   . Cancer ([place])   . Chronic back pain   . GERD (gastroesophageal reflux disease) 05/19/2011  . history of Bilateral leg edema-likely amlodipine induced. 07/28/2011  . history of CAP (community acquired pneumonia) 07/14/2011  . history of HYPOTHYROIDISM, BORDERLINE 03/20/2006   Annotation: Asymptomatic, untreated Qualifier: Diagnosis of  By: Tomasa Hosteller MD, Edmon Crape.   . Hx of breast cancer   . Hyperlipidemia   . Hypertension   . Hypothyroidism   . Lumbago   . OBESITY NOS 03/20/2006   Qualifier: Diagnosis of  By: Tomasa Hosteller MD, [place] 12/13/2005   Annotation: bilateral knees;right knee injection 6/05 Qualifier: Diagnosis of  By: Tomasa Hosteller MD, Veronique D.   . right shoulder pain, likely Impingement syndrome  09/09/2010  . VENOUS INSUFFICIENCY, CHRONIC 03/20/2006   Qualifier: Diagnosis of  By: Tomasa Hosteller MD, Edmon Crape.     Patient Active Problem List   Diagnosis Date Noted  . Mild dietary indigestion 09/15/2015  . Preventative health care 09/04/2014  . Borderline diabetes 09/19/2013  . Hypertensive retinopathy 03/29/2013  . Pterygium of left eye 03/29/2013  . GERD (gastroesophageal reflux disease) 05/19/2011  . Health maintenance examination 09/09/2010  . right shoulder pain, likely Impingement syndrome  09/09/2010  . Hyperlipidemia 05/02/2007  . ALLERGIC RHINITIS 09/06/2006  . Hypothyroidism 03/20/2006  . Severe obesity (BMI >= 40) ([place]) 03/20/2006    . VENOUS INSUFFICIENCY, CHRONIC 03/20/2006  . LOW BACK PAIN 03/20/2006  . BREAST CANCER, HX OF 03/20/2006  . Essential hypertension 12/13/2005  . Osteoarthritis 12/13/2005    Past Surgical History:  Procedure Laterality Date  . ABDOMINAL HYSTERECTOMY    . BREAST LUMPECTOMY    . HERNIA REPAIR    . HIP CLOSED REDUCTION Right 11/26/2014   Procedure: CLOSED REDUCTIION RIGHT HIP;  Surgeon: Newt Minion, MD;  Location: [facility];  Service: Orthopedics;  Laterality: Right;  . INCISIONAL HERNIA REPAIR N/A 10/29/2013   Procedure: OPEN REPAIR OF RECURRENT INCISIONAL HERNIA WITH MESH;  Surgeon: Zenovia Jarred, MD;  Location: [facility];  Service: General;  Laterality: N/A;  . TOTAL HIP ARTHROPLASTY      OB History    No data available       Home Medications    Prior to Admission medications   Medication Sig Start Date End Date Taking? Authorizing Provider  acetaminophen-codeine (TYLENOL #3) 300-30 MG tablet Take 1 tablet by mouth every 4 (four) hours as needed for moderate pain. 11/11/15   Nischal Narendra, MD  albuterol (PROVENTIL HFA;VENTOLIN HFA) 108 (90 Base) MCG/ACT inhaler Inhale 2 puffs into the lungs every 6 (six) hours as needed for wheezing. 11/11/15 11/20/16  Nischal Narendra, MD  amLODipine (NORVASC) 5 MG tablet Take 1 tablet (5 mg total) by mouth daily. 11/19/15   Nischal Narendra, MD  atorvastatin (LIPITOR) 40 MG tablet Take 1 tablet (40 mg total) by mouth daily. 11/19/15   Nischal  Dareen Piano, MD  calcium citrate-vitamin D (CITRACAL+D) 315-200 MG-UNIT tablet Take 1 tablet by mouth 2 (two) times daily. 11/11/15   Aldine Contes, MD  carvedilol (COREG) 6.25 MG tablet Take 1 tablet (6.25 mg total) by mouth 2 (two) times daily with a meal. 02/19/15   Oval Linsey, MD  diclofenac sodium (VOLTAREN) 1 % GEL Apply 2 g topically as needed. 11/11/15   Aldine Contes, MD  ibuprofen (ADVIL,MOTRIN) 400 MG tablet Take 1 tablet (400 mg total) by mouth every 6 (six) hours as needed. 01/02/16   Nat Christen,  MD  lisinopril (PRINIVIL,ZESTRIL) 40 MG tablet Take 1 tablet (40 mg total) by mouth daily. 11/19/15   Aldine Contes, MD  Magnesium Oxide 500 MG CAPS Take 1 capsule (500 mg total) by mouth 2 (two) times daily. 12/16/14   Riccardo Dubin, MD  pantoprazole (PROTONIX) 40 MG tablet Take 1 tablet (40 mg total) by mouth daily. 12/18/15   Aldine Contes, MD    Family History Family History  Problem Relation Age of Onset  . Hypertension Mother   . Alzheimer's disease Mother   . Diabetes Sister     Social History Social History  Substance Use Topics  . Smoking status: Former Smoker    Quit date: 09/09/1958  . Smokeless tobacco: Never Used  . Alcohol use No     Allergies   Acyclovir and related; Metoprolol; and Other   Review of Systems Review of Systems  All other systems reviewed and are negative.    Physical Exam Updated Vital Signs BP 167/82 (BP Location: Left Arm)   Pulse 80   Temp 98 F (36.7 C) (Oral)   Resp 18   SpO2 94%   Physical Exam  Constitutional: She is oriented to person, place, and time. She appears well-developed and well-nourished.  HENT:  Head: Normocephalic and atraumatic.  Eyes: Conjunctivae are normal.  Neck: Neck supple.  Cardiovascular: Normal rate and regular rhythm.   Pulmonary/Chest: Effort normal and breath sounds normal.  Abdominal: Soft. Bowel sounds are normal.  Musculoskeletal:  Tender over the left scapula. Full range of motion of the shoulder  Neurological: She is alert and oriented to person, place, and time.  Skin: Skin is warm and dry.  Psychiatric: She has a normal mood and affect. Her behavior is normal.  Nursing note and vitals reviewed.    ED Treatments / Results  Labs (all labs ordered are listed, but only abnormal results are displayed) Labs Reviewed - No data to display  EKG  EKG Interpretation None       Radiology Dg Shoulder Left  Result Date: 01/02/2016 CLINICAL DATA:  Left shoulder pain EXAM: LEFT  SHOULDER - 2+ VIEW COMPARISON:  None. FINDINGS: No fracture or dislocation is seen. Degenerative changes of the acromioclavicular joint. Subacromial spurring. Soft tissues are unremarkable. Visualized left lung is clear. IMPRESSION: No fracture or dislocation is seen. Degenerative changes. Electronically Signed   By: Julian Hy M.D.   On: 01/02/2016 15:52    Procedures Procedures (including critical care time)  Medications Ordered in ED Medications - No data to display   Initial Impression / Assessment and Plan / ED Course  I have reviewed the triage vital signs and the nursing notes.  Pertinent labs & imaging results that were available during my care of the patient were reviewed by me and considered in my medical decision making (see chart for details).  Clinical Course    Patient is in no acute distress. Doubt cardiac etiology.  Plain films of left shoulder negative. Discharge medications ibuprofen 400 mg.  Final Clinical Impressions(s) / ED Diagnoses   Final diagnoses:  Left shoulder pain, unspecified chronicity    New Prescriptions Discharge Medication List as of 01/02/2016  4:54 PM    START taking these medications   Details  ibuprofen (ADVIL,MOTRIN) 400 MG tablet Take 1 tablet (400 mg total) by mouth every 6 (six) hours as needed., Starting Sat 01/02/2016, Print         Nat Christen, MD 01/03/16 1051

## 2016-01-11 ENCOUNTER — Telehealth: Payer: Self-pay | Admitting: Internal Medicine

## 2016-01-11 NOTE — Telephone Encounter (Signed)
AT. REMINDER CALL, NO ANSWER, NO VOICEMAIL °

## 2016-01-12 ENCOUNTER — Encounter: Payer: Self-pay | Admitting: Internal Medicine

## 2016-01-12 ENCOUNTER — Ambulatory Visit (INDEPENDENT_AMBULATORY_CARE_PROVIDER_SITE_OTHER): Payer: PPO | Admitting: Internal Medicine

## 2016-01-12 DIAGNOSIS — M546 Pain in thoracic spine: Secondary | ICD-10-CM | POA: Diagnosis not present

## 2016-01-12 DIAGNOSIS — E039 Hypothyroidism, unspecified: Secondary | ICD-10-CM | POA: Diagnosis not present

## 2016-01-12 DIAGNOSIS — Z7689 Persons encountering health services in other specified circumstances: Secondary | ICD-10-CM | POA: Diagnosis not present

## 2016-01-12 DIAGNOSIS — Z23 Encounter for immunization: Secondary | ICD-10-CM

## 2016-01-12 DIAGNOSIS — Z79899 Other long term (current) drug therapy: Secondary | ICD-10-CM

## 2016-01-12 DIAGNOSIS — Z0000 Encounter for general adult medical examination without abnormal findings: Secondary | ICD-10-CM

## 2016-01-12 DIAGNOSIS — Z6841 Body Mass Index (BMI) 40.0 and over, adult: Secondary | ICD-10-CM

## 2016-01-12 DIAGNOSIS — I10 Essential (primary) hypertension: Secondary | ICD-10-CM | POA: Diagnosis not present

## 2016-01-12 MED ORDER — IBUPROFEN 400 MG PO TABS: 400.0000 mg | ORAL_TABLET | Freq: Three times a day (TID) | ORAL | 0 refills | Status: DC

## 2016-01-12 MED ORDER — CYCLOBENZAPRINE HCL 5 MG PO TABS: 5.0000 mg | ORAL_TABLET | Freq: Three times a day (TID) | ORAL | 0 refills | Status: DC | PRN

## 2016-01-12 MED ORDER — AMLODIPINE BESYLATE 10 MG PO TABS: 10.0000 mg | ORAL_TABLET | Freq: Every day | ORAL | 0 refills | Status: DC

## 2016-01-12 NOTE — Assessment & Plan Note (Signed)
BP Readings from Last 3 Encounters:  01/12/16 (!) 170/99  01/02/16 167/82  09/15/15 (!) 156/81    Lab Results  Component Value Date   NA 137 04/14/2015   K 3.7 04/14/2015   CREATININE 0.95 04/14/2015    Assessment: Blood pressure control:  poorly controlled Progress toward BP goal:   deteriorated Comments: compliant with meds. Off HCTZ secondary to recurrent hypokalemia and hypomagnesemia  Plan: Medications:  will increase amlodipine to 10 mg. Continue with lisinopril 40 mg and carvedilol 6.25 mg Educational resources provided: brochure (denies) Self management tools provided: home blood pressure logbook Other plans: Will check BMP today

## 2016-01-12 NOTE — Progress Notes (Signed)
   Subjective:    Patient ID: [name], female    DOB: 01/27/1936, 80 y.o.   MRN: [id number]  HPI  Patient is here for routine follow up of her HTN and her hypothyroidism. She denies any new complaints and is compliant with her medications.   She does complain of left sided back pain over the last 2 weeks - over her left scapula, worse with movement, some pain intermittently radiating down her left arm. She was seen in the ED for this and had a left shoulder X ray which was normal. She was started on ibuprofen which helped but she is now out of pills. No fevers, no CP,no SOB, no n/v, no diaphoresis.   Review of Systems  Constitutional: Negative.   HENT: Negative.   Respiratory: Negative.   Cardiovascular: Negative.   Gastrointestinal: Negative.   Musculoskeletal: Positive for back pain and neck pain. Negative for myalgias.  Neurological: Negative.   Psychiatric/Behavioral: Negative.        Objective:   Physical Exam  Constitutional: She is oriented to person, place, and time. She appears well-developed and well-nourished.  HENT:  Head: Normocephalic and atraumatic.  Mouth/Throat: No oropharyngeal exudate.  Neck: Normal range of motion. Neck supple.  Cardiovascular: Normal rate, regular rhythm and normal heart sounds.   Pulmonary/Chest: Effort normal and breath sounds normal. No respiratory distress. She has no wheezes.  Abdominal: Soft. Bowel sounds are normal. She exhibits no distension. There is no tenderness.  Musculoskeletal: Normal range of motion. She exhibits no edema.  Mild tenderness over left scapula, no pain with movement of her left shoulder  Lymphadenopathy:    She has no cervical adenopathy.  Neurological: She is alert and oriented to person, place, and time.  Skin: Skin is warm. No erythema.  Psychiatric: She has a normal mood and affect. Her behavior is normal.          Assessment & Plan:  Please see problem based charting for assessment and  plan:

## 2016-01-12 NOTE — Assessment & Plan Note (Signed)
-   Patient states she has been trying to walk and work her arms out - Discussed compliance with diet and exercise - We set a goal of 5 lb weight loss over the next 2-3 months - Will continue to assess

## 2016-01-12 NOTE — Assessment & Plan Note (Signed)
Flu shot given today

## 2016-01-12 NOTE — Assessment & Plan Note (Addendum)
-   Patient denies any new symptoms or signs of hypothyroidism - Was noted to have elevated TSH but normal free T4 on last check - Will repeat TSH and free T4 today

## 2016-01-12 NOTE — Assessment & Plan Note (Addendum)
-   Likely MSK in nature. She has been trying to work her arms out and exercise more and believes this amy have contributed to ehr pain - Complaining of pain over left scapula over the last 2 weeks and occasional pain going down left arm. It is worse with movement of her left UE and twisting her back. Mildly tender to palpation over left scapula - X ray left shoulder done in ED was wnl - Will continue with ibuprofen and started on flexeril for now - Patient warned about side effects of flexeril and advised to stop med and call [facility] if she experiences any of these - If pain persists will need follow up and further work up

## 2016-01-12 NOTE — Patient Instructions (Signed)
-   It was a pleasure seeing you today - We will check some blood work on you today - It appears you have some muscular back pain. Will start you on flexeril and refill your ibuprofen- If the pain does not improve please follow up for further work up - we will increase your amlodipine to 10 mg. You can take two of the 5 mg pills till you run out

## 2016-01-13 ENCOUNTER — Telehealth: Payer: Self-pay | Admitting: Internal Medicine

## 2016-01-13 DIAGNOSIS — E876 Hypokalemia: Secondary | ICD-10-CM

## 2016-01-13 LAB — T4, FREE: Free T4: 1.04 ng/dL (ref 0.82–1.77)

## 2016-01-13 LAB — BMP8+ANION GAP
Anion Gap: 18 mmol/L (ref 10.0–18.0)
BUN / CREAT RATIO: 22 (ref 12–28)
BUN: 14 mg/dL (ref 8–27)
CO2: 28 mmol/L (ref 18–29)
CREATININE: 0.64 mg/dL (ref 0.57–1.00)
Calcium: 9.1 mg/dL (ref 8.7–10.3)
Chloride: 98 mmol/L (ref 96–106)
GFR calc non Af Amer: 86 mL/min/{1.73_m2} (ref >59)
GFR, EST AFRICAN AMERICAN: 100 mL/min/{1.73_m2} (ref >59)
Glucose: 107 mg/dL — ABNORMAL HIGH (ref 65–99)
Potassium: 3.1 mmol/L — ABNORMAL LOW (ref 3.5–5.2)
Sodium: 144 mmol/L (ref 134–144)

## 2016-01-13 LAB — TSH: TSH: 5.16 u[IU]/mL — ABNORMAL HIGH (ref 0.450–4.500)

## 2016-01-13 MED ORDER — POTASSIUM CHLORIDE ER 10 MEQ PO TBCR: 10.0000 meq | EXTENDED_RELEASE_TABLET | Freq: Every day | ORAL | 0 refills | Status: DC

## 2016-01-13 NOTE — Telephone Encounter (Signed)
Called patient re: low K.  She has a history of this, thought to be related to HCTZ use, but low again.  She has received supplementation in the past.  She reported her name and birthday as identifiers.  We agree to have her take 47meq of KCL for 7 days and then come back in for BMET next week to see if K is improved.  Will contact lab personnel to let them know of this plan.

## 2016-01-25 ENCOUNTER — Encounter (INDEPENDENT_AMBULATORY_CARE_PROVIDER_SITE_OTHER): Payer: PPO | Admitting: Ophthalmology

## 2016-01-25 DIAGNOSIS — H34832 Tributary (branch) retinal vein occlusion, left eye, with macular edema: Secondary | ICD-10-CM

## 2016-01-25 DIAGNOSIS — H35033 Hypertensive retinopathy, bilateral: Secondary | ICD-10-CM

## 2016-01-25 DIAGNOSIS — H43813 Vitreous degeneration, bilateral: Secondary | ICD-10-CM | POA: Diagnosis not present

## 2016-01-25 DIAGNOSIS — I10 Essential (primary) hypertension: Secondary | ICD-10-CM | POA: Diagnosis not present

## 2016-01-28 ENCOUNTER — Emergency Department (HOSPITAL_COMMUNITY)
Admission: EM | Admit: 2016-01-28 | Discharge: 2016-01-28 | Disposition: A | Discharge status: Home / Self Care | Payer: PPO | Attending: Emergency Medicine | Admitting: Emergency Medicine

## 2016-01-28 ENCOUNTER — Emergency Department (HOSPITAL_COMMUNITY): Payer: PPO

## 2016-01-28 ENCOUNTER — Encounter (HOSPITAL_COMMUNITY): Payer: Self-pay | Admitting: Emergency Medicine

## 2016-01-28 DIAGNOSIS — Z96649 Presence of unspecified artificial hip joint: Secondary | ICD-10-CM | POA: Diagnosis not present

## 2016-01-28 DIAGNOSIS — M25551 Pain in right hip: Secondary | ICD-10-CM | POA: Diagnosis not present

## 2016-01-28 DIAGNOSIS — E039 Hypothyroidism, unspecified: Secondary | ICD-10-CM | POA: Insufficient documentation

## 2016-01-28 DIAGNOSIS — S73004A Unspecified dislocation of right hip, initial encounter: Secondary | ICD-10-CM | POA: Diagnosis not present

## 2016-01-28 DIAGNOSIS — S73001A Unspecified subluxation of right hip, initial encounter: Secondary | ICD-10-CM | POA: Diagnosis not present

## 2016-01-28 DIAGNOSIS — I10 Essential (primary) hypertension: Secondary | ICD-10-CM | POA: Insufficient documentation

## 2016-01-28 DIAGNOSIS — Z96641 Presence of right artificial hip joint: Secondary | ICD-10-CM | POA: Diagnosis not present

## 2016-01-28 DIAGNOSIS — Y929 Unspecified place or not applicable: Secondary | ICD-10-CM | POA: Diagnosis not present

## 2016-01-28 DIAGNOSIS — J45909 Unspecified asthma, uncomplicated: Secondary | ICD-10-CM | POA: Insufficient documentation

## 2016-01-28 DIAGNOSIS — Z853 Personal history of malignant neoplasm of breast: Secondary | ICD-10-CM | POA: Diagnosis not present

## 2016-01-28 DIAGNOSIS — S30810A Abrasion of lower back and pelvis, initial encounter: Secondary | ICD-10-CM | POA: Diagnosis not present

## 2016-01-28 DIAGNOSIS — Y999 Unspecified external cause status: Secondary | ICD-10-CM | POA: Insufficient documentation

## 2016-01-28 DIAGNOSIS — Z87891 Personal history of nicotine dependence: Secondary | ICD-10-CM | POA: Diagnosis not present

## 2016-01-28 DIAGNOSIS — R52 Pain, unspecified: Secondary | ICD-10-CM | POA: Diagnosis not present

## 2016-01-28 DIAGNOSIS — Z79899 Other long term (current) drug therapy: Secondary | ICD-10-CM | POA: Insufficient documentation

## 2016-01-28 DIAGNOSIS — Z471 Aftercare following joint replacement surgery: Secondary | ICD-10-CM | POA: Diagnosis not present

## 2016-01-28 DIAGNOSIS — T148XXA Other injury of unspecified body region, initial encounter: Secondary | ICD-10-CM

## 2016-01-28 DIAGNOSIS — X58XXXA Exposure to other specified factors, initial encounter: Secondary | ICD-10-CM | POA: Diagnosis not present

## 2016-01-28 DIAGNOSIS — S79911A Unspecified injury of right hip, initial encounter: Secondary | ICD-10-CM | POA: Diagnosis not present

## 2016-01-28 DIAGNOSIS — Y939 Activity, unspecified: Secondary | ICD-10-CM | POA: Diagnosis not present

## 2016-01-28 DIAGNOSIS — R0902 Hypoxemia: Secondary | ICD-10-CM | POA: Diagnosis not present

## 2016-01-28 LAB — CBC WITH DIFFERENTIAL/PLATELET
BASOS ABS: 0 10*3/uL (ref 0.0–0.1)
Basophils Relative: 0 %
EOS ABS: 0.6 10*3/uL (ref 0.0–0.7)
EOS PCT: 6 %
HCT: 34.5 % — ABNORMAL LOW (ref 36.0–46.0)
HEMOGLOBIN: 11.1 g/dL — AB (ref 12.0–15.0)
LYMPHS ABS: 2 10*3/uL (ref 0.7–4.0)
Lymphocytes Relative: 21 %
MCH: 25.3 pg — AB (ref 26.0–34.0)
MCHC: 32.2 g/dL (ref 30.0–36.0)
MCV: 78.6 fL (ref 78.0–100.0)
Monocytes Absolute: 0.5 10*3/uL (ref 0.1–1.0)
Monocytes Relative: 6 %
NEUTROS PCT: 67 %
Neutro Abs: 6.2 10*3/uL (ref 1.7–7.7)
PLATELETS: 258 10*3/uL (ref 150–400)
RBC: 4.39 MIL/uL (ref 3.87–5.11)
RDW: 14.8 % (ref 11.5–15.5)
WBC: 9.3 10*3/uL (ref 4.0–10.5)

## 2016-01-28 LAB — BASIC METABOLIC PANEL
Anion gap: 8 (ref 5–15)
BUN: 12 mg/dL (ref 6–20)
CHLORIDE: 103 mmol/L (ref 101–111)
CO2: 27 mmol/L (ref 22–32)
CREATININE: 0.81 mg/dL (ref 0.44–1.00)
Calcium: 8.7 mg/dL — ABNORMAL LOW (ref 8.9–10.3)
Glucose, Bld: 159 mg/dL — ABNORMAL HIGH (ref 65–99)
POTASSIUM: 3.2 mmol/L — AB (ref 3.5–5.1)
SODIUM: 138 mmol/L (ref 135–145)

## 2016-01-28 MED ORDER — ACETAMINOPHEN 325 MG PO TABS
650.0000 mg | ORAL_TABLET | Freq: Once | ORAL | Status: AC
  Administered 2016-01-28: 650 mg via ORAL
  Filled 2016-01-28: qty 2

## 2016-01-28 MED ORDER — BACITRACIN ZINC 500 UNIT/GM EX OINT
1.0000 "application " | TOPICAL_OINTMENT | Freq: Two times a day (BID) | CUTANEOUS | Status: DC
  Administered 2016-01-28: 1 via TOPICAL
  Filled 2016-01-28: qty 0.9

## 2016-01-28 MED ORDER — KETAMINE HCL-SODIUM CHLORIDE 100-0.9 MG/10ML-% IV SOSY
0.5000 mg/kg | PREFILLED_SYRINGE | Freq: Once | INTRAVENOUS | Status: AC
  Administered 2016-01-28: 50 mg via INTRAVENOUS
  Filled 2016-01-28: qty 10

## 2016-01-28 MED ORDER — PROPOFOL 10 MG/ML IV BOLUS
0.5000 mg/kg | Freq: Once | INTRAVENOUS | Status: AC
  Administered 2016-01-28: 50 mg via INTRAVENOUS
  Filled 2016-01-28: qty 20

## 2016-01-28 MED ORDER — FENTANYL CITRATE (PF) 100 MCG/2ML IJ SOLN
50.0000 ug | Freq: Once | INTRAMUSCULAR | Status: AC
  Administered 2016-01-28: 50 ug via INTRAVENOUS
  Filled 2016-01-28: qty 2

## 2016-01-28 NOTE — Sedation Documentation (Signed)
Pt unsure of her location. Denies pain.

## 2016-01-28 NOTE — Sedation Documentation (Signed)
Pt responds yes/no to question and responds to command to squeeze hand.

## 2016-01-28 NOTE — Sedation Documentation (Signed)
MD trying new position for reduction.

## 2016-01-28 NOTE — Sedation Documentation (Signed)
R leg immobilizer applied.

## 2016-01-28 NOTE — ED Triage Notes (Addendum)
Pt felt R hip pop when she sat on the side of the bed this am. Hx of R hip replacement and having hip dislocation. R hip internal rotation noted; pedal pulses strong. Not on blood thinners, no falls. EMS gave 170mcg en route.

## 2016-01-28 NOTE — Sedation Documentation (Signed)
MD gave pt 2.18ml more of propofol/ketamine mix.

## 2016-01-28 NOTE — Discharge Instructions (Signed)
Your hip prosthesis, has been replaced, following the dislocation  It is important to keep the knee immobilizer on your right knee, to prevent further episodes of dislocation of the hip.  Follow-up with your orthopedist within 1 week for a checkup, and to see when you can remove the knee immobilizer.  For the abrasion in the right groin, following the reduction maneuver, keep the area clean with soap and water daily and apply an antibiotic ointment such as bacitracin, until healed

## 2016-01-28 NOTE — Sedation Documentation (Signed)
Pt repositioned, reduction resumed

## 2016-01-28 NOTE — Sedation Documentation (Signed)
X-ray at bedside

## 2016-01-28 NOTE — Sedation Documentation (Signed)
2 MDs, PA, respiratory, EMT and RN at bedside.

## 2016-01-28 NOTE — ED Notes (Signed)
Bed: [id number] Expected date:  Expected time:  Means of arrival:  Comments: EMS Hip dislocation

## 2016-01-28 NOTE — ED Provider Notes (Signed)
[facility] DEPT Provider Note   CSN: [id number] Arrival date & time: 01/28/16  0910     History   Chief Complaint Chief Complaint  Patient presents with  . Hip Pain    [name] is a 80 y.o. female.  She presents for evaluation of "my hip popped out". She states she got up quickly to go to the bathroom and felt her right hip give way. She feels like it dislocated. She has not yet eaten this morning. She does not have any recent illnesses. She is treated by EMS, for pain, with fentanyl 150 g. On arrival, she was hypoxic. And placed on nasal cannula oxygen. This improved her oxygen saturation to normal. Patient denies recent fever, chills, cough, weakness or dizziness. She is taking her usual medications. She was recently evaluated in the emergency department for shoulder pain. There are no other known modifying factors.  HPI  Past Medical History:  Diagnosis Date  . Allergic rhinitis, cause unspecified   . Asthma   . Cancer ([place])   . Chronic back pain   . GERD (gastroesophageal reflux disease) 05/19/2011  . history of Bilateral leg edema-likely amlodipine induced. 07/28/2011  . history of CAP (community acquired pneumonia) 07/14/2011  . history of HYPOTHYROIDISM, BORDERLINE 03/20/2006   Annotation: Asymptomatic, untreated Qualifier: Diagnosis of  By: Tomasa Hosteller MD, Edmon Crape.   . Hx of breast cancer   . Hyperlipidemia   . Hypertension   . Hypothyroidism   . Lumbago   . OBESITY NOS 03/20/2006   Qualifier: Diagnosis of  By: Tomasa Hosteller MD, [place] 12/13/2005   Annotation: bilateral knees;right knee injection 6/05 Qualifier: Diagnosis of  By: Tomasa Hosteller MD, Veronique D.   . right shoulder pain, likely Impingement syndrome  09/09/2010  . VENOUS INSUFFICIENCY, CHRONIC 03/20/2006   Qualifier: Diagnosis of  By: Tomasa Hosteller MD, Edmon Crape.     Patient Active Problem List   Diagnosis Date Noted  . Left-sided thoracic back pain 01/12/2016  . Preventative  health care 09/04/2014  . Borderline diabetes 09/19/2013  . Hypertensive retinopathy 03/29/2013  . Pterygium of left eye 03/29/2013  . GERD (gastroesophageal reflux disease) 05/19/2011  . Hyperlipidemia 05/02/2007  . ALLERGIC RHINITIS 09/06/2006  . Hypothyroidism 03/20/2006  . Severe obesity (BMI >= 40) ([place]) 03/20/2006  . VENOUS INSUFFICIENCY, CHRONIC 03/20/2006  . LOW BACK PAIN 03/20/2006  . BREAST CANCER, HX OF 03/20/2006  . Essential hypertension 12/13/2005  . Osteoarthritis 12/13/2005    Past Surgical History:  Procedure Laterality Date  . ABDOMINAL HYSTERECTOMY    . BREAST LUMPECTOMY    . HERNIA REPAIR    . HIP CLOSED REDUCTION Right 11/26/2014   Procedure: CLOSED REDUCTIION RIGHT HIP;  Surgeon: Newt Minion, MD;  Location: [facility];  Service: Orthopedics;  Laterality: Right;  . INCISIONAL HERNIA REPAIR N/A 10/29/2013   Procedure: OPEN REPAIR OF RECURRENT INCISIONAL HERNIA WITH MESH;  Surgeon: Zenovia Jarred, MD;  Location: [facility];  Service: General;  Laterality: N/A;  . TOTAL HIP ARTHROPLASTY      OB History    No data available       Home Medications    Prior to Admission medications   Medication Sig Start Date End Date Taking? Authorizing Provider  acetaminophen-codeine (TYLENOL #3) 300-30 MG tablet Take 1 tablet by mouth every 4 (four) hours as needed for moderate pain. 11/11/15   Nischal Narendra, MD  albuterol (PROVENTIL HFA;VENTOLIN HFA) 108 (90 Base) MCG/ACT inhaler Inhale 2 puffs  into the lungs every 6 (six) hours as needed for wheezing. 11/11/15 11/20/16  Nischal Narendra, MD  amLODipine (NORVASC) 10 MG tablet Take 1 tablet (10 mg total) by mouth daily. 01/12/16   Nischal Narendra, MD  atorvastatin (LIPITOR) 40 MG tablet Take 1 tablet (40 mg total) by mouth daily. 11/19/15   Aldine Contes, MD  calcium citrate-vitamin D (CITRACAL+D) 315-200 MG-UNIT tablet Take 1 tablet by mouth 2 (two) times daily. 11/11/15   Aldine Contes, MD  carvedilol (COREG) 6.25 MG tablet  Take 1 tablet (6.25 mg total) by mouth 2 (two) times daily with a meal. 02/19/15   Oval Linsey, MD  cyclobenzaprine (FLEXERIL) 5 MG tablet Take 1 tablet (5 mg total) by mouth 3 (three) times daily as needed for muscle spasms. 01/12/16   Aldine Contes, MD  diclofenac sodium (VOLTAREN) 1 % GEL Apply 2 g topically as needed. 11/11/15   Aldine Contes, MD  ibuprofen (ADVIL,MOTRIN) 400 MG tablet Take 1 tablet (400 mg total) by mouth 3 (three) times daily. 01/12/16   Nischal Narendra, MD  lisinopril (PRINIVIL,ZESTRIL) 40 MG tablet Take 1 tablet (40 mg total) by mouth daily. 11/19/15   Aldine Contes, MD  Magnesium Oxide 500 MG CAPS Take 1 capsule (500 mg total) by mouth 2 (two) times daily. 12/16/14   Riccardo Dubin, MD  pantoprazole (PROTONIX) 40 MG tablet Take 1 tablet (40 mg total) by mouth daily. 12/18/15   Nischal Narendra, MD  potassium chloride (K-DUR) 10 MEQ tablet Take 1 tablet (10 mEq total) by mouth daily. 01/13/16   Sid Falcon, MD    Family History Family History  Problem Relation Age of Onset  . Hypertension Mother   . Alzheimer's disease Mother   . Diabetes Sister     Social History Social History  Substance Use Topics  . Smoking status: Former Smoker    Quit date: 09/09/1958  . Smokeless tobacco: Never Used  . Alcohol use No     Allergies   Acyclovir and related; Metoprolol; and Food   Review of Systems Review of Systems  All other systems reviewed and are negative.    Physical Exam Updated Vital Signs BP 143/87   Pulse 74   Temp 97.9 F (36.6 C)   Resp 17   Wt 250 lb (113.4 kg)   SpO2 100%   BMI 45.73 kg/m   Physical Exam  Constitutional: She is oriented to person, place, and time. She appears well-developed.  Morbidly obese  HENT:  Head: Normocephalic and atraumatic.  Eyes: Conjunctivae and EOM are normal. Pupils are equal, round, and reactive to light.  Neck: Normal range of motion and phonation normal. Neck supple.  Cardiovascular: Normal  rate and regular rhythm.   Pulmonary/Chest: Effort normal and breath sounds normal. She exhibits no tenderness.  Abdominal: Soft. She exhibits no distension. There is no tenderness. There is no guarding.  Musculoskeletal: Normal range of motion.  Right leg shortened and internally rotated. Mild right hip pain.  Neurological: She is alert and oriented to person, place, and time. She exhibits normal muscle tone.  Skin: Skin is warm and dry.  Psychiatric: She has a normal mood and affect. Her behavior is normal. Judgment and thought content normal.  Nursing note and vitals reviewed.    ED Treatments / Results  Labs (all labs ordered are listed, but only abnormal results are displayed) Labs Reviewed  BASIC METABOLIC PANEL - Abnormal; Notable for the following:       Result Value  Potassium 3.2 (*)    Glucose, Bld 159 (*)    Calcium 8.7 (*)    All other components within normal limits  CBC WITH DIFFERENTIAL/PLATELET - Abnormal; Notable for the following:    Hemoglobin 11.1 (*)    HCT 34.5 (*)    MCH 25.3 (*)    All other components within normal limits    EKG  EKG Interpretation None       Radiology Dg Chest 2 View  Result Date: 01/28/2016 CLINICAL DATA:  Hypoxia EXAM: CHEST  2 VIEW COMPARISON:  10/27/2013; 10/25/2013; 06/28/2011 FINDINGS: Grossly unchanged cardiac silhouette and mediastinal contours given persistently reduced lung volumes. Atherosclerotic plaque in the thoracic aorta. There is persistent mild elevation / eventration involving the right hemidiaphragm. Evaluation of the retrosternal clear space obscured secondary to overlying soft tissues. Overall improved aeration of the lungs with minimal residual bibasilar opacities, left greater than right, likely atelectasis. No pleural effusion or pneumothorax. No evidence of edema. No acute osseus abnormalities. The right humeral head is high-riding in relation to the undersurface of the acromion, likely indicative of  rotator cuff pathology, incompletely evaluated. IMPRESSION: 1. Improved aeration of the lungs with persistent findings of hypoventilation bibasilar atelectasis, left greater than right. 2.  Aortic Atherosclerosis (ICD10-170.0) Electronically Signed   By: Sandi Mariscal M.D.   On: 01/28/2016 10:16   Dg Hip Port Unilat W Or Wo Pelvis 1 View Right  Result Date: 01/28/2016 CLINICAL DATA:  Hip dislocation EXAM: DG HIP (WITH OR WITHOUT PELVIS) 1V PORT RIGHT COMPARISON:  01/28/2016 FINDINGS: Interval reduction of RIGHT hip dislocation. Chronic avulsion of the lesser trochanter. No acute fracture. IMPRESSION: Interval reduction of RIGHT hip dislocation. Electronically Signed   By: Suzy Bouchard M.D.   On: 01/28/2016 11:15   Dg Hip Unilat  With Pelvis 2-3 Views Right  Result Date: 01/28/2016 CLINICAL DATA:  History of right hip replacement on prior dislocation, now with right hip pain. EXAM: DG HIP (WITH OR WITHOUT PELVIS) 2-3V RIGHT COMPARISON:  11/26/2014 FINDINGS: Recurrent dislocation involving of right total hip replacement with femoral foreshortening and angulation. No definite fracture or evidence of perihardware loosening. Regional soft tissues appear normal. IMPRESSION: Recurrent right total hip replacement dislocation without evidence of fracture, similar to the 11/2014 examination. Electronically Signed   By: Sandi Mariscal M.D.   On: 01/28/2016 10:14    Procedures .Sedation Date/Time: 01/28/2016 10:25 AM Performed by: Daleen Bo Authorized by: Daleen Bo   Consent:    Consent obtained:  Verbal and written   Consent given by:  Patient   Risks discussed:  Inadequate sedation, respiratory compromise necessitating ventilatory assistance and intubation, prolonged sedation necessitating reversal and prolonged hypoxia resulting in organ damage   Alternatives discussed:  Analgesia without sedation Indications:    Procedure performed:  Dislocation reduction   Procedure necessitating sedation  performed by:  Physician performing sedation (Mr. Schorn, [place])   Intended level of sedation:  Deep Pre-sedation assessment:    Time since last food or drink:  12 hours   ASA classification: class 3 - patient with severe systemic disease     Mouth opening:  3 or more finger widths   Mallampati score:  III - soft palate, base of uvula visible   Pre-sedation assessments completed and reviewed: airway patency, cardiovascular function, hydration status and mental status     Pre-sedation assessments completed and reviewed: nausea/vomiting not reviewed     History of difficult intubation: no     Pre-sedation assessment completed:  01/28/2016 10:30 AM Immediate pre-procedure details:    Reassessment: Patient reassessed immediately prior to procedure     Reviewed: vital signs     Verified: bag valve mask available, emergency equipment available and IV patency confirmed   Procedure details (see MAR for exact dosages):    Sedation start time:  01/28/2016 10:30 AM   Preoxygenation:  Nasal cannula   Sedation:  Ketamine (and Propofol)   Intra-procedure monitoring:  Blood pressure monitoring, cardiac monitor, continuous capnometry and continuous pulse oximetry   Intra-procedure events: none     Sedation end time:  01/28/2016 10:58 AM   Total sedation time (minutes):  35 Post-procedure details:    Post-sedation assessment completed:  01/28/2016 10:58 AM   Attendance: Constant attendance by certified staff until patient recovered     Recovery: Patient returned to pre-procedure baseline     Estimated blood loss (see I/O flowsheets): yes     Complications:  Skin tear right ASIS region, point of counter traction   Post-sedation assessments completed and reviewed: airway patency, cardiovascular function, hydration status and mental status     Post-sedation assessments completed and reviewed: nausea/vomiting not reviewed and pain level not reviewed     Patient is stable for discharge or admission: no      Patient tolerance:  Tolerated well, no immediate complications Comments:     Skin Tear not bleeding. Reduction of dislocation Date/Time: 01/28/2016 11:03 AM Performed by: Daleen Bo Authorized by: Daleen Bo  Consent: Verbal consent obtained. Written consent obtained. Consent given by: patient Patient understanding: patient states understanding of the procedure being performed Patient consent: the patient's understanding of the procedure matches consent given Procedure consent: procedure consent matches procedure scheduled Relevant documents: relevant documents present and verified Test results: test results available and properly labeled Site marked: the operative site was not marked Imaging studies: imaging studies available Patient identity confirmed: verbally with patient, hospital-assigned identification number and arm band Time out: Immediately prior to procedure a "time out" was called to verify the correct patient, procedure, equipment, support staff and site/side marked as required. Local anesthesia used: no  Anesthesia: Local anesthesia used: no  Sedation: Patient sedated: yes Sedatives: etomidate (and Propofol) Analgesia: fentanyl Sedation start date/time: 01/28/2016 10:25 AM Sedation end date/time: 01/28/2016 11:05 AM Vitals: Vital signs were monitored during sedation. Patient tolerance: Patient tolerated the procedure well with no immediate complications    (including critical care time)  Medications Ordered in ED Medications  bacitracin ointment 1 application (1 application Topical Given 01/28/16 1110)  fentaNYL (SUBLIMAZE) injection 50 mcg (50 mcg Intravenous Given 01/28/16 0951)  propofol (DIPRIVAN) 10 mg/mL bolus/IV push 56.7 mg (50 mg Intravenous Given by Other 01/28/16 1028)  ketamine 100 mg in normal saline 10 mL (10mg /mL) syringe (50 mg Intravenous Given by Other 01/28/16 1028)  acetaminophen (TYLENOL) tablet 650 mg (650 mg Oral Given 01/28/16  1208)     Initial Impression / Assessment and Plan / ED Course  I have reviewed the triage vital signs and the nursing notes.  Pertinent labs & imaging results that were available during my care of the patient were reviewed by me and considered in my medical decision making (see chart for details).  Clinical Course     Medications  bacitracin ointment 1 application (1 application Topical Given 01/28/16 1110)  fentaNYL (SUBLIMAZE) injection 50 mcg (50 mcg Intravenous Given 01/28/16 0951)  propofol (DIPRIVAN) 10 mg/mL bolus/IV push 56.7 mg (50 mg Intravenous Given by Other 01/28/16 1028)  ketamine 100 mg  in normal saline 10 mL (10mg /mL) syringe (50 mg Intravenous Given by Other 01/28/16 1028)  acetaminophen (TYLENOL) tablet 650 mg (650 mg Oral Given 01/28/16 1208)    Patient Vitals for the past 24 hrs:  BP Temp Pulse Resp SpO2 Weight  01/28/16 1130 143/87 - 74 17 100 % -  01/28/16 1055 157/91 - 76 22 99 % -  01/28/16 1050 163/92 - 80 20 98 % -  01/28/16 1045 164/95 - 83 20 96 % -  01/28/16 1040 145/87 - 79 (!) 27 96 % -  01/28/16 1035 134/92 - 76 23 98 % -  01/28/16 1030 120/69 - 77 22 97 % -  01/28/16 1027 130/85 - 75 16 98 % -  01/28/16 0917 - - - - - 250 lb (113.4 kg)  01/28/16 0914 130/84 97.9 F (36.6 C) 84 18 (!) 86 % -    At D/C Reevaluation with update and discussion. After initial assessment and treatment, an updated evaluation reveals She remains comfortable. No additional complaints. Findings discussed with patient and family member, all questions answered. Joziyah Roblero L    Final Clinical Impressions(s) / ED Diagnoses   Final diagnoses:  Dislocation of right hip, initial encounter ([place])  Abrasion    Prosthetic hip dislocation, reduced. Minor complication. Postprocedure of skin abrasion right pre-pelvic region. Patient was informed of the finding, with explanation.  Nursing Notes Reviewed/ Care Coordinated Applicable Imaging Reviewed Interpretation of  Laboratory Data incorporated into ED treatment  The patient appears reasonably screened and/or stabilized for discharge and I doubt any other medical condition or other [facility] requiring further screening, evaluation, or treatment in the ED at this time prior to discharge.  Plan: Home Medications- continue; Home Treatments- rest, keep knee immobilizer on; return here if the recommended treatment, does not improve the symptoms; Recommended follow up- orthopedic follow-up one week, and as needed.     New Prescriptions Discharge Medication List as of 01/28/2016 11:46 AM       Daleen Bo, MD 01/28/16 1224

## 2016-01-28 NOTE — Sedation Documentation (Signed)
Pt sleepy, but still responsive. MD gave 2.49ml more of propofol/ketamine mix.

## 2016-01-28 NOTE — Sedation Documentation (Signed)
Pt minimally responsive. Reduction begun by PA.

## 2016-01-28 NOTE — Sedation Documentation (Signed)
MD made mix of 100mg  propofol and 100mg  ketamine in one syringe. MD gave 43ml of mixture at this time.

## 2016-01-28 NOTE — Sedation Documentation (Signed)
Reduction resumed.

## 2016-01-28 NOTE — ED Notes (Signed)
Pt in xray

## 2016-01-28 NOTE — Sedation Documentation (Signed)
Pop finally heard. R leg has more length than before.

## 2016-02-16 ENCOUNTER — Other Ambulatory Visit: Payer: Self-pay | Admitting: Internal Medicine

## 2016-02-16 MED ORDER — ATORVASTATIN CALCIUM 40 MG PO TABS: 40.0000 mg | ORAL_TABLET | Freq: Every day | ORAL | 0 refills | Status: DC

## 2016-02-16 NOTE — Telephone Encounter (Signed)
Refill Request     atorvastatin (LIPITOR) 40 MG tablet

## 2016-03-09 ENCOUNTER — Encounter (INDEPENDENT_AMBULATORY_CARE_PROVIDER_SITE_OTHER): Payer: PPO | Admitting: Ophthalmology

## 2016-03-14 ENCOUNTER — Encounter: Payer: Self-pay | Admitting: *Deleted

## 2016-03-23 ENCOUNTER — Encounter (INDEPENDENT_AMBULATORY_CARE_PROVIDER_SITE_OTHER): Payer: PPO | Admitting: Ophthalmology

## 2016-03-29 ENCOUNTER — Telehealth: Payer: Self-pay | Admitting: Internal Medicine

## 2016-03-29 NOTE — Telephone Encounter (Signed)
APT. REMINDER CALL, LMTCB °

## 2016-03-30 ENCOUNTER — Encounter: Payer: Self-pay | Admitting: Internal Medicine

## 2016-03-30 ENCOUNTER — Ambulatory Visit (INDEPENDENT_AMBULATORY_CARE_PROVIDER_SITE_OTHER): Payer: PPO | Admitting: Internal Medicine

## 2016-03-30 VITALS — BP 142/88 | HR 83 | Temp 98.2°F | Ht 62.0 in | Wt 252.4 lb

## 2016-03-30 DIAGNOSIS — T84029A Dislocation of unspecified internal joint prosthesis, initial encounter: Secondary | ICD-10-CM | POA: Insufficient documentation

## 2016-03-30 DIAGNOSIS — K219 Gastro-esophageal reflux disease without esophagitis: Secondary | ICD-10-CM

## 2016-03-30 DIAGNOSIS — T84020D Dislocation of internal right hip prosthesis, subsequent encounter: Secondary | ICD-10-CM

## 2016-03-30 DIAGNOSIS — Y792 Prosthetic and other implants, materials and accessory orthopedic devices associated with adverse incidents: Secondary | ICD-10-CM | POA: Diagnosis not present

## 2016-03-30 DIAGNOSIS — M170 Bilateral primary osteoarthritis of knee: Secondary | ICD-10-CM | POA: Diagnosis not present

## 2016-03-30 DIAGNOSIS — Z96649 Presence of unspecified artificial hip joint: Secondary | ICD-10-CM

## 2016-03-30 DIAGNOSIS — R7303 Prediabetes: Secondary | ICD-10-CM

## 2016-03-30 DIAGNOSIS — M19011 Primary osteoarthritis, right shoulder: Secondary | ICD-10-CM

## 2016-03-30 DIAGNOSIS — M545 Low back pain, unspecified: Secondary | ICD-10-CM

## 2016-03-30 DIAGNOSIS — T84029D Dislocation of unspecified internal joint prosthesis, subsequent encounter: Secondary | ICD-10-CM

## 2016-03-30 DIAGNOSIS — I10 Essential (primary) hypertension: Secondary | ICD-10-CM

## 2016-03-30 DIAGNOSIS — E039 Hypothyroidism, unspecified: Secondary | ICD-10-CM | POA: Diagnosis not present

## 2016-03-30 DIAGNOSIS — E876 Hypokalemia: Secondary | ICD-10-CM

## 2016-03-30 DIAGNOSIS — Z87891 Personal history of nicotine dependence: Secondary | ICD-10-CM

## 2016-03-30 DIAGNOSIS — Z79899 Other long term (current) drug therapy: Secondary | ICD-10-CM

## 2016-03-30 LAB — POCT GLYCOSYLATED HEMOGLOBIN (HGB A1C): Hemoglobin A1C: 6.1

## 2016-03-30 LAB — GLUCOSE, CAPILLARY: Glucose-Capillary: 113 mg/dL — ABNORMAL HIGH (ref 65–99)

## 2016-03-30 MED ORDER — DICLOFENAC SODIUM 1 % TD GEL: 2.0000 g | TRANSDERMAL | 3 refills | Status: DC | PRN

## 2016-03-30 MED ORDER — LISINOPRIL 40 MG PO TABS: 40.0000 mg | ORAL_TABLET | Freq: Every day | ORAL | 0 refills | Status: DC

## 2016-03-30 MED ORDER — CARVEDILOL 6.25 MG PO TABS: 6.2500 mg | ORAL_TABLET | Freq: Two times a day (BID) | ORAL | 3 refills | Status: DC

## 2016-03-30 MED ORDER — ACETAMINOPHEN-CODEINE #3 300-30 MG PO TABS: 1.0000 | ORAL_TABLET | ORAL | 0 refills | Status: DC | PRN

## 2016-03-30 MED ORDER — ALBUTEROL SULFATE HFA 108 (90 BASE) MCG/ACT IN AERS: 2.0000 | INHALATION_SPRAY | Freq: Four times a day (QID) | RESPIRATORY_TRACT | 0 refills | Status: DC | PRN

## 2016-03-30 MED ORDER — AMLODIPINE BESYLATE 10 MG PO TABS: 10.0000 mg | ORAL_TABLET | Freq: Every day | ORAL | 0 refills | Status: DC

## 2016-03-30 NOTE — Assessment & Plan Note (Signed)
-   Patient with bilateral knee and R shoulder pain likely secondary to OA - Pain is well controlled on tylenol 3 and voltaren gel - Refilled both today

## 2016-03-30 NOTE — Assessment & Plan Note (Signed)
-   Patient with persistent symptoms but pain is well controlled on OTC tylenol and advil - However patient does complain of severe pain intermittently requiring tylenol 3 to enable her to do her ADLs. - Will refill Tylenol 3 today

## 2016-03-30 NOTE — Assessment & Plan Note (Signed)
-   symptoms are well controlled on protonix - Will continue with protonix for now

## 2016-03-30 NOTE — Progress Notes (Signed)
   Subjective:    Patient ID: [name], female    DOB: 07/23/1935, 81 y.o.   MRN: [id number]  HPI  I have seen and examined the patient. She is here for routine follow up of her HTN and borderline DM. She feels well currently and has no new complaints. She did dislocate her R hip prosthesis on 11/23 and was seen in the ED and had it reduced but has not followed up with her orthopedic doctor since then and still complains of some right hip pain. She also states she noted some yellowish discharge from her R heel/ankle which was scant and started after her hip dislocation. She states this has resolved on its own and did not notice any cuts or open wound on her foot.    Review of Systems  Constitutional: Negative.   HENT: Negative.   Respiratory: Negative.   Cardiovascular: Negative.   Gastrointestinal: Negative.   Musculoskeletal: Positive for arthralgias and gait problem. Negative for joint swelling and myalgias.  Skin: Negative.   Psychiatric/Behavioral: Negative.        Objective:   Physical Exam  Constitutional: She is oriented to person, place, and time. She appears well-developed and well-nourished.  HENT:  Head: Normocephalic and atraumatic.  Mouth/Throat: Oropharynx is clear and moist. No oropharyngeal exudate.  Neck: Neck supple.  Cardiovascular: Normal rate, regular rhythm and normal heart sounds.   Pulmonary/Chest: Effort normal and breath sounds normal. No respiratory distress. She has no wheezes.  Abdominal: Soft. Bowel sounds are normal. She exhibits no distension. There is no tenderness.  Musculoskeletal: Normal range of motion. She exhibits no edema.  No discharge noted from R heel and no open wound noted  Lymphadenopathy:    She has no cervical adenopathy.  Neurological: She is alert and oriented to person, place, and time.  Skin: Skin is warm. No rash noted. No erythema.  Psychiatric: She has a normal mood and affect. Her behavior is normal.            Assessment & Plan:  Please see problem based charting for assessment and plan:

## 2016-03-30 NOTE — Assessment & Plan Note (Signed)
-   Patient was noted to be hypokalemic back in November and was prescribed a short course of KCl - She has not had a repeat BMET since - Will check a BMET today and supplement K if necessary

## 2016-03-30 NOTE — Assessment & Plan Note (Signed)
BP Readings from Last 3 Encounters:  03/30/16 (!) 142/88  01/28/16 143/87  01/12/16 (!) 170/99    Lab Results  Component Value Date   NA 138 01/28/2016   K 3.2 (L) 01/28/2016   CREATININE 0.81 01/28/2016    Assessment: Blood pressure control:  fair Progress toward BP goal:   same Comments: compliant with lisinopril 40 mg, carvedilol 6.25 mg and amlodipine 10 mg  Plan: Medications:  continue current medications Educational resources provided: brochure (denies need) Self management tools provided:   Other plans: will check BMP

## 2016-03-30 NOTE — Assessment & Plan Note (Signed)
-   Patient with likely subclinical hypothyroidism with normal free T4 and high TSH - remains asymptomatic off meds - Will recheck TSH and free T4 today

## 2016-03-30 NOTE — Assessment & Plan Note (Signed)
-   Patient with persistent mild pain in R hip but able to ambulate - Has not followed up with Dr. Sharol Given since the dislocation - Appointment made for patient on Feb 1st for follow up with ortho - c/w pain control with OTC NSAIDs and voltaren gel and tylenol 3 prn

## 2016-03-30 NOTE — Assessment & Plan Note (Signed)
-   patient with borderline DM  - repeat A1C today is 6.1  - Will monitor every 6 months

## 2016-03-30 NOTE — Patient Instructions (Addendum)
-   It was a pleasure seeing you today - I have refilled your medications - We will get some bloodwork on you today - Please follow up in 3 months - Please follow up with Dr. Sharol Given for your hip

## 2016-03-31 ENCOUNTER — Telehealth: Payer: Self-pay | Admitting: Internal Medicine

## 2016-03-31 LAB — BMP8+ANION GAP
Anion Gap: 19 mmol/L — ABNORMAL HIGH (ref 10.0–18.0)
BUN/Creatinine Ratio: 17 (ref 12–28)
BUN: 13 mg/dL (ref 8–27)
CALCIUM: 9.3 mg/dL (ref 8.7–10.3)
CHLORIDE: 99 mmol/L (ref 96–106)
CO2: 25 mmol/L (ref 18–29)
Creatinine, Ser: 0.78 mg/dL (ref 0.57–1.00)
GFR calc non Af Amer: 73 mL/min/{1.73_m2} (ref >59)
GFR, EST AFRICAN AMERICAN: 84 mL/min/{1.73_m2} (ref >59)
GLUCOSE: 109 mg/dL — AB (ref 65–99)
POTASSIUM: 3.3 mmol/L — AB (ref 3.5–5.2)
Sodium: 143 mmol/L (ref 134–144)

## 2016-03-31 LAB — T4, FREE: FREE T4: 1.44 ng/dL (ref 0.82–1.77)

## 2016-03-31 LAB — TSH: TSH: 4.53 u[IU]/mL — AB (ref 0.450–4.500)

## 2016-03-31 MED ORDER — POTASSIUM CHLORIDE ER 10 MEQ PO TBCR: 10.0000 meq | EXTENDED_RELEASE_TABLET | Freq: Every day | ORAL | 0 refills | Status: DC

## 2016-03-31 NOTE — Telephone Encounter (Signed)
I called patient to discuss the results of her blood work with her. Her potassium level was still noted to be mildly low. Will give her KCl 10 meq * 3 days and recheck her BMP on her next appointment. Her TSH is only mildly elevated and her free T4 is normal. Will hold off on any medication for now. Patient expresses understanding and is in agreement with the plan.

## 2016-04-04 ENCOUNTER — Ambulatory Visit (INDEPENDENT_AMBULATORY_CARE_PROVIDER_SITE_OTHER): Payer: PPO | Admitting: Orthopedic Surgery

## 2016-04-07 ENCOUNTER — Ambulatory Visit (INDEPENDENT_AMBULATORY_CARE_PROVIDER_SITE_OTHER): Payer: PPO | Admitting: Orthopedic Surgery

## 2016-04-11 ENCOUNTER — Encounter (INDEPENDENT_AMBULATORY_CARE_PROVIDER_SITE_OTHER): Payer: PPO | Admitting: Ophthalmology

## 2016-04-11 ENCOUNTER — Ambulatory Visit (INDEPENDENT_AMBULATORY_CARE_PROVIDER_SITE_OTHER): Payer: PPO | Admitting: Orthopedic Surgery

## 2016-04-11 DIAGNOSIS — H43813 Vitreous degeneration, bilateral: Secondary | ICD-10-CM

## 2016-04-11 DIAGNOSIS — H35033 Hypertensive retinopathy, bilateral: Secondary | ICD-10-CM | POA: Diagnosis not present

## 2016-04-11 DIAGNOSIS — I10 Essential (primary) hypertension: Secondary | ICD-10-CM | POA: Diagnosis not present

## 2016-04-11 DIAGNOSIS — H34832 Tributary (branch) retinal vein occlusion, left eye, with macular edema: Secondary | ICD-10-CM

## 2016-04-11 DIAGNOSIS — H2511 Age-related nuclear cataract, right eye: Secondary | ICD-10-CM

## 2016-04-12 ENCOUNTER — Encounter (INDEPENDENT_AMBULATORY_CARE_PROVIDER_SITE_OTHER): Payer: Self-pay | Admitting: Orthopedic Surgery

## 2016-04-12 ENCOUNTER — Other Ambulatory Visit: Payer: Self-pay | Admitting: Internal Medicine

## 2016-04-12 ENCOUNTER — Telehealth (INDEPENDENT_AMBULATORY_CARE_PROVIDER_SITE_OTHER): Payer: Self-pay | Admitting: Family

## 2016-04-12 ENCOUNTER — Ambulatory Visit (INDEPENDENT_AMBULATORY_CARE_PROVIDER_SITE_OTHER): Payer: PPO

## 2016-04-12 ENCOUNTER — Ambulatory Visit (INDEPENDENT_AMBULATORY_CARE_PROVIDER_SITE_OTHER): Payer: PPO | Admitting: Family

## 2016-04-12 VITALS — Ht 62.0 in | Wt 252.0 lb

## 2016-04-12 DIAGNOSIS — M5441 Lumbago with sciatica, right side: Secondary | ICD-10-CM

## 2016-04-12 MED ORDER — PREDNISONE 10 MG PO TABS: ORAL_TABLET | ORAL | 0 refills | Status: DC

## 2016-04-12 NOTE — Progress Notes (Signed)
Office Visit Note   Patient: [name]           Date of Birth: 08-30-1935           MRN: [id number] Visit Date: 04/12/2016              Requested by: Aldine Contes, MD [place], [place], [place] [address] PCP: Aldine Contes, MD  No chief complaint on file.   HPI: Patient complains of right lateral hip pain that radiates down to her foot. She does not have any numbness or tingling but does state that her leg has been swollen and there is a very small open area to the medial side of the right ankle. She states that she is not able to sleep on this side and that she has to lean over and rest her weight on her forearms on a counter top to take  Pressure off of her back. Pamella Pert, RMA    The patient is a 81 year old woman who presents today for evaluation of right hip pain that radiates down to the lateral side of her foot. No numbness or tingling. Notes that when she leans off to the left or leans bends over resting on her forearms on the countertop she has relief of her pain.  Does complain of swelling to bilateral lower extremities as well. Noticed a small ulceration to the medial side of the right ankle this morning. Feels that she was to aggressive rubbing and cleaning in the bath this morning.    Assessment & Plan: Visit Diagnoses:  1. Acute right-sided low back pain with right-sided sciatica     Plan: Have provided a prescription for prednisone. She'll follow up in office in 4 weeks. Have recommended that she purchased some new compression stockings for him bilateral lower extremity swelling. Where these daily.  Follow-Up Instructions: No Follow-up on file.   Physical Exam  Constitutional: Appears well-developed. Obese. Head: Normocephalic.  Eyes: EOM are normal.  Neck: Normal range of motion.  Cardiovascular: Normal rate.   Pulmonary/Chest: Effort normal.  Neurological: Is alert.  Skin: Skin is warm.  Psychiatric: Has a normal mood and  affect. Bilateral lower extremities with 2+ pitting edema up to the tibial tubercle. Medial right ankle with a 5 mm in diameter superficial ulceration this is covered with dried blood. There is no surrounding erythema odor no sign of infection. Does have venous stasis color changes to the bilateral lower extremities.  Back Exam   Tenderness  The patient is experiencing no tenderness.   Range of Motion  The patient has normal back ROM.  Muscle Strength  The patient has normal back strength.  Tests  Right straight leg raise test: Him limited exam due to body habitus.      Imaging: No results found.  Orders:  Orders Placed This Encounter  Procedures  . XR Lumbar Spine 2-3 Views   No orders of the defined types were placed in this encounter.    Procedures: No procedures performed  Clinical Data: No additional findings.  Subjective: Review of Systems  Objective: Vital Signs: There were no vitals taken for this visit.  Specialty Comments:  No specialty comments available.  PMFS History: Patient Active Problem List   Diagnosis Date Noted  . Recurrent dislocation of hip joint prosthesis ([place]) 03/30/2016  . Preventative health care 09/04/2014  . Borderline diabetes 09/19/2013  . Hypertensive retinopathy 03/29/2013  . Hypokalemia 06/27/2011  . GERD (gastroesophageal reflux disease) 05/19/2011  .  Hyperlipidemia 05/02/2007  . ALLERGIC RHINITIS 09/06/2006  . Hypothyroidism 03/20/2006  . Severe obesity (BMI >= 40) ([place]) 03/20/2006  . LOW BACK PAIN 03/20/2006  . BREAST CANCER, HX OF 03/20/2006  . Essential hypertension 12/13/2005  . Osteoarthritis 12/13/2005   Past Medical History:  Diagnosis Date  . Allergic rhinitis, cause unspecified   . Asthma   . Cancer ([place])   . Chronic back pain   . GERD (gastroesophageal reflux disease) 05/19/2011  . history of Bilateral leg edema-likely amlodipine induced. 07/28/2011  . history of CAP (community acquired pneumonia)  07/14/2011  . history of HYPOTHYROIDISM, BORDERLINE 03/20/2006   Annotation: Asymptomatic, untreated Qualifier: Diagnosis of  By: Tomasa Hosteller MD, Edmon Crape.   . Hx of breast cancer   . Hyperlipidemia   . Hypertension   . Hypothyroidism   . Lumbago   . OBESITY NOS 03/20/2006   Qualifier: Diagnosis of  By: Tomasa Hosteller MD, [place] 12/13/2005   Annotation: bilateral knees;right knee injection 6/05 Qualifier: Diagnosis of  By: Tomasa Hosteller MD, Veronique D.   . right shoulder pain, likely Impingement syndrome  09/09/2010  . VENOUS INSUFFICIENCY, CHRONIC 03/20/2006   Qualifier: Diagnosis of  By: Tomasa Hosteller MD, Edmon Crape.     Family History  Problem Relation Age of Onset  . Hypertension Mother   . Alzheimer's disease Mother   . Diabetes Sister     Past Surgical History:  Procedure Laterality Date  . ABDOMINAL HYSTERECTOMY    . BREAST LUMPECTOMY    . HERNIA REPAIR    . HIP CLOSED REDUCTION Right 11/26/2014   Procedure: CLOSED REDUCTIION RIGHT HIP;  Surgeon: Newt Minion, MD;  Location: [facility];  Service: Orthopedics;  Laterality: Right;  . INCISIONAL HERNIA REPAIR N/A 10/29/2013   Procedure: OPEN REPAIR OF RECURRENT INCISIONAL HERNIA WITH MESH;  Surgeon: Zenovia Jarred, MD;  Location: [facility];  Service: General;  Laterality: N/A;  . TOTAL HIP ARTHROPLASTY     Social History   Occupational History  . not working    Social History Main Topics  . Smoking status: Former Smoker    Quit date: 09/09/1958  . Smokeless tobacco: Never Used  . Alcohol use No  . Drug use: No  . Sexual activity: Not on file

## 2016-04-12 NOTE — Telephone Encounter (Signed)
[facility] called regarding clarification of prescription info for Patient [name].  Cb#[phone].  Thank you.

## 2016-04-12 NOTE — Telephone Encounter (Signed)
Patient was on HCTZ in the past but was stopped secondary to hypokalemia. Given her history of hypokalemia I would be hesitant to prescribe HCTZ at this time. Please have patient follow up in [facility] so we can better assess her swelling

## 2016-04-12 NOTE — Telephone Encounter (Signed)
Patient stated she has noticed swelling on both feet mainly on her ankles since yesterday.  She said " I think I'm on a fluid pill but I don't know the name & the doctor said for me to call if I have problems so he can write me a prescription for swelling" She is unable to come for appt anytime soon due to transportation issues. Her pcp appt on 07/05/16. She has appt with orthopedics today. Pls. Advice,thanks!

## 2016-04-13 ENCOUNTER — Telehealth (INDEPENDENT_AMBULATORY_CARE_PROVIDER_SITE_OTHER): Payer: Self-pay | Admitting: *Deleted

## 2016-04-13 NOTE — Telephone Encounter (Signed)
I called and lm on vm I am not sure what the question was but the rx was for pred taper #42 tabs 10 mg I advised for them to call if they have any additional questions.

## 2016-04-13 NOTE — Telephone Encounter (Signed)
Patient called in this afternoon in regards to the issues with getting her prescription for her pain medicine. She would like a call back please at [phone]. Thank you

## 2016-04-14 NOTE — Telephone Encounter (Signed)
Called the pharm and advised that the pred taper 10 mg the rx that was faxed in was wrong.

## 2016-04-14 NOTE — Telephone Encounter (Signed)
Pt called stated she cant get her meds still, said pharmacy needs some kind of clarification?

## 2016-04-15 ENCOUNTER — Ambulatory Visit: Payer: PPO

## 2016-04-19 ENCOUNTER — Ambulatory Visit: Payer: PPO | Admitting: Internal Medicine

## 2016-04-19 ENCOUNTER — Ambulatory Visit (INDEPENDENT_AMBULATORY_CARE_PROVIDER_SITE_OTHER): Payer: PPO | Admitting: Internal Medicine

## 2016-04-19 VITALS — BP 169/89 | HR 72 | Temp 97.7°F | Ht 62.0 in | Wt 253.7 lb

## 2016-04-19 DIAGNOSIS — I872 Venous insufficiency (chronic) (peripheral): Secondary | ICD-10-CM

## 2016-04-19 DIAGNOSIS — M545 Low back pain, unspecified: Secondary | ICD-10-CM

## 2016-04-19 MED ORDER — ACETAMINOPHEN-CODEINE #3 300-30 MG PO TABS: 1.0000 | ORAL_TABLET | ORAL | 0 refills | Status: DC | PRN

## 2016-04-19 NOTE — Patient Instructions (Signed)
[name],  Your lower extremity swelling is likely secondary to venous insufficiency. You're doing a good job keeping the area on your ankle clean and dry. Remember to keep that leg elevated when possible. Please go to [facility] supply to be measured and obtained near compression garments. This will help bring the fluid from the bottom part of your leg upwards to avoid creating any ulcers or swelling in the leg. We will refill your pain medication today as well.

## 2016-04-19 NOTE — Progress Notes (Signed)
Internal Medicine Clinic Attending  Case discussed with Dr. Burns soon after the resident saw the patient.  We reviewed the resident's history and exam and pertinent patient test results.  I agree with the assessment, diagnosis, and plan of care documented in the resident's note. 

## 2016-04-19 NOTE — Assessment & Plan Note (Addendum)
Patient states she has a history of right ankle pain in the past, that previously resolved. However, in the last several months she has noticed worsening right ankle pain. She states the pain is on the inner portion of her right ankle. It hurts the most at night and when she walks. She states when she sits and rests he feels a throbbing pain. One month ago she noticed drainage from the medial portion of the right ankle which resolved. She then noticed lower extremity edema bilaterally which is also improved. On chart review, patient has a history of right ankle medial venous insufficiency ulcer secondary to chronic venous insufficiency. Patient recently saw her orthopedic surgeon who recommended compression stockings. Patient has not picked these up yet due to fear of cost. Patient has a history of right hip replacement. She denies any trauma to her right ankle. On exam, patient has a right medial ankle with chronic scarring and skin changes. 0.25 cm shallow opening, clean, dry. No drainage or odor noted. Mild tenderness on palpation of medial right ankle, but no fluctuance or erythema. Normal range of motion of right ankle and normal strength and sensation. Trace lower extremity edema bilaterally,  pedal pulses symmetric and intact bilaterally. No cyanosis or clubbing.  Assessment: Chronic venous insufficiency, at risk for recurrent venous insufficiency ulcer  Plan: -Provided with knee-high compression hose 20-30 mmHg  -Elevate lower extremities -Weight loss -Refilled pain medication -Keep area clean and dry -Follow-up in 2 months, sooner if noticing any erythema, increased drainage, increased pain, increased ulceration

## 2016-04-19 NOTE — Progress Notes (Signed)
    CC: Right ankle pain  HPI: [name] is a 81 y.o. female with PMHx of hypertension, GERD, hyperlipidemia who presents to the clinic for right ankle pain.   Patient states she has a history of right ankle pain in the past, that previously resolved. However, in the last several months she has noticed worsening right ankle pain. She states the pain is on the inner portion of her right ankle. It hurts the most at night and when she walks. She states when she sits and rests he feels a throbbing pain. One month ago she noticed drainage from the medial portion of the right ankle which resolved. She then noticed lower extremity edema bilaterally which is also improved. On chart review, patient has a history of right ankle medial venous insufficiency ulcer secondary to chronic venous insufficiency. Patient recently saw her orthopedic surgeon who recommended compression stockings. Patient has not picked these up yet due to fear of cost. Patient has a history of right hip replacement. She denies any trauma to her right ankle.  Past Medical History:  Diagnosis Date  . Allergic rhinitis, cause unspecified   . Asthma   . Cancer ([place])   . Chronic back pain   . GERD (gastroesophageal reflux disease) 05/19/2011  . history of Bilateral leg edema-likely amlodipine induced. 07/28/2011  . history of CAP (community acquired pneumonia) 07/14/2011  . history of HYPOTHYROIDISM, BORDERLINE 03/20/2006   Annotation: Asymptomatic, untreated Qualifier: Diagnosis of  By: Tomasa Hosteller MD, Edmon Crape.   . Hx of breast cancer   . Hyperlipidemia   . Hypertension   . Hypothyroidism   . Lumbago   . OBESITY NOS 03/20/2006   Qualifier: Diagnosis of  By: Tomasa Hosteller MD, [place] 12/13/2005   Annotation: bilateral knees;right knee injection 6/05 Qualifier: Diagnosis of  By: Tomasa Hosteller MD, Veronique D.   . right shoulder pain, likely Impingement syndrome  09/09/2010  . VENOUS INSUFFICIENCY, CHRONIC 03/20/2006   Qualifier: Diagnosis of  By: Tomasa Hosteller MD, Edmon Crape.     Review of Systems: Please see pertinent ROS reviewed in HPI and problem based charting.   Physical Exam: Vitals:   04/19/16 0958  BP: (!) 169/89  Pulse: 72  Temp: 97.7 F (36.5 C)  TempSrc: Oral  SpO2: 99%  Weight: 253 lb 11.2 oz (115.1 kg)  Height: 5\' 2"  (1.575 m)   General: Vital signs reviewed.  Patient is Elderly, obese, in no acute distress and cooperative with exam.  Cardiovascular: RRR Pulmonary/Chest: Clear to auscultation bilaterally, no wheezes, rales, or rhonchi. Abdominal: Soft, non-tender, non-distended, BS +, no masses, organomegaly, or guarding present.  Musculoskeletal: Right medial ankle with chronic scarring and skin changes. 0.25 cm shallow opening, clean, dry. No drainage or odor noted. Mild tenderness on palpation of medial right ankle, but no fluctuance or erythema. Normal range of motion of right ankle and normal strength and sensation. Extremities: Trace lower extremity edema bilaterally,  pedal pulses symmetric and intact bilaterally. No cyanosis or clubbing. Skin: Warm, dry and intact. No rashes or erythema. Psychiatric: Normal mood and affect. speech and behavior is normal. Cognition and memory are normal.       Assessment & Plan:  See encounters tab for problem based medical decision making. Patient discussed with Dr. Angelia Mould

## 2016-04-28 ENCOUNTER — Other Ambulatory Visit: Payer: Self-pay

## 2016-04-28 NOTE — Telephone Encounter (Signed)
amLODipine (NORVASC) 10 MG tablet, refill request @ [facility] on [address].

## 2016-04-28 NOTE — Telephone Encounter (Signed)
Pt has the 1/24 script for 90 days at pharm, called them they are filling and will notify her

## 2016-05-06 NOTE — Progress Notes (Signed)
Office Visit Note   Patient: [name]           Date of Birth: 03/06/1936           MRN: [id number] Visit Date: 05/09/2016              Requested by: Aldine Contes, MD [place], [place], [place] [address] PCP: Aldine Contes, MD  Chief Complaint  Patient presents with  . Lower Back - Pain, Follow-up    HPI: Follow up acute right sided low back pain. Given pred taper and states that this was minimally helpful. Pt complains of right sided radicular pain and that her ankle is " really hurting I need to know what's wrong with it" she describes shooting pain inner thigh and calf down to her ankle and this increases with walking and standing. Pamella Pert, RMA  Patient is a 81 year old woman seen today in follow up for 2 separate issues. Venous insufficiency with swelling and ulceration to the right ankle and right low back pain with sciatica.  Has been given compression stockings by her PCP. States she wears these on occasion. States when they become painful, she removes them. Today is in cotton ankle socks. Complains of burning and throbbing pain over medial ankle.   Right low back pain that radiates down buttock and posterior thigh. Some numbness in right foot. State the prednisone was helpful but did not completely resolve her pain. No weakness.     Assessment & Plan: Visit Diagnoses:  1. Right-sided low back pain with right-sided sciatica, unspecified chronicity   2. Chronic venous insufficiency     Plan: Encouraged her to wear the compression stockings around the clock. Elevate as able. Will proceed with MRI of lumbar spine. May send her to [facility] for [facility]. Follow up in office post mri.  Follow-Up Instructions: Return for p mri.   Physical Exam  Constitutional: Appears well-developed. Obese. Head: Normocephalic.  Eyes: EOM are normal.  Neck: Normal range of motion.  Cardiovascular: Normal rate.   Pulmonary/Chest: Effort normal.  Neurological:  Is alert.  Skin: Skin is warm.  Psychiatric: Has a normal mood and affect.  Back Exam   Tenderness  The patient is experiencing tenderness in the lumbar.  Range of Motion  The patient has normal back ROM.  Tests  Right straight leg raise test: deferred.     Right lower extremity with moderate pitting edema. Medial ulceration. Two areas with early epithelialization. These are 5 mm in diameter. No drainage. No erythema. No odor.   Imaging: No results found.  Labs: Lab Results  Component Value Date   HGBA1C 6.1 03/30/2016   HGBA1C 5.9 09/15/2015   HGBA1C 6.1 09/04/2014   ESRSEDRATE 44 (H) 09/09/2010   ESRSEDRATE 52 (H) 05/22/2008   ESRSEDRATE 73 (H) 03/26/2008   CRP 0.7 (H) 09/09/2010   REPTSTATUS 11/01/2013 FINAL 10/31/2013   CULT NO GROWTH Performed at [facility] 10/31/2013    Orders:  Orders Placed This Encounter  Procedures  . MR Lumbar Spine w/o contrast   No orders of the defined types were placed in this encounter.    Procedures: No procedures performed  Clinical Data: No additional findings.  Subjective: Review of Systems  Objective: Vital Signs: Ht 5\' 2"  (1.575 m)   Wt 253 lb (114.8 kg)   BMI 46.27 kg/m   Specialty Comments:  No specialty comments available.  PMFS History: Patient Active Problem List   Diagnosis  Date Noted  . Recurrent dislocation of hip joint prosthesis ([place]) 03/30/2016  . Preventative health care 09/04/2014  . Borderline diabetes 09/19/2013  . Hypertensive retinopathy 03/29/2013  . Hypokalemia 06/27/2011  . GERD (gastroesophageal reflux disease) 05/19/2011  . Hyperlipidemia 05/02/2007  . ALLERGIC RHINITIS 09/06/2006  . Hypothyroidism 03/20/2006  . Severe obesity (BMI >= 40) ([place]) 03/20/2006  . Chronic venous insufficiency 03/20/2006  . Right-sided low back pain with right-sided sciatica 03/20/2006  . BREAST CANCER, HX OF 03/20/2006  . Essential hypertension 12/13/2005  . Osteoarthritis 12/13/2005    Past Medical History:  Diagnosis Date  . Allergic rhinitis, cause unspecified   . Asthma   . Cancer ([place])   . Chronic back pain   . GERD (gastroesophageal reflux disease) 05/19/2011  . history of Bilateral leg edema-likely amlodipine induced. 07/28/2011  . history of CAP (community acquired pneumonia) 07/14/2011  . history of HYPOTHYROIDISM, BORDERLINE 03/20/2006   Annotation: Asymptomatic, untreated Qualifier: Diagnosis of  By: Tomasa Hosteller MD, Edmon Crape.   . Hx of breast cancer   . Hyperlipidemia   . Hypertension   . Hypothyroidism   . Lumbago   . OBESITY NOS 03/20/2006   Qualifier: Diagnosis of  By: Tomasa Hosteller MD, [place] 12/13/2005   Annotation: bilateral knees;right knee injection 6/05 Qualifier: Diagnosis of  By: Tomasa Hosteller MD, Veronique D.   . right shoulder pain, likely Impingement syndrome  09/09/2010  . VENOUS INSUFFICIENCY, CHRONIC 03/20/2006   Qualifier: Diagnosis of  By: Tomasa Hosteller MD, Edmon Crape.     Family History  Problem Relation Age of Onset  . Hypertension Mother   . Alzheimer's disease Mother   . Diabetes Sister     Past Surgical History:  Procedure Laterality Date  . ABDOMINAL HYSTERECTOMY    . BREAST LUMPECTOMY    . HERNIA REPAIR    . HIP CLOSED REDUCTION Right 11/26/2014   Procedure: CLOSED REDUCTIION RIGHT HIP;  Surgeon: Newt Minion, MD;  Location: [facility];  Service: Orthopedics;  Laterality: Right;  . INCISIONAL HERNIA REPAIR N/A 10/29/2013   Procedure: OPEN REPAIR OF RECURRENT INCISIONAL HERNIA WITH MESH;  Surgeon: Zenovia Jarred, MD;  Location: [facility];  Service: General;  Laterality: N/A;  . TOTAL HIP ARTHROPLASTY     Social History   Occupational History  . not working    Social History Main Topics  . Smoking status: Former Smoker    Quit date: 09/09/1958  . Smokeless tobacco: Never Used  . Alcohol use No  . Drug use: No  . Sexual activity: Not on file

## 2016-05-09 ENCOUNTER — Ambulatory Visit (INDEPENDENT_AMBULATORY_CARE_PROVIDER_SITE_OTHER): Payer: PPO | Admitting: Family

## 2016-05-09 ENCOUNTER — Encounter (INDEPENDENT_AMBULATORY_CARE_PROVIDER_SITE_OTHER): Payer: Self-pay | Admitting: Orthopedic Surgery

## 2016-05-09 VITALS — Ht 62.0 in | Wt 253.0 lb

## 2016-05-09 DIAGNOSIS — M5441 Lumbago with sciatica, right side: Secondary | ICD-10-CM

## 2016-05-09 DIAGNOSIS — I872 Venous insufficiency (chronic) (peripheral): Secondary | ICD-10-CM

## 2016-05-16 ENCOUNTER — Other Ambulatory Visit: Payer: Self-pay | Admitting: Internal Medicine

## 2016-05-16 ENCOUNTER — Other Ambulatory Visit: Payer: Self-pay

## 2016-05-16 MED ORDER — ATORVASTATIN CALCIUM 40 MG PO TABS: 40.0000 mg | ORAL_TABLET | Freq: Every day | ORAL | 0 refills | Status: DC

## 2016-05-16 NOTE — Telephone Encounter (Signed)
atorvastatin (LIPITOR) 40 MG tablet,  Refill request @ [facility] on [address].

## 2016-05-26 ENCOUNTER — Ambulatory Visit (INDEPENDENT_AMBULATORY_CARE_PROVIDER_SITE_OTHER): Payer: PPO | Admitting: Internal Medicine

## 2016-05-26 ENCOUNTER — Encounter: Payer: Self-pay | Admitting: Internal Medicine

## 2016-05-26 ENCOUNTER — Ambulatory Visit (HOSPITAL_COMMUNITY)
Admission: RE | Admit: 2016-05-26 | Discharge: 2016-05-26 | Disposition: A | Discharge status: Home / Self Care | Payer: PPO | Source: Ambulatory Visit | Attending: Oncology | Admitting: Oncology

## 2016-05-26 VITALS — BP 158/68 | HR 88 | Temp 97.8°F | Ht 62.0 in | Wt 254.2 lb

## 2016-05-26 DIAGNOSIS — R600 Localized edema: Secondary | ICD-10-CM

## 2016-05-26 DIAGNOSIS — M25471 Effusion, right ankle: Secondary | ICD-10-CM

## 2016-05-26 DIAGNOSIS — Z87891 Personal history of nicotine dependence: Secondary | ICD-10-CM

## 2016-05-26 DIAGNOSIS — Z853 Personal history of malignant neoplasm of breast: Secondary | ICD-10-CM

## 2016-05-26 DIAGNOSIS — M25571 Pain in right ankle and joints of right foot: Secondary | ICD-10-CM

## 2016-05-26 DIAGNOSIS — M7989 Other specified soft tissue disorders: Secondary | ICD-10-CM | POA: Insufficient documentation

## 2016-05-26 DIAGNOSIS — Z79899 Other long term (current) drug therapy: Secondary | ICD-10-CM | POA: Diagnosis not present

## 2016-05-26 DIAGNOSIS — I10 Essential (primary) hypertension: Secondary | ICD-10-CM | POA: Diagnosis not present

## 2016-05-26 DIAGNOSIS — Z6841 Body Mass Index (BMI) 40.0 and over, adult: Secondary | ICD-10-CM | POA: Diagnosis not present

## 2016-05-26 DIAGNOSIS — I872 Venous insufficiency (chronic) (peripheral): Secondary | ICD-10-CM

## 2016-05-26 NOTE — Progress Notes (Signed)
   CC: R ankle swelling  HPI:  [name] is a 81 y.o. with pmh as listed below is here with complaint of R ankle swelling.   Was seen on 2/13 for same thing. It was thought to be from her chronic venous insufficiency, was recommended to wear compression stocking which she can't wear because of the significant swelling on both legs. She had an area where she had surgery previously on medial malleolus where she noticed some clear drainage recently. The swelling has not improved since last visit and having pain around her right ankle. No hx of dvt, no fevers, no other joint pain. Hx of breast cancer in remission, no other active cancers, no recent long distance travel or immobility.   Past Medical History:  Diagnosis Date  . Allergic rhinitis, cause unspecified   . Asthma   . Cancer ([place])   . Chronic back pain   . GERD (gastroesophageal reflux disease) 05/19/2011  . history of Bilateral leg edema-likely amlodipine induced. 07/28/2011  . history of CAP (community acquired pneumonia) 07/14/2011  . history of HYPOTHYROIDISM, BORDERLINE 03/20/2006   Annotation: Asymptomatic, untreated Qualifier: Diagnosis of  By: Tomasa Hosteller MD, Edmon Crape.   . Hx of breast cancer   . Hyperlipidemia   . Hypertension   . Hypothyroidism   . Lumbago   . OBESITY NOS 03/20/2006   Qualifier: Diagnosis of  By: Tomasa Hosteller MD, [place] 12/13/2005   Annotation: bilateral knees;right knee injection 6/05 Qualifier: Diagnosis of  By: Tomasa Hosteller MD, Veronique D.   . right shoulder pain, likely Impingement syndrome  09/09/2010  . VENOUS INSUFFICIENCY, CHRONIC 03/20/2006   Qualifier: Diagnosis of  By: Tomasa Hosteller MD, Edmon Crape.     Review of Systems:    Review of Systems  Constitutional: Negative for chills and fever.  Respiratory: Negative for cough.   Cardiovascular: Negative for chest pain and palpitations.  Genitourinary: Negative for dysuria.  Musculoskeletal: Positive for joint pain. Negative for  falls, myalgias and neck pain.  Neurological: Negative for dizziness and headaches.     Physical Exam:  Vitals:   05/26/16 0920  BP: (!) 158/68  Pulse: 88  Temp: 97.8 F (36.6 C)  TempSrc: Oral  SpO2: 97%  Weight: 254 lb 3.2 oz (115.3 kg)  Height: 5\' 2"  (1.575 m)   Physical Exam  Constitutional: She is oriented to person, place, and time. She appears well-developed and well-nourished. No distress.  Obese female  HENT:  Head: Normocephalic and atraumatic.  Eyes: Conjunctivae are normal.  Cardiovascular: Normal rate and regular rhythm.  Exam reveals no gallop and no friction rub.   No murmur heard. Respiratory: Effort normal and breath sounds normal.  Musculoskeletal:  Has 2 + pitting edema upto knees on both legs.  There is a area of previous surgery which appears well healed and c/d/I around right medial malleolus. No drainage currently. Has one area of discrete edema on medial aspect of dorsal right foot. No erythema. Has tenderness throughout the ankle joint. No warmth. Has good ROM of the ankle joint with some pain with movement. No calf tenderness.  Neurological: She is alert and oriented to person, place, and time.  Skin: She is not diaphoretic.    Assessment & Plan:   See Encounters Tab for problem based charting.  Patient discussed with Dr. Beryle Beams

## 2016-05-26 NOTE — Patient Instructions (Signed)
We will get xray of your foot and blood work to check your kidney and electrolytes.   I will call you with the results and also may put you on a water pill to help your swelling.  Please try to wear a compression stocking. Avoid wearing tight shoes.

## 2016-05-26 NOTE — Progress Notes (Signed)
Medicine attending: Medical history, presenting problems, physical findings, and medications, reviewed with resident physician Dr Tasrif Ahmed on the day of the patient visit and I concur with his evaluation and management plan. 

## 2016-05-26 NOTE — Assessment & Plan Note (Addendum)
Vitals:   05/26/16 0920  BP: (!) 158/68  Pulse: 88  Temp: 97.8 F (36.6 C)   BP remains elevated on amlodipine 10 mg + coreg + lisinopril. She is having leg edema which is likely contributed by amlodipine but she needs to be on it for BP control. I will add a diuretic to help with the BP and also her leg swelling. First I will check BMET today as she had low K+ last time. Depending on k+, I will send diuretic script as appropriate.   Addendum: K was 3.7, therefore I will send spironolactone 25 mg daily. This can be titrated up in the future as appropriate.

## 2016-05-26 NOTE — Assessment & Plan Note (Addendum)
1+ month of right ankle swelling in the background of 2+ pitting edema on both legs. Has some discrete pockets of fluid around the ankle and foot. No risk for DVT. No recent falls or fractures. She is obese, on amlodipine, and has venous insufficiency which are all contributing to her overall leg edema but unclear whether there is any superimposed ankle pathology to contribute to right ankle pain. No hx of gout, joint does not appear to be inflamed, it appears to be more soft tissue edema.  - will get xray of ankle to evaluate for any joint or bone problems. - asked to comply with wearing compression stocking - may add a diuretic. Can't change amlodipine as her BP is high.   Addendum: xray looked ok without any fracture, showed possible soft tissue edema. K was 3.7, therefore I will send spironolactone 25 mg daily. This can be titrated up in the future as appropriate.

## 2016-05-27 LAB — BASIC METABOLIC PANEL
BUN / CREAT RATIO: 15 (ref 12–28)
BUN: 13 mg/dL (ref 8–27)
CO2: 25 mmol/L (ref 18–29)
Calcium: 9.3 mg/dL (ref 8.7–10.3)
Chloride: 97 mmol/L (ref 96–106)
Creatinine, Ser: 0.85 mg/dL (ref 0.57–1.00)
GFR, EST AFRICAN AMERICAN: 76 mL/min/{1.73_m2} (ref >59)
GFR, EST NON AFRICAN AMERICAN: 66 mL/min/{1.73_m2} (ref >59)
Glucose: 145 mg/dL — ABNORMAL HIGH (ref 65–99)
POTASSIUM: 3.7 mmol/L (ref 3.5–5.2)
SODIUM: 142 mmol/L (ref 134–144)

## 2016-05-27 MED ORDER — SPIRONOLACTONE 25 MG PO TABS: 25.0000 mg | ORAL_TABLET | Freq: Every day | ORAL | 2 refills | Status: DC

## 2016-05-27 NOTE — Addendum Note (Signed)
Addended by: Dellia Nims on: 05/27/2016 11:46 AM   Modules accepted: Orders

## 2016-05-30 ENCOUNTER — Encounter (INDEPENDENT_AMBULATORY_CARE_PROVIDER_SITE_OTHER): Payer: PPO | Admitting: Ophthalmology

## 2016-06-01 ENCOUNTER — Ambulatory Visit (HOSPITAL_COMMUNITY)
Admission: RE | Admit: 2016-06-01 | Discharge: 2016-06-01 | Disposition: A | Discharge status: Home / Self Care | Payer: PPO | Source: Ambulatory Visit | Attending: Family | Admitting: Family

## 2016-06-01 DIAGNOSIS — K8020 Calculus of gallbladder without cholecystitis without obstruction: Secondary | ICD-10-CM | POA: Diagnosis not present

## 2016-06-01 DIAGNOSIS — M47896 Other spondylosis, lumbar region: Secondary | ICD-10-CM | POA: Diagnosis not present

## 2016-06-01 DIAGNOSIS — M5441 Lumbago with sciatica, right side: Secondary | ICD-10-CM | POA: Insufficient documentation

## 2016-06-01 DIAGNOSIS — M48061 Spinal stenosis, lumbar region without neurogenic claudication: Secondary | ICD-10-CM | POA: Diagnosis not present

## 2016-06-01 DIAGNOSIS — M5136 Other intervertebral disc degeneration, lumbar region: Secondary | ICD-10-CM | POA: Insufficient documentation

## 2016-06-01 DIAGNOSIS — M545 Low back pain: Secondary | ICD-10-CM | POA: Diagnosis not present

## 2016-06-02 ENCOUNTER — Other Ambulatory Visit (INDEPENDENT_AMBULATORY_CARE_PROVIDER_SITE_OTHER): Payer: Self-pay | Admitting: Family

## 2016-06-02 DIAGNOSIS — M5416 Radiculopathy, lumbar region: Secondary | ICD-10-CM

## 2016-06-02 NOTE — Progress Notes (Signed)
Will you let know we are sending for esi

## 2016-06-09 ENCOUNTER — Encounter (INDEPENDENT_AMBULATORY_CARE_PROVIDER_SITE_OTHER): Payer: Self-pay | Admitting: Orthopedic Surgery

## 2016-06-09 ENCOUNTER — Ambulatory Visit (INDEPENDENT_AMBULATORY_CARE_PROVIDER_SITE_OTHER): Payer: PPO | Admitting: Orthopedic Surgery

## 2016-06-09 VITALS — Ht 62.0 in | Wt 254.0 lb

## 2016-06-09 DIAGNOSIS — L97919 Non-pressure chronic ulcer of unspecified part of right lower leg with unspecified severity: Secondary | ICD-10-CM

## 2016-06-09 DIAGNOSIS — M5441 Lumbago with sciatica, right side: Secondary | ICD-10-CM | POA: Diagnosis not present

## 2016-06-09 DIAGNOSIS — I87331 Chronic venous hypertension (idiopathic) with ulcer and inflammation of right lower extremity: Secondary | ICD-10-CM | POA: Diagnosis not present

## 2016-06-09 NOTE — Addendum Note (Signed)
Addended by: Pamella Pert on: 06/09/2016 11:13 AM   Modules accepted: Orders

## 2016-06-09 NOTE — Progress Notes (Signed)
Office Visit Note   Patient: [name]           Date of Birth: 10-01-1935           MRN: [id number] Visit Date: 06/09/2016              Requested by: Aldine Contes, MD [place], [address], [place] [address] PCP: Aldine Contes, MD  Chief Complaint  Patient presents with  . Lower Back - Follow-up    MRI review      HPI: Examination patient has an antalgic gait she has a negative straight leg raise bilaterally and no focal motor weakness extremity. Review of her MRI scan shows a anterior listhesis L3 on L4 with some facet arthritis at L2-3 as well as some multilevel degenerative disc bulging. There is no evidence of a significant foraminal stenosis or significant herniated disc. Examination right lower extremity she has brawny skin color changes around the gator area involving the foot and ankle. She is an ulcer over the medial malleolus which is 10 mm in diameter with good healthy tissue no cellulitis the ulcer is 10 mm in diameter and 1 mm deep.  Assessment & Plan: Visit Diagnoses:  1. Right-sided low back pain with right-sided sciatica, unspecified chronicity   2. Idiopathic chronic venous hypertension of right lower extremity with ulcer and inflammation (HCC)     Plan: Patient presents in follow-up for chronic lower back pain status post MRI scan. She states that her neck pain is better when she is sitting. Patient also complains of a new draining ulcer medial malleolus right ankle. She also complains of skin color changes in the right leg.  Follow-Up Instructions: Return in about 1 week (around 06/16/2016).   Ortho Exam  Patient is alert, oriented, no adenopathy, well-dressed, normal affect, normal respiratory effort. Plan to continue conservative therapy for her lumbar spine no indication for intervention at this time. We will place her in a Dynaflex wrap for the right lower extremity follow-up in 1 week.  She will bring her compression stockings with  her and we will reevaluate to see if we can get her into her compression stockings. Anticipate that she will need a 2 layer pair of 30-40 compression stockings at follow-up.  Imaging: No results found.  Labs: Lab Results  Component Value Date   HGBA1C 6.1 03/30/2016   HGBA1C 5.9 09/15/2015   HGBA1C 6.1 09/04/2014   ESRSEDRATE 44 (H) 09/09/2010   ESRSEDRATE 52 (H) 05/22/2008   ESRSEDRATE 73 (H) 03/26/2008   CRP 0.7 (H) 09/09/2010   REPTSTATUS 11/01/2013 FINAL 10/31/2013   CULT NO GROWTH Performed at [facility] 10/31/2013    Orders:  No orders of the defined types were placed in this encounter.  No orders of the defined types were placed in this encounter.    Procedures: No procedures performed  Clinical Data: No additional findings.  ROS:  All other systems negative, except as noted in the HPI. Review of Systems  Objective: Vital Signs: Ht 5\' 2"  (1.575 m)   Wt 254 lb (115.2 kg)   BMI 46.46 kg/m   Specialty Comments:  No specialty comments available.  PMFS History: Patient Active Problem List   Diagnosis Date Noted  . Idiopathic chronic venous hypertension of right lower extremity with ulcer and inflammation ([place]) 06/09/2016  . Right ankle swelling 05/26/2016  . Recurrent dislocation of hip joint prosthesis ([place]) 03/30/2016  . Preventative health care 09/04/2014  . Borderline diabetes 09/19/2013  .  Hypertensive retinopathy 03/29/2013  . Hypokalemia 06/27/2011  . GERD (gastroesophageal reflux disease) 05/19/2011  . Hyperlipidemia 05/02/2007  . ALLERGIC RHINITIS 09/06/2006  . Hypothyroidism 03/20/2006  . Severe obesity (BMI >= 40) ([place]) 03/20/2006  . Chronic venous insufficiency 03/20/2006  . Right-sided low back pain with right-sided sciatica 03/20/2006  . BREAST CANCER, HX OF 03/20/2006  . Essential hypertension 12/13/2005  . Osteoarthritis 12/13/2005   Past Medical History:  Diagnosis Date  . Allergic rhinitis, cause unspecified   .  Asthma   . Cancer ([place])   . Chronic back pain   . GERD (gastroesophageal reflux disease) 05/19/2011  . history of Bilateral leg edema-likely amlodipine induced. 07/28/2011  . history of CAP (community acquired pneumonia) 07/14/2011  . history of HYPOTHYROIDISM, BORDERLINE 03/20/2006   Annotation: Asymptomatic, untreated Qualifier: Diagnosis of  By: Tomasa Hosteller MD, Edmon Crape.   . Hx of breast cancer   . Hyperlipidemia   . Hypertension   . Hypothyroidism   . Lumbago   . OBESITY NOS 03/20/2006   Qualifier: Diagnosis of  By: Tomasa Hosteller MD, [place] 12/13/2005   Annotation: bilateral knees;right knee injection 6/05 Qualifier: Diagnosis of  By: Tomasa Hosteller MD, Veronique D.   . right shoulder pain, likely Impingement syndrome  09/09/2010  . VENOUS INSUFFICIENCY, CHRONIC 03/20/2006   Qualifier: Diagnosis of  By: Tomasa Hosteller MD, Edmon Crape.     Family History  Problem Relation Age of Onset  . Hypertension Mother   . Alzheimer's disease Mother   . Diabetes Sister     Past Surgical History:  Procedure Laterality Date  . ABDOMINAL HYSTERECTOMY    . BREAST LUMPECTOMY    . HERNIA REPAIR    . HIP CLOSED REDUCTION Right 11/26/2014   Procedure: CLOSED REDUCTIION RIGHT HIP;  Surgeon: Newt Minion, MD;  Location: [facility];  Service: Orthopedics;  Laterality: Right;  . INCISIONAL HERNIA REPAIR N/A 10/29/2013   Procedure: OPEN REPAIR OF RECURRENT INCISIONAL HERNIA WITH MESH;  Surgeon: Zenovia Jarred, MD;  Location: [facility];  Service: General;  Laterality: N/A;  . TOTAL HIP ARTHROPLASTY     Social History   Occupational History  . not working    Social History Main Topics  . Smoking status: Former Smoker    Quit date: 09/09/1958  . Smokeless tobacco: Never Used  . Alcohol use No  . Drug use: No  . Sexual activity: Not on file

## 2016-06-10 ENCOUNTER — Telehealth (INDEPENDENT_AMBULATORY_CARE_PROVIDER_SITE_OTHER): Payer: Self-pay

## 2016-06-10 ENCOUNTER — Other Ambulatory Visit (INDEPENDENT_AMBULATORY_CARE_PROVIDER_SITE_OTHER): Payer: Self-pay | Admitting: Family

## 2016-06-10 DIAGNOSIS — I872 Venous insufficiency (chronic) (peripheral): Secondary | ICD-10-CM

## 2016-06-10 MED ORDER — ACETAMINOPHEN-CODEINE #3 300-30 MG PO TABS: 1.0000 | ORAL_TABLET | Freq: Four times a day (QID) | ORAL | 0 refills | Status: DC | PRN

## 2016-06-10 NOTE — Telephone Encounter (Signed)
Would like a RF on pain meds. She states she was seen yesterday and forgot to ask for a refill.   CB  [phone]

## 2016-06-10 NOTE — Telephone Encounter (Signed)
rx called into pharm and called pt to advise that this has been done. To call with questions.

## 2016-06-10 NOTE — Telephone Encounter (Signed)
Will you call

## 2016-06-15 ENCOUNTER — Ambulatory Visit (INDEPENDENT_AMBULATORY_CARE_PROVIDER_SITE_OTHER): Payer: PPO | Admitting: Family

## 2016-06-16 ENCOUNTER — Encounter (INDEPENDENT_AMBULATORY_CARE_PROVIDER_SITE_OTHER): Payer: Self-pay | Admitting: Orthopedic Surgery

## 2016-06-16 ENCOUNTER — Ambulatory Visit (INDEPENDENT_AMBULATORY_CARE_PROVIDER_SITE_OTHER): Payer: PPO | Admitting: Orthopedic Surgery

## 2016-06-16 DIAGNOSIS — I872 Venous insufficiency (chronic) (peripheral): Secondary | ICD-10-CM

## 2016-06-16 MED ORDER — ACETAMINOPHEN-CODEINE #3 300-30 MG PO TABS: 1.0000 | ORAL_TABLET | Freq: Three times a day (TID) | ORAL | 0 refills | Status: DC | PRN

## 2016-06-16 NOTE — Progress Notes (Signed)
Office Visit Note   Patient: [name]           Date of Birth: 1935-07-29           MRN: [id number] Visit Date: 06/16/2016              Requested by: Aldine Contes, MD [place], [place], [place] [address] PCP: Aldine Contes, MD  Chief Complaint  Patient presents with  . Right Leg - Follow-up      HPI: The patient is a 81 year old woman seen today in follow up for venous insufficiency swelling with ulceration to RLE. This is improved. Has been in Dynaflex wrap to RLE for last week.  Complains of continued right medial ankle pain. This is constant.  Assessment & Plan: Visit Diagnoses:  1. Chronic venous insufficiency     Plan: Recommended she get in to some compression stockings bilaterally. Have advised [facility] for stockings. Follow up in office in 4 weeks for re eval.   Follow-Up Instructions: Return in about 4 weeks (around 07/14/2016).   Ortho Exam  Patient is alert, oriented, no adenopathy, well-dressed, normal affect, normal respiratory effort. Examination right lower extremity she has brawny skin color changes around the gator area involving the foot and ankle. The ulcer over the medial malleolus is well healed. No drainage. No surrounding erythema. Does have good wrinkling of skin on right, decreased swelling. LLE with 1+ pitting edema. No weeping to erythema.    Imaging: No results found.  Labs: Lab Results  Component Value Date   HGBA1C 6.1 03/30/2016   HGBA1C 5.9 09/15/2015   HGBA1C 6.1 09/04/2014   ESRSEDRATE 44 (H) 09/09/2010   ESRSEDRATE 52 (H) 05/22/2008   ESRSEDRATE 73 (H) 03/26/2008   CRP 0.7 (H) 09/09/2010   REPTSTATUS 11/01/2013 FINAL 10/31/2013   CULT NO GROWTH Performed at [facility] 10/31/2013    Orders:  No orders of the defined types were placed in this encounter.  Meds ordered this encounter  Medications  . acetaminophen-codeine (TYLENOL #3) 300-30 MG tablet      Sig: Take 1 tablet by mouth every 8 (eight) hours as needed for moderate pain.    Dispense:  30 tablet    Refill:  0     Procedures: No procedures performed  Clinical Data: No additional findings.  ROS:  All other systems negative, except as noted in the HPI. Review of Systems  Constitutional: Negative for chills and fever.  Cardiovascular: Positive for leg swelling.  Skin: Negative for color change and wound.    Objective: Vital Signs: There were no vitals taken for this visit.  Specialty Comments:  No specialty comments available.  PMFS History: Patient Active Problem List   Diagnosis Date Noted  . Idiopathic chronic venous hypertension of right lower extremity with ulcer and inflammation ([place]) 06/09/2016  . Right ankle swelling 05/26/2016  . Recurrent dislocation of hip joint prosthesis ([place]) 03/30/2016  . Preventative health care 09/04/2014  . Borderline diabetes 09/19/2013  . Hypertensive retinopathy 03/29/2013  . Hypokalemia 06/27/2011  . GERD (gastroesophageal reflux disease) 05/19/2011  . Hyperlipidemia 05/02/2007  . ALLERGIC RHINITIS 09/06/2006  . Hypothyroidism 03/20/2006  . Severe obesity (BMI >= 40) ([place]) 03/20/2006  . Chronic venous insufficiency 03/20/2006  . Right-sided low back pain with right-sided sciatica 03/20/2006  . BREAST CANCER, HX OF 03/20/2006  . Essential hypertension 12/13/2005  . Osteoarthritis 12/13/2005   Past Medical History:  Diagnosis Date  .  Allergic rhinitis, cause unspecified   . Asthma   . Cancer ([place])   . Chronic back pain   . GERD (gastroesophageal reflux disease) 05/19/2011  . history of Bilateral leg edema-likely amlodipine induced. 07/28/2011  . history of CAP (community acquired pneumonia) 07/14/2011  . history of HYPOTHYROIDISM, BORDERLINE 03/20/2006   Annotation: Asymptomatic, untreated Qualifier: Diagnosis of  By: Tomasa Hosteller MD, Edmon Crape.   . Hx of breast cancer   . Hyperlipidemia   . Hypertension   .  Hypothyroidism   . Lumbago   . OBESITY NOS 03/20/2006   Qualifier: Diagnosis of  By: Tomasa Hosteller MD, [place] 12/13/2005   Annotation: bilateral knees;right knee injection 6/05 Qualifier: Diagnosis of  By: Tomasa Hosteller MD, Veronique D.   . right shoulder pain, likely Impingement syndrome  09/09/2010  . VENOUS INSUFFICIENCY, CHRONIC 03/20/2006   Qualifier: Diagnosis of  By: Tomasa Hosteller MD, Edmon Crape.     Family History  Problem Relation Age of Onset  . Hypertension Mother   . Alzheimer's disease Mother   . Diabetes Sister     Past Surgical History:  Procedure Laterality Date  . ABDOMINAL HYSTERECTOMY    . BREAST LUMPECTOMY    . HERNIA REPAIR    . HIP CLOSED REDUCTION Right 11/26/2014   Procedure: CLOSED REDUCTIION RIGHT HIP;  Surgeon: Newt Minion, MD;  Location: [facility];  Service: Orthopedics;  Laterality: Right;  . INCISIONAL HERNIA REPAIR N/A 10/29/2013   Procedure: OPEN REPAIR OF RECURRENT INCISIONAL HERNIA WITH MESH;  Surgeon: Zenovia Jarred, MD;  Location: [facility];  Service: General;  Laterality: N/A;  . TOTAL HIP ARTHROPLASTY     Social History   Occupational History  . not working    Social History Main Topics  . Smoking status: Former Smoker    Quit date: 09/09/1958  . Smokeless tobacco: Never Used  . Alcohol use No  . Drug use: No  . Sexual activity: Not on file

## 2016-06-20 ENCOUNTER — Encounter (INDEPENDENT_AMBULATORY_CARE_PROVIDER_SITE_OTHER): Payer: PPO | Admitting: Ophthalmology

## 2016-06-29 ENCOUNTER — Encounter (INDEPENDENT_AMBULATORY_CARE_PROVIDER_SITE_OTHER): Payer: Self-pay | Admitting: Physical Medicine and Rehabilitation

## 2016-06-29 ENCOUNTER — Ambulatory Visit (INDEPENDENT_AMBULATORY_CARE_PROVIDER_SITE_OTHER): Payer: PPO

## 2016-06-29 ENCOUNTER — Ambulatory Visit (INDEPENDENT_AMBULATORY_CARE_PROVIDER_SITE_OTHER): Payer: PPO | Admitting: Physical Medicine and Rehabilitation

## 2016-06-29 VITALS — BP 148/84 | HR 81 | Temp 98.0°F

## 2016-06-29 DIAGNOSIS — M5416 Radiculopathy, lumbar region: Secondary | ICD-10-CM | POA: Diagnosis not present

## 2016-06-29 MED ORDER — IIOPAMIDOL (ISOVUE-250) INJECTION 51%
3.0000 mL | Freq: Once | INTRAVENOUS | Status: AC
  Administered 2016-06-29: 3 mL
  Filled 2016-06-29: qty 50

## 2016-06-29 MED ORDER — LIDOCAINE HCL (PF) 1 % IJ SOLN
2.0000 mL | Freq: Once | INTRAMUSCULAR | Status: AC
  Administered 2016-06-29: 2 mL

## 2016-06-29 MED ORDER — METHYLPREDNISOLONE ACETATE 80 MG/ML IJ SUSP
80.0000 mg | Freq: Once | INTRAMUSCULAR | Status: AC
  Administered 2016-06-29: 80 mg

## 2016-06-29 NOTE — Procedures (Signed)
Lumbosacral Transforaminal Epidural Steroid Injection - Infraneural Approach with Fluoroscopic Guidance  Patient: [name]      Date of Birth: [birth year] MRN: [id number] PCP: Aldine Contes, MD      Visit Date: 06/29/2016   Universal Protocol:    Date/Time: 04/25/182:03 PM  Consent Given By: the patient  Position: PRONE   Additional Comments: Vital signs were monitored before and after the procedure. Patient was prepped and draped in the usual sterile fashion. The correct patient, procedure, and site was verified.   Injection Procedure Details:  Procedure Site One Meds Administered:  Meds ordered this encounter  Medications  . lidocaine (PF) (XYLOCAINE) 1 % injection 2 mL  . iopamidol (ISOVUE-250) 51 % injection 3 mL  . methylPREDNISolone acetate (DEPO-MEDROL) injection 80 mg      Laterality: Right  Location/Site:  L3-L4  Needle size: 22 G  Needle type: Spinal  Needle Placement: Transforaminal  Findings:  -Contrast Used: 1 mL iohexol 180 mg iodine/mL   -Comments: Excellent flow of contrast along the nerve and into the epidural space.  Procedure Details: After squaring off the end-plates of the desired vertebral level to get a true AP view, the C-arm was obliqued to the painful side so that the superior articulating process is positioned about 1/3 the length of the inferior endplate.  The needle was aimed toward the junction of the superior articular process and the transverse process of the inferior vertebrae. The needle's initial entry is in the lower third of the foramen through Kambin's triangle. The soft tissues overlying this target were infiltrated with 2-3 ml. of 1% Lidocaine without Epinephrine.  The spinal needle was then inserted and advanced toward the target using a "trajectory" view along the fluoroscope beam.  Under AP and lateral visualization, the needle was advanced so it did not puncture dura and did not traverse medially beyond the 6 o'clock  position of the pedicle. Bi-planar projections were used to confirm position. Aspiration was confirmed to be negative for CSF and/or blood. A 1-2 ml. volume of Isovue-250 was injected and flow of contrast was noted at each level. Radiographs were obtained for documentation purposes.   After attaining the desired flow of contrast documented above, a 0.5 to 1.0 ml test dose of 0.25% Marcaine was injected into each respective transforaminal space.  The patient was observed for 90 seconds post injection.  After no sensory deficits were reported, and normal lower extremity motor function was noted,   the above injectate was administered so that equal amounts of the injectate were placed at each foramen (level) into the transforaminal epidural space.   Additional Comments:  The patient tolerated the procedure well Dressing: Band-Aid    Post-procedure details: Patient was observed during the procedure. Post-procedure instructions were reviewed.  Patient left the clinic in stable condition.

## 2016-06-29 NOTE — Patient Instructions (Signed)

## 2016-06-29 NOTE — Progress Notes (Signed)
[name] - 81 y.o. female MRN [id number]  Date of birth: [birth year]  Office Visit Note: Visit Date: 06/29/2016 PCP: Aldine Contes, MD Referred by: Aldine Contes, MD  Subjective: Chief Complaint  Patient presents with  . Lower Back - Pain   HPI: [name] is a very pleasant 81 year old female with chronic long-term recalcitrant back pain but worse over the last several months. She's been followed by  Dondra Prader, FN-P an MRI of the lumbar spine is reviewed below. She has mostly right-sided symptoms that radiates to about the knee. She has a listhesis of L3 on L4.  A complete a right L3 transforaminal epidural steroid injection. Depending on the relief would look at facet joint block at probably 2 levels on that side as this could be facet mediated referral pain.    ROS Otherwise per HPI.  Assessment & Plan: Visit Diagnoses:  1. Lumbar radiculopathy     Plan: Findings:  Diagnostic and therapeutic right L3 transforaminal epidural steroid injection with fluoroscopic guidance.    Meds & Orders:  Meds ordered this encounter  Medications  . lidocaine (PF) (XYLOCAINE) 1 % injection 2 mL  . iopamidol (ISOVUE-250) 51 % injection 3 mL  . methylPREDNISolone acetate (DEPO-MEDROL) injection 80 mg    Orders Placed This Encounter  Procedures  . XR C-ARM NO REPORT  . Epidural Steroid injection    Follow-up: Return if symptoms worsen or fail to improve, for Dondra Prader, FN-P.   Procedures: No procedures performed  Lumbosacral Transforaminal Epidural Steroid Injection - Infraneural Approach with Fluoroscopic Guidance  Patient: [name]      Date of Birth: 26-Feb-1936 MRN: [id number] PCP: Aldine Contes, MD      Visit Date: 06/29/2016   Universal Protocol:    Date/Time: 04/25/182:03 PM  Consent Given By: the patient  Position: PRONE   Additional Comments: Vital signs were monitored before and after the procedure. Patient was prepped and draped in the usual  sterile fashion. The correct patient, procedure, and site was verified.   Injection Procedure Details:  Procedure Site One Meds Administered:  Meds ordered this encounter  Medications  . lidocaine (PF) (XYLOCAINE) 1 % injection 2 mL  . iopamidol (ISOVUE-250) 51 % injection 3 mL  . methylPREDNISolone acetate (DEPO-MEDROL) injection 80 mg      Laterality: Right  Location/Site:  L3-L4  Needle size: 22 G  Needle type: Spinal  Needle Placement: Transforaminal  Findings:  -Contrast Used: 1 mL iohexol 180 mg iodine/mL   -Comments: Excellent flow of contrast along the nerve and into the epidural space.  Procedure Details: After squaring off the end-plates of the desired vertebral level to get a true AP view, the C-arm was obliqued to the painful side so that the superior articulating process is positioned about 1/3 the length of the inferior endplate.  The needle was aimed toward the junction of the superior articular process and the transverse process of the inferior vertebrae. The needle's initial entry is in the lower third of the foramen through Kambin's triangle. The soft tissues overlying this target were infiltrated with 2-3 ml. of 1% Lidocaine without Epinephrine.  The spinal needle was then inserted and advanced toward the target using a "trajectory" view along the fluoroscope beam.  Under AP and lateral visualization, the needle was advanced so it did not puncture dura and did not traverse medially beyond the 6 o'clock position of the pedicle. Bi-planar projections were used to confirm position. Aspiration was confirmed to be  negative for CSF and/or blood. A 1-2 ml. volume of Isovue-250 was injected and flow of contrast was noted at each level. Radiographs were obtained for documentation purposes.   After attaining the desired flow of contrast documented above, a 0.5 to 1.0 ml test dose of 0.25% Marcaine was injected into each respective transforaminal space.  The patient was  observed for 90 seconds post injection.  After no sensory deficits were reported, and normal lower extremity motor function was noted,   the above injectate was administered so that equal amounts of the injectate were placed at each foramen (level) into the transforaminal epidural space.   Additional Comments:  The patient tolerated the procedure well Dressing: Band-Aid    Post-procedure details: Patient was observed during the procedure. Post-procedure instructions were reviewed.  Patient left the clinic in stable condition.   Clinical History: IMPRESSION: 1. Trace anterolisthesis of L3 on L4 with associated moderate facet arthrosis and mild disc bulge, resulting in moderate canal and bilateral lateral recess stenosis. 2. Left greater than right facet arthrosis at L2-3 with resultant mild canal and lateral recess stenosis, with mild left foraminal narrowing. 3. Additional mild multilevel degenerative disc bulging as above. No other significant stenosis or evidence for neural impingement. 4. Multilevel facet arthrosis involving the lumbar spine as above, greatest at L3-4. 5. Cholelithiasis.   Electronically Signed   By: Jeannine Boga M.D.   On: 06/01/2016 21:06  She reports that she quit smoking about 57 years ago. She has never used smokeless tobacco.   Recent Labs  09/15/15 1017 03/30/16 1142  HGBA1C 5.9 6.1    Objective:  VS:  HT:    WT:   BMI:     BP:(!) 148/84  HR:81bpm  TEMP:98 F (36.7 C)(Oral)  RESP:96 % Physical Exam  Musculoskeletal:  Pain with standing and extension. No pain with hip rotation good distal strength.    Ortho Exam Imaging: Xr C-arm No Report  Result Date: 06/29/2016 Please see Notes or Procedures tab for imaging impression.   Past Medical/Family/Surgical/Social History: Medications & Allergies reviewed per EMR Patient Active Problem List   Diagnosis Date Noted  . Idiopathic chronic venous hypertension of right lower  extremity with ulcer and inflammation ([place]) 06/09/2016  . Right ankle swelling 05/26/2016  . Recurrent dislocation of hip joint prosthesis ([place]) 03/30/2016  . Preventative health care 09/04/2014  . Borderline diabetes 09/19/2013  . Hypertensive retinopathy 03/29/2013  . Hypokalemia 06/27/2011  . GERD (gastroesophageal reflux disease) 05/19/2011  . Hyperlipidemia 05/02/2007  . ALLERGIC RHINITIS 09/06/2006  . Hypothyroidism 03/20/2006  . Severe obesity (BMI >= 40) ([place]) 03/20/2006  . Chronic venous insufficiency 03/20/2006  . Right-sided low back pain with right-sided sciatica 03/20/2006  . BREAST CANCER, HX OF 03/20/2006  . Essential hypertension 12/13/2005  . Osteoarthritis 12/13/2005   Past Medical History:  Diagnosis Date  . Allergic rhinitis, cause unspecified   . Asthma   . Cancer ([place])   . Chronic back pain   . GERD (gastroesophageal reflux disease) 05/19/2011  . history of Bilateral leg edema-likely amlodipine induced. 07/28/2011  . history of CAP (community acquired pneumonia) 07/14/2011  . history of HYPOTHYROIDISM, BORDERLINE 03/20/2006   Annotation: Asymptomatic, untreated Qualifier: Diagnosis of  By: Tomasa Hosteller MD, Edmon Crape.   . Hx of breast cancer   . Hyperlipidemia   . Hypertension   . Hypothyroidism   . Lumbago   . OBESITY NOS 03/20/2006   Qualifier: Diagnosis of  By: Tomasa Hosteller MD, Edmon Crape.   Marland Kitchen  OSTEOARTHRITIS 12/13/2005   Annotation: bilateral knees;right knee injection 6/05 Qualifier: Diagnosis of  By: Tomasa Hosteller MD, Veronique D.   . right shoulder pain, likely Impingement syndrome  09/09/2010  . VENOUS INSUFFICIENCY, CHRONIC 03/20/2006   Qualifier: Diagnosis of  By: Tomasa Hosteller MD, Edmon Crape.    Family History  Problem Relation Age of Onset  . Hypertension Mother   . Alzheimer's disease Mother   . Diabetes Sister    Past Surgical History:  Procedure Laterality Date  . ABDOMINAL HYSTERECTOMY    . BREAST LUMPECTOMY    . HERNIA REPAIR    . HIP CLOSED REDUCTION  Right 11/26/2014   Procedure: CLOSED REDUCTIION RIGHT HIP;  Surgeon: Newt Minion, MD;  Location: [facility];  Service: Orthopedics;  Laterality: Right;  . INCISIONAL HERNIA REPAIR N/A 10/29/2013   Procedure: OPEN REPAIR OF RECURRENT INCISIONAL HERNIA WITH MESH;  Surgeon: Zenovia Jarred, MD;  Location: [facility];  Service: General;  Laterality: N/A;  . TOTAL HIP ARTHROPLASTY     Social History   Occupational History  . not working    Social History Main Topics  . Smoking status: Former Smoker    Quit date: 09/09/1958  . Smokeless tobacco: Never Used  . Alcohol use No  . Drug use: No  . Sexual activity: Not on file

## 2016-06-29 NOTE — Progress Notes (Deleted)
Right side lower back pain for several years and worse recently. Pain with walking and standing. No pain with sitting. Radiates down leg to knee at times.

## 2016-06-30 ENCOUNTER — Telehealth (INDEPENDENT_AMBULATORY_CARE_PROVIDER_SITE_OTHER): Payer: Self-pay | Admitting: Physical Medicine and Rehabilitation

## 2016-06-30 NOTE — Telephone Encounter (Signed)
Called patient to advise. She will call us back tomorrow to let us know how she is doing or before if symptoms worsen or if she is concerned.

## 2016-06-30 NOTE — Telephone Encounter (Signed)
She should use tylenol 2 caps TID and plenty of fluids. If allowed caffienated drinks like TEA etc that may help should resolve in day or so. Likely related to steroid medication.  Transforaminal esi is almost impossible to get dural puncture and flow of contrast looked ok. Tell her to call tomorrow to see how she is doing.

## 2016-07-01 NOTE — Telephone Encounter (Signed)
I called the patient to see how she is feeling. She said she is doing much better. Still a slight pain, but she said her headache is nothing like it was.

## 2016-07-05 ENCOUNTER — Other Ambulatory Visit (INDEPENDENT_AMBULATORY_CARE_PROVIDER_SITE_OTHER): Payer: Self-pay | Admitting: Family

## 2016-07-05 ENCOUNTER — Ambulatory Visit (INDEPENDENT_AMBULATORY_CARE_PROVIDER_SITE_OTHER): Payer: PPO | Admitting: Internal Medicine

## 2016-07-05 ENCOUNTER — Telehealth: Payer: Self-pay

## 2016-07-05 VITALS — BP 151/69 | HR 75 | Temp 98.1°F | Ht 62.0 in | Wt 246.4 lb

## 2016-07-05 DIAGNOSIS — I10 Essential (primary) hypertension: Secondary | ICD-10-CM | POA: Diagnosis not present

## 2016-07-05 DIAGNOSIS — K219 Gastro-esophageal reflux disease without esophagitis: Secondary | ICD-10-CM | POA: Diagnosis not present

## 2016-07-05 DIAGNOSIS — I872 Venous insufficiency (chronic) (peripheral): Secondary | ICD-10-CM | POA: Diagnosis not present

## 2016-07-05 DIAGNOSIS — M5441 Lumbago with sciatica, right side: Secondary | ICD-10-CM | POA: Diagnosis not present

## 2016-07-05 DIAGNOSIS — Z87891 Personal history of nicotine dependence: Secondary | ICD-10-CM

## 2016-07-05 DIAGNOSIS — M25471 Effusion, right ankle: Secondary | ICD-10-CM

## 2016-07-05 DIAGNOSIS — R7303 Prediabetes: Secondary | ICD-10-CM | POA: Diagnosis not present

## 2016-07-05 MED ORDER — SPIRONOLACTONE 50 MG PO TABS: 50.0000 mg | ORAL_TABLET | Freq: Every day | ORAL | 0 refills | Status: DC

## 2016-07-05 NOTE — Assessment & Plan Note (Signed)
-   Patient's symptoms are well-controlled on pantoprazole 40 mg - We'll continue with this for now. We'll consider a trial off of Protonix on her next visit to see if she still requires this medication

## 2016-07-05 NOTE — Telephone Encounter (Signed)
Dr Dareen Piano, this was last filled by ortho and they also rec'd a request today, they have not filled but it has been sent to their provider for approval

## 2016-07-05 NOTE — Progress Notes (Signed)
   Subjective:    Patient ID: [name], female    DOB: [birth year], 81 y.o.   MRN: [id number]  HPI  I have seen and examined this patient. She is here for routine follow-up of her hypertension and borderline diabetes.  Patient complains of persistent right ankle pain and swelling for which she has been following up with orthopedics. She denies any other complaints at this time.  Review of Systems  Constitutional: Negative.   HENT: Negative.   Respiratory: Negative.  Negative for shortness of breath.   Cardiovascular: Positive for leg swelling. Negative for chest pain and palpitations.  Gastrointestinal: Negative.   Musculoskeletal: Positive for arthralgias and back pain.  Neurological: Negative.   Psychiatric/Behavioral: Negative.        Objective:   Physical Exam  Constitutional: She is oriented to person, place, and time. She appears well-developed and well-nourished.  HENT:  Head: Normocephalic and atraumatic.  Mouth/Throat: No oropharyngeal exudate.  Neck: Neck supple.  Cardiovascular: Normal rate, regular rhythm and normal heart sounds.   Pulmonary/Chest: Effort normal and breath sounds normal. No respiratory distress. She has no wheezes.  Abdominal: Soft. Bowel sounds are normal. She exhibits no distension.  Musculoskeletal: She exhibits edema.  Right lower extremity 2+ pitting edema noted. No tenderness on exam and no increased local warmth  Lymphadenopathy:    She has no cervical adenopathy.  Neurological: She is alert and oriented to person, place, and time.  Skin: Skin is warm. No rash noted. No erythema.  Psychiatric: She has a normal mood and affect. Her behavior is normal.          Assessment & Plan:  Please see problem-based charting for assessment and plan

## 2016-07-05 NOTE — Assessment & Plan Note (Signed)
-   Patient with persistent pain and swelling over her right ankle - She has been evaluated in orthopedics for this and it has been attributed to chronic venous insufficiency - Given her elevated blood pressures we'll increase her spironolactone to 50 mg and this may help with her swelling as well - She is encouraged to continue to use her compression stockings and to elevate her leg  - Would consider stopping her amlodipine on her follow-up visit if this swelling continues even though this is unlikely to be the cause of her unilateral swelling

## 2016-07-05 NOTE — Assessment & Plan Note (Signed)
BP Readings from Last 3 Encounters:  07/05/16 (!) 151/69  06/29/16 (!) 148/84  05/26/16 (!) 158/68    Lab Results  Component Value Date   NA 142 05/26/2016   K 3.7 05/26/2016   CREATININE 0.85 05/26/2016    Assessment: Blood pressure control:  fair Progress toward BP goal:   same Comments: Patient with persistent blood pressures above goal  Plan: Medications:  We'll increase spironolactone to 50 mg and continue with amlodipine 10 mg as well as carvedilol 6.25 mg twice a day Educational resources provided: brochure (denies need ) Self management tools provided:   Other plans: If patient continues to have right lower extremity swelling despite leg elevation and compression stocking use as well as increased dose of spironolactone may consider stopping her amlodipine and starting an alternative agent

## 2016-07-05 NOTE — Assessment & Plan Note (Signed)
-   Patient's A1 C's have been well controlled off of any medication - We'll recheck her A1c every 6 months for now and then changed to yearly if these remain well-controlled

## 2016-07-05 NOTE — Assessment & Plan Note (Signed)
-   Patient followed up with orthopedics for her back pain and received a transforaminal epidural steroid injection at L3-L4 - She states her back pain has improved since this injection but still complains of some mild pain - She we'll follow-up with orthopedics as an outpatient for further injections if required

## 2016-07-05 NOTE — Patient Instructions (Signed)
-   It was a pleasure seeing you today - I have increased your spirinolactone to 50 mg to help control your blood pressure -  If the swelling worsens I will consider stopping your amlodipine - We will do a foot exam today - Please follow up in 2 weeks for repeat BP and bloodwork

## 2016-07-05 NOTE — Telephone Encounter (Signed)
Thank you Bonnita Nasuti. If they do not fill it let me know and I will fill it

## 2016-07-05 NOTE — Telephone Encounter (Signed)
  acetaminophen-codeine (TYLENOL #3) 300-30 MG tablet, REFILL REQUEST

## 2016-07-06 ENCOUNTER — Other Ambulatory Visit: Payer: Self-pay | Admitting: *Deleted

## 2016-07-06 DIAGNOSIS — I872 Venous insufficiency (chronic) (peripheral): Secondary | ICD-10-CM

## 2016-07-06 MED ORDER — ACETAMINOPHEN-CODEINE #3 300-30 MG PO TABS: 1.0000 | ORAL_TABLET | Freq: Three times a day (TID) | ORAL | 0 refills | Status: DC | PRN

## 2016-07-06 NOTE — Telephone Encounter (Signed)
Faxed ty#3 to pharmacy

## 2016-07-07 ENCOUNTER — Ambulatory Visit (INDEPENDENT_AMBULATORY_CARE_PROVIDER_SITE_OTHER): Payer: PPO | Admitting: Orthopedic Surgery

## 2016-07-10 ENCOUNTER — Other Ambulatory Visit: Payer: Self-pay | Admitting: Internal Medicine

## 2016-07-10 DIAGNOSIS — I10 Essential (primary) hypertension: Secondary | ICD-10-CM

## 2016-07-11 NOTE — Telephone Encounter (Signed)
Requesting to speak with a nurse about meds. Please call pt back.  

## 2016-07-11 NOTE — Telephone Encounter (Signed)
Will refill. She likely needs a BMP to recheck renal function and potassium as she is on Spironolactone and Lisinopril,  I see that she has an appointment in 1 week where this could be done.

## 2016-07-12 DIAGNOSIS — Z961 Presence of intraocular lens: Secondary | ICD-10-CM | POA: Diagnosis not present

## 2016-07-12 DIAGNOSIS — H25811 Combined forms of age-related cataract, right eye: Secondary | ICD-10-CM | POA: Diagnosis not present

## 2016-07-12 DIAGNOSIS — H348322 Tributary (branch) retinal vein occlusion, left eye, stable: Secondary | ICD-10-CM | POA: Diagnosis not present

## 2016-07-15 ENCOUNTER — Ambulatory Visit: Payer: PPO

## 2016-07-18 ENCOUNTER — Encounter (INDEPENDENT_AMBULATORY_CARE_PROVIDER_SITE_OTHER): Payer: PPO | Admitting: Ophthalmology

## 2016-07-18 DIAGNOSIS — H35033 Hypertensive retinopathy, bilateral: Secondary | ICD-10-CM | POA: Diagnosis not present

## 2016-07-18 DIAGNOSIS — H43813 Vitreous degeneration, bilateral: Secondary | ICD-10-CM

## 2016-07-18 DIAGNOSIS — H34832 Tributary (branch) retinal vein occlusion, left eye, with macular edema: Secondary | ICD-10-CM | POA: Diagnosis not present

## 2016-07-18 DIAGNOSIS — I10 Essential (primary) hypertension: Secondary | ICD-10-CM

## 2016-07-18 DIAGNOSIS — H2511 Age-related nuclear cataract, right eye: Secondary | ICD-10-CM | POA: Diagnosis not present

## 2016-07-19 ENCOUNTER — Ambulatory Visit (INDEPENDENT_AMBULATORY_CARE_PROVIDER_SITE_OTHER): Payer: PPO | Admitting: Internal Medicine

## 2016-07-19 ENCOUNTER — Encounter: Payer: Self-pay | Admitting: Internal Medicine

## 2016-07-19 ENCOUNTER — Other Ambulatory Visit: Payer: Self-pay | Admitting: *Deleted

## 2016-07-19 VITALS — BP 139/74 | HR 79 | Temp 98.3°F | Ht 62.0 in | Wt 252.8 lb

## 2016-07-19 DIAGNOSIS — I10 Essential (primary) hypertension: Secondary | ICD-10-CM

## 2016-07-19 DIAGNOSIS — E669 Obesity, unspecified: Secondary | ICD-10-CM | POA: Diagnosis not present

## 2016-07-19 DIAGNOSIS — Z79899 Other long term (current) drug therapy: Secondary | ICD-10-CM | POA: Diagnosis not present

## 2016-07-19 DIAGNOSIS — M25471 Effusion, right ankle: Secondary | ICD-10-CM

## 2016-07-19 DIAGNOSIS — Z87891 Personal history of nicotine dependence: Secondary | ICD-10-CM | POA: Diagnosis not present

## 2016-07-19 DIAGNOSIS — I872 Venous insufficiency (chronic) (peripheral): Secondary | ICD-10-CM | POA: Diagnosis not present

## 2016-07-19 LAB — BASIC METABOLIC PANEL
Anion gap: 7 (ref 5–15)
BUN: 12 mg/dL (ref 6–20)
CALCIUM: 8.9 mg/dL (ref 8.9–10.3)
CO2: 27 mmol/L (ref 22–32)
Chloride: 104 mmol/L (ref 101–111)
Creatinine, Ser: 0.86 mg/dL (ref 0.44–1.00)
GFR calc Af Amer: >60 mL/min (ref >60)
GFR calc non Af Amer: >60 mL/min (ref >60)
GLUCOSE: 108 mg/dL — AB (ref 65–99)
Potassium: 3.7 mmol/L (ref 3.5–5.1)
Sodium: 138 mmol/L (ref 135–145)

## 2016-07-19 MED ORDER — SPIRONOLACTONE 50 MG PO TABS: 75.0000 mg | ORAL_TABLET | Freq: Every day | ORAL | 2 refills | Status: DC

## 2016-07-19 MED ORDER — AMLODIPINE BESYLATE 5 MG PO TABS: 5.0000 mg | ORAL_TABLET | Freq: Every day | ORAL | 2 refills | Status: DC

## 2016-07-19 NOTE — Progress Notes (Signed)
   CC: f/up for leg swelling  HPI:  [name] is a 81 y.o. with PMh as listed below is here for f/up of her leg swelling.  Past Medical History:  Diagnosis Date  . Allergic rhinitis, cause unspecified   . Asthma   . Cancer ([place])   . Chronic back pain   . GERD (gastroesophageal reflux disease) 05/19/2011  . history of Bilateral leg edema-likely amlodipine induced. 07/28/2011  . history of CAP (community acquired pneumonia) 07/14/2011  . history of HYPOTHYROIDISM, BORDERLINE 03/20/2006   Annotation: Asymptomatic, untreated Qualifier: Diagnosis of  By: Tomasa Hosteller MD, Edmon Crape.   . Hx of breast cancer   . Hyperlipidemia   . Hypertension   . Hypothyroidism   . Lumbago   . OBESITY NOS 03/20/2006   Qualifier: Diagnosis of  By: Tomasa Hosteller MD, [place] 12/13/2005   Annotation: bilateral knees;right knee injection 6/05 Qualifier: Diagnosis of  By: Tomasa Hosteller MD, Veronique D.   . right shoulder pain, likely Impingement syndrome  09/09/2010  . VENOUS INSUFFICIENCY, CHRONIC 03/20/2006   Qualifier: Diagnosis of  By: Tomasa Hosteller MD, Edmon Crape.    Has leg swelling, thought to be from chronic venous insufficiency, her diuretic spironolactone was increased to 50mg  daily last visit with plan of stopping amlodipine if swelling continued to be a problem.  On spironolactone 50mg  daily + amlodipine 10 mg + coreg 6.25mg  BID + lisinopril 40mg  for her BP. Today BP is improved to 139/74.   Her swelling has improved per patient since last visit. No complaints.   Review of Systems:   Review of Systems  Constitutional: Negative for chills and fever.  Respiratory: Negative for cough and hemoptysis.   Cardiovascular: Positive for leg swelling. Negative for chest pain and palpitations.  Neurological: Negative for dizziness.     Physical Exam:  Vitals:   07/19/16 1024  BP: 139/74  Pulse: 79  Temp: 98.3 F (36.8 C)  TempSrc: Oral  SpO2: 97%  Weight: 252 lb 12.8 oz (114.7 kg)    Height: 5\' 2"  (1.575 m)   Physical Exam  Constitutional: She is oriented to person, place, and time. She appears well-developed and well-nourished. No distress.  Obese female  HENT:  Head: Normocephalic and atraumatic.  Eyes: Conjunctivae are normal.  Cardiovascular: Normal rate and regular rhythm.  Exam reveals no gallop and no friction rub.   No murmur heard. Respiratory: Effort normal and breath sounds normal.  Musculoskeletal:  Has 2 + pitting edema upto the shins.   Neurological: She is alert and oriented to person, place, and time.  Skin: She is not diaphoretic.    Assessment & Plan:   See Encounters Tab for problem based charting.  Patient discussed with Dr. Daryll Drown

## 2016-07-19 NOTE — Assessment & Plan Note (Addendum)
Vitals:   07/19/16 1024  BP: 139/74  Pulse: 79  Temp: 98.3 F (36.8 C)   BP is better today after increasing spironolactone to 50mg  last time. Also on amlodipine 10 mg , lisinopril 40mg  and coreg 6.25 mg bid.  Checked BMET, K 3.7.  Since she is having leg edema, will be increasing spironolactone to 75mg  and decreasing amlodipine to 5 mg. Continue other meds the same.   F/up in 1 month.

## 2016-07-19 NOTE — Patient Instructions (Addendum)
I will get lab work today and call you with the results and will let you know what to do with the medications.  I am considering cutting down the amlodipine to 5 mg daily and going up on the spironolactone to 75mg   Follow up in 1 month.

## 2016-07-19 NOTE — Assessment & Plan Note (Addendum)
Leg swelling improved after increasing spironolactone to 50mg  daily. She is on amlodipine 10mg  daily which may be contributing to the leg edema.  Will increase spironolactone to 75mg  daily, and go down on amlodipine to 5mg  daily.

## 2016-07-20 NOTE — Progress Notes (Signed)
Internal Medicine Clinic Attending  Case discussed with Dr. Ahmed soon after the resident saw the patient.  We reviewed the resident's history and exam and pertinent patient test results.  I agree with the assessment, diagnosis, and plan of care documented in the resident's note. 

## 2016-08-10 ENCOUNTER — Other Ambulatory Visit: Payer: Self-pay | Admitting: Internal Medicine

## 2016-08-10 DIAGNOSIS — I10 Essential (primary) hypertension: Secondary | ICD-10-CM

## 2016-08-10 MED ORDER — AMLODIPINE BESYLATE 5 MG PO TABS: 5.0000 mg | ORAL_TABLET | Freq: Every day | ORAL | 1 refills | Status: DC

## 2016-08-10 MED ORDER — ATORVASTATIN CALCIUM 40 MG PO TABS: 40.0000 mg | ORAL_TABLET | Freq: Every day | ORAL | 1 refills | Status: DC

## 2016-08-10 NOTE — Telephone Encounter (Signed)
Refill request  atorvastatin (LIPITOR) 40 MG tablet  amLODipine (NORVASC) 5 MG tablet

## 2016-08-16 ENCOUNTER — Ambulatory Visit (INDEPENDENT_AMBULATORY_CARE_PROVIDER_SITE_OTHER): Payer: PPO | Admitting: Internal Medicine

## 2016-08-16 DIAGNOSIS — I10 Essential (primary) hypertension: Secondary | ICD-10-CM | POA: Diagnosis not present

## 2016-08-16 DIAGNOSIS — Z6841 Body Mass Index (BMI) 40.0 and over, adult: Secondary | ICD-10-CM

## 2016-08-16 DIAGNOSIS — Z87891 Personal history of nicotine dependence: Secondary | ICD-10-CM | POA: Diagnosis not present

## 2016-08-16 DIAGNOSIS — K219 Gastro-esophageal reflux disease without esophagitis: Secondary | ICD-10-CM

## 2016-08-16 DIAGNOSIS — Z79899 Other long term (current) drug therapy: Secondary | ICD-10-CM | POA: Diagnosis not present

## 2016-08-16 DIAGNOSIS — I872 Venous insufficiency (chronic) (peripheral): Secondary | ICD-10-CM | POA: Diagnosis not present

## 2016-08-16 MED ORDER — PANTOPRAZOLE SODIUM 40 MG PO TBEC: 40.0000 mg | DELAYED_RELEASE_TABLET | Freq: Every day | ORAL | 2 refills | Status: DC

## 2016-08-16 MED ORDER — AMLODIPINE BESYLATE 10 MG PO TABS: 10.0000 mg | ORAL_TABLET | Freq: Every day | ORAL | 1 refills | Status: DC

## 2016-08-16 MED ORDER — LISINOPRIL 40 MG PO TABS: 40.0000 mg | ORAL_TABLET | Freq: Every day | ORAL | 0 refills | Status: DC

## 2016-08-16 NOTE — Patient Instructions (Addendum)
Thank you for visiting clinic today. Congratulations on losing 4 pounds over one month, please keep up the good work and keep exercising and watching your diet. You can improve your overall well-being and blood pressure just by losing weight too. Continue taking amlodipine 10 mg daily, as your blood pressure is well maintained on that. As your leg swelling is improving, continue using compression stockings during the day and keep your feet elevated while resting. Follow-up in about 6 weeks.

## 2016-08-16 NOTE — Progress Notes (Signed)
   CC: For follow-up of her blood pressure.  HPI:  [name] is a 81 y.o. with past medical history as listed below came to the clinic for follow-up of her blood pressure and lower extremity edema.  She has no complaints today. She is trying to lose some weight by doing some exercises at home and watching her diet, she did lost 4 pounds since her previous visit 1 month ago.  During previous visit her amlodipine was decreased from 10 mg to 5 mg, as it might be contributory to her lower extremity swelling, she is still taking 10 mg daily. Her lower extremity swelling is improving with the use of compression stocking and increase in spironolactone. I will continue the same regimen with amlodipine 10 mg as she is normotensive on recheck. She will need a repeat BMP on next follow-up visit.  Past Medical History:  Diagnosis Date  . Allergic rhinitis, cause unspecified   . Asthma   . Cancer ([place])   . Chronic back pain   . GERD (gastroesophageal reflux disease) 05/19/2011  . history of Bilateral leg edema-likely amlodipine induced. 07/28/2011  . history of CAP (community acquired pneumonia) 07/14/2011  . history of HYPOTHYROIDISM, BORDERLINE 03/20/2006   Annotation: Asymptomatic, untreated Qualifier: Diagnosis of  By: Tomasa Hosteller MD, Edmon Crape.   . Hx of breast cancer   . Hyperlipidemia   . Hypertension   . Hypothyroidism   . Lumbago   . OBESITY NOS 03/20/2006   Qualifier: Diagnosis of  By: Tomasa Hosteller MD, [place] 12/13/2005   Annotation: bilateral knees;right knee injection 6/05 Qualifier: Diagnosis of  By: Tomasa Hosteller MD, Veronique D.   . right shoulder pain, likely Impingement syndrome  09/09/2010  . VENOUS INSUFFICIENCY, CHRONIC 03/20/2006   Qualifier: Diagnosis of  By: Tomasa Hosteller MD, Edmon Crape.     Review of Systems:  AS per HPI.  Physical Exam:  Vitals:   08/16/16 0921  BP: (!) 166/83  Pulse: 78  Temp: 97.8 F (36.6 C)  TempSrc: Oral  SpO2: 100%  Weight:  248 lb 12.8 oz (112.9 kg)  Height: 5\' 2"  (1.575 m)    General: Vital signs reviewed.  Patient is well-developed and well-nourished, in no acute distress and cooperative with exam.  Cardiovascular: RRR, S1 normal, S2 normal, no murmurs, gallops, or rubs. Pulmonary/Chest: Clear to auscultation bilaterally, no wheezes, rales, or rhonchi. Abdominal: Soft, non-tender, non-distended, BS +, no masses, organomegaly, or guarding present.  Extremities: 1+ pitting lower extremity edema bilaterally up to mid legs, with some erythema,  pulses symmetric and intact bilaterally. Psychiatric: Normal mood and affect. speech and behavior is normal. Cognition and memory are normal.  Assessment & Plan:   See Encounters Tab for problem based charting.  Patient discussed with Dr. Daryll Drown.

## 2016-08-16 NOTE — Assessment & Plan Note (Signed)
Patient states that she is trying to lose weight by some exercising at home and watching her diet.  She did lost 4 pounds since her previous office visit 1 month ago.  I congratulated her for her successful efforts, and advised her to keep up the good work. She might need some more adjustment in her blood pressure medications as she continued to decrease weight.

## 2016-08-16 NOTE — Assessment & Plan Note (Signed)
Her symptoms are well controlled on Protonix.  She was asking for refill.  Refill was provided.

## 2016-08-16 NOTE — Assessment & Plan Note (Addendum)
BP Readings from Last 3 Encounters:  08/16/16 (!) 166/83  07/19/16 139/74  07/05/16 (!) 151/69   Her blood pressure was elevated on initial check, on recheck it was 128/77-normotensive.  Her amlodipine was decreased to 5 mg daily during previous office visit because of her lower extremity  Edema. Patient is still taking 10 mg daily, her lower extremity swelling is improving with increase in spironolactone and compression stockings.  I gave her a refill on amlodipine 10 mg daily, lisinopril 40 mg daily. She can continue Spiriva left on 75 mg daily and Coreg 6.25 mg twice daily. She will need a BMP during next follow-up visit. Follow-up in 6 weeks.

## 2016-08-16 NOTE — Assessment & Plan Note (Signed)
Most likely the cause of her lower extremity swelling. She does has mild erythema associated with 1+ pitting edema today. She was not wearing her compression stockings today, stating that she was in a hurry this morning and forget to put it on. She also states that compression stockings helped with her lower  extremity swelling.  -I advised her to regularly use compression stockings during the day while ambulating, she was also advised to keep her legs elevated while resting.

## 2016-08-17 NOTE — Progress Notes (Signed)
Internal Medicine Clinic Attending  Case discussed with Dr. Reesa Chew at the time of the visit.  We reviewed the resident's history and exam and pertinent patient test results.  I agree with the assessment, diagnosis, and plan of care documented in the resident's note.  Please note under hypertension issue spiriva should read spironolactone.

## 2016-08-19 ENCOUNTER — Telehealth (INDEPENDENT_AMBULATORY_CARE_PROVIDER_SITE_OTHER): Payer: Self-pay | Admitting: Orthopedic Surgery

## 2016-08-19 NOTE — Telephone Encounter (Signed)
Patient called needing Rx refilled (Tylenol 3). Patient is she can not sleep because of the pain in her right hip and leg. The number to contact patient is [phone]

## 2016-08-19 NOTE — Telephone Encounter (Signed)
I called and spoke with patient advised that refill inappropriate. Advised that we are more than happy to see her. She will call us to make appointment after figuring transportation.

## 2016-08-22 ENCOUNTER — Telehealth: Payer: Self-pay

## 2016-08-22 DIAGNOSIS — I10 Essential (primary) hypertension: Secondary | ICD-10-CM

## 2016-08-22 NOTE — Telephone Encounter (Addendum)
Fax from pharmacy spironolactone 50 mg is on backorder please change order to 25 MG 3 tabs QD

## 2016-08-22 NOTE — Telephone Encounter (Signed)
Would you pls ask the pharmacist what we are to substitute for spironolactone? Eplerenone is the only sub I know and it is costly and I imagine needs pre-auth. Would you also pls ask how long this backorder is to last so we may plan as we have many pts on spironolactone. Do other pharmacies have spirolactone?

## 2016-08-23 MED ORDER — SPIRONOLACTONE 25 MG PO TABS: 75.0000 mg | ORAL_TABLET | Freq: Every day | ORAL | 1 refills | Status: DC

## 2016-08-23 NOTE — Telephone Encounter (Signed)
I spoke to the pharmacist at [facility] and he was able to fill 25 mg spironolactone (take 3 tablets by mouth daily), took a verbal prescription for the patient.  PCP appointment 10/11/16

## 2016-08-31 ENCOUNTER — Encounter (INDEPENDENT_AMBULATORY_CARE_PROVIDER_SITE_OTHER): Payer: PPO | Admitting: Ophthalmology

## 2016-09-09 ENCOUNTER — Encounter (INDEPENDENT_AMBULATORY_CARE_PROVIDER_SITE_OTHER): Payer: PPO | Admitting: Ophthalmology

## 2016-09-09 DIAGNOSIS — H43813 Vitreous degeneration, bilateral: Secondary | ICD-10-CM | POA: Diagnosis not present

## 2016-09-09 DIAGNOSIS — I10 Essential (primary) hypertension: Secondary | ICD-10-CM | POA: Diagnosis not present

## 2016-09-09 DIAGNOSIS — H34832 Tributary (branch) retinal vein occlusion, left eye, with macular edema: Secondary | ICD-10-CM | POA: Diagnosis not present

## 2016-09-09 DIAGNOSIS — H35033 Hypertensive retinopathy, bilateral: Secondary | ICD-10-CM | POA: Diagnosis not present

## 2016-10-11 ENCOUNTER — Ambulatory Visit: Payer: PPO | Admitting: Internal Medicine

## 2016-11-04 ENCOUNTER — Other Ambulatory Visit: Payer: Self-pay | Admitting: *Deleted

## 2016-11-04 DIAGNOSIS — I872 Venous insufficiency (chronic) (peripheral): Secondary | ICD-10-CM

## 2016-11-04 MED ORDER — ACETAMINOPHEN-CODEINE #3 300-30 MG PO TABS: 1.0000 | ORAL_TABLET | Freq: Three times a day (TID) | ORAL | 0 refills | Status: DC | PRN

## 2016-11-04 NOTE — Telephone Encounter (Signed)
Call from pt - states having back/hip pain and needs refill on Tylenol #3 which states she does not take very often; only when needed. "I don't want to get hook on them".

## 2016-11-04 NOTE — Telephone Encounter (Signed)
Tylenol #3 rx faxed to [place] - pt was called/informed also.

## 2016-11-08 ENCOUNTER — Encounter (INDEPENDENT_AMBULATORY_CARE_PROVIDER_SITE_OTHER): Payer: PPO | Admitting: Ophthalmology

## 2016-11-08 ENCOUNTER — Ambulatory Visit: Payer: PPO | Admitting: Internal Medicine

## 2016-11-08 DIAGNOSIS — H43813 Vitreous degeneration, bilateral: Secondary | ICD-10-CM | POA: Diagnosis not present

## 2016-11-08 DIAGNOSIS — I10 Essential (primary) hypertension: Secondary | ICD-10-CM | POA: Diagnosis not present

## 2016-11-08 DIAGNOSIS — H35033 Hypertensive retinopathy, bilateral: Secondary | ICD-10-CM | POA: Diagnosis not present

## 2016-11-08 DIAGNOSIS — H34832 Tributary (branch) retinal vein occlusion, left eye, with macular edema: Secondary | ICD-10-CM

## 2016-11-22 ENCOUNTER — Encounter: Payer: Self-pay | Admitting: Internal Medicine

## 2016-11-22 ENCOUNTER — Ambulatory Visit (INDEPENDENT_AMBULATORY_CARE_PROVIDER_SITE_OTHER): Payer: PPO | Admitting: Internal Medicine

## 2016-11-22 VITALS — BP 179/73 | HR 77 | Temp 98.2°F | Ht 62.0 in | Wt 254.9 lb

## 2016-11-22 DIAGNOSIS — E785 Hyperlipidemia, unspecified: Secondary | ICD-10-CM | POA: Diagnosis not present

## 2016-11-22 DIAGNOSIS — R011 Cardiac murmur, unspecified: Secondary | ICD-10-CM

## 2016-11-22 DIAGNOSIS — I10 Essential (primary) hypertension: Secondary | ICD-10-CM | POA: Diagnosis not present

## 2016-11-22 DIAGNOSIS — E039 Hypothyroidism, unspecified: Secondary | ICD-10-CM

## 2016-11-22 DIAGNOSIS — R062 Wheezing: Secondary | ICD-10-CM | POA: Diagnosis not present

## 2016-11-22 DIAGNOSIS — R7303 Prediabetes: Secondary | ICD-10-CM | POA: Diagnosis not present

## 2016-11-22 DIAGNOSIS — Z0000 Encounter for general adult medical examination without abnormal findings: Secondary | ICD-10-CM

## 2016-11-22 DIAGNOSIS — I872 Venous insufficiency (chronic) (peripheral): Secondary | ICD-10-CM

## 2016-11-22 DIAGNOSIS — Z23 Encounter for immunization: Secondary | ICD-10-CM | POA: Diagnosis not present

## 2016-11-22 DIAGNOSIS — J984 Other disorders of lung: Secondary | ICD-10-CM | POA: Insufficient documentation

## 2016-11-22 HISTORY — DX: Cardiac murmur, unspecified: R01.1

## 2016-11-22 LAB — POCT GLYCOSYLATED HEMOGLOBIN (HGB A1C): Hemoglobin A1C: 5.8

## 2016-11-22 LAB — GLUCOSE, CAPILLARY: GLUCOSE-CAPILLARY: 113 mg/dL — AB (ref 65–99)

## 2016-11-22 MED ORDER — SPIRONOLACTONE 100 MG PO TABS: 100.0000 mg | ORAL_TABLET | Freq: Every day | ORAL | 0 refills | Status: DC

## 2016-11-22 MED ORDER — ACETAMINOPHEN-CODEINE #3 300-30 MG PO TABS: 1.0000 | ORAL_TABLET | Freq: Three times a day (TID) | ORAL | 0 refills | Status: DC | PRN

## 2016-11-22 MED ORDER — CARVEDILOL 12.5 MG PO TABS: 12.5000 mg | ORAL_TABLET | Freq: Two times a day (BID) | ORAL | 0 refills | Status: DC

## 2016-11-22 MED ORDER — ALBUTEROL SULFATE HFA 108 (90 BASE) MCG/ACT IN AERS: 2.0000 | INHALATION_SPRAY | RESPIRATORY_TRACT | 1 refills | Status: DC | PRN

## 2016-11-22 NOTE — Patient Instructions (Signed)
-   It was a pleasure seeing you today - Please follow up in 1 month for blood work and repeat BP check - I have stopped your amlodipine - I have increased your sprinolactone to 100 mg and increased your carvedilol to 12.5 mg twice a day - We will check some blood work today  - Please call me with any questions

## 2016-11-23 ENCOUNTER — Telehealth: Payer: Self-pay | Admitting: Internal Medicine

## 2016-11-23 LAB — LIPID PANEL
CHOLESTEROL TOTAL: 126 mg/dL (ref 100–199)
Chol/HDL Ratio: 2.3 ratio (ref 0.0–4.4)
HDL: 54 mg/dL (ref >39)
LDL Calculated: 59 mg/dL (ref 0–99)
Triglycerides: 63 mg/dL (ref 0–149)
VLDL CHOLESTEROL CAL: 13 mg/dL (ref 5–40)

## 2016-11-23 LAB — BMP8+ANION GAP
Anion Gap: 17 mmol/L (ref 10.0–18.0)
BUN/Creatinine Ratio: 19 (ref 12–28)
BUN: 16 mg/dL (ref 8–27)
CALCIUM: 9.5 mg/dL (ref 8.7–10.3)
CO2: 24 mmol/L (ref 20–29)
Chloride: 102 mmol/L (ref 96–106)
Creatinine, Ser: 0.84 mg/dL (ref 0.57–1.00)
GFR, EST AFRICAN AMERICAN: 77 mL/min/{1.73_m2} (ref >59)
GFR, EST NON AFRICAN AMERICAN: 67 mL/min/{1.73_m2} (ref >59)
Glucose: 92 mg/dL (ref 65–99)
Potassium: 4 mmol/L (ref 3.5–5.2)
Sodium: 143 mmol/L (ref 134–144)

## 2016-11-23 LAB — TSH: TSH: 4.06 u[IU]/mL (ref 0.450–4.500)

## 2016-11-23 LAB — T4, FREE: FREE T4: 1.07 ng/dL (ref 0.82–1.77)

## 2016-11-23 NOTE — Telephone Encounter (Signed)
I called the patient to discuss the results of her blood work. I explained that her thyroid function and renal function are wnl and her cholesterol panel looked good as well. I also instructed her to call us within a week if she has not been scheduled for her ECHO. She expresses understanding and is in agreement with plan.

## 2016-11-23 NOTE — Assessment & Plan Note (Signed)
-   Patient is compliant with Lipitor - We'll check lipid panel

## 2016-11-23 NOTE — Progress Notes (Signed)
   Subjective:    Patient ID: [name], female    DOB: March 31, 1935, 81 y.o.   MRN: [id number]  HPI  I have seen and examined this patient. Patient is here for routine follow-up of her hypertension and borderline diabetes.  Patient denies any new complaints at this time but states that her hip pain has been acting up since the weather changed. She also complained of persistent lower extremity swelling despite being compliant with the compression stockings and spironolactone. Patient feels well otherwise.    Review of Systems  Constitutional: Negative.   HENT: Negative.   Respiratory: Negative.   Cardiovascular: Positive for leg swelling.  Gastrointestinal: Negative.   Musculoskeletal: Positive for arthralgias. Negative for back pain and joint swelling.  Skin: Negative.   Neurological: Negative.   Psychiatric/Behavioral: Negative.        Objective:   Physical Exam  Constitutional: She is oriented to person, place, and time. She appears well-developed and well-nourished.  HENT:  Head: Normocephalic and atraumatic.  Mouth/Throat: No oropharyngeal exudate.  Neck: Neck supple.  Cardiovascular: Normal rate and regular rhythm.   Murmur heard. Patient noted to have a systolic ejection murmur on exam  Pulmonary/Chest: Effort normal and breath sounds normal. No respiratory distress. She has no wheezes.  Abdominal: Soft. Bowel sounds are normal. She exhibits no distension. There is no tenderness.  Musculoskeletal: Normal range of motion.  1+ bilateral lower extremity pitting edema noted  Lymphadenopathy:    She has no cervical adenopathy.  Neurological: She is alert and oriented to person, place, and time.  Skin: Skin is warm. No rash noted. No erythema.  Psychiatric: She has a normal mood and affect. Her behavior is normal.          Assessment & Plan:  Please see problem based charting for assessment and plan:

## 2016-11-23 NOTE — Assessment & Plan Note (Signed)
-   Patient complains of persistent bilateral lower extremity edema despite compliance with her compression stockings and spironolactone - I suspect that in addition to venous insufficiency and some of this edema secondary to her amlodipine - I will stop her amlodipine for now and advised patient to continue with her compression stockings

## 2016-11-23 NOTE — Assessment & Plan Note (Signed)
-   Patient with likely subclinical hypothyroidism based on previous lab work with a mildly elevated TSH and normal free T4 - She remained asymptomatic off medications - I will recheck her TSH and free T4

## 2016-11-23 NOTE — Assessment & Plan Note (Signed)
-   Patient has a history of wheezing especially on exertion and during cold weather - She has been on an albuterol inhaler for these symptoms - She has never had PFTs - We will obtain PFTs on her follow-up visit to better clarify the etiology of her symptoms and refill her albuterol inhaler for now

## 2016-11-23 NOTE — Assessment & Plan Note (Addendum)
BP Readings from Last 3 Encounters:  11/22/16 (!) 179/73  08/16/16 (!) 166/83  07/19/16 139/74    Lab Results  Component Value Date   NA 143 11/22/2016   K 4.0 11/22/2016   CREATININE 0.84 11/22/2016    Assessment: Blood pressure control:  uncontrolled Progress toward BP goal:   deteriorated Comments: Patient states she is compliant with her lisinopril 40 mg, carvedilol 6.25 mg twice a day, amlodipine 10 mg and spironolactone 75 mg  Plan: Medications:  We'll will stop her amlodipine for now given her lower extremity edema and increase her carvedilol to 12.5 mg twice a day and her spironolactone to 100 mg Educational resources provided: brochure (denies need ) Self management tools provided:   Other plans: We'll check BMP today. If her blood pressure remains elevated I will consider stopping her spironolactone and starting her on triamterene/HCTZ. Patient has had a hyperaldosterone workup in the past which was negative

## 2016-11-23 NOTE — Assessment & Plan Note (Signed)
-   Patient's A1c has been well controlled off of all medication - Repeat A1c today was 5.8 - Would check her A1c's yearly from now

## 2016-11-23 NOTE — Assessment & Plan Note (Signed)
Flu shot given today

## 2016-11-23 NOTE — Assessment & Plan Note (Signed)
-   Patient noted to have an ejection systolic murmur on exam today - She denies any palpitations, shortness of breath, chest pain, lightheadedness, syncope - We'll obtain a 2-D echo to further evaluate this murmur

## 2016-12-19 ENCOUNTER — Ambulatory Visit (HOSPITAL_COMMUNITY): Admission: RE | Admit: 2016-12-19 | Payer: PPO | Source: Ambulatory Visit

## 2016-12-27 ENCOUNTER — Ambulatory Visit (INDEPENDENT_AMBULATORY_CARE_PROVIDER_SITE_OTHER): Payer: PPO | Admitting: Internal Medicine

## 2016-12-27 ENCOUNTER — Encounter: Payer: Self-pay | Admitting: Internal Medicine

## 2016-12-27 VITALS — BP 139/66 | HR 70 | Temp 97.9°F | Ht 62.0 in | Wt 253.1 lb

## 2016-12-27 DIAGNOSIS — R011 Cardiac murmur, unspecified: Secondary | ICD-10-CM | POA: Diagnosis not present

## 2016-12-27 DIAGNOSIS — I10 Essential (primary) hypertension: Secondary | ICD-10-CM | POA: Diagnosis not present

## 2016-12-27 DIAGNOSIS — I872 Venous insufficiency (chronic) (peripheral): Secondary | ICD-10-CM

## 2016-12-27 DIAGNOSIS — G8929 Other chronic pain: Secondary | ICD-10-CM | POA: Diagnosis not present

## 2016-12-27 DIAGNOSIS — M5441 Lumbago with sciatica, right side: Secondary | ICD-10-CM | POA: Diagnosis not present

## 2016-12-27 DIAGNOSIS — Z87891 Personal history of nicotine dependence: Secondary | ICD-10-CM

## 2016-12-27 DIAGNOSIS — Z79899 Other long term (current) drug therapy: Secondary | ICD-10-CM | POA: Diagnosis not present

## 2016-12-27 DIAGNOSIS — R0602 Shortness of breath: Secondary | ICD-10-CM

## 2016-12-27 DIAGNOSIS — R2243 Localized swelling, mass and lump, lower limb, bilateral: Secondary | ICD-10-CM | POA: Diagnosis not present

## 2016-12-27 DIAGNOSIS — R062 Wheezing: Secondary | ICD-10-CM

## 2016-12-27 MED ORDER — ALBUTEROL SULFATE HFA 108 (90 BASE) MCG/ACT IN AERS: 2.0000 | INHALATION_SPRAY | RESPIRATORY_TRACT | 1 refills | Status: DC | PRN

## 2016-12-27 MED ORDER — DICLOFENAC SODIUM 1 % TD GEL: 2.0000 g | TRANSDERMAL | 3 refills | Status: DC | PRN

## 2016-12-27 NOTE — Patient Instructions (Signed)
-   It was a pleasure seeing you today - Please follow up in 3 months - Continue with your current medications for your BP- carvedilol 12.5 mg twice a day and lisinopril 40 mg once a day - I will put you in for lung function tests for your wheezing - I will attempt to get the ultrasound of your heart

## 2016-12-28 NOTE — Assessment & Plan Note (Signed)
-  Patient still complains of intermittent wheezing and shortness of breath with some exertion -States her symptoms are well controlled with albuterol as needed -Patient is never had pulmonary function test done -PFTs ordered for patient today -Further workup will depend on the results of PFTs

## 2016-12-28 NOTE — Assessment & Plan Note (Signed)
-  Patient is noted to have a systolic murmur on her last visit and is scheduled to have a 2D echo -Patient required a prior authorization for this which has not been done yet -Will attempt to get this scheduled for patient to follow-up on her murmur

## 2016-12-28 NOTE — Progress Notes (Signed)
   Subjective:    Patient ID: [name], female    DOB: 02/23/1936, 81 y.o.   MRN: [id number]  HPI I seen and examined this patient.  Patient is here for routine follow-up of her hypertension.  Patient states that her lower extremity swelling has resolved since coming off her amlodipine.  She is accidentally been taking carvedilol 6.25 mg twice daily along with her newer dose of carvedilol 12.5 mg twice daily.  I explained to her that she was increased from her prior dose to the 12.5 mg and that she should stop taking the 6.25 mg dose.  Patient states that she does have intermittent wheezing but has not been scheduled for pulmonary function tests yet.   Review of Systems  Constitutional: Negative.   HENT: Negative.   Respiratory: Negative.   Cardiovascular: Negative.   Gastrointestinal: Negative.   Musculoskeletal: Negative.   Skin: Negative.   Neurological: Negative.   Psychiatric/Behavioral: Negative.        Objective:   Physical Exam  Constitutional: She is oriented to person, place, and time. She appears well-developed and well-nourished.  HENT:  Head: Normocephalic and atraumatic.  Mouth/Throat: No oropharyngeal exudate.  Neck: Neck supple.  Cardiovascular: Normal rate and regular rhythm.   Murmur heard. Systolic ejection murmur 3 out of 6  Pulmonary/Chest: Effort normal and breath sounds normal. No respiratory distress. She has no wheezes.  Abdominal: Soft. Bowel sounds are normal. She exhibits no distension. There is no tenderness.  Musculoskeletal: Normal range of motion.  Trace bilateral lower extremity edema  Lymphadenopathy:    She has no cervical adenopathy.  Neurological: She is alert and oriented to person, place, and time.  Skin: Skin is warm. No rash noted. No erythema.  Psychiatric: She has a normal mood and affect. Her behavior is normal.          Assessment & Plan:  Please see problem based charting for assessment and plan:

## 2016-12-28 NOTE — Assessment & Plan Note (Signed)
BP Readings from Last 3 Encounters:  12/27/16 139/66  11/22/16 (!) 179/73  08/16/16 (!) 166/83    Lab Results  Component Value Date   NA 143 11/22/2016   K 4.0 11/22/2016   CREATININE 0.84 11/22/2016    Assessment: Blood pressure control:  Well-controlled Progress toward BP goal:   At goal Comments: Patient is compliant with carvedilol 12.5 mg twice daily and lisinopril 40 mg.  She is also accidentally been taking her carvedilol 6.25 mg twice daily as well.  Plan: Medications:  Continue with carvedilol 12.5 mg twice daily and lisinopril 40 mg.  Discontinue carvedilol 6.25 mg  Educational resources provided: brochure (denies need ) Self management tools provided:   Other plans: Patient educated about the importance of weight loss and encouraged to follow a low-salt diet and exercise

## 2016-12-28 NOTE — Assessment & Plan Note (Signed)
-  Patient has chronic bilateral lower extremity swelling -I suspect that some of this is secondary to chronic venous insufficiency but she was also on amlodipine which was contributing to her lower extremity edema -She was taken off her amlodipine at her last visit and her lower extremity edema is now much improved -She only had trace bilateral lower extremity edema on exam today -Patient encouraged to continue with bilateral leg grafting for her chronic venous insufficiency

## 2016-12-28 NOTE — Assessment & Plan Note (Addendum)
-  This problem is chronic and stable -We will refill Voltaren gel for this -Patient states that her pain is well controlled with this medication -No further workup for now

## 2017-01-03 ENCOUNTER — Encounter (INDEPENDENT_AMBULATORY_CARE_PROVIDER_SITE_OTHER): Payer: PPO | Admitting: Ophthalmology

## 2017-01-04 ENCOUNTER — Ambulatory Visit (HOSPITAL_COMMUNITY)
Admission: RE | Admit: 2017-01-04 | Discharge: 2017-01-04 | Disposition: A | Discharge status: Home / Self Care | Payer: PPO | Source: Ambulatory Visit | Attending: Internal Medicine | Admitting: Internal Medicine

## 2017-01-04 DIAGNOSIS — R0602 Shortness of breath: Secondary | ICD-10-CM | POA: Insufficient documentation

## 2017-01-04 DIAGNOSIS — R062 Wheezing: Secondary | ICD-10-CM

## 2017-01-04 LAB — PULMONARY FUNCTION TEST
DL/VA % pred: 92 %
DL/VA: 4.21 ml/min/mmHg/L
DLCO unc % pred: 47 %
DLCO unc: 10.3 ml/min/mmHg
FEF 25-75 PRE: 0.95 L/s
FEF 25-75 Post: 1.65 L/sec
FEF2575-%Change-Post: 72 %
FEF2575-%PRED-PRE: 78 %
FEF2575-%Pred-Post: 134 %
FEV1-%CHANGE-POST: 14 %
FEV1-%PRED-POST: 79 %
FEV1-%Pred-Pre: 69 %
FEV1-POST: 1.13 L
FEV1-PRE: 0.99 L
FEV1FVC-%Change-Post: 7 %
FEV1FVC-%Pred-Pre: 106 %
FEV6-%CHANGE-POST: 8 %
FEV6-%Pred-Post: 74 %
FEV6-%Pred-Pre: 68 %
FEV6-POST: 1.31 L
FEV6-Pre: 1.21 L
FEV6FVC-%Change-Post: 0 %
FEV6FVC-%PRED-POST: 105 %
FEV6FVC-%Pred-Pre: 104 %
FVC-%Change-Post: 7 %
FVC-%Pred-Post: 70 %
FVC-%Pred-Pre: 66 %
FVC-POST: 1.31 L
FVC-PRE: 1.22 L
POST FEV6/FVC RATIO: 100 %
PRE FEV1/FVC RATIO: 81 %
Post FEV1/FVC ratio: 87 %
Pre FEV6/FVC Ratio: 99 %
RV % PRED: 55 %
RV: 1.26 L
TLC % PRED: 61 %
TLC: 2.93 L

## 2017-01-04 MED ORDER — ALBUTEROL SULFATE (2.5 MG/3ML) 0.083% IN NEBU
2.5000 mg | INHALATION_SOLUTION | Freq: Once | RESPIRATORY_TRACT | Status: AC
  Administered 2017-01-04: 2.5 mg via RESPIRATORY_TRACT

## 2017-01-06 ENCOUNTER — Other Ambulatory Visit: Payer: Self-pay | Admitting: Internal Medicine

## 2017-01-06 DIAGNOSIS — I10 Essential (primary) hypertension: Secondary | ICD-10-CM

## 2017-01-11 ENCOUNTER — Other Ambulatory Visit: Payer: Self-pay | Admitting: Internal Medicine

## 2017-01-11 DIAGNOSIS — I10 Essential (primary) hypertension: Secondary | ICD-10-CM

## 2017-01-11 MED ORDER — CARVEDILOL 12.5 MG PO TABS: 12.5000 mg | ORAL_TABLET | Freq: Two times a day (BID) | ORAL | 1 refills | Status: DC

## 2017-01-11 NOTE — Telephone Encounter (Signed)
Patient needs a refill on carvedilol 12.5mg , patient is taking last pill today.

## 2017-01-12 ENCOUNTER — Telehealth: Payer: Self-pay | Admitting: Internal Medicine

## 2017-01-12 DIAGNOSIS — R942 Abnormal results of pulmonary function studies: Secondary | ICD-10-CM

## 2017-01-12 NOTE — Telephone Encounter (Signed)
I called patient to discuss the results of her PFTs. She appears to have a restrictive defect as evidenced by her low FEV1 and FVC with a normal ratio and a low DLCO. I explained to the patient that her lung volumes are lower than expected and I would like to refer her to pulmonology for further evaluation. She expresses understanding and is in agreement with the plan.

## 2017-01-16 ENCOUNTER — Ambulatory Visit (INDEPENDENT_AMBULATORY_CARE_PROVIDER_SITE_OTHER): Payer: PPO | Admitting: Internal Medicine

## 2017-01-16 ENCOUNTER — Telehealth: Payer: Self-pay | Admitting: Internal Medicine

## 2017-01-16 ENCOUNTER — Encounter: Payer: Self-pay | Admitting: Internal Medicine

## 2017-01-16 VITALS — BP 158/70 | HR 77 | Ht 62.0 in | Wt 249.2 lb

## 2017-01-16 DIAGNOSIS — R942 Abnormal results of pulmonary function studies: Secondary | ICD-10-CM | POA: Diagnosis not present

## 2017-01-16 DIAGNOSIS — R059 Cough, unspecified: Secondary | ICD-10-CM

## 2017-01-16 DIAGNOSIS — J849 Interstitial pulmonary disease, unspecified: Secondary | ICD-10-CM

## 2017-01-16 DIAGNOSIS — J986 Disorders of diaphragm: Secondary | ICD-10-CM | POA: Diagnosis not present

## 2017-01-16 DIAGNOSIS — R062 Wheezing: Secondary | ICD-10-CM

## 2017-01-16 DIAGNOSIS — R05 Cough: Secondary | ICD-10-CM

## 2017-01-16 DIAGNOSIS — T464X5A Adverse effect of angiotensin-converting-enzyme inhibitors, initial encounter: Secondary | ICD-10-CM

## 2017-01-16 DIAGNOSIS — R0602 Shortness of breath: Secondary | ICD-10-CM

## 2017-01-16 MED ORDER — LOSARTAN POTASSIUM 50 MG PO TABS: 50.0000 mg | ORAL_TABLET | Freq: Every day | ORAL | 0 refills | Status: DC

## 2017-01-16 NOTE — Progress Notes (Signed)
Subjective:    Patient ID: [name], female    DOB: 10/11/1935, 81 y.o.   MRN: [id number]  PCP Aldine Contes, MD   HPI   IOV 01/16/2017  Chief Complaint  Patient presents with  . Advice Only    Referred by Dr. Dareen Piano due to abn. PFT 01/04/17. States that she has occ. coughing with burning in chest.   81 year old morbidly obese female.  Presents for new consult.  History is gleaned from talking to her and review of the primary care physician chart.  Patient reports a one-year history of shortness of breath that is of insidious onset it is not necessarily progressive.  It is definitely present on exertion.  She notices it when she would walk "quite a ways".  She could not quantify this.  In talking to her she tells me that she is not short of breath for activities of daily living inside the house such as changing clothes or doing the bed but she is short of breath if she were to climb a flight of stairs she would have to stop one time.  At this point shortness of breath is rated as moderate.  Rest relieves shortness of breath along with an albuterol rescue inhaler that she has been using for the last 1 week.  Associated with shortness of breath is a history of wheezing and coughing that is of much milder intensity for which the albuterol definitely helps.  The cough is dry in quality.  Med review shows that she is on ACE inhibitor  Personal visualization of chest x-rays dating back to 2013 and going forward to 2017 show chronic elevation of the left hemidiaphragm.  She does have a CT abdomen/pelvis done in 2015.  The lung cuts do show some basal pulmonary infiltrates but these could easily be atelectasis as opposed to ILD findings on account of her raised left hemidiaphragm.  Her pulmonary function test done on January 04, 2017 that I personally visualize shows restriction with reduced diffusion capacity of moderate severity Marlana Salvage is morbidly obese]  Review of the chart shows that she  has a systolic murmur for which an echo has been ordered although I do not see the echo result in the computer.  Her recent hemoglobin and creatinine are normal as documented below.   Walking desaturation test on 01/16/2017 185 feet x 3 laps:  did  ONLY 1 lap and stopped due to dsypnea and wheeze. Did NOT desaturate. Rest pulse ox was 100%, final pulse ox was 97%. HR response 77/min at rest to 115/min at peak exertion.   Results for FAITHE, ARIOLA (MRN [id number]) as of 01/16/2017 09:40  Ref. Range 01/04/2017 09:54  FVC-Pre Latest Units: L 1.22  FVC-%Pred-Pre Latest Units: % 66  FEV1-Pre Latest Units: L 0.99  FEV1-%Pred-Pre Latest Units: % 69  Pre FEV1/FVC ratio Latest Units: % 81  Results for Dafna, Romo Brandan L (MRN [id number]) as of 01/16/2017 09:40  Ref. Range 01/04/2017 09:54  TLC Latest Units: L 2.93  TLC % pred Latest Units: % 61  Results for NAREH, MATZKE (MRN [id number]) as of 01/16/2017 09:40  Ref. Range 01/04/2017 09:54  DLCO unc Latest Units: ml/min/mmHg 10.30  DLCO unc % pred Latest Units: % 47    Results for SANTA, ABDELRAHMAN (MRN [id number]) as of 01/16/2017 09:40  Ref. Range 10/26/2013 05:57 10/28/2013 05:34 10/29/2013 07:25 11/26/2014 16:18 01/28/2016 09:51  Hemoglobin Latest Ref Range: 12.0 - 15.0 g/dL 11.8 (L) 10.6 (L) 11.2 (L) 11.8 (  L) 11.1 (L)   Results for TIMIYAH, ROMITO (MRN [id number]) as of 01/16/2017 09:40  Ref. Range 01/28/2016 09:51 03/30/2016 11:19 05/26/2016 09:57 07/19/2016 10:44 11/22/2016 10:03  Creatinine Latest Ref Range: 0.57 - 1.00 mg/dL 0.81 0.78 0.85 0.86 0.84    has a past medical history of Allergic rhinitis, cause unspecified, Asthma, Cancer ([place]), Chronic back pain, GERD (gastroesophageal reflux disease) (05/19/2011), history of Bilateral leg edema-likely amlodipine induced. (07/28/2011), history of CAP (community acquired pneumonia) (07/14/2011), history of HYPOTHYROIDISM, BORDERLINE (03/20/2006), breast cancer, Hyperlipidemia, Hypertension, Hypothyroidism, Lumbago, OBESITY  NOS (03/20/2006), OSTEOARTHRITIS (12/13/2005), right shoulder pain, likely Impingement syndrome  (09/09/2010), and VENOUS INSUFFICIENCY, CHRONIC (03/20/2006).   reports that she quit smoking about 58 years ago. Her smoking use included cigarettes. She has a 0.12 pack-year smoking history. she has never used smokeless tobacco.  Past Surgical History:  Procedure Laterality Date  . ABDOMINAL HYSTERECTOMY    . BREAST LUMPECTOMY    . HERNIA REPAIR    . TOTAL HIP ARTHROPLASTY      Allergies  Allergen Reactions  . Acyclovir And Related   . Metoprolol Itching  . Food Rash and Other (See Comments)    Pt states that she is allergic to pickles.      Immunization History  Administered Date(s) Administered  . H1N1 03/26/2008  . Influenza Split 11/25/2010, 01/19/2012  . Influenza Whole 02/09/2006, 12/10/2007, 04/16/2009, 01/07/2010  . Influenza,inj,Quad PF,6+ Mos 02/15/2013, 12/09/2014, 01/12/2016, 11/22/2016  . Pneumococcal Conjugate-13 09/04/2014  . Pneumococcal Polysaccharide-23 09/10/2010  . Tdap 11/25/2010    Family History  Problem Relation Age of Onset  . Hypertension Mother   . Alzheimer's disease Mother   . Diabetes Sister      Current Outpatient Medications:  .  acetaminophen-codeine (TYLENOL #3) 300-30 MG tablet, Take 1 tablet by mouth every 8 (eight) hours as needed for moderate pain., Disp: 30 tablet, Rfl: 0 .  albuterol (PROAIR HFA) 108 (90 Base) MCG/ACT inhaler, Inhale 2 puffs into the lungs every 4 (four) hours as needed for wheezing or shortness of breath., Disp: 18 g, Rfl: 1 .  atorvastatin (LIPITOR) 40 MG tablet, Take 1 tablet (40 mg total) by mouth daily., Disp: 90 tablet, Rfl: 1 .  calcium citrate-vitamin D (CITRACAL+D) 315-200 MG-UNIT tablet, Take 1 tablet by mouth 2 (two) times daily., Disp: 120 tablet, Rfl: 3 .  carvedilol (COREG) 12.5 MG tablet, Take 1 tablet (12.5 mg total) 2 (two) times daily with a meal by mouth., Disp: 180 tablet, Rfl: 1 .  Cyanocobalamin  (VITAMIN B12 PO), Take 1 tablet daily by mouth., Disp: , Rfl:  .  diclofenac sodium (VOLTAREN) 1 % GEL, Apply 2 g topically as needed., Disp: 1 Tube, Rfl: 3 .  lisinopril (PRINIVIL,ZESTRIL) 40 MG tablet, TAKE 1 TABLET BY MOUTH ONCE DAILY, Disp: 90 tablet, Rfl: 1 .  pantoprazole (PROTONIX) 40 MG tablet, Take 1 tablet (40 mg total) by mouth daily., Disp: 90 tablet, Rfl: 2   Review of Systems  Constitutional: Negative for fever and unexpected weight change.  HENT: Negative for congestion, dental problem, ear pain, nosebleeds, postnasal drip, rhinorrhea, sinus pressure, sneezing, sore throat and trouble swallowing.   Eyes: Negative for redness and itching.  Respiratory: Positive for shortness of breath and wheezing. Negative for cough and chest tightness.   Cardiovascular: Positive for leg swelling. Negative for palpitations.  Gastrointestinal: Negative for nausea and vomiting.  Genitourinary: Negative for dysuria.  Musculoskeletal: Negative for joint swelling.  Skin: Negative for rash.  Allergic/Immunologic: Positive for environmental  allergies and food allergies. Negative for immunocompromised state.  Neurological: Negative for headaches.  Hematological: Bruises/bleeds easily.  Psychiatric/Behavioral: Negative for dysphoric mood. The patient is not nervous/anxious.        Objective:   Physical Exam  Constitutional: She is oriented to person, place, and time. She appears well-developed and well-nourished. No distress.  Morbidly obese female sitting comfortably  HENT:  Head: Normocephalic and atraumatic.  Right Ear: External ear normal.  Left Ear: External ear normal.  Mouth/Throat: Oropharynx is clear and moist. No oropharyngeal exudate.  Mallampati class II-3.  Denies history of snoring or using CPAP  Eyes: Conjunctivae and EOM are normal. Pupils are equal, round, and reactive to light. Right eye exhibits no discharge. Left eye exhibits no discharge. No scleral icterus.  Neck: Normal  range of motion. Neck supple. No JVD present. No tracheal deviation present. No thyromegaly present.  Cardiovascular: Normal rate, regular rhythm, normal heart sounds and intact distal pulses. Exam reveals no gallop and no friction rub.  No murmur heard. Pulmonary/Chest: Effort normal. No respiratory distress. She has no wheezes. She has rales. She exhibits no tenderness.  Some scattered crackles particularly at the base diminished air entry in the left base  Abdominal: Soft. Bowel sounds are normal. She exhibits no distension and no mass. There is no tenderness. There is no rebound and no guarding.  Obese abdomen  Musculoskeletal: Normal range of motion. She exhibits no edema or tenderness.  Antalgic gait  Lymphadenopathy:    She has no cervical adenopathy.  Neurological: She is alert and oriented to person, place, and time. She has normal reflexes. No cranial nerve deficit. She exhibits normal muscle tone. Coordination normal.  Skin: Skin is warm and dry. No rash noted. She is not diaphoretic. No erythema. No pallor.  Psychiatric: She has a normal mood and affect. Her behavior is normal. Judgment and thought content normal.  Vitals reviewed.   Vitals:   01/16/17 0936  BP: (!) 158/70  Pulse: 77  SpO2: 98%  Weight: 249 lb 3.2 oz (113 kg)  Height: 5\' 2"  (1.575 m)     Estimated body mass index is 45.58 kg/m as calculated from the following:   Height as of this encounter: 5\' 2"  (1.575 m).   Weight as of this encounter: 249 lb 3.2 oz (113 kg).       Assessment & Plan:     ICD-10-CM   1. Shortness of breath R06.02   2. Cough R05   3. Wheezing R06.2   4. Cough secondary to angiotensin converting enzyme inhibitor (ACE-I) R05    T46.4X5A   5. Chronically elevated hemidiaphragm J98.6   6. Morbid obesity ([place]) E66.01   7. Abnormal pulmonary function test R94.2      #Shortness of breath is due to weight and your left diaphragm breathing muscle probably not working.  It is also  possible that he might have scar tissue disease in the lung aka pulmonary fibrosis and we would need to rule this out  #Cough -this could be due to asthma but at this point we cannot test for that because your blood pressure medication lisinopril can be contributing to cough. Although if she has asthma (true) than coreg and lisinopril making it worse  Plan -Stop lisinopril; we will inform your primary care physician  Aldine Contes, MD - do high-resolution CT chest-supine and prone images do significant test -Looking at the left diaphragm due to the sniff test  Follow-up -Return to see me or my  nurse practitioner in the next few to several weeks -If cough or wheezing is still persisting at follow-up we will do a nitric oxide test looking for asthma    Dr. Brand Males, M.D., [facility].C.P Pulmonary and Critical Care Medicine Staff Physician [place] [facility] Pager: [phone], If no answer or between  15:00h - 7:00h: call [phone]  01/16/2017 9:59 AM

## 2017-01-16 NOTE — Telephone Encounter (Signed)
  Dear Dr Dareen Piano, Donalee Citrin, MD   Would you be kind enough to stop lisinopril for [name] . She has chronic cough and wheeze   Thanks  Dr. Brand Males, M.D., [facility].C.P Pulmonary and Critical Care Medicine Staff Physician [place] [facility] Pager: [phone], If no answer or between  15:00h - 7:00h: call [phone]  01/16/2017 10:06 AM

## 2017-01-16 NOTE — Telephone Encounter (Signed)
I will switch her to losartan. Thank you for seeing her so quickly.

## 2017-01-16 NOTE — Patient Instructions (Addendum)
ICD-10-CM   1. Shortness of breath R06.02   2. Cough R05   3. Wheezing R06.2   4. Cough secondary to angiotensin converting enzyme inhibitor (ACE-I) R05    T46.4X5A   5. Chronically elevated hemidiaphragm J98.6   6. Morbid obesity ([place]) E66.01   7. Abnormal pulmonary function test R94.2     #Shortness of breath is due to weight and your left diaphragm breathing muscle probably not working.  It is also possible that he might have scar tissue disease in the lung for pulmonary fibrosis and we would need to rule this out  #Cough -this could be due to asthma but at this point we cannot test for that because your blood pressure medication lisinopril can be contributing to cough  Plan -Stop lisinopril; we will inform your primary care physician  Aldine Contes, MD - do high-resolution CT chest-supine and prone images do significant test -Looking at the left diaphragm due to the sniff test  Follow-up -Return to see me or my nurse practitioner in the next few to several weeks -If cough or wheezing is still persisting at follow-up we will do a nitric oxide test looking for asthma

## 2017-01-16 NOTE — Telephone Encounter (Signed)
Thanks

## 2017-01-17 ENCOUNTER — Telehealth: Payer: Self-pay | Admitting: *Deleted

## 2017-01-17 NOTE — Telephone Encounter (Signed)
Per dr Dareen Piano attempted to notify pt of medication change, no answer, no vmail, will continue to try

## 2017-01-18 ENCOUNTER — Encounter (INDEPENDENT_AMBULATORY_CARE_PROVIDER_SITE_OTHER): Payer: PPO | Admitting: Ophthalmology

## 2017-01-18 ENCOUNTER — Telehealth: Payer: Self-pay | Admitting: Internal Medicine

## 2017-01-18 NOTE — Telephone Encounter (Signed)
I called the patient to discuss her recent visit to pulmonology.  Patient states that she has been scheduled for 2 more test with pulmonary to determine the etiology of her chronic cough and shortness of breath.  Patient is also advised to stop taking lisinopril (possible etiology for her cough) and to take losartan instead for her blood pressure.  Patient states that she is aware of this and picked up her losartan yesterday and has stopped taking her lisinopril.  Patient states that she feels well otherwise.  Patient expresses understanding and is in agreement with plan.

## 2017-01-19 ENCOUNTER — Encounter (INDEPENDENT_AMBULATORY_CARE_PROVIDER_SITE_OTHER): Payer: PPO | Admitting: Ophthalmology

## 2017-01-20 ENCOUNTER — Other Ambulatory Visit: Payer: Self-pay

## 2017-01-20 NOTE — Patient Outreach (Addendum)
[facility]) Care Management  01/20/2017  [name] [birth year] [id number]   Telephone Screen  Referral Date: 01/20/17 Referral Source: [employer] Referral Reason: "member unable to afford doctor visits so she has not been to dentist and can't pay for inhalers, fall risk, arthritis (right hip and back), Ashtma, HLD, HTN, GERD, morbid obesity(BMI 46.3) Insurance: HTA   Outreach attempt # 1 to patient. Spoke with patient and screening completed.    Social: Patient reports she resides in her home alone. She states that her dtr is there with her during the day. She voices that she is independent with ADLs/IADLs. She denies any recent falls. She voices that last fall was at the beginning of this year. She relies on her grandson to take to appts. DME in the home include cane, shower chair and BP machine.    Conditions: Per chart review patient has PMH of HTN, morbid obesity and borderline DM. Patient voices that her BP is controlled. She has machine in the home and monitors BP. She states that her diabetes is managed and under control. She was taken off meds and no longer checks blood sugars.    Medications: Patient voices that she is taking about five meds. She states the only med that she is unable to afford is her inhaler. She was originally able to afford med but reports funds are tight and she can not pay for albuterol/ProAir.   Appointments: Patient saw PCP on 12/27/16. She is followed by [facility] pulmonology as well and had an appt 01/16/17.   Advance Directives: None. RN CM discussed with patient at length the importance and need to have wishes in writing. She states that her family"already knows what to do." RN CM offered SW assistance with completing advance directives or even sending out packet to review but she adamantly declined.   Consent: [facility] services reviewed and discussed d with patient. Patient was not interested in services except for pharmacy  assistance. Patient voices that she has [facility] brochure in the home that was left by [employer] staff but is agreeable to RN CM mailing out 24hr Nurse Line magnet info.   Plan: RN CM will notify [facility] administrative assistant of case status. RN CM will send [facility] pharmacy referral for possible med assistance.  RN CM will send [facility] letter and magnet to patient.   Enzo [place], RN,BSN,CCM [place] [facility] Telephonic Care Management Coordinator Direct Phone: [phone] Toll Free: [phone] Fax: [phone]

## 2017-01-23 ENCOUNTER — Ambulatory Visit (HOSPITAL_COMMUNITY): Payer: PPO | Attending: Internal Medicine

## 2017-01-23 ENCOUNTER — Ambulatory Visit (HOSPITAL_COMMUNITY): Admission: RE | Admit: 2017-01-23 | Payer: PPO | Source: Ambulatory Visit

## 2017-01-24 ENCOUNTER — Other Ambulatory Visit: Payer: Self-pay | Admitting: Pharmacist

## 2017-01-24 NOTE — Patient Outreach (Signed)
[facility]) Care Management  01/24/2017  JERMEKA SCHLOTTERBECK [birth year] [id number]  Patient was referred to [place] Pharmacist by [place] for medication patient assistance, specifically for her inhaler.    Successful phone outreach to patient, HIPAA details verified.   Patient reports it wasn't a good time to call---offered to call back a different day and she accepted.   Plan:  Attempt to outreach patient again in the next week.   Karrie Meres, PharmD, [place] [phone]

## 2017-01-27 ENCOUNTER — Other Ambulatory Visit: Payer: Self-pay | Admitting: Pharmacist

## 2017-01-27 NOTE — Patient Outreach (Signed)
[facility]) Care Management  [place]   01/27/2017  [name] 1935-09-14 [id number]  Subjective:  Successful phone outreach to patient---HIPAA details verified.   Patient was referred to [facility] Pharmacist by [facility] RN Kandra Nicolas after receiving an [employer] referral regarding medication assistance.   Patient reports concerns with cost of albuterol inhaler.    She has a past medical history significant for hypertension, GERD, hyperlipidemia, osteoarthritis.    She reports she would be able to review her medications during call.   She denies other concerns or questions related to her medications.   Objective:   Current Medications: Current Outpatient Medications  Medication Sig Dispense Refill  . acetaminophen-codeine (TYLENOL #3) 300-30 MG tablet Take 1 tablet by mouth every 8 (eight) hours as needed for moderate pain. 30 tablet 0  . albuterol (PROAIR HFA) 108 (90 Base) MCG/ACT inhaler Inhale 2 puffs into the lungs every 4 (four) hours as needed for wheezing or shortness of breath. 18 g 1  . atorvastatin (LIPITOR) 40 MG tablet Take 1 tablet (40 mg total) by mouth daily. 90 tablet 1  . carvedilol (COREG) 12.5 MG tablet Take 1 tablet (12.5 mg total) 2 (two) times daily with a meal by mouth. 180 tablet 1  . Cyanocobalamin (VITAMIN B12 PO) Take 1 tablet daily by mouth.    . diclofenac sodium (VOLTAREN) 1 % GEL Apply 2 g topically as needed. 1 Tube 3  . losartan (COZAAR) 50 MG tablet Take 1 tablet (50 mg total) daily by mouth. 30 tablet 0  . pantoprazole (PROTONIX) 40 MG tablet Take 1 tablet (40 mg total) by mouth daily. 90 tablet 2  . calcium citrate-vitamin D (CITRACAL+D) 315-200 MG-UNIT tablet Take 1 tablet by mouth 2 (two) times daily. (Patient not taking: Reported on 01/27/2017) 120 tablet 3   No current facility-administered medications for this visit.     Functional Status: In your present state of health, do you have any difficulty performing the  following activities: 12/27/2016 11/22/2016  Hearing? N N  Vision? N N  Difficulty concentrating or making decisions? N N  Walking or climbing stairs? Y Y  Comment - hip pain  Dressing or bathing? N N  Doing errands, shopping? N N  Some recent data might be hidden    Fall/Depression Screening: Fall Risk  12/27/2016 11/22/2016 08/16/2016  Falls in the past year? Yes Yes Yes  Number falls in past yr: 1 1 -  Injury with Fall? Yes Yes -  Comment - - -  Risk Factor Category  High Fall Risk - -  Risk for fall due to : History of fall(s);Impaired balance/gait History of fall(s);Impaired balance/gait -  Risk for fall due to: Comment - - -  Follow up Falls prevention discussed Falls prevention discussed -   PHQ 2/9 Scores 01/20/2017 12/27/2016 11/22/2016 08/16/2016 07/19/2016 07/05/2016 05/26/2016  PHQ - 2 Score 0 0 0 0 0 0 0    Assessment:  Medication review per patient report and medication list in chart.   Drugs sorted by system:  Cardiovascular: -atorvastatin  -carvedilol -losartan  Pulmonary/Allergy: -albuterol inhaler   Gastrointestinal: -pantoprazole   Pain: -acetaminophen/codeine  -diclofenac gel   Vitamins/Minerals: -calcium/vitamin D---patient reports not presently taking -cyanocobalamin   Medication patient assistance:  -Discussed SSA Extra Help with patient--verified with medicare.gov website that patient is presently receiving Partial Extra Help at 50%.   This may limit patient assistance options for manufacturer patient assistance.   Unable to contact [employer] patient  assistance to see if patient may be able to apply as [employer]ck patient assistance was closed due to holiday.   Plan:  Will route medication review note to PCP.   Will place follow-up call to [employer] patient assistance and patient next week.    Karrie Meres, PharmD, [place] [phone]

## 2017-01-27 NOTE — Patient Outreach (Deleted)
[facility]) Care Management  01/27/2017  [name] 25-Apr-1935 [id number]

## 2017-01-31 ENCOUNTER — Ambulatory Visit: Payer: PPO | Admitting: Adult Health

## 2017-02-01 ENCOUNTER — Encounter (INDEPENDENT_AMBULATORY_CARE_PROVIDER_SITE_OTHER): Payer: PPO | Admitting: Ophthalmology

## 2017-02-01 DIAGNOSIS — H2511 Age-related nuclear cataract, right eye: Secondary | ICD-10-CM

## 2017-02-01 DIAGNOSIS — I10 Essential (primary) hypertension: Secondary | ICD-10-CM | POA: Diagnosis not present

## 2017-02-01 DIAGNOSIS — H35033 Hypertensive retinopathy, bilateral: Secondary | ICD-10-CM

## 2017-02-01 DIAGNOSIS — H34832 Tributary (branch) retinal vein occlusion, left eye, with macular edema: Secondary | ICD-10-CM | POA: Diagnosis not present

## 2017-02-01 DIAGNOSIS — H43813 Vitreous degeneration, bilateral: Secondary | ICD-10-CM | POA: Diagnosis not present

## 2017-02-02 ENCOUNTER — Ambulatory Visit: Payer: PPO | Admitting: Internal Medicine

## 2017-02-02 DIAGNOSIS — M1711 Unilateral primary osteoarthritis, right knee: Secondary | ICD-10-CM | POA: Diagnosis not present

## 2017-02-06 ENCOUNTER — Ambulatory Visit (HOSPITAL_COMMUNITY)
Admission: RE | Admit: 2017-02-06 | Discharge: 2017-02-06 | Disposition: A | Discharge status: Home / Self Care | Payer: PPO | Source: Ambulatory Visit | Attending: Internal Medicine | Admitting: Internal Medicine

## 2017-02-06 ENCOUNTER — Other Ambulatory Visit: Payer: Self-pay | Admitting: Pharmacist

## 2017-02-06 DIAGNOSIS — I2510 Atherosclerotic heart disease of native coronary artery without angina pectoris: Secondary | ICD-10-CM | POA: Insufficient documentation

## 2017-02-06 DIAGNOSIS — R0602 Shortness of breath: Secondary | ICD-10-CM | POA: Diagnosis not present

## 2017-02-06 DIAGNOSIS — J849 Interstitial pulmonary disease, unspecified: Secondary | ICD-10-CM

## 2017-02-06 DIAGNOSIS — I700 Atherosclerosis of aorta: Secondary | ICD-10-CM | POA: Diagnosis not present

## 2017-02-06 DIAGNOSIS — R911 Solitary pulmonary nodule: Secondary | ICD-10-CM | POA: Insufficient documentation

## 2017-02-06 DIAGNOSIS — R918 Other nonspecific abnormal finding of lung field: Secondary | ICD-10-CM | POA: Diagnosis not present

## 2017-02-06 NOTE — Patient Outreach (Signed)
[place] [facility]) Care Management  02/06/2017  DREONNA HUSSEIN 05/20/1935 [id number]  Unsuccessful phone outreach to patient, person answering phone reports patient not available.   No PHI exchanged.   Plan: Will make second outreach attempt in the next week.   Karrie Meres, PharmD, [place] [phone]

## 2017-02-07 ENCOUNTER — Other Ambulatory Visit: Payer: Self-pay | Admitting: Pharmacist

## 2017-02-07 ENCOUNTER — Encounter: Payer: Self-pay | Admitting: Internal Medicine

## 2017-02-07 ENCOUNTER — Ambulatory Visit: Payer: PPO | Admitting: Internal Medicine

## 2017-02-07 VITALS — BP 140/80 | HR 75 | Ht 62.0 in | Wt 249.2 lb

## 2017-02-07 DIAGNOSIS — J986 Disorders of diaphragm: Secondary | ICD-10-CM

## 2017-02-07 DIAGNOSIS — R0602 Shortness of breath: Secondary | ICD-10-CM

## 2017-02-07 DIAGNOSIS — T464X5A Adverse effect of angiotensin-converting-enzyme inhibitors, initial encounter: Secondary | ICD-10-CM | POA: Diagnosis not present

## 2017-02-07 DIAGNOSIS — R05 Cough: Secondary | ICD-10-CM

## 2017-02-07 NOTE — Addendum Note (Signed)
Addended by: Lorretta Harp on: 02/07/2017 12:25 PM   Modules accepted: Orders

## 2017-02-07 NOTE — Patient Instructions (Addendum)
ICD-10-CM   1. Shortness of breath R06.02   2. Cough secondary to angiotensin converting enzyme inhibitor (ACE-I) R05    T46.4X5A   3. Chronically elevated hemidiaphragm J98.6   4. Morbid obesity ([place]) E66.01      #Shortness of breath: better after stopping lisinopril. Residual symptom is due to weight and your left diaphragm breathing muscle probably not working.  - Please do sniff test at your convenience. -  Encourage weight loss./ You cannot do rehab due to joint issues.  - Await formal CT report; will call you   #Cough  Glad resolved after stopping ace inhibitor lisinopril    Follow-up - Do sniff test anytime between now and next 3 months -Return in 3 months

## 2017-02-07 NOTE — Patient Outreach (Signed)
[facility] [facility]) Care Management  02/07/2017  [name] 07-05-1935 [id number]  Second outreach attempt to patient, HIPAA details verified.   Follow-up call with patient regarding [employer] patient assistance for albuterol inhaler.    Per phone call to [employer] patient assistance---patient can complete application to have it evaluated for eligibility.  Discussed the application process for [employer]ck patient assistance and she reports she would like to apply to see if eligible.  She was counseled there is no guarantee her application will be approved.   Plan;  Verified address in chart is correct per patient.   Will have [place] mail patient application for [employer]ck patient assistance.   Karrie Meres, PharmD, [place] [phone]

## 2017-02-07 NOTE — Progress Notes (Signed)
Subjective:     Patient ID: [name], female   DOB: 05-May-1935, 81 y.o.   MRN: [id number]  HPI   PCP Aldine Contes, MD   HPI   IOV 01/16/2017  Chief Complaint  Patient presents with  . Advice Only    Referred by Dr. Dareen Piano due to abn. PFT 01/04/17. States that she has occ. coughing with burning in chest.   81 year old morbidly obese female.  Presents for new consult.  History is gleaned from talking to her and review of the primary care physician chart.  Patient reports a one-year history of shortness of breath that is of insidious onset it is not necessarily progressive.  It is definitely present on exertion.  She notices it when she would walk "quite a ways".  She could not quantify this.  In talking to her she tells me that she is not short of breath for activities of daily living inside the house such as changing clothes or doing the bed but she is short of breath if she were to climb a flight of stairs she would have to stop one time.  At this point shortness of breath is rated as moderate.  Rest relieves shortness of breath along with an albuterol rescue inhaler that she has been using for the last 1 week.  Associated with shortness of breath is a history of wheezing and coughing that is of much milder intensity for which the albuterol definitely helps.  The cough is dry in quality.  Med review shows that she is on ACE inhibitor  Personal visualization of chest x-rays dating back to 2013 and going forward to 2017 show chronic elevation of the left hemidiaphragm.  She does have a CT abdomen/pelvis done in 2015.  The lung cuts do show some basal pulmonary infiltrates but these could easily be atelectasis as opposed to ILD findings on account of her raised left hemidiaphragm.  Her pulmonary function test done on January 04, 2017 that I personally visualize shows restriction with reduced diffusion capacity of moderate severity Marlana Salvage is morbidly obese]  Review of the chart shows that  she has a systolic murmur for which an echo has been ordered although I do not see the echo result in the computer.  Her recent hemoglobin and creatinine are normal as documented below.   Walking desaturation test on 01/16/2017 185 feet x 3 laps:  did  ONLY 1 lap and stopped due to dsypnea and wheeze. Did NOT desaturate. Rest pulse ox was 100%, final pulse ox was 97%. HR response 77/min at rest to 115/min at peak exertion.    OV 02/07/2017  Chief Complaint  Patient presents with  . Follow-up    follow up for sob, only feels sob if walking a long way   Follow-up multifactorial dyspnea and cough  Cough: This is completely resolved after stopping ACE inhibitor  Shortness of breath: This is also improved significantly after stopping ACE inhibitor.  She still has some residual dyspnea on exertion relieved by rest no associated chest pain.  For this she had workup with a high-resolution CT chest.  Formal thoracic radiology interpretation is pending but in my personal visualization she only has raised left hemidiaphragm but no interstitial lung disease.  She was supposed to have significant test for this  raised hemidiaphragm but this was canceled presumably by the patient but she denies knowledge of this.  Her morbid obesity continues.  She cannot do pulmonary rehabilitation because of joint issues.  Nevertheless overall  she is able to walk longer distances.     Results for JACLYN, ANDY (MRN [id number]) as of 01/16/2017 09:40  Ref. Range 01/04/2017 09:54  FVC-Pre Latest Units: L 1.22  FVC-%Pred-Pre Latest Units: % 66  FEV1-Pre Latest Units: L 0.99  FEV1-%Pred-Pre Latest Units: % 69  Pre FEV1/FVC ratio Latest Units: % 81  Results for Mikya, Don Keimora L (MRN [id number]) as of 01/16/2017 09:40  Ref. Range 01/04/2017 09:54  TLC Latest Units: L 2.93  TLC % pred Latest Units: % 61  Results for EARSIE, HUMM (MRN [id number]) as of 01/16/2017 09:40  Ref. Range 01/04/2017 09:54  DLCO unc Latest Units:  ml/min/mmHg 10.30  DLCO unc % pred Latest Units: % 47     has a past medical history of Allergic rhinitis, cause unspecified, Asthma, Cancer ([place]), Chronic back pain, GERD (gastroesophageal reflux disease) (05/19/2011), history of Bilateral leg edema-likely amlodipine induced. (07/28/2011), history of CAP (community acquired pneumonia) (07/14/2011), history of HYPOTHYROIDISM, BORDERLINE (03/20/2006), breast cancer, Hyperlipidemia, Hypertension, Hypothyroidism, Lumbago, OBESITY NOS (03/20/2006), OSTEOARTHRITIS (12/13/2005), right shoulder pain, likely Impingement syndrome  (09/09/2010), and VENOUS INSUFFICIENCY, CHRONIC (03/20/2006).   reports that she quit smoking about 58 years ago. Her smoking use included cigarettes. She has a 0.12 pack-year smoking history. she has never used smokeless tobacco.  Past Surgical History:  Procedure Laterality Date  . ABDOMINAL HYSTERECTOMY    . BREAST LUMPECTOMY    . HERNIA REPAIR    . HIP CLOSED REDUCTION Right 11/26/2014   Procedure: CLOSED REDUCTIION RIGHT HIP;  Surgeon: Newt Minion, MD;  Location: [place];  Service: Orthopedics;  Laterality: Right;  . INCISIONAL HERNIA REPAIR N/A 10/29/2013   Procedure: OPEN REPAIR OF RECURRENT INCISIONAL HERNIA WITH MESH;  Surgeon: Zenovia Jarred, MD;  Location: [place];  Service: General;  Laterality: N/A;  . TOTAL HIP ARTHROPLASTY      Allergies  Allergen Reactions  . Acyclovir And Related   . Metoprolol Itching  . Food Rash and Other (See Comments)    Pt states that she is allergic to pickles.      Immunization History  Administered Date(s) Administered  . H1N1 03/26/2008  . Influenza Split 11/25/2010, 01/19/2012  . Influenza Whole 02/09/2006, 12/10/2007, 04/16/2009, 01/07/2010  . Influenza,inj,Quad PF,6+ Mos 02/15/2013, 12/09/2014, 01/12/2016, 11/22/2016  . Pneumococcal Conjugate-13 09/04/2014  . Pneumococcal Polysaccharide-23 09/10/2010  . Tdap 11/25/2010    Family History  Problem Relation Age of Onset  .  Hypertension Mother   . Alzheimer's disease Mother   . Diabetes Sister      Current Outpatient Medications:  .  albuterol (PROAIR HFA) 108 (90 Base) MCG/ACT inhaler, Inhale 2 puffs into the lungs every 4 (four) hours as needed for wheezing or shortness of breath., Disp: 18 g, Rfl: 1 .  atorvastatin (LIPITOR) 40 MG tablet, Take 1 tablet (40 mg total) by mouth daily., Disp: 90 tablet, Rfl: 1 .  calcium citrate-vitamin D (CITRACAL+D) 315-200 MG-UNIT tablet, Take 1 tablet by mouth 2 (two) times daily., Disp: 120 tablet, Rfl: 3 .  carvedilol (COREG) 12.5 MG tablet, Take 1 tablet (12.5 mg total) 2 (two) times daily with a meal by mouth., Disp: 180 tablet, Rfl: 1 .  Cyanocobalamin (VITAMIN B12 PO), Take 1 tablet daily by mouth., Disp: , Rfl:  .  diclofenac sodium (VOLTAREN) 1 % GEL, Apply 2 g topically as needed., Disp: 1 Tube, Rfl: 3 .  losartan (COZAAR) 50 MG tablet, Take 1 tablet (50 mg total) daily by mouth., Disp: 30  tablet, Rfl: 0 .  pantoprazole (PROTONIX) 40 MG tablet, Take 1 tablet (40 mg total) by mouth daily., Disp: 90 tablet, Rfl: 2    Review of Systems     Objective:   Physical Exam Vitals:   02/07/17 1200  BP: 140/80  Pulse: 75  SpO2: 96%  Weight: 249 lb 3.2 oz (113 kg)  Height: 5\' 2"  (1.575 m)    Discussion only visit but brief exam shows obese female using a cane left lower lobe crackles    Assessment:       ICD-10-CM   1. Shortness of breath R06.02   2. Cough secondary to angiotensin converting enzyme inhibitor (ACE-I) R05    T46.4X5A   3. Chronically elevated hemidiaphragm J98.6   4. Morbid obesity ([place]) E66.01        Plan:       #Shortness of breath: better after stopping lisinopril. Residual symptom is due to weight and your left diaphragm breathing muscle probably not working.  - Please do sniff test at your convenience. -  Encourage weight loss./ You cannot do rehab due to joint issues.  - Await formal CT report; will call you   #Cough  Glad  resolved after stopping ace inhibitor lisinopril    Follow-up - Do sniff test anytime between now and next 3 months -Return in 3 months   > 50% of this > 15 min visit spent in face to face counseling or coordination of care    Dr. Brand Males, M.D., [facility].C.P Pulmonary and Critical Care Medicine Staff Physician, [place] Director - Interstitial Lung Disease  Program  Pulmonary [place] [facility] [place], [place], [address]  Pager: [phone], If no answer or between  15:00h - 7:00h: call [phone] Telephone: [phone]

## 2017-02-08 ENCOUNTER — Other Ambulatory Visit: Payer: Self-pay | Admitting: Internal Medicine

## 2017-02-08 ENCOUNTER — Other Ambulatory Visit: Payer: Self-pay | Admitting: Pharmacy Technician

## 2017-02-08 NOTE — Patient Outreach (Addendum)
[place] [facility]) Care Management  02/08/2017  MARIGENE ERLER 1935-03-17 [id number]   Received Patient assistance information from Pharmacist Karrie Meres for patient. Mailed out patient portion of application to patient and mailed out provider portion to Dr. Dareen Piano today.  Maud Deed [place], [place] Management [phone]

## 2017-02-09 ENCOUNTER — Other Ambulatory Visit: Payer: Self-pay | Admitting: Pharmacy Technician

## 2017-02-09 NOTE — Patient Outreach (Signed)
[place] [facility] [facility]) Care Management  02/09/2017  [name] 1935/08/13 [id number]   Successful outreach call to [name]. HIPAA identifiers verified. I informed patient that I would be helping assist with her [employer] patient application for Proventil HFA inhaler. Informed her that I had mailed her portion to her yesterday and that she should receive it in the next 7-10 business days. Also informed patient that I highlighted portions of application that she is required to fill out. Requested patient to call me when she mails application back in. Will follow up with patient in 10 business days if I have not received application or call.  [name], [place] Management [phone]

## 2017-02-10 ENCOUNTER — Ambulatory Visit (HOSPITAL_COMMUNITY)
Admission: RE | Admit: 2017-02-10 | Discharge: 2017-02-10 | Disposition: A | Discharge status: Home / Self Care | Payer: PPO | Source: Ambulatory Visit | Attending: Internal Medicine | Admitting: Internal Medicine

## 2017-02-10 DIAGNOSIS — J988 Other specified respiratory disorders: Secondary | ICD-10-CM | POA: Diagnosis not present

## 2017-02-10 DIAGNOSIS — J986 Disorders of diaphragm: Secondary | ICD-10-CM | POA: Insufficient documentation

## 2017-02-12 ENCOUNTER — Telehealth: Payer: Self-pay | Admitting: Internal Medicine

## 2017-02-12 NOTE — Telephone Encounter (Signed)
Let [name] -> know that sniff test shows diaprhagm elevated but moves but moves slow. Contributing to her dyspnea. Will discuss at followup

## 2017-02-14 ENCOUNTER — Other Ambulatory Visit: Payer: Self-pay | Admitting: Internal Medicine

## 2017-02-15 NOTE — Telephone Encounter (Signed)
Called spoke with patient and discussed [place] Test results/recs as stated by MR Pt voiced her understanding Recall placed for March 2019 Nothing further needed; will sign off

## 2017-02-16 ENCOUNTER — Other Ambulatory Visit: Payer: Self-pay | Admitting: Internal Medicine

## 2017-02-16 MED ORDER — LOSARTAN POTASSIUM 50 MG PO TABS: 50.0000 mg | ORAL_TABLET | Freq: Every day | ORAL | 1 refills | Status: DC

## 2017-02-16 NOTE — Telephone Encounter (Signed)
Patient requesting a refill on Losartan 50mg  tablet, pharmacy is telling patient she is no longer on this medicine. Pls call patient

## 2017-02-20 ENCOUNTER — Ambulatory Visit: Payer: Self-pay | Admitting: Pharmacy Technician

## 2017-02-21 ENCOUNTER — Other Ambulatory Visit: Payer: Self-pay | Admitting: Pharmacy Technician

## 2017-02-21 NOTE — Patient Outreach (Signed)
[facility] [facility]) Care Management  02/21/2017  [name] Jul 08, 1935 [id number]   Unsuccessful outreach call to patient in reference to the Proventil HFA ([employer]ck) patient assistance application that was mailed out on 02/08/17. No answer, no voicemail set up. Will follow up in a couple of days.  Maud Deed [place], [place] Management [phone]

## 2017-02-23 ENCOUNTER — Ambulatory Visit: Payer: Self-pay | Admitting: Pharmacy Technician

## 2017-02-24 ENCOUNTER — Ambulatory Visit: Payer: Self-pay | Admitting: Pharmacy Technician

## 2017-03-09 ENCOUNTER — Other Ambulatory Visit: Payer: Self-pay | Admitting: Pharmacy Technician

## 2017-03-09 NOTE — Patient Outreach (Signed)
[facility]) Care Management  03/09/2017  [name] 10/04/1935 [id number]  Unsuccessful outreach call #2 made to patient in reference to [employer]ck application (Proventil HFA) I mailed to her for completion. No voicemail available, will follow up in 7 days.  Maud Deed [place], [place] Management [phone]

## 2017-03-16 ENCOUNTER — Other Ambulatory Visit: Payer: Self-pay | Admitting: Pharmacy Technician

## 2017-03-16 NOTE — Patient Outreach (Signed)
[place] [facility]) Care Management  03/16/2017  [name] Sep 25, 1935 [id number]  Successful Outreach call to [name]. HIPAA identifiers verified. Called in reference to Merck application (Proventil HFA). Patient states she has not received the patient portion of application that I mailed out to her on 02/08/17. Patient states that she does not got to her mail box often. She stated that she would have someone check her mail and call me back if she has received it.   Will follow up with patient on Monday 03/20/17 if I have not heard back.  Maud Deed [place], [place] Management [phone]

## 2017-03-17 ENCOUNTER — Encounter (INDEPENDENT_AMBULATORY_CARE_PROVIDER_SITE_OTHER): Payer: PPO | Admitting: Ophthalmology

## 2017-03-17 ENCOUNTER — Other Ambulatory Visit: Payer: Self-pay | Admitting: Pharmacy Technician

## 2017-03-17 NOTE — Patient Outreach (Signed)
[place] [facility]) Care Management  03/17/2017  [name] 07/06/1935 [id number]  Unsuccessful Incoming call from patient today. Patient left voicemail stating she has not received Merck application and has requested that I resend.  2:21pm  Unsuccessful Outreach call to patient. No voicemail.  Will mail out another Scientist, clinical (histocompatibility and immunogenetics).  Maud Deed [place], [place] Management [phone]

## 2017-03-20 ENCOUNTER — Ambulatory Visit: Payer: Self-pay | Admitting: Pharmacy Technician

## 2017-03-21 ENCOUNTER — Encounter (INDEPENDENT_AMBULATORY_CARE_PROVIDER_SITE_OTHER): Payer: PPO | Admitting: Ophthalmology

## 2017-03-21 DIAGNOSIS — H35033 Hypertensive retinopathy, bilateral: Secondary | ICD-10-CM

## 2017-03-21 DIAGNOSIS — I10 Essential (primary) hypertension: Secondary | ICD-10-CM | POA: Diagnosis not present

## 2017-03-21 DIAGNOSIS — H34832 Tributary (branch) retinal vein occlusion, left eye, with macular edema: Secondary | ICD-10-CM | POA: Diagnosis not present

## 2017-03-21 DIAGNOSIS — H43813 Vitreous degeneration, bilateral: Secondary | ICD-10-CM | POA: Diagnosis not present

## 2017-03-22 ENCOUNTER — Other Ambulatory Visit: Payer: Self-pay | Admitting: Pharmacy Technician

## 2017-03-22 NOTE — Patient Outreach (Signed)
[place] [facility]) Care Management  03/22/2017  ELVENIA GODDEN [birth year] [id number]   Unsuccessful outreach call to [name] in reference to [employer] (Proventil) patient assistance application.  Will follow up in 5 days to see if patient has received application that was mailed out 03/17/17.  Maud Deed [place], [employer] [place] Management [phone]

## 2017-03-24 ENCOUNTER — Ambulatory Visit: Payer: Self-pay | Admitting: Pharmacy Technician

## 2017-03-27 ENCOUNTER — Other Ambulatory Visit: Payer: Self-pay | Admitting: Pharmacy Technician

## 2017-03-27 NOTE — Patient Outreach (Addendum)
[place] [facility]) Care Management  03/27/2017  BLANCHE SCOVELL [birth year] [id number]   Unsuccessful outreach call #2  to [name] in reference to [employer] (Proventil) patient assistance application.  Maud Deed [place], [place] Management [phone]

## 2017-03-31 ENCOUNTER — Ambulatory Visit: Payer: Self-pay | Admitting: Pharmacy Technician

## 2017-04-04 ENCOUNTER — Telehealth: Payer: Self-pay | Admitting: Internal Medicine

## 2017-04-04 ENCOUNTER — Other Ambulatory Visit: Payer: Self-pay | Admitting: Pharmacy Technician

## 2017-04-04 DIAGNOSIS — I2510 Atherosclerotic heart disease of native coronary artery without angina pectoris: Secondary | ICD-10-CM

## 2017-04-04 DIAGNOSIS — I2584 Coronary atherosclerosis due to calcified coronary lesion: Principal | ICD-10-CM

## 2017-04-04 NOTE — Patient Outreach (Signed)
[place] [facility]) Care Management  04/04/2017  [name] Aug 04, 1935 [id number]   Successful outreach call to [name]. HIPAA identifiers not verified. No drug name or company mentioned. Patient informed me she mailed the application back on Friday 03/31/17.  Maud Deed [place], [place] Management [phone]

## 2017-04-04 NOTE — Telephone Encounter (Signed)
Let [name] know that ct scan dec 2018 is not showing reason for dyspnea but she does have   does have coronary artery calcification and I do not see that she has had cardiac stress test past few years; s  please refer to cardiologist - [facility] or Dr Einar Gip, first available  ................  IMPRESSION: 1. Image quality is markedly degraded by expiratory phase imaging. No definite evidence of interstitial lung disease. 2. Aortic atherosclerosis (ICD10-170.0). Coronary artery calcification. 3. Enlarged pulmonary arteries, indicative of pulmonary arterial hypertension. 4. 4 mm right middle lobe nodule, nonspecific. No follow-up needed if patient is low-risk. Non-contrast chest CT can be considered in 12 months if patient is high-risk. This recommendation follows the consensus statement: Guidelines for Management of Incidental Pulmonary Nodules Detected on CT Images: From the Fleischner Society 2017; Radiology 2017; 284:228-243.   Electronically Signed   By: Lorin Picket M.D.   On: 02/08/2017 09:08

## 2017-04-04 NOTE — Patient Outreach (Signed)
[name] [name]) Care Management  04/04/2017  [name] 1935/09/22 [id number]   Successful outreach call in reference to [employer] patient assistance application. Received patient portion to day and patient forgot to fill in income portion. Patient was able to provide info over the phone. Will mail out application tomorrow.  Maud Deed [place], [place] Management [phone]

## 2017-04-05 NOTE — Telephone Encounter (Signed)
Spoke with patient about results per Dr. Chase Caller. Patient reported she does not have a cardiologist and appreciates [place] referring her to a cardiologist. Order for cardiologist referal entered. Patient had no further questions at this time.

## 2017-04-13 ENCOUNTER — Ambulatory Visit: Payer: Self-pay | Admitting: Pharmacy Technician

## 2017-04-21 ENCOUNTER — Encounter (INDEPENDENT_AMBULATORY_CARE_PROVIDER_SITE_OTHER): Payer: Self-pay

## 2017-04-21 ENCOUNTER — Encounter: Payer: Self-pay | Admitting: Cardiology

## 2017-04-21 ENCOUNTER — Ambulatory Visit: Payer: PPO | Admitting: Cardiology

## 2017-04-21 VITALS — BP 187/102 | HR 78 | Ht 62.0 in | Wt 259.4 lb

## 2017-04-21 DIAGNOSIS — I10 Essential (primary) hypertension: Secondary | ICD-10-CM

## 2017-04-21 DIAGNOSIS — I288 Other diseases of pulmonary vessels: Secondary | ICD-10-CM

## 2017-04-21 DIAGNOSIS — E7800 Pure hypercholesterolemia, unspecified: Secondary | ICD-10-CM

## 2017-04-21 DIAGNOSIS — I2510 Atherosclerotic heart disease of native coronary artery without angina pectoris: Secondary | ICD-10-CM | POA: Diagnosis not present

## 2017-04-21 HISTORY — DX: Other diseases of pulmonary vessels: I28.8

## 2017-04-21 HISTORY — DX: Atherosclerotic heart disease of native coronary artery without angina pectoris: I25.10

## 2017-04-21 NOTE — Patient Instructions (Signed)
Medication Instructions:  Your physician recommends that you continue on your current medications as directed. Please refer to the Current Medication list given to you today.  Labwork: None ordered   Testing/Procedures: Your physician has requested that you have an echocardiogram. Echocardiography is a painless test that uses sound waves to create images of your heart. It provides your doctor with information about the size and shape of your heart and how well your heart's chambers and valves are working. This procedure takes approximately one hour. There are no restrictions for this procedure.  Your physician has requested that you have a lexiscan myoview. For further information please visit [email or web address]. Please follow instruction sheet, as given.   Follow-Up: Your physician wants you to follow-up as needed with Dr. Radford Pax.   Any Other Special Instructions Will Be Listed Below (If Applicable).  Thank you for choosing [facility], RN  [phone]  If you need a refill on your cardiac medications before your next appointment, please call your pharmacy.

## 2017-04-21 NOTE — Progress Notes (Signed)
Cardiology Office Note    Date:  04/21/2017   ID:  [name], DOB [birth year], MRN [id number]  PCP:  Natalie Contes, MD  Cardiologist:  Natalie Him, MD   Chief Complaint  Patient presents with  . New Patient (Initial Visit)    coronary artery calcifications    History of Present Illness:  [name] is a 81 y.o. female who is being seen today for the evaluation of coronary artery calcifications and enlarged pulmonary arteries noted on Chest CT at the request of Natalie Contes, MD.    This is a 82yo AAF with a history of asthma, GERD, HTN, hyperlipidemia and hypothyroidism who is referred after a chest CT showed coronary artery calcifications.  She has had chronic DOE for years which occurs only when walking.  She denies any chest pain or pressure, PND, orthopnea, LE edema, dizziness, palpitations or syncope. She smoked remotely in her 40's.  She has no family history of CAD.  She is compliant with her meds and is tolerating meds with no SE.     Past Medical History:  Diagnosis Date  . Allergic rhinitis, cause unspecified   . Asthma   . Cancer ([place])   . Chronic back pain   . Coronary artery calcification seen on CAT scan 04/21/2017  . Enlarged pulmonary artery ([place]) 04/21/2017   By chest CT  . GERD (gastroesophageal reflux disease) 05/19/2011  . history of Bilateral leg edema-likely amlodipine induced. 07/28/2011  . history of CAP (community acquired pneumonia) 07/14/2011  . history of HYPOTHYROIDISM, BORDERLINE 03/20/2006   Annotation: Asymptomatic, untreated Qualifier: Diagnosis of  By: Natalie Hosteller MD, [name].   . Hx of breast cancer   . Hyperlipidemia   . Hypertension   . Hypothyroidism   . Lumbago   . OBESITY NOS 03/20/2006   Qualifier: Diagnosis of  By: Natalie Hosteller MD, [place] 12/13/2005   Annotation: bilateral knees;right knee injection 6/05 Qualifier: Diagnosis of  By: Natalie Hosteller MD, Veronique D.   . right shoulder pain, likely Impingement  syndrome  09/09/2010  . VENOUS INSUFFICIENCY, CHRONIC 03/20/2006   Qualifier: Diagnosis of  By: Natalie Hosteller MD, [name].     Past Surgical History:  Procedure Laterality Date  . ABDOMINAL HYSTERECTOMY    . BREAST LUMPECTOMY    . HERNIA REPAIR    . HIP CLOSED REDUCTION Right 11/26/2014   Procedure: CLOSED REDUCTIION RIGHT HIP;  Surgeon: Natalie Minion, MD;  Location: [facility];  Service: Orthopedics;  Laterality: Right;  . INCISIONAL HERNIA REPAIR N/A 10/29/2013   Procedure: OPEN REPAIR OF RECURRENT INCISIONAL HERNIA WITH MESH;  Surgeon: Natalie Jarred, MD;  Location: [facility];  Service: General;  Laterality: N/A;  . TOTAL HIP ARTHROPLASTY      Current Medications: Current Meds  Medication Sig  . albuterol (PROAIR HFA) 108 (90 Base) MCG/ACT inhaler Inhale 2 puffs into the lungs every 4 (four) hours as needed for wheezing or shortness of breath.  [name] atorvastatin (LIPITOR) 40 MG tablet TAKE 1 TABLET BY MOUTH ONCE DAILY  . calcium citrate-vitamin D (CITRACAL+D) 315-200 MG-UNIT tablet Take 1 tablet by mouth 2 (two) times daily.  . carvedilol (COREG) 12.5 MG tablet Take 1 tablet (12.5 mg total) 2 (two) times daily with a meal by mouth.  . Cyanocobalamin (VITAMIN B12 PO) Take 1 tablet daily by mouth.  . diclofenac sodium (VOLTAREN) 1 % GEL Apply 2 g topically as needed.  [name] lisinopril (PRINIVIL,ZESTRIL) 40 MG tablet Take  1 tablet by mouth daily.  [name] losartan (COZAAR) 50 MG tablet Take 1 tablet (50 mg total) by mouth daily.  . pantoprazole (PROTONIX) 40 MG tablet Take 1 tablet (40 mg total) by mouth daily.    Allergies:   Acyclovir and related; Metoprolol; and Food   Social History   Socioeconomic History  . Marital status: Legally Separated    Spouse name: None  . Number of children: None  . Years of education: None  . Highest education level: None  Social Needs  . Financial resource strain: None  . Food insecurity - worry: None  . Food insecurity - inability: None  . Transportation needs -  medical: None  . Transportation needs - non-medical: None  Occupational History  . Occupation: not working  Tobacco Use  . Smoking status: Former Smoker    Packs/day: 0.25    Years: 0.50    Pack years: 0.12    Types: Cigarettes    Last attempt to quit: 09/09/1958    Years since quitting: 58.6  . Smokeless tobacco: Never Used  . Tobacco comment: social smoker  Substance and Sexual Activity  . Alcohol use: No    Alcohol/week: 0.0 oz  . Drug use: No  . Sexual activity: None  Other Topics Concern  . None  Social History Narrative   Lives with daughter.      Family History:  The patient's family history includes Alzheimer's disease in her mother; Diabetes in her sister; Hypertension in her mother.   ROS:   Please see the history of present illness.    ROS All other systems reviewed and are negative.  No flowsheet data found.     PHYSICAL EXAM:   VS:  BP (!) 187/102   Pulse 78   Ht 5\' 2"  (1.575 m)   Wt 259 lb 6.4 oz (117.7 kg)   BMI 47.44 kg/m    GEN: Well nourished, well developed, in no acute distress  HEENT: normal  Neck: no JVD, carotid bruits, or masses Cardiac: RRR; no murmurs, rubs, or gallops,no edema.  Intact distal pulses bilaterally.  Respiratory:  clear to auscultation bilaterally, normal work of breathing GI: soft, nontender, nondistended, + BS MS: no deformity or atrophy  Skin: warm and dry, no rash Neuro:  Alert and Oriented x 3, Strength and sensation are intact Psych: euthymic mood, full affect  Wt Readings from Last 3 Encounters:  04/21/17 259 lb 6.4 oz (117.7 kg)  02/07/17 249 lb 3.2 oz (113 kg)  01/16/17 249 lb 3.2 oz (113 kg)      Studies/Labs Reviewed:   EKG:  EKG is  ordered today.  The ekg ordered today demonstrates NSR at 78bpm with no ST changes  Recent Labs: 11/22/2016: BUN 16; Creatinine, Ser 0.84; Potassium 4.0; Sodium 143; TSH 4.060   Lipid Panel    Component Value Date/Time   CHOL 126 11/22/2016 1003   TRIG 63 11/22/2016  1003   HDL 54 11/22/2016 1003   CHOLHDL 2.3 11/22/2016 1003   CHOLHDL 2.5 09/04/2014 1037   VLDL 12 09/04/2014 1037   LDLCALC 59 11/22/2016 1003    Additional studies/ records that were reviewed today include:  Office notes from PCP    ASSESSMENT:    1. Coronary artery calcification seen on CAT scan   2. Essential hypertension   3. Pure hypercholesterolemia   4. Enlarged pulmonary artery (HCC)      PLAN:  In order of problems listed above:  1.  Coronary artery  calcifications by chest CT - she is asymptomatic on exam today.  I will get a Lexiscan myorive to rule out ischemia.    2.  HTN - her BP is well controlled on exam today.  She will continue on Carvedilol 12.5mg  BID and Losartan 50mg  daily.    3.  Hyperlipidemia with LDL goal < 70 given her DM and now coronary artery calcifications.  Her LDL was 59 this past fall.  This is followed by her PCP.    4.  Enlarged pulmonary arteries.  I will get 2D echo to assess for pulmonary HTN.  She has a history of asthma in the past.    Medication Adjustments/Labs and Tests Ordered: Current medicines are reviewed at length with the patient today.  Concerns regarding medicines are outlined above.  Medication changes, Labs and Tests ordered today are listed in the Patient Instructions below.  There are no Patient Instructions on file for this visit.   Signed, Natalie Him, MD  04/21/2017 10:44 AM    [address], [place], [place]  [address] Phone: [phone]; Fax: [phone]

## 2017-05-02 ENCOUNTER — Ambulatory Visit: Payer: PPO | Admitting: Internal Medicine

## 2017-05-02 ENCOUNTER — Encounter (INDEPENDENT_AMBULATORY_CARE_PROVIDER_SITE_OTHER): Payer: PPO | Admitting: Ophthalmology

## 2017-05-08 ENCOUNTER — Other Ambulatory Visit: Payer: Self-pay | Admitting: Pharmacy Technician

## 2017-05-08 NOTE — Patient Outreach (Signed)
[facility]) Care Management  05/08/2017  SALINA STANFIELD [birth year] [id number]   Unsuccessful Outreach call #1 to patient in reference to [employer] patient assistance.  Maud Deed [place], [place] Management [phone]

## 2017-05-09 ENCOUNTER — Telehealth (HOSPITAL_COMMUNITY): Payer: Self-pay | Admitting: *Deleted

## 2017-05-09 NOTE — Telephone Encounter (Signed)
Patient given detailed instructions per Myocardial Perfusion Study Information Sheet for the test on 05/12/17. Patient notified to arrive 15 minutes early and that it is imperative to arrive on time for appointment to keep from having the test rescheduled.  If you need to cancel or reschedule your appointment, please call the office within 24 hours of your appointment. . Patient verbalized understanding. [name]

## 2017-05-11 DIAGNOSIS — H401123 Primary open-angle glaucoma, left eye, severe stage: Secondary | ICD-10-CM | POA: Diagnosis not present

## 2017-05-11 DIAGNOSIS — H25811 Combined forms of age-related cataract, right eye: Secondary | ICD-10-CM | POA: Diagnosis not present

## 2017-05-11 DIAGNOSIS — H31092 Other chorioretinal scars, left eye: Secondary | ICD-10-CM | POA: Diagnosis not present

## 2017-05-11 DIAGNOSIS — Z961 Presence of intraocular lens: Secondary | ICD-10-CM | POA: Diagnosis not present

## 2017-05-12 ENCOUNTER — Ambulatory Visit (HOSPITAL_BASED_OUTPATIENT_CLINIC_OR_DEPARTMENT_OTHER): Payer: PPO

## 2017-05-12 ENCOUNTER — Ambulatory Visit (HOSPITAL_COMMUNITY): Payer: PPO | Attending: Internal Medicine

## 2017-05-12 ENCOUNTER — Other Ambulatory Visit: Payer: Self-pay

## 2017-05-12 DIAGNOSIS — I10 Essential (primary) hypertension: Secondary | ICD-10-CM | POA: Diagnosis not present

## 2017-05-12 DIAGNOSIS — I288 Other diseases of pulmonary vessels: Secondary | ICD-10-CM | POA: Diagnosis not present

## 2017-05-12 DIAGNOSIS — Z6841 Body Mass Index (BMI) 40.0 and over, adult: Secondary | ICD-10-CM | POA: Insufficient documentation

## 2017-05-12 DIAGNOSIS — E785 Hyperlipidemia, unspecified: Secondary | ICD-10-CM | POA: Diagnosis not present

## 2017-05-12 DIAGNOSIS — I2510 Atherosclerotic heart disease of native coronary artery without angina pectoris: Secondary | ICD-10-CM | POA: Diagnosis not present

## 2017-05-12 DIAGNOSIS — E039 Hypothyroidism, unspecified: Secondary | ICD-10-CM | POA: Diagnosis not present

## 2017-05-12 MED ORDER — PERFLUTREN LIPID MICROSPHERE
1.0000 mL | INTRAVENOUS | Status: AC | PRN
  Administered 2017-05-12: 3 mL via INTRAVENOUS

## 2017-05-12 MED ORDER — REGADENOSON 0.4 MG/5ML IV SOLN
0.4000 mg | Freq: Once | INTRAVENOUS | Status: AC
  Administered 2017-05-12: 0.4 mg via INTRAVENOUS

## 2017-05-12 MED ORDER — TECHNETIUM TC 99M TETROFOSMIN IV KIT
33.0000 | PACK | Freq: Once | INTRAVENOUS | Status: AC | PRN
  Administered 2017-05-12: 33 via INTRAVENOUS
  Filled 2017-05-12: qty 33

## 2017-05-15 DIAGNOSIS — Z961 Presence of intraocular lens: Secondary | ICD-10-CM | POA: Diagnosis not present

## 2017-05-15 DIAGNOSIS — H401123 Primary open-angle glaucoma, left eye, severe stage: Secondary | ICD-10-CM | POA: Diagnosis not present

## 2017-05-15 DIAGNOSIS — H31092 Other chorioretinal scars, left eye: Secondary | ICD-10-CM | POA: Diagnosis not present

## 2017-05-15 DIAGNOSIS — H25811 Combined forms of age-related cataract, right eye: Secondary | ICD-10-CM | POA: Diagnosis not present

## 2017-05-16 ENCOUNTER — Ambulatory Visit (HOSPITAL_COMMUNITY): Payer: PPO | Attending: Cardiovascular Disease

## 2017-05-16 ENCOUNTER — Other Ambulatory Visit: Payer: Self-pay | Admitting: Pharmacy Technician

## 2017-05-16 LAB — MYOCARDIAL PERFUSION IMAGING
CHL CUP NUCLEAR SDS: 0
CHL CUP NUCLEAR SSS: 3
LV dias vol: 104 mL (ref 46–106)
LVSYSVOL: 44 mL
Peak HR: 100 {beats}/min
RATE: 0.34
Rest HR: 75 {beats}/min
SRS: 3
TID: 0.99

## 2017-05-16 MED ORDER — TECHNETIUM TC 99M TETROFOSMIN IV KIT
30.7000 | PACK | Freq: Once | INTRAVENOUS | Status: AC | PRN
  Administered 2017-05-16: 30.7 via INTRAVENOUS
  Filled 2017-05-16: qty 31

## 2017-05-16 NOTE — Patient Outreach (Signed)
[facility]) Care Management  05/16/2017  [name] [birth year] [id number]   Successful outreach call to [name], HIPAA identifiers verified. Informed patient that [employer] patient assistance application has gotten lost in the mail. Offered to come to patients home so that she can sign application to try the process again. Patient stated that is fine.   Will take application to [name] home tomorrow for her to sign.  Maud Deed [place], [place] Management [phone]

## 2017-05-17 ENCOUNTER — Other Ambulatory Visit: Payer: Self-pay | Admitting: Pharmacy Technician

## 2017-05-17 NOTE — Patient Outreach (Signed)
[facility]) Care Management  05/17/2017  [name] Jan 14, 1936 [id number]   Received patient and provider portion of [employer] patient assistance application. Will mail to [employer] 05/18/17  Will follow up with patient once [employer] verifies they have received application.  Maud Deed [place], [place] Management [phone]

## 2017-05-19 ENCOUNTER — Encounter (INDEPENDENT_AMBULATORY_CARE_PROVIDER_SITE_OTHER): Payer: PPO | Admitting: Ophthalmology

## 2017-05-22 ENCOUNTER — Telehealth: Payer: Self-pay | Admitting: *Deleted

## 2017-05-22 MED ORDER — LOSARTAN POTASSIUM 100 MG PO TABS: 50.0000 mg | ORAL_TABLET | Freq: Every day | ORAL | 0 refills | Status: DC

## 2017-05-22 NOTE — Telephone Encounter (Signed)
Thank you Kaye 

## 2017-05-22 NOTE — Telephone Encounter (Addendum)
Received fax from pt's phramacy with the following message regarding rx for Losartan 50mg  once daily   "Notes to prescriber: This strength was recalled, could we change to 100mg  1/2 tablet daily, pt is out, thanks"   Will send to pcp for review, please advise.[name]3/18/201911:39 AM

## 2017-05-22 NOTE — Telephone Encounter (Signed)
Call to patient-she has been taking both losartan and lisinopril-pt instructed to hold her lisinopril until I again confirm with her pcp to stop this medication.  Pt verbalized understanding of both her losartan and lisinopril directions. Will contact pt again this afternoon to confirm instructions.[name]3/18/201912:02 PM

## 2017-05-23 ENCOUNTER — Other Ambulatory Visit: Payer: Self-pay | Admitting: Pharmacy Technician

## 2017-05-23 NOTE — Patient Outreach (Signed)
[facility]) Care Management  05/23/2017  [name] 10/19/1935 [id number]  Contacted [employer] patient assistance to verify if they have received application for patients Proventil that was mailed in on 03/14. Spoke to [place] who stated they had not received it as of yet. Representative stated there is still about a 2 week delay in processing applications.  Will contact [employer] 79/39 to see if application has been received.  Maud Deed [place], [place] Management [phone]

## 2017-05-30 ENCOUNTER — Ambulatory Visit (INDEPENDENT_AMBULATORY_CARE_PROVIDER_SITE_OTHER): Payer: PPO | Admitting: Internal Medicine

## 2017-05-30 ENCOUNTER — Encounter: Payer: Self-pay | Admitting: Internal Medicine

## 2017-05-30 VITALS — BP 151/86 | HR 75 | Temp 98.1°F | Ht 62.0 in | Wt 259.0 lb

## 2017-05-30 DIAGNOSIS — R911 Solitary pulmonary nodule: Secondary | ICD-10-CM | POA: Diagnosis not present

## 2017-05-30 DIAGNOSIS — M19011 Primary osteoarthritis, right shoulder: Secondary | ICD-10-CM

## 2017-05-30 DIAGNOSIS — I2584 Coronary atherosclerosis due to calcified coronary lesion: Secondary | ICD-10-CM | POA: Diagnosis not present

## 2017-05-30 DIAGNOSIS — I2510 Atherosclerotic heart disease of native coronary artery without angina pectoris: Secondary | ICD-10-CM

## 2017-05-30 DIAGNOSIS — E039 Hypothyroidism, unspecified: Secondary | ICD-10-CM | POA: Diagnosis not present

## 2017-05-30 DIAGNOSIS — I10 Essential (primary) hypertension: Secondary | ICD-10-CM

## 2017-05-30 DIAGNOSIS — R062 Wheezing: Secondary | ICD-10-CM

## 2017-05-30 DIAGNOSIS — Z853 Personal history of malignant neoplasm of breast: Secondary | ICD-10-CM

## 2017-05-30 DIAGNOSIS — R011 Cardiac murmur, unspecified: Secondary | ICD-10-CM

## 2017-05-30 DIAGNOSIS — J984 Other disorders of lung: Secondary | ICD-10-CM

## 2017-05-30 DIAGNOSIS — K219 Gastro-esophageal reflux disease without esophagitis: Secondary | ICD-10-CM | POA: Diagnosis not present

## 2017-05-30 DIAGNOSIS — M5441 Lumbago with sciatica, right side: Secondary | ICD-10-CM

## 2017-05-30 DIAGNOSIS — I288 Other diseases of pulmonary vessels: Secondary | ICD-10-CM

## 2017-05-30 HISTORY — DX: Solitary pulmonary nodule: R91.1

## 2017-05-30 MED ORDER — LOSARTAN POTASSIUM 100 MG PO TABS
100.0000 mg | ORAL_TABLET | Freq: Every day | ORAL | 2 refills | Status: DC
Start: 2017-05-30 — End: 2018-03-09

## 2017-05-30 MED ORDER — ATORVASTATIN CALCIUM 40 MG PO TABS: 40.0000 mg | ORAL_TABLET | Freq: Every day | ORAL | 1 refills | Status: DC

## 2017-05-30 MED ORDER — ALBUTEROL SULFATE HFA 108 (90 BASE) MCG/ACT IN AERS: 2.0000 | INHALATION_SPRAY | RESPIRATORY_TRACT | 1 refills | Status: DC | PRN

## 2017-05-30 MED ORDER — PANTOPRAZOLE SODIUM 40 MG PO TBEC: 40.0000 mg | DELAYED_RELEASE_TABLET | Freq: Every day | ORAL | 2 refills | Status: DC

## 2017-05-30 MED ORDER — ACETAMINOPHEN-CODEINE #3 300-30 MG PO TABS: 1.0000 | ORAL_TABLET | ORAL | 0 refills | Status: DC | PRN

## 2017-05-30 NOTE — Assessment & Plan Note (Signed)
-  Patient had coronary artery constipation seen on a CT scan done in December 2018 -Cardiology follow-up and recommendations appreciated -Patient had a normal myocardial perfusion imaging study done in March of this year -No further workup at this time

## 2017-05-30 NOTE — Assessment & Plan Note (Signed)
-  This problem is chronic and stable -Patient denies any symptoms of reflux currently -We will refill her Protonix today -No further workup at this time

## 2017-05-30 NOTE — Assessment & Plan Note (Signed)
-  This problem is chronic and stable -Patient denies any symptoms currently -We will follow-up TSH and free T4 levels today -No further workup at this time

## 2017-05-30 NOTE — Assessment & Plan Note (Signed)
-  Patient was noted to have a 4 mm right middle lobe nodule on high-resolution CT done on February 06, 2017 -She will need repeat CT chest in December this year for follow-up on this lung nodule especially given her history of breast cancer -No further workup at this time

## 2017-05-30 NOTE — Assessment & Plan Note (Signed)
-  This problem is chronic and stable -Patient with pain in her bilateral knees as well as her right shoulder likely secondary to OA -Patient states that her knee pain is well controlled with Voltaren gel but her shoulder pain remains uncontrolled -We will refill her Tylenol 3 today  -No further workup at this time

## 2017-05-30 NOTE — Assessment & Plan Note (Signed)
-  Patient was noted to have an enlarged pulmonary artery on high-resolution chest CT done in December of last year -2D echo was done which showed no evidence of pulmonary hypertension -No further workup at this time

## 2017-05-30 NOTE — Progress Notes (Signed)
   Subjective:    Patient ID: [name], female    DOB: Sep 10, 1935, 82 y.o.   MRN: [id number]  HPI  I have seen and examined this patient.  Patient is here for routine follow-up of her hypertension and GERD.  Patient complains of persistent pain in her right shoulder secondary to her osteoarthritis.  She was previously on Tylenol 3 but we stopped this on her last visit.  Patient states that the pain was persistent and that she would like to go back on this medication.  Patient denies any other complaints at this time.  She states she is compliant with all her medications.   Review of Systems  Constitutional: Negative.   HENT: Negative.   Respiratory: Negative.   Cardiovascular: Negative.   Gastrointestinal: Negative.   Musculoskeletal: Negative.   Neurological: Negative.   Psychiatric/Behavioral: Negative.        Objective:   Physical Exam  Constitutional: She is oriented to person, place, and time. She appears well-developed and well-nourished.  HENT:  Head: Normocephalic and atraumatic.  Mouth/Throat: No oropharyngeal exudate.  Neck: Neck supple.  Cardiovascular: Normal rate and regular rhythm.  Murmur heard. Systolic murmur grade 3 out of 6 heard on auscultation  Pulmonary/Chest: Effort normal and breath sounds normal. No respiratory distress.  Abdominal: Soft. Bowel sounds are normal. She exhibits no distension. There is no tenderness.  Musculoskeletal: Normal range of motion. She exhibits no edema.  Lymphadenopathy:    She has no cervical adenopathy.  Neurological: She is alert and oriented to person, place, and time.  Skin: Skin is warm.  Dry skin noted over the anterior aspects of both her shins with excoriations  Psychiatric: She has a normal mood and affect. Her behavior is normal.          Assessment & Plan:  Please see problem based charting for assessment and plan:

## 2017-05-30 NOTE — Assessment & Plan Note (Signed)
-  This problem is chronic and stable -Patient had a recent 2D echo showed no evidence of stenosis or regurgitation of any of her valves -No further workup at this time

## 2017-05-30 NOTE — Assessment & Plan Note (Signed)
BP Readings from Last 3 Encounters:  05/30/17 (!) 151/86  04/21/17 (!) 187/102  02/07/17 140/80    Lab Results  Component Value Date   NA 143 11/22/2016   K 4.0 11/22/2016   CREATININE 0.84 11/22/2016    Assessment: Blood pressure control:  Uncontrolled Progress toward BP goal:   Improved Comments: Patient is compliant with losartan 50 mg and carvedilol 12.5 mg twice daily  Plan: Medications:  We will increase her losartan to 100 mg daily and continue with her carvedilol at current dose Educational resources provided:   Self management tools provided:   Other plans: We will check a BMP today

## 2017-05-30 NOTE — Assessment & Plan Note (Signed)
-  This problem is chronic and stable -Patient states that she has difficulty getting around with just a cane -We discussed importance of weight loss and exercise -Patient would like a Rollator walker to help her ambulate especially given her history of her right hip prosthesis dislocation -We will order this for her today

## 2017-05-30 NOTE — Patient Instructions (Addendum)
-  It was a pleasure seeing you today -Please follow-up with me in 3 months -We will check some blood work today -Blood pressure is high.  We will change the losartan to 100 mg daily -We will try order a Rollator walker for you -I will refill your Tylenol 3 today.  -Please call me if you have any questions

## 2017-05-30 NOTE — Assessment & Plan Note (Addendum)
-  Patient follows up with Dr. Chase Caller for her shortness of breath -She had a high-resolution CT scan done in December which showed pulmonary nodules but no evidence of interstitial lung disease -PFTs done in October showed restrictive lung disease with severe diffusion defect -Patient will need follow-up with pulmonary for further workup -Refilled albuterol inhaler today

## 2017-05-30 NOTE — Assessment & Plan Note (Signed)
-  This problem is chronic and stable -We will refill her Tylenol 3 given for worsening symptoms of pain -Patient was previously on this and had a pain contract but this is discontinued since he was using this only sparingly.  Patient states that the pain is gotten worse and that she would like to go back on this medication for severe pain -We will refill her Tylenol 3 today and update her pain contract on her follow-up visit after assessing how many pills she is using.

## 2017-05-31 ENCOUNTER — Telehealth: Payer: Self-pay | Admitting: Internal Medicine

## 2017-05-31 LAB — BMP8+ANION GAP
ANION GAP: 15 mmol/L (ref 10.0–18.0)
BUN / CREAT RATIO: 18 (ref 12–28)
BUN: 16 mg/dL (ref 8–27)
CO2: 24 mmol/L (ref 20–29)
CREATININE: 0.9 mg/dL (ref 0.57–1.00)
Calcium: 10 mg/dL (ref 8.7–10.3)
Chloride: 103 mmol/L (ref 96–106)
GFR calc Af Amer: 70 mL/min/{1.73_m2} (ref >59)
GFR calc non Af Amer: 61 mL/min/{1.73_m2} (ref >59)
Glucose: 89 mg/dL (ref 65–99)
Potassium: 3.7 mmol/L (ref 3.5–5.2)
Sodium: 142 mmol/L (ref 134–144)

## 2017-05-31 LAB — TSH: TSH: 6.69 u[IU]/mL — AB (ref 0.450–4.500)

## 2017-05-31 LAB — T4, FREE: FREE T4: 1.14 ng/dL (ref 0.82–1.77)

## 2017-05-31 NOTE — Telephone Encounter (Signed)
I called the patient to discuss results of her lab work with her.  Her BMP was within normal limits and her free T4 was as well.  Her TSH was mildly elevated. She likely has subclinical hypothyroidism but we will not start Synthroid at this point.  Will monitor closely and consider rechecking her TSH on her follow-up visit.

## 2017-06-06 ENCOUNTER — Other Ambulatory Visit: Payer: Self-pay | Admitting: Pharmacy Technician

## 2017-06-06 NOTE — Patient Outreach (Signed)
[facility]) Care Management  06/06/2017  ISABELLAH SOBOCINSKI 1935-11-10 [id number]   Outreach call to [employer] patient assistance in reference to patients application for Proventil. Spoke to [place] whom stated that as of today the application has still not been processed. Will call back on 04/04.  Maud Deed [place], [place] Management [phone]

## 2017-06-08 ENCOUNTER — Ambulatory Visit: Payer: Self-pay | Admitting: Pharmacy Technician

## 2017-06-09 ENCOUNTER — Other Ambulatory Visit: Payer: Self-pay | Admitting: Pharmacy Technician

## 2017-06-09 NOTE — Patient Outreach (Signed)
[facility]) Care Management  06/09/2017  [name] [birth year] [id number]   Outreach call to [employer] patient assistance in reference to drug Proventil HFA application. Spoke to [place] who stated that they have yet to receive the application. I informed Derry Skill that this is the 2nd application process that has been started for patient and being told they still have not received application. She tried to get an override to have application faxed in however because Proventil is not considered a "Specialty" drug, approval to fax versus having to mail a new application could not be granted. [name] provided me with a different address to mail application too, that is their expedited application address.Marland KitchenMarland KitchenMarland KitchenMarland Kitchen[address], [place], [place], [place] Management [phone]

## 2017-06-12 ENCOUNTER — Other Ambulatory Visit: Payer: Self-pay | Admitting: Pharmacy Technician

## 2017-06-12 NOTE — Patient Outreach (Signed)
[facility]) Care Management  06/12/2017  [name] 04/11/1935 [id number]   Contacted [employer] patient assistance and spoke to [place]. Mateo Flow stated they still had not received application for patients Proventil, after reviewing with Mateo Flow, she stated that it seems like the provider had been printing out wrong application to fill out.  Sent Rph Mannie Stabile in basket to see if she can help me rectify this situation. Informed patient and will take an application to patients house to have signed tomorrow due to [employer] requiring original document.   Once all items are received will mail to [employer] patient again  [employer]. [place], [place] Management [phone]

## 2017-06-13 ENCOUNTER — Other Ambulatory Visit: Payer: Self-pay | Admitting: Pharmacy Technician

## 2017-06-13 NOTE — Patient Outreach (Signed)
[facility]) Care Management  06/13/2017  [name] 22-Aug-1935 [id number]   Went to patients home in regards to [employer] patient assistance. Had patient sign new application. Once provider portion is received, Will completed application to [employer].  Maud Deed [place], [place] Management [phone]

## 2017-06-30 ENCOUNTER — Other Ambulatory Visit: Payer: Self-pay | Admitting: Pharmacy Technician

## 2017-06-30 NOTE — Patient Outreach (Signed)
[facility]) Care Management  06/30/2017  [name] October 31, 1935 [id number]   Contacted [employer] patient assistance to follow up on patients application for Proventil HFA. Spoke to [employer] who stated that attestation letter was mailed to patient on 10/93, also provider license number is missing on application.  Successful outreach call to [name], HIPAA identifiers verified. I informed her that [employer] mailed out the attestation letter to her on 04/23 and requested that she contact me as soon as she receives it so that I can come out and help her fill it out and mail it back in for her. She stated she would do so.  Will contact patient on 5/2 if I have not heard from her before then.  Maud Deed [place], [place] Management [phone]

## 2017-07-05 ENCOUNTER — Other Ambulatory Visit: Payer: Self-pay | Admitting: Pharmacy Technician

## 2017-07-05 NOTE — Patient Outreach (Signed)
[facility]) Care Management  07/05/2017  KARIYA LAVERGNE 1936-01-03 [id number]   Successful return call to patient in regards to receiving attestation letter from [employer] patient assistance. Patient has agreed for me to come to her home tomorrow 05/02 at 11am to help her fill out for and mail back to company.  Maud Deed [place], [place] Management [phone]

## 2017-07-06 ENCOUNTER — Other Ambulatory Visit: Payer: Self-pay | Admitting: Pharmacy Technician

## 2017-07-06 ENCOUNTER — Encounter: Payer: Self-pay | Admitting: Pharmacy Technician

## 2017-07-06 NOTE — Patient Outreach (Signed)
[facility]) Care Management  07/06/2017  [name] 1936-02-04 [id number]   Picked up [employer] attestation letter from [name]. Filled out necessary information on behalf of patient and patient signed form. Will mail attestation letter to [employer] today.  Will follow up with patient in 7-14 days to update patient on delivery status.  Maud Deed [place], [place] Management [phone]

## 2017-07-14 IMAGING — DX DG HIP (WITH OR WITHOUT PELVIS) 2-3V*R*
4 series · 4 of 4 positions shown · non-contrast
Comparison: None.

CLINICAL DATA: Recent fall with right hip pain, initial encounter

EXAM:
DG HIP (WITH OR WITHOUT PELVIS) 2-3V RIGHT

[pelvis ap]
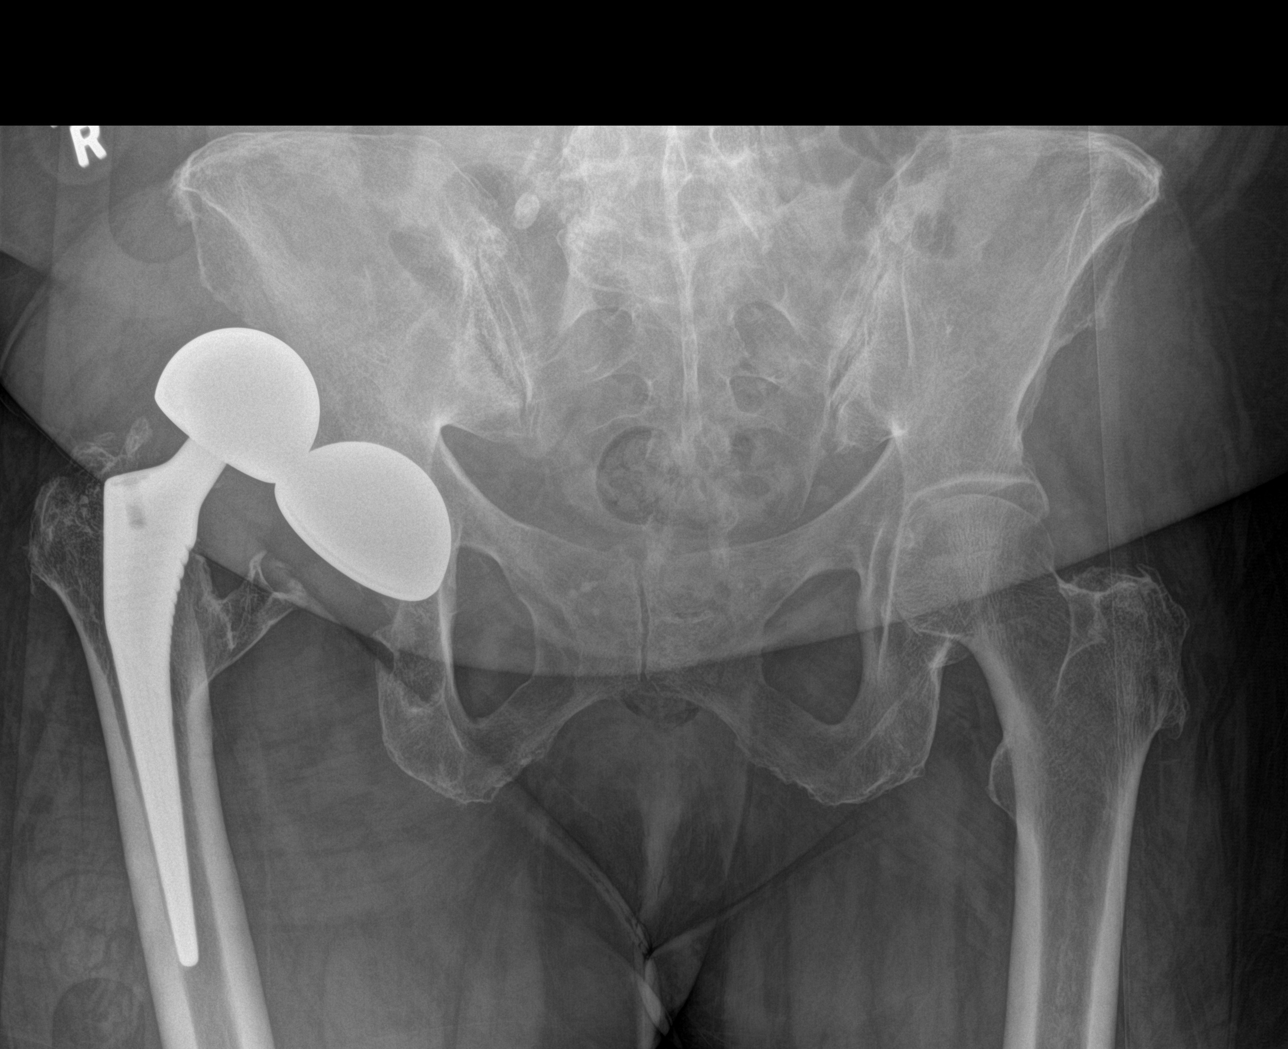

[hip ap]
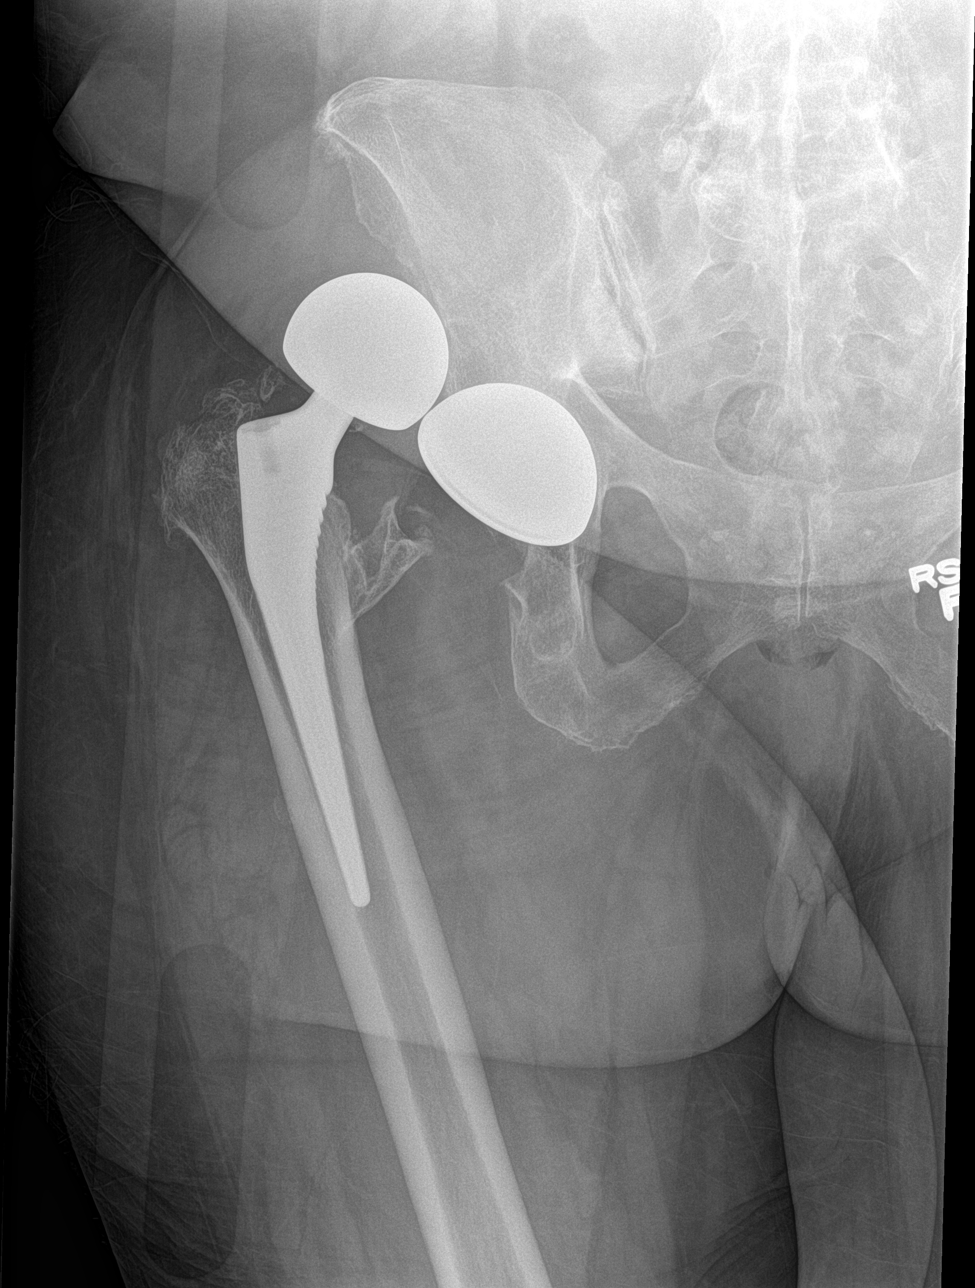

[hip x-table (1 of 2)]
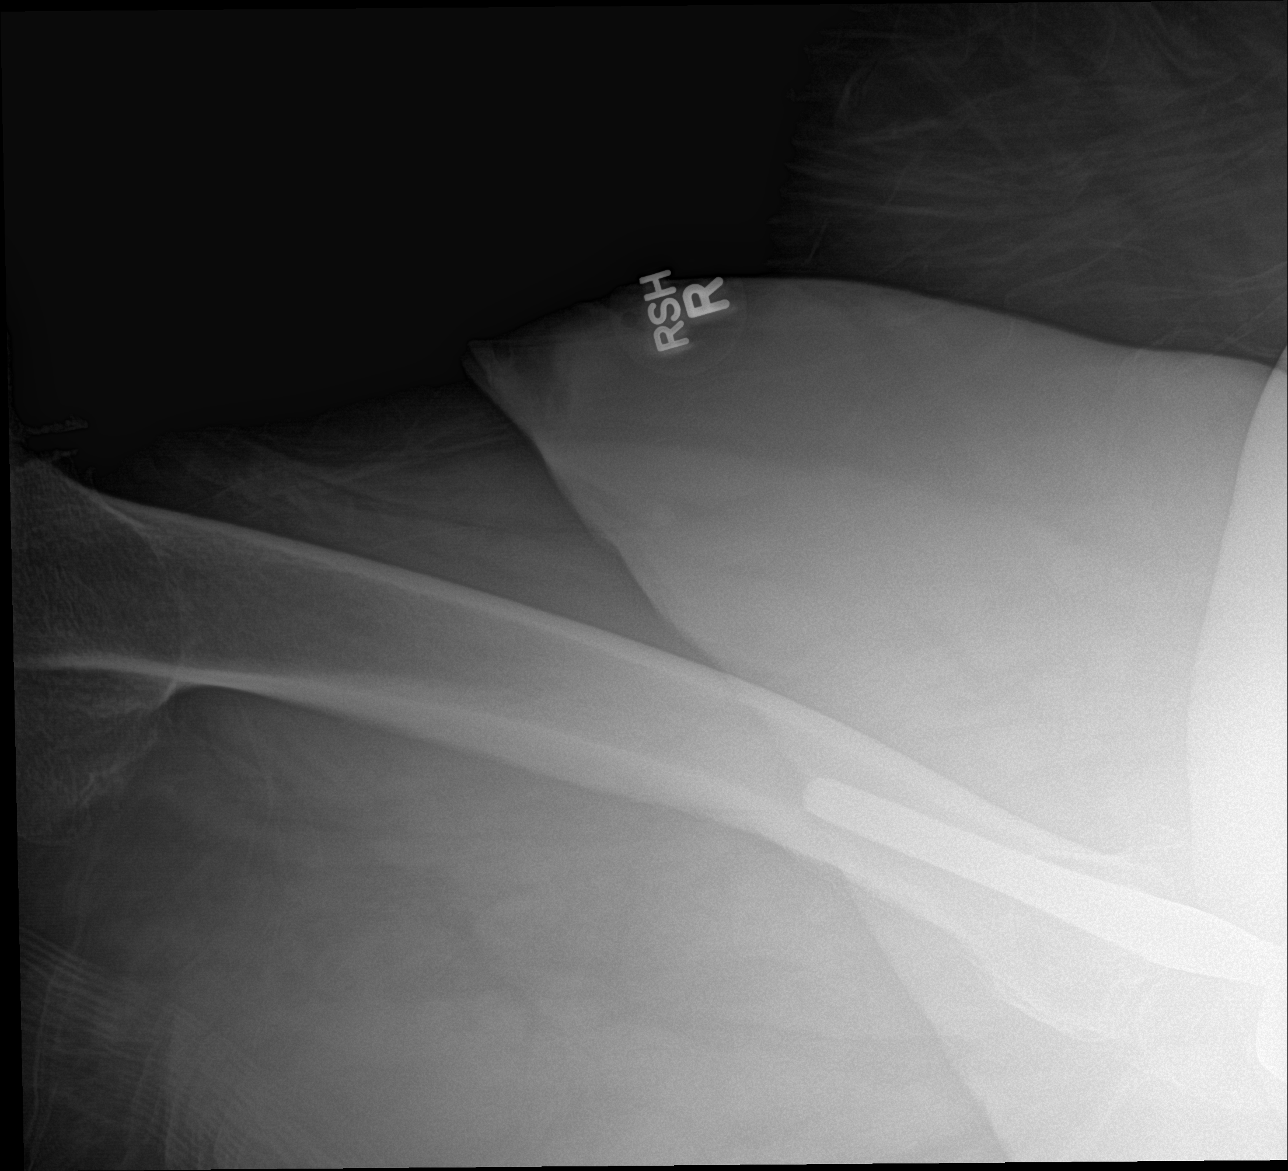

[hip x-table (2 of 2)]
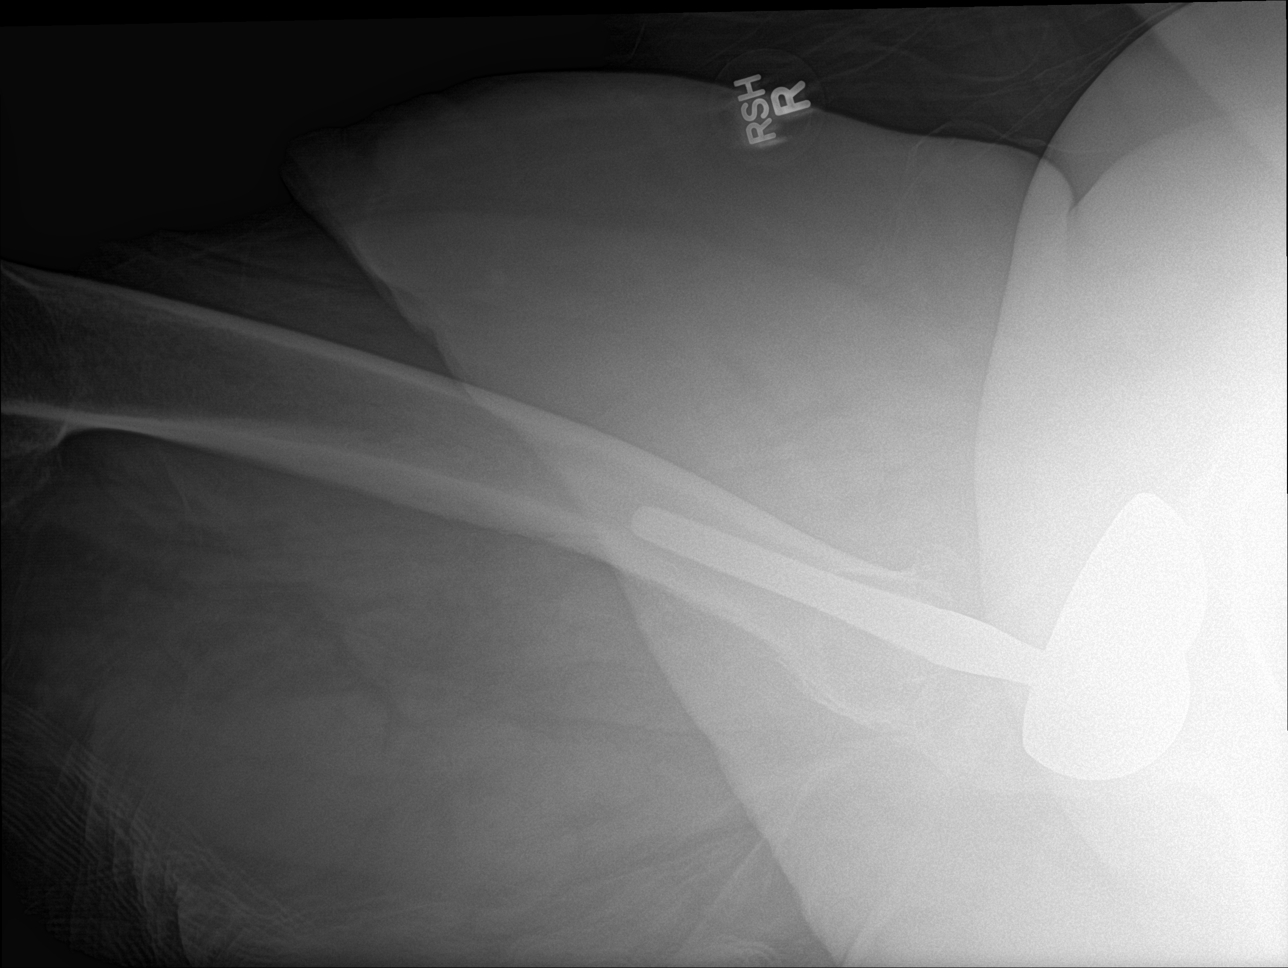

[4 of 4 positions shown; findings below may reference images not displayed]

FINDINGS: There is superior and posterior dislocation of the right hip
prosthesis with respect to the acetabular component. No acute
fracture is noted. Some dystrophic calcifications are noted along
the proximal femur.
IMPRESSION: Right hip prosthesis dislocation. No acute bony abnormality is
noted.

## 2017-07-14 IMAGING — DX DG FEMUR 2+V*R*
3 series · 3 of 3 positions shown · non-contrast
Comparison: None.

CLINICAL DATA: Recent fall with right hip pain, initial encounter

EXAM:
RIGHT FEMUR 2 VIEWS

[femur ap (1 of 2)]
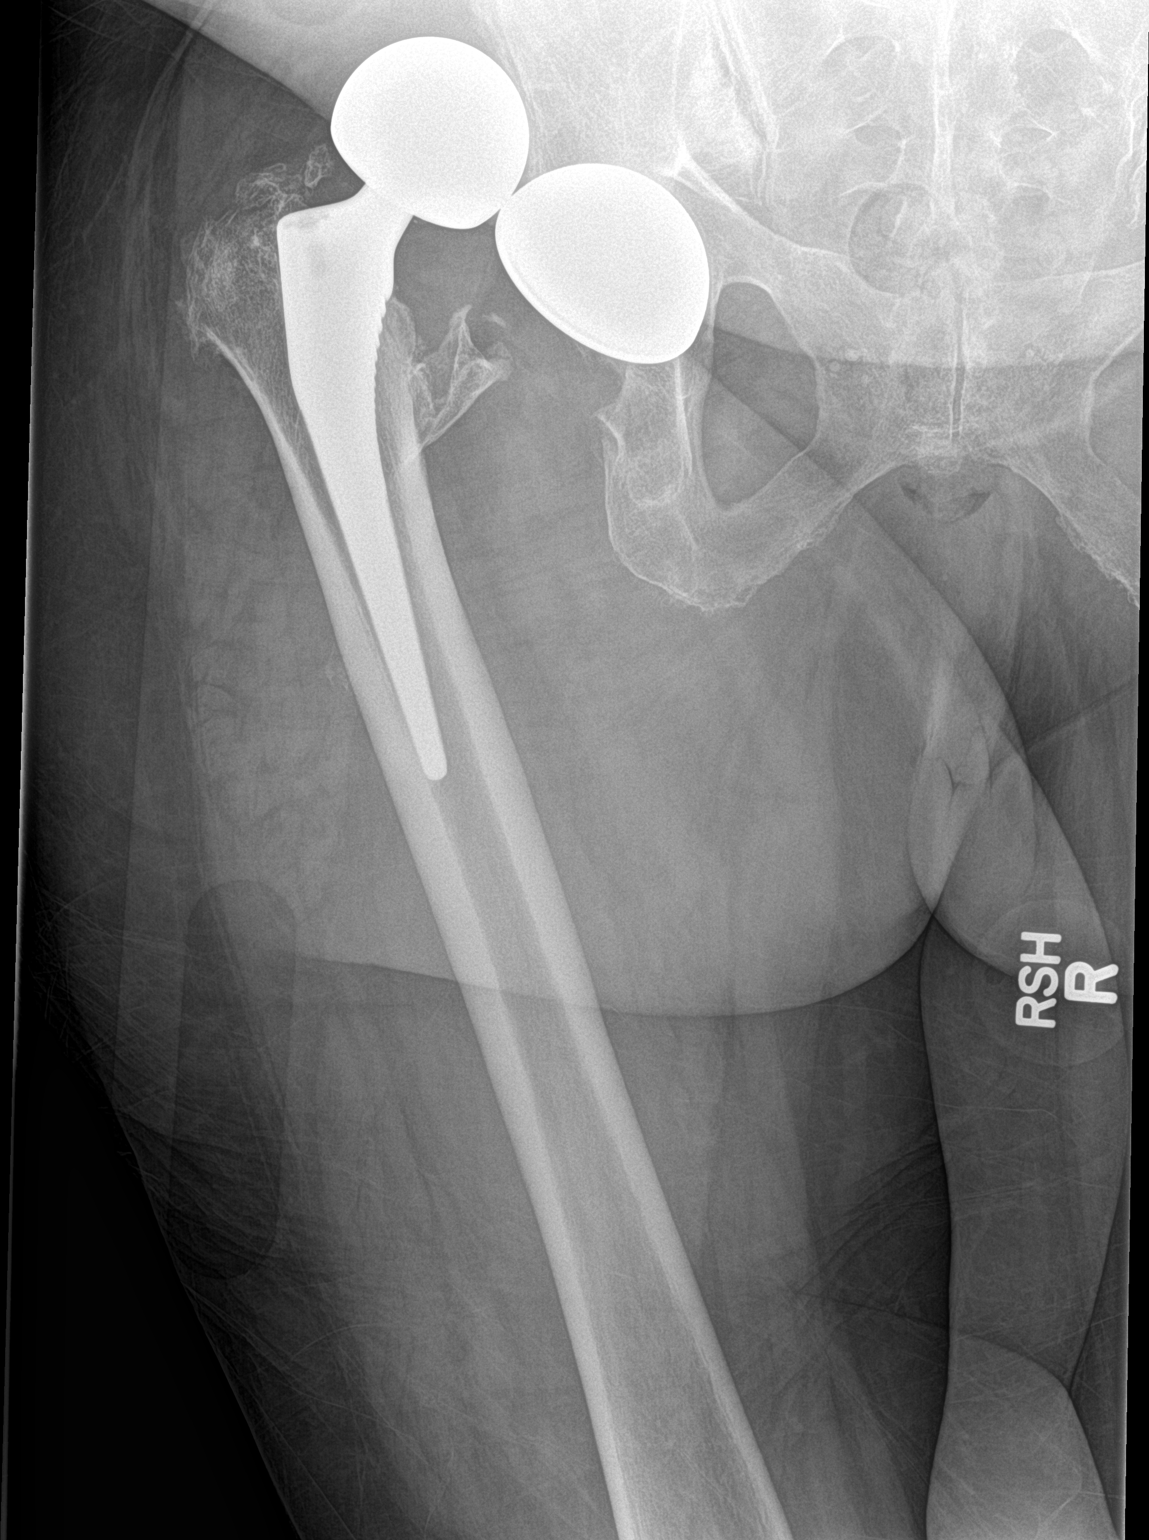

[femur ap (2 of 2)]
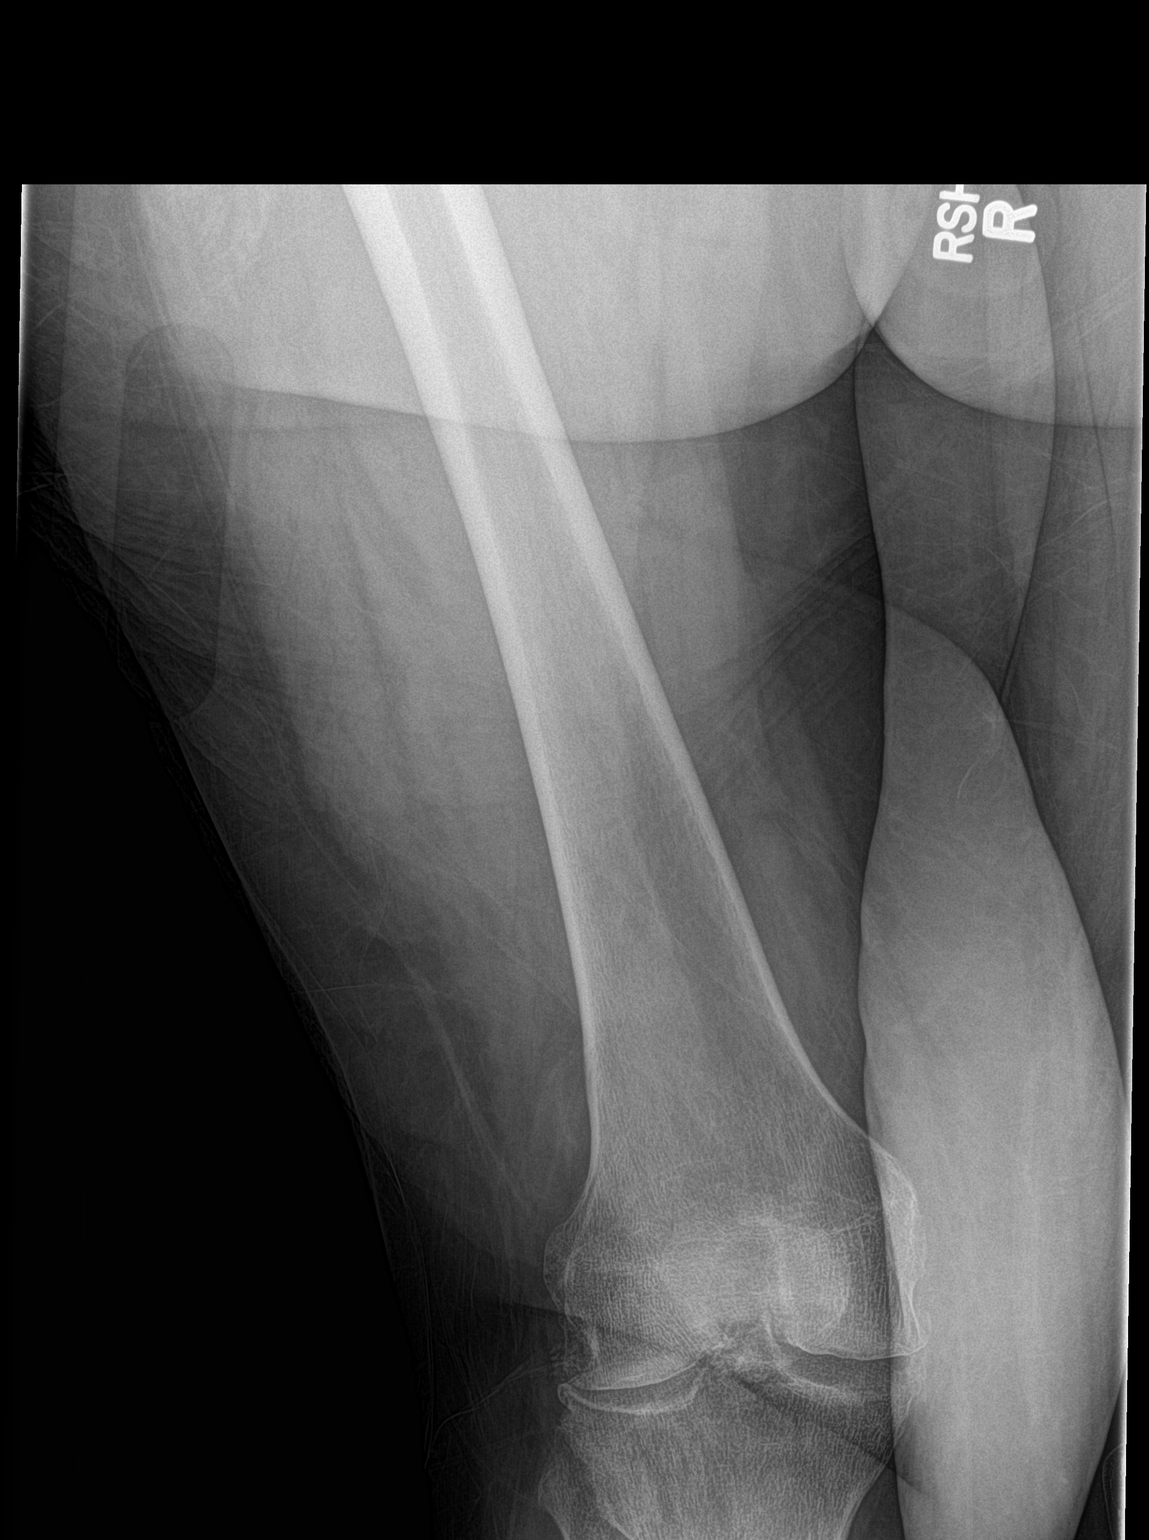

[femur lat]
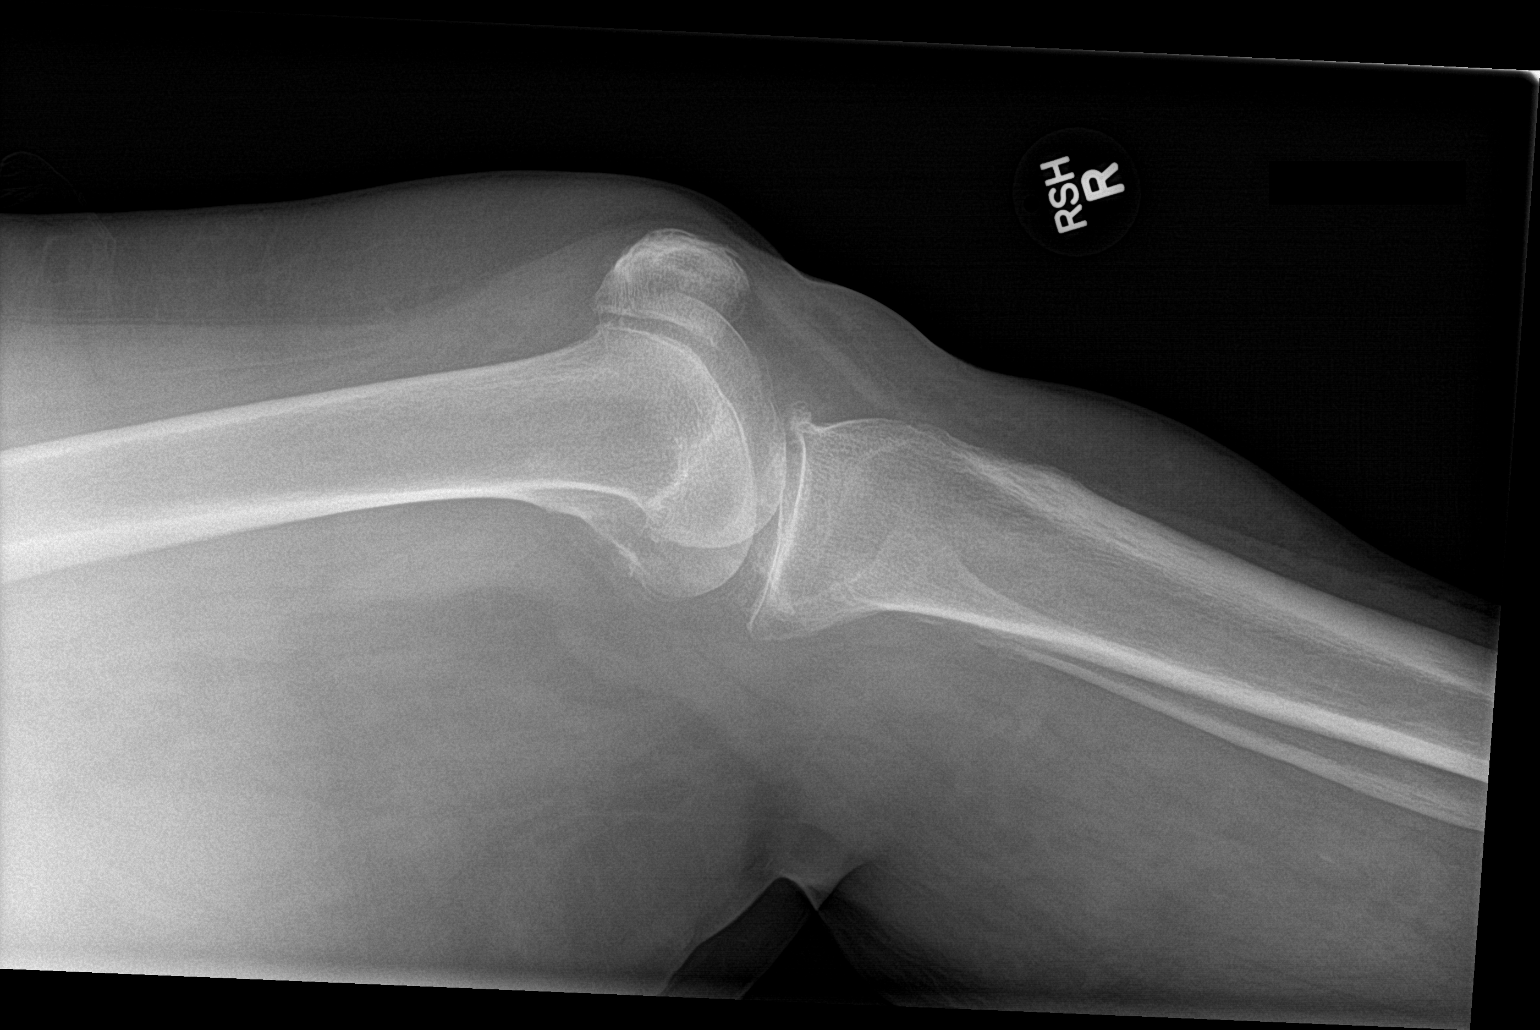

[3 of 3 positions shown; findings below may reference images not displayed]

FINDINGS: There is dislocation of the right femoral prosthesis superiorly.
Dystrophic calcification is noted along the medial aspect of the
proximal femur. Degenerative changes are noted in the knee joint
without joint effusion. No other focal abnormality is seen.
IMPRESSION: Right hip prosthesis dislocation. No other acute abnormality is
noted.

## 2017-07-27 ENCOUNTER — Other Ambulatory Visit: Payer: Self-pay | Admitting: Pharmacy Technician

## 2017-07-27 NOTE — Patient Outreach (Signed)
[facility]) Care Management  07/27/2017  [name] 1935-12-22 [id number]   Successful outreach call, HIPAA identifiers verified. [name] states that she has not received her Proventil inhaler in the mail as of yet. Informed her that it is the process of being shipped so it she should receive it soon.  Will follow up with patient in 2-3 business to see if med has been received.  Maud Deed [place], [place] Management [phone]

## 2017-08-01 ENCOUNTER — Other Ambulatory Visit: Payer: Self-pay | Admitting: Pharmacy Technician

## 2017-08-01 ENCOUNTER — Other Ambulatory Visit: Payer: Self-pay | Admitting: Pharmacist

## 2017-08-01 NOTE — Patient Outreach (Signed)
[facility]) Care Management  08/01/2017  MYONNA CHISOM [birth year] [id number]   Successful outreach call, HIPAA identifiers verified. Ms. Mumpower confirmed that she received her Proventil inhaler in the mail on Saturday. Reviewed with Ms. Treloar how to obtain refills for the inhaler when she needs it. She stated that she understood what to do.  Will route note to [place] for case closure.  Maud Deed [place], [place] Management [phone]

## 2017-08-01 NOTE — Patient Outreach (Signed)
[facility]) Care Management  08/01/2017  [name] January 09, 1936 [id number]  Received in basket message from [place], Caryl Pina, patient received assistance from [place] for albuterol [facility] inhaler and received medication per Ashley's note in this chart dated today, 08/01/17.    Plan:  Pharmacy episode will be closed.  Karrie Meres, PharmD, [place] [phone]

## 2017-09-05 ENCOUNTER — Ambulatory Visit (INDEPENDENT_AMBULATORY_CARE_PROVIDER_SITE_OTHER): Payer: PPO | Admitting: Internal Medicine

## 2017-09-05 ENCOUNTER — Encounter: Payer: Self-pay | Admitting: Internal Medicine

## 2017-09-05 ENCOUNTER — Other Ambulatory Visit: Payer: Self-pay

## 2017-09-05 VITALS — BP 168/80 | HR 72 | Temp 98.2°F | Ht 62.0 in | Wt 258.4 lb

## 2017-09-05 DIAGNOSIS — E039 Hypothyroidism, unspecified: Secondary | ICD-10-CM

## 2017-09-05 DIAGNOSIS — M19011 Primary osteoarthritis, right shoulder: Secondary | ICD-10-CM

## 2017-09-05 DIAGNOSIS — J984 Other disorders of lung: Secondary | ICD-10-CM

## 2017-09-05 DIAGNOSIS — R911 Solitary pulmonary nodule: Secondary | ICD-10-CM | POA: Diagnosis not present

## 2017-09-05 DIAGNOSIS — M5441 Lumbago with sciatica, right side: Secondary | ICD-10-CM

## 2017-09-05 DIAGNOSIS — Z6841 Body Mass Index (BMI) 40.0 and over, adult: Secondary | ICD-10-CM

## 2017-09-05 DIAGNOSIS — I10 Essential (primary) hypertension: Secondary | ICD-10-CM

## 2017-09-05 DIAGNOSIS — Z853 Personal history of malignant neoplasm of breast: Secondary | ICD-10-CM | POA: Diagnosis not present

## 2017-09-05 DIAGNOSIS — M25561 Pain in right knee: Secondary | ICD-10-CM | POA: Diagnosis not present

## 2017-09-05 DIAGNOSIS — Z79899 Other long term (current) drug therapy: Secondary | ICD-10-CM

## 2017-09-05 DIAGNOSIS — M159 Polyosteoarthritis, unspecified: Secondary | ICD-10-CM

## 2017-09-05 DIAGNOSIS — G8929 Other chronic pain: Secondary | ICD-10-CM | POA: Diagnosis not present

## 2017-09-05 DIAGNOSIS — R269 Unspecified abnormalities of gait and mobility: Secondary | ICD-10-CM

## 2017-09-05 MED ORDER — DICLOFENAC SODIUM 1 % TD GEL: 2.0000 g | TRANSDERMAL | 3 refills | Status: DC | PRN

## 2017-09-05 MED ORDER — ALBUTEROL SULFATE HFA 108 (90 BASE) MCG/ACT IN AERS: 2.0000 | INHALATION_SPRAY | RESPIRATORY_TRACT | 3 refills | Status: DC | PRN

## 2017-09-05 MED ORDER — CHLORTHALIDONE 25 MG PO TABS: 25.0000 mg | ORAL_TABLET | Freq: Every day | ORAL | 0 refills | Status: DC

## 2017-09-05 MED ORDER — ACETAMINOPHEN-CODEINE #3 300-30 MG PO TABS: 1.0000 | ORAL_TABLET | ORAL | 0 refills | Status: DC | PRN

## 2017-09-05 NOTE — Assessment & Plan Note (Signed)
-  This problem is chronic and stable -Patient with pain in her bilateral knees as well as her right shoulder likely secondary to OA -Patient states that her knee pain is well controlled with current gel -We will refill her Voltaren gel at this time -No further work-up for now

## 2017-09-05 NOTE — Assessment & Plan Note (Signed)
-  This problem is chronic and stable -Patient states that she has followed up with pulmonology and that no clear etiology has been identified -We will refill her albuterol inhaler today -Encouraged weight loss to see if this will help her with her shortness of breath

## 2017-09-05 NOTE — Progress Notes (Signed)
   Subjective:    Patient ID: [name], female    DOB: January 04, 1936, 82 y.o.   MRN: [id number]  HPI  I have seen and examined this patient.  Patient is here for routine follow-up of her hypertension as well as her chronic lower back pain.  Patient states that she ran out of her pain medication a while ago and that the pain in her lower back as well as the pain in her right knee have worsened.  She denies any other complaints at this time and feels well otherwise.  Patient states that she is compliant with all her medications.  Review of Systems  Constitutional: Negative.   HENT: Negative.   Respiratory: Negative.   Cardiovascular: Negative.   Gastrointestinal: Negative.   Musculoskeletal: Positive for arthralgias and gait problem.  Skin: Negative.   Neurological: Negative for dizziness, tremors and light-headedness.  Psychiatric/Behavioral: Negative.        Objective:   Physical Exam  Constitutional: She is oriented to person, place, and time. She appears well-developed and well-nourished.  HENT:  Head: Normocephalic and atraumatic.  Mouth/Throat: No oropharyngeal exudate.  Neck: Neck supple.  Cardiovascular: Normal rate and regular rhythm.  Pulmonary/Chest: Effort normal and breath sounds normal. She has no rales.  Abdominal: Soft. Bowel sounds are normal. She exhibits no distension. There is no tenderness.  Musculoskeletal: She exhibits no edema or tenderness.  Lymphadenopathy:    She has no cervical adenopathy.  Neurological: She is alert and oriented to person, place, and time.  Skin: Skin is warm. No rash noted.  Psychiatric: She has a normal mood and affect. Her behavior is normal.          Assessment & Plan:  Please see problem based charting for assessment and plan:

## 2017-09-05 NOTE — Patient Instructions (Addendum)
-  It was a pleasure seeing you today -I have refilled your pain medication for your lower back -I have put in an order for your Rollator walker and is spoken to the front desk about trying to obtain this for you -Your blood pressure is very high today.  We may need to add another blood pressure medication to your current regimen -You will need a repeat CT scan of your chest in December to follow-up on the lung nodule -Please call me if you have any questions

## 2017-09-05 NOTE — Assessment & Plan Note (Signed)
-  This problem is chronic and stable -Patient was advised to continue to exercise but states she finds walking difficult secondary to her pain and that she needs a walker to help her walk as the cane is not helping -We will place an order for Rollator walker today -Continue to encourage exercise as well as diet control

## 2017-09-05 NOTE — Assessment & Plan Note (Signed)
-  Patient was noted to have a 4 mm right middle lobe nodule on high-resolution CT done in December 2018 -We will follow-up repeat CT chest this December especially given her history of breast cancer -No further work-up at this time

## 2017-09-05 NOTE — Assessment & Plan Note (Signed)
BP Readings from Last 3 Encounters:  09/05/17 (!) 168/80  05/30/17 (!) 151/86  04/21/17 (!) 187/102    Lab Results  Component Value Date   NA 142 05/30/2017   K 3.7 05/30/2017   CREATININE 0.90 05/30/2017    Assessment: Blood pressure control:  Poorly controlled Progress toward BP goal:   Deteriorated Comments: Patient states that she is compliant with losartan 100 mg as well as carvedilol 12.5 milligrams twice daily  Plan: Medications:  We will add chlorthalidone 25 mg to her regimen Educational resources provided:   Self management tools provided:   Other plans: Patient will need to follow-up for a BMP in 1 month

## 2017-09-05 NOTE — Assessment & Plan Note (Signed)
-  This problem is chronic and stable -We will refill her Tylenol 3 today to help with her pain control -We will need to update her pain contract on her follow-up visit as she is still using this only sparingly -No further work-up at this time

## 2017-09-05 NOTE — Assessment & Plan Note (Signed)
-  This problem is chronic and stable -Patient noted to have a mildly elevated TSH on her last visit but her free T4 was normal -We will recheck her thyroid function on her follow-up visit

## 2017-10-10 ENCOUNTER — Other Ambulatory Visit: Payer: Self-pay

## 2017-10-10 ENCOUNTER — Encounter: Payer: Self-pay | Admitting: Internal Medicine

## 2017-10-10 ENCOUNTER — Ambulatory Visit (INDEPENDENT_AMBULATORY_CARE_PROVIDER_SITE_OTHER): Payer: PPO | Admitting: Internal Medicine

## 2017-10-10 VITALS — BP 138/94 | HR 70 | Temp 98.4°F | Ht 62.0 in | Wt 252.4 lb

## 2017-10-10 DIAGNOSIS — I10 Essential (primary) hypertension: Secondary | ICD-10-CM

## 2017-10-10 DIAGNOSIS — E039 Hypothyroidism, unspecified: Secondary | ICD-10-CM | POA: Diagnosis not present

## 2017-10-10 DIAGNOSIS — M5441 Lumbago with sciatica, right side: Secondary | ICD-10-CM

## 2017-10-10 DIAGNOSIS — R911 Solitary pulmonary nodule: Secondary | ICD-10-CM | POA: Diagnosis not present

## 2017-10-10 DIAGNOSIS — Z79899 Other long term (current) drug therapy: Secondary | ICD-10-CM

## 2017-10-10 DIAGNOSIS — Z6841 Body Mass Index (BMI) 40.0 and over, adult: Secondary | ICD-10-CM

## 2017-10-10 DIAGNOSIS — K219 Gastro-esophageal reflux disease without esophagitis: Secondary | ICD-10-CM | POA: Diagnosis not present

## 2017-10-10 MED ORDER — CHLORTHALIDONE 25 MG PO TABS: 25.0000 mg | ORAL_TABLET | Freq: Every day | ORAL | 0 refills | Status: DC

## 2017-10-10 NOTE — Assessment & Plan Note (Signed)
-  Patient was noted to have a 4 mm right middle lobe nodule in December 2018 on a high-resolution CT scan -We will check a repeat CT chest this December for follow-up -No further work-up at this time

## 2017-10-10 NOTE — Progress Notes (Signed)
   Subjective:    Patient ID: [name], female    DOB: June 26, 1935, 82 y.o.   MRN: [id number]  HPI  I have seen and examined this patient.  Patient is here for routine follow-up of her hypertension.  Patient states that she feels well currently and has no new complaints.  She has had no adverse reactions from her chlorthalidone which he started after her last visit.  Patient is compliant with all her medications.  Review of Systems  Constitutional: Negative.   HENT: Negative.   Respiratory: Negative.   Cardiovascular: Negative.   Gastrointestinal: Negative.   Musculoskeletal: Negative.   Neurological: Negative.   Psychiatric/Behavioral: Negative.        Objective:   Physical Exam  Constitutional: She is oriented to person, place, and time. She appears well-developed and well-nourished.  HENT:  Head: Normocephalic and atraumatic.  Mouth/Throat: No oropharyngeal exudate.  Neck: Neck supple.  Cardiovascular: Normal rate, regular rhythm and normal heart sounds.  Pulmonary/Chest: Effort normal and breath sounds normal. She has no wheezes. She has no rales.  Abdominal: Soft. Bowel sounds are normal. She exhibits no distension. There is no tenderness.  Musculoskeletal: Normal range of motion. She exhibits no edema.  Lymphadenopathy:    She has no cervical adenopathy.  Neurological: She is alert and oriented to person, place, and time.  Psychiatric: She has a normal mood and affect. Her behavior is normal.          Assessment & Plan:  Please see problem based charting for assessment and plan:

## 2017-10-10 NOTE — Assessment & Plan Note (Signed)
-  This problem is chronic and stable -Patient states that her back pain has improved slightly since her last visit -She uses Tylenol 3 2-3 times a week as needed for worsening back pain -She also uses some Voltaren gel over her lower back and states that this helps as well -We will continue with current management for now -No further work-up at this time

## 2017-10-10 NOTE — Assessment & Plan Note (Addendum)
BP Readings from Last 3 Encounters:  10/10/17 (!) 138/94  09/05/17 (!) 168/80  05/30/17 (!) 151/86    Lab Results  Component Value Date   NA 142 05/30/2017   K 3.7 05/30/2017   CREATININE 0.90 05/30/2017    Assessment: Blood pressure control:  Fair Progress toward BP goal:   Improved Comments: We will continue with losartan 100 mg as well as chlorthalidone 25 mg and carvedilol 12.5 mg twice daily  Plan: Medications:  continue current medications Educational resources provided:   Self management tools provided:   Other plans: We will check BMP today  Addendum: -Patient noted to have an elevated creatinine up to 1.33 and an elevated calcium up to 11 likely secondary to her chlorthalidone. -We will DC her chlorthalidone now and start her on hydralazine 25 mg 3 times daily -Patient will need follow-up in 1 month for repeat blood pressure check as well as repeat blood work -Case discussed with patient on the phone as well as with front desk to help her schedule an appointment.

## 2017-10-10 NOTE — Assessment & Plan Note (Addendum)
-  This problem is chronic and stable -Patient was noted to have mildly elevated TSH on her last blood work but with a normal free T4 -We will recheck her TSH and free T4 today  Addendum: -Patient noted to have a mildly elevated TSH but a normal free T4 -We will not start Synthroid for now -We will follow-up in 6 months

## 2017-10-10 NOTE — Patient Instructions (Signed)
-  It was a pleasure seeing today -Blood pressure has improved.  We will continue with the chlorthalidone for now -We will check some blood work on you today -I have spoken with the front desk about the walker and they will contact [facility] to see what the issue is -You have lost 6 pounds.  Keep up the great work -I will stop your pantoprazole once you finish your current bottle.  We will start you on ranitidine after that -Please call me if you have any questions

## 2017-10-10 NOTE — Assessment & Plan Note (Signed)
-  This problem is chronic and stable -Patient states that her insurance person called her and said that since she has been on pantoprazole for a long time that she needs to come off of it as it will affect her memory -She will complete her current bottle of pantoprazole but then wants to switch to another medication.  We will switch her to ranitidine on follow-up

## 2017-10-10 NOTE — Assessment & Plan Note (Signed)
-  This problem is chronic and stable -Patient has lost 6 pounds since her last visit -She is congratulated on this and encouraged to continue with her diet and exercise -We will attempt to get the patient a rolling walker

## 2017-10-11 ENCOUNTER — Telehealth: Payer: Self-pay | Admitting: Internal Medicine

## 2017-10-11 LAB — BMP8+ANION GAP
ANION GAP: 20 mmol/L — AB (ref 10.0–18.0)
BUN/Creatinine Ratio: 21 (ref 12–28)
BUN: 28 mg/dL — ABNORMAL HIGH (ref 8–27)
CO2: 24 mmol/L (ref 20–29)
Calcium: 11 mg/dL — ABNORMAL HIGH (ref 8.7–10.3)
Chloride: 96 mmol/L (ref 96–106)
Creatinine, Ser: 1.33 mg/dL — ABNORMAL HIGH (ref 0.57–1.00)
GFR, EST AFRICAN AMERICAN: 44 mL/min/{1.73_m2} — AB (ref >59)
GFR, EST NON AFRICAN AMERICAN: 38 mL/min/{1.73_m2} — AB (ref >59)
Glucose: 108 mg/dL — ABNORMAL HIGH (ref 65–99)
POTASSIUM: 3.8 mmol/L (ref 3.5–5.2)
SODIUM: 140 mmol/L (ref 134–144)

## 2017-10-11 LAB — TSH: TSH: 5.63 u[IU]/mL — ABNORMAL HIGH (ref 0.450–4.500)

## 2017-10-11 LAB — T4, FREE: FREE T4: 1.08 ng/dL (ref 0.82–1.77)

## 2017-10-11 MED ORDER — HYDRALAZINE HCL 25 MG PO TABS
25.0000 mg | ORAL_TABLET | Freq: Three times a day (TID) | ORAL | 0 refills | Status: DC
Start: 2017-10-11 — End: 2017-11-14

## 2017-10-11 NOTE — Telephone Encounter (Signed)
I called the patient to discuss results of her blood work with her.  She is noted to have an increased creatinine up to 1.33 and increased calcium up to 11.  This is likely secondary to her chlorthalidone.  Will DC her chlorthalidone and start her on hydralazine 25 mg 3 times daily in addition to her losartan 100 mg and carvedilol 12.5 mg twice daily.  Patient will need follow-up in 1 month for repeat blood work.  Will discuss with front desk to make appointment for her.  No further work-up at this time.  Patient is in agreement with plan.

## 2017-10-12 DIAGNOSIS — M5441 Lumbago with sciatica, right side: Secondary | ICD-10-CM | POA: Diagnosis not present

## 2017-11-14 ENCOUNTER — Encounter (INDEPENDENT_AMBULATORY_CARE_PROVIDER_SITE_OTHER): Payer: Self-pay

## 2017-11-14 ENCOUNTER — Ambulatory Visit (INDEPENDENT_AMBULATORY_CARE_PROVIDER_SITE_OTHER): Payer: PPO | Admitting: Internal Medicine

## 2017-11-14 ENCOUNTER — Other Ambulatory Visit: Payer: Self-pay

## 2017-11-14 VITALS — BP 143/90 | HR 84 | Temp 98.0°F

## 2017-11-14 DIAGNOSIS — R350 Frequency of micturition: Secondary | ICD-10-CM | POA: Diagnosis not present

## 2017-11-14 DIAGNOSIS — M5441 Lumbago with sciatica, right side: Secondary | ICD-10-CM | POA: Diagnosis not present

## 2017-11-14 DIAGNOSIS — I10 Essential (primary) hypertension: Secondary | ICD-10-CM

## 2017-11-14 DIAGNOSIS — R7303 Prediabetes: Secondary | ICD-10-CM

## 2017-11-14 DIAGNOSIS — Z0000 Encounter for general adult medical examination without abnormal findings: Secondary | ICD-10-CM

## 2017-11-14 DIAGNOSIS — Z79899 Other long term (current) drug therapy: Secondary | ICD-10-CM | POA: Diagnosis not present

## 2017-11-14 DIAGNOSIS — Z23 Encounter for immunization: Secondary | ICD-10-CM

## 2017-11-14 DIAGNOSIS — G8929 Other chronic pain: Secondary | ICD-10-CM | POA: Diagnosis not present

## 2017-11-14 HISTORY — DX: Frequency of micturition: R35.0

## 2017-11-14 LAB — GLUCOSE, CAPILLARY: GLUCOSE-CAPILLARY: 94 mg/dL (ref 70–99)

## 2017-11-14 LAB — POCT GLYCOSYLATED HEMOGLOBIN (HGB A1C): Hemoglobin A1C: 6.1 % — AB (ref 4.0–5.6)

## 2017-11-14 MED ORDER — CARVEDILOL 25 MG PO TABS: 25.0000 mg | ORAL_TABLET | Freq: Two times a day (BID) | ORAL | 0 refills | Status: DC

## 2017-11-14 NOTE — Assessment & Plan Note (Signed)
BP Readings from Last 3 Encounters:  11/14/17 (!) 143/90  10/10/17 (!) 138/94  09/05/17 (!) 168/80    Lab Results  Component Value Date   NA 140 10/10/2017   K 3.8 10/10/2017   CREATININE 1.33 (H) 10/10/2017    Assessment: Blood pressure control:  Fair Progress toward BP goal:   Deteriorated Comments: Patient is no longer taking her chlorthalidone per instructions on her last visit.  She is unable to tolerate hydralazine as it gave her headaches.  Currently she is taking only carvedilol 12.5 mg twice daily as well as losartan 100 mg.  Plan: Medications:  We will continue with the losartan and increase her carvedilol to 25 mg twice daily Educational resources provided:   Self management tools provided:   Other plans: Patient was noted to have a mildly elevated creatinine up to 1.3 as well as an elevated calcium level which is likely secondary to her chlorthalidone.  She is now off her chlorthalidone.  We will check BMP today

## 2017-11-14 NOTE — Assessment & Plan Note (Signed)
Flu shot given today

## 2017-11-14 NOTE — Patient Instructions (Signed)
-  It was a pleasure seeing you today -I have increased her carvedilol to 25 mg twice daily and called in a refill to your pharmacy.  -Please follow-up in 3 months -We will check some blood work on you today

## 2017-11-14 NOTE — Progress Notes (Signed)
   Subjective:    Patient ID: [name], female    DOB: [birth year], 82 y.o.   MRN: [id number]  HPI  I seen and examined this patient.  Patient is here for follow-up of her hypertension.  Patient is seen by me 1 month ago and was noted to have an elevated creatinine and calcium on blood work likely secondary to her chlorthalidone.  We DC'd the chlorthalidone that time and start the patient on hydralazine.  Patient states that she could not tolerate the hydralazine as it gave her headaches and to stop taking this.  Patient also complains of increased urinary frequency especially at night but states this is been going on a while. Patient is compliant with all her other medications and denies any other complaints at this time.  Review of Systems  Constitutional: Negative.   HENT: Negative.   Respiratory: Negative.   Cardiovascular: Negative.   Gastrointestinal: Negative.   Genitourinary: Positive for frequency.  Musculoskeletal: Positive for back pain. Negative for arthralgias and myalgias.  Skin: Negative.   Neurological: Negative.   Psychiatric/Behavioral: Negative.        Objective:   Physical Exam  Constitutional: She is oriented to person, place, and time. She appears well-developed and well-nourished.  HENT:  Head: Normocephalic and atraumatic.  Mouth/Throat: No oropharyngeal exudate.  Neck: Normal range of motion. Neck supple.  Cardiovascular: Normal rate and regular rhythm.  Pulmonary/Chest: Effort normal and breath sounds normal. She has no wheezes. She has no rales.  Abdominal: Soft. Bowel sounds are normal. She exhibits no distension. There is no tenderness.  Musculoskeletal: Normal range of motion. She exhibits no edema.  Mild tenderness noted over right lower back  Lymphadenopathy:    She has no cervical adenopathy.  Neurological: She is alert and oriented to person, place, and time.  Psychiatric: She has a normal mood and affect. Her behavior is normal.         Assessment & Plan:  Please see problem based charting for assessment and plan:

## 2017-11-14 NOTE — Assessment & Plan Note (Signed)
-  Patient states that for the last few months she has noted increased frequency of urination especially at night -Patient states that she goes to bed at 9 PM and wakes up at about 3 AM to urinate and then has to urinate every couple of hours after that to the morning.  She has not noted increased urination during the day.  She does state that she drinks a lot of water during the day and drinks an additional bottle of water right before bed. -I suspect that the increased frequency of urination is likely secondary to her drinking a large amount of water right before going to sleep -We will check a UA to make sure she does not have a urinary tract infection but this is very low likelihood as she has no dysuria, no fevers or chills and this issue appears to be chronic

## 2017-11-14 NOTE — Assessment & Plan Note (Signed)
-  Patient has a history of borderline diabetes -Her A1c today is 6.1 -No further work-up at this time -Patient encouraged to try and lose weight and to watch her diet -We will consider referring her to the Medicare diabetes prevention program on her follow-up visit

## 2017-11-14 NOTE — Assessment & Plan Note (Signed)
-  This problem is chronic and stable -Patient states that she gets pain over her right lower back -On exam she does have some mild tenderness over her right paraspinal region -Patient states that Voltaren gel does help her pain -We will continue with this as needed -No further work-up at this time

## 2017-11-15 ENCOUNTER — Telehealth: Payer: Self-pay | Admitting: Internal Medicine

## 2017-11-15 LAB — URINALYSIS, COMPLETE
Bilirubin, UA: NEGATIVE
GLUCOSE, UA: NEGATIVE
Leukocytes, UA: NEGATIVE
NITRITE UA: NEGATIVE
Protein, UA: NEGATIVE
RBC, UA: NEGATIVE
Specific Gravity, UA: 1.022 (ref 1.005–1.030)
Urobilinogen, Ur: 1 mg/dL (ref 0.2–1.0)
pH, UA: 6.5 (ref 5.0–7.5)

## 2017-11-15 LAB — MICROSCOPIC EXAMINATION
Bacteria, UA: NONE SEEN
Casts: NONE SEEN /lpf

## 2017-11-15 LAB — BMP8+ANION GAP
Anion Gap: 15 mmol/L (ref 10.0–18.0)
BUN / CREAT RATIO: 18 (ref 12–28)
BUN: 17 mg/dL (ref 8–27)
CO2: 24 mmol/L (ref 20–29)
Calcium: 9.5 mg/dL (ref 8.7–10.3)
Chloride: 104 mmol/L (ref 96–106)
Creatinine, Ser: 0.95 mg/dL (ref 0.57–1.00)
GFR calc non Af Amer: 57 mL/min/{1.73_m2} — ABNORMAL LOW (ref >59)
GFR, EST AFRICAN AMERICAN: 66 mL/min/{1.73_m2} (ref >59)
Glucose: 92 mg/dL (ref 65–99)
Potassium: 3.9 mmol/L (ref 3.5–5.2)
Sodium: 143 mmol/L (ref 134–144)

## 2017-11-15 LAB — SPECIMEN STATUS REPORT

## 2017-11-15 NOTE — Telephone Encounter (Signed)
I called the patient to discuss the results of her blood work with her.  Patient's creatinine and calcium levels are now back to normal after discontinuing her thiazide diuretic.  Her UA showed no evidence of an infection.  No further work-up at this time.  Patient expresses understanding and is in agreement with plan.

## 2017-12-07 DIAGNOSIS — H401123 Primary open-angle glaucoma, left eye, severe stage: Secondary | ICD-10-CM | POA: Diagnosis not present

## 2017-12-07 DIAGNOSIS — Z961 Presence of intraocular lens: Secondary | ICD-10-CM | POA: Diagnosis not present

## 2017-12-07 DIAGNOSIS — H25811 Combined forms of age-related cataract, right eye: Secondary | ICD-10-CM | POA: Diagnosis not present

## 2018-02-05 ENCOUNTER — Other Ambulatory Visit: Payer: Self-pay | Admitting: Internal Medicine

## 2018-02-05 ENCOUNTER — Other Ambulatory Visit: Payer: Self-pay | Admitting: Pharmacy Technician

## 2018-02-05 ENCOUNTER — Encounter: Payer: Self-pay | Admitting: Pharmacy Technician

## 2018-02-05 NOTE — Patient Outreach (Signed)
[facility]) Care Management  02/05/2018  [name] 02/09/1936 [id number]   Successful call to patient in regards to reapplying for [employer] patient assistance. HIPAA identifiers verified. Patient does want to re-apply for Proventil HFA for 2020. Prepared patient portion to be mailed and interofficed provider portion.  Will follow up with patient in 5-7 business days to confirm application has been received.  Maud Deed Chana Bode [place] Certified Pharmacy Technician [place] Management Direct Dial:[phone]

## 2018-02-13 ENCOUNTER — Other Ambulatory Visit: Payer: Self-pay

## 2018-02-13 ENCOUNTER — Encounter: Payer: Self-pay | Admitting: Internal Medicine

## 2018-02-13 ENCOUNTER — Ambulatory Visit (INDEPENDENT_AMBULATORY_CARE_PROVIDER_SITE_OTHER): Payer: PPO | Admitting: Internal Medicine

## 2018-02-13 DIAGNOSIS — G8929 Other chronic pain: Secondary | ICD-10-CM | POA: Diagnosis not present

## 2018-02-13 DIAGNOSIS — Z6841 Body Mass Index (BMI) 40.0 and over, adult: Secondary | ICD-10-CM

## 2018-02-13 DIAGNOSIS — M5441 Lumbago with sciatica, right side: Secondary | ICD-10-CM | POA: Diagnosis not present

## 2018-02-13 DIAGNOSIS — Z853 Personal history of malignant neoplasm of breast: Secondary | ICD-10-CM

## 2018-02-13 DIAGNOSIS — R911 Solitary pulmonary nodule: Secondary | ICD-10-CM

## 2018-02-13 DIAGNOSIS — Z79899 Other long term (current) drug therapy: Secondary | ICD-10-CM | POA: Diagnosis not present

## 2018-02-13 DIAGNOSIS — I10 Essential (primary) hypertension: Secondary | ICD-10-CM | POA: Diagnosis not present

## 2018-02-13 DIAGNOSIS — K219 Gastro-esophageal reflux disease without esophagitis: Secondary | ICD-10-CM | POA: Diagnosis not present

## 2018-02-13 DIAGNOSIS — R918 Other nonspecific abnormal finding of lung field: Secondary | ICD-10-CM

## 2018-02-13 MED ORDER — ACETAMINOPHEN-CODEINE #3 300-30 MG PO TABS: 1.0000 | ORAL_TABLET | ORAL | 0 refills | Status: DC | PRN

## 2018-02-13 NOTE — Patient Instructions (Signed)
-  It was a pleasure seeing you today -I will put you in for a CT scan of your chest for follow-up of your pulmonary nodule -I will refill your Tylenol 3 for your back pain -Please try to exercise and watch your diet -Please call me if you have any questions or need any refills -Have a great holiday season!

## 2018-02-13 NOTE — Progress Notes (Signed)
   Subjective:    Patient ID: [name], female    DOB: 06-Dec-1935, 82 y.o.   MRN: [id number]  HPI  I have seen and examined this patient.  Patient is here for routine follow-up of her hypertension and reflux.  Patient states that she feels well today with no new complaints.  She does have intermittent back pain and states that she ran out of her Tylenol 3.  This was last prescribed to her in July for 1 month supply.  Patient states that she did not want to call for refill as she did not want to "bother" me.  She has no other complaints at this time.  She states that she is compliant with all her medications.   Review of Systems  Constitutional: Negative.   HENT: Negative.   Respiratory: Positive for wheezing.        Patient complains of intermittent episodes of wheezing after exertion  Cardiovascular: Negative.   Gastrointestinal: Negative.   Musculoskeletal: Positive for arthralgias and back pain.  Neurological: Negative.   Psychiatric/Behavioral: Negative.        Objective:   Physical Exam  Constitutional: She is oriented to person, place, and time. She appears well-developed and well-nourished.  HENT:  Head: Normocephalic and atraumatic.  Mouth/Throat: No oropharyngeal exudate.  Neck: Neck supple.  Cardiovascular: Normal rate, regular rhythm and normal heart sounds.  Pulmonary/Chest: Effort normal and breath sounds normal. She has no wheezes. She has no rales.  Abdominal: Soft. Bowel sounds are normal. She exhibits no distension. There is no tenderness.  Musculoskeletal: Normal range of motion. She exhibits no edema.  Lymphadenopathy:    She has no cervical adenopathy.  Neurological: She is alert and oriented to person, place, and time.  Psychiatric: She has a normal mood and affect. Her behavior is normal.          Assessment & Plan:  Please see problem based charting for assessment and plan:

## 2018-02-14 NOTE — Assessment & Plan Note (Signed)
-  Patient said that her symptoms are well controlled with Protonix -She states that she would would like to come off this medication and this was discussed on her last visit -I asked the patient to complete her current bottle of pantoprazole and not refill another 1. -If she does get recurrent symptoms we can always try H2 blocker if she does not want to stay on the PPI

## 2018-02-14 NOTE — Assessment & Plan Note (Signed)
-  Patient was noted to have a 4 mm right middle lobe nodule on high-resolution CT done in December 2018 -Patient denies any weight loss or decreased appetite or night sweats or fevers -Given her history of breast cancer we will check a repeat CT chest to ensure no progression in size of the nodule -No further work-up at this time

## 2018-02-14 NOTE — Assessment & Plan Note (Signed)
-  Patient states that [facility] and that she gets imaging of her breasts every year -The last mammogram done in our records was in 2016 -We will discuss with the patient again at follow-up and consider getting a repeat mammogram done at that time

## 2018-02-14 NOTE — Assessment & Plan Note (Signed)
BP Readings from Last 3 Encounters:  02/13/18 (!) 167/86  11/14/17 (!) 143/90  10/10/17 (!) 138/94    Lab Results  Component Value Date   NA 143 11/14/2017   K 3.9 11/14/2017   CREATININE 0.95 11/14/2017    Assessment: Blood pressure control:  Uncontrolled Progress toward BP goal:   Deteriorated Comments: Patient states that she is compliant with losartan 100 mg  Plan: Medications:  continue current medications Educational resources provided:   Self management tools provided:   Other plans: Would consider adding amlodipine to her regimen on her follow-up visit if her blood pressure remains high

## 2018-02-14 NOTE — Assessment & Plan Note (Signed)
-  Patient states that she has not been following her diet over the holidays -She is up approximately 4 pounds since her last visit -I explained to the patient importance of following a good diet and exercising and attempting to lose weight -We will follow-up with her on her next visit and discuss this further

## 2018-02-14 NOTE — Assessment & Plan Note (Signed)
-  This problem is chronic and stable -Patient has intermittent episodes of back pain as well as joint pain especially her knees and her right shoulder -She was prescribed Tylenol 3 to take as needed.  This was last prescribed to her in July and she states that this helped decrease her pain when she has a flare. -We will refill this medication again for her today -I believe the benefits continue this medication outweigh the risks and allow the patient to participate in her daily activities

## 2018-02-21 DIAGNOSIS — H4052X2 Glaucoma secondary to other eye disorders, left eye, moderate stage: Secondary | ICD-10-CM | POA: Diagnosis not present

## 2018-02-21 DIAGNOSIS — H31092 Other chorioretinal scars, left eye: Secondary | ICD-10-CM | POA: Diagnosis not present

## 2018-02-21 DIAGNOSIS — H43392 Other vitreous opacities, left eye: Secondary | ICD-10-CM | POA: Diagnosis not present

## 2018-02-21 DIAGNOSIS — Z961 Presence of intraocular lens: Secondary | ICD-10-CM | POA: Diagnosis not present

## 2018-02-21 DIAGNOSIS — H34832 Tributary (branch) retinal vein occlusion, left eye, with macular edema: Secondary | ICD-10-CM | POA: Diagnosis not present

## 2018-02-21 DIAGNOSIS — H43813 Vitreous degeneration, bilateral: Secondary | ICD-10-CM | POA: Diagnosis not present

## 2018-03-08 ENCOUNTER — Other Ambulatory Visit: Payer: Self-pay | Admitting: Pharmacy Technician

## 2018-03-08 ENCOUNTER — Encounter: Payer: Self-pay | Admitting: Pharmacy Technician

## 2018-03-08 NOTE — Patient Outreach (Signed)
[place] [facility]) Care Management  03/08/2018  COLANDRA OHANIAN 30-Apr-1935 [id number]   Per inbasket message to Mannie Stabile, she states [employer] application for Proventil HFA had not been received via inter-office. Prepared patient portion of application to be mailed and provider copy to be inter-officed again.  Will follow up with patient in 5-7 business days to confirm application has been received.  Maud Deed Chana Bode [place] Certified Pharmacy Technician [place] Management Direct Dial:[phone]

## 2018-03-09 ENCOUNTER — Other Ambulatory Visit: Payer: Self-pay | Admitting: Internal Medicine

## 2018-03-09 DIAGNOSIS — I10 Essential (primary) hypertension: Secondary | ICD-10-CM

## 2018-03-09 NOTE — Telephone Encounter (Signed)
Next appt scheduled 05/15/18 with PCP. 

## 2018-03-16 ENCOUNTER — Ambulatory Visit (HOSPITAL_COMMUNITY)
Admission: RE | Admit: 2018-03-16 | Discharge: 2018-03-16 | Disposition: A | Discharge status: Home / Self Care | Payer: PPO | Source: Ambulatory Visit | Attending: Internal Medicine | Admitting: Internal Medicine

## 2018-03-16 DIAGNOSIS — R918 Other nonspecific abnormal finding of lung field: Secondary | ICD-10-CM | POA: Diagnosis not present

## 2018-03-16 DIAGNOSIS — R911 Solitary pulmonary nodule: Secondary | ICD-10-CM | POA: Diagnosis not present

## 2018-03-20 ENCOUNTER — Telehealth: Payer: Self-pay | Admitting: Internal Medicine

## 2018-03-20 NOTE — Telephone Encounter (Signed)
I called the patient to discuss the results of her CT scan with her. Pulmonary nodule was stable and there were no other concerning findings noted on the CT. No further work up at this time. Patient expresses understanding and is in agreement with plan.

## 2018-03-31 ENCOUNTER — Other Ambulatory Visit: Payer: Self-pay

## 2018-03-31 ENCOUNTER — Emergency Department (HOSPITAL_COMMUNITY): Payer: PPO

## 2018-03-31 ENCOUNTER — Observation Stay (HOSPITAL_COMMUNITY)
Admission: EM | Admit: 2018-03-31 | Discharge: 2018-04-02 | Disposition: A | Discharge status: Home / Self Care | Payer: PPO | Attending: Internal Medicine | Admitting: Internal Medicine

## 2018-03-31 ENCOUNTER — Encounter (HOSPITAL_COMMUNITY): Payer: Self-pay

## 2018-03-31 DIAGNOSIS — E876 Hypokalemia: Secondary | ICD-10-CM | POA: Diagnosis present

## 2018-03-31 DIAGNOSIS — M1990 Unspecified osteoarthritis, unspecified site: Secondary | ICD-10-CM | POA: Insufficient documentation

## 2018-03-31 DIAGNOSIS — I10 Essential (primary) hypertension: Secondary | ICD-10-CM | POA: Diagnosis present

## 2018-03-31 DIAGNOSIS — Z7982 Long term (current) use of aspirin: Secondary | ICD-10-CM | POA: Diagnosis not present

## 2018-03-31 DIAGNOSIS — R911 Solitary pulmonary nodule: Secondary | ICD-10-CM | POA: Insufficient documentation

## 2018-03-31 DIAGNOSIS — G8929 Other chronic pain: Secondary | ICD-10-CM | POA: Insufficient documentation

## 2018-03-31 DIAGNOSIS — E039 Hypothyroidism, unspecified: Secondary | ICD-10-CM | POA: Diagnosis present

## 2018-03-31 DIAGNOSIS — Z79899 Other long term (current) drug therapy: Secondary | ICD-10-CM | POA: Diagnosis not present

## 2018-03-31 DIAGNOSIS — M549 Dorsalgia, unspecified: Secondary | ICD-10-CM | POA: Diagnosis not present

## 2018-03-31 DIAGNOSIS — E785 Hyperlipidemia, unspecified: Secondary | ICD-10-CM | POA: Diagnosis not present

## 2018-03-31 DIAGNOSIS — Z6841 Body Mass Index (BMI) 40.0 and over, adult: Secondary | ICD-10-CM | POA: Insufficient documentation

## 2018-03-31 DIAGNOSIS — I700 Atherosclerosis of aorta: Secondary | ICD-10-CM | POA: Diagnosis not present

## 2018-03-31 DIAGNOSIS — R55 Syncope and collapse: Principal | ICD-10-CM | POA: Diagnosis present

## 2018-03-31 DIAGNOSIS — N179 Acute kidney failure, unspecified: Secondary | ICD-10-CM | POA: Insufficient documentation

## 2018-03-31 DIAGNOSIS — Z87891 Personal history of nicotine dependence: Secondary | ICD-10-CM | POA: Insufficient documentation

## 2018-03-31 DIAGNOSIS — K219 Gastro-esophageal reflux disease without esophagitis: Secondary | ICD-10-CM | POA: Diagnosis present

## 2018-03-31 DIAGNOSIS — I2510 Atherosclerotic heart disease of native coronary artery without angina pectoris: Secondary | ICD-10-CM | POA: Diagnosis present

## 2018-03-31 DIAGNOSIS — Z8249 Family history of ischemic heart disease and other diseases of the circulatory system: Secondary | ICD-10-CM | POA: Insufficient documentation

## 2018-03-31 DIAGNOSIS — R0902 Hypoxemia: Secondary | ICD-10-CM | POA: Diagnosis not present

## 2018-03-31 DIAGNOSIS — Z853 Personal history of malignant neoplasm of breast: Secondary | ICD-10-CM | POA: Diagnosis not present

## 2018-03-31 DIAGNOSIS — I872 Venous insufficiency (chronic) (peripheral): Secondary | ICD-10-CM | POA: Insufficient documentation

## 2018-03-31 DIAGNOSIS — R011 Cardiac murmur, unspecified: Secondary | ICD-10-CM | POA: Diagnosis present

## 2018-03-31 DIAGNOSIS — R4020 Unspecified coma: Secondary | ICD-10-CM | POA: Diagnosis not present

## 2018-03-31 DIAGNOSIS — J45909 Unspecified asthma, uncomplicated: Secondary | ICD-10-CM | POA: Diagnosis not present

## 2018-03-31 DIAGNOSIS — W19XXXA Unspecified fall, initial encounter: Secondary | ICD-10-CM | POA: Diagnosis not present

## 2018-03-31 LAB — CREATININE, SERUM
Creatinine, Ser: 1.52 mg/dL — ABNORMAL HIGH (ref 0.44–1.00)
GFR calc non Af Amer: 32 mL/min — ABNORMAL LOW (ref >60)
GFR, EST AFRICAN AMERICAN: 37 mL/min — AB (ref >60)

## 2018-03-31 LAB — CBC WITH DIFFERENTIAL/PLATELET
Abs Immature Granulocytes: 0.02 10*3/uL (ref 0.00–0.07)
Basophils Absolute: 0 10*3/uL (ref 0.0–0.1)
Basophils Relative: 0 %
Eosinophils Absolute: 0.3 10*3/uL (ref 0.0–0.5)
Eosinophils Relative: 3 %
HEMATOCRIT: 37.6 % (ref 36.0–46.0)
HEMOGLOBIN: 11.1 g/dL — AB (ref 12.0–15.0)
Immature Granulocytes: 0 %
LYMPHS PCT: 14 %
Lymphs Abs: 1.3 10*3/uL (ref 0.7–4.0)
MCH: 24.1 pg — ABNORMAL LOW (ref 26.0–34.0)
MCHC: 29.5 g/dL — ABNORMAL LOW (ref 30.0–36.0)
MCV: 81.7 fL (ref 80.0–100.0)
MONO ABS: 0.9 10*3/uL (ref 0.1–1.0)
Monocytes Relative: 10 %
Neutro Abs: 6.5 10*3/uL (ref 1.7–7.7)
Neutrophils Relative %: 73 %
Platelets: 245 10*3/uL (ref 150–400)
RBC: 4.6 MIL/uL (ref 3.87–5.11)
RDW: 14.8 % (ref 11.5–15.5)
WBC: 9 10*3/uL (ref 4.0–10.5)
nRBC: 0 % (ref 0.0–0.2)

## 2018-03-31 LAB — COMPREHENSIVE METABOLIC PANEL
ALK PHOS: 72 U/L (ref 38–126)
ALT: 13 U/L (ref 0–44)
ANION GAP: 9 (ref 5–15)
AST: 30 U/L (ref 15–41)
Albumin: 3.5 g/dL (ref 3.5–5.0)
BUN: 22 mg/dL (ref 8–23)
CO2: 24 mmol/L (ref 22–32)
Calcium: 9.3 mg/dL (ref 8.9–10.3)
Chloride: 104 mmol/L (ref 98–111)
Creatinine, Ser: 1.69 mg/dL — ABNORMAL HIGH (ref 0.44–1.00)
GFR calc Af Amer: 33 mL/min — ABNORMAL LOW (ref >60)
GFR calc non Af Amer: 28 mL/min — ABNORMAL LOW (ref >60)
Glucose, Bld: 120 mg/dL — ABNORMAL HIGH (ref 70–99)
Potassium: 3.4 mmol/L — ABNORMAL LOW (ref 3.5–5.1)
Sodium: 137 mmol/L (ref 135–145)
Total Bilirubin: 0.9 mg/dL (ref 0.3–1.2)
Total Protein: 7.2 g/dL (ref 6.5–8.1)

## 2018-03-31 LAB — CBC
HCT: 35.2 % — ABNORMAL LOW (ref 36.0–46.0)
Hemoglobin: 10.3 g/dL — ABNORMAL LOW (ref 12.0–15.0)
MCH: 23.9 pg — ABNORMAL LOW (ref 26.0–34.0)
MCHC: 29.3 g/dL — ABNORMAL LOW (ref 30.0–36.0)
MCV: 81.7 fL (ref 80.0–100.0)
Platelets: 245 10*3/uL (ref 150–400)
RBC: 4.31 MIL/uL (ref 3.87–5.11)
RDW: 14.8 % (ref 11.5–15.5)
WBC: 8.3 10*3/uL (ref 4.0–10.5)
nRBC: 0 % (ref 0.0–0.2)

## 2018-03-31 LAB — I-STAT TROPONIN, ED: Troponin i, poc: 0.01 ng/mL (ref 0.00–0.08)

## 2018-03-31 LAB — TSH: TSH: 2.801 u[IU]/mL (ref 0.350–4.500)

## 2018-03-31 MED ORDER — SODIUM CHLORIDE 0.9% FLUSH: 3.0000 mL | Freq: Two times a day (BID) | INTRAVENOUS | Status: DC

## 2018-03-31 MED ORDER — ACETAMINOPHEN 650 MG RE SUPP: 650.0000 mg | Freq: Four times a day (QID) | RECTAL | Status: DC | PRN

## 2018-03-31 MED ORDER — POTASSIUM CHLORIDE IN NACL 20-0.9 MEQ/L-% IV SOLN
INTRAVENOUS | Status: AC
  Administered 2018-03-31 – 2018-04-01 (×2): via INTRAVENOUS
  Filled 2018-03-31 (×5): qty 1000

## 2018-03-31 MED ORDER — ONDANSETRON HCL 4 MG PO TABS: 4.0000 mg | ORAL_TABLET | Freq: Four times a day (QID) | ORAL | Status: DC | PRN

## 2018-03-31 MED ORDER — ACETAMINOPHEN 325 MG PO TABS
650.0000 mg | ORAL_TABLET | Freq: Four times a day (QID) | ORAL | Status: DC | PRN
  Filled 2018-03-31: qty 2

## 2018-03-31 MED ORDER — OXYCODONE HCL 5 MG PO TABS
5.0000 mg | ORAL_TABLET | ORAL | Status: DC | PRN
  Administered 2018-03-31 – 2018-04-02 (×4): 5 mg via ORAL
  Filled 2018-03-31 (×4): qty 1

## 2018-03-31 MED ORDER — ENOXAPARIN SODIUM 40 MG/0.4ML ~~LOC~~ SOLN
40.0000 mg | SUBCUTANEOUS | Status: DC
  Administered 2018-03-31 – 2018-04-01 (×2): 40 mg via SUBCUTANEOUS
  Filled 2018-03-31 (×2): qty 0.4

## 2018-03-31 MED ORDER — SODIUM CHLORIDE 0.9 % IV SOLN: 250.0000 mL | INTRAVENOUS | Status: DC | PRN

## 2018-03-31 MED ORDER — ONDANSETRON HCL 4 MG/2ML IJ SOLN: 4.0000 mg | Freq: Four times a day (QID) | INTRAMUSCULAR | Status: DC | PRN

## 2018-03-31 MED ORDER — POLYETHYLENE GLYCOL 3350 17 G PO PACK: 17.0000 g | PACK | Freq: Every day | ORAL | Status: DC | PRN

## 2018-03-31 MED ORDER — ALUM & MAG HYDROXIDE-SIMETH 200-200-20 MG/5ML PO SUSP: 30.0000 mL | Freq: Four times a day (QID) | ORAL | Status: DC | PRN

## 2018-03-31 MED ORDER — SODIUM CHLORIDE 0.9% FLUSH: 3.0000 mL | INTRAVENOUS | Status: DC | PRN

## 2018-03-31 MED ORDER — POTASSIUM CHLORIDE CRYS ER 20 MEQ PO TBCR
20.0000 meq | EXTENDED_RELEASE_TABLET | Freq: Once | ORAL | Status: AC
  Administered 2018-03-31: 20 meq via ORAL
  Filled 2018-03-31: qty 1

## 2018-03-31 MED ORDER — TRAZODONE HCL 50 MG PO TABS
25.0000 mg | ORAL_TABLET | Freq: Every evening | ORAL | Status: DC | PRN
  Administered 2018-04-01 (×2): 25 mg via ORAL
  Filled 2018-03-31 (×2): qty 1

## 2018-03-31 NOTE — ED Triage Notes (Addendum)
Pt BIB [name]. Pt had an unwitnessed syncopal episode at home. EMS arrived on scene to find pt 88% on room air and placed her on 2L [place]. Pt is 97% on room air here. Pt complains of lower back pain over her right hip which pt reports is chronic. Pt also complains of shortness of breath with exertion at this time. Denies any other pain.

## 2018-03-31 NOTE — H&P (Signed)
History and Physical    [name] CNO:[id number] DOB: 10/26/1935 DOA: 03/31/2018  PCP: Aldine Contes, MD   Patient coming from: Home  I have personally briefly reviewed patient's old medical records in [place]  Chief Complaint: Syncope  HPI: [name] is a 83 y.o. female with medical history significant of [place] artery calcifications, gastroesophageal reflux disease, hypothyroidism, hypertension, hyperlipidemia, osteoarthritis, venous insufficiency, and unspecified heart murmur who presents the emergency department complaining of syncopal episode today.  Was in her usual state of health she was sitting down and started feeling kind of warm and got diaphoretic she decided stand up and go to the bedroom and lie down.  When she stood up she passed out and this was witnessed by her daughter who was present at the time.  She had no associated chest pain and no shortness of breath.  Syncope lasted a short period of time and the patient quickly came to.  She has no focal deficits and denies any memory problems.  Had no headache, no chest pain, no neck pain, no shortness of breath, no heart racing, she has no injuries from her fall.  He awoke on the floor seconds later.  ED Course: EKG shows sinus rhythm with borderline interventricular conduction delay, chest x-ray shows no acute cardiopulmonary abnormality, potassium is low at 3.4, T scan of the chest obtained January 10 shows no change in pulmonary nodule.  Coronary calcifications as well as atherosclerosis of the aorta.  Review of Systems: As per HPI otherwise all other systems reviewed and  negative.    Past Medical History:  Diagnosis Date  . Allergic rhinitis, cause unspecified   . Asthma   . Cancer ([place])   . Chronic back pain   . Coronary artery calcification seen on CAT scan 04/21/2017  . Enlarged pulmonary artery ([place]) 04/21/2017   By chest CT  . GERD (gastroesophageal reflux disease) 05/19/2011  . history of Bilateral leg  edema-likely amlodipine induced. 07/28/2011  . history of CAP (community acquired pneumonia) 07/14/2011  . history of HYPOTHYROIDISM, BORDERLINE 03/20/2006   Annotation: Asymptomatic, untreated Qualifier: Diagnosis of  By: Tomasa Hosteller MD, Edmon Crape.   . Hx of breast cancer   . Hyperlipidemia   . Hypertension   . Hypothyroidism   . Lumbago   . OBESITY NOS 03/20/2006   Qualifier: Diagnosis of  By: Tomasa Hosteller MD, [place] 12/13/2005   Annotation: bilateral knees;right knee injection 6/05 Qualifier: Diagnosis of  By: Tomasa Hosteller MD, Veronique D.   . right shoulder pain, likely Impingement syndrome  09/09/2010  . VENOUS INSUFFICIENCY, CHRONIC 03/20/2006   Qualifier: Diagnosis of  By: Tomasa Hosteller MD, Edmon Crape.     Past Surgical History:  Procedure Laterality Date  . ABDOMINAL HYSTERECTOMY    . BREAST LUMPECTOMY    . HERNIA REPAIR    . HIP CLOSED REDUCTION Right 11/26/2014   Procedure: CLOSED REDUCTIION RIGHT HIP;  Surgeon: Newt Minion, MD;  Location: [facility];  Service: Orthopedics;  Laterality: Right;  . INCISIONAL HERNIA REPAIR N/A 10/29/2013   Procedure: OPEN REPAIR OF RECURRENT INCISIONAL HERNIA WITH MESH;  Surgeon: Zenovia Jarred, MD;  Location: [facility];  Service: General;  Laterality: N/A;  . TOTAL HIP ARTHROPLASTY      Social History   Social History Narrative   Lives with daughter.      reports that she quit smoking about 59 years ago. Her smoking use included cigarettes. She has a 0.13 pack-year  smoking history. She has never used smokeless tobacco. She reports that she does not drink alcohol or use drugs.  Allergies  Allergen Reactions  . Acyclovir And Related   . Metoprolol Itching  . Food Rash and Other (See Comments)    Pt states that she is allergic to pickles.      Family History  Problem Relation Age of Onset  . Hypertension Mother   . Alzheimer's disease Mother   . Diabetes Sister      Prior to Admission medications   Medication Sig Start Date  End Date Taking? Authorizing Provider  albuterol (PROAIR HFA) 108 (90 Base) MCG/ACT inhaler Inhale 2 puffs into the lungs every 4 (four) hours as needed for wheezing or shortness of breath. 09/05/17  Yes Aldine Contes, MD  atorvastatin (LIPITOR) 40 MG tablet TAKE 1 TABLET BY MOUTH ONCE DAILY 02/07/18  Yes Aldine Contes, MD  calcium citrate-vitamin D (CITRACAL+D) 315-200 MG-UNIT tablet Take 1 tablet by mouth 2 (two) times daily. 11/11/15  Yes Aldine Contes, MD  acetaminophen-codeine (TYLENOL #3) 300-30 MG tablet Take 1 tablet by mouth every 4 (four) hours as needed for moderate pain. 02/13/18   Aldine Contes, MD  carvedilol (COREG) 25 MG tablet TAKE 1 TABLET BY MOUTH TWICE DAILY WITH A MEAL 03/09/18   Aldine Contes, MD  Cyanocobalamin (VITAMIN B12 PO) Take 1 tablet daily by mouth.    [provider]  diclofenac sodium (VOLTAREN) 1 % GEL Apply 2 g topically as needed. 09/05/17   Aldine Contes, MD  losartan (COZAAR) 100 MG tablet TAKE 1 TABLET BY MOUTH ONCE DAILY 03/09/18   Aldine Contes, MD  pantoprazole (PROTONIX) 40 MG tablet Take 1 tablet (40 mg total) by mouth daily. 05/30/17   Aldine Contes, MD    Physical Exam:  Constitutional: NAD, calm, comfortable Vitals:   03/31/18 1301 03/31/18 1315 03/31/18 1400 03/31/18 1430  BP: (!) 150/66 131/80 122/84 (!) 141/124  Pulse: 71 70 66 73  Resp: 16 20 (!) 21 18  Temp: 98.1 F (36.7 C)     TempSrc: Oral     SpO2: 96% 97% 95% 98%  Weight:      Height:       Eyes: PERRL, lids and conjunctivae normal ENMT: Mucous membranes are moist. Posterior pharynx clear of any exudate or lesions.Normal dentition.  Neck: normal, supple, no masses, no thyromegaly Respiratory: clear to auscultation bilaterally, no wheezing, no crackles. Normal respiratory effort. No accessory muscle use.  Cardiovascular: Regular rate and rhythm, no rubs / gallops.  A fairly loud 3/6 to 4/6 systolic ejection murmur heard best at the base.  No extremity  edema. 2+ pedal pulses. No carotid bruits.  Abdomen: no tenderness, no masses palpated. No hepatosplenomegaly. Bowel sounds positive.  Musculoskeletal: no clubbing / cyanosis. No joint deformity upper and lower extremities. Good ROM, no contractures. Normal muscle tone.  Skin: no rashes, lesions, ulcers. No induration Neurologic: CN 2-12 grossly intact. Sensation intact, DTR normal. Strength 5/5 in all 4.  Psychiatric: Normal judgment and insight. Alert and oriented x 3. Normal mood.    Labs on Admission: I have personally reviewed following labs and imaging studies  CBC: Recent Labs  Lab 03/31/18 1443  WBC 9.0  NEUTROABS 6.5  HGB 11.1*  HCT 37.6  MCV 81.7  PLT 973   Basic Metabolic Panel: Recent Labs  Lab 03/31/18 1443  NA 137  K 3.4*  CL 104  CO2 24  GLUCOSE 120*  BUN 22  CREATININE 1.69*  CALCIUM 9.3   GFR: Estimated Creatinine Clearance: 31.5 mL/min (A) (by C-G formula based on SCr of 1.69 mg/dL (H)). Liver Function Tests: Recent Labs  Lab 03/31/18 1443  AST 30  ALT 13  ALKPHOS 72  BILITOT 0.9  PROT 7.2  ALBUMIN 3.5   No results for input(s): LIPASE, AMYLASE in the last 168 hours. No results for input(s): AMMONIA in the last 168 hours. Coagulation Profile: No results for input(s): INR, PROTIME in the last 168 hours. Cardiac Enzymes: No results for input(s): CKTOTAL, CKMB, CKMBINDEX, TROPONINI in the last 168 hours. BNP (last 3 results) No results for input(s): PROBNP in the last 8760 hours. HbA1C: No results for input(s): HGBA1C in the last 72 hours. CBG: No results for input(s): GLUCAP in the last 168 hours. Lipid Profile: No results for input(s): CHOL, HDL, LDLCALC, TRIG, CHOLHDL, LDLDIRECT in the last 72 hours. Thyroid Function Tests: No results for input(s): TSH, T4TOTAL, FREET4, T3FREE, THYROIDAB in the last 72 hours. Anemia Panel: No results for input(s): VITAMINB12, FOLATE, FERRITIN, TIBC, IRON, RETICCTPCT in the last 72 hours. Urine  analysis:    Component Value Date/Time   COLORURINE YELLOW 11/13/2013 1501   APPEARANCEUR Clear 11/14/2017 1014   LABSPEC 1.018 11/13/2013 1501   PHURINE 8.0 11/13/2013 1501   GLUCOSEU Negative 11/14/2017 1014   GLUCOSEU NEG mg/dL 12/10/2007 2002   HGBUR NEG 11/13/2013 1501   BILIRUBINUR Negative 11/14/2017 1014   KETONESUR NEG 11/13/2013 1501   PROTEINUR Negative 11/14/2017 1014   PROTEINUR NEG 11/13/2013 1501   UROBILINOGEN 1 11/13/2013 1501   NITRITE Negative 11/14/2017 1014   NITRITE NEG 11/13/2013 1501   LEUKOCYTESUR Negative 11/14/2017 1014    Radiological Exams on Admission: Dg Chest 2 View  Result Date: 03/31/2018 CLINICAL DATA:  Syncopal episode. EXAM: CHEST - 2 VIEW COMPARISON:  Body CT 03/16/2018 FINDINGS: Stable enlarged cardiac silhouette. Calcific atherosclerotic disease of the aorta. Chronic eventration of the left hemidiaphragm. There is no evidence of focal airspace consolidation, pleural effusion or pneumothorax. Osseous structures are without acute abnormality. Soft tissues are grossly normal. IMPRESSION: No active cardiopulmonary disease. Stably enlarged cardiac silhouette. Electronically Signed   By: Fidela Salisbury M.D.   On: 03/31/2018 15:14    EKG: Independently reviewed.  Sinus rhythm with borderline interventricular conduction delay and abnormal R wave progression otherwise unremarkable  Assessment/Plan Principal Problem:   Syncope and collapse Active Problems:   Murmur, cardiac   Coronary artery calcification seen on CAT scan   Hypothyroidism   Hyperlipidemia   Essential hypertension   GERD (gastroesophageal reflux disease)    1.  Syncope and collapse: Possibly vasovagal event.  Family reports that her orthostatics were negative.  She does appear to be a little bit dehydrated based on her renal function.  We will hydrate her overnight.  Examination concerning for possible aortic stenosis.  Review of prior echocardiograms did not show any  abnormalities of her aortic valve.  I am going to check an echocardiogram.  We will also check carotid Dopplers, and EKG in a.m.  2.  Cardiac murmur echocardiogram as above.  3.  Coronary artery calcification is seen on CT scan: Patient without any episodes of chest pain do not think that this is an anginal equivalent.  4.  Hypothyroidism: We will check a TSH.  5.  Hyperlipidemia continue home medications.  7.  Essential hypertension continue home medications.  8.  Gastroesophageal reflux disease: Continue home medication.  DVT prophylaxis: Lovenox Code Status: Full code Family Communication: Patient's  son, daughter-in-law, and granddaughter were all present during admission.  All questions answered.  Patient retains capacity. Disposition Plan: Likely home once testing completed. Consults called: None Admission status: Observation   Lady Deutscher MD FACP [facility] Pager [phone]  If 7PM-7AM, please contact night-coverage [email or web address] Password [facility]  03/31/2018, 4:41 PM

## 2018-03-31 NOTE — ED Provider Notes (Signed)
[facility] EMERGENCY DEPARTMENT Provider Note   CSN: [id number] Arrival date & time: 03/31/18  1249     History   Chief Complaint Chief Complaint  Patient presents with  . Loss of Consciousness    [name] is a 83 y.o. female.  HPI   Started sweating, feeling hot while sitting in the chair, got up to go lay down in bed Stood up then woke up on the floor Sister witnessed the episode Was brief episode of LOC No chest pain or shortness of breath No heart racing Denies injury from the fall, bruise on arm Not sure if she hit her head Not having headache, no neck pain No nausea, vomiting, cough, fever, no black or bloody stools Was in normal state of health, no other concerns until this episode Occurred around 12PM  No hx of DVT/PE, breast cancer remote  Past Medical History:  Diagnosis Date  . Allergic rhinitis, cause unspecified   . Asthma   . Cancer ([place])   . Chronic back pain   . Coronary artery calcification seen on CAT scan 04/21/2017  . Enlarged pulmonary artery ([place]) 04/21/2017   By chest CT  . GERD (gastroesophageal reflux disease) 05/19/2011  . history of Bilateral leg edema-likely amlodipine induced. 07/28/2011  . history of CAP (community acquired pneumonia) 07/14/2011  . history of HYPOTHYROIDISM, BORDERLINE 03/20/2006   Annotation: Asymptomatic, untreated Qualifier: Diagnosis of  By: Tomasa Hosteller MD, Edmon Crape.   . Hx of breast cancer   . Hyperlipidemia   . Hypertension   . Hypothyroidism   . Lumbago   . OBESITY NOS 03/20/2006   Qualifier: Diagnosis of  By: Tomasa Hosteller MD, [place] 12/13/2005   Annotation: bilateral knees;right knee injection 6/05 Qualifier: Diagnosis of  By: Tomasa Hosteller MD, Veronique D.   . right shoulder pain, likely Impingement syndrome  09/09/2010  . VENOUS INSUFFICIENCY, CHRONIC 03/20/2006   Qualifier: Diagnosis of  By: Tomasa Hosteller MD, Edmon Crape.     Patient Active Problem List   Diagnosis Date  Noted  . Syncope and collapse 03/31/2018  . Syncope 03/31/2018  . Frequent urination 11/14/2017  . Pulmonary nodule 05/30/2017  . Coronary artery calcification seen on CAT scan 04/21/2017  . Enlarged pulmonary artery ([place]) 04/21/2017  . Restrictive lung disease 11/22/2016  . Murmur, cardiac 11/22/2016  . Preventative health care 09/04/2014  . Borderline diabetes 09/19/2013  . Hypertensive retinopathy 03/29/2013  . Hypokalemia 06/27/2011  . GERD (gastroesophageal reflux disease) 05/19/2011  . Hyperlipidemia 05/02/2007  . ALLERGIC RHINITIS 09/06/2006  . Hypothyroidism 03/20/2006  . Severe obesity (BMI >= 40) ([place]) 03/20/2006  . Chronic venous insufficiency 03/20/2006  . Right-sided low back pain with right-sided sciatica 03/20/2006  . BREAST CANCER, HX OF 03/20/2006  . Essential hypertension 12/13/2005  . Osteoarthritis 12/13/2005    Past Surgical History:  Procedure Laterality Date  . ABDOMINAL HYSTERECTOMY    . BREAST LUMPECTOMY    . HERNIA REPAIR    . HIP CLOSED REDUCTION Right 11/26/2014   Procedure: CLOSED REDUCTIION RIGHT HIP;  Surgeon: Newt Minion, MD;  Location: [facility];  Service: Orthopedics;  Laterality: Right;  . INCISIONAL HERNIA REPAIR N/A 10/29/2013   Procedure: OPEN REPAIR OF RECURRENT INCISIONAL HERNIA WITH MESH;  Surgeon: Zenovia Jarred, MD;  Location: [facility];  Service: General;  Laterality: N/A;  . TOTAL HIP ARTHROPLASTY       OB History   No obstetric history on file.  Home Medications    Prior to Admission medications   Medication Sig Start Date End Date Taking? Authorizing Provider  albuterol (PROAIR HFA) 108 (90 Base) MCG/ACT inhaler Inhale 2 puffs into the lungs every 4 (four) hours as needed for wheezing or shortness of breath. 09/05/17  Yes Aldine Contes, MD  aspirin EC 81 MG tablet Take 81 mg by mouth daily.   Yes [provider]  atorvastatin (LIPITOR) 40 MG tablet TAKE 1 TABLET BY MOUTH ONCE DAILY Patient taking differently:  Take 40 mg by mouth daily.  02/07/18  Yes Aldine Contes, MD  calcium citrate-vitamin D (CITRACAL+D) 315-200 MG-UNIT tablet Take 1 tablet by mouth 2 (two) times daily. 11/11/15  Yes Aldine Contes, MD  carvedilol (COREG) 25 MG tablet TAKE 1 TABLET BY MOUTH TWICE DAILY WITH A MEAL Patient taking differently: Take 25 mg by mouth 2 (two) times daily.  03/09/18  Yes Aldine Contes, MD  Cyanocobalamin (VITAMIN B12 PO) Take 1 tablet daily by mouth.   Yes [provider]  diclofenac sodium (VOLTAREN) 1 % GEL Apply 2 g topically as needed. Patient taking differently: Apply 2 g topically as needed (joint pain).  09/05/17  Yes Aldine Contes, MD  losartan (COZAAR) 100 MG tablet TAKE 1 TABLET BY MOUTH ONCE DAILY Patient taking differently: Take 100 mg by mouth daily.  03/09/18  Yes Aldine Contes, MD  pantoprazole (PROTONIX) 40 MG tablet Take 1 tablet (40 mg total) by mouth daily. 05/30/17  Yes Aldine Contes, MD  acetaminophen-codeine (TYLENOL #3) 300-30 MG tablet Take 1 tablet by mouth every 4 (four) hours as needed for moderate pain. Patient not taking: Reported on 03/31/2018 02/13/18   Aldine Contes, MD    Family History Family History  Problem Relation Age of Onset  . Hypertension Mother   . Alzheimer's disease Mother   . Diabetes Sister     Social History Social History   Tobacco Use  . Smoking status: Former Smoker    Packs/day: 0.25    Years: 0.50    Pack years: 0.12    Types: Cigarettes    Last attempt to quit: 09/09/1958    Years since quitting: 59.5  . Smokeless tobacco: Never Used  . Tobacco comment: social smoker  Substance Use Topics  . Alcohol use: No    Alcohol/week: 0.0 standard drinks  . Drug use: No     Allergies   Acyclovir and related; Metoprolol; and Food   Review of Systems Review of Systems  Constitutional: Positive for diaphoresis. Negative for fever.  HENT: Negative for sore throat.   Eyes: Negative for visual disturbance.    Respiratory: Negative for cough and shortness of breath.   Cardiovascular: Negative for chest pain and leg swelling.  Gastrointestinal: Negative for abdominal pain, nausea and vomiting.  Genitourinary: Negative for difficulty urinating and dysuria.  Musculoskeletal: Negative for back pain and neck pain.  Skin: Negative for rash.  Neurological: Negative for dizziness, syncope, weakness, light-headedness and headaches.     Physical Exam Updated Vital Signs BP (!) 159/69 (BP Location: Left Arm)   Pulse 74   Temp 98.5 F (36.9 C) (Oral)   Resp 18   Ht 5\' 2"  (1.575 m)   Wt 112.5 kg   SpO2 96%   BMI 45.36 kg/m   Physical Exam Vitals signs and nursing note reviewed.  Constitutional:      General: She is not in acute distress.    Appearance: She is well-developed. She is not diaphoretic.  HENT:  Head: Normocephalic and atraumatic.     Comments: No hemotympanum, no raccoons eyes or battle signs    Right Ear: Tympanic membrane normal.     Left Ear: Tympanic membrane normal.  Eyes:     Conjunctiva/sclera: Conjunctivae normal.  Neck:     Musculoskeletal: Normal range of motion.  Cardiovascular:     Rate and Rhythm: Normal rate and regular rhythm.     Heart sounds: Normal heart sounds. No murmur. No friction rub. No gallop.   Pulmonary:     Effort: Pulmonary effort is normal. No respiratory distress.     Breath sounds: Normal breath sounds. No wheezing or rales.  Abdominal:     General: There is no distension.     Palpations: Abdomen is soft.     Tenderness: There is no abdominal tenderness. There is no guarding.  Musculoskeletal:        General: No tenderness.  Skin:    General: Skin is warm and dry.     Findings: No erythema or rash.  Neurological:     Mental Status: She is alert and oriented to person, place, and time.      ED Treatments / Results  Labs (all labs ordered are listed, but only abnormal results are displayed) Labs Reviewed  CBC WITH  DIFFERENTIAL/PLATELET - Abnormal; Notable for the following components:      Result Value   Hemoglobin 11.1 (*)    MCH 24.1 (*)    MCHC 29.5 (*)    All other components within normal limits  COMPREHENSIVE METABOLIC PANEL - Abnormal; Notable for the following components:   Potassium 3.4 (*)    Glucose, Bld 120 (*)    Creatinine, Ser 1.69 (*)    GFR calc non Af Amer 28 (*)    GFR calc Af Amer 33 (*)    All other components within normal limits  CBC - Abnormal; Notable for the following components:   Hemoglobin 10.3 (*)    HCT 35.2 (*)    MCH 23.9 (*)    MCHC 29.3 (*)    All other components within normal limits  CREATININE, SERUM - Abnormal; Notable for the following components:   Creatinine, Ser 1.52 (*)    GFR calc non Af Amer 32 (*)    GFR calc Af Amer 37 (*)    All other components within normal limits  TSH  BASIC METABOLIC PANEL  CBC  I-STAT TROPONIN, ED    EKG EKG Interpretation  Date/Time:  Saturday March 31 2018 13:03:42 EST Ventricular Rate:  69 PR Interval:    QRS Duration: 114 QT Interval:  426 QTC Calculation: 457 R Axis:   14 Text Interpretation:  Sinus rhythm Borderline intraventricular conduction delay Abnormal R-wave progression, early transition Baseline wander in lead(s) I III aVL No significant change since last tracing Confirmed by Gareth Morgan [phone]) on 03/31/2018 1:21:03 PM   Radiology Dg Chest 2 View  Result Date: 03/31/2018 CLINICAL DATA:  Syncopal episode. EXAM: CHEST - 2 VIEW COMPARISON:  Body CT 03/16/2018 FINDINGS: Stable enlarged cardiac silhouette. Calcific atherosclerotic disease of the aorta. Chronic eventration of the left hemidiaphragm. There is no evidence of focal airspace consolidation, pleural effusion or pneumothorax. Osseous structures are without acute abnormality. Soft tissues are grossly normal. IMPRESSION: No active cardiopulmonary disease. Stably enlarged cardiac silhouette. Electronically Signed   By: Fidela Salisbury  M.D.   On: 03/31/2018 15:14    Procedures Procedures (including critical care time)  Medications Ordered in ED Medications  enoxaparin (LOVENOX) injection 40 mg (40 mg Subcutaneous Given 03/31/18 1816)  0.9 % NaCl with KCl 20 mEq/ L  infusion ( Intravenous New Bag/Given 03/31/18 1819)  acetaminophen (TYLENOL) tablet 650 mg (has no administration in time range)    Or  acetaminophen (TYLENOL) suppository 650 mg (has no administration in time range)  oxyCODONE (Oxy IR/ROXICODONE) immediate release tablet 5 mg (has no administration in time range)  traZODone (DESYREL) tablet 25 mg (has no administration in time range)  polyethylene glycol (MIRALAX / GLYCOLAX) packet 17 g (has no administration in time range)  ondansetron (ZOFRAN) tablet 4 mg (has no administration in time range)    Or  ondansetron (ZOFRAN) injection 4 mg (has no administration in time range)  alum & mag hydroxide-simeth (MAALOX/MYLANTA) 200-200-20 MG/5ML suspension 30 mL (has no administration in time range)  potassium chloride SA (K-DUR,KLOR-CON) CR tablet 20 mEq (20 mEq Oral Given 03/31/18 1816)     Initial Impression / Assessment and Plan / ED Course  I have reviewed the triage vital signs and the nursing notes.  Pertinent labs & imaging results that were available during my care of the patient were reviewed by me and considered in my medical decision making (see chart for details).     83yo female with history above presents with concern for episode of diaphoresis followed by syncope.   EKG evaluated by me and shows sinus rhythm with no sign of prolonged QTc, no brugada, no sign of HOCM, no ST abnormalities and is similar to prior. No significant electrolyte abnormality or anemia to suggest cause of syncope. No neurologic symptoms, doubt CVA. No dyspnea, no tachycardia, no hypoxia in the ED and doubt PE. No chest pain, doubt ACS and troponin negative.  Pt with mild AKI but denies significant change in po intake, overall  no signs of dehydration as cause of syncope.  No history to suggest GI bleed. Denies injuries from fall, has no headache, not on anticoagulation, no sign of head trauma and doubt intracranial bleed at this time.  Concern for possible arrhythmia given preceding diaphoresis without other clear cause of syncope.  Will admit for further care.   Final Clinical Impressions(s) / ED Diagnoses   Final diagnoses:  Syncope, unspecified syncope type    ED Discharge Orders    None       Gareth Morgan, MD 03/31/18 2100

## 2018-04-01 ENCOUNTER — Observation Stay (HOSPITAL_BASED_OUTPATIENT_CLINIC_OR_DEPARTMENT_OTHER): Payer: PPO

## 2018-04-01 DIAGNOSIS — R55 Syncope and collapse: Secondary | ICD-10-CM

## 2018-04-01 DIAGNOSIS — K219 Gastro-esophageal reflux disease without esophagitis: Secondary | ICD-10-CM | POA: Diagnosis not present

## 2018-04-01 DIAGNOSIS — R011 Cardiac murmur, unspecified: Secondary | ICD-10-CM | POA: Diagnosis not present

## 2018-04-01 DIAGNOSIS — E039 Hypothyroidism, unspecified: Secondary | ICD-10-CM | POA: Diagnosis not present

## 2018-04-01 DIAGNOSIS — I2510 Atherosclerotic heart disease of native coronary artery without angina pectoris: Secondary | ICD-10-CM | POA: Diagnosis not present

## 2018-04-01 DIAGNOSIS — Z87891 Personal history of nicotine dependence: Secondary | ICD-10-CM | POA: Diagnosis not present

## 2018-04-01 DIAGNOSIS — E785 Hyperlipidemia, unspecified: Secondary | ICD-10-CM | POA: Diagnosis not present

## 2018-04-01 DIAGNOSIS — Z8249 Family history of ischemic heart disease and other diseases of the circulatory system: Secondary | ICD-10-CM | POA: Diagnosis not present

## 2018-04-01 DIAGNOSIS — M1990 Unspecified osteoarthritis, unspecified site: Secondary | ICD-10-CM | POA: Diagnosis not present

## 2018-04-01 DIAGNOSIS — I700 Atherosclerosis of aorta: Secondary | ICD-10-CM | POA: Diagnosis not present

## 2018-04-01 DIAGNOSIS — I10 Essential (primary) hypertension: Secondary | ICD-10-CM | POA: Diagnosis not present

## 2018-04-01 DIAGNOSIS — Z79899 Other long term (current) drug therapy: Secondary | ICD-10-CM | POA: Diagnosis not present

## 2018-04-01 LAB — CBC
HEMATOCRIT: 31.6 % — AB (ref 36.0–46.0)
Hemoglobin: 9.8 g/dL — ABNORMAL LOW (ref 12.0–15.0)
MCH: 24.8 pg — ABNORMAL LOW (ref 26.0–34.0)
MCHC: 31 g/dL (ref 30.0–36.0)
MCV: 80 fL (ref 80.0–100.0)
Platelets: 226 10*3/uL (ref 150–400)
RBC: 3.95 MIL/uL (ref 3.87–5.11)
RDW: 14.9 % (ref 11.5–15.5)
WBC: 7.6 10*3/uL (ref 4.0–10.5)
nRBC: 0 % (ref 0.0–0.2)

## 2018-04-01 LAB — BASIC METABOLIC PANEL
Anion gap: 10 (ref 5–15)
BUN: 18 mg/dL (ref 8–23)
CHLORIDE: 105 mmol/L (ref 98–111)
CO2: 23 mmol/L (ref 22–32)
Calcium: 8.8 mg/dL — ABNORMAL LOW (ref 8.9–10.3)
Creatinine, Ser: 1.35 mg/dL — ABNORMAL HIGH (ref 0.44–1.00)
GFR calc Af Amer: 43 mL/min — ABNORMAL LOW (ref >60)
GFR calc non Af Amer: 37 mL/min — ABNORMAL LOW (ref >60)
GLUCOSE: 136 mg/dL — AB (ref 70–99)
Potassium: 3.4 mmol/L — ABNORMAL LOW (ref 3.5–5.1)
Sodium: 138 mmol/L (ref 135–145)

## 2018-04-01 LAB — ECHOCARDIOGRAM COMPLETE
Height: 62 in
Weight: 3943.59 oz

## 2018-04-01 LAB — MAGNESIUM: Magnesium: 1.7 mg/dL (ref 1.7–2.4)

## 2018-04-01 MED ORDER — ASPIRIN EC 81 MG PO TBEC
81.0000 mg | DELAYED_RELEASE_TABLET | Freq: Every day | ORAL | Status: DC
  Administered 2018-04-01 – 2018-04-02 (×2): 81 mg via ORAL
  Filled 2018-04-01 (×2): qty 1

## 2018-04-01 MED ORDER — AMLODIPINE BESYLATE 10 MG PO TABS
10.0000 mg | ORAL_TABLET | Freq: Every day | ORAL | Status: DC
  Administered 2018-04-01 – 2018-04-02 (×2): 10 mg via ORAL
  Filled 2018-04-01 (×2): qty 1

## 2018-04-01 MED ORDER — ATORVASTATIN CALCIUM 40 MG PO TABS
40.0000 mg | ORAL_TABLET | Freq: Every day | ORAL | Status: DC
  Administered 2018-04-01 – 2018-04-02 (×2): 40 mg via ORAL
  Filled 2018-04-01 (×2): qty 1

## 2018-04-01 MED ORDER — PERFLUTREN LIPID MICROSPHERE
1.0000 mL | INTRAVENOUS | Status: AC | PRN
  Administered 2018-04-01: 4 mL via INTRAVENOUS
  Filled 2018-04-01: qty 10

## 2018-04-01 MED ORDER — HYDRALAZINE HCL 20 MG/ML IJ SOLN
10.0000 mg | Freq: Once | INTRAMUSCULAR | Status: AC
  Administered 2018-04-01: 10 mg via INTRAVENOUS
  Filled 2018-04-01: qty 1

## 2018-04-01 MED ORDER — LOSARTAN POTASSIUM 50 MG PO TABS: 25.0000 mg | ORAL_TABLET | Freq: Every day | ORAL | Status: DC

## 2018-04-01 MED ORDER — POTASSIUM CHLORIDE CRYS ER 20 MEQ PO TBCR
40.0000 meq | EXTENDED_RELEASE_TABLET | Freq: Once | ORAL | Status: AC
  Administered 2018-04-01: 40 meq via ORAL
  Filled 2018-04-01: qty 2

## 2018-04-01 MED ORDER — CARVEDILOL 25 MG PO TABS
25.0000 mg | ORAL_TABLET | Freq: Two times a day (BID) | ORAL | Status: DC
  Administered 2018-04-01 – 2018-04-02 (×3): 25 mg via ORAL
  Filled 2018-04-01 (×3): qty 1

## 2018-04-01 MED ORDER — PANTOPRAZOLE SODIUM 40 MG PO TBEC
40.0000 mg | DELAYED_RELEASE_TABLET | Freq: Every day | ORAL | Status: DC
  Administered 2018-04-01 – 2018-04-02 (×2): 40 mg via ORAL
  Filled 2018-04-01 (×2): qty 1

## 2018-04-01 NOTE — Progress Notes (Addendum)
PROGRESS NOTE                                                                                                                                                                                                             Patient Demographics:    [name], is a 83 y.o. female, DOB - [birth year], ZTI:[id number]  Admit date - 03/31/2018   Admitting Physician Lady Deutscher, MD  Outpatient Primary MD for the patient is Aldine Contes, MD  LOS - 0  Chief Complaint  Patient presents with  . Loss of Consciousness       Brief Narrative  [name] is a 83 y.o. female with medical history significant of [name], gastroesophageal reflux disease, hypothyroidism, hypertension, hyperlipidemia, osteoarthritis, venous insufficiency, and unspecified heart murmur who presents the emergency department complaining of syncopal episode today.  Was in her usual state of health she was sitting down and started feeling kind of warm and got diaphoretic she decided stand up and go to the bedroom and lie down.  When she stood up she passed out and this was witnessed by her daughter who was present at the time.   Subjective:    Lew Dawes today has, No headache, No chest pain, No abdominal pain - No Nausea, No new weakness tingling or numbness, No Cough - SOB.    Assessment  & Plan :     1.  Syncope and collapse at home.  Suspicious for vasovagal episode along with dehydration.  Echo pending, not orthostatic, ambulated without any problems, no suspicion for seizure.  Stable on telemetry continue to monitor.  PT to evaluate.  New IV fluids.  2.  Hypertension.  In poor control.  Placed on Coreg and Norvasc along with PRN IV hydralazine.  Monitor and adjust.  3.  CAD.  On aspirin statin and beta-blocker for secondary prevention no acute issues.  4.  GERD.  Will resume PPI.  5.  ARF.  Hold ARB, continue to hydrate, good urine output, creatinine coming down.  Repeat BMP in the  morning.  Not orthostatic.    Family Communication  :  Daughter over the phone  Code Status :  Full  Disposition Plan  :  TBD  Consults  :  None  Procedures  :    TTE  DVT Prophylaxis  :  Lovenox   Lab Results  Component Value Date   PLT 226 04/01/2018    Diet :  Diet Order  Diet Heart Room service appropriate? Yes; Fluid consistency: Thin  Diet effective now               Inpatient Medications Scheduled Meds: . amLODipine  10 mg Oral Daily  . aspirin EC  81 mg Oral Daily  . atorvastatin  40 mg Oral Daily  . carvedilol  25 mg Oral BID  . enoxaparin (LOVENOX) injection  40 mg Subcutaneous Q24H  . hydrALAZINE  10 mg Intravenous Once   Continuous Infusions: . 0.9 % NaCl with KCl 20 mEq / L 75 mL/hr at 04/01/18 0617   PRN Meds:.acetaminophen **OR** acetaminophen, alum & mag hydroxide-simeth, [DISCONTINUED] ondansetron **OR** ondansetron (ZOFRAN) IV, oxyCODONE, polyethylene glycol, traZODone  Antibiotics  :   Anti-infectives (From admission, onward)   None          Objective:   Vitals:   03/31/18 1715 03/31/18 2004 04/01/18 0558 04/01/18 1248  BP: (!) 171/89 (!) 159/69 (!) 164/78 (!) 225/132  Pulse: 74 74 75 80  Resp: 18 18 16  (!) 22  Temp:  98.5 F (36.9 C) 98.2 F (36.8 C) 97.7 F (36.5 C)  TempSrc:  Oral Oral Oral  SpO2: 95% 96% 93% 98%  Weight:   111.8 kg   Height:        Wt Readings from Last 3 Encounters:  04/01/18 111.8 kg  02/13/18 116.2 kg  10/10/17 114.5 kg     Intake/Output Summary (Last 24 hours) at 04/01/2018 1305 Last data filed at 03/31/2018 1819 Gross per 24 hour  Intake 10 ml  Output -  Net 10 ml     Physical Exam  Awake Alert, Oriented X 3, No new F.N deficits, Normal affect [place].AT,PERRAL Supple Neck,No JVD, No cervical lymphadenopathy appriciated.  Symmetrical Chest wall movement, Good air movement bilaterally, CTAB RRR,No Gallops,Rubs, mild aortic systolic Murmur, No Parasternal Heave +ve B.Sounds,  Abd Soft, No tenderness, No organomegaly appriciated, No rebound - guarding or rigidity. No Cyanosis, Clubbing or edema, No new Rash or bruise      Data Review:    CBC Recent Labs  Lab 03/31/18 1443 03/31/18 1713 04/01/18 0411  WBC 9.0 8.3 7.6  HGB 11.1* 10.3* 9.8*  HCT 37.6 35.2* 31.6*  PLT 245 245 226  MCV 81.7 81.7 80.0  MCH 24.1* 23.9* 24.8*  MCHC 29.5* 29.3* 31.0  RDW 14.8 14.8 14.9  LYMPHSABS 1.3  --   --   MONOABS 0.9  --   --   EOSABS 0.3  --   --   BASOSABS 0.0  --   --     Chemistries  Recent Labs  Lab 03/31/18 1443 03/31/18 1713 04/01/18 0411 04/01/18 0753  NA 137  --  138  --   K 3.4*  --  3.4*  --   CL 104  --  105  --   CO2 24  --  23  --   GLUCOSE 120*  --  136*  --   BUN 22  --  18  --   CREATININE 1.69* 1.52* 1.35*  --   CALCIUM 9.3  --  8.8*  --   MG  --   --   --  1.7  AST 30  --   --   --   ALT 13  --   --   --   ALKPHOS 72  --   --   --   BILITOT 0.9  --   --   --    ------------------------------------------------------------------------------------------------------------------ No  results for input(s): CHOL, HDL, LDLCALC, TRIG, CHOLHDL, LDLDIRECT in the last 72 hours.  Lab Results  Component Value Date   HGBA1C 6.1 (A) 11/14/2017   ------------------------------------------------------------------------------------------------------------------ Recent Labs    03/31/18 1713  TSH 2.801   ------------------------------------------------------------------------------------------------------------------ No results for input(s): VITAMINB12, FOLATE, FERRITIN, TIBC, IRON, RETICCTPCT in the last 72 hours.  Coagulation profile No results for input(s): INR, PROTIME in the last 168 hours.  No results for input(s): DDIMER in the last 72 hours.  Cardiac Enzymes No results for input(s): CKMB, TROPONINI, MYOGLOBIN in the last 168 hours.  Invalid input(s):  CK ------------------------------------------------------------------------------------------------------------------ No results found for: BNP  Micro Results No results found for this or any previous visit (from the past 240 hour(s)).  Radiology Reports Dg Chest 2 View  Result Date: 03/31/2018 CLINICAL DATA:  Syncopal episode. EXAM: CHEST - 2 VIEW COMPARISON:  Body CT 03/16/2018 FINDINGS: Stable enlarged cardiac silhouette. Calcific atherosclerotic disease of the aorta. Chronic eventration of the left hemidiaphragm. There is no evidence of focal airspace consolidation, pleural effusion or pneumothorax. Osseous structures are without acute abnormality. Soft tissues are grossly normal. IMPRESSION: No active cardiopulmonary disease. Stably enlarged cardiac silhouette. Electronically Signed   By: Fidela Salisbury M.D.   On: 03/31/2018 15:14   Ct Chest Wo Contrast  Result Date: 03/16/2018 CLINICAL DATA:  4 mm right middle lobe pulmonary nodule on prior imaging. EXAM: CT CHEST WITHOUT CONTRAST TECHNIQUE: Multidetector CT imaging of the chest was performed following the standard protocol without IV contrast. COMPARISON:  02/06/2017 FINDINGS: Cardiovascular: The heart size is normal. No substantial pericardial effusion. Coronary artery calcification is evident. Atherosclerotic calcification is noted in the wall of the thoracic aorta. Mediastinum/Nodes: No mediastinal lymphadenopathy. No evidence for gross hilar lymphadenopathy although assessment is limited by the lack of intravenous contrast on today's study. The esophagus has normal imaging features. There is no axillary lymphadenopathy. Lungs/Pleura: The central tracheobronchial airways are patent. Stable asymmetric elevation left hemidiaphragm. 5 mm right middle lobe pulmonary nodule identified previously is 4 mm today (79/6) consistent with benign etiology given the 1 year of imaging stability. 6 mm posterior left lung nodule (69/6) is also stable and  was previously characterized is unchanged since 10/25/2013. The 5 mm subpleural nodule in the posterior left lung base identified on the prior study is stable at 5 mm today, likely subpleural lymph node. No new suspicious nodule or mass. No pleural effusion. Upper Abdomen: Unremarkable. Musculoskeletal: No worrisome lytic or sclerotic osseous abnormality. IMPRESSION: 1. Stable exam. No new or progressive findings. 2. 5 mm right middle lobe nodule in question is unchanged in the 1 year interval since prior study consistent with benign etiology. The remaining nodules described on the previous study are also stable consistent with benign findings. 3.  Aortic Atherosclerois (ICD10-170.0) Electronically Signed   By: Misty Stanley M.D.   On: 03/16/2018 21:58    Time Spent in minutes  30   Lala Lund M.D on 04/01/2018 at 1:05 PM  To page go to [email or web address] - password [facility]

## 2018-04-01 NOTE — Progress Notes (Signed)
Carotid artery duplex completed. Refer to "CV Proc" under chart review to view preliminary results.  04/01/2018 4:04 PM Maudry Mayhew, MHA, RVT, RDCS, RDMS

## 2018-04-01 NOTE — Progress Notes (Signed)
Echocardiogram 2D Echocardiogram has been performed.  04/01/2018 3:53 PM Maudry Mayhew, MHA, RVT, RDCS, RDMS

## 2018-04-02 ENCOUNTER — Other Ambulatory Visit: Payer: Self-pay

## 2018-04-02 DIAGNOSIS — M5441 Lumbago with sciatica, right side: Secondary | ICD-10-CM

## 2018-04-02 DIAGNOSIS — R55 Syncope and collapse: Secondary | ICD-10-CM | POA: Diagnosis not present

## 2018-04-02 LAB — BASIC METABOLIC PANEL
Anion gap: 10 (ref 5–15)
BUN: 17 mg/dL (ref 8–23)
CO2: 22 mmol/L (ref 22–32)
Calcium: 8.9 mg/dL (ref 8.9–10.3)
Chloride: 105 mmol/L (ref 98–111)
Creatinine, Ser: 1.24 mg/dL — ABNORMAL HIGH (ref 0.44–1.00)
GFR calc Af Amer: 48 mL/min — ABNORMAL LOW (ref >60)
GFR calc non Af Amer: 41 mL/min — ABNORMAL LOW (ref >60)
Glucose, Bld: 114 mg/dL — ABNORMAL HIGH (ref 70–99)
Potassium: 4.1 mmol/L (ref 3.5–5.1)
SODIUM: 137 mmol/L (ref 135–145)

## 2018-04-02 MED ORDER — AMLODIPINE BESYLATE 10 MG PO TABS: 10.0000 mg | ORAL_TABLET | Freq: Every day | ORAL | 0 refills | Status: DC

## 2018-04-02 NOTE — Progress Notes (Signed)
PT Cancellation Note  Patient Details Name: [name] MRN: [id number] DOB: 05/08/1935   Cancelled Treatment:    Reason Eval/Treat Not Completed: Other (comment).  Pt is not here, discharged from [facility].   Ramond Dial 04/02/2018, 4:03 PM   Mee Hives, PT MS Acute Rehab Dept. Number: [place] and [place]

## 2018-04-02 NOTE — Telephone Encounter (Signed)
acetaminophen-codeine (TYLENOL #3) 300-30 MG tablet, REFILL REQUEST @  [id number] - [place] ([place]), [place] - [address] [phone] (Phone) [phone] (Fax)    

## 2018-04-02 NOTE — Discharge Instructions (Signed)
Syncope Syncope is when you pass out (faint) for a short time. It is caused by a sudden decrease in blood flow to the brain. Signs that you may be about to pass out include:  Feeling dizzy or light-headed.  Feeling sick to your stomach (nauseous).  Seeing all white or all black.  Having cold, clammy skin. If you pass out, get help right away. Call your local emergency services (911 in the [place] Do not drive yourself to the hospital. Follow these instructions at home: Watch for any changes in your symptoms. Take these actions to stay safe and help with your symptoms: Lifestyle  Do not drive, use machinery, or play sports until your doctor says it is okay.  Do not drink alcohol.  Do not use any products that contain nicotine or tobacco, such as cigarettes and e-cigarettes. If you need help quitting, ask your doctor.  Drink enough fluid to keep your pee (urine) pale yellow. General instructions  Take over-the-counter and prescription medicines only as told by your doctor.  If you are taking blood pressure or heart medicine, sit up and stand up slowly. Spend a few minutes getting ready to sit and then stand. This can help you feel less dizzy.  Have someone stay with you until you feel stable.  If you start to feel like you might pass out, lie down right away and raise (elevate) your feet above the level of your heart. Breathe deeply and steadily. Wait until all of the symptoms are gone.  Keep all follow-up visits as told by your doctor. This is important. Get help right away if:  You have a very bad headache.  You pass out once or more than once.  You have pain in your chest, belly, or back.  You have a very fast or uneven heartbeat (palpitations).  It hurts to breathe.  You are bleeding from your mouth or your bottom (rectum).  You have black or tarry poop (stool).  You have jerky movements that you cannot control (seizure).  You are confused.  You have trouble  walking.  You are very weak.  You have vision problems. These symptoms may be an emergency. Do not wait to see if the symptoms will go away. Get medical help right away. Call your local emergency services (911 in the [place] Do not drive yourself to the hospital. Summary  Syncope is when you pass out (faint) for a short time. It is caused by a sudden decrease in blood flow to the brain.  Signs that you may be about to faint include feeling dizzy, light-headed, or sick to your stomach, seeing all white or all black, or having cold, clammy skin.  If you start to feel like you might pass out, lie down right away and raise (elevate) your feet above the level of your heart. Breathe deeply and steadily. Wait until all of the symptoms are gone. This information is not intended to replace advice given to you by your health care provider. Make sure you discuss any questions you have with your health care provider. Document Released: 08/10/2007 Document Revised: 04/05/2017 Document Reviewed: 04/05/2017 Elsevier Interactive Patient Education  2019 [place] Follow with Primary MD Aldine Contes, MD in 7 days   Get CBC, CMP checked  by Primary MD  in 5-7 days    Activity: As tolerated with Full fall precautions use walker/cane & assistance as needed  Disposition Home   Diet: Heart Healthy   Special Instructions: If you have  smoked or chewed Tobacco  in the last 2 yrs please stop smoking, stop any regular Alcohol  and or any Recreational drug use.  On your next visit with your primary care physician please Get Medicines reviewed and adjusted.  Please request your Prim.MD to go over all Hospital Tests and Procedure/Radiological results at the follow up, please get all Hospital records sent to your Prim MD by signing hospital release before you go home.  If you experience worsening of your admission symptoms, develop shortness of breath, life threatening emergency, suicidal or homicidal  thoughts you must seek medical attention immediately by calling 911 or calling your MD immediately  if symptoms less severe.  You Must read complete instructions/literature along with all the possible adverse reactions/side effects for all the Medicines you take and that have been prescribed to you. Take any new Medicines after you have completely understood and accpet all the possible adverse reactions/side effects.

## 2018-04-02 NOTE — Discharge Summary (Signed)
[name] WYO:[id number] DOB: [birth year] DOA: 03/31/2018  PCP: Aldine Contes, MD  Admit date: 03/31/2018  Discharge date: 04/02/2018  Admitted From: Home  Disposition:  Home   Recommendations for Outpatient Follow-up:   Follow up with PCP in 1-2 weeks  PCP Please obtain BMP/CBC, 2 view CXR in 1week,  (see Discharge instructions)   PCP Please follow up on the following pending results:    Home Health: PT,RN   Equipment/Devices: Rolling walker  Consultations: None Discharge Condition: Stable   CODE STATUS: Full   Diet Recommendation: Heart Healthy     Chief Complaint  Patient presents with  . Loss of Consciousness     Brief history of present illness from the day of admission and additional interim summary    [place] a 83 y.o.femalewith medical history significant ofNeri artery calcifications, gastroesophageal reflux disease, hypothyroidism, hypertension, hyperlipidemia, osteoarthritis, venous insufficiency, and unspecified heart murmur who presents the emergency department complaining of syncopal episode today. Was in her usual state of health she was sitting down and started feeling kind of warm and got diaphoretic she decided stand up and go to the bedroom and lie down. When she stood up she passed out and this was witnessed by her daughter who was present at the time.                                                                 Hospital Course   1.  Syncope and collapse at home.  Suspicious for vasovagal episode along with dehydration.  Was hydrated, echocardiogram and cardiac ultrasound was unremarkable, she ambulated well and is symptom-free, will be discharged home with home PT RN along with a rolling walker.  Not orthostatic.  2.  Hypertension.  In poor control.  Placed on Coreg and  Norvasc good control, ARB stopped due to ARF.    PCP to monitor and adjust.  3.  CAD.  On aspirin statin and beta-blocker for secondary prevention no acute issues.  4.  GERD.  Will resume PPI.  5.  ARF.    ARB discontinued was hydrated, renal function back to baseline.      Discharge diagnosis     Principal Problem:   Syncope and collapse Active Problems:   Hypothyroidism   Hyperlipidemia   Essential hypertension   GERD (gastroesophageal reflux disease)   Hypokalemia   Murmur, cardiac   Coronary artery calcification seen on CAT scan   Syncope    Discharge instructions    Discharge Instructions    Diet - low sodium heart healthy   Complete by:  As directed    Discharge instructions   Complete by:  As directed    Follow with Primary MD Aldine Contes, MD in 7 days   Get CBC, CMP checked  by Primary  MD  in 5-7 days    Activity: As tolerated with Full fall precautions use walker/cane & assistance as needed  Disposition Home   Diet: Heart Healthy   Special Instructions: If you have smoked or chewed Tobacco  in the last 2 yrs please stop smoking, stop any regular Alcohol  and or any Recreational drug use.  On your next visit with your primary care physician please Get Medicines reviewed and adjusted.  Please request your Prim.MD to go over all Hospital Tests and Procedure/Radiological results at the follow up, please get all Hospital records sent to your Prim MD by signing hospital release before you go home.  If you experience worsening of your admission symptoms, develop shortness of breath, life threatening emergency, suicidal or homicidal thoughts you must seek medical attention immediately by calling 911 or calling your MD immediately  if symptoms less severe.  You Must read complete instructions/literature along with all the possible adverse reactions/side effects for all the Medicines you take and that have been prescribed to you. Take any new Medicines  after you have completely understood and accpet all the possible adverse reactions/side effects.   Increase activity slowly   Complete by:  As directed       Discharge Medications   Allergies as of 04/02/2018      Reactions   Acyclovir And Related    Metoprolol Itching   Food Rash, Other (See Comments)   Pt states that she is allergic to pickles.        Medication List    STOP taking these medications   losartan 100 MG tablet Commonly known as:  COZAAR     TAKE these medications   acetaminophen-codeine 300-30 MG tablet Commonly known as:  TYLENOL #3 Take 1 tablet by mouth every 4 (four) hours as needed for moderate pain.   albuterol 108 (90 Base) MCG/ACT inhaler Commonly known as:  PROAIR HFA Inhale 2 puffs into the lungs every 4 (four) hours as needed for wheezing or shortness of breath.   amLODipine 10 MG tablet Commonly known as:  NORVASC Take 1 tablet (10 mg total) by mouth daily. Start taking on:  April 03, 2018   aspirin EC 81 MG tablet Take 81 mg by mouth daily.   atorvastatin 40 MG tablet Commonly known as:  LIPITOR TAKE 1 TABLET BY MOUTH ONCE DAILY   calcium citrate-vitamin D 315-200 MG-UNIT tablet Commonly known as:  CITRACAL+D Take 1 tablet by mouth 2 (two) times daily.   carvedilol 25 MG tablet Commonly known as:  COREG TAKE 1 TABLET BY MOUTH TWICE DAILY WITH A MEAL What changed:  See the new instructions.   diclofenac sodium 1 % Gel Commonly known as:  VOLTAREN Apply 2 g topically as needed. What changed:  reasons to take this   pantoprazole 40 MG tablet Commonly known as:  PROTONIX Take 1 tablet (40 mg total) by mouth daily.   VITAMIN B12 PO Take 1 tablet daily by mouth.            Durable Medical Equipment  (From admission, onward)         Start     Ordered   04/02/18 0935  For home use only DME Walker rolling  Once    Comments:  5 wheel  Question:  Patient needs a walker to treat with the following condition  Answer:   Syncope   04/02/18 0934          Follow-up Information  Aldine Contes, MD. Schedule an appointment as soon as possible for a visit in 1 week(s).   Specialty:  Internal Medicine Contact information: [place], [place] [phone]           Major procedures and Radiology Reports - PLEASE review detailed and final reports thoroughly  -     TTE -  - Left ventricle: Not well visualized. Grossly LV appears normal in size with normal systolicfunction, LVEF 60-63% Doppler parameters  are consistent with abnormal left ventricular relaxation (grade 1 diastolic dysfunction). Doppler parameters are consistent with high ventricular filling pressure. - Technically difficult study. Very limited visualization even with  the use of echocontrast, limited diagnostic ability.   Dg Chest 2 View  Result Date: 03/31/2018 CLINICAL DATA:  Syncopal episode. EXAM: CHEST - 2 VIEW COMPARISON:  Body CT 03/16/2018 FINDINGS: Stable enlarged cardiac silhouette. Calcific atherosclerotic disease of the aorta. Chronic eventration of the left hemidiaphragm. There is no evidence of focal airspace consolidation, pleural effusion or pneumothorax. Osseous structures are without acute abnormality. Soft tissues are grossly normal. IMPRESSION: No active cardiopulmonary disease. Stably enlarged cardiac silhouette. Electronically Signed   By: Fidela Salisbury M.D.   On: 03/31/2018 15:14   Ct Chest Wo Contrast  Result Date: 03/16/2018 CLINICAL DATA:  4 mm right middle lobe pulmonary nodule on prior imaging. EXAM: CT CHEST WITHOUT CONTRAST TECHNIQUE: Multidetector CT imaging of the chest was performed following the standard protocol without IV contrast. COMPARISON:  02/06/2017 FINDINGS: Cardiovascular: The heart size is normal. No substantial pericardial effusion. Coronary artery calcification is evident. Atherosclerotic calcification is noted in the wall of the thoracic aorta.  Mediastinum/Nodes: No mediastinal lymphadenopathy. No evidence for gross hilar lymphadenopathy although assessment is limited by the lack of intravenous contrast on today's study. The esophagus has normal imaging features. There is no axillary lymphadenopathy. Lungs/Pleura: The central tracheobronchial airways are patent. Stable asymmetric elevation left hemidiaphragm. 5 mm right middle lobe pulmonary nodule identified previously is 4 mm today (79/6) consistent with benign etiology given the 1 year of imaging stability. 6 mm posterior left lung nodule (69/6) is also stable and was previously characterized is unchanged since 10/25/2013. The 5 mm subpleural nodule in the posterior left lung base identified on the prior study is stable at 5 mm today, likely subpleural lymph node. No new suspicious nodule or mass. No pleural effusion. Upper Abdomen: Unremarkable. Musculoskeletal: No worrisome lytic or sclerotic osseous abnormality. IMPRESSION: 1. Stable exam. No new or progressive findings. 2. 5 mm right middle lobe nodule in question is unchanged in the 1 year interval since prior study consistent with benign etiology. The remaining nodules described on the previous study are also stable consistent with benign findings. 3.  Aortic Atherosclerois (ICD10-170.0) Electronically Signed   By: Misty Stanley M.D.   On: 03/16/2018 21:58   Vas US Carotid  Result Date: 04/01/2018 Carotid Arterial Duplex Study Indications: Syncope. Limitations: Body habitus, depth of vessels. Performing Technologist: Maudry Mayhew MHA, RDMS, RVT, RDCS  Examination Guidelines: A complete evaluation includes B-mode imaging, spectral Doppler, color Doppler, and power Doppler as needed of all accessible portions of each vessel. Bilateral testing is considered an integral part of a complete examination. Limited examinations for reoccurring indications may be performed as noted.  Right Carotid Findings:  +----------+--------+-------+--------+--------------------------------+--------+           PSV cm/sEDV    StenosisDescribe  Comments                   cm/s                                                    +----------+--------+-------+--------+--------------------------------+--------+ CCA Prox  58      12                                                      +----------+--------+-------+--------+--------------------------------+--------+ CCA Distal45      14                                                      +----------+--------+-------+--------+--------------------------------+--------+ ICA Prox  60      18             smooth, heterogenous and                                                  calcific                                 +----------+--------+-------+--------+--------------------------------+--------+ ICA Distal75      20                                                      +----------+--------+-------+--------+--------------------------------+--------+ ECA       67      8                                                       +----------+--------+-------+--------+--------------------------------+--------+ +----------+--------+-------+----------------+-------------------+           PSV cm/sEDV cmsDescribe        Arm Pressure (mmHG) +----------+--------+-------+----------------+-------------------+ [id number]            Multiphasic, WNL                    +----------+--------+-------+----------------+-------------------+ +---------+--------+--+--------+-+---------+ VertebralPSV cm/s33EDV cm/s7Antegrade +---------+--------+--+--------+-+---------+  Left Carotid Findings: +----------+--------+--------+--------+--------------------------+--------+           PSV cm/sEDV cm/sStenosisDescribe                  Comments +----------+--------+--------+--------+--------------------------+--------+ CCA Prox   73      18              heterogenous                       +----------+--------+--------+--------+--------------------------+--------+ CCA Distal49      10                                                 +----------+--------+--------+--------+--------------------------+--------+  ICA Prox  52      12              smooth and heterogenous            +----------+--------+--------+--------+--------------------------+--------+ ICA Distal67      23                                                 +----------+--------+--------+--------+--------------------------+--------+ ECA       59      9               irregular and heterogenous         +----------+--------+--------+--------+--------------------------+--------+ +----------+--------+--------+----------------+-------------------+ SubclavianPSV cm/sEDV cm/sDescribe        Arm Pressure (mmHG) +----------+--------+--------+----------------+-------------------+           58              Multiphasic, WNL                    +----------+--------+--------+----------------+-------------------+ +---------+--------+--+--------+--+---------+ VertebralPSV cm/s48EDV cm/s11Antegrade +---------+--------+--+--------+--+---------+  Summary: Right Carotid: Velocities in the right ICA are consistent with a 1-39% stenosis. Left Carotid: Velocities in the left ICA are consistent with a 1-39% stenosis. Vertebrals:  Bilateral vertebral arteries demonstrate antegrade flow. Subclavians: Normal flow hemodynamics were seen in bilateral subclavian              arteries. *See table(s) above for measurements and observations.     Preliminary     Micro Results    No results found for this or any previous visit (from the past 240 hour(s)).  Today   Subjective    Lew Dawes today has no headache,no chest abdominal pain,no new weakness tingling or numbness, feels much better wants to go home today.    Objective   Blood pressure (!) 132/58,  pulse 75, temperature 98.3 F (36.8 C), temperature source Oral, resp. rate (!) 22, height 5\' 2"  (1.575 m), weight 111.8 kg, SpO2 96 %.   Intake/Output Summary (Last 24 hours) at 04/02/2018 0934 Last data filed at 04/01/2018 1300 Gross per 24 hour  Intake 320 ml  Output -  Net 320 ml    Exam Awake Alert, Oriented x 3, No new F.N deficits, Normal affect [place].AT,PERRAL Supple Neck,No JVD, No cervical lymphadenopathy appriciated.  Symmetrical Chest wall movement, Good air movement bilaterally, CTAB RRR,No Gallops,Rubs or new Murmurs, No Parasternal Heave +ve B.Sounds, Abd Soft, Non tender, No organomegaly appriciated, No rebound -guarding or rigidity. No Cyanosis, Clubbing or edema, No new Rash or bruise   Data Review   CBC w Diff:  Lab Results  Component Value Date   WBC 7.6 04/01/2018   HGB 9.8 (L) 04/01/2018   HCT 31.6 (L) 04/01/2018   PLT 226 04/01/2018   LYMPHOPCT 14 03/31/2018   MONOPCT 10 03/31/2018   EOSPCT 3 03/31/2018   BASOPCT 0 03/31/2018    CMP:  Lab Results  Component Value Date   NA 137 04/02/2018   NA 143 11/14/2017   K 4.1 04/02/2018   CL 105 04/02/2018   CO2 22 04/02/2018   BUN 17 04/02/2018   BUN 17 11/14/2017   CREATININE 1.24 (H) 04/02/2018   CREATININE 0.91 09/23/2014   PROT 7.2 03/31/2018   ALBUMIN 3.5 03/31/2018   BILITOT 0.9 03/31/2018   ALKPHOS 72 03/31/2018   AST 30 03/31/2018   ALT  13 03/31/2018  .   Total Time in preparing paper work, data evaluation and todays exam - 21 minutes  Lala Lund M.D on 04/02/2018 at 9:34 AM  [facility]   Office  [phone]

## 2018-04-03 ENCOUNTER — Telehealth: Payer: Self-pay

## 2018-04-03 DIAGNOSIS — I872 Venous insufficiency (chronic) (peripheral): Secondary | ICD-10-CM | POA: Diagnosis not present

## 2018-04-03 DIAGNOSIS — Z9181 History of falling: Secondary | ICD-10-CM | POA: Diagnosis not present

## 2018-04-03 DIAGNOSIS — K219 Gastro-esophageal reflux disease without esophagitis: Secondary | ICD-10-CM | POA: Diagnosis not present

## 2018-04-03 DIAGNOSIS — Z8701 Personal history of pneumonia (recurrent): Secondary | ICD-10-CM | POA: Diagnosis not present

## 2018-04-03 DIAGNOSIS — M545 Low back pain: Secondary | ICD-10-CM | POA: Diagnosis not present

## 2018-04-03 DIAGNOSIS — M170 Bilateral primary osteoarthritis of knee: Secondary | ICD-10-CM | POA: Diagnosis not present

## 2018-04-03 DIAGNOSIS — I2510 Atherosclerotic heart disease of native coronary artery without angina pectoris: Secondary | ICD-10-CM | POA: Diagnosis not present

## 2018-04-03 DIAGNOSIS — E785 Hyperlipidemia, unspecified: Secondary | ICD-10-CM | POA: Diagnosis not present

## 2018-04-03 DIAGNOSIS — Z87891 Personal history of nicotine dependence: Secondary | ICD-10-CM | POA: Diagnosis not present

## 2018-04-03 DIAGNOSIS — E039 Hypothyroidism, unspecified: Secondary | ICD-10-CM | POA: Diagnosis not present

## 2018-04-03 DIAGNOSIS — J45909 Unspecified asthma, uncomplicated: Secondary | ICD-10-CM | POA: Diagnosis not present

## 2018-04-03 DIAGNOSIS — I10 Essential (primary) hypertension: Secondary | ICD-10-CM | POA: Diagnosis not present

## 2018-04-03 DIAGNOSIS — Z7982 Long term (current) use of aspirin: Secondary | ICD-10-CM | POA: Diagnosis not present

## 2018-04-03 DIAGNOSIS — G8929 Other chronic pain: Secondary | ICD-10-CM | POA: Diagnosis not present

## 2018-04-03 MED ORDER — ACETAMINOPHEN-CODEINE #3 300-30 MG PO TABS: 1.0000 | ORAL_TABLET | ORAL | 0 refills | Status: DC | PRN

## 2018-04-03 NOTE — Telephone Encounter (Signed)
Returned call to [place], Therapist, sports with [facility]. Verbal auth given for [facility] skilled Nursing 2 week 2, 1 week 3 and 2 prn visits to monitor BP, hydration and med management. Will route to PCP for agreement/denial.  Patient with recent hospitalization on different service. Discharge instructions said to stop losartan and start amlodipine but there was no mention of lisinopril. Patient has continued to take lisinopril 40 mg daily. Will route to PCP for clarification as this med is no longer on current med list.  Morey Hummingbird will have patient call office to schedule HFU as next appt with PCP is not till 05/15/2018. Will also route this to front office to assist in scheduling. Hubbard Hartshorn, RN, BSN

## 2018-04-03 NOTE — Telephone Encounter (Signed)
Morey Hummingbird with [facility] requesting VO for Home health nursing. Please call back.

## 2018-04-03 NOTE — Telephone Encounter (Signed)
I agree with VO. Please ask patient to hold lisinopril till her follow up. Thank you

## 2018-04-04 NOTE — Telephone Encounter (Signed)
Thank you :)

## 2018-04-04 NOTE — Telephone Encounter (Signed)
Returned call to [place]. She will explain to patient need to hold lisinopril till HFU. Scheduled HFU for 04/09/2018 at 1045. She will have patient call to r/s if that doesn't work for her. Hubbard Hartshorn, RN, BSN

## 2018-04-05 ENCOUNTER — Telehealth: Payer: Self-pay

## 2018-04-05 NOTE — Telephone Encounter (Signed)
[name] with [facility] requesting VO for PT. Please call back.  

## 2018-04-05 NOTE — Telephone Encounter (Signed)
I agree

## 2018-04-05 NOTE — Telephone Encounter (Signed)
[place] calls VO for [facility] PT 1xweek for 1 week, 2x week for 2 weeks, 1xweek for 1 week Also bedside commode, which PT will obtain for pt VO given, do you agree?

## 2018-04-06 DIAGNOSIS — R269 Unspecified abnormalities of gait and mobility: Secondary | ICD-10-CM | POA: Diagnosis not present

## 2018-04-06 DIAGNOSIS — M5441 Lumbago with sciatica, right side: Secondary | ICD-10-CM | POA: Diagnosis not present

## 2018-04-06 DIAGNOSIS — R55 Syncope and collapse: Secondary | ICD-10-CM | POA: Diagnosis not present

## 2018-04-09 ENCOUNTER — Encounter: Payer: Self-pay | Admitting: Internal Medicine

## 2018-04-09 ENCOUNTER — Ambulatory Visit (INDEPENDENT_AMBULATORY_CARE_PROVIDER_SITE_OTHER): Payer: PPO | Admitting: Internal Medicine

## 2018-04-09 VITALS — BP 134/78 | HR 77 | Temp 98.6°F | Wt 251.3 lb

## 2018-04-09 DIAGNOSIS — Z8679 Personal history of other diseases of the circulatory system: Secondary | ICD-10-CM

## 2018-04-09 DIAGNOSIS — R011 Cardiac murmur, unspecified: Secondary | ICD-10-CM

## 2018-04-09 DIAGNOSIS — Z09 Encounter for follow-up examination after completed treatment for conditions other than malignant neoplasm: Secondary | ICD-10-CM | POA: Diagnosis not present

## 2018-04-09 DIAGNOSIS — Z79899 Other long term (current) drug therapy: Secondary | ICD-10-CM

## 2018-04-09 DIAGNOSIS — D649 Anemia, unspecified: Secondary | ICD-10-CM

## 2018-04-09 DIAGNOSIS — I10 Essential (primary) hypertension: Secondary | ICD-10-CM | POA: Diagnosis not present

## 2018-04-09 DIAGNOSIS — R55 Syncope and collapse: Secondary | ICD-10-CM

## 2018-04-09 DIAGNOSIS — J069 Acute upper respiratory infection, unspecified: Secondary | ICD-10-CM | POA: Diagnosis not present

## 2018-04-09 HISTORY — DX: Acute upper respiratory infection, unspecified: J06.9

## 2018-04-09 NOTE — Assessment & Plan Note (Addendum)
Patient was instructed to hold losartan on discharge due to AKI.  She has her medications with her, and has been taking both losartan and and old prescription of lisinopril in addition to her amlodipine and carvedilol.  Blood pressure today is well controlled, 134/78. She reports often being confused on what she is supposed to take.  We went through all of her medications today and discarded her old lisinopril prescription.  Instructed patient to hold losartan for now until I call her with her lab results.  -- Continue Carvedilol 25 BID -- Continue amlodipine 10 mg daily  -- Hold losartan pending BMP -- Stop lisinopril   ADDENDUM: Renal function at baseline; called patient and instructed her to continue her losartan.

## 2018-04-09 NOTE — Progress Notes (Signed)
Internal Medicine Clinic Attending  Case discussed with Dr. Guilloud at the time of the visit.  We reviewed the resident's history and exam and pertinent patient test results.  I agree with the assessment, diagnosis, and plan of care documented in the resident's note.  

## 2018-04-09 NOTE — Patient Instructions (Addendum)
[name],  It was a pleasure to meet you. Please continue to take your amlodipine and your carvedilol. Please stop taking lisinopril, and please hold your losartan until I call you with your lab results. Please keep your scheduled appointment with your primary care doctor in March. If you have any questions or concerns, call our clinic at [phone] or after hours call [phone] and ask for the internal medicine resident on call. Thank you!  Dr. Philipp Ovens

## 2018-04-09 NOTE — Assessment & Plan Note (Signed)
Patient is complaining of congestion, cough, and sinus pain since being discharged from the hospital.  She has been taking over-the-counter Robitussin without relief.  Informed patient that symptoms are likely viral in nature and should resolve with time.  Advised Flonase and Mucinex as needed over-the-counter.  Instructed to return if cough worsens or she begins to develop fevers.

## 2018-04-09 NOTE — Assessment & Plan Note (Signed)
Patient is here for hospital follow-up.  She was recently admitted after an episode of syncope.  Patient reports standing from a seated position when she suddenly fell and passed out.  She regained consciousness after a few seconds and quickly returned to her baseline.  Work-up in the hospital including EKG, telemetry, and echocardiogram were unrevealing.  She has not had any further syncopal episodes since.  Etiology is likely vasovagal.  However she was noted to have mild worsening of her chronic anemia on admission labs.  We are repeating CBC and checking iron studies.  --Anemia work-up - Follow-up as needed

## 2018-04-09 NOTE — Progress Notes (Signed)
   CC: Syncope follow up  HPI:  [name] is a 83 y.o. female with past medical history outlined below here for syncope follow up. For the details of today's visit, please refer to the assessment and plan.  Past Medical History:  Diagnosis Date  . Allergic rhinitis, cause unspecified   . Asthma   . Cancer ([place])   . Chronic back pain   . Coronary artery calcification seen on CAT scan 04/21/2017  . Enlarged pulmonary artery ([place]) 04/21/2017   By chest CT  . GERD (gastroesophageal reflux disease) 05/19/2011  . history of Bilateral leg edema-likely amlodipine induced. 07/28/2011  . history of CAP (community acquired pneumonia) 07/14/2011  . history of HYPOTHYROIDISM, BORDERLINE 03/20/2006   Annotation: Asymptomatic, untreated Qualifier: Diagnosis of  By: Tomasa Hosteller MD, Edmon Crape.   . Hx of breast cancer   . Hyperlipidemia   . Hypertension   . Hypothyroidism   . Lumbago   . OBESITY NOS 03/20/2006   Qualifier: Diagnosis of  By: Tomasa Hosteller MD, [place] 12/13/2005   Annotation: bilateral knees;right knee injection 6/05 Qualifier: Diagnosis of  By: Tomasa Hosteller MD, Veronique D.   . right shoulder pain, likely Impingement syndrome  09/09/2010  . VENOUS INSUFFICIENCY, CHRONIC 03/20/2006   Qualifier: Diagnosis of  By: Tomasa Hosteller MD, Edmon Crape.     Review of Systems  HENT: Positive for congestion and sinus pain.     Physical Exam:  Vitals:   04/09/18 1042  BP: 134/78  Pulse: 77  Temp: 98.6 F (37 C)  TempSrc: Oral  SpO2: 97%  Weight: 251 lb 4.8 oz (114 kg)    Constitutional: NAD, appears comfortable Cardiovascular: RRR, no 3/6 systolic ejection murmur LUSB Pulmonary/Chest: CTAB, no wheezes, rales, or rhonchi.  Extremities: Warm and well perfused. No edema.  Psychiatric: Normal mood and affect  Assessment & Plan:   See Encounters Tab for problem based charting.  Patient discussed with Dr. Angelia Mould

## 2018-04-09 NOTE — Assessment & Plan Note (Addendum)
Patient has mild worsening of her chronic normocytic anemia, hemoglobin 9.8 on recent admission labs from her baseline of 11.  She denies hematochezia or melena.  --Repeat CBC - Follow-up ferritin, iron, TIBC  ADDENDUM: Ferritin low at 27, TIBC low-normal, and serum iron low at 28. Suspect mixed anemia of chronic disease with iron deficiency. Called patient, will start ferrous sulfate 325 mg once daily. Sent prescription to [facility] on [address].

## 2018-04-10 LAB — BMP8+ANION GAP
Anion Gap: 17 mmol/L (ref 10.0–18.0)
BUN/Creatinine Ratio: 16 (ref 12–28)
BUN: 22 mg/dL (ref 8–27)
CO2: 21 mmol/L (ref 20–29)
CREATININE: 1.38 mg/dL — AB (ref 0.57–1.00)
Calcium: 8.8 mg/dL (ref 8.7–10.3)
Chloride: 100 mmol/L (ref 96–106)
GFR calc Af Amer: 42 mL/min/{1.73_m2} — ABNORMAL LOW (ref >59)
GFR calc non Af Amer: 36 mL/min/{1.73_m2} — ABNORMAL LOW (ref >59)
Glucose: 119 mg/dL — ABNORMAL HIGH (ref 65–99)
Potassium: 4.3 mmol/L (ref 3.5–5.2)
Sodium: 138 mmol/L (ref 134–144)

## 2018-04-10 LAB — IRON AND TIBC
Iron Saturation: 10 % — ABNORMAL LOW (ref 15–55)
Iron: 28 ug/dL (ref 27–139)
TIBC: 293 ug/dL (ref 250–450)
UIBC: 265 ug/dL (ref 118–369)

## 2018-04-10 LAB — CBC
Hematocrit: 35.2 % (ref 34.0–46.6)
Hemoglobin: 10.8 g/dL — ABNORMAL LOW (ref 11.1–15.9)
MCH: 24.5 pg — ABNORMAL LOW (ref 26.6–33.0)
MCHC: 30.7 g/dL — AB (ref 31.5–35.7)
MCV: 80 fL (ref 79–97)
Platelets: 252 10*3/uL (ref 150–450)
RBC: 4.41 x10E6/uL (ref 3.77–5.28)
RDW: 14.3 % (ref 11.7–15.4)
WBC: 5.9 10*3/uL (ref 3.4–10.8)

## 2018-04-10 LAB — FERRITIN: FERRITIN: 27 ng/mL (ref 15–150)

## 2018-04-10 MED ORDER — FERROUS SULFATE 325 (65 FE) MG PO TABS: 325.0000 mg | ORAL_TABLET | Freq: Every day | ORAL | 3 refills | Status: DC

## 2018-04-10 NOTE — Addendum Note (Signed)
Addended by: Jodean Lima on: 04/10/2018 09:49 AM   Modules accepted: Orders

## 2018-04-13 ENCOUNTER — Other Ambulatory Visit: Payer: Self-pay | Admitting: Pharmacy Technician

## 2018-04-13 NOTE — Patient Outreach (Signed)
[facility]) Care Management  04/13/2018  [name] August 09, 1935 [id number]    Follow up call placed to [employer] regarding patient assistance application(s) for Proventil HFA , Clifton James confirms that application has been received and attestation for was mailed out on 2-5.   Will follow up with patient in 2-3 business days to inform her that attestation form was mailed out.  Maud Deed Chana Bode [place] Certified Pharmacy Technician [place] Management Direct Dial:[phone]

## 2018-04-20 ENCOUNTER — Other Ambulatory Visit: Payer: Self-pay | Admitting: Pharmacy Technician

## 2018-04-20 NOTE — Patient Outreach (Addendum)
[facility]) Care Management  04/20/2018  TARALEE MARCUS 08-26-1935 [id number]    Successful call placed to patient regarding patient assistance update for Proventil HFA, HIPAA identifiers verified. Informed her that [employer] out attestation form to her on 2/05. She states that she has not checked her mail recently. Requested that she contact me when she has received the letter from [employer] so that I can assist her with filling out form. She stated she would.  Will follow up with patient in 2-3 business days if she has not returned my call.  Maud Deed Chana Bode [place] Certified Pharmacy Technician [place] Management Direct [phone]   12:03pm  Incoming call from patient stating her grandson checked her mail and letter from [place] was there. Assisted patient with filling Attestation form out.   Will follow up with company in 7-10 business days to confirm attestation form has been received.  Maud Deed Chana Bode [place] Certified Pharmacy Technician [place] Management Direct [phone]

## 2018-04-28 ENCOUNTER — Other Ambulatory Visit: Payer: Self-pay | Admitting: Internal Medicine

## 2018-04-28 DIAGNOSIS — K219 Gastro-esophageal reflux disease without esophagitis: Secondary | ICD-10-CM

## 2018-04-30 NOTE — Telephone Encounter (Signed)
Next appt scheduled 3/10 with PCP. 

## 2018-05-03 ENCOUNTER — Other Ambulatory Visit: Payer: Self-pay | Admitting: Pharmacy Technician

## 2018-05-03 NOTE — Patient Outreach (Signed)
[facility]) Care Management  05/03/2018  [name] [birth year] [id number]    Follow up call placed to [employer] regarding patient assistance application(s) for Proventil HFA , [name] confirms patient has been approved as of 2/24 until 03/07/19. Medication should arrive at patients home 10-14 business days from 2/24.  Will follow up with patient in 10-14 business days to confirm medication has been received.  Maud Deed Chana Bode [place] Certified Pharmacy Technician [place] Management Direct Dial:[phone]

## 2018-05-04 NOTE — Patient Outreach (Signed)
[facility]) Care Management  05/04/2018  [name] 1935-03-17 [id number]    Follow up call placed to [employer] regarding patient assistance application(s) for Proventil HFA , [name] confirms patient has been approved as of 2/24. Medication should arrive at providers home 10-14 business days from 2/24.  Will follow up with patient in 10-14 business days to confirm delivery of medication.  Maud Deed Chana Bode [place] Certified Pharmacy Technician [place] Management Direct Dial:[phone]

## 2018-05-15 ENCOUNTER — Other Ambulatory Visit: Payer: Self-pay

## 2018-05-15 ENCOUNTER — Ambulatory Visit (INDEPENDENT_AMBULATORY_CARE_PROVIDER_SITE_OTHER): Payer: PPO | Admitting: Internal Medicine

## 2018-05-15 ENCOUNTER — Encounter: Payer: Self-pay | Admitting: Internal Medicine

## 2018-05-15 VITALS — BP 157/74 | HR 77 | Temp 98.2°F | Ht 62.0 in | Wt 245.6 lb

## 2018-05-15 DIAGNOSIS — M5441 Lumbago with sciatica, right side: Secondary | ICD-10-CM | POA: Diagnosis not present

## 2018-05-15 DIAGNOSIS — R7303 Prediabetes: Secondary | ICD-10-CM

## 2018-05-15 DIAGNOSIS — J984 Other disorders of lung: Secondary | ICD-10-CM

## 2018-05-15 DIAGNOSIS — D649 Anemia, unspecified: Secondary | ICD-10-CM | POA: Diagnosis not present

## 2018-05-15 DIAGNOSIS — Z6841 Body Mass Index (BMI) 40.0 and over, adult: Secondary | ICD-10-CM

## 2018-05-15 DIAGNOSIS — Z79899 Other long term (current) drug therapy: Secondary | ICD-10-CM | POA: Diagnosis not present

## 2018-05-15 DIAGNOSIS — R55 Syncope and collapse: Secondary | ICD-10-CM

## 2018-05-15 DIAGNOSIS — Z0000 Encounter for general adult medical examination without abnormal findings: Secondary | ICD-10-CM

## 2018-05-15 DIAGNOSIS — I10 Essential (primary) hypertension: Secondary | ICD-10-CM | POA: Diagnosis not present

## 2018-05-15 DIAGNOSIS — E039 Hypothyroidism, unspecified: Secondary | ICD-10-CM

## 2018-05-15 DIAGNOSIS — I288 Other diseases of pulmonary vessels: Secondary | ICD-10-CM

## 2018-05-15 LAB — GLUCOSE, CAPILLARY: Glucose-Capillary: 104 mg/dL — ABNORMAL HIGH (ref 70–99)

## 2018-05-15 LAB — POCT GLYCOSYLATED HEMOGLOBIN (HGB A1C): Hemoglobin A1C: 6.2 % — AB (ref 4.0–5.6)

## 2018-05-15 MED ORDER — AMLODIPINE BESYLATE 5 MG PO TABS: 5.0000 mg | ORAL_TABLET | Freq: Every day | ORAL | 0 refills | Status: DC

## 2018-05-15 MED ORDER — CALCIUM CITRATE-VITAMIN D 315-200 MG-UNIT PO TABS: 1.0000 | ORAL_TABLET | Freq: Two times a day (BID) | ORAL | 3 refills | Status: DC

## 2018-05-15 MED ORDER — ACETAMINOPHEN-CODEINE #3 300-30 MG PO TABS: 1.0000 | ORAL_TABLET | ORAL | 0 refills | Status: DC | PRN

## 2018-05-15 MED ORDER — LOSARTAN POTASSIUM 50 MG PO TABS: 50.0000 mg | ORAL_TABLET | Freq: Every day | ORAL | 1 refills | Status: DC

## 2018-05-15 NOTE — Patient Instructions (Addendum)
-  It was a pleasure seeing you today -We will recheck your blood work today -Please follow-up in 1 month for repeat blood pressure check as well as a follow-up with Dr. Maudie Mercury for medication reconciliation -I will refill your back pain medication today -Please call me if you have any questions or if you need any refills

## 2018-05-16 ENCOUNTER — Telehealth: Payer: Self-pay

## 2018-05-16 LAB — CBC WITH DIFFERENTIAL/PLATELET
BASOS ABS: 0.1 10*3/uL (ref 0.0–0.2)
Basos: 1 %
EOS (ABSOLUTE): 0.3 10*3/uL (ref 0.0–0.4)
Eos: 4 %
HEMOGLOBIN: 11.3 g/dL (ref 11.1–15.9)
Hematocrit: 35.1 % (ref 34.0–46.6)
Immature Grans (Abs): 0 10*3/uL (ref 0.0–0.1)
Immature Granulocytes: 0 %
Lymphocytes Absolute: 1.2 10*3/uL (ref 0.7–3.1)
Lymphs: 20 %
MCH: 25.7 pg — ABNORMAL LOW (ref 26.6–33.0)
MCHC: 32.2 g/dL (ref 31.5–35.7)
MCV: 80 fL (ref 79–97)
MONOCYTES: 10 %
Monocytes Absolute: 0.6 10*3/uL (ref 0.1–0.9)
Neutrophils Absolute: 3.9 10*3/uL (ref 1.4–7.0)
Neutrophils: 65 %
Platelets: 266 10*3/uL (ref 150–450)
RBC: 4.4 x10E6/uL (ref 3.77–5.28)
RDW: 15.9 % — ABNORMAL HIGH (ref 11.7–15.4)
WBC: 5.9 10*3/uL (ref 3.4–10.8)

## 2018-05-16 LAB — BMP8+ANION GAP
Anion Gap: 19 mmol/L — ABNORMAL HIGH (ref 10.0–18.0)
BUN / CREAT RATIO: 16 (ref 12–28)
BUN: 15 mg/dL (ref 8–27)
CO2: 20 mmol/L (ref 20–29)
Calcium: 9.6 mg/dL (ref 8.7–10.3)
Chloride: 103 mmol/L (ref 96–106)
Creatinine, Ser: 0.93 mg/dL (ref 0.57–1.00)
GFR calc Af Amer: 67 mL/min/{1.73_m2} (ref >59)
GFR calc non Af Amer: 58 mL/min/{1.73_m2} — ABNORMAL LOW (ref >59)
Glucose: 111 mg/dL — ABNORMAL HIGH (ref 65–99)
Potassium: 4.1 mmol/L (ref 3.5–5.2)
SODIUM: 142 mmol/L (ref 134–144)

## 2018-05-16 LAB — TSH: TSH: 3.29 u[IU]/mL (ref 0.450–4.500)

## 2018-05-16 LAB — FERRITIN: Ferritin: 36 ng/mL (ref 15–150)

## 2018-05-16 LAB — T4, FREE: Free T4: 1.1 ng/dL (ref 0.82–1.77)

## 2018-05-16 NOTE — Assessment & Plan Note (Signed)
-  Patient has a history of borderline diabetes -A1c done today was 6.2 -Patient encouraged to try and lose weight and to watch her diet -No further work-up at this time -Can consider referring her to Medicare diabetes prevention program on follow-up visit

## 2018-05-16 NOTE — Assessment & Plan Note (Signed)
-  Patient blood pressure is elevated today with systolic blood pressures in the 160s -Patient states that she is unsure the medication that she is supposed to be on. -She was not taking her losartan and was taking only 5 mg of amlodipine.  She states that she had been taking lisinopril 40 mg but this is been off her medication list for well -We will stop lisinopril and restart her losartan 50 mg daily. -We will have her follow-up in 1 month for repeat BMP as well as blood pressure check with the losartan.  We can titrate the losartan up to 50 mg as well as amlodipine up to 10 mg if required on her follow-up visit

## 2018-05-16 NOTE — Assessment & Plan Note (Signed)
-  Patient was recently made to the hospital for an episode of syncope which is likely vasovagal versus secondary to dehydration -Work-up in the hospital including EKG, echocardiogram and telemetry were unrevealing -No further work-up at this time.  Patient denies any further episodes of this

## 2018-05-16 NOTE — Assessment & Plan Note (Signed)
-  This problem is chronic and stable -Patient's weight is 6 pounds less from her prior visit but stable from her visit before that -I explained the patient importance of diet and exercise and losing weight -We will follow-up with her on her next visit

## 2018-05-16 NOTE — Assessment & Plan Note (Signed)
-  This problem is chronic and stable -I have reviewed the  narcotic database and it is appropriate -Patient uses Tylenol 3 only sparingly for severe back pain -We will refill this medication for her today -Currently the the benefits of this medication outweigh the risks -No further work-up at this time

## 2018-05-16 NOTE — Assessment & Plan Note (Signed)
-  This problem is chronic and stable -No clear etiology of this has been identified despite work-up by pulmonology -We will continue albuterol inhaler as needed -Continue to encourage weight loss to see if this will help with her shortness of breath

## 2018-05-16 NOTE — Assessment & Plan Note (Signed)
-  This problem is chronic and stable -Repeat free T4 and TSH were within normal limits today -No need for treatment at this time -We will hold off on repeat testing for now

## 2018-05-16 NOTE — Progress Notes (Signed)
   Subjective:    Patient ID: [name], female    DOB: 1935/10/30, 83 y.o.   MRN: [id number]  HPI  I have seen and examined this patient.  Patient is here for routine follow-up of her CKD and hypothyroidism.  Patient states that she feels well currently except for some persistent back pain.  She states that she ran out of her pain medication for her back pain but did not want to call the office for refill as she did not want to bother me.  She denies any other complaints at this time.  She states that she is confused about the medications that she is supposed to be on at home and we will go over this with her today.   Review of Systems  Constitutional: Negative.   HENT: Negative.   Respiratory: Negative.   Cardiovascular: Negative.   Gastrointestinal: Negative.   Musculoskeletal: Positive for back pain.  Neurological: Negative.   Psychiatric/Behavioral: Negative.        Objective:   Physical Exam Constitutional:      Appearance: Normal appearance.  HENT:     Head: Normocephalic and atraumatic.     Mouth/Throat:     Mouth: Mucous membranes are moist.     Pharynx: Oropharynx is clear.  Neck:     Musculoskeletal: Neck supple.  Cardiovascular:     Rate and Rhythm: Normal rate and regular rhythm.     Heart sounds: Normal heart sounds.  Pulmonary:     Effort: Pulmonary effort is normal.     Breath sounds: Normal breath sounds. No wheezing.  Abdominal:     General: Bowel sounds are normal.     Palpations: Abdomen is soft.     Tenderness: There is no abdominal tenderness.  Musculoskeletal: Normal range of motion.        General: No swelling.  Lymphadenopathy:     Cervical: No cervical adenopathy.  Neurological:     General: No focal deficit present.     Mental Status: She is alert and oriented to person, place, and time.  Psychiatric:        Mood and Affect: Mood normal.        Behavior: Behavior normal.           Assessment & Plan:

## 2018-05-16 NOTE — Assessment & Plan Note (Signed)
-  It is been approximately 10 years since patient last colonoscopy.  We will refer her back to GI for repeat colonoscopy especially given her recent iron deficiency anemia -We will also refer her to the clinical pharmacist for medication review given her confusion with the medication she is supposed to be on

## 2018-05-16 NOTE — Assessment & Plan Note (Signed)
-  Patient was noted to have worsening anemia on her recent hospitalization with hemoglobin of 9.8 from her baseline of 11 -Her ferritin was also low at the time but her TIBC was low as well.  This is consistent with anemia of chronic disease as well as possible iron deficiency anemia -We will continue with iron tabs every other day for now -Repeat CBC done today showed hemoglobin has improved to 11.3 -No further work-up at this time

## 2018-05-16 NOTE — Assessment & Plan Note (Signed)
-  Patient was noted to have an enlarged pulmonary artery on chest CT done in December 2018. -2D echo was done which showed no evidence of pulmonary hypertension -No further work-up at this time

## 2018-05-16 NOTE — Telephone Encounter (Signed)
pantoprazole (PROTONIX) 40 MG tablet, REFILL REQUEST @  [place] ([place]), [place] [phone] (Phone) [phone] (Fax)

## 2018-05-16 NOTE — Telephone Encounter (Signed)
Called pharm, they are filling 90 day script now from dr Dareen Piano in jan 2020.

## 2018-05-17 ENCOUNTER — Telehealth: Payer: Self-pay | Admitting: Internal Medicine

## 2018-05-17 NOTE — Telephone Encounter (Signed)
I called the patient to discuss the results of her blood work with her.  Patient's creatinine is now 0.93 which is actually better than her baseline creatinine.  Patient's anemia has now resolved.  She was encouraged to continue her iron pills every other day for now.  I also informed her about placing a referral to GI for her for repeat colonoscopy given that is been 10 years since her last colonoscopy and she does have a component of iron deficiency anemia.  Patient A1c 6.2 which is similar to her baseline.  We will continue to monitor and A1c yearly.  Patient was encouraged to watch her diet and exercise and to try and lose weight.  Patient's thyroid function was within normal limits as well.  No further work-up at this time.  Patient expresses understanding and is in agreement with plan.

## 2018-05-22 ENCOUNTER — Telehealth: Payer: Self-pay

## 2018-05-22 NOTE — Telephone Encounter (Signed)
Review medication with pt. She's taking her meds as directed and has no issue with them.

## 2018-05-29 ENCOUNTER — Ambulatory Visit: Payer: PPO | Admitting: Pharmacist

## 2018-05-30 ENCOUNTER — Encounter: Payer: Self-pay | Admitting: *Deleted

## 2018-06-14 ENCOUNTER — Other Ambulatory Visit: Payer: Self-pay | Admitting: Pharmacy Technician

## 2018-06-14 NOTE — Patient Outreach (Signed)
[facility]) Care Management  06/14/2018  JUNELLA DOMKE 05-08-1935 [id number]    Successful call placed to patient regarding patient assistance medication receipt from [place], HIPAA identifiers verified. Ms. Collier confirms she received 2 boxes of Proventil HFA from [employer]. Informed her that she has been approved until the end of the year. Reviewed with her to contact [facility] [employer]) when she is getting low so that they can ship her more. She stated she would. Also requested she contact me if she runs into any issues or forgets who to contact for the refills.  Follow up:  Will remove myself from care team and change patient [facility] Active status now that patient assistance for Proventil HFA has been completed.  Maud Deed Chana Bode [place] Certified Pharmacy Technician [place] Management Direct Dial:[phone]

## 2018-06-29 ENCOUNTER — Other Ambulatory Visit: Payer: Self-pay | Admitting: Internal Medicine

## 2018-06-29 DIAGNOSIS — M5441 Lumbago with sciatica, right side: Secondary | ICD-10-CM

## 2018-06-29 NOTE — Telephone Encounter (Signed)
I can't fill this from home. Will forward to Dr. Heber [place] and ask him to fill if possible. If not I will do it on Monday.

## 2018-07-27 ENCOUNTER — Other Ambulatory Visit: Payer: Self-pay | Admitting: Internal Medicine

## 2018-07-27 DIAGNOSIS — I10 Essential (primary) hypertension: Secondary | ICD-10-CM

## 2018-07-27 NOTE — Telephone Encounter (Signed)
Pt needs refill on amLODipine (NORVASC) 5 MG tablet     [place] ([place]), [place]     ;pt contact [phone]

## 2018-07-28 MED ORDER — AMLODIPINE BESYLATE 5 MG PO TABS: 5.0000 mg | ORAL_TABLET | Freq: Every day | ORAL | 1 refills | Status: DC

## 2018-08-01 NOTE — Progress Notes (Signed)
Pt prescreened for Friday's appt. Confirmed telephone call, no smart phone

## 2018-08-03 ENCOUNTER — Other Ambulatory Visit: Payer: Self-pay

## 2018-08-03 ENCOUNTER — Encounter: Payer: Self-pay | Admitting: Gastroenterology

## 2018-08-03 ENCOUNTER — Ambulatory Visit (INDEPENDENT_AMBULATORY_CARE_PROVIDER_SITE_OTHER): Payer: PPO | Admitting: Gastroenterology

## 2018-08-03 VITALS — Ht 62.0 in | Wt 247.0 lb

## 2018-08-03 DIAGNOSIS — D509 Iron deficiency anemia, unspecified: Secondary | ICD-10-CM | POA: Diagnosis not present

## 2018-08-03 DIAGNOSIS — K219 Gastro-esophageal reflux disease without esophagitis: Secondary | ICD-10-CM | POA: Diagnosis not present

## 2018-08-03 DIAGNOSIS — K625 Hemorrhage of anus and rectum: Secondary | ICD-10-CM | POA: Diagnosis not present

## 2018-08-03 MED ORDER — SUPREP BOWEL PREP KIT 17.5-3.13-1.6 GM/177ML PO SOLN: ORAL | 0 refills | Status: DC

## 2018-08-03 NOTE — Patient Instructions (Signed)
If you are age 81 or older, your body mass index should be between 23-30. Your Body mass index is 45.18 kg/m. If this is out of the aforementioned range listed, please consider follow up with your Primary Care Provider.  If you are age 53 or younger, your body mass index should be between 19-25. Your Body mass index is 45.18 kg/m. If this is out of the aformentioned range listed, please consider follow up with your Primary Care Provider.   To help prevent the possible spread of infection to our patients, communities, and staff; we will be implementing the following measures:  As of now we are not allowing any visitors/family members to accompany you to any upcoming appointments with [facility]. If you have any concerns about this please contact our office to discuss prior to the appointment.   You have been scheduled for a colonoscopy on June 30th at 4:00pm. Please follow written instructions mailed to you.   Please pick up your prep supplies at the pharmacy within the next 1-3 days. If you use inhalers (even only as needed), please bring them with you on the day of your procedure. Your physician has requested that you go to www.startemmi.com and enter the access code given to you at your visit today. This web site gives a general overview about your procedure. However, you should still follow specific instructions given to you by our office regarding your preparation for the procedure.  Thank you for entrusting me with your care and for choosing [facility], Dr.  Cellar

## 2018-08-03 NOTE — Progress Notes (Signed)
Virtual Visit via Video Note  I connected with [name] on 08/03/18 at  9:00 AM EDT by a video enabled telemedicine application and verified that I am speaking with the correct person using two identifiers.  I discussed the limitations of evaluation and management by telemedicine and the availability of in person appointments. The patient expressed understanding and agreed to proceed.  THIS ENCOUNTER IS A VIRTUAL VISIT DUE TO COVID-19 - PATIENT WAS NOT SEEN IN THE OFFICE. PATIENT HAS CONSENTED TO VIRTUAL VISIT / TELEMEDICINE VISIT USING [employer] APP   Location of patient: home Location of provider: office Name of referring provider: Aldine Contes MD Persons participating: myself, patient   HPI :  82 y/o female, former Dr. Olevia [name] patient, history of breast cancer, chronic back pain, obesity, last seen in 2010, referred by Dr. Aldine Contes for iron deficiency anemia.   Patient was hospitalized in late January for syncope, thought to be vasovagal etiology in association with dehydration. During that admission she had a worsening anemia at that time, Hgb of 9.8, MCV of 80. Iron studies showed a ferritin of 27, iron level of 28, iron sat of 10%. She was given oral iron, Hgb in March rose to 11.3. Her last Hgb prior to the admission was in 01/2016 at which time the Hgb was 11.1 with an MCV of 78. Labs show chronic anemia dating back a few years with Hgb of 10-11s and MCV in low 80s. No iron studies obtained remotely. Last colonoscopy in 2010 was limited by poor prep, Dr. Olevia [name].  NO blood in the stools routinely, but she has had some blood seen mildly at times, she is unclear as to how long this has been going on. Iron at twice daily constipated her, she is taking it once daily and tolerates it much better. No problems with her bowels otherwise. She takes iron every other day. No abdominal pains. She does have a history of GERD. She takes protonix 40mg , works pretty well for her. Takes  TUMS occasionally. No dysphagia, she eats okay. No nausea or vomiting. She uses tylenol for pain. No NSAIDs. No history of PUD. She denies any cardiopulmonary symptoms at baseline. No family history of GI tract malignancies noted.   Echo 04/01/18 - EF 55-60%  Colonoscopy 11/07/2008 - diverticulosis, hemorrhoids, "poor prep" - Dr. Olevia [name]  Past Medical History:  Diagnosis Date  . Allergic rhinitis, cause unspecified   . Asthma   . Cancer ([place])   . Chronic back pain   . Coronary artery calcification seen on CAT scan 04/21/2017  . Enlarged pulmonary artery ([place]) 04/21/2017   By chest CT  . GERD (gastroesophageal reflux disease) 05/19/2011  . history of Bilateral leg edema-likely amlodipine induced. 07/28/2011  . history of CAP (community acquired pneumonia) 07/14/2011  . history of HYPOTHYROIDISM, BORDERLINE 03/20/2006   Annotation: Asymptomatic, untreated Qualifier: Diagnosis of  By: Tomasa Hosteller MD, Edmon Crape.   . Hx of breast cancer   . Hyperlipidemia   . Hypertension   . Hypothyroidism   . Lumbago   . OBESITY NOS 03/20/2006   Qualifier: Diagnosis of  By: Tomasa Hosteller MD, [place] 12/13/2005   Annotation: bilateral knees;right knee injection 6/05 Qualifier: Diagnosis of  By: Tomasa Hosteller MD, Veronique D.   . right shoulder pain, likely Impingement syndrome  09/09/2010  . VENOUS INSUFFICIENCY, CHRONIC 03/20/2006   Qualifier: Diagnosis of  By: Tomasa Hosteller MD, Edmon Crape.      Past Surgical History:  Procedure Laterality  Date  . ABDOMINAL HYSTERECTOMY    . BREAST LUMPECTOMY    . HERNIA REPAIR    . HIP CLOSED REDUCTION Right 11/26/2014   Procedure: CLOSED REDUCTIION RIGHT HIP;  Surgeon: Newt Minion, MD;  Location: [facility];  Service: Orthopedics;  Laterality: Right;  . INCISIONAL HERNIA REPAIR N/A 10/29/2013   Procedure: OPEN REPAIR OF RECURRENT INCISIONAL HERNIA WITH MESH;  Surgeon: Zenovia Jarred, MD;  Location: [facility];  Service: General;  Laterality: N/A;  . TOTAL HIP  ARTHROPLASTY     Family History  Problem Relation Age of Onset  . Hypertension Mother   . Alzheimer's disease Mother   . Diabetes Sister   . Colon cancer Neg Hx    Social History   Tobacco Use  . Smoking status: Former Smoker    Packs/day: 0.25    Years: 0.50    Pack years: 0.12    Types: Cigarettes    Last attempt to quit: 09/09/1958    Years since quitting: 59.9  . Smokeless tobacco: Never Used  . Tobacco comment: social smoker  Substance Use Topics  . Alcohol use: No    Alcohol/week: 0.0 standard drinks  . Drug use: No   Current Outpatient Medications  Medication Sig Dispense Refill  . acetaminophen-codeine (TYLENOL #3) 300-30 MG tablet TAKE 1 TABLET BY MOUTH EVERY 4 HOURS AS NEEDED FOR MODERATE PAIN 30 tablet 0  . albuterol (PROAIR HFA) 108 (90 Base) MCG/ACT inhaler Inhale 2 puffs into the lungs every 4 (four) hours as needed for wheezing or shortness of breath. 18 g 3  . amLODipine (NORVASC) 5 MG tablet Take 1 tablet (5 mg total) by mouth daily. 90 tablet 1  . aspirin EC 81 MG tablet Take 81 mg by mouth daily.    Marland Kitchen atorvastatin (LIPITOR) 40 MG tablet TAKE 1 TABLET BY MOUTH ONCE DAILY (Patient taking differently: Take 40 mg by mouth daily. ) 90 tablet 1  . calcium citrate-vitamin D (CITRACAL+D) 315-200 MG-UNIT tablet Take 1 tablet by mouth 2 (two) times daily. 120 tablet 3  . carvedilol (COREG) 25 MG tablet TAKE 1 TABLET BY MOUTH TWICE DAILY WITH A MEAL (Patient taking differently: Take 25 mg by mouth 2 (two) times daily. ) 180 tablet 1  . Cyanocobalamin (VITAMIN B12 PO) Take 1 tablet daily by mouth.    . diclofenac sodium (VOLTAREN) 1 % GEL Apply 2 g topically as needed. (Patient taking differently: Apply 2 g topically as needed (joint pain). ) 1 Tube 3  . ferrous sulfate 325 (65 FE) MG tablet Take 1 tablet (325 mg total) by mouth daily. (Patient taking differently: Take 325 mg by mouth every other day. ) 30 tablet 3  . losartan (COZAAR) 50 MG tablet Take 1 tablet (50 mg  total) by mouth daily. 90 tablet 1  . pantoprazole (PROTONIX) 40 MG tablet TAKE 1 TABLET BY MOUTH ONCE DAILY 90 tablet 0   No current facility-administered medications for this visit.    Allergies  Allergen Reactions  . Acyclovir And Related   . Metoprolol Itching  . Food Rash and Other (See Comments)    Pt states that she is allergic to pickles.       Review of Systems: All systems reviewed and negative except where noted in HPI.   Lab Results  Component Value Date   WBC 5.9 05/15/2018   HGB 11.3 05/15/2018   HCT 35.1 05/15/2018   MCV 80 05/15/2018   PLT 266 05/15/2018  CBC Latest Ref Rng & Units 05/15/2018 04/09/2018 04/01/2018  WBC 3.4 - 10.8 x10E3/uL 5.9 5.9 7.6  Hemoglobin 11.1 - 15.9 g/dL 11.3 10.8(L) 9.8(L)  Hematocrit 34.0 - 46.6 % 35.1 35.2 31.6(L)  Platelets 150 - 450 x10E3/uL 266 252 226   Lab Results  Component Value Date   IRON 28 04/09/2018   TIBC 293 04/09/2018   FERRITIN 36 05/15/2018     Physical Exam: Ht 5\' 2"  (1.575 m) Comment: pt provided over the phone  Wt 247 lb (112 kg) Comment: pt provided over the phone  BMI 45.18 kg/m  NA   ASSESSMENT AND PLAN: 83 y/o female here for a new patient assessment of the following:  Iron deficiency / rectal bleeding - patient appears to have a mild chronic microcytic anemia, iron studies in the hospital showed a low iron sat. Her Hgb rose and normalized on oral iron. I discussed ddx of iron deficiency with her. Her last colonoscopy was 10 years ago and limited by a poor prep. She has occasional mild rectal bleeding on questioning today. I think EGD and colonoscopy to evaluate her anemia are reasonable, she has no significant cardiopulmonary comorbidities and should be average risk for anesthesia. We discussed EGD and colonoscopy and risks of the procedures and anesthesia. Following this discussion she wanted to proceed. In light of her poor prep on her last exam, recommend 2 day prep for this exam to ensure she is  adequately prepped, she was agreeable to this, will plan on doing this in the upcoming weeks. She agreed.   GERD - controlled with protonix, no history of EGD, will proceed with EGD given iron deficiency as above, will also screen for BE during this exam.   Virgil Cellar, MD [facility]  CC: Aldine Contes, MD

## 2018-08-07 DIAGNOSIS — H25811 Combined forms of age-related cataract, right eye: Secondary | ICD-10-CM | POA: Diagnosis not present

## 2018-08-07 DIAGNOSIS — H31092 Other chorioretinal scars, left eye: Secondary | ICD-10-CM | POA: Diagnosis not present

## 2018-08-07 DIAGNOSIS — H348322 Tributary (branch) retinal vein occlusion, left eye, stable: Secondary | ICD-10-CM | POA: Diagnosis not present

## 2018-08-07 DIAGNOSIS — H401123 Primary open-angle glaucoma, left eye, severe stage: Secondary | ICD-10-CM | POA: Diagnosis not present

## 2018-08-07 DIAGNOSIS — Z961 Presence of intraocular lens: Secondary | ICD-10-CM | POA: Diagnosis not present

## 2018-08-08 ENCOUNTER — Telehealth: Payer: Self-pay

## 2018-08-08 NOTE — Telephone Encounter (Signed)
Called pt to go over instructions for colonoscopy on 6-30. She recently moved and does not yet have a key to the mailbox for her apartment. She will call me to go over the instructions once she receives them.

## 2018-08-13 ENCOUNTER — Other Ambulatory Visit: Payer: Self-pay | Admitting: Internal Medicine

## 2018-08-13 DIAGNOSIS — E7800 Pure hypercholesterolemia, unspecified: Secondary | ICD-10-CM

## 2018-08-13 DIAGNOSIS — K219 Gastro-esophageal reflux disease without esophagitis: Secondary | ICD-10-CM

## 2018-08-13 MED ORDER — PANTOPRAZOLE SODIUM 40 MG PO TBEC: 40.0000 mg | DELAYED_RELEASE_TABLET | Freq: Every day | ORAL | 1 refills | Status: DC

## 2018-08-13 MED ORDER — ATORVASTATIN CALCIUM 40 MG PO TABS: 40.0000 mg | ORAL_TABLET | Freq: Every day | ORAL | 1 refills | Status: DC

## 2018-08-13 NOTE — Telephone Encounter (Signed)
Pt needs refill on pantoprazole (PROTONIX) 40 MG tablet  atorvastatin (LIPITOR) 40 MG tablet  ;pt contact [place] ([place]), [place] - [place]

## 2018-08-16 NOTE — Telephone Encounter (Signed)
Called and spoke to pt. She still does not have the key to her new mailbox so she does not have the instructions I mailed to her for her colonoscopy on 6-28. She indicated she will get with the maintenance man to get a key and then will call me tomorrow to go over the instructions.

## 2018-08-17 NOTE — Telephone Encounter (Signed)
Patient called in. Went through prep instructions carefully.  She expressed understanding.  Will call back with questions.

## 2018-09-03 ENCOUNTER — Telehealth: Payer: Self-pay | Admitting: Gastroenterology

## 2018-09-03 NOTE — Telephone Encounter (Signed)
Spoke with patient regarding Covid-19 screening questions °Covid-19 Screening Questions ° °Do you now or have you had a fever in the last 14 days?     No ° °Do you have any respiratory symptoms of shortness of breath or cough now or in the last 14 days?    No ° °Do you have any family members or close contacts with diagnosed or suspected Covid-19 in the past 14 days?     No ° °Have you been tested for Covid-19 and found to be positive?    No ° °Pt made aware of that care partner may wait in the car or come up to the lobby during the procedure but will need to provide their own mask. ° °  °

## 2018-09-04 ENCOUNTER — Encounter: Payer: Self-pay | Admitting: Gastroenterology

## 2018-09-04 ENCOUNTER — Ambulatory Visit (AMBULATORY_SURGERY_CENTER): Payer: PPO | Admitting: Gastroenterology

## 2018-09-04 ENCOUNTER — Other Ambulatory Visit: Payer: Self-pay

## 2018-09-04 VITALS — BP 161/93 | HR 71 | Temp 97.2°F | Resp 20 | Ht 62.0 in | Wt 247.0 lb

## 2018-09-04 DIAGNOSIS — K31819 Angiodysplasia of stomach and duodenum without bleeding: Secondary | ICD-10-CM

## 2018-09-04 DIAGNOSIS — D509 Iron deficiency anemia, unspecified: Secondary | ICD-10-CM

## 2018-09-04 DIAGNOSIS — K5730 Diverticulosis of large intestine without perforation or abscess without bleeding: Secondary | ICD-10-CM

## 2018-09-04 DIAGNOSIS — K449 Diaphragmatic hernia without obstruction or gangrene: Secondary | ICD-10-CM

## 2018-09-04 DIAGNOSIS — K2950 Unspecified chronic gastritis without bleeding: Secondary | ICD-10-CM | POA: Diagnosis not present

## 2018-09-04 DIAGNOSIS — K2970 Gastritis, unspecified, without bleeding: Secondary | ICD-10-CM

## 2018-09-04 DIAGNOSIS — D649 Anemia, unspecified: Secondary | ICD-10-CM | POA: Diagnosis not present

## 2018-09-04 DIAGNOSIS — K648 Other hemorrhoids: Secondary | ICD-10-CM

## 2018-09-04 DIAGNOSIS — K219 Gastro-esophageal reflux disease without esophagitis: Secondary | ICD-10-CM

## 2018-09-04 DIAGNOSIS — K625 Hemorrhage of anus and rectum: Secondary | ICD-10-CM

## 2018-09-04 DIAGNOSIS — K5520 Angiodysplasia of colon without hemorrhage: Secondary | ICD-10-CM

## 2018-09-04 MED ORDER — SODIUM CHLORIDE 0.9 % IV SOLN: 500.0000 mL | Freq: Once | INTRAVENOUS | Status: DC

## 2018-09-04 NOTE — Op Note (Signed)
[place] Patient Name: [name] Procedure Date: 09/04/2018 3:49 PM MRN: [id number] Endoscopist: Remo Lipps P. Havery Moros , MD Age: 83 Referring MD:  Date of Birth: [birth year] Gender: Female Account #: [id number] Procedure:                Upper GI endoscopy Indications:              Iron deficiency anemia Medicines:                Monitored Anesthesia Care Procedure:                Pre-Anesthesia Assessment:                           - Prior to the procedure, a History and Physical                            was performed, and patient medications and                            allergies were reviewed. The patient's tolerance of                            previous anesthesia was also reviewed. The risks                            and benefits of the procedure and the sedation                            options and risks were discussed with the patient.                            All questions were answered, and informed consent                            was obtained. Prior Anticoagulants: The patient has                            taken no previous anticoagulant or antiplatelet                            agents. ASA Grade Assessment: III - A patient with                            severe systemic disease. After reviewing the risks                            and benefits, the patient was deemed in                            satisfactory condition to undergo the procedure.                           After obtaining informed consent, the endoscope was  passed under direct vision. Throughout the                            procedure, the patient's blood pressure, pulse, and                            oxygen saturations were monitored continuously. The                            Model GIF-[id number] scope was introduced                            through the mouth, and advanced to the second part                            of duodenum. The upper GI  endoscopy was technically                            difficult and complex due to significant looping.                            The patient tolerated the procedure well. Scope In: Scope Out: Findings:                 The Z-line was regular.                           The exam of the esophagus was otherwise normal.                           A suspected paraesophageal hernia was found on                            retroflexion.                           The stomach was J shaped with significant looping.                            Regular endoscope was not long enough to intubate                            the duodenum. The regular upper endoscope was                            removed and replaced with a pediatric colonoscope                            to traverse the pylorus. The entire examined                            stomach was otherwise normal. Biopsies were taken                            with a cold  forceps for Helicobacter pylori testing                            given iron deficiency.                           One single diminutive AVM was noted in the second                            portion of the duodenum. The duodenal bulb and                            second portion of the duodenum were otherwise                            normal. Complications:            No immediate complications. Estimated blood loss:                            Minimal. Estimated Blood Loss:     Estimated blood loss was minimal. Impression:               - Z-line regular.                           - Paraesophageal hernia.                           - J shaped stomach with significant looping,                            requiring pediatric colonscope to intubate the                            duodenum                           - Normal stomach otherwise. Biopsied to rule out H                            pylori given iron deficiency.                           - Diminutive AVM noted in the  duodenum                           - Normal examind duodenum otherwise. Recommendation:           - Patient has a contact number available for                            emergencies. The signs and symptoms of potential                            delayed complications were discussed with the  patient. Return to normal activities tomorrow.                            Written discharge instructions were provided to the                            patient.                           - Resume previous diet.                           - Continue present medications including iron                            supplementation                           - Await pathology results.                           - Trend Hgb / iron studies on supplementation,                            consideration for capsule endoscope if pathology                            results negative and anemia persists Remo Lipps P. Aronda Burford, MD 09/04/2018 4:37:43 PM This report has been signed electronically.

## 2018-09-04 NOTE — Op Note (Signed)
[facility] Patient Name: [name] Procedure Date: 09/04/2018 3:47 PM MRN: [id number] Endoscopist: Remo Lipps P. Havery Moros , MD Age: 83 Referring MD:  Date of Birth: 05-10-1935 Gender: Female Account #: [id number] Procedure:                Colonoscopy Indications:              Iron deficiency anemia Medicines:                Monitored Anesthesia Care Procedure:                Pre-Anesthesia Assessment:                           - Prior to the procedure, a History and Physical                            was performed, and patient medications and                            allergies were reviewed. The patient's tolerance of                            previous anesthesia was also reviewed. The risks                            and benefits of the procedure and the sedation                            options and risks were discussed with the patient.                            All questions were answered, and informed consent                            was obtained. Prior Anticoagulants: The patient has                            taken no previous anticoagulant or antiplatelet                            agents. ASA Grade Assessment: III - A patient with                            severe systemic disease. After reviewing the risks                            and benefits, the patient was deemed in                            satisfactory condition to undergo the procedure.                           After obtaining informed consent, the colonoscope  was passed under direct vision. Throughout the                            procedure, the patient's blood pressure, pulse, and                            oxygen saturations were monitored continuously. The                            Colonoscope was introduced through the anus and                            advanced to the the terminal ileum, with                            identification of the appendiceal orifice  and IC                            valve. The colonoscopy was performed without                            difficulty. The patient tolerated the procedure                            well. The quality of the bowel preparation was                            good. The terminal ileum, ileocecal valve,                            appendiceal orifice, and rectum were photographed. Scope In: 4:11:46 PM Scope Out: 4:26:31 PM Scope Withdrawal Time: 0 hours 8 minutes 47 seconds  Total Procedure Duration: 0 hours 14 minutes 45 seconds  Findings:                 The perianal and digital rectal examinations were                            normal.                           The terminal ileum appeared normal.                           Multiple medium-mouthed diverticula were found in                            the entire colon.                           Internal hemorrhoids were found during retroflexion.                           The exam was otherwise without abnormality. No  polyps Complications:            No immediate complications. Estimated blood loss:                            None. Estimated Blood Loss:     Estimated blood loss: none. Impression:               - The examined portion of the ileum was normal.                           - Diverticulosis in the entire examined colon.                           - Internal hemorrhoids.                           - The examination was otherwise normal.                           - No specimens collected.                           No cause for anemia noted on colonoscopy Recommendation:           - Patient has a contact number available for                            emergencies. The signs and symptoms of potential                            delayed complications were discussed with the                            patient. Return to normal activities tomorrow.                            Written discharge instructions were  provided to the                            patient.                           - Resume previous diet.                           - Continue present medications.                           - Continue iron suppplementation, trend Hgb, await                            EGD pathology results Remo Lipps P. Armbruster, MD 09/04/2018 4:30:40 PM This report has been signed electronically.

## 2018-09-04 NOTE — Progress Notes (Signed)
Called to room to assist during endoscopic procedure.  Patient ID and intended procedure confirmed with present staff. Received instructions for my participation in the procedure from the performing physician.  

## 2018-09-04 NOTE — Patient Instructions (Signed)
Discharge instructions given. Handout on a hiatal hernia. Resume previous medications. YOU HAD AN ENDOSCOPIC PROCEDURE TODAY AT [facility]:   Refer to the procedure report that was given to you for any specific questions about what was found during the examination.  If the procedure report does not answer your questions, please call your gastroenterologist to clarify.  If you requested that your care partner not be given the details of your procedure findings, then the procedure report has been included in a sealed envelope for you to review at your convenience later.  YOU SHOULD EXPECT: Some feelings of bloating in the abdomen. Passage of more gas than usual.  Walking can help get rid of the air that was put into your GI tract during the procedure and reduce the bloating. If you had a lower endoscopy (such as a colonoscopy or flexible sigmoidoscopy) you may notice spotting of blood in your stool or on the toilet paper. If you underwent a bowel prep for your procedure, you may not have a normal bowel movement for a few days.  Please Note:  You might notice some irritation and congestion in your nose or some drainage.  This is from the oxygen used during your procedure.  There is no need for concern and it should clear up in a day or so.  SYMPTOMS TO REPORT IMMEDIATELY:   Following lower endoscopy (colonoscopy or flexible sigmoidoscopy):  Excessive amounts of blood in the stool  Significant tenderness or worsening of abdominal pains  Swelling of the abdomen that is new, acute  Fever of 100F or higher   Following upper endoscopy (EGD)  Vomiting of blood or coffee ground material  New chest pain or pain under the shoulder blades  Painful or persistently difficult swallowing  New shortness of breath  Fever of 100F or higher  Black, tarry-looking stools  For urgent or emergent issues, a gastroenterologist can be reached at any hour by calling [phone].   DIET:  We  do recommend a small meal at first, but then you may proceed to your regular diet.  Drink plenty of fluids but you should avoid alcoholic beverages for 24 hours.  ACTIVITY:  You should plan to take it easy for the rest of today and you should NOT DRIVE or use heavy machinery until tomorrow (because of the sedation medicines used during the test).    FOLLOW UP: Our staff will call the number listed on your records 48-72 hours following your procedure to check on you and address any questions or concerns that you may have regarding the information given to you following your procedure. If we do not reach you, we will leave a message.  We will attempt to reach you two times.  During this call, we will ask if you have developed any symptoms of COVID 19. If you develop any symptoms (ie: fever, flu-like symptoms, shortness of breath, cough etc.) before then, please call [phone].  If you test positive for Covid 19 in the 2 weeks post procedure, please call and report this information to [place].    If any biopsies were taken you will be contacted by phone or by letter within the next 1-3 weeks.  Please call us at [phone] if you have not heard about the biopsies in 3 weeks.    SIGNATURES/CONFIDENTIALITY: You and/or your care partner have signed paperwork which will be entered into your electronic medical record.  These signatures attest to the fact that that the information  above on your After Visit Summary has been reviewed and is understood.  Full responsibility of the confidentiality of this discharge information lies with you and/or your care-partner. 

## 2018-09-04 NOTE — Progress Notes (Signed)
A/ox3, pleased with MAC, report to RN 

## 2018-09-04 NOTE — Progress Notes (Signed)
Temp June or Flat, [place]

## 2018-09-06 ENCOUNTER — Telehealth: Payer: Self-pay

## 2018-09-06 ENCOUNTER — Other Ambulatory Visit: Payer: Self-pay

## 2018-09-06 DIAGNOSIS — M5441 Lumbago with sciatica, right side: Secondary | ICD-10-CM

## 2018-09-06 NOTE — Telephone Encounter (Signed)
acetaminophen-codeine (TYLENOL #3) 300-30 MG tablet, REFILL REQUEST @  [facility] ([address] [place] [phone] (Phone) [phone] (Fax)

## 2018-09-06 NOTE — Telephone Encounter (Signed)
  Follow up Call-  Call back number 09/04/2018  Post procedure Call Back phone  # [phone]  Permission to leave phone message Yes  Some recent data might be hidden     Patient questions:  Do you have a fever, pain , or abdominal swelling? No. Pain Score  0 *  Have you tolerated food without any problems? Yes.    Have you been able to return to your normal activities? Yes.    Do you have any questions about your discharge instructions: Diet   No. Medications  No. Follow up visit  No.  Do you have questions or concerns about your Care? No.  Actions: * If pain score is 4 or above: No action needed, pain <4.  1. Have you developed a fever since your procedure? no  2.   Have you had an respiratory symptoms (SOB or cough) since your procedure? no  3.   Have you tested positive for COVID 19 since your procedure no  4.   Have you had any family members/close contacts diagnosed with the COVID 19 since your procedure?  no   If yes to any of these questions please route to Joylene John, RN and Alphonsa Gin, Therapist, sports.

## 2018-09-10 MED ORDER — ACETAMINOPHEN-CODEINE #3 300-30 MG PO TABS: 1.0000 | ORAL_TABLET | ORAL | 0 refills | Status: DC | PRN

## 2018-09-13 ENCOUNTER — Other Ambulatory Visit: Payer: Self-pay

## 2018-09-13 DIAGNOSIS — D509 Iron deficiency anemia, unspecified: Secondary | ICD-10-CM

## 2018-09-17 ENCOUNTER — Other Ambulatory Visit (INDEPENDENT_AMBULATORY_CARE_PROVIDER_SITE_OTHER): Payer: PPO

## 2018-09-17 DIAGNOSIS — D509 Iron deficiency anemia, unspecified: Secondary | ICD-10-CM

## 2018-09-17 LAB — CBC WITH DIFFERENTIAL/PLATELET
Basophils Absolute: 0.1 10*3/uL (ref 0.0–0.1)
Basophils Relative: 1.1 % (ref 0.0–3.0)
Eosinophils Absolute: 0.6 10*3/uL (ref 0.0–0.7)
Eosinophils Relative: 9.1 % — ABNORMAL HIGH (ref 0.0–5.0)
HCT: 38.4 % (ref 36.0–46.0)
Hemoglobin: 12.2 g/dL (ref 12.0–15.0)
Lymphocytes Relative: 18.3 % (ref 12.0–46.0)
Lymphs Abs: 1.2 10*3/uL (ref 0.7–4.0)
MCHC: 31.7 g/dL (ref 30.0–36.0)
MCV: 84.8 fl (ref 78.0–100.0)
Monocytes Absolute: 0.7 10*3/uL (ref 0.1–1.0)
Monocytes Relative: 10 % (ref 3.0–12.0)
Neutro Abs: 4.2 10*3/uL (ref 1.4–7.7)
Neutrophils Relative %: 61.5 % (ref 43.0–77.0)
Platelets: 226 10*3/uL (ref 150.0–400.0)
RBC: 4.52 Mil/uL (ref 3.87–5.11)
RDW: 14.7 % (ref 11.5–15.5)
WBC: 6.8 10*3/uL (ref 4.0–10.5)

## 2018-09-19 DIAGNOSIS — Z961 Presence of intraocular lens: Secondary | ICD-10-CM | POA: Diagnosis not present

## 2018-09-19 DIAGNOSIS — H34832 Tributary (branch) retinal vein occlusion, left eye, with macular edema: Secondary | ICD-10-CM | POA: Diagnosis not present

## 2018-09-19 DIAGNOSIS — H43392 Other vitreous opacities, left eye: Secondary | ICD-10-CM | POA: Diagnosis not present

## 2018-09-19 DIAGNOSIS — H353112 Nonexudative age-related macular degeneration, right eye, intermediate dry stage: Secondary | ICD-10-CM | POA: Diagnosis not present

## 2018-09-19 DIAGNOSIS — H31092 Other chorioretinal scars, left eye: Secondary | ICD-10-CM | POA: Diagnosis not present

## 2018-09-19 DIAGNOSIS — H4052X2 Glaucoma secondary to other eye disorders, left eye, moderate stage: Secondary | ICD-10-CM | POA: Diagnosis not present

## 2018-09-19 DIAGNOSIS — H35363 Drusen (degenerative) of macula, bilateral: Secondary | ICD-10-CM | POA: Diagnosis not present

## 2018-09-19 DIAGNOSIS — H35373 Puckering of macula, bilateral: Secondary | ICD-10-CM | POA: Diagnosis not present

## 2018-10-01 ENCOUNTER — Telehealth: Payer: Self-pay

## 2018-10-01 NOTE — Telephone Encounter (Signed)
Thank you. Please have her take what she is currently prescribed.

## 2018-10-01 NOTE — Telephone Encounter (Signed)
Called pt - stated the pharmacy told her Vit D is good for her bones. Informed she should be taking Citracal+D as prescribed. Stated she wanted to be sure she didn't need to take another vit d pill.

## 2018-10-01 NOTE — Telephone Encounter (Signed)
calcium citrate-vitamin D (CITRACAL+D) 315-200 MG-UNIT tablet, refill request @  [facility] ([address] [place] - [place] [phone] (Phone) [phone] (Fax)

## 2018-10-22 ENCOUNTER — Other Ambulatory Visit: Payer: Self-pay

## 2018-10-22 ENCOUNTER — Other Ambulatory Visit: Payer: Self-pay | Admitting: Internal Medicine

## 2018-10-22 DIAGNOSIS — I10 Essential (primary) hypertension: Secondary | ICD-10-CM

## 2018-10-22 NOTE — Telephone Encounter (Signed)
Request already sent to pcp

## 2018-10-22 NOTE — Telephone Encounter (Signed)
carvedilol (COREG) 25 MG tablet   Refill request @  [facility] ([address] [place] - [place] [phone] (Phone) [phone] (Fax)

## 2018-11-05 ENCOUNTER — Other Ambulatory Visit: Payer: Self-pay | Admitting: Internal Medicine

## 2018-11-05 DIAGNOSIS — I10 Essential (primary) hypertension: Secondary | ICD-10-CM

## 2018-11-05 MED ORDER — LOSARTAN POTASSIUM 50 MG PO TABS: 50.0000 mg | ORAL_TABLET | Freq: Every day | ORAL | 1 refills | Status: DC

## 2018-11-05 NOTE — Telephone Encounter (Signed)
Needs refill on   losartan (COZAAR) 50 MG tablet [facility] ([facility]), [place] - [place]    ;pt contact [phone]

## 2018-11-19 ENCOUNTER — Encounter: Payer: Self-pay | Admitting: Internal Medicine

## 2018-11-20 ENCOUNTER — Encounter: Payer: Self-pay | Admitting: Internal Medicine

## 2018-11-20 ENCOUNTER — Ambulatory Visit (INDEPENDENT_AMBULATORY_CARE_PROVIDER_SITE_OTHER): Payer: PPO | Admitting: Internal Medicine

## 2018-11-20 ENCOUNTER — Other Ambulatory Visit: Payer: Self-pay

## 2018-11-20 VITALS — BP 149/90 | HR 67 | Temp 97.6°F | Ht 62.0 in | Wt 245.2 lb

## 2018-11-20 DIAGNOSIS — R911 Solitary pulmonary nodule: Secondary | ICD-10-CM

## 2018-11-20 DIAGNOSIS — Z6841 Body Mass Index (BMI) 40.0 and over, adult: Secondary | ICD-10-CM

## 2018-11-20 DIAGNOSIS — E039 Hypothyroidism, unspecified: Secondary | ICD-10-CM

## 2018-11-20 DIAGNOSIS — Z79899 Other long term (current) drug therapy: Secondary | ICD-10-CM

## 2018-11-20 DIAGNOSIS — K5520 Angiodysplasia of colon without hemorrhage: Secondary | ICD-10-CM | POA: Diagnosis not present

## 2018-11-20 DIAGNOSIS — M5441 Lumbago with sciatica, right side: Secondary | ICD-10-CM

## 2018-11-20 DIAGNOSIS — K219 Gastro-esophageal reflux disease without esophagitis: Secondary | ICD-10-CM

## 2018-11-20 DIAGNOSIS — I10 Essential (primary) hypertension: Secondary | ICD-10-CM | POA: Diagnosis not present

## 2018-11-20 DIAGNOSIS — R011 Cardiac murmur, unspecified: Secondary | ICD-10-CM

## 2018-11-20 DIAGNOSIS — Z79891 Long term (current) use of opiate analgesic: Secondary | ICD-10-CM

## 2018-11-20 DIAGNOSIS — D509 Iron deficiency anemia, unspecified: Secondary | ICD-10-CM

## 2018-11-20 DIAGNOSIS — R7303 Prediabetes: Secondary | ICD-10-CM | POA: Diagnosis not present

## 2018-11-20 DIAGNOSIS — R918 Other nonspecific abnormal finding of lung field: Secondary | ICD-10-CM

## 2018-11-20 DIAGNOSIS — J984 Other disorders of lung: Secondary | ICD-10-CM

## 2018-11-20 DIAGNOSIS — Z853 Personal history of malignant neoplasm of breast: Secondary | ICD-10-CM

## 2018-11-20 DIAGNOSIS — Z0000 Encounter for general adult medical examination without abnormal findings: Secondary | ICD-10-CM

## 2018-11-20 LAB — GLUCOSE, CAPILLARY: Glucose-Capillary: 116 mg/dL — ABNORMAL HIGH (ref 70–99)

## 2018-11-20 LAB — POCT GLYCOSYLATED HEMOGLOBIN (HGB A1C): Hemoglobin A1C: 6 % — AB (ref 4.0–5.6)

## 2018-11-20 MED ORDER — LOSARTAN POTASSIUM 100 MG PO TABS: 100.0000 mg | ORAL_TABLET | Freq: Every day | ORAL | 0 refills | Status: DC

## 2018-11-20 MED ORDER — ACETAMINOPHEN-CODEINE #3 300-30 MG PO TABS: 1.0000 | ORAL_TABLET | ORAL | 0 refills | Status: DC | PRN

## 2018-11-20 MED ORDER — SHINGRIX 50 MCG/0.5ML IM SUSR: 0.5000 mL | Freq: Once | INTRAMUSCULAR | 0 refills | Status: AC

## 2018-11-20 NOTE — Assessment & Plan Note (Signed)
-  Patient's A1c today was 6.0 -We talked about diet and exercise today and its importance in preventing her from developing diabetes -No further work-up at this time -We will continue to monitor annually

## 2018-11-20 NOTE — Assessment & Plan Note (Signed)
-  Patient had repeat CT chest done in January for follow-up of her pulmonary nodules -The nodules were stable and appeared benign on the CT -No further work-up at this time

## 2018-11-20 NOTE — Patient Instructions (Addendum)
-  It was a pleasure seeing you today -We will increase your losartan to 100 mg daily for your increased blood pressure -Please follow-up in 1 month for repeat blood work as well as repeat blood pressure check -I have also given you prescription for Shingrix.  Please let me know if you have any difficulties getting this vaccine -We will get you a flu shot today -We will also check some blood work on you today -I will also refill your Tylenol 3 -Please call if you have any questions or if you need any refills

## 2018-11-20 NOTE — Assessment & Plan Note (Addendum)
-  This problem is chronic and stable -Patient last hemoglobin was within normal limits -Patient takes iron tablets every other day for her iron deficiency anemia -EGD and colonoscopy done this year showed no evidence of malignancy.  She did have small AVM in the duodenum but no active bleeding -We will continue to monitor her CBCs and continue with iron tablets every other day for now -We will recheck iron studies as well as ferritin at her next visit -We will follow-up CBC today -No further work-up at this time

## 2018-11-20 NOTE — Assessment & Plan Note (Signed)
-  This problem is chronic and stable -Patient's weight is down 2 pounds from her last visit -Patient states that she is attempting to eat better but is not able to exercise much as she does not go out as much secondary to the [place] pandemic -We discussed various diet strategies and will follow up with her at her next visit -We will consider referral to [facility] for weight loss counseling

## 2018-11-20 NOTE — Assessment & Plan Note (Signed)
-  This problem is chronic and stable -Patient denies any symptoms of hypothyroidism currently -Will check repeat TSH today -No further work-up at this time

## 2018-11-20 NOTE — Progress Notes (Signed)
   Subjective:    Patient ID: [name], female    DOB: [birth year], 83 y.o.   MRN: [id number]  HPI  I have seen and examined this patient.  Patient is here for routine follow-up of her hypertension and hypothyroidism.  Patient states that she feels well today and denies any new complaints.  She did state that a nurse came by to visit her in her house and that she was due for flu shot as well as shingles shot.  She states that she is compliant with her medications.  Review of Systems  Constitutional: Negative.   HENT: Negative.   Respiratory: Negative.   Cardiovascular: Negative.   Gastrointestinal: Negative.   Musculoskeletal: Negative.   Neurological: Negative.   Psychiatric/Behavioral: Negative.        Objective:   Physical Exam Constitutional:      Appearance: Normal appearance.  HENT:     Head: Normocephalic and atraumatic.  Cardiovascular:     Rate and Rhythm: Normal rate and regular rhythm.     Heart sounds: Murmur present.     Comments: Systolic murmur noted on auscultation grade 2 out of 6 Pulmonary:     Effort: Pulmonary effort is normal.     Breath sounds: Normal breath sounds. No wheezing or rales.  Abdominal:     General: Bowel sounds are normal. There is no distension.     Palpations: Abdomen is soft.     Tenderness: There is no abdominal tenderness.  Musculoskeletal:        General: No swelling or tenderness.  Neurological:     General: No focal deficit present.     Mental Status: She is alert and oriented to person, place, and time.  Psychiatric:        Mood and Affect: Mood normal.        Behavior: Behavior normal.           Assessment & Plan:  Please see problem based charting for assessment and plan:

## 2018-11-20 NOTE — Assessment & Plan Note (Signed)
-  Patient given a flu shot today -Patient also given a prescription for the Shingrix vaccine

## 2018-11-20 NOTE — Assessment & Plan Note (Signed)
-  This problem is chronic and stable -Patient states that her shortness of breath has improved and that she only needs to use her albuterol inhaler once every 2 to 3 days -She takes it right before she needs to go walking outside and states that this is helped improve her shortness of breath -We will continue to encourage weight loss to see if this will help with her shortness of breath -No further work-up for now

## 2018-11-20 NOTE — Assessment & Plan Note (Signed)
-  Patient has a history of breast cancer and [facility] -Our last mammogram for this patient was in 2016 -We will discuss with patient on whether she is continue to follow-up with [facility] as well as whether she needs annual mammograms at our next visit

## 2018-11-20 NOTE — Assessment & Plan Note (Signed)
-  This problem is chronic and stable -Patient is Tylenol 3 only sparingly for severe back pain -I will refill the medication for her today -Currently the benefits of his medication outweigh the risks -[place] narcotic database was reviewed and is appropriate -Patient has no red flag symptoms -No further work-up at this time

## 2018-11-20 NOTE — Assessment & Plan Note (Addendum)
BP Readings from Last 3 Encounters:  11/20/18 (!) 149/90  09/04/18 (!) 161/93  05/15/18 (!) 157/74    Lab Results  Component Value Date   NA 142 05/15/2018   K 4.1 05/15/2018   CREATININE 0.93 05/15/2018    Assessment: Blood pressure control:  Fair Progress toward BP goal:   Improved Comments: Patient is compliant with losartan 50 mg daily, carvedilol 25 mg twice daily and amlodipine 5 mg daily  Plan: Medications:  We will increase her losartan to 100 mg daily Educational resources provided:   Self management tools provided:   Other plans: We will check a BMP today.  We will have patient follow-up in 4 weeks for repeat BP check as well as repeat BMP

## 2018-11-20 NOTE — Assessment & Plan Note (Addendum)
-  Patient states that her reflux symptoms are well controlled -We will continue with pantoprazole 40 mg daily -She does complain of occasional gas pain and states that this is relieved by taking Tums. -Patient had a recent EGD without any evidence of acute pathology or stricture -Will start the patient on over-the-counter simethicone as needed for this -We will follow-up in 1 month

## 2018-11-21 ENCOUNTER — Telehealth: Payer: Self-pay | Admitting: Internal Medicine

## 2018-11-21 LAB — BMP8+ANION GAP
Anion Gap: 16 mmol/L (ref 10.0–18.0)
BUN/Creatinine Ratio: 15 (ref 12–28)
BUN: 16 mg/dL (ref 8–27)
CO2: 21 mmol/L (ref 20–29)
Calcium: 9.6 mg/dL (ref 8.7–10.3)
Chloride: 101 mmol/L (ref 96–106)
Creatinine, Ser: 1.05 mg/dL — ABNORMAL HIGH (ref 0.57–1.00)
GFR calc Af Amer: 58 mL/min/{1.73_m2} — ABNORMAL LOW (ref >59)
GFR calc non Af Amer: 50 mL/min/{1.73_m2} — ABNORMAL LOW (ref >59)
Glucose: 117 mg/dL — ABNORMAL HIGH (ref 65–99)
Potassium: 4.1 mmol/L (ref 3.5–5.2)
Sodium: 138 mmol/L (ref 134–144)

## 2018-11-21 LAB — TSH: TSH: 3.86 u[IU]/mL (ref 0.450–4.500)

## 2018-11-21 LAB — CBC WITH DIFFERENTIAL/PLATELET
Basophils Absolute: 0.1 10*3/uL (ref 0.0–0.2)
Basos: 1 %
EOS (ABSOLUTE): 0.4 10*3/uL (ref 0.0–0.4)
Eos: 6 %
Hematocrit: 36.8 % (ref 34.0–46.6)
Hemoglobin: 11.8 g/dL (ref 11.1–15.9)
Immature Grans (Abs): 0 10*3/uL (ref 0.0–0.1)
Immature Granulocytes: 0 %
Lymphocytes Absolute: 1.3 10*3/uL (ref 0.7–3.1)
Lymphs: 19 %
MCH: 27.2 pg (ref 26.6–33.0)
MCHC: 32.1 g/dL (ref 31.5–35.7)
MCV: 85 fL (ref 79–97)
Monocytes Absolute: 0.7 10*3/uL (ref 0.1–0.9)
Monocytes: 11 %
Neutrophils Absolute: 4.1 10*3/uL (ref 1.4–7.0)
Neutrophils: 63 %
Platelets: 206 10*3/uL (ref 150–450)
RBC: 4.34 x10E6/uL (ref 3.77–5.28)
RDW: 13.7 % (ref 11.7–15.4)
WBC: 6.6 10*3/uL (ref 3.4–10.8)

## 2018-11-21 NOTE — Telephone Encounter (Signed)
I called the patient discussed the results of her blood work with her.  I explained to the patient that her creatinine was mildly elevated compared to normal (creatinine 1.05 with an upper limit of normal of 1.00).  I asked the patient to keep yourself hydrated and that we will recheck this at her next visit.  I also told her that her TSH was within normal limits as well as her CBC.  No further work-up at this time.  Patient expresses understanding and is in agreement with plan.

## 2018-12-01 ENCOUNTER — Encounter (HOSPITAL_COMMUNITY): Payer: Self-pay

## 2018-12-01 ENCOUNTER — Emergency Department (HOSPITAL_COMMUNITY): Payer: PPO

## 2018-12-01 ENCOUNTER — Other Ambulatory Visit: Payer: Self-pay

## 2018-12-01 ENCOUNTER — Emergency Department (HOSPITAL_COMMUNITY)
Admission: EM | Admit: 2018-12-01 | Discharge: 2018-12-01 | Disposition: A | Discharge status: Home / Self Care | Payer: PPO | Attending: Emergency Medicine | Admitting: Emergency Medicine

## 2018-12-01 DIAGNOSIS — I10 Essential (primary) hypertension: Secondary | ICD-10-CM | POA: Diagnosis not present

## 2018-12-01 DIAGNOSIS — Y929 Unspecified place or not applicable: Secondary | ICD-10-CM | POA: Diagnosis not present

## 2018-12-01 DIAGNOSIS — Z853 Personal history of malignant neoplasm of breast: Secondary | ICD-10-CM | POA: Insufficient documentation

## 2018-12-01 DIAGNOSIS — Z87891 Personal history of nicotine dependence: Secondary | ICD-10-CM | POA: Diagnosis not present

## 2018-12-01 DIAGNOSIS — J45909 Unspecified asthma, uncomplicated: Secondary | ICD-10-CM | POA: Insufficient documentation

## 2018-12-01 DIAGNOSIS — Z7982 Long term (current) use of aspirin: Secondary | ICD-10-CM | POA: Insufficient documentation

## 2018-12-01 DIAGNOSIS — S73006A Unspecified dislocation of unspecified hip, initial encounter: Secondary | ICD-10-CM

## 2018-12-01 DIAGNOSIS — S79911A Unspecified injury of right hip, initial encounter: Secondary | ICD-10-CM | POA: Diagnosis present

## 2018-12-01 DIAGNOSIS — Y939 Activity, unspecified: Secondary | ICD-10-CM | POA: Diagnosis not present

## 2018-12-01 DIAGNOSIS — S73004A Unspecified dislocation of right hip, initial encounter: Secondary | ICD-10-CM | POA: Diagnosis not present

## 2018-12-01 DIAGNOSIS — X58XXXA Exposure to other specified factors, initial encounter: Secondary | ICD-10-CM | POA: Insufficient documentation

## 2018-12-01 DIAGNOSIS — M25551 Pain in right hip: Secondary | ICD-10-CM | POA: Diagnosis not present

## 2018-12-01 DIAGNOSIS — Z96649 Presence of unspecified artificial hip joint: Secondary | ICD-10-CM | POA: Diagnosis not present

## 2018-12-01 DIAGNOSIS — Z79899 Other long term (current) drug therapy: Secondary | ICD-10-CM | POA: Diagnosis not present

## 2018-12-01 DIAGNOSIS — R0902 Hypoxemia: Secondary | ICD-10-CM | POA: Diagnosis not present

## 2018-12-01 DIAGNOSIS — T84020A Dislocation of internal right hip prosthesis, initial encounter: Secondary | ICD-10-CM | POA: Diagnosis not present

## 2018-12-01 DIAGNOSIS — Y998 Other external cause status: Secondary | ICD-10-CM | POA: Insufficient documentation

## 2018-12-01 DIAGNOSIS — E039 Hypothyroidism, unspecified: Secondary | ICD-10-CM | POA: Diagnosis not present

## 2018-12-01 DIAGNOSIS — M25572 Pain in left ankle and joints of left foot: Secondary | ICD-10-CM | POA: Diagnosis not present

## 2018-12-01 DIAGNOSIS — R52 Pain, unspecified: Secondary | ICD-10-CM | POA: Diagnosis not present

## 2018-12-01 LAB — CBC WITH DIFFERENTIAL/PLATELET
Abs Immature Granulocytes: 0.05 10*3/uL (ref 0.00–0.07)
Basophils Absolute: 0.1 10*3/uL (ref 0.0–0.1)
Basophils Relative: 1 %
Eosinophils Absolute: 0.3 10*3/uL (ref 0.0–0.5)
Eosinophils Relative: 3 %
HCT: 39.5 % (ref 36.0–46.0)
Hemoglobin: 12.1 g/dL (ref 12.0–15.0)
Immature Granulocytes: 1 %
Lymphocytes Relative: 13 %
Lymphs Abs: 1.2 10*3/uL (ref 0.7–4.0)
MCH: 26.9 pg (ref 26.0–34.0)
MCHC: 30.6 g/dL (ref 30.0–36.0)
MCV: 88 fL (ref 80.0–100.0)
Monocytes Absolute: 0.5 10*3/uL (ref 0.1–1.0)
Monocytes Relative: 6 %
Neutro Abs: 7.1 10*3/uL (ref 1.7–7.7)
Neutrophils Relative %: 76 %
Platelets: 229 10*3/uL (ref 150–400)
RBC: 4.49 MIL/uL (ref 3.87–5.11)
RDW: 14.2 % (ref 11.5–15.5)
WBC: 9.2 10*3/uL (ref 4.0–10.5)
nRBC: 0 % (ref 0.0–0.2)

## 2018-12-01 LAB — BASIC METABOLIC PANEL
Anion gap: 12 (ref 5–15)
BUN: 17 mg/dL (ref 8–23)
CO2: 24 mmol/L (ref 22–32)
Calcium: 9.4 mg/dL (ref 8.9–10.3)
Chloride: 102 mmol/L (ref 98–111)
Creatinine, Ser: 0.89 mg/dL (ref 0.44–1.00)
GFR calc Af Amer: >60 mL/min (ref >60)
GFR calc non Af Amer: >60 mL/min (ref >60)
Glucose, Bld: 127 mg/dL — ABNORMAL HIGH (ref 70–99)
Potassium: 4.1 mmol/L (ref 3.5–5.1)
Sodium: 138 mmol/L (ref 135–145)

## 2018-12-01 MED ORDER — KETAMINE HCL 50 MG/5ML IJ SOSY
1.0000 mg/kg | PREFILLED_SYRINGE | Freq: Once | INTRAMUSCULAR | Status: AC
  Administered 2018-12-01: 16:00:00 50 mg via INTRAVENOUS
  Filled 2018-12-01: qty 15

## 2018-12-01 MED ORDER — PROPOFOL 10 MG/ML IV BOLUS
1.0000 mg/kg | Freq: Once | INTRAVENOUS | Status: AC
  Administered 2018-12-01: 60 mg via INTRAVENOUS
  Filled 2018-12-01: qty 20

## 2018-12-01 MED ORDER — FENTANYL CITRATE (PF) 100 MCG/2ML IJ SOLN
50.0000 ug | INTRAMUSCULAR | Status: DC | PRN
  Administered 2018-12-01: 50 ug via INTRAVENOUS
  Filled 2018-12-01: qty 2

## 2018-12-01 MED ORDER — ACETAMINOPHEN ER 650 MG PO TBCR: 650.0000 mg | EXTENDED_RELEASE_TABLET | Freq: Three times a day (TID) | ORAL | 0 refills | Status: DC | PRN

## 2018-12-01 NOTE — Discharge Instructions (Signed)
You are seen in the ER for hip pain.  Your hip was dislocated and it was reduced in the emergency room.  Follow-up with the orthopedic doctor in 7 to 10 days. Read the instructions provided.  Be careful with any kind of activity for the next 24 to 48 hours.

## 2018-12-01 NOTE — ED Provider Notes (Addendum)
[facility] DEPT Provider Note   CSN: [id number] Arrival date & time: 12/01/18  1342     History   Chief Complaint Chief Complaint  Patient presents with  . Conscious Sedation  . Hip Pain    [name] is a 83 y.o. female.     HPI  83 year old comes in a chief complaint of hip pain.  She has history of right hip dislocation and reports that she was sitting when she heard a pop and had severe pain.  Injury occurred prior to ED arrival.  She is not on any blood thinners and denies any head trauma, headache or pain elsewhere.  She also denies any numbness or tingling.  Past Medical History:  Diagnosis Date  . Allergic rhinitis, cause unspecified   . Asthma   . Cancer ([place])   . Chronic back pain   . Coronary artery calcification seen on CAT scan 04/21/2017  . Enlarged pulmonary artery ([place]) 04/21/2017   By chest CT  . Frequent urination 11/14/2017  . GERD (gastroesophageal reflux disease) 05/19/2011  . history of Bilateral leg edema-likely amlodipine induced. 07/28/2011  . history of CAP (community acquired pneumonia) 07/14/2011  . history of HYPOTHYROIDISM, BORDERLINE 03/20/2006   Annotation: Asymptomatic, untreated Qualifier: Diagnosis of  By: Tomasa Hosteller MD, Edmon Crape.   . Hx of breast cancer   . Hyperlipidemia   . Hypertension   . Hypothyroidism   . Lumbago   . Murmur, cardiac 11/22/2016  . OBESITY NOS 03/20/2006   Qualifier: Diagnosis of  By: Tomasa Hosteller MD, [place] 12/13/2005   Annotation: bilateral knees;right knee injection 6/05 Qualifier: Diagnosis of  By: Tomasa Hosteller MD, Veronique D.   . right shoulder pain, likely Impingement syndrome  09/09/2010  . VENOUS INSUFFICIENCY, CHRONIC 03/20/2006   Qualifier: Diagnosis of  By: Tomasa Hosteller MD, Edmon Crape.     Patient Active Problem List   Diagnosis Date Noted  . Anemia 04/09/2018  . Syncope 03/31/2018  . Pulmonary nodule 05/30/2017  . Coronary artery calcification seen on  CAT scan 04/21/2017  . Enlarged pulmonary artery ([place]) 04/21/2017  . Restrictive lung disease 11/22/2016  . Preventative health care 09/04/2014  . Borderline diabetes 09/19/2013  . Hypertensive retinopathy 03/29/2013  . Hypokalemia 06/27/2011  . GERD (gastroesophageal reflux disease) 05/19/2011  . Hyperlipidemia 05/02/2007  . ALLERGIC RHINITIS 09/06/2006  . Hypothyroidism 03/20/2006  . Severe obesity (BMI >= 40) ([place]) 03/20/2006  . Chronic venous insufficiency 03/20/2006  . Right-sided low back pain with right-sided sciatica 03/20/2006  . BREAST CANCER, HX OF 03/20/2006  . Essential hypertension 12/13/2005  . Osteoarthritis 12/13/2005    Past Surgical History:  Procedure Laterality Date  . ABDOMINAL HYSTERECTOMY    . BREAST LUMPECTOMY    . COLONOSCOPY    . HERNIA REPAIR    . HIP CLOSED REDUCTION Right 11/26/2014   Procedure: CLOSED REDUCTIION RIGHT HIP;  Surgeon: Newt Minion, MD;  Location: [facility];  Service: Orthopedics;  Laterality: Right;  . INCISIONAL HERNIA REPAIR N/A 10/29/2013   Procedure: OPEN REPAIR OF RECURRENT INCISIONAL HERNIA WITH MESH;  Surgeon: Zenovia Jarred, MD;  Location: [facility];  Service: General;  Laterality: N/A;  . JOINT REPLACEMENT    . TOTAL HIP ARTHROPLASTY       OB History   No obstetric history on file.      Home Medications    Prior to Admission medications   Medication Sig Start Date End Date Taking? Authorizing Provider  acetaminophen (TYLENOL 8 HOUR) 650 MG CR tablet Take 1 tablet (650 mg total) by mouth every 8 (eight) hours as needed. 12/01/18   Varney Biles, MD  acetaminophen-codeine (TYLENOL #3) 300-30 MG tablet Take 1 tablet by mouth every 4 (four) hours as needed for moderate pain. DIAG CODE M54.41 11/20/18   Aldine Contes, MD  albuterol (PROAIR HFA) 108 (90 Base) MCG/ACT inhaler Inhale 2 puffs into the lungs every 4 (four) hours as needed for wheezing or shortness of breath. 09/05/17   Aldine Contes, MD  amLODipine (NORVASC) 5  MG tablet Take 1 tablet (5 mg total) by mouth daily. 07/28/18   Aldine Contes, MD  aspirin EC 81 MG tablet Take 81 mg by mouth daily.    [provider]  atorvastatin (LIPITOR) 40 MG tablet Take 1 tablet (40 mg total) by mouth daily. 08/13/18   Aldine Contes, MD  calcium citrate-vitamin D (CITRACAL+D) 315-200 MG-UNIT tablet Take 1 tablet by mouth 2 (two) times daily. 05/15/18   Aldine Contes, MD  carvedilol (COREG) 25 MG tablet TAKE 1 TABLET BY MOUTH TWICE DAILY WITH A MEAL 10/22/18   Aldine Contes, MD  Cyanocobalamin (VITAMIN B12 PO) Take 1 tablet daily by mouth.    [provider]  diclofenac sodium (VOLTAREN) 1 % GEL Apply 2 g topically as needed. Patient taking differently: Apply 2 g topically as needed (joint pain).  09/05/17   Aldine Contes, MD  ferrous sulfate 325 (65 FE) MG tablet Take 1 tablet (325 mg total) by mouth daily. Patient taking differently: Take 325 mg by mouth every other day.  04/10/18 04/10/19  Velna Ochs, MD  losartan (COZAAR) 100 MG tablet Take 1 tablet (100 mg total) by mouth daily. 11/20/18   Aldine Contes, MD  pantoprazole (PROTONIX) 40 MG tablet Take 1 tablet (40 mg total) by mouth daily. 08/13/18   Aldine Contes, MD    Family History Family History  Problem Relation Age of Onset  . Hypertension Mother   . Alzheimer's disease Mother   . Diabetes Sister   . Colon cancer Neg Hx   . Colon polyps Neg Hx   . Esophageal cancer Neg Hx   . Stomach cancer Neg Hx   . Rectal cancer Neg Hx     Social History Social History   Tobacco Use  . Smoking status: Former Smoker    Packs/day: 0.25    Years: 0.50    Pack years: 0.12    Types: Cigarettes    Quit date: 09/09/1958    Years since quitting: 60.2  . Smokeless tobacco: Never Used  . Tobacco comment: social smoker  Substance Use Topics  . Alcohol use: No    Alcohol/week: 0.0 standard drinks  . Drug use: No     Allergies   Acyclovir and related, Metoprolol, and Food    Review of Systems Review of Systems  Constitutional: Positive for activity change.  Cardiovascular: Negative for chest pain.  Gastrointestinal: Negative for abdominal pain.  Musculoskeletal: Positive for arthralgias. Negative for back pain.  Neurological: Negative for numbness.  Hematological: Does not bruise/bleed easily.     Physical Exam Updated Vital Signs BP (!) 169/90   Pulse 69   Temp 97.6 F (36.4 C) (Oral)   Resp 20   Ht 5\' 2"  (1.575 m)   Wt 112.5 kg   SpO2 96%   BMI 45.36 kg/m   Physical Exam Vitals signs and nursing note reviewed.  Constitutional:      Appearance: She is well-developed.  HENT:     Head: Normocephalic and atraumatic.  Eyes:     Pupils: Pupils are equal, round, and reactive to light.  Neck:     Musculoskeletal: Neck supple.  Cardiovascular:     Rate and Rhythm: Normal rate and regular rhythm.     Heart sounds: Normal heart sounds. No murmur.  Pulmonary:     Effort: Pulmonary effort is normal. No respiratory distress.  Abdominal:     General: There is no distension.     Palpations: Abdomen is soft.     Tenderness: There is no abdominal tenderness. There is no guarding or rebound.  Musculoskeletal:     Comments: Shortened R leg, R hip tenderness  Skin:    General: Skin is warm and dry.  Neurological:     Mental Status: She is alert and oriented to person, place, and time.      ED Treatments / Results  Labs (all labs ordered are listed, but only abnormal results are displayed) Labs Reviewed  BASIC METABOLIC PANEL - Abnormal; Notable for the following components:      Result Value   Glucose, Bld 127 (*)    All other components within normal limits  CBC WITH DIFFERENTIAL/PLATELET    EKG None  Radiology Dg Hip Port Unilat With Pelvis 1v Right  Result Date: 12/01/2018 CLINICAL DATA:  Status post relocation of right hip. EXAM: DG HIP (WITH OR WITHOUT PELVIS) 1V PORT RIGHT COMPARISON:  Earlier today FINDINGS: Status post right  hip arthroplasty. There is been interval relocation of the right femoral component with respect to the acetabular cup. No fracture identified. IMPRESSION: 1. Interval relocation of right hip. Electronically Signed   By: Kerby Moors M.D.   On: 12/01/2018 18:24   Dg Hip Unilat W Or Wo Pelvis 2-3 Views Right  Result Date: 12/01/2018 CLINICAL DATA:  Right hip dislocation EXAM: DG HIP (WITH OR WITHOUT PELVIS) 2-3V RIGHT COMPARISON:  01/28/2016, FINDINGS: Pubic symphysis and rami appear intact. Left femoral head projects in joint. Status post right hip replacement. Superior and lateral dislocation of the right femoral component with respect to the acetabular cup. No definitive fracture is seen. IMPRESSION: Status post right hip replacement with superior and lateral dislocation of the right femoral component with respect to the acetabular cup. No definite fracture is seen. Electronically Signed   By: Donavan Foil M.D.   On: 12/01/2018 16:01    Procedures .Sedation  Date/Time: 12/01/2018 6:07 PM Performed by: Varney Biles, MD Authorized by: Varney Biles, MD   Consent:    Consent obtained:  Written   Consent given by:  Patient   Risks discussed:  Allergic reaction, dysrhythmia, inadequate sedation, nausea, prolonged hypoxia resulting in organ damage, prolonged sedation necessitating reversal, respiratory compromise necessitating ventilatory assistance and intubation and vomiting   Alternatives discussed:  Analgesia without sedation, anxiolysis and regional anesthesia Universal protocol:    Procedure explained and questions answered to patient or proxy's satisfaction: yes     Relevant documents present and verified: yes     Test results available and properly labeled: yes     Imaging studies available: yes     Required blood products, implants, devices, and special equipment available: yes     Site/side marked: yes     Immediately prior to procedure a time out was called: yes     Patient  identity confirmation method:  Verbally with patient and arm band Indications:    Procedure necessitating sedation performed by:  Physician performing sedation  Pre-sedation assessment:    Time since last food or drink:  6 hours   ASA classification: class 3 - patient with severe systemic disease     Neck mobility: normal     Mouth opening:  3 or more finger widths   Thyromental distance:  4 finger widths   Mallampati score:  I - soft palate, uvula, fauces, pillars visible   Pre-sedation assessments completed and reviewed: airway patency, cardiovascular function, hydration status, mental status, nausea/vomiting, pain level, respiratory function and temperature     Pre-sedation assessment completed:  12/01/2018 4:28 PM Immediate pre-procedure details:    Reassessment: Patient reassessed immediately prior to procedure     Reviewed: vital signs, relevant labs/tests and NPO status     Verified: bag valve mask available, emergency equipment available, intubation equipment available, IV patency confirmed, oxygen available and suction available   Procedure details (see MAR for exact dosages):    Preoxygenation:  Nasal cannula   Sedation:  Propofol   Intra-procedure monitoring:  Blood pressure monitoring, cardiac monitor, continuous pulse oximetry, frequent LOC assessments, frequent vital sign checks and continuous capnometry   Intra-procedure events: none     Total Provider sedation time (minutes):  32 Post-procedure details:    Post-sedation assessment completed:  12/01/2018 6:09 PM   Attendance: Constant attendance by certified staff until patient recovered     Recovery: Patient returned to pre-procedure baseline     Post-sedation assessments completed and reviewed: airway patency, cardiovascular function, hydration status, mental status, nausea/vomiting, pain level, respiratory function and temperature     Patient is stable for discharge or admission: yes     Patient tolerance:  Tolerated well,  no immediate complications Reduction of dislocation  Date/Time: 12/01/2018 6:09 PM Performed by: Varney Biles, MD Authorized by: Varney Biles, MD  Consent: Written consent obtained. Risks and benefits: risks, benefits and alternatives were discussed Consent given by: patient Patient understanding: patient states understanding of the procedure being performed Patient consent: the patient's understanding of the procedure matches consent given Procedure consent: procedure consent matches procedure scheduled Relevant documents: relevant documents present and verified Test results: test results available and properly labeled Site marked: the operative site was marked Imaging studies: imaging studies available Patient identity confirmed: arm band Time out: Immediately prior to procedure a "time out" was called to verify the correct patient, procedure, equipment, support staff and site/side marked as required. Preparation: Patient was prepped and draped in the usual sterile fashion. Local anesthesia used: no  Anesthesia: Local anesthesia used: no Patient tolerance: patient tolerated the procedure well with no immediate complications    (including critical care time)  Medications Ordered in ED Medications  fentaNYL (SUBLIMAZE) injection 50 mcg (50 mcg Intravenous Given 12/01/18 1610)  ketamine 50 mg in normal saline 5 mL (10 mg/mL) syringe (50 mg Intravenous Given 12/01/18 1628)  propofol (DIPRIVAN) 10 mg/mL bolus/IV push 112.5 mg (60 mg Intravenous Given 12/01/18 1630)     Initial Impression / Assessment and Plan / ED Course  I have reviewed the triage vital signs and the nursing notes.  Pertinent labs & imaging results that were available during my care of the patient were reviewed by me and considered in my medical decision making (see chart for details).  Clinical Course as of Nov 30 1905  Sat Dec 01, 2018  1907 Patient reassessed.  She is feeling a lot better and wants to go  home.  Stable for discharge.   [AN]    Clinical Course User Index [AN]  Varney Biles, MD       DDx includes: - R hip dislocation  Neurovascularly intact. We will reduce.  7:07 PM Hip was successfully reduced.  Patient reassessed at 6:09 PM and she is still a little groggy. We will discharge when she is stable.  Headache  Final Clinical Impressions(s) / ED Diagnoses   Final diagnoses:  Hip dislocation, right, initial encounter [facility])    ED Discharge Orders         Ordered    acetaminophen (TYLENOL 8 HOUR) 650 MG CR tablet  Every 8 hours PRN     12/01/18 1811           Varney Biles, MD 12/01/18 1811    Varney Biles, MD 12/01/18 1907

## 2018-12-01 NOTE — ED Triage Notes (Signed)
Pt arrives [facility] from home for hip displacement. Pt has a hx of hip replacement sx, and has had it "pop out" several times, with this occurrence happening when she stood up too fast. Pt had 150 mcg Fentanyl PTA with moderate relief.

## 2018-12-01 NOTE — ED Notes (Signed)
Unable to draw labs, unable to pull back from the IV also

## 2018-12-18 ENCOUNTER — Encounter: Payer: Self-pay | Admitting: Internal Medicine

## 2018-12-18 ENCOUNTER — Ambulatory Visit (INDEPENDENT_AMBULATORY_CARE_PROVIDER_SITE_OTHER): Payer: PPO | Admitting: Internal Medicine

## 2018-12-18 ENCOUNTER — Other Ambulatory Visit: Payer: Self-pay

## 2018-12-18 VITALS — BP 144/84 | HR 66 | Temp 98.4°F | Wt 242.9 lb

## 2018-12-18 DIAGNOSIS — R011 Cardiac murmur, unspecified: Secondary | ICD-10-CM

## 2018-12-18 DIAGNOSIS — Z79899 Other long term (current) drug therapy: Secondary | ICD-10-CM

## 2018-12-18 DIAGNOSIS — I10 Essential (primary) hypertension: Secondary | ICD-10-CM | POA: Diagnosis not present

## 2018-12-18 DIAGNOSIS — K219 Gastro-esophageal reflux disease without esophagitis: Secondary | ICD-10-CM | POA: Diagnosis not present

## 2018-12-18 MED ORDER — ACETAMINOPHEN ER 650 MG PO TBCR: 650.0000 mg | EXTENDED_RELEASE_TABLET | Freq: Three times a day (TID) | ORAL | 2 refills | Status: DC | PRN

## 2018-12-18 NOTE — Progress Notes (Signed)
   CC: High blood pressure  HPI:[name] is a 83 y.o. with past medical history listed below.  Please see problem based charting for details of presentation, assessment, and plan.  Past Medical History:  Diagnosis Date  . Allergic rhinitis, cause unspecified   . Asthma   . Cancer ([place])   . Chronic back pain   . Coronary artery calcification seen on CAT scan 04/21/2017  . Enlarged pulmonary artery ([place]) 04/21/2017   By chest CT  . Frequent urination 11/14/2017  . GERD (gastroesophageal reflux disease) 05/19/2011  . history of Bilateral leg edema-likely amlodipine induced. 07/28/2011  . history of CAP (community acquired pneumonia) 07/14/2011  . history of HYPOTHYROIDISM, BORDERLINE 03/20/2006   Annotation: Asymptomatic, untreated Qualifier: Diagnosis of  By: Tomasa Hosteller MD, Edmon Crape.   . Hx of breast cancer   . Hyperlipidemia   . Hypertension   . Hypothyroidism   . Lumbago   . Murmur, cardiac 11/22/2016  . OBESITY NOS 03/20/2006   Qualifier: Diagnosis of  By: Tomasa Hosteller MD, [place] 12/13/2005   Annotation: bilateral knees;right knee injection 6/05 Qualifier: Diagnosis of  By: Tomasa Hosteller MD, Veronique D.   . right shoulder pain, likely Impingement syndrome  09/09/2010  . VENOUS INSUFFICIENCY, CHRONIC 03/20/2006   Qualifier: Diagnosis of  By: Tomasa Hosteller MD, Edmon Crape.    Review of Systems:  Review of Systems  Constitutional: Negative for chills and fever.  Neurological: Negative for dizziness and headaches.       Vitals:   12/18/18 1009  BP: (!) 170/90  Pulse: 66  Temp: 98.4 F (36.9 C)  TempSrc: Oral  SpO2: 99%  Weight: 242 lb 14.4 oz (110.2 kg)  Physical Exam: Physical Exam Constitutional:      Appearance: Normal appearance.  Cardiovascular:     Rate and Rhythm: Normal rate and regular rhythm.     Heart sounds: Murmur (3/6 systolic ejection murmur ) present.  Pulmonary:     Breath sounds: No wheezing, rhonchi or rales.  Abdominal:     General:  Bowel sounds are normal.     Tenderness: There is no guarding.  Neurological:     Mental Status: She is alert.  Psychiatric:        Mood and Affect: Mood normal.        Behavior: Behavior normal.      Assessment & Plan:   See Encounters Tab for problem based charting.  Patient seen with Dr. Rebeca Alert

## 2018-12-18 NOTE — Assessment & Plan Note (Signed)
Patient presents for follow-up after going up on Losartan from 50mg  > 100 mg. BP on manual check 144/84 and 144/82.  Patient reports SBP max 132 at home,usually in 120's. No symptoms of hypotension.  - Continue Losartan 100 mg - Continue Amlodipine 5 mg  - Continue Coreg 25 mg

## 2018-12-18 NOTE — Assessment & Plan Note (Signed)
Patient reports over the counter simethicone before meals have helped with gas pains. Continues to have normal bowel movements and reflux symptom under control. - Continue pantoprazole 40 mg - Continue simethicone as needed

## 2018-12-18 NOTE — Patient Instructions (Signed)
Thank you for trusting me with your care. To recap, today we discussed the following:   Blood pressure Manual BP checks today : 144/82 and 144/84 . Your home systolic blood pressure is no higher than 132. - Continue Losartan, Amlodipine, and Coreg  We will check lab work today and call if it is abnormal.   My best ,  Tamsen Snider, MD

## 2018-12-19 LAB — BMP8+ANION GAP
Anion Gap: 18 mmol/L (ref 10.0–18.0)
BUN/Creatinine Ratio: 19 (ref 12–28)
BUN: 18 mg/dL (ref 8–27)
CO2: 22 mmol/L (ref 20–29)
Calcium: 9.2 mg/dL (ref 8.7–10.3)
Chloride: 102 mmol/L (ref 96–106)
Creatinine, Ser: 0.94 mg/dL (ref 0.57–1.00)
GFR calc Af Amer: 66 mL/min/{1.73_m2} (ref >59)
GFR calc non Af Amer: 57 mL/min/{1.73_m2} — ABNORMAL LOW (ref >59)
Glucose: 108 mg/dL — ABNORMAL HIGH (ref 65–99)
Potassium: 3.6 mmol/L (ref 3.5–5.2)
Sodium: 142 mmol/L (ref 134–144)

## 2018-12-21 NOTE — Progress Notes (Signed)
Internal Medicine Clinic Attending  I saw and evaluated the patient.  I personally confirmed the key portions of the history and exam documented by Dr. Steen and I reviewed pertinent patient test results.  The assessment, diagnosis, and plan were formulated together and I agree with the documentation in the resident's note.  Alexander Raines, M.D., Ph.D.  

## 2019-01-16 ENCOUNTER — Other Ambulatory Visit: Payer: Self-pay | Admitting: Internal Medicine

## 2019-01-16 DIAGNOSIS — D649 Anemia, unspecified: Secondary | ICD-10-CM

## 2019-01-16 NOTE — Telephone Encounter (Signed)
Refill Request   ferrous sulfate 325 (65 FE) MG tablet  [id number] - [place] ([address]), [place]

## 2019-01-17 MED ORDER — FERROUS SULFATE 325 (65 FE) MG PO TABS: 325.0000 mg | ORAL_TABLET | Freq: Every day | ORAL | 1 refills | Status: DC

## 2019-01-21 ENCOUNTER — Other Ambulatory Visit: Payer: Self-pay

## 2019-01-21 DIAGNOSIS — I10 Essential (primary) hypertension: Secondary | ICD-10-CM

## 2019-01-21 MED ORDER — CARVEDILOL 25 MG PO TABS: ORAL_TABLET | ORAL | 0 refills | Status: DC

## 2019-01-21 NOTE — Telephone Encounter (Signed)
carvedilol (COREG) 25 MG tablet   REFILL REQUEST @  [facility] ([address]), [place] [phone] (Phone) [phone] (Fax)

## 2019-02-04 ENCOUNTER — Other Ambulatory Visit: Payer: Self-pay | Admitting: Internal Medicine

## 2019-02-04 DIAGNOSIS — K219 Gastro-esophageal reflux disease without esophagitis: Secondary | ICD-10-CM

## 2019-02-04 DIAGNOSIS — I10 Essential (primary) hypertension: Secondary | ICD-10-CM

## 2019-02-04 DIAGNOSIS — E7800 Pure hypercholesterolemia, unspecified: Secondary | ICD-10-CM

## 2019-02-14 DIAGNOSIS — H348322 Tributary (branch) retinal vein occlusion, left eye, stable: Secondary | ICD-10-CM | POA: Diagnosis not present

## 2019-02-14 DIAGNOSIS — H401123 Primary open-angle glaucoma, left eye, severe stage: Secondary | ICD-10-CM | POA: Diagnosis not present

## 2019-02-18 ENCOUNTER — Telehealth: Payer: Self-pay | Admitting: Internal Medicine

## 2019-02-18 NOTE — Telephone Encounter (Signed)
Pt is having question about the COVID vaccine, pls contact [phone]

## 2019-02-18 NOTE — Telephone Encounter (Signed)
RTC, pt asking when the Covid vaccine becomes available, should she receive it because she has asthma.  This RN informed pt Asthma places her in a high risk group for developing severe complications if she were to get Covid disease, therefore, it is recommended she receive the Covid vaccine.  Informed pt the vaccine would not be available to her now, but should be available to her sometime in the first quarter of next year, she verbalized understanding.   SChaplin, RN,BSN

## 2019-03-13 ENCOUNTER — Other Ambulatory Visit: Payer: Self-pay | Admitting: Internal Medicine

## 2019-03-13 DIAGNOSIS — J984 Other disorders of lung: Secondary | ICD-10-CM

## 2019-03-13 MED ORDER — ALBUTEROL SULFATE HFA 108 (90 BASE) MCG/ACT IN AERS: 2.0000 | INHALATION_SPRAY | RESPIRATORY_TRACT | 3 refills | Status: DC | PRN

## 2019-03-13 NOTE — Telephone Encounter (Signed)
Needs refill on albuterol [facility] HFA) 108 (90 Base) MCG/ACT inhaler ;pt contact [place], [place]

## 2019-03-26 ENCOUNTER — Encounter: Payer: Self-pay | Admitting: Internal Medicine

## 2019-03-26 ENCOUNTER — Ambulatory Visit (INDEPENDENT_AMBULATORY_CARE_PROVIDER_SITE_OTHER): Payer: PPO | Admitting: Internal Medicine

## 2019-03-26 DIAGNOSIS — D509 Iron deficiency anemia, unspecified: Secondary | ICD-10-CM

## 2019-03-26 DIAGNOSIS — M5441 Lumbago with sciatica, right side: Secondary | ICD-10-CM

## 2019-03-26 DIAGNOSIS — Z79891 Long term (current) use of opiate analgesic: Secondary | ICD-10-CM

## 2019-03-26 DIAGNOSIS — I10 Essential (primary) hypertension: Secondary | ICD-10-CM | POA: Diagnosis not present

## 2019-03-26 DIAGNOSIS — I288 Other diseases of pulmonary vessels: Secondary | ICD-10-CM | POA: Diagnosis not present

## 2019-03-26 DIAGNOSIS — E785 Hyperlipidemia, unspecified: Secondary | ICD-10-CM | POA: Diagnosis not present

## 2019-03-26 DIAGNOSIS — K219 Gastro-esophageal reflux disease without esophagitis: Secondary | ICD-10-CM

## 2019-03-26 DIAGNOSIS — Z0000 Encounter for general adult medical examination without abnormal findings: Secondary | ICD-10-CM

## 2019-03-26 DIAGNOSIS — E7800 Pure hypercholesterolemia, unspecified: Secondary | ICD-10-CM

## 2019-03-26 DIAGNOSIS — Z79899 Other long term (current) drug therapy: Secondary | ICD-10-CM | POA: Diagnosis not present

## 2019-03-26 DIAGNOSIS — E039 Hypothyroidism, unspecified: Secondary | ICD-10-CM

## 2019-03-26 DIAGNOSIS — J984 Other disorders of lung: Secondary | ICD-10-CM

## 2019-03-26 MED ORDER — LOSARTAN POTASSIUM 100 MG PO TABS: 100.0000 mg | ORAL_TABLET | Freq: Every day | ORAL | 3 refills | Status: DC

## 2019-03-26 MED ORDER — ATORVASTATIN CALCIUM 40 MG PO TABS: 40.0000 mg | ORAL_TABLET | Freq: Every day | ORAL | 3 refills | Status: DC

## 2019-03-26 MED ORDER — CARVEDILOL 25 MG PO TABS: ORAL_TABLET | ORAL | 3 refills | Status: DC

## 2019-03-26 MED ORDER — AMLODIPINE BESYLATE 5 MG PO TABS: 5.0000 mg | ORAL_TABLET | Freq: Every day | ORAL | 3 refills | Status: DC

## 2019-03-26 MED ORDER — ACETAMINOPHEN-CODEINE #3 300-30 MG PO TABS: 1.0000 | ORAL_TABLET | ORAL | 0 refills | Status: DC | PRN

## 2019-03-26 MED ORDER — PANTOPRAZOLE SODIUM 40 MG PO TBEC: 40.0000 mg | DELAYED_RELEASE_TABLET | Freq: Every day | ORAL | 3 refills | Status: DC

## 2019-03-26 MED ORDER — CALCIUM CITRATE-VITAMIN D 315-200 MG-UNIT PO TABS: 1.0000 | ORAL_TABLET | Freq: Two times a day (BID) | ORAL | 3 refills | Status: DC

## 2019-03-26 NOTE — Assessment & Plan Note (Signed)
-  Patient is interested in getting the COVID-19 vaccine.  We discussed the risks and benefit of this today. -Patient given information on how to schedule an appointment for Covid vaccine -No further work-up at this time

## 2019-03-26 NOTE — Progress Notes (Signed)
   Subjective:    Patient ID: [name], female    DOB: Nov 12, 1935, 84 y.o.   MRN: [id number]  HPI  I have seen and examined this patient.  Patient is here for routine follow-up of her hypertension and back pain.  Patient states that she has no pain currently and that she feels well.  Patient states that she is compliant with all her medications.   Review of Systems  Constitutional: Negative.   HENT: Negative.   Respiratory: Negative.   Cardiovascular: Negative.   Gastrointestinal: Negative.   Musculoskeletal: Negative.   Neurological: Negative.   Psychiatric/Behavioral: Negative.        Objective:   Physical Exam Constitutional:      Appearance: Normal appearance.  HENT:     Head: Normocephalic and atraumatic.  Cardiovascular:     Rate and Rhythm: Normal rate and regular rhythm.     Heart sounds: Normal heart sounds.  Pulmonary:     Effort: Pulmonary effort is normal.     Breath sounds: Normal breath sounds. No wheezing or rales.  Abdominal:     General: Bowel sounds are normal. There is no distension.     Palpations: Abdomen is soft.     Tenderness: There is no abdominal tenderness.  Musculoskeletal:        General: No swelling or tenderness.     Cervical back: Neck supple.  Lymphadenopathy:     Cervical: No cervical adenopathy.  Neurological:     General: No focal deficit present.     Mental Status: She is alert and oriented to person, place, and time.  Psychiatric:        Mood and Affect: Mood normal.        Behavior: Behavior normal.           Assessment & Plan:  Please see problem based charting for assessment and plan:

## 2019-03-26 NOTE — Assessment & Plan Note (Signed)
-  This problem is chronic and stable -Patient is compliant with Lipitor 40 mg daily -We will refill this for her today -No further work-up at this time

## 2019-03-26 NOTE — Assessment & Plan Note (Signed)
-  Patient was noted to have an enlarged pulmonary artery on chest CT done in December 2018 -A 2D echo was done at the time showed no evidence of pulmonary hypertension -No further work-up at this time

## 2019-03-26 NOTE — Assessment & Plan Note (Signed)
-  This problem is chronic and stable -Patient denies any symptoms of hypothyroidism currently -We will check repeat TSH at her next visit -No further work-up at this time

## 2019-03-26 NOTE — Assessment & Plan Note (Signed)
-  This problem is chronic and stable -Patient uses Tylenol 3 sparingly for severe back pain -We will refill this medication for her today - database reviewed and appropriate -The benefits of prescribing this medication currently outweigh the risks -No further work-up at this time

## 2019-03-26 NOTE — Assessment & Plan Note (Signed)
-  This problem is chronic and stable -Patient takes iron tablets every other day for her iron deficiency anemia -Patient had an EGD and colonoscopy done last year which showed no evidence of malignancy but did have a small AVM in her duodenum. -We will recheck patient's CBC and iron studies at her next visit -No further work-up at this time.

## 2019-03-26 NOTE — Patient Instructions (Signed)
-  It was a pleasure seeing you today -We will give you some information regarding Covid vaccination.  Please get this when you can. -Blood pressure is little elevated today.  We will recheck this in the office and consider changing her blood pressure regimen if this is elevated at follow-up -You are due for some blood work at your next visit including your blood counts and thyroid level.  We will obtain this at your next office visit. -Please continue to take precautions to avoid getting COVID-19 -I have refilled most of your medications today.  Please call me if you have any issues obtaining these medications -Please call me if you have any questions or concerns

## 2019-03-26 NOTE — Assessment & Plan Note (Signed)
-  This problem is chronic and stable -Patient states that she uses her albuterol inhaler only prior to walking while going to the grocery store.  She states her shortness of breath is under control otherwise. -We will continue to encourage weight loss to see if this will help with her shortness of breath -No further work-up at this time.

## 2019-03-28 ENCOUNTER — Other Ambulatory Visit: Payer: Self-pay | Admitting: Internal Medicine

## 2019-03-28 DIAGNOSIS — M5441 Lumbago with sciatica, right side: Secondary | ICD-10-CM

## 2019-03-28 NOTE — Telephone Encounter (Signed)
Needs refill on diclofenac sodium (VOLTAREN) 1 % GEL ;pt contact [facility] ([place]), [place]

## 2019-03-29 ENCOUNTER — Other Ambulatory Visit: Payer: Self-pay | Admitting: Internal Medicine

## 2019-03-29 DIAGNOSIS — M5441 Lumbago with sciatica, right side: Secondary | ICD-10-CM

## 2019-04-01 MED ORDER — DICLOFENAC SODIUM 1 % EX GEL: 2.0000 g | Freq: Four times a day (QID) | CUTANEOUS | 1 refills | Status: DC

## 2019-05-07 ENCOUNTER — Other Ambulatory Visit: Payer: Self-pay | Admitting: Internal Medicine

## 2019-05-07 DIAGNOSIS — M5441 Lumbago with sciatica, right side: Secondary | ICD-10-CM

## 2019-05-07 MED ORDER — ACETAMINOPHEN-CODEINE #3 300-30 MG PO TABS: 1.0000 | ORAL_TABLET | ORAL | 0 refills | Status: DC | PRN

## 2019-05-07 NOTE — Telephone Encounter (Signed)
Need refill on acetaminophen-codeine (TYLENOL #3) 300-30 MG tablet  ;pt contact [phone]   [id number] - [place] ([place]), [place] - [address] 

## 2019-05-28 DIAGNOSIS — H34832 Tributary (branch) retinal vein occlusion, left eye, with macular edema: Secondary | ICD-10-CM | POA: Diagnosis not present

## 2019-05-28 DIAGNOSIS — H4052X3 Glaucoma secondary to other eye disorders, left eye, severe stage: Secondary | ICD-10-CM | POA: Diagnosis not present

## 2019-05-28 DIAGNOSIS — H35363 Drusen (degenerative) of macula, bilateral: Secondary | ICD-10-CM | POA: Diagnosis not present

## 2019-05-28 DIAGNOSIS — H31092 Other chorioretinal scars, left eye: Secondary | ICD-10-CM | POA: Diagnosis not present

## 2019-05-28 DIAGNOSIS — Z961 Presence of intraocular lens: Secondary | ICD-10-CM | POA: Diagnosis not present

## 2019-06-11 DIAGNOSIS — H4052X3 Glaucoma secondary to other eye disorders, left eye, severe stage: Secondary | ICD-10-CM | POA: Diagnosis not present

## 2019-06-11 DIAGNOSIS — H31092 Other chorioretinal scars, left eye: Secondary | ICD-10-CM | POA: Diagnosis not present

## 2019-06-11 DIAGNOSIS — Z961 Presence of intraocular lens: Secondary | ICD-10-CM | POA: Diagnosis not present

## 2019-06-11 DIAGNOSIS — H34832 Tributary (branch) retinal vein occlusion, left eye, with macular edema: Secondary | ICD-10-CM | POA: Diagnosis not present

## 2019-06-19 ENCOUNTER — Other Ambulatory Visit: Payer: Self-pay | Admitting: Internal Medicine

## 2019-06-19 DIAGNOSIS — M5441 Lumbago with sciatica, right side: Secondary | ICD-10-CM

## 2019-06-19 MED ORDER — ACETAMINOPHEN-CODEINE #3 300-30 MG PO TABS: 1.0000 | ORAL_TABLET | ORAL | 0 refills | Status: DC | PRN

## 2019-06-19 NOTE — Telephone Encounter (Signed)
Need refill on acetaminophen-codeine (TYLENOL #3) 300-30 MG tablet  ;pt contact [phone]   [id number] - [place] ([place]), [place] - [address] 

## 2019-06-25 ENCOUNTER — Ambulatory Visit (INDEPENDENT_AMBULATORY_CARE_PROVIDER_SITE_OTHER): Payer: PPO | Admitting: Internal Medicine

## 2019-06-25 ENCOUNTER — Other Ambulatory Visit: Payer: Self-pay

## 2019-06-25 VITALS — BP 147/65 | HR 68 | Temp 98.2°F | Ht 62.0 in | Wt 252.7 lb

## 2019-06-25 DIAGNOSIS — E785 Hyperlipidemia, unspecified: Secondary | ICD-10-CM

## 2019-06-25 DIAGNOSIS — R0602 Shortness of breath: Secondary | ICD-10-CM | POA: Diagnosis not present

## 2019-06-25 DIAGNOSIS — E7800 Pure hypercholesterolemia, unspecified: Secondary | ICD-10-CM

## 2019-06-25 DIAGNOSIS — M5441 Lumbago with sciatica, right side: Secondary | ICD-10-CM

## 2019-06-25 DIAGNOSIS — Z79891 Long term (current) use of opiate analgesic: Secondary | ICD-10-CM

## 2019-06-25 DIAGNOSIS — D649 Anemia, unspecified: Secondary | ICD-10-CM

## 2019-06-25 DIAGNOSIS — Z853 Personal history of malignant neoplasm of breast: Secondary | ICD-10-CM

## 2019-06-25 DIAGNOSIS — J984 Other disorders of lung: Secondary | ICD-10-CM | POA: Diagnosis not present

## 2019-06-25 DIAGNOSIS — D509 Iron deficiency anemia, unspecified: Secondary | ICD-10-CM

## 2019-06-25 DIAGNOSIS — E039 Hypothyroidism, unspecified: Secondary | ICD-10-CM | POA: Diagnosis not present

## 2019-06-25 DIAGNOSIS — Z79899 Other long term (current) drug therapy: Secondary | ICD-10-CM

## 2019-06-25 DIAGNOSIS — K5520 Angiodysplasia of colon without hemorrhage: Secondary | ICD-10-CM | POA: Diagnosis not present

## 2019-06-25 DIAGNOSIS — Z0000 Encounter for general adult medical examination without abnormal findings: Secondary | ICD-10-CM

## 2019-06-25 DIAGNOSIS — I10 Essential (primary) hypertension: Secondary | ICD-10-CM

## 2019-06-25 NOTE — Assessment & Plan Note (Addendum)
-  This problem is chronic and stable -Patient is compliant with Lipitor 40 mg daily -We will recheck a lipid panel today for her -No further work-up at this time  Addendum: -Patient's lipid panel with LDL and HDL at goal.  No further work-up at this time.  We will continue to monitor -Results discussed with patient via phone

## 2019-06-25 NOTE — Assessment & Plan Note (Signed)
-  Patient states that she received both doses of her COVID-19 vaccine but does not remember what it was [employer] or [employer]

## 2019-06-25 NOTE — Assessment & Plan Note (Signed)
-  This problem is chronic and stable -Patient's weight is currently stable from her last visit -Patient will need to continue to attempt to lose weight by exercising and dieting.  Patient does have difficulty ambulating secondary to pain in her back as well as shortness of breath.  She will use her albuterol inhaler before attempting exercise and we will obtain a physical therapy evaluation for her to see if we could improve her back pain -I believe patient would benefit from referral to [facility] for weight loss counseling if the above measures do not work

## 2019-06-25 NOTE — Assessment & Plan Note (Signed)
-  Patient has a history of breast cancer and follows in [facility].  Patient states that she follows with them yearly -Last mammogram for this patient was in 2016.  Given her age I do not think that she needs to obtain a follow-up mammogram at this time but will have her follow-up with [facility] and discuss whether she needs to continue mammograms or not

## 2019-06-25 NOTE — Assessment & Plan Note (Addendum)
-  This problem is chronic and stable -We will follow-up repeat TSH today -Patient is not on any medication currently for this -We will continue to monitor closely  Addendum: -Patient's TSH is mildly elevated today up to the 7s. -We will start the patient on the low-dose Synthroid 12.5 mcg daily and repeat her TSH, free T4 at her next visit -Plan discussed with patient who expresses understanding and is in agreement with plan

## 2019-06-25 NOTE — Assessment & Plan Note (Addendum)
BP Readings from Last 3 Encounters:  06/25/19 (!) 147/65  03/26/19 (!) 151/81  12/18/18 (!) 144/84    Lab Results  Component Value Date   NA 142 12/18/2018   K 3.6 12/18/2018   CREATININE 0.94 12/18/2018    Assessment: Blood pressure control:  Fair Progress toward BP goal:   Stable Comments: Patient is compliant with losartan 100 mg daily as well as carvedilol 25 mg twice daily and amlodipine 5 mg daily  Plan: Medications:  continue current medications Educational resources provided:   Self management tools provided:   Other plans: We will consider increasing her amlodipine to 10 mg if her blood pressure continues remain elevated at her next visit.  We will follow up with BMP today  Addendum: -Patient noted to have a mildly elevated creatinine at 1.05.  This is borderline abnormal.  It is possible that this may be secondary to prerenal etiology.  Would recheck her BMP at her follow-up visit.  No further work-up at this time. -Results discussed with patient who expresses understanding and is in agreement with plan

## 2019-06-25 NOTE — Assessment & Plan Note (Signed)
-  Patient complains of worsening back pain.  Pain is noted on the right side of her lower back in the paraspinal area -She has no tenderness to palpation today over the area -She does use Tylenol 3 for her back pain and currently the benefits of using this medication outweigh the risks as it allows her to do her daily activities -However, even with this medication she is having difficulty cooking and cleaning her dishes secondary to the pain in her back -She is also having difficulty ambulating secondary to the pain -I think she will benefit from physical therapy for her back pain and doing stretches -We will obtain home health PT evaluation -No further work-up at this time

## 2019-06-25 NOTE — Assessment & Plan Note (Signed)
-  This problem is chronic and stable -Patient states that she uses her albuterol inhaler prior to activity and this helps with her shortness of breath -I encouraged patient to attempt to lose weight to see if this will help with her shortness of breath -No further work-up at this time

## 2019-06-25 NOTE — Assessment & Plan Note (Addendum)
-  This problem is chronic and stable -Patient has history of iron deficiency anemia and is on iron supplementation for this -She did have an EGD and colonoscopy done last year which showed no evidence of malignancy but she did have a small AVM in her duodenum -We will recheck patient's CBC and iron studies today -No further work-up at this time  Addendum: -Patient's hemoglobin and iron studies are within normal limits. -We will continue with oral iron supplementation for now and recheck her CBC and iron studies at her next visit. -If the iron studies and CBC remain normal will consider stopping her iron supplementation and see how she does off of this -Results discussed with patient via phone.  She expresses understanding and is in agreement with plan.

## 2019-06-25 NOTE — Progress Notes (Signed)
   Subjective:    Patient ID: [name], female    DOB: 01/08/1936, 84 y.o.   MRN: [id number]  HPI  I have seen and examined this patient.  Patient is here for routine follow-up of her back pain as well as her hypertension and hypothyroidism.  Patient states that she still has episodes of recurrent back pain and these have limited her activity.  Patient states that she now needs to cook while sitting down and clean her dishes while sitting down secondary to her back pain.  Patient states that activity worsens her back pain as well.  She denies any other complaints at this time and states that she feels well otherwise.  Patient is compliant with all her medications.  Review of Systems  Constitutional: Negative.   HENT: Negative.   Respiratory: Positive for shortness of breath. Negative for cough.        Does have shortness of breath with activity and walking  Cardiovascular: Negative.   Gastrointestinal: Negative.   Musculoskeletal: Positive for back pain.  Neurological: Negative.   Psychiatric/Behavioral: Negative.        Objective:   Physical Exam Constitutional:      Appearance: Normal appearance.  HENT:     Head: Normocephalic and atraumatic.     Mouth/Throat:     Mouth: Mucous membranes are moist.     Pharynx: Oropharynx is clear.  Cardiovascular:     Rate and Rhythm: Normal rate and regular rhythm.     Heart sounds: Normal heart sounds.  Pulmonary:     Effort: Pulmonary effort is normal.     Breath sounds: Normal breath sounds. No wheezing or rales.  Abdominal:     General: Bowel sounds are normal. There is no distension.     Palpations: Abdomen is soft.     Tenderness: There is no abdominal tenderness.  Musculoskeletal:        General: No tenderness.     Cervical back: Neck supple.     Comments: Trace bilateral lower extremity pitting edema noted  Lymphadenopathy:     Cervical: No cervical adenopathy.  Skin:    General: Skin is warm and dry.  Neurological:    General: No focal deficit present.     Mental Status: She is alert and oriented to person, place, and time.  Psychiatric:        Mood and Affect: Mood normal.        Behavior: Behavior normal.           Assessment & Plan:  Please see problem based charting for assessment and plan:

## 2019-06-25 NOTE — Patient Instructions (Signed)
-  It was a pleasure seeing you today -We will check some blood work on you today -Congratulations on getting vaccinated for Covid! -I have put in a referral to physical therapy.  They should be able to come out to her house and work with you to help with your back pain -Please call me if you have any questions or concerns or if you need any refills of medications

## 2019-06-26 ENCOUNTER — Telehealth: Payer: Self-pay | Admitting: *Deleted

## 2019-06-26 LAB — BMP8+ANION GAP
Anion Gap: 12 mmol/L (ref 10.0–18.0)
BUN/Creatinine Ratio: 21 (ref 12–28)
BUN: 22 mg/dL (ref 8–27)
CO2: 25 mmol/L (ref 20–29)
Calcium: 9.5 mg/dL (ref 8.7–10.3)
Chloride: 101 mmol/L (ref 96–106)
Creatinine, Ser: 1.05 mg/dL — ABNORMAL HIGH (ref 0.57–1.00)
GFR calc Af Amer: 58 mL/min/{1.73_m2} — ABNORMAL LOW (ref >59)
GFR calc non Af Amer: 50 mL/min/{1.73_m2} — ABNORMAL LOW (ref >59)
Glucose: 103 mg/dL — ABNORMAL HIGH (ref 65–99)
Potassium: 4.5 mmol/L (ref 3.5–5.2)
Sodium: 138 mmol/L (ref 134–144)

## 2019-06-26 LAB — CBC
Hematocrit: 36.9 % (ref 34.0–46.6)
Hemoglobin: 11.7 g/dL (ref 11.1–15.9)
MCH: 26.7 pg (ref 26.6–33.0)
MCHC: 31.7 g/dL (ref 31.5–35.7)
MCV: 84 fL (ref 79–97)
Platelets: 204 10*3/uL (ref 150–450)
RBC: 4.39 x10E6/uL (ref 3.77–5.28)
RDW: 13.4 % (ref 11.7–15.4)
WBC: 6.7 10*3/uL (ref 3.4–10.8)

## 2019-06-26 LAB — IRON AND TIBC
Iron Saturation: 17 % (ref 15–55)
Iron: 45 ug/dL (ref 27–139)
Total Iron Binding Capacity: 262 ug/dL (ref 250–450)
UIBC: 217 ug/dL (ref 118–369)

## 2019-06-26 LAB — FERRITIN: Ferritin: 84 ng/mL (ref 15–150)

## 2019-06-26 LAB — LIPID PANEL
Chol/HDL Ratio: 2.2 ratio (ref 0.0–4.4)
Cholesterol, Total: 105 mg/dL (ref 100–199)
HDL: 47 mg/dL (ref >39)
LDL Chol Calc (NIH): 45 mg/dL (ref 0–99)
Triglycerides: 55 mg/dL (ref 0–149)
VLDL Cholesterol Cal: 13 mg/dL (ref 5–40)

## 2019-06-26 LAB — TSH: TSH: 7.93 u[IU]/mL — ABNORMAL HIGH (ref 0.450–4.500)

## 2019-06-26 NOTE — Telephone Encounter (Signed)
[facility] was not able to take patient due to insufficient staffing.   Gwenyth Bender with [facility] is able to take patient for [facility] PT with estimated SOC this weekend. Hubbard Hartshorn, BSN, RN-BC

## 2019-06-28 MED ORDER — LEVOTHYROXINE SODIUM 25 MCG PO TABS: 12.5000 ug | ORAL_TABLET | Freq: Every day | ORAL | 0 refills | Status: DC

## 2019-06-28 NOTE — Addendum Note (Signed)
Addended by: Aldine Contes on: 06/28/2019 11:49 AM   Modules accepted: Orders

## 2019-07-03 DIAGNOSIS — Z9181 History of falling: Secondary | ICD-10-CM | POA: Diagnosis not present

## 2019-07-03 DIAGNOSIS — Z853 Personal history of malignant neoplasm of breast: Secondary | ICD-10-CM | POA: Diagnosis not present

## 2019-07-03 DIAGNOSIS — D509 Iron deficiency anemia, unspecified: Secondary | ICD-10-CM | POA: Diagnosis not present

## 2019-07-03 DIAGNOSIS — E039 Hypothyroidism, unspecified: Secondary | ICD-10-CM | POA: Diagnosis not present

## 2019-07-03 DIAGNOSIS — Z6841 Body Mass Index (BMI) 40.0 and over, adult: Secondary | ICD-10-CM | POA: Diagnosis not present

## 2019-07-03 DIAGNOSIS — E7800 Pure hypercholesterolemia, unspecified: Secondary | ICD-10-CM | POA: Diagnosis not present

## 2019-07-03 DIAGNOSIS — I10 Essential (primary) hypertension: Secondary | ICD-10-CM | POA: Diagnosis not present

## 2019-07-03 DIAGNOSIS — Z7982 Long term (current) use of aspirin: Secondary | ICD-10-CM | POA: Diagnosis not present

## 2019-07-03 DIAGNOSIS — Z7951 Long term (current) use of inhaled steroids: Secondary | ICD-10-CM | POA: Diagnosis not present

## 2019-07-03 DIAGNOSIS — J984 Other disorders of lung: Secondary | ICD-10-CM | POA: Diagnosis not present

## 2019-07-03 DIAGNOSIS — M5441 Lumbago with sciatica, right side: Secondary | ICD-10-CM | POA: Diagnosis not present

## 2019-07-04 NOTE — Telephone Encounter (Signed)
[name]  [place], Shanedra Lave L, RN   Morning,   She was admitted to our services yesterday, 07/03/2019.

## 2019-07-08 DIAGNOSIS — M5441 Lumbago with sciatica, right side: Secondary | ICD-10-CM | POA: Diagnosis not present

## 2019-07-08 DIAGNOSIS — J984 Other disorders of lung: Secondary | ICD-10-CM | POA: Diagnosis not present

## 2019-07-08 DIAGNOSIS — E7800 Pure hypercholesterolemia, unspecified: Secondary | ICD-10-CM | POA: Diagnosis not present

## 2019-07-08 DIAGNOSIS — Z853 Personal history of malignant neoplasm of breast: Secondary | ICD-10-CM | POA: Diagnosis not present

## 2019-07-08 DIAGNOSIS — Z7982 Long term (current) use of aspirin: Secondary | ICD-10-CM | POA: Diagnosis not present

## 2019-07-08 DIAGNOSIS — Z6841 Body Mass Index (BMI) 40.0 and over, adult: Secondary | ICD-10-CM | POA: Diagnosis not present

## 2019-07-08 DIAGNOSIS — I10 Essential (primary) hypertension: Secondary | ICD-10-CM | POA: Diagnosis not present

## 2019-07-08 DIAGNOSIS — Z9181 History of falling: Secondary | ICD-10-CM | POA: Diagnosis not present

## 2019-07-08 DIAGNOSIS — Z7951 Long term (current) use of inhaled steroids: Secondary | ICD-10-CM | POA: Diagnosis not present

## 2019-07-08 DIAGNOSIS — E039 Hypothyroidism, unspecified: Secondary | ICD-10-CM | POA: Diagnosis not present

## 2019-07-08 DIAGNOSIS — D509 Iron deficiency anemia, unspecified: Secondary | ICD-10-CM | POA: Diagnosis not present

## 2019-07-15 DIAGNOSIS — E039 Hypothyroidism, unspecified: Secondary | ICD-10-CM | POA: Diagnosis not present

## 2019-07-15 DIAGNOSIS — Z6841 Body Mass Index (BMI) 40.0 and over, adult: Secondary | ICD-10-CM | POA: Diagnosis not present

## 2019-07-15 DIAGNOSIS — I10 Essential (primary) hypertension: Secondary | ICD-10-CM | POA: Diagnosis not present

## 2019-07-15 DIAGNOSIS — J984 Other disorders of lung: Secondary | ICD-10-CM | POA: Diagnosis not present

## 2019-07-15 DIAGNOSIS — Z7982 Long term (current) use of aspirin: Secondary | ICD-10-CM | POA: Diagnosis not present

## 2019-07-15 DIAGNOSIS — D509 Iron deficiency anemia, unspecified: Secondary | ICD-10-CM | POA: Diagnosis not present

## 2019-07-15 DIAGNOSIS — Z9181 History of falling: Secondary | ICD-10-CM | POA: Diagnosis not present

## 2019-07-15 DIAGNOSIS — E7800 Pure hypercholesterolemia, unspecified: Secondary | ICD-10-CM | POA: Diagnosis not present

## 2019-07-15 DIAGNOSIS — Z853 Personal history of malignant neoplasm of breast: Secondary | ICD-10-CM | POA: Diagnosis not present

## 2019-07-15 DIAGNOSIS — Z7951 Long term (current) use of inhaled steroids: Secondary | ICD-10-CM | POA: Diagnosis not present

## 2019-07-15 DIAGNOSIS — M5441 Lumbago with sciatica, right side: Secondary | ICD-10-CM | POA: Diagnosis not present

## 2019-07-22 DIAGNOSIS — D509 Iron deficiency anemia, unspecified: Secondary | ICD-10-CM | POA: Diagnosis not present

## 2019-07-22 DIAGNOSIS — M5441 Lumbago with sciatica, right side: Secondary | ICD-10-CM | POA: Diagnosis not present

## 2019-07-22 DIAGNOSIS — E039 Hypothyroidism, unspecified: Secondary | ICD-10-CM | POA: Diagnosis not present

## 2019-07-22 DIAGNOSIS — Z6841 Body Mass Index (BMI) 40.0 and over, adult: Secondary | ICD-10-CM | POA: Diagnosis not present

## 2019-07-22 DIAGNOSIS — Z7982 Long term (current) use of aspirin: Secondary | ICD-10-CM | POA: Diagnosis not present

## 2019-07-22 DIAGNOSIS — Z7951 Long term (current) use of inhaled steroids: Secondary | ICD-10-CM | POA: Diagnosis not present

## 2019-07-22 DIAGNOSIS — Z9181 History of falling: Secondary | ICD-10-CM | POA: Diagnosis not present

## 2019-07-22 DIAGNOSIS — I10 Essential (primary) hypertension: Secondary | ICD-10-CM | POA: Diagnosis not present

## 2019-07-22 DIAGNOSIS — Z853 Personal history of malignant neoplasm of breast: Secondary | ICD-10-CM | POA: Diagnosis not present

## 2019-07-22 DIAGNOSIS — E7800 Pure hypercholesterolemia, unspecified: Secondary | ICD-10-CM | POA: Diagnosis not present

## 2019-07-22 DIAGNOSIS — J984 Other disorders of lung: Secondary | ICD-10-CM | POA: Diagnosis not present

## 2019-07-31 DIAGNOSIS — M5441 Lumbago with sciatica, right side: Secondary | ICD-10-CM | POA: Diagnosis not present

## 2019-07-31 DIAGNOSIS — Z853 Personal history of malignant neoplasm of breast: Secondary | ICD-10-CM | POA: Diagnosis not present

## 2019-07-31 DIAGNOSIS — E7800 Pure hypercholesterolemia, unspecified: Secondary | ICD-10-CM | POA: Diagnosis not present

## 2019-07-31 DIAGNOSIS — Z9181 History of falling: Secondary | ICD-10-CM | POA: Diagnosis not present

## 2019-07-31 DIAGNOSIS — E039 Hypothyroidism, unspecified: Secondary | ICD-10-CM | POA: Diagnosis not present

## 2019-07-31 DIAGNOSIS — Z6841 Body Mass Index (BMI) 40.0 and over, adult: Secondary | ICD-10-CM | POA: Diagnosis not present

## 2019-07-31 DIAGNOSIS — J984 Other disorders of lung: Secondary | ICD-10-CM | POA: Diagnosis not present

## 2019-07-31 DIAGNOSIS — Z7982 Long term (current) use of aspirin: Secondary | ICD-10-CM | POA: Diagnosis not present

## 2019-07-31 DIAGNOSIS — I10 Essential (primary) hypertension: Secondary | ICD-10-CM | POA: Diagnosis not present

## 2019-07-31 DIAGNOSIS — Z7951 Long term (current) use of inhaled steroids: Secondary | ICD-10-CM | POA: Diagnosis not present

## 2019-07-31 DIAGNOSIS — D509 Iron deficiency anemia, unspecified: Secondary | ICD-10-CM | POA: Diagnosis not present

## 2019-08-02 ENCOUNTER — Other Ambulatory Visit: Payer: Self-pay

## 2019-08-02 DIAGNOSIS — M5441 Lumbago with sciatica, right side: Secondary | ICD-10-CM

## 2019-08-02 NOTE — Telephone Encounter (Signed)
acetaminophen-codeine (TYLENOL #3) 300-30 MG tablet   Refill request @  [facility] ([address] [place] - [place] [id number] (Phone) [phone] (Fax)

## 2019-08-03 ENCOUNTER — Other Ambulatory Visit: Payer: Self-pay | Admitting: Internal Medicine

## 2019-08-03 DIAGNOSIS — M5441 Lumbago with sciatica, right side: Secondary | ICD-10-CM

## 2019-08-06 NOTE — Telephone Encounter (Signed)
My imprivata is not working. Dr. Rebeca Alert has kindly agreed to fill this for me.

## 2019-08-07 DIAGNOSIS — E7800 Pure hypercholesterolemia, unspecified: Secondary | ICD-10-CM | POA: Diagnosis not present

## 2019-08-07 DIAGNOSIS — D509 Iron deficiency anemia, unspecified: Secondary | ICD-10-CM | POA: Diagnosis not present

## 2019-08-07 DIAGNOSIS — I10 Essential (primary) hypertension: Secondary | ICD-10-CM | POA: Diagnosis not present

## 2019-08-07 DIAGNOSIS — J984 Other disorders of lung: Secondary | ICD-10-CM | POA: Diagnosis not present

## 2019-08-07 DIAGNOSIS — Z6841 Body Mass Index (BMI) 40.0 and over, adult: Secondary | ICD-10-CM | POA: Diagnosis not present

## 2019-08-07 DIAGNOSIS — Z9181 History of falling: Secondary | ICD-10-CM | POA: Diagnosis not present

## 2019-08-07 DIAGNOSIS — Z7982 Long term (current) use of aspirin: Secondary | ICD-10-CM | POA: Diagnosis not present

## 2019-08-07 DIAGNOSIS — Z7951 Long term (current) use of inhaled steroids: Secondary | ICD-10-CM | POA: Diagnosis not present

## 2019-08-07 DIAGNOSIS — M5441 Lumbago with sciatica, right side: Secondary | ICD-10-CM | POA: Diagnosis not present

## 2019-08-07 DIAGNOSIS — E039 Hypothyroidism, unspecified: Secondary | ICD-10-CM | POA: Diagnosis not present

## 2019-08-07 DIAGNOSIS — Z853 Personal history of malignant neoplasm of breast: Secondary | ICD-10-CM | POA: Diagnosis not present

## 2019-08-14 DIAGNOSIS — E039 Hypothyroidism, unspecified: Secondary | ICD-10-CM | POA: Diagnosis not present

## 2019-08-14 DIAGNOSIS — Z7951 Long term (current) use of inhaled steroids: Secondary | ICD-10-CM | POA: Diagnosis not present

## 2019-08-14 DIAGNOSIS — I10 Essential (primary) hypertension: Secondary | ICD-10-CM | POA: Diagnosis not present

## 2019-08-14 DIAGNOSIS — D509 Iron deficiency anemia, unspecified: Secondary | ICD-10-CM | POA: Diagnosis not present

## 2019-08-14 DIAGNOSIS — Z853 Personal history of malignant neoplasm of breast: Secondary | ICD-10-CM | POA: Diagnosis not present

## 2019-08-14 DIAGNOSIS — M5441 Lumbago with sciatica, right side: Secondary | ICD-10-CM | POA: Diagnosis not present

## 2019-08-14 DIAGNOSIS — E7800 Pure hypercholesterolemia, unspecified: Secondary | ICD-10-CM | POA: Diagnosis not present

## 2019-08-14 DIAGNOSIS — Z7982 Long term (current) use of aspirin: Secondary | ICD-10-CM | POA: Diagnosis not present

## 2019-08-14 DIAGNOSIS — Z6841 Body Mass Index (BMI) 40.0 and over, adult: Secondary | ICD-10-CM | POA: Diagnosis not present

## 2019-08-14 DIAGNOSIS — Z9181 History of falling: Secondary | ICD-10-CM | POA: Diagnosis not present

## 2019-08-14 DIAGNOSIS — J984 Other disorders of lung: Secondary | ICD-10-CM | POA: Diagnosis not present

## 2019-08-15 DIAGNOSIS — H401123 Primary open-angle glaucoma, left eye, severe stage: Secondary | ICD-10-CM | POA: Diagnosis not present

## 2019-08-15 DIAGNOSIS — H31092 Other chorioretinal scars, left eye: Secondary | ICD-10-CM | POA: Diagnosis not present

## 2019-08-15 DIAGNOSIS — Z961 Presence of intraocular lens: Secondary | ICD-10-CM | POA: Diagnosis not present

## 2019-08-15 DIAGNOSIS — H25811 Combined forms of age-related cataract, right eye: Secondary | ICD-10-CM | POA: Diagnosis not present

## 2019-08-15 DIAGNOSIS — H348322 Tributary (branch) retinal vein occlusion, left eye, stable: Secondary | ICD-10-CM | POA: Diagnosis not present

## 2019-08-26 DIAGNOSIS — H3582 Retinal ischemia: Secondary | ICD-10-CM | POA: Diagnosis not present

## 2019-08-26 DIAGNOSIS — H4052X2 Glaucoma secondary to other eye disorders, left eye, moderate stage: Secondary | ICD-10-CM | POA: Diagnosis not present

## 2019-08-26 DIAGNOSIS — H31092 Other chorioretinal scars, left eye: Secondary | ICD-10-CM | POA: Diagnosis not present

## 2019-08-26 DIAGNOSIS — H34832 Tributary (branch) retinal vein occlusion, left eye, with macular edema: Secondary | ICD-10-CM | POA: Diagnosis not present

## 2019-09-06 ENCOUNTER — Other Ambulatory Visit: Payer: Self-pay

## 2019-09-06 DIAGNOSIS — M5441 Lumbago with sciatica, right side: Secondary | ICD-10-CM

## 2019-09-06 MED ORDER — ACETAMINOPHEN-CODEINE #3 300-30 MG PO TABS: 1.0000 | ORAL_TABLET | ORAL | 0 refills | Status: DC | PRN

## 2019-09-06 NOTE — Telephone Encounter (Signed)
Need refill on acetaminophen-codeine (TYLENOL #3) 300-30 MG tablet  ;pt contact [place] ([place]), [place] - [place]

## 2019-10-03 ENCOUNTER — Other Ambulatory Visit: Payer: Self-pay

## 2019-10-03 DIAGNOSIS — D649 Anemia, unspecified: Secondary | ICD-10-CM

## 2019-10-03 MED ORDER — FERROUS SULFATE 325 (65 FE) MG PO TABS: 325.0000 mg | ORAL_TABLET | Freq: Every day | ORAL | 1 refills | Status: DC

## 2019-10-03 NOTE — Telephone Encounter (Signed)
ferrous sulfate 325 (65 FE) MG tablet, REFILL REQUEST @  [place] ([place]),  - [id number] Phone:  [phone]  Fax:  [phone]

## 2019-10-11 ENCOUNTER — Other Ambulatory Visit: Payer: Self-pay | Admitting: Internal Medicine

## 2019-10-11 DIAGNOSIS — J984 Other disorders of lung: Secondary | ICD-10-CM

## 2019-12-25 ENCOUNTER — Telehealth: Payer: Self-pay | Admitting: *Deleted

## 2019-12-25 NOTE — Telephone Encounter (Signed)
Sounds good

## 2019-12-25 NOTE — Telephone Encounter (Signed)
Patient called in saying she needs something for her asthma. C/o occasional SHOB with walking. Audible expiratory wheezes heard while patient talking. No SHOB noted during conversation. She is able to speak in full sentences without stopping. Denies SHOB at rest. Has been using albuterol inhaler 4 puffs every 4 hours. Explained that this should be 2 puffs every 4 hours prn. She also reports drinking lots of water. Cough is only occasional and is productive of clear sputum. Denies fever/chills. Appt scheduled with Red Team tomorrow AM. She understands to head to ED before that if [facility] begins at rest or if she develops fever. Hubbard Hartshorn, BSN, RN-BC

## 2019-12-25 NOTE — Telephone Encounter (Signed)
I agree. Thank you.

## 2019-12-26 ENCOUNTER — Encounter: Payer: PPO | Admitting: Student

## 2019-12-27 ENCOUNTER — Ambulatory Visit (INDEPENDENT_AMBULATORY_CARE_PROVIDER_SITE_OTHER): Payer: Medicare HMO | Admitting: Student

## 2019-12-27 VITALS — BP 128/73 | HR 66 | Temp 98.0°F | Ht 62.0 in | Wt 249.2 lb

## 2019-12-27 DIAGNOSIS — R0609 Other forms of dyspnea: Secondary | ICD-10-CM

## 2019-12-27 DIAGNOSIS — E039 Hypothyroidism, unspecified: Secondary | ICD-10-CM | POA: Diagnosis not present

## 2019-12-27 DIAGNOSIS — I872 Venous insufficiency (chronic) (peripheral): Secondary | ICD-10-CM

## 2019-12-27 DIAGNOSIS — R011 Cardiac murmur, unspecified: Secondary | ICD-10-CM

## 2019-12-27 DIAGNOSIS — Z23 Encounter for immunization: Secondary | ICD-10-CM

## 2019-12-27 DIAGNOSIS — R7303 Prediabetes: Secondary | ICD-10-CM

## 2019-12-27 DIAGNOSIS — J984 Other disorders of lung: Secondary | ICD-10-CM | POA: Diagnosis not present

## 2019-12-27 DIAGNOSIS — I10 Essential (primary) hypertension: Secondary | ICD-10-CM

## 2019-12-27 DIAGNOSIS — Z0000 Encounter for general adult medical examination without abnormal findings: Secondary | ICD-10-CM

## 2019-12-27 DIAGNOSIS — Z0001 Encounter for general adult medical examination with abnormal findings: Secondary | ICD-10-CM

## 2019-12-27 DIAGNOSIS — M5441 Lumbago with sciatica, right side: Secondary | ICD-10-CM | POA: Diagnosis not present

## 2019-12-27 DIAGNOSIS — R0600 Dyspnea, unspecified: Secondary | ICD-10-CM

## 2019-12-27 LAB — POCT GLYCOSYLATED HEMOGLOBIN (HGB A1C): Hemoglobin A1C: 5.6 % (ref 4.0–5.6)

## 2019-12-27 LAB — GLUCOSE, CAPILLARY: Glucose-Capillary: 109 mg/dL — ABNORMAL HIGH (ref 70–99)

## 2019-12-27 MED ORDER — ACETAMINOPHEN-CODEINE #3 300-30 MG PO TABS: 1.0000 | ORAL_TABLET | ORAL | 0 refills | Status: DC | PRN

## 2019-12-27 MED ORDER — METHOCARBAMOL 750 MG PO TABS: 750.0000 mg | ORAL_TABLET | Freq: Four times a day (QID) | ORAL | 0 refills | Status: AC | PRN

## 2019-12-27 NOTE — Patient Instructions (Signed)
Thank you, [name] for allowing [place] to provide your care today. Today we discussed the reason that low back pain.  We do not think you have asthma but we have ordered a test to check your lung function and refer you to pulmonology.  We have also ordered a test to look at your heart.  I have ordered the following labs for you:   Lab Orders     BMP8+Anion Gap     TSH     POC Hbg A1C   I will call if any are abnormal. All of your labs can be accessed through "My Chart".  I have place a referrals to pulmonology.  I have ordered the following tests: PFT and echo  I have ordered the following medication/changed the following medications:  1. Start Robaxin 750 mg every 6 hours as needed for muscle spasm  Please follow-up in 3 months  Should you have any questions or concerns please call the internal medicine clinic at [phone].    Natalie Dibbles, MD, MPH [facility]   My Chart Access: [email or web address]   If you have not already done so, please get your COVID 19 vaccine  To schedule an appointment for a COVID vaccine choice any of the following: Go to [email or web address]   Go to [email or web address]                  Call [phone]                                     Call [phone] and select Option 2

## 2019-12-27 NOTE — Progress Notes (Signed)
   CC: SOB and wheezing  HPI:  [name] is a 84 y.o. female with PMH as below presented to clinic for an evaluation of wheezing and SOB.   Please see problem based charting for evaluation, assessment and plan.  Past Medical History:  Diagnosis Date  . Allergic rhinitis, cause unspecified   . Asthma   . Cancer ([place])   . Chronic back pain   . Coronary artery calcification seen on CAT scan 04/21/2017  . Enlarged pulmonary artery ([place]) 04/21/2017   By chest CT  . Frequent urination 11/14/2017  . GERD (gastroesophageal reflux disease) 05/19/2011  . history of Bilateral leg edema-likely amlodipine induced. 07/28/2011  . history of CAP (community acquired pneumonia) 07/14/2011  . history of HYPOTHYROIDISM, BORDERLINE 03/20/2006   Annotation: Asymptomatic, untreated Qualifier: Diagnosis of  By: Tomasa Hosteller MD, Edmon Crape.   . Hx of breast cancer   . Hyperlipidemia   . Hypertension   . Hypothyroidism   . Lumbago   . Murmur, cardiac 11/22/2016  . OBESITY NOS 03/20/2006   Qualifier: Diagnosis of  By: Tomasa Hosteller MD, [place] 12/13/2005   Annotation: bilateral knees;right knee injection 6/05 Qualifier: Diagnosis of  By: Tomasa Hosteller MD, Veronique D.   . right shoulder pain, likely Impingement syndrome  09/09/2010  . VENOUS INSUFFICIENCY, CHRONIC 03/20/2006   Qualifier: Diagnosis of  By: Tomasa Hosteller MD, Edmon Crape.    Review of Systems:  Patient endorse dyspnea on exertion, wheezing, occasional dry cough, leg swelling and chronic low back pain but denies any SOB at rest, chest pain, palpitations, headaches, dizziness or abd pain.   Physical Exam:  General: Pleasant elderly woman sitting in a chair. No acute distress. Well nourished, well developed. Head: Normocephalic, atraumatic Cardiac: RRR. Faint murmur present. No rubs or gallops. Trace BLE pitting edema to the mid calf. Respiratory: Lungs CTAB. No wheezing or crackles. Normal effort.  Abdominal: Soft, symmetric and non  tender. No organomegaly. Normal bowel sounds Skin: Warm, dry and intact without rashes or lesions Extremities: Atraumatic. Full ROM. Pulse palpable. Neuro: A&O x 3. Moves all extremities. Psych: Appropriate mood and affect. Normal judgement  Vitals:   12/27/19 0922 12/27/19 0950  BP: (!) 151/93 128/73  Pulse: 72 66  Temp: 98 F (36.7 C)   TempSrc: Oral   SpO2: 97%   Weight: 249 lb 3.2 oz (113 kg)   Height: 5\' 2"  (1.575 m)     Assessment & Plan:   See Encounters Tab for problem based charting.  Patient seen with Dr. Emelia Salisbury, MD, MPH

## 2019-12-28 ENCOUNTER — Encounter: Payer: Self-pay | Admitting: Student

## 2019-12-28 LAB — BMP8+ANION GAP
Anion Gap: 15 mmol/L (ref 10.0–18.0)
BUN/Creatinine Ratio: 19 (ref 12–28)
BUN: 18 mg/dL (ref 8–27)
CO2: 25 mmol/L (ref 20–29)
Calcium: 9.6 mg/dL (ref 8.7–10.3)
Chloride: 103 mmol/L (ref 96–106)
Creatinine, Ser: 0.94 mg/dL (ref 0.57–1.00)
GFR calc Af Amer: 66 mL/min/{1.73_m2} (ref >59)
GFR calc non Af Amer: 57 mL/min/{1.73_m2} — ABNORMAL LOW (ref >59)
Glucose: 110 mg/dL — ABNORMAL HIGH (ref 65–99)
Potassium: 3.7 mmol/L (ref 3.5–5.2)
Sodium: 143 mmol/L (ref 134–144)

## 2019-12-28 LAB — TSH: TSH: 6.43 u[IU]/mL — ABNORMAL HIGH (ref 0.450–4.500)

## 2019-12-28 NOTE — Assessment & Plan Note (Signed)
TSH improved from 7.9 to 6.4 after starting low dose synthroid. Will keep on current regimen and monitor closley.  Plan: --Continue synthroid 12.5 mcg --Monitor with TSH and free T4

## 2019-12-28 NOTE — Assessment & Plan Note (Signed)
Patient called the office complaining of increased wheezing and dyspnea on exertion and was scheduled for an office visit. Patient states she has had increased wheezing and shortness of breath whenever she gets up to do any activity in her house. She is having to use her albuterol every 4 hrs due to the wheezing. She reports chronic LE swelling and occasional dry cough but denies any palpitations, chest pain, dizziness, headaches, PND, nocturnal awakening, fever, chills or abd pain. No wheezing on exam but murmur present. Patient's last PFT 3 years ago showed a restrictive pattern. Will consider a repeat PFT to assess for any changes and refer back to pulmonology for further evaluation of worsening wheezing and SOB. Will also get a repeat ECHO to rule out new onset heart failure.   Plan: --Advised to use albuterol before starting any activity --Follow up PFT --Follow up ECHO --Refered back to Dr. Chase Caller

## 2019-12-28 NOTE — Assessment & Plan Note (Signed)
Received flu vaccine today

## 2019-12-28 NOTE — Assessment & Plan Note (Signed)
Patient's BP initially elevate to 151/93 but repeat was normal at 128/73. BP currently well-controlled and at goal. Denies any headche or dizziness. Mild Cr bump improved to Cr of 0.94 today. Continue to watch.   Plan: --Continue losartan 100 mg daily --Continue amlodipine 5 mg daily --Continue carvedilol 25 mg twice daily

## 2019-12-28 NOTE — Assessment & Plan Note (Signed)
A1c improved from 6.0 to 5.6 today. Continue to monitor yearly or every 2 years.

## 2019-12-28 NOTE — Assessment & Plan Note (Signed)
Patient continues to endorse low back pain. States the Tylenol 3 helps but does not take the pain away. Patient started home PT but states the stretches do not help much. Endorse tenderness in the paraspinal muscles so patient will likely benefit from a short course of muscle relaxer.   Plan: --Refilled Tylenol 3 today --Start Robaxin 750 mg as needed for muscle spasms.  --Continue home PT

## 2019-12-28 NOTE — Assessment & Plan Note (Signed)
Patient continues to have lower extremity swelling. On exam, she has bilateral trace edema to the mid calf. No developing rash on lower extremities. Getting an echo to assess if there is an element of new onset Hf contributing to her leg swelling.   Plan: --Continue to monitor --Encourage to elevate legs during prolong sitting --Follow up Echo

## 2019-12-30 NOTE — Progress Notes (Signed)
Internal Medicine Clinic Attending  I saw and evaluated the patient.  I personally confirmed the key portions of the history and exam documented by Dr. Amponsah and I reviewed pertinent patient test results.  The assessment, diagnosis, and plan were formulated together and I agree with the documentation in the resident's note.  

## 2020-01-01 ENCOUNTER — Other Ambulatory Visit: Payer: Self-pay | Admitting: Internal Medicine

## 2020-01-01 ENCOUNTER — Other Ambulatory Visit: Payer: Self-pay

## 2020-01-01 ENCOUNTER — Ambulatory Visit (INDEPENDENT_AMBULATORY_CARE_PROVIDER_SITE_OTHER): Payer: Medicare HMO | Admitting: Internal Medicine

## 2020-01-01 ENCOUNTER — Telehealth: Payer: Self-pay | Admitting: Internal Medicine

## 2020-01-01 VITALS — BP 130/90 | HR 77 | Temp 97.6°F | Ht 62.0 in | Wt 248.8 lb

## 2020-01-01 DIAGNOSIS — I5189 Other ill-defined heart diseases: Secondary | ICD-10-CM | POA: Diagnosis not present

## 2020-01-01 DIAGNOSIS — R0600 Dyspnea, unspecified: Secondary | ICD-10-CM

## 2020-01-01 DIAGNOSIS — R0609 Other forms of dyspnea: Secondary | ICD-10-CM

## 2020-01-01 DIAGNOSIS — Z87898 Personal history of other specified conditions: Secondary | ICD-10-CM | POA: Diagnosis not present

## 2020-01-01 DIAGNOSIS — R7989 Other specified abnormal findings of blood chemistry: Secondary | ICD-10-CM

## 2020-01-01 DIAGNOSIS — J986 Disorders of diaphragm: Secondary | ICD-10-CM | POA: Diagnosis not present

## 2020-01-01 DIAGNOSIS — R079 Chest pain, unspecified: Secondary | ICD-10-CM

## 2020-01-01 DIAGNOSIS — R053 Chronic cough: Secondary | ICD-10-CM

## 2020-01-01 LAB — CBC WITH DIFFERENTIAL/PLATELET
Basophils Absolute: 0 10*3/uL (ref 0.0–0.1)
Basophils Relative: 0.5 % (ref 0.0–3.0)
Eosinophils Absolute: 0.6 10*3/uL (ref 0.0–0.7)
Eosinophils Relative: 8.6 % — ABNORMAL HIGH (ref 0.0–5.0)
HCT: 37 % (ref 36.0–46.0)
Hemoglobin: 12 g/dL (ref 12.0–15.0)
Lymphocytes Relative: 18.5 % (ref 12.0–46.0)
Lymphs Abs: 1.3 10*3/uL (ref 0.7–4.0)
MCHC: 32.4 g/dL (ref 30.0–36.0)
MCV: 86.3 fl (ref 78.0–100.0)
Monocytes Absolute: 0.7 10*3/uL (ref 0.1–1.0)
Monocytes Relative: 10.3 % (ref 3.0–12.0)
Neutro Abs: 4.4 10*3/uL (ref 1.4–7.7)
Neutrophils Relative %: 62.1 % (ref 43.0–77.0)
Platelets: 185 10*3/uL (ref 150.0–400.0)
RBC: 4.28 Mil/uL (ref 3.87–5.11)
RDW: 14.7 % (ref 11.5–15.5)
WBC: 7.1 10*3/uL (ref 4.0–10.5)

## 2020-01-01 NOTE — Telephone Encounter (Signed)
Called and spoke with pt letting her know the results of the labwork and letting her know that MR wants her to have CT performed to rule out PE due to elevated d-dimer. Pt verbalized understanding. Order for CT has been placed. Nothing further needed.

## 2020-01-01 NOTE — Patient Instructions (Addendum)
ICD-10-CM   1. Dyspnea on exertion  R06.00   2. Chronic cough  R05.3   3. History of wheezing  Z87.898   4. History of snoring  Z87.898   5. Elevated diaphragm  J98.6   6. Severe obesity (BMI >= 40) (HCC)  E66.01   7. Diastolic dysfunction  X28.20     concerned that the shortness of breath is for multiple reasons that includes weight, stiff heart muscle, physical deconditioning.  In any event we need to rule out any lung disease  Plan -Do blood work for CBC with differential, blood IgE and D-dimer -Do full pulmonary function test and exhaled nitric oxide/FENO test -Do high-resolution CT chest supine and prone -Do 6-minute walk test -Do overnight oxygen study on room air  Follow-up -Next few to several weeks with a nurse practitioner to review all the test results and to narrow the possibilities why you might have shortness of breath.  Details are below.

## 2020-01-01 NOTE — Addendum Note (Signed)
Addended by: Vanessa Barbara on: 01/01/2020 02:02 PM   Modules accepted: Orders

## 2020-01-01 NOTE — Progress Notes (Signed)
PCP Natalie Contes, MD   HPI   IOV 01/16/2017  Chief Complaint  Patient presents with  . Advice Only    Referred by Dr. Dareen [name] due to abn. PFT 01/04/17. States that she has occ. coughing with burning in chest.   84 year old morbidly obese female.  Presents for new consult.  History is gleaned from talking to her and review of the primary care physician chart.  Patient reports a one-year history of shortness of breath that is of insidious onset it is not necessarily progressive.  It is definitely present on exertion.  She notices it when she would walk "quite a ways".  She could not quantify this.  In talking to her she tells me that she is not short of breath for activities of daily living inside the house such as changing clothes or doing the bed but she is short of breath if she were to climb a flight of stairs she would have to stop one time.  At this point shortness of breath is rated as moderate.  Rest relieves shortness of breath along with an albuterol rescue inhaler that she has been using for the last 1 week.  Associated with shortness of breath is a history of wheezing and coughing that is of much milder intensity for which the albuterol definitely helps.  The cough is dry in quality.  Med review shows that she is on ACE inhibitor  Personal visualization of chest x-rays dating back to 2013 and going forward to 2017 show chronic elevation of the left hemidiaphragm.  She does have a CT abdomen/pelvis done in 2015.  The lung cuts do show some basal pulmonary infiltrates but these could easily be atelectasis as opposed to ILD findings on account of her raised left hemidiaphragm.  Her pulmonary function test done on January 04, 2017 that I personally visualize shows restriction with reduced diffusion capacity of moderate severity [name] is morbidly obese]  Review of the chart shows that she has a systolic murmur for which an echo has been ordered although I do not see the echo  result in the computer.  Her recent hemoglobin and creatinine are normal as documented below.   Walking desaturation test on 01/16/2017 185 feet x 3 laps:  did  ONLY 1 lap and stopped due to dsypnea and wheeze. Did NOT desaturate. Rest pulse ox was 100%, final pulse ox was 97%. HR response 77/min at rest to 115/min at peak exertion.    OV 02/07/2017  Chief Complaint  Patient presents with  . Follow-up    follow up for sob, only feels sob if walking a long way   Follow-up multifactorial dyspnea and cough  Cough: This is completely resolved after stopping ACE inhibitor  Shortness of breath: This is also improved significantly after stopping ACE inhibitor.  She still has some residual dyspnea on exertion relieved by rest no associated chest pain.  For this she had workup with a high-resolution CT chest.  Formal thoracic radiology interpretation is pending but in my personal visualization she only has raised left hemidiaphragm but no interstitial lung disease.  She was supposed to have significant test for this  raised hemidiaphragm but this was canceled presumably by the patient but she denies knowledge of this.  Her morbid obesity continues.  She cannot do pulmonary rehabilitation because of joint issues.  Nevertheless overall she is able to walk longer distances.     Results for [name] (MRN [id number]) as of 01/16/2017  09:40  Ref. Range 01/04/2017 09:54  FVC-Pre Latest Units: [name] 1.22  FVC-%Pred-Pre Latest Units: % 66  FEV1-Pre Latest Units: [name] 0.99  FEV1-%Pred-Pre Latest Units: % 69  Pre FEV1/FVC ratio Latest Units: % 81  Results for [name] (MRN [id number]) as of 01/16/2017 09:40  Ref. Range 01/04/2017 09:54  TLC Latest Units: [name] 2.93  TLC % pred Latest Units: % 61  Results for [name] (MRN [id number]) as of 01/16/2017 09:40  Ref. Range 01/04/2017 09:54  DLCO unc Latest Units: ml/min/mmHg 10.30  DLCO unc % pred Latest Units: % 47    OV 01/01/2020  Subjective:   Patient ID: [name], female , DOB: 1935/03/11 , age 75 y.o. , MRN: [id number] , ADDRESS: [address] [place] [address] PCP Natalie Contes, MD Patient Care Team: Natalie Contes, MD as PCP - General  This Provider for this visit: Treatment Team:  Attending Provider: Brand Males, MD    01/01/2020 -   Chief Complaint  Patient presents with  . Follow-up    sob with walking in the house, started a year ago, has gotten worse.     [name] 84 y.o. -returns after nearly 3 years.  This is an established visit because technically is not 3 years yet.  Last CT scan of the chest was in January 2020.  She has stable right middle lobe nodule 5 mm.  She has elevated diaphragm on the left side [in 2018 it was noted on sniff test there is less movement here with demonstration but no paralysis].  Her last PFTs was in 2018.  Most recent blood work was in October 2021 with a creatinine that is normal 0.94 mg percent and April 2021 CBC of 11.7 g% at baseline compared to 2020.  She had a stress test March 2019 with good ejection fraction and deemed as low risk study.  Echocardiogram January 2020 with ejection fraction that is good with grade 1 diastolic dysfunction   she tells me that since she saw me almost 3 years ago she has got progressive shortness of breath. Cough is associated [name] present with shortness of breath.  There is also wheezing with exertional dyspnea.  She also snoring at night and on occasion has excessive daytime somnolence.  SYMPTOM SCALE - ILD 01/01/2020 Last Weight  Most recent update: 01/01/2020 11:12 AM   Weight  112.9 kg (248 lb 12.8 oz)             O2 use ra  Shortness of Breath 0 -> 5 scale with 5 being worst (score 6 If unable to do)  At rest 4  Simple tasks - showers, clothes change, eating, shaving 5  Household (dishes, doing bed, laundry) 5  Shopping 5  Walking level at own pace 5  Walking up Stairs 6  Total (30-36) Dyspnea Score x   How bad is your cough? Yes with wheeze  How bad is your fatigue yes  How bad is nausea 00  How bad is vomiting?  0  How bad is diarrhea? 0  How bad is anxiety? 0  How bad is depression 0          ROS - per HPI     has a past medical history of Allergic rhinitis, cause unspecified, Asthma, Cancer ([facility]), Chronic back pain, Coronary artery calcification seen on CAT scan (04/21/2017), Enlarged pulmonary artery ([facility]) (04/21/2017), Frequent urination (11/14/2017), GERD (gastroesophageal reflux disease) (05/19/2011), history of Bilateral leg  edema-likely amlodipine induced. (07/28/2011), history of CAP (community acquired pneumonia) (07/14/2011), history of HYPOTHYROIDISM, BORDERLINE (03/20/2006), breast cancer, Hyperlipidemia, Hypertension, Hypothyroidism, Lumbago, Murmur, cardiac (11/22/2016), OBESITY NOS (03/20/2006), OSTEOARTHRITIS (12/13/2005), right shoulder pain, likely Impingement syndrome  (09/09/2010), and VENOUS INSUFFICIENCY, CHRONIC (03/20/2006).   reports that she quit smoking about 61 years ago. Her smoking use included cigarettes. She has a 0.13 pack-year smoking history. She has never used smokeless tobacco.  Past Surgical History:  Procedure Laterality Date  . ABDOMINAL HYSTERECTOMY    . BREAST LUMPECTOMY    . COLONOSCOPY    . HERNIA REPAIR    . HIP CLOSED REDUCTION Right 11/26/2014   Procedure: CLOSED REDUCTIION RIGHT HIP;  Surgeon: Newt Minion, MD;  Location: [facility];  Service: Orthopedics;  Laterality: Right;  . INCISIONAL HERNIA REPAIR N/A 10/29/2013   Procedure: OPEN REPAIR OF RECURRENT INCISIONAL HERNIA WITH MESH;  Surgeon: Zenovia Jarred, MD;  Location: [facility];  Service: General;  Laterality: N/A;  . JOINT REPLACEMENT    . TOTAL HIP ARTHROPLASTY      Allergies  Allergen Reactions  . Acyclovir And Related   . Metoprolol Itching  . Food Rash and Other (See Comments)    Pt states that she is allergic to pickles.      Immunization History  Administered Date(s)  Administered  . Fluad Quad(high Dose 65+) 12/27/2019  . H1N1 03/26/2008  . Influenza Split 11/25/2010, 01/19/2012  . Influenza Whole 02/09/2006, 12/10/2007, 04/16/2009, 01/07/2010  . Influenza,inj,Quad PF,6+ Mos 02/15/2013, 12/09/2014, 01/12/2016, 11/22/2016, 11/14/2017  . PFIZER SARS-COV-2 Vaccination 04/13/2019, 05/04/2019  . Pneumococcal Conjugate-13 09/04/2014  . Pneumococcal Polysaccharide-23 09/10/2010  . Tdap 11/25/2010    Family History  Problem Relation Age of Onset  . Hypertension Mother   . Alzheimer's disease Mother   . Diabetes Sister   . Colon cancer Neg Hx   . Colon polyps Neg Hx   . Esophageal cancer Neg Hx   . Stomach cancer Neg Hx   . Rectal cancer Neg Hx      Current Outpatient Medications:  .  acetaminophen (TYLENOL 8 HOUR) 650 MG CR tablet, Take 1 tablet (650 mg total) by mouth every 8 (eight) hours as needed., Disp: 30 tablet, Rfl: 2 .  acetaminophen-codeine (TYLENOL #3) 300-30 MG tablet, Take 1 tablet by mouth every 4 (four) hours as needed for moderate pain., Disp: 30 tablet, Rfl: 0 .  albuterol (VENTOLIN HFA) 108 (90 Base) MCG/ACT inhaler, INHALE 2 PUFFS INTO THE LUNGS EVERY 4 HOURS AS NEEDED FOR WHEEZING FOR SHORTNESS OF BREATH, Disp: 18 g, Rfl: 0 .  amLODipine (NORVASC) 5 MG tablet, Take 1 tablet (5 mg total) by mouth daily., Disp: 90 tablet, Rfl: 3 .  aspirin EC 81 MG tablet, Take 81 mg by mouth daily., Disp: , Rfl:  .  atorvastatin (LIPITOR) 40 MG tablet, Take 1 tablet (40 mg total) by mouth daily., Disp: 90 tablet, Rfl: 3 .  calcium citrate-vitamin D (CITRACAL+D) 315-200 MG-UNIT tablet, Take 1 tablet by mouth 2 (two) times daily., Disp: 120 tablet, Rfl: 3 .  carvedilol (COREG) 25 MG tablet, TAKE 1 TABLET BY MOUTH TWICE DAILY WITH A MEAL, Disp: 180 tablet, Rfl: 3 .  Cyanocobalamin (VITAMIN B12 PO), Take 1 tablet daily by mouth., Disp: , Rfl:  .  diclofenac Sodium (VOLTAREN) 1 % GEL, Apply 2 g topically 4 (four) times daily., Disp: 150 g, Rfl: 1 .   diclofenac Sodium (VOLTAREN) 1 % GEL, APPLY 2 GRAMS  TOPICALLY AS NEEDED, Disp:  100 g, Rfl: 2 .  ferrous sulfate 325 (65 FE) MG tablet, Take 1 tablet (325 mg total) by mouth daily., Disp: 90 tablet, Rfl: 1 .  levothyroxine (SYNTHROID) 25 MCG tablet, Take 0.5 tablets (12.5 mcg total) by mouth daily before breakfast., Disp: 45 tablet, Rfl: 0 .  losartan (COZAAR) 100 MG tablet, Take 1 tablet (100 mg total) by mouth daily., Disp: 90 tablet, Rfl: 3 .  pantoprazole (PROTONIX) 40 MG tablet, Take 1 tablet (40 mg total) by mouth daily., Disp: 90 tablet, Rfl: 3      Objective:   Vitals:   01/01/20 1108  BP: 130/90  Pulse: 77  Temp: 97.6 F (36.4 C)  TempSrc: Temporal  SpO2: (!) 89%  Weight: 248 lb 12.8 oz (112.9 kg)  Height: 5\' 2"  (1.575 m)    Estimated body mass index is 45.51 kg/m as calculated from the following:   Height as of this encounter: 5\' 2"  (1.575 m).   Weight as of this encounter: 248 lb 12.8 oz (112.9 kg).  @WEIGHTCHANGE @  [employer]   01/01/20 1108  Weight: 248 lb 12.8 oz (112.9 kg)     Physical Exam  General: No distress. OBESE Neuro: Alert and Oriented x 3. GCS 15. Speech normal Psych: Pleasant Resp:  Barrel Chest - no.  Wheeze - no, Crackles - no, No overt respiratory distress CVS: Normal heart sounds. Murmurs - no Ext: Stigmata of Connective Tissue Disease - no HEENT: Normal upper airway. PEERL +. No post nasal drip        Assessment:       ICD-10-CM   1. Dyspnea on exertion  R06.00   2. Chronic cough  R05.3   3. History of wheezing  Z87.898   4. History of snoring  Z87.898   5. Elevated diaphragm  J98.6   6. Severe obesity (BMI >= 40) (HCC)  E66.01   7. Diastolic dysfunction  Z61.09        Plan:     Patient Instructions     ICD-10-CM   1. Dyspnea on exertion  R06.00   2. Chronic cough  R05.3   3. History of wheezing  Z87.898   4. History of snoring  Z87.898   5. Elevated diaphragm  J98.6   6. Severe obesity (BMI >= 40) (HCC)  E66.01    7. Diastolic dysfunction  U04.54     concerned that the shortness of breath is for multiple reasons that includes weight, stiff heart muscle, physical deconditioning.  In any event we need to rule out any lung disease  Plan -Do blood work for CBC with differential, blood IgE and D-dimer -Do full pulmonary function test and exhaled nitric oxide/FENO test -Do high-resolution CT chest supine and prone -Do 6-minute walk test -Do overnight oxygen study on room air  Follow-up -Next few to several weeks with a nurse practitioner to review all the test results and to narrow the possibilities why you might have shortness of breath.  Details are below.      SIGNATURE    Dr. Brand [name], M.D., F.C.C.P,  Pulmonary and Critical Care Medicine Staff Physician, [place] Director - Interstitial Lung Disease  Program  [facility] [place], [place], [address]  Pager: [phone], If no answer or between  15:00h - 7:00h: call [phone] Telephone: [phone]  11:25 AM 01/01/2020

## 2020-01-01 NOTE — Telephone Encounter (Signed)
     D-dimer high - > 1.0  Plan  - get HRCT this week (non contrast) but also add CT angio chest rule out pe. Should be done 01/02/20 or 01/03/20. Might need 2 orders  Indication - dyspnea, elevated d-dimer  LABS    PULMONARY No results for input(s): PHART, PCO2ART, PO2ART, HCO3, TCO2, O2SAT in the last 168 hours.  Invalid input(s): PCO2, PO2  CBC Recent Labs  Lab 01/01/20 1144  HGB 12.0  HCT 37.0  WBC 7.1  PLT 185.0    COAGULATION No results for input(s): INR in the last 168 hours.  CARDIAC  No results for input(s): TROPONINI in the last 168 hours. No results for input(s): PROBNP in the last 168 hours.   CHEMISTRY Recent Labs  Lab 12/27/19 1028  NA 143  K 3.7  CL 103  CO2 25  GLUCOSE 110*  BUN 18  CREATININE 0.94  CALCIUM 9.6   Estimated Creatinine Clearance: 55.7 mL/min (by C-G formula based on SCr of 0.94 mg/dL).   LIVER No results for input(s): AST, ALT, ALKPHOS, BILITOT, PROT, ALBUMIN, INR in the last 168 hours.   INFECTIOUS No results for input(s): LATICACIDVEN, PROCALCITON in the last 168 hours.   ENDOCRINE CBG (last 3)  No results for input(s): GLUCAP in the last 72 hours.       IMAGING x48h  - image(s) personally visualized  -   highlighted in bold No results found.

## 2020-01-01 NOTE — Addendum Note (Signed)
Addended by: Suzzanne Cloud E on: 01/01/2020 11:44 AM   Modules accepted: Orders

## 2020-01-01 NOTE — Addendum Note (Signed)
Addended by: Lorretta Harp on: 01/01/2020 11:40 AM   Modules accepted: Orders

## 2020-01-02 ENCOUNTER — Telehealth: Payer: Self-pay | Admitting: Internal Medicine

## 2020-01-02 ENCOUNTER — Ambulatory Visit (INDEPENDENT_AMBULATORY_CARE_PROVIDER_SITE_OTHER)
Admission: RE | Admit: 2020-01-02 | Discharge: 2020-01-02 | Disposition: A | Discharge status: Home / Self Care | Payer: Medicare HMO | Source: Ambulatory Visit | Attending: Internal Medicine | Admitting: Internal Medicine

## 2020-01-02 DIAGNOSIS — I2510 Atherosclerotic heart disease of native coronary artery without angina pectoris: Secondary | ICD-10-CM | POA: Diagnosis not present

## 2020-01-02 DIAGNOSIS — I2699 Other pulmonary embolism without acute cor pulmonale: Secondary | ICD-10-CM | POA: Diagnosis not present

## 2020-01-02 DIAGNOSIS — R0600 Dyspnea, unspecified: Secondary | ICD-10-CM

## 2020-01-02 DIAGNOSIS — R7989 Other specified abnormal findings of blood chemistry: Secondary | ICD-10-CM

## 2020-01-02 DIAGNOSIS — R0602 Shortness of breath: Secondary | ICD-10-CM | POA: Diagnosis not present

## 2020-01-02 DIAGNOSIS — R918 Other nonspecific abnormal finding of lung field: Secondary | ICD-10-CM | POA: Diagnosis not present

## 2020-01-02 DIAGNOSIS — R0609 Other forms of dyspnea: Secondary | ICD-10-CM

## 2020-01-02 DIAGNOSIS — I700 Atherosclerosis of aorta: Secondary | ICD-10-CM | POA: Diagnosis not present

## 2020-01-02 LAB — HOUSE ACCOUNT TRACKING

## 2020-01-02 LAB — IGE: IgE (Immunoglobulin E), Serum: 481 kU/L — ABNORMAL HIGH (ref <=114)

## 2020-01-02 MED ORDER — IOHEXOL 350 MG/ML SOLN
80.0000 mL | Freq: Once | INTRAVENOUS | Status: AC | PRN
  Administered 2020-01-02: 80 mL via INTRAVENOUS

## 2020-01-02 NOTE — Telephone Encounter (Signed)
Spoke with pt. She is aware that she is needing CTa done urgently because her D dimer was elevated. I explained what this means and why this needs to be done as soon as possible. Pt verbalized understanding. Nothing further was needed.

## 2020-01-06 LAB — IGE

## 2020-01-06 LAB — D-DIMER, QUANTITATIVE: D-Dimer, Quant: 1.01 mcg/mL FEU — ABNORMAL HIGH (ref <0.50)

## 2020-01-07 ENCOUNTER — Telehealth: Payer: Self-pay | Admitting: Internal Medicine

## 2020-01-07 NOTE — Telephone Encounter (Signed)
   CT chest is negative for pulmonary embolism.  However she has elevated diaphragm and possible evolving ILD.  In addition she has aortic valve calcifications, enlarged pulmonary arteries and coronary artery calcification [low risk cardiac stress test study in 2019]  Plan -Order echocardiogram -Please let her know the above results    IMPRESSION: 1. Negative examination for pulmonary embolism. 2. Enlargement of the main pulmonary artery, measuring up to 3.5 cm in caliber, as can be seen in pulmonary hypertension. 3. Remaining findings in the chest as separately reported on ILD protocol CT.   Electronically Signed   By: Eddie Candle M.D.   On: 01/02/2020 12:12   IMPRESSION: 1. Redemonstrated severe elevation of the left hemidiaphragm with associated atelectasis, which may be symptomatic. 2. There is mild irregular peripheral interstitial opacity and septal thickening about the right lung base, including in nondependent portions of the right middle lobe. These findings appear to be slightly worsened compared to prior examinations dated 03/16/2018 and 02/06/2017 and are suspicious for mild fibrotic interstitial lung disease. If characterized by ATS pulmonary fibrosis criteria, these findings are in an early "indeterminate for UIP" pattern. Consider ongoing follow-up to assess for stability of fibrotic findings and pattern. Findings are indeterminate for UIP per consensus guidelines: Diagnosis of Idiopathic Pulmonary Fibrosis: An Official ATS/ERS/JRS/ALAT Clinical Practice Guideline. [place], Iss 5, [phone], Nov 05 2016. 3. Coronary artery disease. 4. Aortic Atherosclerosis (ICD10-I70.0). 5. Aortic valve calcifications. Correlate for echocardiographic evidence of aortic valve dysfunction.   Electronically Signed   By: Eddie Candle M.D.   On: 01/02/2020 12:09

## 2020-01-08 NOTE — Telephone Encounter (Signed)
Echocardiogram is scheduled 01/17/20  Pt is scheduled to have 6 min walk performed 11/10. Pt also has PFT scheduled 11/26 followed by OV with Aaron Edelman.  Called and spoke with pt letting her know the results of CT and stated to her that results will be gone over in full detail at that appt. Pt verbalized understanding. Nothing further needed.

## 2020-01-09 ENCOUNTER — Other Ambulatory Visit: Payer: Self-pay

## 2020-01-09 DIAGNOSIS — J984 Other disorders of lung: Secondary | ICD-10-CM

## 2020-01-09 MED ORDER — ALBUTEROL SULFATE HFA 108 (90 BASE) MCG/ACT IN AERS: INHALATION_SPRAY | RESPIRATORY_TRACT | 0 refills | Status: DC

## 2020-01-09 NOTE — Telephone Encounter (Signed)
Need refill on albuterol (VENTOLIN HFA) 108 (90 Base) MCG/ACT inhaler ;pt contact [facility] [place] [facility] [place]

## 2020-01-15 ENCOUNTER — Other Ambulatory Visit: Payer: Self-pay

## 2020-01-15 ENCOUNTER — Ambulatory Visit (INDEPENDENT_AMBULATORY_CARE_PROVIDER_SITE_OTHER): Payer: Medicare HMO

## 2020-01-15 DIAGNOSIS — R0600 Dyspnea, unspecified: Secondary | ICD-10-CM

## 2020-01-15 DIAGNOSIS — R0609 Other forms of dyspnea: Secondary | ICD-10-CM

## 2020-01-15 NOTE — Progress Notes (Signed)
Six Minute Walk - 01/15/20 1235      Six Minute Walk   Medications taken before test (dose and time) amlodipine 5mg , ASA 81mg , atorvastatin 40mg , calcium citrate-Vit D 315-200mg -unit, carvedilol 25mg , Vit B-12, ferrous sulfate 325mg , levothyroxine 63mcg, losartan potassium 100mg , pantoprazole 40mg  all at 9:30    Supplemental oxygen during test? No    Lap distance in meters  34 meters    Laps Completed 2    Partial lap (in meters) 0 meters    Baseline BP (sitting) 124/74    Baseline Heartrate 71    Baseline Dyspnea (Borg Scale) 2    Baseline Fatigue (Borg Scale) 1    Baseline SPO2 97 %      End of Test Values    BP (sitting) 130/90    Heartrate 81    Dyspnea (Borg Scale) 7    Fatigue (Borg Scale) 6    SPO2 90 %      2 Minutes Post Walk Values   BP (sitting) 128/80    Heartrate 66    SPO2 96 %    Stopped or paused before six minutes? Yes    Reason: Patient had to stop and pause with 4 min 42 sec remaining for about 30 seconds to catch her breath but then had to fully stop walk with 3 min 38 seconds remaining due to breathing      Interpretation   Distance completed 68 meters    Tech Comments: Patient walked at a slow pace with a cane having to stop once at 54min 42 sec mark to catch breath but then had to fully stop walk with 35min 38sec remaining due to breathing.

## 2020-01-17 ENCOUNTER — Ambulatory Visit (HOSPITAL_COMMUNITY)
Admission: RE | Admit: 2020-01-17 | Discharge: 2020-01-17 | Disposition: A | Discharge status: Home / Self Care | Payer: Medicare HMO | Source: Ambulatory Visit | Attending: Internal Medicine | Admitting: Internal Medicine

## 2020-01-17 ENCOUNTER — Other Ambulatory Visit: Payer: Self-pay

## 2020-01-17 DIAGNOSIS — R0600 Dyspnea, unspecified: Secondary | ICD-10-CM

## 2020-01-17 DIAGNOSIS — I351 Nonrheumatic aortic (valve) insufficiency: Secondary | ICD-10-CM | POA: Insufficient documentation

## 2020-01-17 DIAGNOSIS — R011 Cardiac murmur, unspecified: Secondary | ICD-10-CM

## 2020-01-17 DIAGNOSIS — R0609 Other forms of dyspnea: Secondary | ICD-10-CM

## 2020-01-17 LAB — ECHOCARDIOGRAM COMPLETE
AR max vel: 1.16 cm2
AV Area VTI: 1.07 cm2
AV Area mean vel: 1.17 cm2
AV Mean grad: 10.7 mmHg
AV Peak grad: 18.1 mmHg
Ao pk vel: 2.13 m/s
Area-P 1/2: 2.83 cm2

## 2020-01-17 NOTE — Progress Notes (Signed)
  Echocardiogram 2D Echocardiogram has been performed.  [name] 01/17/2020, 2:46 PM

## 2020-01-31 ENCOUNTER — Ambulatory Visit: Payer: Medicare HMO | Admitting: Pulmonary Disease

## 2020-02-05 NOTE — Progress Notes (Signed)
Thanks for your hard work!   [name]

## 2020-02-11 ENCOUNTER — Encounter: Payer: Self-pay | Admitting: Internal Medicine

## 2020-02-11 DIAGNOSIS — Z961 Presence of intraocular lens: Secondary | ICD-10-CM | POA: Diagnosis not present

## 2020-02-11 DIAGNOSIS — H401123 Primary open-angle glaucoma, left eye, severe stage: Secondary | ICD-10-CM | POA: Diagnosis not present

## 2020-02-11 DIAGNOSIS — H25811 Combined forms of age-related cataract, right eye: Secondary | ICD-10-CM | POA: Diagnosis not present

## 2020-02-18 DIAGNOSIS — R062 Wheezing: Secondary | ICD-10-CM | POA: Diagnosis not present

## 2020-02-19 ENCOUNTER — Other Ambulatory Visit: Payer: Self-pay

## 2020-02-19 DIAGNOSIS — M5441 Lumbago with sciatica, right side: Secondary | ICD-10-CM

## 2020-02-19 MED ORDER — ACETAMINOPHEN-CODEINE #3 300-30 MG PO TABS: 1.0000 | ORAL_TABLET | ORAL | 0 refills | Status: DC | PRN

## 2020-02-19 NOTE — Telephone Encounter (Signed)
Requesting pain to be filled @  [facility] ([address] [place] Phone:  [phone]  Fax:  [phone]

## 2020-02-25 ENCOUNTER — Other Ambulatory Visit: Payer: Self-pay

## 2020-02-25 ENCOUNTER — Telehealth: Payer: Self-pay | Admitting: Internal Medicine

## 2020-02-25 DIAGNOSIS — H31092 Other chorioretinal scars, left eye: Secondary | ICD-10-CM | POA: Diagnosis not present

## 2020-02-25 DIAGNOSIS — I10 Essential (primary) hypertension: Secondary | ICD-10-CM

## 2020-02-25 DIAGNOSIS — H4052X3 Glaucoma secondary to other eye disorders, left eye, severe stage: Secondary | ICD-10-CM | POA: Diagnosis not present

## 2020-02-25 DIAGNOSIS — H3582 Retinal ischemia: Secondary | ICD-10-CM | POA: Diagnosis not present

## 2020-02-25 DIAGNOSIS — J984 Other disorders of lung: Secondary | ICD-10-CM

## 2020-02-25 DIAGNOSIS — H35032 Hypertensive retinopathy, left eye: Secondary | ICD-10-CM | POA: Diagnosis not present

## 2020-02-25 DIAGNOSIS — H34832 Tributary (branch) retinal vein occlusion, left eye, with macular edema: Secondary | ICD-10-CM | POA: Diagnosis not present

## 2020-02-25 MED ORDER — LOSARTAN POTASSIUM 100 MG PO TABS: 100.0000 mg | ORAL_TABLET | Freq: Every day | ORAL | 3 refills | Status: DC

## 2020-02-25 NOTE — Telephone Encounter (Signed)
Called and spoke with pt letting her know the results of the ono and that it qualifies her for O2 at night. Pt verbalized understanding. Order has been placed. Nothing further needed.

## 2020-02-25 NOTE — Telephone Encounter (Signed)
ono 02/12/20 - time <= 885 is 120.35min  Plan  -start 2L [place] at night

## 2020-03-04 ENCOUNTER — Ambulatory Visit: Payer: Medicare HMO | Admitting: Pulmonary Disease

## 2020-03-10 ENCOUNTER — Encounter: Payer: Self-pay | Admitting: Pulmonary Disease

## 2020-03-10 ENCOUNTER — Other Ambulatory Visit: Payer: Self-pay | Admitting: Pulmonary Disease

## 2020-03-10 ENCOUNTER — Other Ambulatory Visit: Payer: Self-pay

## 2020-03-10 ENCOUNTER — Ambulatory Visit: Payer: Medicare HMO | Admitting: Internal Medicine

## 2020-03-10 ENCOUNTER — Ambulatory Visit: Payer: Medicare HMO | Admitting: Pulmonary Disease

## 2020-03-10 VITALS — BP 128/74 | HR 74 | Temp 97.3°F | Ht 62.0 in | Wt 247.4 lb

## 2020-03-10 DIAGNOSIS — R0600 Dyspnea, unspecified: Secondary | ICD-10-CM | POA: Diagnosis not present

## 2020-03-10 DIAGNOSIS — R0609 Other forms of dyspnea: Secondary | ICD-10-CM | POA: Insufficient documentation

## 2020-03-10 DIAGNOSIS — Z0000 Encounter for general adult medical examination without abnormal findings: Secondary | ICD-10-CM | POA: Insufficient documentation

## 2020-03-10 DIAGNOSIS — I2510 Atherosclerotic heart disease of native coronary artery without angina pectoris: Secondary | ICD-10-CM | POA: Diagnosis not present

## 2020-03-10 DIAGNOSIS — Z7189 Other specified counseling: Secondary | ICD-10-CM | POA: Insufficient documentation

## 2020-03-10 DIAGNOSIS — R053 Chronic cough: Secondary | ICD-10-CM

## 2020-03-10 DIAGNOSIS — J986 Disorders of diaphragm: Secondary | ICD-10-CM | POA: Insufficient documentation

## 2020-03-10 DIAGNOSIS — G4734 Idiopathic sleep related nonobstructive alveolar hypoventilation: Secondary | ICD-10-CM

## 2020-03-10 DIAGNOSIS — J984 Other disorders of lung: Secondary | ICD-10-CM | POA: Diagnosis not present

## 2020-03-10 LAB — PULMONARY FUNCTION TEST
DL/VA % pred: 105 %
DL/VA: 4.34 ml/min/mmHg/L
DLCO cor % pred: 40 %
DLCO cor: 7.03 ml/min/mmHg
DLCO unc % pred: 40 %
DLCO unc: 7.03 ml/min/mmHg
FEF 25-75 Pre: 0.69 L/sec
FEF2575-%Pred-Pre: 62 %
FEV1-%Pred-Pre: 58 %
FEV1-Pre: 0.78 L
FEV1FVC-%Pred-Pre: 105 %
FEV6-%Pred-Pre: 59 %
FEV6-Pre: 0.98 L
FEV6FVC-%Pred-Pre: 104 %
FVC-%Pred-Pre: 56 %
FVC-Pre: 0.98 L
Pre FEV1/FVC ratio: 79 %
Pre FEV6/FVC Ratio: 100 %

## 2020-03-10 MED ORDER — BREO ELLIPTA 200-25 MCG/INH IN AEPB: 1.0000 | INHALATION_SPRAY | Freq: Every day | RESPIRATORY_TRACT | 0 refills | Status: DC

## 2020-03-10 NOTE — Assessment & Plan Note (Signed)
Chronic left hemidiaphragm This is likely a contributing component to patient's shortness of breath Could consider sleep study to see if patient may qualify for CPAP therapy, patient declined this today

## 2020-03-10 NOTE — Assessment & Plan Note (Signed)
BMI 45.2  Plan: Continue to work with primary care on working to reduce your BMI

## 2020-03-10 NOTE — Patient Instructions (Addendum)
You were seen today by Coral Ceo, NP  for:   1. Dyspnea on exertion  I believe your shortness of breath is likely multifactorial given potential aspect of asthma, your elevated left hemidiaphragm, restrictive lung disease, scarring on lungs as well as BMI of 45  I would like to try you on a maintenance inhaler to see if this helps you with your breathing  Trial of Breo Ellipta 200 >>> Take 1 puff daily in the morning right when you wake up >>>Rinse your mouth out after use >>>This is a daily maintenance inhaler, NOT a rescue inhaler >>>Contact our office if you are having difficulties affording or obtaining this medication >>>It is important for you to be able to take this daily and not miss any doses   Only use your albuterol as a rescue medication to be used if you can't catch your breath by resting or doing a relaxed purse lip breathing pattern.  - The less you use it, the better it will work when you need it. - Ok to use up to 2 puffs  every 4 hours if you must but call for immediate appointment if use goes up over your usual need - Don't leave home without it !!  (think of it like the spare tire for your car)    2. Restrictive lung disease  We will continue to clinically monitor  3. Elevated hemidiaphragm  Her left hemidiaphragm is elevated  This is a contributing component to your shortness of breath  We could consider a sleep study in the future  4. Nocturnal hypoxia  Your overnight oximetry test does show that you need 2 L of O2 at night   We will contact her DME company to see why you have not yet been started on oxygen  5. Coronary artery calcification seen on CAT scan  Please schedule follow-up with your cardiologist Dr. Armanda Magic  6. Severe obesity (BMI >= 40) (HCC)  Continue to work with primary care on working to reduce your BMI  7. Healthcare maintenance  Great job being up-to-date on your COVID-19 vaccinations   Follow Up:    Return in about  4 weeks (around 04/07/2020), or if symptoms worsen or fail to improve, for Follow up with Elisha Headland FNP-C.   Notification of test results are managed in the following manner: If there are  any recommendations or changes to the  plan of care discussed in office today,  we will contact you and let you know what they are. If you do not hear from [place], then your results are normal and you can view them through your  MyChart account , or a letter will be sent to you. Thank you again for trusting [place] with your care  - Thank you, [facility]    It is flu season:   >>> Best ways to protect herself from the flu: Receive the yearly flu vaccine, practice good hand hygiene washing with soap and also using hand sanitizer when available, eat a nutritious meals, get adequate rest, hydrate appropriately       Please contact the office if your symptoms worsen or you have concerns that you are not improving.   Thank you for choosing [facility] for your healthcare, and for allowing [place] to partner with you on your healthcare journey. I am thankful to be able to provide care to you today.   Elisha Headland FNP-C

## 2020-03-10 NOTE — Progress Notes (Addendum)
@Patient  ID: [name], female    DOB: [birth year], 85 y.o.   MRN: [id number]  Chief Complaint  Patient presents with  . Follow-up    SOB, chest tightness and wheezing with exertion    Referring provider: Aldine Contes, MD  HPI:  85 year old female former smoker found our office for dyspnea on exertion  PMH: Hyperlipidemia, hypertension, hypothyroidism, severe obesity, GERD Smoker/ Smoking History: Former smoker.  Quit 1960. Maintenance: None Pt of: Dr. Chase Caller  04/24/2020  - Visit   85 year old female former smoker followed in our office by Dr. Chase Caller.  Patient followed in our office for dyspnea on exertion.  Patient was last seen by Dr. Chase Caller in October/2021.  Plan of care from the office visit in October/2021 was as follows: Do blood work CBC with differential, IgE, D-dimer, do full pulmonary function testing, FeNO, high-resolution CT chest, 6-minute walk, overnight oximetry study.  D-dimer did show elevation.  Is recommended the patient obtain a CT angio chest.  This was negative for PE.  Did show elevated the diaphragm as well as possibly involving ILD.  An echocardiogram was ordered.  Patient completed a 6-minute walk test.  She only completed 68 m..  Patient presenting today to complete pulmonary function testing.  Unfortunately patient used rescue inhaler 1 hour before presenting to her office.  A spirometry with DLCO was performed due to this.  Those results are listed below:  03/10/2020-spirometry with DLCO-FVC 0.98 (56% predicted), ratio 79, FEV1 0.78 (58% predicted), DLCO 7.03 (40% predicted)  Patient reporting today that she was never started on nocturnal oxygen.  We will discuss and evaluate this.  She is not on a maintenance inhaler.  She has never been on a maintenance inhaler as far she is aware.  She does have a rescue inhaler.  CBC with differential and IgE both are elevated.   Questionaires / Pulmonary Flowsheets:   ACT:  No flowsheet data  found.  MMRC: mMRC Dyspnea Scale mMRC Score  03/10/2020 3    Epworth:  No flowsheet data found.  Tests:   03/10/2020-spirometry with DLCO-FVC 0.98 (56% predicted), ratio 79, FEV1 0.78 (58% predicted), DLCO 7.03 (40% predicted)  01/01/2020-IgE-481 01/01/2020-CBC with differential-eosinophils relative 8.6, eosinophils absolute 0.6  01/02/2020-CT chest high-res-redemonstrated severe elevation of left hemidiaphragm with associated atelectasis, mild irregular peripheral interstitial opacity, slightly worsened compared to prior examinations dated 03/16/2018 in 02/06/2017, suspicious for mild fibrotic interstitial lung disease, coronary artery disease, aortic arch arthrosclerosis  01/17/2020-echocardiogram-LV ejection fraction grossly normal, 55 to 60%, right ventricular systolic function not well visualized  02/12/2020-overnight oximetry showed time spent below 88% 120.6 minutes, plan was to start 2 L of O2  FENO:  No results found for: NITRICOXIDE  PFT: PFT Results Latest Ref Rng & Units 03/10/2020 01/04/2017  FVC-Pre L 0.98 1.22  FVC-Predicted Pre % 56 66  FVC-Post L - 1.31  FVC-Predicted Post % - 70  Pre FEV1/FVC % % 79 81  Post FEV1/FCV % % - 87  FEV1-Pre L 0.78 0.99  FEV1-Predicted Pre % 58 69  FEV1-Post L - 1.13  DLCO uncorrected ml/min/mmHg 7.03 10.30  DLCO UNC% % 40 47  DLCO corrected ml/min/mmHg 7.03 -  DLCO COR %Predicted % 40 -  DLVA Predicted % 105 92  TLC L - 2.93  TLC % Predicted % - 61  RV % Predicted % - 55    WALK:  SIX MIN WALK 01/15/2020 01/16/2017  Medications amlodipine 5mg , ASA 81mg , atorvastatin 40mg , calcium citrate-Vit D 315-200mg -unit, carvedilol  25mg , Vit B-12, ferrous sulfate 325mg , levothyroxine 102mcg, losartan potassium 100mg , pantoprazole 40mg  all at 9:30 -  Supplimental Oxygen during Test? (L/min) No No  Laps 2 -  Partial Lap (in Meters) 0 -  Baseline BP (sitting) 124/74 -  Baseline Heartrate 71 -  Baseline Dyspnea (Borg Scale) 2 -  Baseline  Fatigue (Borg Scale) 1 -  Baseline SPO2 97 -  BP (sitting) 130/90 -  Heartrate 81 -  Dyspnea (Borg Scale) 7 -  Fatigue (Borg Scale) 6 -  SPO2 90 -  BP (sitting) 128/80 -  Heartrate 66 -  SPO2 96 -  Stopped or Paused before Six Minutes Yes -  Other Symptoms at end of Exercise Patient had to stop and pause with 4 min 42 sec remaining for about 30 seconds to catch her breath but then had to fully stop walk with 3 min 38 seconds remaining due to breathing -  Distance Completed 68 -  Distance Completed 0 -  Tech Comments: Patient walked at a slow pace with a cane having to stop once at 65min 42 sec mark to catch breath but then had to fully stop walk with 71min 38sec remaining due to breathing. Pt walked with a cane and only able to complete one lap due to becoming very SOB with wheezing.    Imaging: No results found.  Lab Results:  CBC    Component Value Date/Time   WBC 7.1 01/01/2020 1144   RBC 4.28 01/01/2020 1144   HGB 12.0 01/01/2020 1144   HGB 11.7 06/25/2019 1020   HCT 37.0 01/01/2020 1144   HCT 36.9 06/25/2019 1020   PLT 185.0 01/01/2020 1144   PLT 204 06/25/2019 1020   MCV 86.3 01/01/2020 1144   MCV 84 06/25/2019 1020   MCH 26.7 06/25/2019 1020   MCH 26.9 12/01/2018 1655   MCHC 32.4 01/01/2020 1144   RDW 14.7 01/01/2020 1144   RDW 13.4 06/25/2019 1020   LYMPHSABS 1.3 01/01/2020 1144   LYMPHSABS 1.3 11/20/2018 1023   MONOABS 0.7 01/01/2020 1144   EOSABS 0.6 01/01/2020 1144   EOSABS 0.4 11/20/2018 1023   BASOSABS 0.0 01/01/2020 1144   BASOSABS 0.1 11/20/2018 1023    BMET    Component Value Date/Time   NA 143 12/27/2019 1028   K 3.7 12/27/2019 1028   CL 103 12/27/2019 1028   CO2 25 12/27/2019 1028   GLUCOSE 110 (H) 12/27/2019 1028   GLUCOSE 127 (H) 12/01/2018 1655   BUN 18 12/27/2019 1028   CREATININE 0.94 12/27/2019 1028   CREATININE 0.91 09/23/2014 1415   CALCIUM 9.6 12/27/2019 1028   GFRNONAA 57 (L) 12/27/2019 1028   GFRNONAA 61 09/23/2014 1415    GFRAA 66 12/27/2019 1028   GFRAA 71 09/23/2014 1415    BNP No results found for: BNP  ProBNP No results found for: PROBNP  Specialty Problems      Pulmonary Problems   ALLERGIC RHINITIS    Qualifier: Diagnosis of  By: Marshell Levan MD, [place]        Restrictive lung disease   Pulmonary nodule    4 mm right middle lobe nodule noted on high-resolution CT done on February 06, 2017      Dyspnea on exertion   Elevated hemidiaphragm   Nocturnal hypoxia      Allergies  Allergen Reactions  . Acyclovir And Related   . Metoprolol Itching  . Food Rash and Other (See Comments)    Pt states that she is allergic to pickles.  Immunization History  Administered Date(s) Administered  . Fluad Quad(high Dose 65+) 12/27/2019  . H1N1 03/26/2008  . Influenza Split 11/25/2010, 01/19/2012  . Influenza Whole 02/09/2006, 12/10/2007, 04/16/2009, 01/07/2010  . Influenza,inj,Quad PF,6+ Mos 02/15/2013, 12/09/2014, 01/12/2016, 11/22/2016, 11/14/2017  . PFIZER(Purple Top)SARS-COV-2 Vaccination 04/13/2019, 05/04/2019  . Pneumococcal Conjugate-13 09/04/2014  . Pneumococcal Polysaccharide-23 09/10/2010  . Tdap 11/25/2010    Past Medical History:  Diagnosis Date  . Allergic rhinitis, cause unspecified   . Asthma   . Cancer ([place])   . Chronic back pain   . Coronary artery calcification seen on CAT scan 04/21/2017  . Enlarged pulmonary artery ([place]) 04/21/2017   By chest CT  . Frequent urination 11/14/2017  . GERD (gastroesophageal reflux disease) 05/19/2011  . history of Bilateral leg edema-likely amlodipine induced. 07/28/2011  . history of CAP (community acquired pneumonia) 07/14/2011  . history of HYPOTHYROIDISM, BORDERLINE 03/20/2006   Annotation: Asymptomatic, untreated Qualifier: Diagnosis of  By: Tomasa Hosteller MD, Edmon Crape.   . Hx of breast cancer   . Hyperlipidemia   . Hypertension   . Hypothyroidism   . Lumbago   . Murmur, cardiac 11/22/2016  . OBESITY NOS 03/20/2006   Qualifier: Diagnosis  of  By: Tomasa Hosteller MD, [place] 12/13/2005   Annotation: bilateral knees;right knee injection 6/05 Qualifier: Diagnosis of  By: Tomasa Hosteller MD, Veronique D.   . right shoulder pain, likely Impingement syndrome  09/09/2010  . VENOUS INSUFFICIENCY, CHRONIC 03/20/2006   Qualifier: Diagnosis of  By: Tomasa Hosteller MD, Edmon Crape.     Tobacco History: Social History   Tobacco Use  Smoking Status Former Smoker  . Packs/day: 0.25  . Years: 0.50  . Pack years: 0.12  . Types: Cigarettes  . Quit date: 09/09/1958  . Years since quitting: 61.6  Smokeless Tobacco Never Used  Tobacco Comment   social smoker   Counseling given: Not Answered Comment: social smoker   Continue to not smoke  Outpatient Encounter Medications as of 03/10/2020  Medication Sig  . albuterol (VENTOLIN HFA) 108 (90 Base) MCG/ACT inhaler INHALE 2 PUFFS INTO THE LUNGS EVERY 4 HOURS AS NEEDED FOR WHEEZING FOR SHORTNESS OF BREATH  . aspirin EC 81 MG tablet Take 81 mg by mouth daily.  . calcium citrate-vitamin D (CITRACAL+D) 315-200 MG-UNIT tablet Take 1 tablet by mouth 2 (two) times daily.  . Cyanocobalamin (VITAMIN B12 PO) Take 1 tablet daily by mouth.  . diclofenac Sodium (VOLTAREN) 1 % GEL Apply 2 g topically 4 (four) times daily.  . diclofenac Sodium (VOLTAREN) 1 % GEL APPLY 2 GRAMS  TOPICALLY AS NEEDED  . ferrous sulfate 325 (65 FE) MG tablet Take 1 tablet (325 mg total) by mouth daily.  . fluticasone furoate-vilanterol (BREO ELLIPTA) 200-25 MCG/INH AEPB Inhale 1 puff into the lungs daily.  . [DISCONTINUED] acetaminophen-codeine (TYLENOL #3) 300-30 MG tablet Take 1 tablet by mouth every 4 (four) hours as needed for moderate pain.  . [DISCONTINUED] amLODipine (NORVASC) 5 MG tablet Take 1 tablet (5 mg total) by mouth daily.  . [DISCONTINUED] atorvastatin (LIPITOR) 40 MG tablet Take 1 tablet (40 mg total) by mouth daily.  . [DISCONTINUED] carvedilol (COREG) 25 MG tablet TAKE 1 TABLET BY MOUTH TWICE DAILY WITH A  MEAL  . [DISCONTINUED] levothyroxine (SYNTHROID) 25 MCG tablet Take 0.5 tablets (12.5 mcg total) by mouth daily before breakfast.  . [DISCONTINUED] losartan (COZAAR) 100 MG tablet Take 1 tablet (100 mg total) by mouth daily.  . [DISCONTINUED] pantoprazole (PROTONIX) 40 MG tablet  Take 1 tablet (40 mg total) by mouth daily.   No facility-administered encounter medications on file as of 03/10/2020.     Review of Systems  Review of Systems  Constitutional: Positive for fatigue. Negative for activity change and fever.  HENT: Negative for sinus pressure, sinus pain and sore throat.   Respiratory: Positive for cough (rare ), shortness of breath (with exertion ) and wheezing.   Cardiovascular: Negative for chest pain, palpitations and leg swelling.  Gastrointestinal: Negative for diarrhea, nausea and vomiting.  Musculoskeletal: Negative for arthralgias.  Neurological: Negative for dizziness.  Psychiatric/Behavioral: Negative for sleep disturbance. The patient is not nervous/anxious.      Physical Exam  BP 128/74 (BP Location: Left Arm, Cuff Size: Normal)   Pulse 74   Temp (!) 97.3 F (36.3 C) (Oral)   Ht 5\' 2"  (1.575 m)   Wt 247 lb 6.4 oz (112.2 kg)   SpO2 95%   BMI 45.25 kg/m   Wt Readings from Last 5 Encounters:  03/10/20 247 lb 6.4 oz (112.2 kg)  01/01/20 248 lb 12.8 oz (112.9 kg)  12/27/19 249 lb 3.2 oz (113 kg)  06/25/19 252 lb 11.2 oz (114.6 kg)  03/26/19 253 lb (114.8 kg)    BMI Readings from Last 5 Encounters:  03/10/20 45.25 kg/m  01/01/20 45.51 kg/m  12/27/19 45.58 kg/m  06/25/19 46.22 kg/m  03/26/19 46.27 kg/m     Physical Exam Vitals and nursing note reviewed.  Constitutional:      General: She is not in acute distress.    Appearance: Normal appearance. She is obese.  HENT:     Head: Normocephalic and atraumatic.     Right Ear: External ear normal.     Left Ear: External ear normal.     Nose: Nose normal. No congestion.     Mouth/Throat:     Mouth:  Mucous membranes are moist.     Pharynx: Oropharynx is clear.  Eyes:     Pupils: Pupils are equal, round, and reactive to light.  Cardiovascular:     Rate and Rhythm: Normal rate and regular rhythm.     Pulses: Normal pulses.     Heart sounds: Normal heart sounds. No murmur heard.   Pulmonary:     Effort: No respiratory distress.     Breath sounds: No decreased air movement. Examination of the left-lower field reveals decreased breath sounds. Decreased breath sounds and wheezing (slight exp wheeze ) present. No rales.  Musculoskeletal:     Cervical back: Normal range of motion.  Skin:    General: Skin is warm and dry.     Capillary Refill: Capillary refill takes less than 2 seconds.  Neurological:     General: No focal deficit present.     Mental Status: She is alert and oriented to person, place, and time. Mental status is at baseline.     Gait: Gait normal.  Psychiatric:        Mood and Affect: Mood normal.        Behavior: Behavior normal.        Thought Content: Thought content normal.        Judgment: Judgment normal.       Assessment & Plan:   Coronary artery calcification seen on CAT scan Plan: Follow-up with cardiology last seen in 2019 Coronary artery calcifications continue to be seen on CT imaging  Elevated hemidiaphragm Chronic left hemidiaphragm This is likely a contributing component to patient's shortness of breath Could consider sleep  study to see if patient may qualify for CPAP therapy, patient declined this today  Nocturnal hypoxia nocturnal hypoxemia based off of [place] on 02/12/2020 Patient was never started on O2 Discussed case with PCC's today, order to be sent again to DME team to get patient started on O2 Start 2 L of O2 at night  Restrictive lung disease Severe left elevated hemidiaphragm BMI 45  Plan: Continue clinically monitor Work to reduce BMI Could consider sleep study in the future, patient declined this today  Dyspnea on  exertion Dyspnea on exertion is almost certainly multifactorial given severe elevated left hemidiaphragm, BMI of 45, physical deconditioning, restrictive lung disease on pulmonary function testing, lab work favoring asthmatic component.  Previous pulmonary function testing does show mid flow reversibility  Plan: Trial of Breo Ellipta 200 Continue rescue inhaler Work on reducing BMI Follow-up in 4 weeks Could consider sleep study in the future to see if Pap therapy may be able to benefit patient with left elevated hemidiaphragm  Healthcare maintenance Up-to-date with COVID-19 vaccinations  Severe obesity (BMI >= 40) (HCC) BMI 45.2  Plan: Continue to work with primary care on working to reduce your BMI    Return in about 4 weeks (around 04/07/2020), or if symptoms worsen or fail to improve, for Follow up with Wyn Quaker FNP-C.   Lauraine Rinne, NP 04/24/2020   This appointment required 42 minutes of patient care (this includes precharting, chart review, review of results, face-to-face care, etc.).

## 2020-03-10 NOTE — Assessment & Plan Note (Addendum)
nocturnal hypoxemia based off of Ono on 02/12/2020 Patient was never started on O2 Discussed case with PCC's today, order to be sent again to DME team to get patient started on O2 Start 2 L of O2 at night

## 2020-03-10 NOTE — Assessment & Plan Note (Signed)
Dyspnea on exertion is almost certainly multifactorial given severe elevated left hemidiaphragm, BMI of 45, physical deconditioning, restrictive lung disease on pulmonary function testing, lab work favoring asthmatic component.  Previous pulmonary function testing does show mid flow reversibility  Plan: Trial of Breo Ellipta 200 Continue rescue inhaler Work on reducing BMI Follow-up in 4 weeks Could consider sleep study in the future to see if Pap therapy may be able to benefit patient with left elevated hemidiaphragm

## 2020-03-10 NOTE — Progress Notes (Signed)
Spirometry and DLCO attempted today   PF

## 2020-03-10 NOTE — Assessment & Plan Note (Signed)
Plan: Follow-up with cardiology last seen in 2019 Coronary artery calcifications continue to be seen on CT imaging

## 2020-03-10 NOTE — Assessment & Plan Note (Signed)
Up-to-date with COVID-19 vaccinations

## 2020-03-10 NOTE — Assessment & Plan Note (Signed)
Severe left elevated hemidiaphragm BMI 45  Plan: Continue clinically monitor Work to reduce BMI Could consider sleep study in the future, patient declined this today

## 2020-03-11 DIAGNOSIS — J8410 Pulmonary fibrosis, unspecified: Secondary | ICD-10-CM | POA: Diagnosis not present

## 2020-03-22 ENCOUNTER — Other Ambulatory Visit: Payer: Self-pay | Admitting: Internal Medicine

## 2020-03-22 DIAGNOSIS — I10 Essential (primary) hypertension: Secondary | ICD-10-CM

## 2020-03-27 ENCOUNTER — Other Ambulatory Visit: Payer: Self-pay

## 2020-03-27 DIAGNOSIS — M5441 Lumbago with sciatica, right side: Secondary | ICD-10-CM

## 2020-03-27 MED ORDER — ACETAMINOPHEN-CODEINE #3 300-30 MG PO TABS: 1.0000 | ORAL_TABLET | ORAL | 0 refills | Status: DC | PRN

## 2020-03-27 NOTE — Telephone Encounter (Signed)
Need refill on acetaminophen-codeine (TYLENOL #3) 300-30 MG tablet  ;pt contact [phone]   [id number] - [place] ([place]), [place] - [address] 

## 2020-03-30 ENCOUNTER — Telehealth: Payer: Self-pay

## 2020-03-30 NOTE — Telephone Encounter (Signed)
Pt is requesting her losartan (COZAAR) 100 MG tablet sent to  [place] ([place]), [place] Phone:  [phone]  Fax:  [phone]

## 2020-03-30 NOTE — Telephone Encounter (Signed)
#  90 with 3 refills sent 02/25/2020. Patient notified to call pharmacy for refill. Hubbard Hartshorn, BSN, RN-BC

## 2020-04-01 ENCOUNTER — Telehealth: Payer: Self-pay

## 2020-04-01 DIAGNOSIS — I10 Essential (primary) hypertension: Secondary | ICD-10-CM

## 2020-04-01 MED ORDER — LOSARTAN POTASSIUM 50 MG PO TABS: 100.0000 mg | ORAL_TABLET | Freq: Every day | ORAL | 0 refills | Status: DC

## 2020-04-01 NOTE — Telephone Encounter (Signed)
Sent!

## 2020-04-01 NOTE — Telephone Encounter (Signed)
Received following fax from [facility] regarding Losartan 100mg  tablet:  "This RX is still on  Backorder, would you like to switch to 50mg  tablets?"  If so, please send new RX. Thank you, SChaplin, RN,BSN

## 2020-04-11 DIAGNOSIS — J8410 Pulmonary fibrosis, unspecified: Secondary | ICD-10-CM | POA: Diagnosis not present

## 2020-04-15 ENCOUNTER — Other Ambulatory Visit: Payer: Self-pay

## 2020-04-15 DIAGNOSIS — I10 Essential (primary) hypertension: Secondary | ICD-10-CM

## 2020-04-15 DIAGNOSIS — K219 Gastro-esophageal reflux disease without esophagitis: Secondary | ICD-10-CM

## 2020-04-15 DIAGNOSIS — E039 Hypothyroidism, unspecified: Secondary | ICD-10-CM

## 2020-04-15 DIAGNOSIS — E7800 Pure hypercholesterolemia, unspecified: Secondary | ICD-10-CM

## 2020-04-15 MED ORDER — AMLODIPINE BESYLATE 5 MG PO TABS: 5.0000 mg | ORAL_TABLET | Freq: Every day | ORAL | 3 refills | Status: DC

## 2020-04-15 MED ORDER — PANTOPRAZOLE SODIUM 40 MG PO TBEC: 40.0000 mg | DELAYED_RELEASE_TABLET | Freq: Every day | ORAL | 3 refills | Status: DC

## 2020-04-15 MED ORDER — ATORVASTATIN CALCIUM 40 MG PO TABS: 40.0000 mg | ORAL_TABLET | Freq: Every day | ORAL | 3 refills | Status: DC

## 2020-04-15 MED ORDER — CARVEDILOL 25 MG PO TABS: ORAL_TABLET | ORAL | 3 refills | Status: DC

## 2020-04-15 MED ORDER — LEVOTHYROXINE SODIUM 25 MCG PO TABS: 12.5000 ug | ORAL_TABLET | Freq: Every day | ORAL | 3 refills | Status: DC

## 2020-04-15 NOTE — Telephone Encounter (Signed)
Pt is requesting her carvedilol (COREG) 25 MG tablet ,pantoprazole (PROTONIX) 40 MG tablet and atorvastatin (LIPITOR) 40 MG tablet  [place] ([place]), [place] Phone:  [phone]  Fax:  [phone]      ( pt stated that her  Medications has no refilled left and she is out  She took the last one this morning 04/15/20 )

## 2020-04-23 ENCOUNTER — Ambulatory Visit: Payer: Medicare HMO | Admitting: Pulmonary Disease

## 2020-04-23 NOTE — Progress Notes (Signed)
@Patient  ID: [name], female    DOB: 1936-01-08, 85 y.o.   MRN: [id number]  Chief Complaint  Patient presents with  . Follow-up    SOB improving slightly     Referring provider: Aldine Contes, MD  HPI:  85 year old female former smoker found our office for dyspnea on exertion  PMH: Hyperlipidemia, hypertension, hypothyroidism, severe obesity, GERD, anemia,  Smoker/ Smoking History: Former smoker.  Quit 1960. Maintenance: None Pt of: Dr. Chase Caller   04/24/2020  - Visit   85 year old female former smoker followed in our office for dyspnea on exertion, restrictive lung disease, pulmonary nodule, nocturnal hypoxia.Last seen by me on 03/10/2020,  Plan of care from the office visit was as follows: Chronic left hemidiaphragm, likely a contributing component to patient's shortness of breath. Could consider sleep study to see if patient may qualify for CPAP therapy, patient declined, Order to be sent again to DME team to get patient started on  2 L of O2 at night. Trial of Breo Ellipta 200.   Patient report today for follow up verbalizes that she is feeling some better. Breo was effective but she ran out about 2 weeks ago. She was given 2 samples at last visit. Will address today. Reports she stopped using oxygen 2L on 04/15/20 was she states it causes her a headache upon awaking. No recent ABX/Steroids. Per her report she has made some improvement with increasing her activity.    Questionaires / Pulmonary Flowsheets:   ACT:  No flowsheet data found.  MMRC: mMRC Dyspnea Scale mMRC Score  03/10/2020 3    Epworth:  No flowsheet data found.  Tests:   FENO:  No results found for: NITRICOXIDE  PFT: PFT Results Latest Ref Rng & Units 03/10/2020 01/04/2017  FVC-Pre L 0.98 1.22  FVC-Predicted Pre % 56 66  FVC-Post L - 1.31  FVC-Predicted Post % - 70  Pre FEV1/FVC % % 79 81  Post FEV1/FCV % % - 87  FEV1-Pre L 0.78 0.99  FEV1-Predicted Pre % 58 69  FEV1-Post L - 1.13  DLCO  uncorrected ml/min/mmHg 7.03 10.30  DLCO UNC% % 40 47  DLCO corrected ml/min/mmHg 7.03 -  DLCO COR %Predicted % 40 -  DLVA Predicted % 105 92  TLC L - 2.93  TLC % Predicted % - 61  RV % Predicted % - 55    WALK:  SIX MIN WALK 01/15/2020 01/16/2017  Medications amlodipine 5mg , ASA 81mg , atorvastatin 40mg , calcium citrate-Vit D 315-200mg -unit, carvedilol 25mg , Vit B-12, ferrous sulfate 325mg , levothyroxine 18mcg, losartan potassium 100mg , pantoprazole 40mg  all at 9:30 -  Supplimental Oxygen during Test? (L/min) No No  Laps 2 -  Partial Lap (in Meters) 0 -  Baseline BP (sitting) 124/74 -  Baseline Heartrate 71 -  Baseline Dyspnea (Borg Scale) 2 -  Baseline Fatigue (Borg Scale) 1 -  Baseline SPO2 97 -  BP (sitting) 130/90 -  Heartrate 81 -  Dyspnea (Borg Scale) 7 -  Fatigue (Borg Scale) 6 -  SPO2 90 -  BP (sitting) 128/80 -  Heartrate 66 -  SPO2 96 -  Stopped or Paused before Six Minutes Yes -  Other Symptoms at end of Exercise Patient had to stop and pause with 4 min 42 sec remaining for about 30 seconds to catch her breath but then had to fully stop walk with 3 min 38 seconds remaining due to breathing -  Distance Completed 68 -  Distance Completed 0 -  Tech Comments: Patient walked  at a slow pace with a cane having to stop once at 63min 42 sec mark to catch breath but then had to fully stop walk with 76min 38sec remaining due to breathing. Pt walked with a cane and only able to complete one lap due to becoming very SOB with wheezing.    Imaging: No results found.  Lab Results:  CBC    Component Value Date/Time   WBC 7.1 01/01/2020 1144   RBC 4.28 01/01/2020 1144   HGB 12.0 01/01/2020 1144   HGB 11.7 06/25/2019 1020   HCT 37.0 01/01/2020 1144   HCT 36.9 06/25/2019 1020   PLT 185.0 01/01/2020 1144   PLT 204 06/25/2019 1020   MCV 86.3 01/01/2020 1144   MCV 84 06/25/2019 1020   MCH 26.7 06/25/2019 1020   MCH 26.9 12/01/2018 1655   MCHC 32.4 01/01/2020 1144   RDW 14.7  01/01/2020 1144   RDW 13.4 06/25/2019 1020   LYMPHSABS 1.3 01/01/2020 1144   LYMPHSABS 1.3 11/20/2018 1023   MONOABS 0.7 01/01/2020 1144   EOSABS 0.6 01/01/2020 1144   EOSABS 0.4 11/20/2018 1023   BASOSABS 0.0 01/01/2020 1144   BASOSABS 0.1 11/20/2018 1023    BMET    Component Value Date/Time   NA 143 12/27/2019 1028   K 3.7 12/27/2019 1028   CL 103 12/27/2019 1028   CO2 25 12/27/2019 1028   GLUCOSE 110 (H) 12/27/2019 1028   GLUCOSE 127 (H) 12/01/2018 1655   BUN 18 12/27/2019 1028   CREATININE 0.94 12/27/2019 1028   CREATININE 0.91 09/23/2014 1415   CALCIUM 9.6 12/27/2019 1028   GFRNONAA 57 (L) 12/27/2019 1028   GFRNONAA 61 09/23/2014 1415   GFRAA 66 12/27/2019 1028   GFRAA 71 09/23/2014 1415    BNP No results found for: BNP  ProBNP No results found for: PROBNP  Specialty Problems      Pulmonary Problems   ALLERGIC RHINITIS    Qualifier: Diagnosis of  By: Marshell Levan MD, [place]        Restrictive lung disease   Pulmonary nodule    4 mm right middle lobe nodule noted on high-resolution CT done on February 06, 2017      Dyspnea on exertion   Elevated hemidiaphragm   Nocturnal hypoxia      Allergies  Allergen Reactions  . Acyclovir And Related   . Metoprolol Itching  . Food Rash and Other (See Comments)    Pt states that she is allergic to pickles.      Immunization History  Administered Date(s) Administered  . Fluad Quad(high Dose 65+) 12/27/2019  . H1N1 03/26/2008  . Influenza Split 11/25/2010, 01/19/2012  . Influenza Whole 02/09/2006, 12/10/2007, 04/16/2009, 01/07/2010  . Influenza,inj,Quad PF,6+ Mos 02/15/2013, 12/09/2014, 01/12/2016, 11/22/2016, 11/14/2017  . PFIZER(Purple Top)SARS-COV-2 Vaccination 04/13/2019, 05/04/2019  . Pneumococcal Conjugate-13 09/04/2014  . Pneumococcal Polysaccharide-23 09/10/2010  . Tdap 11/25/2010    Past Medical History:  Diagnosis Date  . Allergic rhinitis, cause unspecified   . Asthma   . Cancer ([place])   .  Chronic back pain   . Coronary artery calcification seen on CAT scan 04/21/2017  . Enlarged pulmonary artery ([place]) 04/21/2017   By chest CT  . Frequent urination 11/14/2017  . GERD (gastroesophageal reflux disease) 05/19/2011  . history of Bilateral leg edema-likely amlodipine induced. 07/28/2011  . history of CAP (community acquired pneumonia) 07/14/2011  . history of HYPOTHYROIDISM, BORDERLINE 03/20/2006   Annotation: Asymptomatic, untreated Qualifier: Diagnosis of  By: Tomasa Hosteller MD, Edmon Crape.   Marland Kitchen  Hx of breast cancer   . Hyperlipidemia   . Hypertension   . Hypothyroidism   . Lumbago   . Murmur, cardiac 11/22/2016  . OBESITY NOS 03/20/2006   Qualifier: Diagnosis of  By: Tomasa Hosteller MD, [place] 12/13/2005   Annotation: bilateral knees;right knee injection 6/05 Qualifier: Diagnosis of  By: Tomasa Hosteller MD, Veronique D.   . right shoulder pain, likely Impingement syndrome  09/09/2010  . VENOUS INSUFFICIENCY, CHRONIC 03/20/2006   Qualifier: Diagnosis of  By: Tomasa Hosteller MD, Edmon Crape.     Tobacco History: Social History   Tobacco Use  Smoking Status Former Smoker  . Packs/day: 0.25  . Years: 0.50  . Pack years: 0.12  . Types: Cigarettes  . Quit date: 09/09/1958  . Years since quitting: 61.6  Smokeless Tobacco Never Used  Tobacco Comment   social smoker   Counseling given: Not Answered Comment: social smoker   Continue to not smoke  Outpatient Encounter Medications as of 04/24/2020  Medication Sig  . acetaminophen-codeine (TYLENOL #3) 300-30 MG tablet Take 1 tablet by mouth every 4 (four) hours as needed for moderate pain.  Marland Kitchen albuterol (VENTOLIN HFA) 108 (90 Base) MCG/ACT inhaler INHALE 2 PUFFS INTO THE LUNGS EVERY 4 HOURS AS NEEDED FOR WHEEZING FOR SHORTNESS OF BREATH  . amLODipine (NORVASC) 5 MG tablet Take 1 tablet (5 mg total) by mouth daily.  Marland Kitchen aspirin EC 81 MG tablet Take 81 mg by mouth daily.  Marland Kitchen atorvastatin (LIPITOR) 40 MG tablet Take 1 tablet (40 mg total) by  mouth daily.  . calcium citrate-vitamin D (CITRACAL+D) 315-200 MG-UNIT tablet Take 1 tablet by mouth 2 (two) times daily.  . carvedilol (COREG) 25 MG tablet TAKE 1 TABLET BY MOUTH TWICE DAILY WITH A MEAL  . Cyanocobalamin (VITAMIN B12 PO) Take 1 tablet daily by mouth.  . diclofenac Sodium (VOLTAREN) 1 % GEL Apply 2 g topically 4 (four) times daily.  . diclofenac Sodium (VOLTAREN) 1 % GEL APPLY 2 GRAMS  TOPICALLY AS NEEDED  . ferrous sulfate 325 (65 FE) MG tablet Take 1 tablet (325 mg total) by mouth daily.  Marland Kitchen levothyroxine (SYNTHROID) 25 MCG tablet Take 0.5 tablets (12.5 mcg total) by mouth daily before breakfast.  . losartan (COZAAR) 50 MG tablet Take 2 tablets (100 mg total) by mouth daily.  . pantoprazole (PROTONIX) 40 MG tablet Take 1 tablet (40 mg total) by mouth daily.  . fluticasone furoate-vilanterol (BREO ELLIPTA) 200-25 MCG/INH AEPB Inhale 1 puff into the lungs daily.  . [DISCONTINUED] fluticasone furoate-vilanterol (BREO ELLIPTA) 200-25 MCG/INH AEPB Inhale 1 puff into the lungs daily. (Patient not taking: Reported on 04/24/2020)   No facility-administered encounter medications on file as of 04/24/2020.     Review of Systems  Review of Systems  Constitutional: Positive for activity change (improving). Negative for appetite change, fatigue and fever.  HENT: Negative for rhinorrhea, sinus pressure and sore throat.   Eyes: Negative.   Respiratory: Negative for cough, chest tightness and wheezing.   Skin: Negative.   Neurological: Positive for headaches (in AM after wearing O2 QHS).  Psychiatric/Behavioral: Negative.      Physical Exam  BP 124/82 (BP Location: Left Arm, Cuff Size: Large)   Pulse 80   Temp (!) 97.1 F (36.2 C) (Temporal)   Ht 5\' 2"  (1.575 m)   Wt 248 lb 3.2 oz (112.6 kg)   SpO2 98%   BMI 45.40 kg/m   Wt Readings from Last 5 Encounters:  04/24/20 248 lb  3.2 oz (112.6 kg)  03/10/20 247 lb 6.4 oz (112.2 kg)  01/01/20 248 lb 12.8 oz (112.9 kg)  12/27/19  249 lb 3.2 oz (113 kg)  06/25/19 252 lb 11.2 oz (114.6 kg)    BMI Readings from Last 5 Encounters:  04/24/20 45.40 kg/m  03/10/20 45.25 kg/m  01/01/20 45.51 kg/m  12/27/19 45.58 kg/m  06/25/19 46.22 kg/m     Physical Exam Constitutional:      Appearance: Normal appearance. She is obese.  HENT:     Head: Normocephalic.     Right Ear: Ear canal and external ear normal. There is impacted cerumen.     Left Ear: Tympanic membrane, ear canal and external ear normal. There is no impacted cerumen.     Nose: Nose normal.     Mouth/Throat:     Mouth: Mucous membranes are moist.     Pharynx: Oropharynx is clear.  Cardiovascular:     Rate and Rhythm: Normal rate and regular rhythm.     Pulses: Normal pulses.     Heart sounds: Murmur heard.    Pulmonary:     Effort: Pulmonary effort is normal.     Breath sounds: Examination of the left-middle field reveals decreased breath sounds. Examination of the left-lower field reveals decreased breath sounds. Decreased breath sounds present. No wheezing, rhonchi or rales.  Abdominal:     Palpations: Abdomen is soft.  Musculoskeletal:     Cervical back: Normal range of motion.  Skin:    General: Skin is warm and dry.  Neurological:     General: No focal deficit present.     Mental Status: She is alert and oriented to person, place, and time.  Psychiatric:        Mood and Affect: Mood normal.        Behavior: Behavior normal.        Thought Content: Thought content normal.       Assessment & Plan:   Restrictive lung disease She is trying to improve activity level. BMI 45.40 today. Left elevated hemidiaphragm. Today Pt. Is agreeable to sleep study.   Plan:  Continue to increase activity Split night sleep study  Elevated hemidiaphragm Left hemidiaphragm. Agreeable to sleep study.  Plan: Continue to work on reducing BMI Split night sleep study ordered    Nocturnal hypoxia Currently not using oxygen at night, last used on  04/15/20. States causes HA in morning when she uses it. Discussed need to rule out OSA as she is experiencing AM headaches. Agreeable to sleep study today.   Plan:  Discussed with patient O2 desats from ONO. O2 use encouraged  Split night sleep study ordered    Severe obesity (BMI >= 40) (HCC) BMI 45.40 today. Has increased activity some at home.  Plan:  Continue to improve activity  Continue to work with PCP to reduce BMI  Dyspnea on exertion Chronic Left hemidiaphragm. Stop using oxygen QHS due to morning headache. BMI 45.40. Instructed on how to use Breo inhaler.   Plan:  Resume Breo as directed Use rescue inhaler as needed Continue working to reduce BMI Split night sleep study ordered   At risk for obstructive sleep apnea Wakes in the mornings with headache when uses oxygen at night. Previous pulmonary function test showed mid flow reversibility. Restrictive lung disease on pulmonary function test. BMI 45.40.   Plan:  Split night sleep study ordered     Return in about 3 months (around 07/22/2020), or if symptoms worsen or fail  to improve, for Follow up with Dr. Purnell Shoemaker, Follow up with Wyn Quaker FNP-C.   Tula Nakayama, RN 04/24/2020   This appointment required 34 minutes of patient care (this includes precharting, chart review, review of results, face-to-face care, etc.).

## 2020-04-24 ENCOUNTER — Other Ambulatory Visit: Payer: Self-pay

## 2020-04-24 ENCOUNTER — Encounter: Payer: Self-pay | Admitting: Pulmonary Disease

## 2020-04-24 ENCOUNTER — Ambulatory Visit (INDEPENDENT_AMBULATORY_CARE_PROVIDER_SITE_OTHER): Payer: Medicare HMO | Admitting: Pulmonary Disease

## 2020-04-24 VITALS — BP 124/82 | HR 80 | Temp 97.1°F | Ht 62.0 in | Wt 248.2 lb

## 2020-04-24 DIAGNOSIS — J986 Disorders of diaphragm: Secondary | ICD-10-CM | POA: Diagnosis not present

## 2020-04-24 DIAGNOSIS — J984 Other disorders of lung: Secondary | ICD-10-CM | POA: Diagnosis not present

## 2020-04-24 DIAGNOSIS — Z9189 Other specified personal risk factors, not elsewhere classified: Secondary | ICD-10-CM | POA: Diagnosis not present

## 2020-04-24 DIAGNOSIS — G4734 Idiopathic sleep related nonobstructive alveolar hypoventilation: Secondary | ICD-10-CM

## 2020-04-24 DIAGNOSIS — R0609 Other forms of dyspnea: Secondary | ICD-10-CM

## 2020-04-24 DIAGNOSIS — R0600 Dyspnea, unspecified: Secondary | ICD-10-CM | POA: Diagnosis not present

## 2020-04-24 MED ORDER — BREO ELLIPTA 200-25 MCG/INH IN AEPB
1.0000 | INHALATION_SPRAY | Freq: Every day | RESPIRATORY_TRACT | 3 refills | Status: DC
Start: 2020-04-24 — End: 2020-06-29

## 2020-04-24 NOTE — Assessment & Plan Note (Addendum)
Wakes in the mornings with headache when uses oxygen at night. Previous pulmonary function test showed mid flow reversibility. Restrictive lung disease on pulmonary function test. BMI 45.40.   Plan:  Split night sleep study ordered

## 2020-04-24 NOTE — Assessment & Plan Note (Signed)
Chronic Left hemidiaphragm. Stop using oxygen QHS due to morning headache. BMI 45.40. Instructed on how to use Breo inhaler.   Plan:  Resume Breo as directed Use rescue inhaler as needed Continue working to reduce BMI Split night sleep study ordered

## 2020-04-24 NOTE — Patient Instructions (Addendum)
You were seen today by Lauraine Rinne, NP  for:   1. Dyspnea on exertion  Breo Ellipta 200 >>> Take 1 puff daily in the morning right when you wake up >>>Rinse your mouth out after use >>>This is a daily maintenance inhaler, NOT a rescue inhaler >>>Contact our office if you are having difficulties affording or obtaining this medication >>>It is important for you to be able to take this daily and not miss any doses   Only use your albuterol as a rescue medication to be used if you can't catch your breath by resting or doing a relaxed purse lip breathing pattern.  - The less you use it, the better it will work when you need it. - Ok to use up to 2 puffs  every 4 hours if you must but call for immediate appointment if use goes up over your usual need - Don't leave home without it !!  (think of it like the spare tire for your car)   2. Elevated hemidiaphragm 3. Nocturnal hypoxia 4. At risk for obstructive sleep apnea 5. Restrictive lung disease  - Split night study; Future  Continue oxygen therapy as prescribed - 2L of oxygen at night  >>>maintain oxygen saturations greater than 88 percent  >>>if unable to maintain oxygen saturations please contact the office  >>>do not smoke with oxygen  >>>can use nasal saline gel or nasal saline rinses to moisturize nose if oxygen causes dryness  6. Severe obesity (BMI >= 40) (HCC)  Continue to work with primary care on working to reduce your BMI next Remain physically active   We recommend today:  Orders Placed This Encounter  Procedures  . Split night study    Standing Status:   Future    Standing Expiration Date:   10/22/2020    Order Specific Question:   Where should this test be performed:    Answer:   [facility]   Orders Placed This Encounter  Procedures  . Split night study   No orders of the defined types were placed in this encounter.   Follow Up:    Return in about 2 months (around 06/22/2020), or if symptoms  worsen or fail to improve, for Follow up with Dr. Purnell Shoemaker, Follow up with Wyn Quaker FNP-C.   Notification of test results are managed in the following manner: If there are  any recommendations or changes to the  plan of care discussed in office today,  we will contact you and let you know what they are. If you do not hear from [place], then your results are normal and you can view them through your  MyChart account , or a letter will be sent to you. Thank you again for trusting [place] with your care  - Thank you, [facility]    It is flu season:   >>> Best ways to protect herself from the flu: Receive the yearly flu vaccine, practice good hand hygiene washing with soap and also using hand sanitizer when available, eat a nutritious meals, get adequate rest, hydrate appropriately       Please contact the office if your symptoms worsen or you have concerns that you are not improving.   Thank you for choosing [facility] for your healthcare, and for allowing [place] to partner with you on your healthcare journey. I am thankful to be able to provide care to you today.   Wyn Quaker FNP-C

## 2020-04-24 NOTE — Assessment & Plan Note (Signed)
Left hemidiaphragm. Agreeable to sleep study.  Plan: Continue to work on reducing BMI Split night sleep study ordered

## 2020-04-24 NOTE — Assessment & Plan Note (Signed)
Currently not using oxygen at night, last used on 04/15/20. States causes HA in morning when she uses it. Discussed need to rule out OSA as she is experiencing AM headaches. Agreeable to sleep study today.   Plan:  Discussed with patient O2 desats from ONO. O2 use encouraged  Split night sleep study ordered

## 2020-04-24 NOTE — Assessment & Plan Note (Addendum)
BMI 45.40 today. Has increased activity some at home.  Plan:  Continue to improve activity  Continue to work with PCP to reduce BMI

## 2020-04-24 NOTE — Assessment & Plan Note (Signed)
She is trying to improve activity level. BMI 45.40 today. Left elevated hemidiaphragm. Today Pt. Is agreeable to sleep study.   Plan:  Continue to increase activity Split night sleep study

## 2020-04-28 ENCOUNTER — Telehealth: Payer: Self-pay | Admitting: Internal Medicine

## 2020-04-28 NOTE — Telephone Encounter (Signed)
Assessment & Plan:   Restrictive lung disease She is trying to improve activity level. BMI 45.40 today. Left elevated hemidiaphragm. Today Pt. Is agreeable to sleep study.   Plan:  Continue to increase activity Split night sleep study  Elevated hemidiaphragm Left hemidiaphragm. Agreeable to sleep study.  Plan: Continue to work on reducing BMI Split night sleep study ordered  Called and spoke with pt letting her know that Breo 200 Rx was sent to [facility] instead of local pharmacy. Pt verbalized understanding. Nothing further needed.

## 2020-05-05 ENCOUNTER — Other Ambulatory Visit: Payer: Self-pay | Admitting: Internal Medicine

## 2020-05-05 DIAGNOSIS — J984 Other disorders of lung: Secondary | ICD-10-CM

## 2020-05-09 DIAGNOSIS — J8410 Pulmonary fibrosis, unspecified: Secondary | ICD-10-CM | POA: Diagnosis not present

## 2020-05-24 ENCOUNTER — Other Ambulatory Visit: Payer: Self-pay | Admitting: Internal Medicine

## 2020-06-09 DIAGNOSIS — J8410 Pulmonary fibrosis, unspecified: Secondary | ICD-10-CM | POA: Diagnosis not present

## 2020-06-12 ENCOUNTER — Encounter: Payer: Self-pay | Admitting: *Deleted

## 2020-06-12 NOTE — Progress Notes (Unsigned)

## 2020-06-15 ENCOUNTER — Ambulatory Visit (HOSPITAL_BASED_OUTPATIENT_CLINIC_OR_DEPARTMENT_OTHER): Payer: Medicare HMO | Attending: Pulmonary Disease | Admitting: Pulmonary Disease

## 2020-06-15 ENCOUNTER — Other Ambulatory Visit: Payer: Self-pay

## 2020-06-15 DIAGNOSIS — G4736 Sleep related hypoventilation in conditions classified elsewhere: Secondary | ICD-10-CM | POA: Diagnosis not present

## 2020-06-15 DIAGNOSIS — R0902 Hypoxemia: Secondary | ICD-10-CM | POA: Diagnosis not present

## 2020-06-15 DIAGNOSIS — Z9189 Other specified personal risk factors, not elsewhere classified: Secondary | ICD-10-CM

## 2020-06-15 DIAGNOSIS — G4733 Obstructive sleep apnea (adult) (pediatric): Secondary | ICD-10-CM | POA: Diagnosis not present

## 2020-06-22 DIAGNOSIS — Z9189 Other specified personal risk factors, not elsewhere classified: Secondary | ICD-10-CM

## 2020-06-22 DIAGNOSIS — H25811 Combined forms of age-related cataract, right eye: Secondary | ICD-10-CM | POA: Diagnosis not present

## 2020-06-22 DIAGNOSIS — H3582 Retinal ischemia: Secondary | ICD-10-CM | POA: Diagnosis not present

## 2020-06-22 DIAGNOSIS — H26492 Other secondary cataract, left eye: Secondary | ICD-10-CM | POA: Diagnosis not present

## 2020-06-22 DIAGNOSIS — G4733 Obstructive sleep apnea (adult) (pediatric): Secondary | ICD-10-CM

## 2020-06-22 DIAGNOSIS — H4052X2 Glaucoma secondary to other eye disorders, left eye, moderate stage: Secondary | ICD-10-CM | POA: Diagnosis not present

## 2020-06-22 DIAGNOSIS — H34832 Tributary (branch) retinal vein occlusion, left eye, with macular edema: Secondary | ICD-10-CM | POA: Diagnosis not present

## 2020-06-22 DIAGNOSIS — H31092 Other chorioretinal scars, left eye: Secondary | ICD-10-CM | POA: Diagnosis not present

## 2020-06-22 NOTE — Procedures (Signed)
    Patient Name: [name], [name] Date: 06/15/2020 Gender: Female D.O.B: 22-Jan-1936 Age (years): 21 Referring Provider: Lauraine Rinne NP Height (inches): 22 Interpreting Physician: Chesley Mires MD, ABSM Weight (lbs): 248 RPSGT: Gwenyth Allegra BMI: 91 MRN: [id number] Neck Size: 15.00  CLINICAL INFORMATION Sleep Study Type: NPSG  Indication for sleep study: History of restrictive lung disease, nocturnal hypoxia.  Referred for assessment of sleep disordered breathing.  SLEEP STUDY TECHNIQUE As per the AASM Manual for the Scoring of Sleep and Associated Events v2.3 (April 2016) with a hypopnea requiring 4% desaturations.  The channels recorded and monitored were frontal, central and occipital EEG, electrooculogram (EOG), submentalis EMG (chin), nasal and oral airflow, thoracic and abdominal wall motion, anterior tibialis EMG, snore microphone, electrocardiogram, and pulse oximetry.  MEDICATIONS Medications self-administered by patient taken the night of the study : N/A  SLEEP ARCHITECTURE The study was initiated at 10:41:34 PM and ended at 4:46:37 AM.  Sleep onset time was 64.4 minutes and the sleep efficiency was 30.7%%. The total sleep time was 112 minutes.  Stage REM latency was N/A minutes.  The patient spent 15.2%% of the night in stage N1 sleep, 84.8%% in stage N2 sleep, 0.0%% in stage N3 and 0% in REM.  Alpha intrusion was absent.  Supine sleep was 0.00%.  RESPIRATORY PARAMETERS The overall apnea/hypopnea index (AHI) was 8.6 per hour. There were 0 total apneas, including 0 obstructive, 0 central and 0 mixed apneas. There were 16 hypopneas and 9 RERAs.  The AHI during Stage REM sleep was N/A per hour.  AHI while supine was N/A per hour.  The mean oxygen saturation was 88.8%. The minimum SpO2 during sleep was 83.0%.  She spent 83 minutes of total sleep time with an SpO2 < 88%.  soft snoring was noted during this study.  CARDIAC DATA The 2 lead EKG demonstrated  sinus rhythm. The mean heart rate was 67.3 beats per minute. Other EKG findings include: None.  LEG MOVEMENT DATA The total PLMS were 0 with a resulting PLMS index of 0.0. Associated arousal with leg movement index was 0.0 .  IMPRESSIONS - This study is suggestive of mild obstructive sleep apnea.  She had an AHI of 8.6 per hour with an SpO2 low of 83%.  However, she had minimal sleep time due to difficulty falling asleep. - She spent 83 minutes of test time with an SpO2 < 88%.  This is consistent with sleep related hypoxia/hypoventilation.  Supplemental oxygen was not applied during this study. - The patient snored with soft snoring volume.  DIAGNOSIS - Obstructive Sleep Apnea (G47.33) - Nocturnal Hypoxemia (G47.36)  RECOMMENDATIONS - While she likely has clinically significant obstructive sleep apnea, arranging for therapy through medical insurance might be difficult based on limited sleep time during this study. - Option could be to repeat the in lab sleep study with the use of a sleep aide medication on the night of the study. - Avoid alcohol, sedatives and other CNS depressants that may worsen sleep apnea and disrupt normal sleep architecture. - Sleep hygiene should be reviewed to assess factors that may improve sleep quality. - Weight management and regular exercise should be initiated or continued if appropriate.  [Electronically signed] 06/22/2020 09:47 AM  Chesley Mires MD, ABSM Diplomate, American Board of Sleep Medicine   NPI: [id number]

## 2020-06-23 ENCOUNTER — Encounter: Payer: Self-pay | Admitting: *Deleted

## 2020-06-25 ENCOUNTER — Other Ambulatory Visit: Payer: Self-pay

## 2020-06-25 DIAGNOSIS — D649 Anemia, unspecified: Secondary | ICD-10-CM

## 2020-06-25 MED ORDER — FERROUS SULFATE 325 (65 FE) MG PO TABS: 325.0000 mg | ORAL_TABLET | Freq: Every day | ORAL | 1 refills | Status: DC

## 2020-06-25 NOTE — Telephone Encounter (Signed)
  ferrous sulfate 325 (65 FE) MG tablet, refill request @  [facility] ([address] [place] Phone:  [phone]  Fax:  [phone]

## 2020-06-25 NOTE — Progress Notes (Unsigned)
Things That May Be Affecting Your Health:  Alcohol  Hearing loss  Pain    Depression  Home Safety  Sexual Health   Diabetes x Lack of physical activity  Stress   Difficulty with daily activities  Loneliness  Tiredness   Drug use  Medicines  Tobacco use   Falls  Motor Vehicle Safety x Weight  x Food choices  Oral Health  Other    YOUR PERSONALIZED HEALTH PLAN : 1. Schedule your next subsequent Medicare Wellness visit in one year 2. Attend all of your regular appointments to address your medical issues 3. Complete the preventative screenings and services   Annual Wellness Visit   Medicare Covered Preventative Screenings and [place] Men and Women Who How Often Need? Date of Last Service Action  Abdominal Aortic Aneurysm Adults with AAA risk factors Once      Alcohol Misuse and Counseling All Adults Screening once a year if no alcohol misuse. Counseling up to 4 face to face sessions.     Bone Density Measurement  Adults at risk for osteoporosis Once every 2 yrs      Lipid Panel Z13.6 All adults without CV disease Once every 5 yrs       Colorectal Cancer   Stool sample or  Colonoscopy All adults 43 and older   Once every year  Every 10 years        Depression All Adults Once a year  Today   Diabetes Screening Blood glucose, post glucose load, or GTT Z13.1  All adults at risk  Pre-diabetics  Once per year  Twice per year      Diabetes  Self-Management Training All adults Diabetics 10 hrs first year; 2 hours subsequent years. Requires Copay     Glaucoma  Diabetics  Family history of glaucoma  African Americans 87 yrs +  Hispanic Americans 43 yrs + Annually - requires coppay      Hepatitis C Z72.89 or F19.20  High Risk for HCV  Born between 1945 and 1965  Annually  Once      HIV Z11.4 All adults based on risk  Annually btw ages 17 & 87 regardless of risk  Annually > 65 yrs if at increased risk      Lung Cancer Screening  Asymptomatic adults aged 44-77 with 30 pack yr history and current smoker OR quit within the last 15 yrs Annually Must have counseling and shared decision making documentation before first screen      Medical Nutrition Therapy Adults with   Diabetes  Renal disease  Kidney transplant within past 3 yrs 3 hours first year; 2 hours subsequent years     Obesity and Counseling All adults Screening once a year Counseling if BMI 30 or higher  Today   Tobacco Use Counseling Adults who use tobacco  Up to 8 visits in one year     Vaccines Z23  Hepatitis B  Influenza   Pneumonia  Adults   Once  Once every flu season  Two different vaccines separated by one year X  Covid booster    Next Annual Wellness Visit People with Medicare Every year  Today     Services & Screenings Women Who How Often Need  Date of Last Service Action  Mammogram  Z12.31 Women over 49 One baseline ages 49-39. Annually ager 40 yrs+      Pap tests All women Annually if high risk. Every 2 yrs for normal risk women  Screening for cervical cancer with   Pap (Z01.419 nl or Z01.411abnl) &  HPV Z11.51 Women aged 28 to 38 Once every 5 yrs     Screening pelvic and breast exams All women Annually if high risk. Every 2 yrs for normal risk women     Sexually Transmitted Diseases  Chlamydia  Gonorrhea  Syphilis All at risk adults Annually for non pregnant females at increased risk         [place] Men Who How Ofter Need  Date of Last Service Action  Prostate Cancer - DRE & PSA Men over 50 Annually.  DRE might require a copay.        Sexually Transmitted Diseases  Syphilis All at risk adults Annually for men at increased risk      Health Maintenance List Health Maintenance  Topic Date Due  . COVID-19 Vaccine (3 - Booster for Pfizer series) 11/01/2019  . INFLUENZA VACCINE  10/05/2020  . TETANUS/TDAP  11/24/2020  . DEXA SCAN  Completed  . PNA vac Low Risk Adult  Completed  . HPV  VACCINES  Aged Out

## 2020-06-29 ENCOUNTER — Other Ambulatory Visit: Payer: Self-pay | Admitting: Pulmonary Disease

## 2020-07-08 DIAGNOSIS — H34832 Tributary (branch) retinal vein occlusion, left eye, with macular edema: Secondary | ICD-10-CM | POA: Diagnosis not present

## 2020-07-08 DIAGNOSIS — H353112 Nonexudative age-related macular degeneration, right eye, intermediate dry stage: Secondary | ICD-10-CM | POA: Diagnosis not present

## 2020-07-08 DIAGNOSIS — H35363 Drusen (degenerative) of macula, bilateral: Secondary | ICD-10-CM | POA: Diagnosis not present

## 2020-07-08 DIAGNOSIS — H3582 Retinal ischemia: Secondary | ICD-10-CM | POA: Diagnosis not present

## 2020-07-08 DIAGNOSIS — H25811 Combined forms of age-related cataract, right eye: Secondary | ICD-10-CM | POA: Diagnosis not present

## 2020-07-08 DIAGNOSIS — H35032 Hypertensive retinopathy, left eye: Secondary | ICD-10-CM | POA: Diagnosis not present

## 2020-07-08 DIAGNOSIS — H4052X3 Glaucoma secondary to other eye disorders, left eye, severe stage: Secondary | ICD-10-CM | POA: Diagnosis not present

## 2020-07-09 DIAGNOSIS — J8410 Pulmonary fibrosis, unspecified: Secondary | ICD-10-CM | POA: Diagnosis not present

## 2020-07-14 ENCOUNTER — Encounter: Payer: Medicare HMO | Admitting: Internal Medicine

## 2020-07-14 ENCOUNTER — Telehealth: Payer: Self-pay | Admitting: Internal Medicine

## 2020-07-14 NOTE — Telephone Encounter (Signed)
Called and spoke with Patient.  Patient stated she had her split night 06/15/20 and would like results.  Message routed to [facility] to advise on results

## 2020-07-16 NOTE — Telephone Encounter (Signed)
Apologize for the delay.  [name] ordered this.  I do not do sleep studies and so I was not aware  Result this is below  -  RECOMMENDATIONS - While she likely has clinically significant obstructive sleep apnea, arranging for therapy through medical insurance might be difficult based on limited sleep time during this study. - Option could be to repeat the in lab sleep study with the use of a sleep aide medication on the night of the study. - Avoid alcohol, sedatives and other CNS depressants that may worsen sleep apnea and disrupt normal sleep architecture. - Sleep hygiene should be reviewed to assess factors that may improve sleep quality. - Weight management and regular exercise should be initiated or continued if appropriate.  [Electronically signed] 06/22/2020 09:47 AM  Natalie Mires MD, ABSM Diplomate, American Board of Sleep Medicine  Plan  - Refer to Dr. Halford Chessman who read the sleep study for evaluation

## 2020-07-16 NOTE — Telephone Encounter (Signed)
I called and spoke with patient regarding HST results and recs. Patient verbalized  understanding and is agreeable to sleep consult with Dr. Halford Chessman who read the study. I made an appt with dr. Halford Chessman on 09/01/20. Nothing further needed.

## 2020-07-21 ENCOUNTER — Ambulatory Visit (INDEPENDENT_AMBULATORY_CARE_PROVIDER_SITE_OTHER): Payer: Medicare HMO | Admitting: Internal Medicine

## 2020-07-21 ENCOUNTER — Other Ambulatory Visit: Payer: Self-pay

## 2020-07-21 DIAGNOSIS — R0600 Dyspnea, unspecified: Secondary | ICD-10-CM

## 2020-07-21 DIAGNOSIS — I10 Essential (primary) hypertension: Secondary | ICD-10-CM | POA: Diagnosis not present

## 2020-07-21 DIAGNOSIS — E039 Hypothyroidism, unspecified: Secondary | ICD-10-CM | POA: Diagnosis not present

## 2020-07-21 DIAGNOSIS — G4734 Idiopathic sleep related nonobstructive alveolar hypoventilation: Secondary | ICD-10-CM | POA: Diagnosis not present

## 2020-07-21 DIAGNOSIS — M5441 Lumbago with sciatica, right side: Secondary | ICD-10-CM

## 2020-07-21 DIAGNOSIS — I288 Other diseases of pulmonary vessels: Secondary | ICD-10-CM | POA: Diagnosis not present

## 2020-07-21 DIAGNOSIS — I2510 Atherosclerotic heart disease of native coronary artery without angina pectoris: Secondary | ICD-10-CM | POA: Diagnosis not present

## 2020-07-21 DIAGNOSIS — R0609 Other forms of dyspnea: Secondary | ICD-10-CM

## 2020-07-21 NOTE — Assessment & Plan Note (Signed)
-   Patient states that her dyspnea on exertion is better controlled with using her rescue inhaler prior to exercise as well as continuing with her Breo Ellipta inhaler -Patient to follow-up with pulmonology -No further work-up at this time

## 2020-07-21 NOTE — Assessment & Plan Note (Addendum)
-   This problem is chronic and stable -Patient blood pressure was well controlled at her last visit in October (128/73) -We will continue with losartan, amlodipine and carvedilol for now -Patient to follow-up for an in person appointment so we can evaluate her blood pressure

## 2020-07-21 NOTE — Assessment & Plan Note (Signed)
-   This problem is chronic and stable -Patient remains on levothyroxine 12.5 mcg daily.  We went over how to take this medication and she states that she has been taking it appropriately -We will need repeat TSH at her next visit

## 2020-07-21 NOTE — Assessment & Plan Note (Signed)
-   Patient was noted to have coronary calcifications and 3 vessels noted on a CT of her chest last year -She also has some dyspnea on exertion which is thought to be secondary to her restrictive lung disease but may be a component of angina as well -We will refer patient to cardiology for further evaluation including possible stress test -Patient expressed understanding and is agreement with plan

## 2020-07-21 NOTE — Assessment & Plan Note (Signed)
-   Patient noted to have nocturnal hypoxia on recent sleep study -She was seen by pulmonology and started on O2 for use at night -Patient states that she is compliant with this and it has helped with her sleep -Patient also likely has sleep apnea and had a sleep study done recently but did not have enough time.  Patient will follow-up with Dr. Halford Chessman in June for this and will likely need repeat sleep study.  If she does have sleep apnea she would benefit from a CPAP machine

## 2020-07-21 NOTE — Assessment & Plan Note (Signed)
-   Patient noted to have persistently enlarged pulmonary artery on CT chest done last year -Patient did have a 2D echo done in the past which showed no evidence of pulmonary hypertension.  However, given the persistently enlarged pulmonary artery noted on CT we will refer patient to cardiology for further evaluation -No further work-up at this time

## 2020-07-21 NOTE — Progress Notes (Signed)
   Subjective:    Patient ID: [name], female    DOB: [birth year], 85 y.o.   MRN: [id number]  This is a telephone encounter between [name] and Aldine Contes on 07/21/2020 for possible referral for PCS. The visit was conducted with the patient located at home and Aldine Contes at [facility]. The patient's identity was confirmed using their DOB and current address. The patient has consented to being evaluated through a telephone encounter and understands the associated risks (an examination cannot be done and the patient may need to come in for an appointment) / benefits (allows the patient to remain at home, decreasing exposure to coronavirus). I personally spent 17 minutes on medical discussion.   HPI  I called patient for her telehealth appointment.  Patient states that she been having persistent back pain and has been unable to do her ADLs secondary to the pain.  Patient states that she has difficulty bathing herself and is unable to cook meals secondary to the pain.  Patient states that she is compliant with all her medications.  Review of Systems  Constitutional: Negative.   HENT: Negative.   Respiratory: Positive for shortness of breath.        Patient has dyspnea on exertion which is unchanged  Cardiovascular: Negative.   Gastrointestinal: Negative.   Musculoskeletal: Positive for back pain.  Neurological: Negative.   Psychiatric/Behavioral: Negative.        Objective:   Physical Exam  Unable to do physical exam as this was a telehealth appointment      Assessment & Plan:  Please see problem based charting for assessment and plan:

## 2020-07-21 NOTE — Assessment & Plan Note (Signed)
-   Patient continues to have persistent lower back pain -States pain is better controlled with Tylenol 3 but she still has difficulty with daily activities including breathing herself as well as cooking.  Patient would benefit from personal care services but I am unsure if patient has Medicaid.  Patient states that she does have Medicaid but it is not in her system. -If patient does not have Medicaid she may need to apply for this before we can obtain personal care services for her -We will refill Voltaren gel and Tylenol 3

## 2020-07-23 ENCOUNTER — Telehealth: Payer: Self-pay | Admitting: *Deleted

## 2020-07-23 ENCOUNTER — Other Ambulatory Visit: Payer: Self-pay

## 2020-07-23 DIAGNOSIS — M5441 Lumbago with sciatica, right side: Secondary | ICD-10-CM

## 2020-07-23 DIAGNOSIS — J984 Other disorders of lung: Secondary | ICD-10-CM

## 2020-07-23 MED ORDER — ALBUTEROL SULFATE HFA 108 (90 BASE) MCG/ACT IN AERS: 2.0000 | INHALATION_SPRAY | Freq: Four times a day (QID) | RESPIRATORY_TRACT | 0 refills | Status: DC | PRN

## 2020-07-23 MED ORDER — ACETAMINOPHEN-CODEINE #3 300-30 MG PO TABS: 1.0000 | ORAL_TABLET | ORAL | 0 refills | Status: DC | PRN

## 2020-07-23 NOTE — Telephone Encounter (Signed)
RTC, patient states tylenol #3 and albuterol were not at pharmacy.  Will send refill request to PCP.  RN also made return appt for patient for 08/20/20 w/ Dr. Gilford Rile per [place] note from Dr. Dareen Piano. Thanks, [employer]

## 2020-07-23 NOTE — Telephone Encounter (Signed)
Pt calling medicine that was suppose to be called in during the telehealth call pls contact [phone]

## 2020-07-23 NOTE — Telephone Encounter (Signed)
Received request from PCP for [facility] services for this patient. Unfortunately, she has [employer] only. Spoke with patient and asked her to contact her Case Worker to look into this. Patient will call us back if she is able to obtain full MCD. Will then proceed with PCS.

## 2020-08-09 DIAGNOSIS — J8410 Pulmonary fibrosis, unspecified: Secondary | ICD-10-CM | POA: Diagnosis not present

## 2020-08-13 ENCOUNTER — Other Ambulatory Visit: Payer: Self-pay | Admitting: Internal Medicine

## 2020-08-20 ENCOUNTER — Ambulatory Visit (INDEPENDENT_AMBULATORY_CARE_PROVIDER_SITE_OTHER): Payer: Medicare HMO | Admitting: Internal Medicine

## 2020-08-20 VITALS — BP 142/83 | HR 78 | Temp 98.3°F | Wt 244.5 lb

## 2020-08-20 DIAGNOSIS — M5441 Lumbago with sciatica, right side: Secondary | ICD-10-CM

## 2020-08-20 DIAGNOSIS — I10 Essential (primary) hypertension: Secondary | ICD-10-CM

## 2020-08-20 DIAGNOSIS — E039 Hypothyroidism, unspecified: Secondary | ICD-10-CM

## 2020-08-20 MED ORDER — ACETAMINOPHEN-CODEINE #3 300-30 MG PO TABS: 1.0000 | ORAL_TABLET | ORAL | 0 refills | Status: DC | PRN

## 2020-08-20 MED ORDER — AMLODIPINE BESYLATE 10 MG PO TABS: 10.0000 mg | ORAL_TABLET | Freq: Every day | ORAL | 11 refills | Status: DC

## 2020-08-20 NOTE — Patient Instructions (Signed)
[name],   It was a pleasure meeting you today! Today we talked about your blood pressure, thyroid,  tylenol, and paperwork.   For your blood pressure we will increase your amlodipine from 5 mg to 10 mg a day. If you feel dizziness or light headed, please decrease your dose back to 5 mg.   For your thyroid, please continue taking your medications. We will check your thyroid levels today.   We will refill your Tylenol #3 today, and will review your paperwork. We will see you in 2 weeks for a follow up on your blood pressure.  Have a good day,  Natalie Mercury, MD

## 2020-08-20 NOTE — Progress Notes (Signed)
   CC: Hypertension and Hypothyroidism  HPI:  [name] is a 85 y.o. person, with a PMH noted below, who presents to the clinic Hypertension and Hypothyroidism. To see the management of their acute and chronic conditions, please see the A&P note under the Encounters tab.   Past Medical History:  Diagnosis Date   Allergic rhinitis, cause unspecified    Asthma    Cancer ([place])    Chronic back pain    Coronary artery calcification seen on CAT scan 04/21/2017   Enlarged pulmonary artery ([place]) 04/21/2017   By chest CT   Frequent urination 11/14/2017   GERD (gastroesophageal reflux disease) 05/19/2011   history of Bilateral leg edema-likely amlodipine induced. 07/28/2011   history of CAP (community acquired pneumonia) 07/14/2011   history of HYPOTHYROIDISM, BORDERLINE 03/20/2006   Annotation: Asymptomatic, untreated Qualifier: Diagnosis of  By: Tomasa Hosteller MD, Edmon Crape.    Hx of breast cancer    Hyperlipidemia    Hypertension    Hypothyroidism    Lumbago    Murmur, cardiac 11/22/2016   OBESITY NOS 03/20/2006   Qualifier: Diagnosis of  By: Tomasa Hosteller MD, Veronique D.    OSTEOARTHRITIS 12/13/2005   Annotation: bilateral knees;right knee injection 6/05 Qualifier: Diagnosis of  By: Tomasa Hosteller MD, Veronique D.    right shoulder pain, likely Impingement syndrome  09/09/2010   VENOUS INSUFFICIENCY, CHRONIC 03/20/2006   Qualifier: Diagnosis of  By: Tomasa Hosteller MD, Edmon Crape.    Review of Systems:   Review of Systems  Constitutional:  Negative for chills, malaise/fatigue and weight loss.  Cardiovascular:  Negative for chest pain, palpitations and orthopnea.  Gastrointestinal:  Negative for abdominal pain, constipation, diarrhea, nausea and vomiting.  Psychiatric/Behavioral:  Negative for depression.     Physical Exam:  Vitals:   08/20/20 1044  BP: (!) 166/74  Pulse: 84  Temp: 98.3 F (36.8 C)  TempSrc: Oral  SpO2: 93%  Weight: 244 lb 8 oz (110.9 kg)   Physical Exam Constitutional:       Appearance: She is obese.     Assessment & Plan:   See Encounters Tab for problem based charting.  Patient discussed with Dr. Dareen Piano

## 2020-08-21 LAB — BMP8+ANION GAP
Anion Gap: 14 mmol/L (ref 10.0–18.0)
BUN/Creatinine Ratio: 21 (ref 12–28)
BUN: 18 mg/dL (ref 8–27)
CO2: 25 mmol/L (ref 20–29)
Calcium: 9.7 mg/dL (ref 8.7–10.3)
Chloride: 100 mmol/L (ref 96–106)
Creatinine, Ser: 0.84 mg/dL (ref 0.57–1.00)
Glucose: 108 mg/dL — ABNORMAL HIGH (ref 65–99)
Potassium: 3.8 mmol/L (ref 3.5–5.2)
Sodium: 139 mmol/L (ref 134–144)
eGFR: 69 mL/min/{1.73_m2} (ref >59)

## 2020-08-21 LAB — TSH: TSH: 4.35 u[IU]/mL (ref 0.450–4.500)

## 2020-08-21 NOTE — Assessment & Plan Note (Signed)
Patient presents with a BP of 166/74 pulse of 84.  On losartan, amlodipine, and carvedilol. Will increase amlodipine to 10 mg today. Additionally assess kidney function today.  - Continue losartan and carvedilol - Increase amlodipine to 10 mg daily - BMP - FU in 2 weeks for BP check

## 2020-08-21 NOTE — Progress Notes (Signed)
Internal Medicine Clinic Attending ? ?Case discussed with Dr. Winters  At the time of the visit.  We reviewed the resident?s history and exam and pertinent patient test results.  I agree with the assessment, diagnosis, and plan of care documented in the resident?s note.  ?

## 2020-08-21 NOTE — Assessment & Plan Note (Signed)
Patient taking levothyroxine 12.5 mcg daily. She takes her medication when she wakes up in the morning at 0600 an hour before eating and 30 minutes before taking her other medications.  - Recheck TSH today

## 2020-09-01 ENCOUNTER — Encounter: Payer: Self-pay | Admitting: Pulmonary Disease

## 2020-09-01 ENCOUNTER — Other Ambulatory Visit: Payer: Self-pay

## 2020-09-01 ENCOUNTER — Ambulatory Visit: Payer: Medicare HMO | Admitting: Pulmonary Disease

## 2020-09-01 VITALS — BP 128/72 | HR 75 | Temp 98.1°F | Ht 61.0 in | Wt 250.0 lb

## 2020-09-01 DIAGNOSIS — G4733 Obstructive sleep apnea (adult) (pediatric): Secondary | ICD-10-CM

## 2020-09-01 NOTE — Patient Instructions (Signed)
Will arrange for CPAP set up  Will arrange for overnight oxygen test with CPAP   Follow up in 4 months

## 2020-09-01 NOTE — Progress Notes (Signed)
[facility]  Chief Complaint  Patient presents with   Consult    Had sleep study back in April.  She is using the CPAP at night and DME is [employer].  She is unsure of her settings.     Constitutional:  BP 128/72   Pulse 75   Temp 98.1 F (36.7 C) (Oral)   Ht 5\' 1"  (1.549 m)   Wt 250 lb (113.4 kg)   SpO2 93%   BMI 47.24 kg/m   Past Medical History:  Back pain, CAD, GERD, Pneumonia, Hypothyroidism, Breast Cancer, HLD, HTN, OA  Past Surgical History:  She  has a past surgical history that includes Total hip arthroplasty; Breast lumpectomy; Abdominal hysterectomy; Hernia repair; Incisional hernia repair (N/A, 10/29/2013); Hip Closed Reduction (Right, 11/26/2014); Colonoscopy; and Joint replacement.  Brief Summary:  [name] is a 85 y.o. 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      Subjective:   She is followed by Dr. Chase Caller.  Saw Wyn Quaker.  Had sleep study done.  She has been experiencing snoring, apnea, sleep disruption, and daytime sleepiness.  Sleep study showed mild sleep apnea.  Her brother and her son both use CPAP.  Has nocturnal oxygen set up through [facility], but hasn't been using on consistent basis.  Physical Exam:   Appearance - well kempt   ENMT - no sinus tenderness, no oral exudate, no LAN, Mallampati 3 airway, no stridor, wears dentures  Respiratory - equal breath sounds bilaterally, no wheezing or rales  CV - s1s2 regular rate and rhythm, no murmurs  Ext - no clubbing, no edema  Skin - no rashes  Psych - normal mood and affect   Pulmonary testing:  IgE 01/01/20 >> 481 PFT 03/10/20 >> FEV1 0.78 (58%), FEV1% 79, TLC 40%  Chest Imaging:  SNIFF test 02/10/17 >> Lt hemidiaphragm elevation/eventration w/o paralysis HRCT chest 01/02/20 >> elevated Lt diaphragm, mild irregular peripheral interstitial opacity and septal thickening about  the Rt lung base suspicious for mild fibrotic interstitial lung disease  Sleep Tests:  PSG 06/15/20 >> AHI 8.6, SpO2 low 83%  Cardiac Tests:  Echo 01/17/20 >> EF 55 to 60%, grade 1 DD  Social History:  She  reports that she quit smoking about 62 years ago. Her smoking use included cigarettes. She has a 0.13 pack-year smoking history. She has never used smokeless tobacco. She reports that she does not drink alcohol and does not use drugs.  Family History:  Her family history includes Alzheimer's disease in her mother; Diabetes in her sister; Hypertension in her mother.     Assessment/Plan:   Obstructive sleep apnea. - test results reviewed - treatment options discussed - will arrange for auto CPAP set up; she has used [employer] in the past - will arrange for overnight oximetry with CPAP and then determine if she can have home oxygen set up discontinued  Asthma. - continue breo and prn albuterol - discussed different roles for her inhalers   Restrictive lung disease. - from obesity, mild ILD, and Lt diaphragm elevation - discussed how weight can impact sleep and risk for sleep disordered breathing - discussed options to assist with weight loss: combination of diet modification, cardiovascular and strength training exercises  Cardiovascular risk. - had an extensive discussion regarding the adverse health consequences related to untreated sleep disordered breathing - specifically discussed the risks for hypertension, coronary artery disease, cardiac dysrhythmias,  cerebrovascular disease, and diabetes - lifestyle modification discussed  Safe driving practices. - discussed how sleep disruption can increase risk of accidents, particularly when driving - safe driving practices were discussed  Time Spent Involved in Patient Care on Day of Examination:  35 minutes  Follow up:   Patient Instructions  Will arrange for CPAP set up  Will arrange for overnight oxygen test with CPAP    Follow up in 4 months  Medication List:   Allergies as of 09/01/2020       Reactions   Acyclovir And Related    Metoprolol Itching   Food Rash, Other (See Comments)   Pt states that she is allergic to pickles.          Medication List        Accurate as of September 01, 2020 11:02 AM. If you have any questions, ask your nurse or doctor.          acetaminophen-codeine 300-30 MG tablet Commonly known as: TYLENOL #3 Take 1 tablet by mouth every 4 (four) hours as needed for moderate pain.   albuterol 108 (90 Base) MCG/ACT inhaler Commonly known as: VENTOLIN HFA Inhale 2 puffs into the lungs every 6 (six) hours as needed for wheezing or shortness of breath.   amLODipine 10 MG tablet Commonly known as: NORVASC Take 1 tablet (10 mg total) by mouth daily.   aspirin EC 81 MG tablet Take 81 mg by mouth daily.   atorvastatin 40 MG tablet Commonly known as: LIPITOR Take 1 tablet (40 mg total) by mouth daily.   Breo Ellipta 200-25 MCG/INH Aepb Generic drug: fluticasone furoate-vilanterol INHALE 1 PUFF INTO THE LUNGS DAILY.   calcium citrate-vitamin D 315-200 MG-UNIT tablet Commonly known as: CITRACAL+D Take 1 tablet by mouth 2 (two) times daily.   carvedilol 25 MG tablet Commonly known as: COREG TAKE 1 TABLET BY MOUTH TWICE DAILY WITH A MEAL   diclofenac Sodium 1 % Gel Commonly known as: VOLTAREN APPLY 2 GRAMS TOPICALLY 4 TIMES DAILY   ferrous sulfate 325 (65 FE) MG tablet Take 1 tablet (325 mg total) by mouth daily.   levothyroxine 25 MCG tablet Commonly known as: SYNTHROID Take 0.5 tablets (12.5 mcg total) by mouth daily before breakfast.   losartan 50 MG tablet Commonly known as: COZAAR Take 2 tablets (100 mg total) by mouth daily.   pantoprazole 40 MG tablet Commonly known as: PROTONIX Take 1 tablet (40 mg total) by mouth daily.   VITAMIN B12 PO Take 1 tablet daily by mouth.        Signature:  Chesley Mires, MD [place] Pager - [phone] 09/01/2020, 11:02 AM

## 2020-09-03 ENCOUNTER — Ambulatory Visit (INDEPENDENT_AMBULATORY_CARE_PROVIDER_SITE_OTHER): Payer: Medicare HMO | Admitting: Student

## 2020-09-03 DIAGNOSIS — Z0000 Encounter for general adult medical examination without abnormal findings: Secondary | ICD-10-CM | POA: Diagnosis not present

## 2020-09-03 DIAGNOSIS — I10 Essential (primary) hypertension: Secondary | ICD-10-CM | POA: Diagnosis not present

## 2020-09-03 MED ORDER — ZOSTER VAC RECOMB ADJUVANTED 50 MCG/0.5ML IM SUSR: 0.5000 mL | Freq: Once | INTRAMUSCULAR | 0 refills | Status: AC

## 2020-09-03 NOTE — Patient Instructions (Signed)
It was a pleasure seeing you in clinic. Today we discussed:   Blood pressure: You blood pressure is looking good today. Please continue you current medications.  Follow up in 6 months for a check up.  If you have any questions or concerns, please call our clinic at [phone] between 9am-5pm and after hours call [phone] and ask for the internal medicine resident on call. If you feel you are having a medical emergency please call 911.   Thank you, we look forward to helping you remain healthy!

## 2020-09-04 NOTE — Assessment & Plan Note (Signed)
Shingles vaccine script given, patient will check if she can find an affordable pharmacy

## 2020-09-04 NOTE — Assessment & Plan Note (Signed)
BP today improved to 133/72 today on current regime. Denies any side effects of increasing amlodipine. Recently sleep study with pulm with demonstrating OSA, and waiting for machine to arrive. Anticipate BP may further improve with CPAP.  - Continue current medications - Follow up in 6 months

## 2020-09-04 NOTE — Progress Notes (Signed)
   CC: hypertension follow up  HPI:  [name] is a 85 y.o. female with a past medical history included below presents to clinic for follow up of hypertension. Please refer to problem based charting for further details and assessment and plan of current problem and chronic medical conditions.   Past Medical History:  Diagnosis Date   Allergic rhinitis, cause unspecified    Asthma    Cancer ([name])    Chronic back pain    Coronary artery calcification seen on CAT scan 04/21/2017   Enlarged pulmonary artery ([place]) 04/21/2017   By chest CT   Frequent urination 11/14/2017   GERD (gastroesophageal reflux disease) 05/19/2011   history of Bilateral leg edema-likely amlodipine induced. 07/28/2011   history of CAP (community acquired pneumonia) 07/14/2011   history of HYPOTHYROIDISM, BORDERLINE 03/20/2006   Annotation: Asymptomatic, untreated Qualifier: Diagnosis of  By: Tomasa Hosteller MD, Edmon Crape.    Hx of breast cancer    Hyperlipidemia    Hypertension    Hypothyroidism    Lumbago    Murmur, cardiac 11/22/2016   OBESITY NOS 03/20/2006   Qualifier: Diagnosis of  By: Tomasa Hosteller MD, Veronique D.    OSTEOARTHRITIS 12/13/2005   Annotation: bilateral knees;right knee injection 6/05 Qualifier: Diagnosis of  By: Tomasa Hosteller MD, Veronique D.    right shoulder pain, likely Impingement syndrome  09/09/2010   VENOUS INSUFFICIENCY, CHRONIC 03/20/2006   Qualifier: Diagnosis of  By: Tomasa Hosteller MD, Edmon Crape.    Review of Systems:  negative as per HPI  Physical Exam:  Vitals:   09/03/20 1014 09/03/20 1027 09/03/20 1028  BP: 137/78 133/72 133/72  Pulse: 72 71 71  Temp: 98.4 F (36.9 C)    TempSrc: Oral    SpO2: 100%    Weight: 249 lb 14.4 oz (113.4 kg)    Constitutional: Appears well-developed and well-nourished. No distress.  HENT: Normocephalic and atraumatic, EOMI, conjunctiva normal, moist mucous membranes Cardiovascular: Normal rate, regular rhythm,  Distal pulses intact Respiratory: No respiratory  distress, no accessory muscle use.  Effort is normal.  Lungs are clear to auscultation bilaterally. GI: Nondistended, soft, nontender to palpation, normal active bowel sounds Musculoskeletal: Normal bulk and tone.  Mild LE edema bilaterally. Neurological: Is alert and oriented x4, no apparent focal deficits  Skin: Warm and dry.  No rash, erythema, lesions noted. Psychiatric: Normal mood and affect.    Assessment & Plan:   See Encounters Tab for problem based charting.  Patient discussed with Dr. Philipp Ovens

## 2020-09-08 DIAGNOSIS — J8410 Pulmonary fibrosis, unspecified: Secondary | ICD-10-CM | POA: Diagnosis not present

## 2020-09-09 NOTE — Progress Notes (Deleted)
Cardiology Office Note   Date:  09/09/2020   ID:  [name], DOB 04/03/1935, MRN [id number]  PCP:  Aldine Contes, MD  Cardiologist:  Dr. Radford Pax    No chief complaint on file.     History of Present Illness: [name] is a 85 y.o. female who presents for ***  evaluation of coronary artery calcifications and enlarged pulmonary arteries noted on Chest CT at the request of Aldine Contes, MD.     This is a 85yo AAF with a history of asthma, GERD, HTN, hyperlipidemia and hypothyroidism who is referred after a chest CT showed coronary artery calcifications.  She has had chronic DOE for years which occurs only when walking.  She denies any chest pain or pressure, PND, orthopnea, LE edema, dizziness, palpitations or syncope. She smoked remotely in her 75's.  She has no family history of CAD.  She is compliant with her meds and is tolerating meds with no SE.    lexiscan done 05/2017 neg for ischemia EF 57%   HTN stable, LDL goal <70  enlarge pulmonary arteries with echo EF 55-60% G1DD,  no AS  carotid dopplers with 1-39% stenosis bil. Unable to eval PA.   CT angio 12/2019 Satisfactory opacification of the pulmonary arteries to the segmental level. No evidence of pulmonary embolism. Normal heart size. Three-vessel coronary artery calcifications. Enlargement of the main pulmonary artery, measuring up to 3.5 cm in caliber. No pericardial effusion. Aortic atherosclerosis  Past Medical History:  Diagnosis Date   Allergic rhinitis, cause unspecified    Asthma    Cancer ([place])    Chronic back pain    Coronary artery calcification seen on CAT scan 04/21/2017   Enlarged pulmonary artery ([place]) 04/21/2017   By chest CT   Frequent urination 11/14/2017   GERD (gastroesophageal reflux disease) 05/19/2011   history of Bilateral leg edema-likely amlodipine induced. 07/28/2011   history of CAP (community acquired pneumonia) 07/14/2011   history of HYPOTHYROIDISM, BORDERLINE 03/20/2006    Annotation: Asymptomatic, untreated Qualifier: Diagnosis of  By: Tomasa Hosteller MD, Edmon Crape.    Hx of breast cancer    Hyperlipidemia    Hypertension    Hypothyroidism    Lumbago    Murmur, cardiac 11/22/2016   OBESITY NOS 03/20/2006   Qualifier: Diagnosis of  By: Tomasa Hosteller MD, Veronique D.    OSTEOARTHRITIS 12/13/2005   Annotation: bilateral knees;right knee injection 6/05 Qualifier: Diagnosis of  By: Tomasa Hosteller MD, Veronique D.    right shoulder pain, likely Impingement syndrome  09/09/2010   VENOUS INSUFFICIENCY, CHRONIC 03/20/2006   Qualifier: Diagnosis of  By: Tomasa Hosteller MD, Edmon Crape.     Past Surgical History:  Procedure Laterality Date   ABDOMINAL HYSTERECTOMY     BREAST LUMPECTOMY     COLONOSCOPY     HERNIA REPAIR     HIP CLOSED REDUCTION Right 11/26/2014   Procedure: CLOSED REDUCTIION RIGHT HIP;  Surgeon: Newt Minion, MD;  Location: [facility];  Service: Orthopedics;  Laterality: Right;   INCISIONAL HERNIA REPAIR N/A 10/29/2013   Procedure: OPEN REPAIR OF RECURRENT INCISIONAL HERNIA WITH MESH;  Surgeon: Zenovia Jarred, MD;  Location: [facility];  Service: General;  Laterality: N/A;   JOINT REPLACEMENT     TOTAL HIP ARTHROPLASTY       Current Outpatient Medications  Medication Sig Dispense Refill   acetaminophen-codeine (TYLENOL #3) 300-30 MG tablet Take 1 tablet by mouth every 4 (four) hours as needed for moderate pain. 30 tablet 0  albuterol (VENTOLIN HFA) 108 (90 Base) MCG/ACT inhaler Inhale 2 puffs into the lungs every 6 (six) hours as needed for wheezing or shortness of breath. 18 g 0   amLODipine (NORVASC) 10 MG tablet Take 1 tablet (10 mg total) by mouth daily. 30 tablet 11   aspirin EC 81 MG tablet Take 81 mg by mouth daily.     atorvastatin (LIPITOR) 40 MG tablet Take 1 tablet (40 mg total) by mouth daily. 90 tablet 3   BREO ELLIPTA 200-25 MCG/INH AEPB INHALE 1 PUFF INTO THE LUNGS DAILY. 180 each 1   calcium citrate-vitamin D (CITRACAL+D) 315-200 MG-UNIT tablet Take 1 tablet by  mouth 2 (two) times daily. 120 tablet 3   carvedilol (COREG) 25 MG tablet TAKE 1 TABLET BY MOUTH TWICE DAILY WITH A MEAL 180 tablet 3   Cyanocobalamin (VITAMIN B12 PO) Take 1 tablet daily by mouth.     diclofenac Sodium (VOLTAREN) 1 % GEL APPLY 2 GRAMS TOPICALLY 4 TIMES DAILY 100 g 0   ferrous sulfate 325 (65 FE) MG tablet Take 1 tablet (325 mg total) by mouth daily. 90 tablet 1   levothyroxine (SYNTHROID) 25 MCG tablet Take 0.5 tablets (12.5 mcg total) by mouth daily before breakfast. 45 tablet 3   losartan (COZAAR) 50 MG tablet Take 2 tablets (100 mg total) by mouth daily. 180 tablet 0   pantoprazole (PROTONIX) 40 MG tablet Take 1 tablet (40 mg total) by mouth daily. 90 tablet 3   No current facility-administered medications for this visit.    Allergies:   Acyclovir and related, Metoprolol, and Food    Social History:  The patient  reports that she quit smoking about 62 years ago. Her smoking use included cigarettes. She has a 0.13 pack-year smoking history. She has never used smokeless tobacco. She reports that she does not drink alcohol and does not use drugs.   Family History:  The patient's ***family history includes Alzheimer's disease in her mother; Diabetes in her sister; Hypertension in her mother.    ROS:  General:no colds or fevers, no weight changes Skin:no rashes or ulcers HEENT:no blurred vision, no congestion CV:see HPI PUL:see HPI GI:no diarrhea constipation or melena, no indigestion GU:no hematuria, no dysuria MS:no joint pain, no claudication Neuro:no syncope, no lightheadedness Endo:no diabetes, no thyroid disease Wt Readings from Last 3 Encounters:  09/03/20 249 lb 14.4 oz (113.4 kg)  09/01/20 250 lb (113.4 kg)  08/20/20 244 lb 8 oz (110.9 kg)     PHYSICAL EXAM: VS:  There were no vitals taken for this visit. , BMI There is no height or weight on file to calculate BMI. General:Pleasant affect, NAD Skin:Warm and dry, brisk capillary  refill HEENT:normocephalic, sclera clear, mucus membranes moist Neck:supple, no JVD, no bruits  Heart:S1S2 RRR without murmur, gallup, rub or click Lungs:clear without rales, rhonchi, or wheezes XKG:YJEH, non tender, + BS, do not palpate liver spleen or masses Ext:no lower ext edema, 2+ pedal pulses, 2+ radial pulses Neuro:alert and oriented, MAE, follows commands, + facial symmetry    EKG:  EKG is ordered today. The ekg ordered today demonstrates ***   Recent Labs: 01/01/2020: Hemoglobin 12.0; Platelets 185.0 08/20/2020: BUN 18; Creatinine, Ser 0.84; Potassium 3.8; Sodium 139; TSH 4.350    Lipid Panel    Component Value Date/Time   CHOL 105 06/25/2019 1020   TRIG 55 06/25/2019 1020   HDL 47 06/25/2019 1020   CHOLHDL 2.2 06/25/2019 1020   CHOLHDL 2.5 09/04/2014 1037   VLDL  12 09/04/2014 1037   LDLCALC 45 06/25/2019 1020       Other studies Reviewed: Additional studies/ records that were reviewed today include: ***. Study Conclusions   - Left ventricle: Not well visualized. Grossly LV appears normal in    size with normal systolicfunction, LVEF 81-77% Doppler parameters    are consistent with abnormal left ventricular relaxation (grade 1    diastolic dysfunction). Doppler parameters are consistent with    high ventricular filling pressure.  - Technically difficult study. Very limited visualization even with    the use of echocontrast, limited diagnostic ability.  Aortic valve:  Poorly visualized. Uncertain number of leaflets.  Grossly no significant regurgitation or stenosis.  Doppler:     VTI  ratio of LVOT to aortic valve: 0.57. Peak velocity ratio of LVOT to  aortic valve: 0.58. Mean velocity ratio of LVOT to aortic valve:  0.6.    Mean gradient (S): 12 mm Hg. Peak gradient (S): 21 mm Hg.     -------------------------------------------------------------------  Aorta:  Poorly visualized. Pulmonary artery:    Systolic pressure could not be accurately  estimated.    Inadequate TR jet.  ASSESSMENT AND PLAN:  1.  ***   Current medicines are reviewed with the patient today.  The patient Has no concerns regarding medicines.  The following changes have been made:  See above Labs/ tests ordered today include:see above  Disposition:   FU:  see above  Signed, Cecilie Kicks, NP  09/09/2020 9:25 PM    [facility] [place], [address] [place], [place] Phone: [phone]; Fax: [phone]

## 2020-09-10 ENCOUNTER — Ambulatory Visit: Payer: Medicare HMO | Admitting: Cardiology

## 2020-09-13 NOTE — Patient Instructions (Signed)
xxxx 

## 2020-09-18 ENCOUNTER — Telehealth: Payer: Self-pay | Admitting: *Deleted

## 2020-09-18 ENCOUNTER — Ambulatory Visit (INDEPENDENT_AMBULATORY_CARE_PROVIDER_SITE_OTHER): Payer: Medicare HMO | Admitting: Student

## 2020-09-18 DIAGNOSIS — K0889 Other specified disorders of teeth and supporting structures: Secondary | ICD-10-CM | POA: Insufficient documentation

## 2020-09-18 HISTORY — DX: Other specified disorders of teeth and supporting structures: K08.89

## 2020-09-18 MED ORDER — AMOXICILLIN-POT CLAVULANATE 875-125 MG PO TABS
1.0000 | ORAL_TABLET | Freq: Two times a day (BID) | ORAL | 0 refills | Status: AC
Start: 2020-09-18 — End: 2020-09-25

## 2020-09-18 NOTE — Telephone Encounter (Signed)
Patient called in requesting med to stop swelling of lower jaw. States it is 2/2 teeth. Cannot afford to see dentist. Placed on today's schedule for tele appt.

## 2020-09-18 NOTE — Progress Notes (Signed)
   CC: Left-sided dental pain/cheek swelling  This is a telephone encounter between [name] and Sanjuana Letters on 09/18/2020 for left-sided dental pain and cheek swelling. The visit was conducted with the patient located at home and Sanjuana Letters at [facility]. The patient's identity was confirmed using their DOB and current address. The patient has consented to being evaluated through a telephone encounter and understands the associated risks (an examination cannot be done and the patient may need to come in for an appointment) / benefits (allows the patient to remain at home, decreasing exposure to coronavirus). I personally spent 15 minutes on medical discussion.   HPI:  [name] is a 85 y.o. with PMH as below.   Please see A&P for assessment of the patient's acute and chronic medical conditions.   Past Medical History:  Diagnosis Date   Allergic rhinitis, cause unspecified    Asthma    Cancer ([place])    Chronic back pain    Coronary artery calcification seen on CAT scan 04/21/2017   Enlarged pulmonary artery ([place]) 04/21/2017   By chest CT   Frequent urination 11/14/2017   GERD (gastroesophageal reflux disease) 05/19/2011   history of Bilateral leg edema-likely amlodipine induced. 07/28/2011   history of CAP (community acquired pneumonia) 07/14/2011   history of HYPOTHYROIDISM, BORDERLINE 03/20/2006   Annotation: Asymptomatic, untreated Qualifier: Diagnosis of  By: Tomasa Hosteller MD, Edmon Crape.    Hx of breast cancer    Hyperlipidemia    Hypertension    Hypothyroidism    Lumbago    Murmur, cardiac 11/22/2016   OBESITY NOS 03/20/2006   Qualifier: Diagnosis of  By: Tomasa Hosteller MD, Veronique D.    OSTEOARTHRITIS 12/13/2005   Annotation: bilateral knees;right knee injection 6/05 Qualifier: Diagnosis of  By: Tomasa Hosteller MD, Veronique D.    right shoulder pain, likely Impingement syndrome  09/09/2010   VENOUS INSUFFICIENCY, CHRONIC 03/20/2006   Qualifier: Diagnosis of  By: Tomasa Hosteller MD, Edmon Crape.    Review of Systems: Left-sided dental pain left-sided facial swelling.  Denies fever chills.  Denies difficulty swallowing.  Denies difficulty breathing.    Assessment & Plan:   See Encounters Tab for problem based charting.  Patient discussed with Dr. Fanny Bien Internal Medicine Resident

## 2020-09-18 NOTE — Assessment & Plan Note (Signed)
Assessment: Patient states for the last month she has been dealing with left-sided dental pain.  She was seen by her dentist who recommended dental extraction, she was referred to dental surgery.  She was seen by dental surgery on 7/11 who took x-rays and recommended extraction.  Patient states however her dental insurance is not available for another few months so she will have to wait until then.  For the past 3 days she has dealt with severe left-sided dental pain and left cheek.  She took 2 days of leftover penicillin she had, she states she took 2 pills yesterday and 2 today.  She states that since then the pain has resolved.  She denies difficulty swallowing or managing her secretions.  She denies difficulty breathing.  She called her dentist yesterday and they discussed with her that they would not extract the tooth urgently until the swelling had resolved.  Discussed that we will write for a course of Augmentin and recommend that she call her dentist on Monday to schedule follow-up appointment.  Plan: -Augmentin 875-125 twice daily for 7 days -Follow-up with dentist -Given precautions if difficulty swallowing, cannot handle secretions, difficulty breathing to go to closest emergency department

## 2020-09-28 NOTE — Progress Notes (Signed)
Internal Medicine Clinic Attending  Case discussed with Dr. Katsadouros  At the time of the visit.  We reviewed the resident's history and pertinent patient test results.  I agree with the assessment, diagnosis, and plan of care documented in the resident's note.  

## 2020-10-09 DIAGNOSIS — J8410 Pulmonary fibrosis, unspecified: Secondary | ICD-10-CM | POA: Diagnosis not present

## 2020-10-15 ENCOUNTER — Other Ambulatory Visit: Payer: Self-pay

## 2020-10-15 DIAGNOSIS — M5441 Lumbago with sciatica, right side: Secondary | ICD-10-CM

## 2020-10-15 NOTE — Telephone Encounter (Signed)
acetaminophen-codeine (TYLENOL #3) 300-30 MG tablet, refill request '@[facility]'$  [facility] [place] [address]

## 2020-10-16 MED ORDER — ACETAMINOPHEN-CODEINE #3 300-30 MG PO TABS: 1.0000 | ORAL_TABLET | ORAL | 0 refills | Status: DC | PRN

## 2020-10-16 NOTE — Telephone Encounter (Signed)
Pt's calling again for Tylenol #3 refill.   Thanks

## 2020-10-19 ENCOUNTER — Telehealth: Payer: Self-pay | Admitting: Internal Medicine

## 2020-10-19 NOTE — Telephone Encounter (Signed)
Returned call to patient. States she has to call around for transportation. She will call back to schedule appt as soon as she knows when someone can bring her.

## 2020-10-19 NOTE — Telephone Encounter (Signed)
Pt requesting a call back.  Pt states she took 1 pill yesterday of the following medication she normally takes and it cause her to become dizzy and very itchy.  Please call the patient back.  acetaminophen-codeine (TYLENOL #3) 300-30 MG tablet

## 2020-10-19 NOTE — Telephone Encounter (Signed)
Can we have her follow up in [facility]? She has been on this medication for multiple years and it would be unlikely to cause a reaction now.

## 2020-10-19 NOTE — Telephone Encounter (Signed)
Returned call to patient. States she took T#3 at 0800 or 0830 yesterday. Began feeling dizzy at 0900 or 0930. At 1230 she began itching "all over," more on arms, and right wrist had swelling. She took benadryl last evening and again this AM with some relief. She denied tongue/lip swelling. SHOB is as usual for her. She is advised not to take anymore T#3 until PCP advises.

## 2020-10-21 ENCOUNTER — Ambulatory Visit (INDEPENDENT_AMBULATORY_CARE_PROVIDER_SITE_OTHER): Payer: Medicare HMO | Admitting: Student

## 2020-10-21 ENCOUNTER — Encounter: Payer: Self-pay | Admitting: Student

## 2020-10-21 ENCOUNTER — Other Ambulatory Visit: Payer: Self-pay

## 2020-10-21 VITALS — BP 126/70 | HR 81 | Temp 98.1°F | Ht 62.0 in | Wt 245.0 lb

## 2020-10-21 DIAGNOSIS — J984 Other disorders of lung: Secondary | ICD-10-CM

## 2020-10-21 DIAGNOSIS — L299 Pruritus, unspecified: Secondary | ICD-10-CM | POA: Diagnosis not present

## 2020-10-21 DIAGNOSIS — I10 Essential (primary) hypertension: Secondary | ICD-10-CM

## 2020-10-21 DIAGNOSIS — M5441 Lumbago with sciatica, right side: Secondary | ICD-10-CM | POA: Diagnosis not present

## 2020-10-21 MED ORDER — AMLODIPINE BESYLATE 10 MG PO TABS: 10.0000 mg | ORAL_TABLET | Freq: Every day | ORAL | 11 refills | Status: DC

## 2020-10-21 MED ORDER — ALBUTEROL SULFATE HFA 108 (90 BASE) MCG/ACT IN AERS: 2.0000 | INHALATION_SPRAY | Freq: Four times a day (QID) | RESPIRATORY_TRACT | 0 refills | Status: DC | PRN

## 2020-10-21 MED ORDER — ACETAMINOPHEN-CODEINE #3 300-30 MG PO TABS: 1.0000 | ORAL_TABLET | ORAL | 0 refills | Status: DC | PRN

## 2020-10-21 NOTE — Progress Notes (Signed)
   CC: Dizziness, pruritus  HPI:  [name] is a 85 y.o. with past medical history of hypertension, hyperlipidemia, chronic back pain, who presented to the clinic for acute onset of dizziness and pruritus after taking her acetaminophen-codeine.  Please see problem based charting for detail.  Past Medical History:  Diagnosis Date   Allergic rhinitis, cause unspecified    Asthma    Cancer ([place])    Chronic back pain    Coronary artery calcification seen on CAT scan 04/21/2017   Enlarged pulmonary artery ([place]) 04/21/2017   By chest CT   Frequent urination 11/14/2017   GERD (gastroesophageal reflux disease) 05/19/2011   history of Bilateral leg edema-likely amlodipine induced. 07/28/2011   history of CAP (community acquired pneumonia) 07/14/2011   history of HYPOTHYROIDISM, BORDERLINE 03/20/2006   Annotation: Asymptomatic, untreated Qualifier: Diagnosis of  By: Tomasa Hosteller MD, Edmon Crape.    Hx of breast cancer    Hyperlipidemia    Hypertension    Hypothyroidism    Lumbago    Murmur, cardiac 11/22/2016   OBESITY NOS 03/20/2006   Qualifier: Diagnosis of  By: Tomasa Hosteller MD, Veronique D.    OSTEOARTHRITIS 12/13/2005   Annotation: bilateral knees;right knee injection 6/05 Qualifier: Diagnosis of  By: Tomasa Hosteller MD, Veronique D.    right shoulder pain, likely Impingement syndrome  09/09/2010   VENOUS INSUFFICIENCY, CHRONIC 03/20/2006   Qualifier: Diagnosis of  By: Tomasa Hosteller MD, Edmon Crape.    Review of Systems:  per HPI  Physical Exam:  Vitals:   10/21/20 1052  BP: 126/70  Pulse: 81  Temp: 98.1 F (36.7 C)  TempSrc: Oral  SpO2: 95%  Weight: 245 lb (111.1 kg)  Height: '5\' 2"'$  (1.575 m)   Physical Exam Constitutional:      General: She is not in acute distress.    Appearance: She is not toxic-appearing.  HENT:     Head: Normocephalic.  Cardiovascular:     Rate and Rhythm: Normal rate and regular rhythm.  Pulmonary:     Effort: Pulmonary effort is normal. No respiratory distress.      Breath sounds: Normal breath sounds. No wheezing.  Musculoskeletal:        General: Normal range of motion.     Cervical back: Normal range of motion.  Neurological:     Mental Status: She is alert.     Comments: EOM intact Cranial nerves none deficit Normal strength and sensation of bilateral LE and UE  Psychiatric:        Mood and Affect: Mood normal.        Behavior: Behavior normal.     Assessment & Plan:   See Encounters Tab for problem based charting.  Patient discussed with Dr. Dareen Piano

## 2020-10-21 NOTE — Patient Instructions (Signed)
[name],  It was a pleasure seeing you in the clinic today.  Here is a summary what we talked about:  1.  Itchiness: I have sent a new prescription of your pain medication to [facility].  This medication is made from the old company so you hopefully can tolerate it fine.  Please let us know if you still aperients similar symptoms.  2.  Dizziness: This could be a side effect of codeine.  Your blood pressure drops when standing up which can also explain the dizziness.  Please drink plenty of fluid and avoid dehydration.  Avoid changing position rapidly.  Take care,  Dr. Alfonse Spruce

## 2020-10-21 NOTE — Assessment & Plan Note (Signed)
Patient experienced side effects including drowsiness and pruritus with codeine.  She just picked up the prescription on 8/12, which was manufactured from a different company, confirmed with [employer].  -Resent prescription of acetaminophen/codeine to [facility] on [address]. We also disposed acetaminophen/codeine that she brought to the clinic.

## 2020-10-21 NOTE — Assessment & Plan Note (Addendum)
Patient reports acute onset of dizziness that happened 2 days ago after she took a tablet of acetaminophen-codeine in the morning.  This is the first tablet from a new bottle that she picked up a few days ago.  Report dizziness with changing position.  She then later developed pruritus of bilateral forearms.  She denies any skin rash.  The pruritus improves with 1 tablet of Benadryl.  She later took another Benadryl at night and all his symptoms have resolved the next day.  She denies eating any new food.  She says she ate a banana and coffee for the morning.  Denies shortness of breath, drooling, tongue swelling, nausea, vomiting, constipation or diarrhea.  States that it never happened before.  Patient has been on acetaminophen-codeine for many years without issue.  She did call the pharmacy the next day and was told that her new prescription is made from a different company.  Assessment and plan This is not a allergic reaction picture.  Patient has no skin rash on physical exam.  Her pruritus and dizziness are likely side effects from codeine.  It is very insisting that she develops side effects with codeine after being on it for many years.  Perhaps she cannot tolerate the medication from a new company.  I will resend a prescription of acetaminophen/codeine to a different pharmacy.  They confirmed the availability of the old company's medication.  We also check orthostatic vital sign: Supine 158/73 HR 73 Sitting 158/73 HR 77 Standing 132/70 HR 79 Patient is mildly symptomatic when changing from sitting to standing.  Blood pressures positive for orthostatic hypotension.  Advised patient to hydrate appropriately and avoid dehydration.  Also advised her to avoid changing position rapidly.  -Resent prescription of acetaminophen/codeine to [facility] on [address]. We also disposed acetaminophen/codeine that she brought to the clinic. -Advised patient to drink plenty of fluid.

## 2020-10-21 NOTE — Assessment & Plan Note (Addendum)
Blood pressure within good range.  -Refill amlodipine

## 2020-10-22 NOTE — Progress Notes (Signed)
Internal Medicine Clinic Attending  Case discussed with Dr. Nguyen  At the time of the visit.  We reviewed the resident's history and exam and pertinent patient test results.  I agree with the assessment, diagnosis, and plan of care documented in the resident's note. 

## 2020-10-27 ENCOUNTER — Telehealth: Payer: Self-pay

## 2020-10-27 NOTE — Telephone Encounter (Signed)
Pt is requesting a call back she is wanting to know if her PCP filled and returned the paperwork for her PCS as of yet

## 2020-11-03 NOTE — Telephone Encounter (Signed)
[name], please see note from 5/19. Only [employer] is listed on patient's registration sheet. Can you please call patient and see if she now has full Medicaid? Otherwise, PCS would be out of pocket expense. Thank you.

## 2020-11-04 NOTE — Telephone Encounter (Signed)
Patient returned my call, states she spoke with her social worker to let her know about her medicaid in incorrect and that SW is to contact medicaid to correct  the mistake. [name] is to call our office when this is corrected.

## 2020-11-04 NOTE — Telephone Encounter (Signed)
Spoke with patient,she states that she has letter stating that she has the full medicaid. Will call patient back after lunch for her to find letter to get the MID# in the letter so [name] can check . Patient is requesting PCS.

## 2020-11-04 NOTE — Telephone Encounter (Signed)
Spoke with patient regarding her MID#, that number is showing for medicaid family planning and do not cover PCS.  Patient states she will call me back after she speaks with someone at the [facility] office. She is to call back at [phone].

## 2020-11-09 ENCOUNTER — Ambulatory Visit: Payer: Medicare HMO

## 2020-11-09 DIAGNOSIS — J8410 Pulmonary fibrosis, unspecified: Secondary | ICD-10-CM | POA: Diagnosis not present

## 2020-11-12 NOTE — Telephone Encounter (Signed)
Spoke with patient regarding her medicaid care (family planning), card still have not been corrected . Patient states she will call me back when card has been changed

## 2020-11-26 DIAGNOSIS — H25811 Combined forms of age-related cataract, right eye: Secondary | ICD-10-CM | POA: Diagnosis not present

## 2020-11-26 DIAGNOSIS — Z961 Presence of intraocular lens: Secondary | ICD-10-CM | POA: Diagnosis not present

## 2020-11-26 DIAGNOSIS — H31092 Other chorioretinal scars, left eye: Secondary | ICD-10-CM | POA: Diagnosis not present

## 2020-11-26 DIAGNOSIS — H348322 Tributary (branch) retinal vein occlusion, left eye, stable: Secondary | ICD-10-CM | POA: Diagnosis not present

## 2020-11-26 DIAGNOSIS — H401123 Primary open-angle glaucoma, left eye, severe stage: Secondary | ICD-10-CM | POA: Diagnosis not present

## 2020-12-03 ENCOUNTER — Telehealth: Payer: Self-pay | Admitting: Internal Medicine

## 2020-12-03 NOTE — Telephone Encounter (Signed)
Called and spoke with patient who is calling because she wants to see about getting oxygen to use with exertion. Patient states that she only wears oxygen at night but gets out of breath with exertion and has to rest. Advised patient that she would need an OV and be qualified for oxygen with POC before we can send an order for it. Patient is not scheduled for OV. Nothing further needed at this time.    Next Appt With Pulmonology Martyn Ehrich, NP)12/11/2020 at 10:00 AM

## 2020-12-03 NOTE — Telephone Encounter (Signed)
SPOKE WITH PATIENT REGARDING THE CORRECTION THAT WAS TO BE DONE TO HER MEDICAID. PATIENT STATES THAT HER MEDICAID WAS DENIED.

## 2020-12-09 DIAGNOSIS — J8410 Pulmonary fibrosis, unspecified: Secondary | ICD-10-CM | POA: Diagnosis not present

## 2020-12-11 ENCOUNTER — Ambulatory Visit: Payer: Medicare HMO | Admitting: Primary Care

## 2020-12-16 ENCOUNTER — Other Ambulatory Visit: Payer: Self-pay

## 2020-12-16 ENCOUNTER — Encounter: Payer: Self-pay | Admitting: Primary Care

## 2020-12-16 ENCOUNTER — Ambulatory Visit (INDEPENDENT_AMBULATORY_CARE_PROVIDER_SITE_OTHER): Payer: Medicare HMO | Admitting: Primary Care

## 2020-12-16 VITALS — BP 128/82 | HR 62 | Temp 98.1°F | Ht 62.0 in | Wt 241.0 lb

## 2020-12-16 DIAGNOSIS — G4733 Obstructive sleep apnea (adult) (pediatric): Secondary | ICD-10-CM | POA: Diagnosis not present

## 2020-12-16 DIAGNOSIS — J9611 Chronic respiratory failure with hypoxia: Secondary | ICD-10-CM | POA: Diagnosis not present

## 2020-12-16 DIAGNOSIS — J45909 Unspecified asthma, uncomplicated: Secondary | ICD-10-CM | POA: Insufficient documentation

## 2020-12-16 DIAGNOSIS — J4530 Mild persistent asthma, uncomplicated: Secondary | ICD-10-CM

## 2020-12-16 DIAGNOSIS — R6889 Other general symptoms and signs: Secondary | ICD-10-CM | POA: Diagnosis not present

## 2020-12-16 NOTE — Assessment & Plan Note (Addendum)
-   Stable; Continue BREO ellipta one puff daily in the morning

## 2020-12-16 NOTE — Progress Notes (Signed)
@Patient  ID: [name], female    DOB: [birth year], 85 y.o.   MRN: [id number]  Chief Complaint  Patient presents with   Follow-up    States she has noticed that she has been more SOB with exertion over the past 6 months. She has never used O2 during the day before. Currently uses 2L at night.     Referring provider: Aldine Contes, MD  HPI: 85 year old female, former light smoker quit in September 23, 1958. 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  Patient of Dr. Halford Chessman, last seen in office on 09/01/2020 for OSA. Also follows with Dr. Chase Caller for ILD.   Previous [facility] pulmonary encounter: 09/01/20- Dr. Berdie Ogren She is followed by Dr. Chase Caller.  Saw Wyn Quaker.  Had sleep study done.  She has been experiencing snoring, apnea, sleep disruption, and daytime sleepiness.  Sleep study showed mild sleep apnea.  Her brother and her son both use CPAP.  Has nocturnal oxygen set up through [facility], but hasn't been using on consistent basis  12/16/2020- Interim hx  Patient presents today for follow-up.  She is looking to get portable oxygen concentrator for daytime use. During her last visit in June with Dr. Halford Chessman she was arranged to start auto CPAP. Patient continues to experience dyspnea with exertion, this is not new. She has no new or acute respiratory symptoms. She qualified for daytime oxygen today. She is maintained on Breo 272mcg daily and prn albuterol hfa for asthma. She is wearing 2L oxygen at night. She has not yet been set up with CPAP, ONO is on hold until she can get set up on PAP therapy.    Allergies  Allergen Reactions   Acyclovir And Related    Metoprolol Itching   Food Rash and Other (See Comments)    Pt states that she is allergic to pickles.      Immunization History  Administered Date(s) Administered   Fluad  Quad(high Dose 65+) 12/27/2019   H1N1 03/26/2008   Influenza Split 11/25/2010, 01/19/2012   Influenza Whole 02/09/2006, 12/10/2007, 04/16/2009, 01/07/2010   Influenza,inj,Quad PF,6+ Mos 02/15/2013, 12/09/2014, 01/12/2016, 11/22/2016, 11/14/2017   PFIZER(Purple Top)SARS-COV-2 Vaccination 04/13/2019, 05/04/2019   Pneumococcal Conjugate-13 09/04/2014   Pneumococcal Polysaccharide-23 09/10/2010   Tdap 11/25/2010    Past Medical History:  Diagnosis Date   Allergic rhinitis, cause unspecified    Asthma    Cancer ([place])    Chronic back pain    Coronary artery calcification seen on CAT scan 04/21/2017   Enlarged pulmonary artery ([place]) 04/21/2017   By chest CT   Frequent urination 11/14/2017   GERD (gastroesophageal reflux disease) 05/19/2011   history of Bilateral leg edema-likely amlodipine induced. 07/28/2011   history of CAP (community acquired pneumonia) 07/14/2011   history of HYPOTHYROIDISM, BORDERLINE 03/20/2006   Annotation: Asymptomatic, untreated Qualifier: Diagnosis of  By: Tomasa Hosteller MD, Edmon Crape.    Hx of breast cancer    Hyperlipidemia    Hypertension    Hypothyroidism    Lumbago    Murmur, cardiac 11/22/2016   OBESITY NOS 03/20/2006   Qualifier: Diagnosis of  By: Tomasa Hosteller MD, Veronique D.    OSTEOARTHRITIS 12/13/2005   Annotation: bilateral knees;right knee injection 6/05 Qualifier: Diagnosis of  By: Tomasa Hosteller MD, Veronique D.    right shoulder pain, likely Impingement syndrome  09/09/2010   VENOUS INSUFFICIENCY, CHRONIC 03/20/2006   Qualifier: Diagnosis of  By: Tomasa Hosteller  MD, Edmon Crape.     Tobacco History: Social History   Tobacco Use  Smoking Status Former   Packs/day: 0.25   Years: 0.50   Pack years: 0.13   Types: Cigarettes   Quit date: 09/09/1958   Years since quitting: 62.3  Smokeless Tobacco Never  Tobacco Comments   social smoker   Counseling given: Not Answered Tobacco comments: social smoker   Outpatient Medications Prior to Visit  Medication Sig Dispense  Refill   acetaminophen-codeine (TYLENOL #3) 300-30 MG tablet Take 1 tablet by mouth every 4 (four) hours as needed for moderate pain. 30 tablet 0   albuterol (VENTOLIN HFA) 108 (90 Base) MCG/ACT inhaler Inhale 2 puffs into the lungs every 6 (six) hours as needed for wheezing or shortness of breath. 18 g 0   amLODipine (NORVASC) 10 MG tablet Take 1 tablet (10 mg total) by mouth daily. 30 tablet 11   aspirin EC 81 MG tablet Take 81 mg by mouth daily.     atorvastatin (LIPITOR) 40 MG tablet Take 1 tablet (40 mg total) by mouth daily. 90 tablet 3   BREO ELLIPTA 200-25 MCG/INH AEPB INHALE 1 PUFF INTO THE LUNGS DAILY. 180 each 1   calcium citrate-vitamin D (CITRACAL+D) 315-200 MG-UNIT tablet Take 1 tablet by mouth 2 (two) times daily. 120 tablet 3   carvedilol (COREG) 25 MG tablet TAKE 1 TABLET BY MOUTH TWICE DAILY WITH A MEAL 180 tablet 3   Cyanocobalamin (VITAMIN B12 PO) Take 1 tablet daily by mouth.     diclofenac Sodium (VOLTAREN) 1 % GEL APPLY 2 GRAMS TOPICALLY 4 TIMES DAILY 100 g 0   ferrous sulfate 325 (65 FE) MG tablet Take 1 tablet (325 mg total) by mouth daily. 90 tablet 1   levothyroxine (SYNTHROID) 25 MCG tablet Take 0.5 tablets (12.5 mcg total) by mouth daily before breakfast. 45 tablet 3   losartan (COZAAR) 50 MG tablet Take 2 tablets (100 mg total) by mouth daily. 180 tablet 0   pantoprazole (PROTONIX) 40 MG tablet Take 1 tablet (40 mg total) by mouth daily. 90 tablet 3   No facility-administered medications prior to visit.   Review of Systems  Review of Systems  Constitutional: Negative.   HENT: Negative.  Negative for congestion and postnasal drip.   Respiratory:  Positive for shortness of breath. Negative for cough, chest tightness and wheezing.   Cardiovascular:  Positive for leg swelling.    Physical Exam  BP 128/82   Pulse 62   Temp 98.1 F (36.7 C) (Oral)   Ht 5\' 2"  (1.575 m)   Wt 241 lb (109.3 kg)   SpO2 95% Comment: on RA  BMI 44.08 kg/m  Physical  Exam Constitutional:      Appearance: Normal appearance.  HENT:     Head: Normocephalic and atraumatic.     Mouth/Throat:     Mouth: Mucous membranes are moist.     Pharynx: Oropharynx is clear.  Cardiovascular:     Rate and Rhythm: Normal rate and regular rhythm.  Pulmonary:     Effort: Pulmonary effort is normal.     Breath sounds: Normal breath sounds. No wheezing, rhonchi or rales.     Comments: No overt wheezing, rhonchi or rales  Skin:    General: Skin is warm and dry.  Neurological:     General: No focal deficit present.     Mental Status: She is alert and oriented to person, place, and time. Mental status is at baseline.  Psychiatric:  Mood and Affect: Mood normal.        Behavior: Behavior normal.        Thought Content: Thought content normal.        Judgment: Judgment normal.     Lab Results:  CBC    Component Value Date/Time   WBC 7.1 01/01/2020 1144   RBC 4.28 01/01/2020 1144   HGB 12.0 01/01/2020 1144   HGB 11.7 06/25/2019 1020   HCT 37.0 01/01/2020 1144   HCT 36.9 06/25/2019 1020   PLT 185.0 01/01/2020 1144   PLT 204 06/25/2019 1020   MCV 86.3 01/01/2020 1144   MCV 84 06/25/2019 1020   MCH 26.7 06/25/2019 1020   MCH 26.9 12/01/2018 1655   MCHC 32.4 01/01/2020 1144   RDW 14.7 01/01/2020 1144   RDW 13.4 06/25/2019 1020   LYMPHSABS 1.3 01/01/2020 1144   LYMPHSABS 1.3 11/20/2018 1023   MONOABS 0.7 01/01/2020 1144   EOSABS 0.6 01/01/2020 1144   EOSABS 0.4 11/20/2018 1023   BASOSABS 0.0 01/01/2020 1144   BASOSABS 0.1 11/20/2018 1023    BMET    Component Value Date/Time   NA 139 08/20/2020 1125   K 3.8 08/20/2020 1125   CL 100 08/20/2020 1125   CO2 25 08/20/2020 1125   GLUCOSE 108 (H) 08/20/2020 1125   GLUCOSE 127 (H) 12/01/2018 1655   BUN 18 08/20/2020 1125   CREATININE 0.84 08/20/2020 1125   CREATININE 0.91 09/23/2014 1415   CALCIUM 9.7 08/20/2020 1125   GFRNONAA 57 (L) 12/27/2019 1028   GFRNONAA 61 09/23/2014 1415   GFRAA 66  12/27/2019 1028   GFRAA 71 09/23/2014 1415    BNP No results found for: BNP  ProBNP No results found for: PROBNP  Imaging: No results found.   Assessment & Plan:   Chronic respiratory failure with hypoxia (HCC) - Secondary to obesity, restrictive lung disease, elevated hemidiaphragm and mild ILD. No acute associated respiratory symptoms. She is already using 2L oxygen at night and qualified for daytime oxygen with exertion today. O2 desaturated to 86% RA on walk test, she was able to maintained O2 >90% with 2L pulsed oxygen using portable concentrator. Sending in DME order for POC.   OSA (obstructive sleep apnea) - PSG 06/15/20 showed mild OSA, AHI 8.6/hr with spO2 low 83%. Ordered to start auto CPAP back in June 2022, she is still awaiting CPAP machine. We will follow-up with her DME company to check on status.   Asthma - Stable; Continue BREO ellipta one puff daily in the morning  FU in 3 months with Dr. Almedia Balls, NP 12/16/2020

## 2020-12-16 NOTE — Progress Notes (Signed)
Reviewed and agree with assessment/plan.   Chesley Mires, MD [facility] Pulmonary/Critical Care 12/16/2020, 1:23 PM Pager:  [phone]

## 2020-12-16 NOTE — Assessment & Plan Note (Addendum)
-   Secondary to obesity, restrictive lung disease, elevated hemidiaphragm and mild ILD. No acute associated respiratory symptoms. She is already using 2L oxygen at night and qualified for daytime oxygen with exertion today. O2 desaturated to 86% RA on walk test, she was able to maintained O2 >90% with 2L pulsed oxygen using portable concentrator. Sending in DME order for POC.

## 2020-12-16 NOTE — Patient Instructions (Addendum)
Recommendations:  - Continue to wear 2L oxygen at night - Use portable concentrator 2L as needed with exertion to maintain O2 >90% - Continue BREO ellipta one puff daily in the morning - Work on weight loss efforts   Orders: - Please call [facility] and ask about CPAP that was ordered back in June  - POC 2L as needed with exertion   Follow-up: - 3 months with Dr. Halford Chessman

## 2020-12-16 NOTE — Assessment & Plan Note (Addendum)
-   PSG 06/15/20 showed mild OSA, AHI 8.6/hr with spO2 low 83%. Ordered to start auto CPAP back in June 2022, she is still awaiting CPAP machine. We will follow-up with her DME company to check on status.

## 2020-12-21 ENCOUNTER — Telehealth: Payer: Self-pay | Admitting: Internal Medicine

## 2020-12-21 DIAGNOSIS — R6889 Other general symptoms and signs: Secondary | ICD-10-CM | POA: Diagnosis not present

## 2020-12-21 DIAGNOSIS — J984 Other disorders of lung: Secondary | ICD-10-CM | POA: Diagnosis not present

## 2020-12-21 DIAGNOSIS — J9611 Chronic respiratory failure with hypoxia: Secondary | ICD-10-CM

## 2020-12-21 DIAGNOSIS — I10 Essential (primary) hypertension: Secondary | ICD-10-CM | POA: Diagnosis not present

## 2020-12-21 DIAGNOSIS — G4733 Obstructive sleep apnea (adult) (pediatric): Secondary | ICD-10-CM | POA: Diagnosis not present

## 2020-12-21 DIAGNOSIS — I288 Other diseases of pulmonary vessels: Secondary | ICD-10-CM | POA: Diagnosis not present

## 2020-12-21 NOTE — Telephone Encounter (Signed)
MR please advise.  The pt did not qualify for POC from [place].  She will have to start on oxygen tanks.  thanks

## 2020-12-23 NOTE — Telephone Encounter (Signed)
Ok strt o2 tanks

## 2020-12-24 DIAGNOSIS — J9601 Acute respiratory failure with hypoxia: Secondary | ICD-10-CM | POA: Diagnosis not present

## 2020-12-25 NOTE — Telephone Encounter (Signed)
Left message for patient to call back. She will need to be informed of this before placing order.

## 2021-01-01 ENCOUNTER — Other Ambulatory Visit: Payer: Self-pay | Admitting: Internal Medicine

## 2021-01-01 DIAGNOSIS — M5441 Lumbago with sciatica, right side: Secondary | ICD-10-CM

## 2021-01-01 MED ORDER — ACETAMINOPHEN-CODEINE #3 300-30 MG PO TABS: 1.0000 | ORAL_TABLET | ORAL | 0 refills | Status: DC | PRN

## 2021-01-01 NOTE — Telephone Encounter (Signed)
Refill Request   acetaminophen-codeine (TYLENOL #3) 300-30 MG tablet   [facility] [phone] - [address], [address] AT [address] (Ph: [phone])

## 2021-01-05 ENCOUNTER — Ambulatory Visit: Payer: Medicare HMO | Admitting: Physician Assistant

## 2021-01-09 DIAGNOSIS — J8410 Pulmonary fibrosis, unspecified: Secondary | ICD-10-CM | POA: Diagnosis not present

## 2021-01-21 NOTE — Telephone Encounter (Signed)
Called and spoke to pt. Informed her of the change from POC to portable tanks. Pt verbalized understanding. Order placed. Pt verbalized understanding and denied any further questions or concerns at this time.

## 2021-02-04 ENCOUNTER — Other Ambulatory Visit: Payer: Self-pay

## 2021-02-04 DIAGNOSIS — E7800 Pure hypercholesterolemia, unspecified: Secondary | ICD-10-CM

## 2021-02-04 DIAGNOSIS — I10 Essential (primary) hypertension: Secondary | ICD-10-CM

## 2021-02-04 DIAGNOSIS — J984 Other disorders of lung: Secondary | ICD-10-CM

## 2021-02-04 DIAGNOSIS — K219 Gastro-esophageal reflux disease without esophagitis: Secondary | ICD-10-CM

## 2021-02-04 DIAGNOSIS — E039 Hypothyroidism, unspecified: Secondary | ICD-10-CM

## 2021-02-04 MED ORDER — CARVEDILOL 25 MG PO TABS: ORAL_TABLET | ORAL | 3 refills | Status: DC

## 2021-02-04 MED ORDER — ATORVASTATIN CALCIUM 40 MG PO TABS
40.0000 mg | ORAL_TABLET | Freq: Every day | ORAL | 3 refills | Status: DC
Start: 2021-02-04 — End: 2022-01-04

## 2021-02-04 MED ORDER — LOSARTAN POTASSIUM 100 MG PO TABS: 100.0000 mg | ORAL_TABLET | Freq: Every day | ORAL | 3 refills | Status: DC

## 2021-02-04 MED ORDER — LEVOTHYROXINE SODIUM 25 MCG PO TABS: 12.5000 ug | ORAL_TABLET | Freq: Every day | ORAL | 3 refills | Status: DC

## 2021-02-04 MED ORDER — ALBUTEROL SULFATE HFA 108 (90 BASE) MCG/ACT IN AERS: 2.0000 | INHALATION_SPRAY | Freq: Four times a day (QID) | RESPIRATORY_TRACT | 3 refills | Status: DC | PRN

## 2021-02-04 MED ORDER — PANTOPRAZOLE SODIUM 40 MG PO TBEC: 40.0000 mg | DELAYED_RELEASE_TABLET | Freq: Every day | ORAL | 3 refills | Status: DC

## 2021-02-05 DIAGNOSIS — H26492 Other secondary cataract, left eye: Secondary | ICD-10-CM | POA: Diagnosis not present

## 2021-02-05 DIAGNOSIS — H35363 Drusen (degenerative) of macula, bilateral: Secondary | ICD-10-CM | POA: Diagnosis not present

## 2021-02-05 DIAGNOSIS — H4052X3 Glaucoma secondary to other eye disorders, left eye, severe stage: Secondary | ICD-10-CM | POA: Diagnosis not present

## 2021-02-05 DIAGNOSIS — H35032 Hypertensive retinopathy, left eye: Secondary | ICD-10-CM | POA: Diagnosis not present

## 2021-02-05 DIAGNOSIS — H31092 Other chorioretinal scars, left eye: Secondary | ICD-10-CM | POA: Diagnosis not present

## 2021-02-05 DIAGNOSIS — H34832 Tributary (branch) retinal vein occlusion, left eye, with macular edema: Secondary | ICD-10-CM | POA: Diagnosis not present

## 2021-02-05 DIAGNOSIS — H3582 Retinal ischemia: Secondary | ICD-10-CM | POA: Diagnosis not present

## 2021-02-07 ENCOUNTER — Other Ambulatory Visit: Payer: Self-pay | Admitting: Internal Medicine

## 2021-02-07 DIAGNOSIS — D649 Anemia, unspecified: Secondary | ICD-10-CM

## 2021-02-09 ENCOUNTER — Encounter: Payer: Medicare HMO | Admitting: Internal Medicine

## 2021-02-09 ENCOUNTER — Other Ambulatory Visit: Payer: Self-pay | Admitting: *Deleted

## 2021-02-09 DIAGNOSIS — M5441 Lumbago with sciatica, right side: Secondary | ICD-10-CM

## 2021-02-09 NOTE — Telephone Encounter (Signed)
Next appt scheduled 2/28/123 with PCP.

## 2021-02-10 ENCOUNTER — Other Ambulatory Visit: Payer: Self-pay | Admitting: Internal Medicine

## 2021-02-10 MED ORDER — ACETAMINOPHEN-CODEINE #3 300-30 MG PO TABS: 1.0000 | ORAL_TABLET | ORAL | 0 refills | Status: DC | PRN

## 2021-02-10 NOTE — Telephone Encounter (Signed)
Pt called and informed rx was sent wo [facility] on [address]; she stated this ok with her.

## 2021-03-26 ENCOUNTER — Other Ambulatory Visit: Payer: Self-pay

## 2021-03-26 DIAGNOSIS — M5441 Lumbago with sciatica, right side: Secondary | ICD-10-CM

## 2021-03-26 MED ORDER — ACETAMINOPHEN-CODEINE #3 300-30 MG PO TABS: 1.0000 | ORAL_TABLET | ORAL | 0 refills | Status: DC | PRN

## 2021-04-09 ENCOUNTER — Ambulatory Visit (INDEPENDENT_AMBULATORY_CARE_PROVIDER_SITE_OTHER): Payer: Medicare HMO | Admitting: Student

## 2021-04-09 ENCOUNTER — Telehealth: Payer: Self-pay | Admitting: Internal Medicine

## 2021-04-09 ENCOUNTER — Encounter: Payer: Self-pay | Admitting: Student

## 2021-04-09 DIAGNOSIS — R051 Acute cough: Secondary | ICD-10-CM

## 2021-04-09 DIAGNOSIS — R059 Cough, unspecified: Secondary | ICD-10-CM | POA: Insufficient documentation

## 2021-04-09 NOTE — Telephone Encounter (Signed)
Patient states she has been coughing since Monday.  Took covid test yesterday and it was negative.  Would like to have a cough medicine called into the pharmacy.

## 2021-04-09 NOTE — Assessment & Plan Note (Addendum)
Patient with hx of chronic respiratory failure on 2L [place], OSA on CPAP, restrictive lung disease asthma and allergic rhinitis who was evaluated via telephone for ongoing cough. Patient states she has had dry cough since Monday. She occasionally cough up some clear sputum. She has had some chest soreness from the coughing but denies chest pain, SOB, wheezing, fever, chills, runny nose, muscle aches, N/V or headache. The cough is worse in the evening. Her home covid test was negative. She has used some Mucinex with some relief but continues to have this ongoing cough. She received her albuterol yesterday and used it today which has helped with the cough. Patient's cough does unlikely to be from an infection based on review of systems. More likely a mild asthma exacerbation in the setting of the cold and damp weather this week. Will ask patient to continue current management and go to the ER if she develops SOB. Patient asked to call clinic Monday for an update on her symptoms.   Plan: --Continue Breo Ellipt 1 puff daily --Continue Albuterol 2 puffs every 6 hrs as needed for SOB, cough or wheezing --Continue Supplemental O2 at [facility] --Continue OTC Mucinex --Advice to go to the ER/urgent care if symptoms worsen or she develops shortness of breath.

## 2021-04-09 NOTE — Telephone Encounter (Signed)
Tele visit has been completed.

## 2021-04-09 NOTE — Telephone Encounter (Signed)
Return pt's call - stated she has been coughing since Monday. Home test negative. No fever. Non-productive cough; drinking plenty of fluids.She has taken Mucinex - stated it helped but continues to cough. Pt schedule today for telehealth appt with Dr Coy Saunas at 1430 PM.

## 2021-04-09 NOTE — Progress Notes (Signed)
° °  CC: Cough  This is a telephone encounter between [name] and [name] on 04/09/2021 for cough. The visit was conducted with the patient located at home and [name] at [facility]. The patient's identity was confirmed using their DOB and current address. The patient has consented to being evaluated through a telephone encounter and understands the associated risks (an examination cannot be done and the patient may need to come in for an appointment) / benefits (allows the patient to remain at home, decreasing exposure to coronavirus). I personally spent 16 minutes on medical discussion.   HPI:  [name] is a 86 y.o. with PMH as below.   Please see A&P for assessment of the patient's acute and chronic medical conditions.   Past Medical History:  Diagnosis Date   Allergic rhinitis, cause unspecified    Asthma    Cancer ([place])    Chronic back pain    Coronary artery calcification seen on CAT scan 04/21/2017   Enlarged pulmonary artery ([place]) 04/21/2017   By chest CT   Frequent urination 11/14/2017   GERD (gastroesophageal reflux disease) 05/19/2011   history of Bilateral leg edema-likely amlodipine induced. 07/28/2011   history of CAP (community acquired pneumonia) 07/14/2011   history of HYPOTHYROIDISM, BORDERLINE 03/20/2006   Annotation: Asymptomatic, untreated Qualifier: Diagnosis of  By: Tomasa Hosteller MD, Edmon Crape.    Hx of breast cancer    Hyperlipidemia    Hypertension    Hypothyroidism    Lumbago    Murmur, cardiac 11/22/2016   OBESITY NOS 03/20/2006   Qualifier: Diagnosis of  By: Tomasa Hosteller MD, Veronique D.    OSTEOARTHRITIS 12/13/2005   Annotation: bilateral knees;right knee injection 6/05 Qualifier: Diagnosis of  By: Tomasa Hosteller MD, Veronique D.    right shoulder pain, likely Impingement syndrome  09/09/2010   VENOUS INSUFFICIENCY, CHRONIC 03/20/2006   Qualifier: Diagnosis of  By: Tomasa Hosteller MD, Edmon Crape.    Review of Systems:   Positive for cough and chest  congestion but negative for chills, fever, muscle aches, SOB, chest pain or wheezing.   Assessment & Plan:   See Encounters Tab for problem based charting.  Patient discussed with Dr. Gardiner Sleeper, MD, MPH

## 2021-04-12 ENCOUNTER — Telehealth: Payer: Self-pay | Admitting: Internal Medicine

## 2021-04-12 NOTE — Telephone Encounter (Signed)
I have called her back and addressed her concerns.

## 2021-04-12 NOTE — Telephone Encounter (Signed)
Pt states she was instructed to call back and report how she was feeling since her visit on 04/09/2021 with Dr. Coy Saunas .  Pt states she is no better and is still coughing and thinks her breathing is not getting better when she stands up.  Pt reports she is okay when she sits down.  Please call the patient back.

## 2021-04-12 NOTE — Progress Notes (Signed)
Internal Medicine Clinic Attending  Case discussed with Dr. A[place]onsah  At the time of the visit.  We reviewed the resident's history and exam and pertinent patient test results.  I agree with the assessment, diagnosis, and plan of care documented in the resident's note.  

## 2021-04-14 ENCOUNTER — Other Ambulatory Visit: Payer: Self-pay | Admitting: Student

## 2021-04-14 ENCOUNTER — Telehealth: Payer: Self-pay | Admitting: *Deleted

## 2021-04-14 MED ORDER — BENZONATATE 100 MG PO CAPS: 100.0000 mg | ORAL_CAPSULE | Freq: Four times a day (QID) | ORAL | 1 refills | Status: DC | PRN

## 2021-04-14 NOTE — Telephone Encounter (Signed)
Call from patient-cough continues,  Would like something for her cough.  No fever.  White Phlegm when she coughs up something.  Patient stated has used Mucinex, Nyquil and Thera Flu.  No shortness of breath.  Is using her Inhaler.  Just wants something called in. Mucinex per patient gave her diarrhea.

## 2021-04-14 NOTE — Telephone Encounter (Signed)
I have called patient and addressed her concerns.

## 2021-04-14 NOTE — Progress Notes (Signed)
Patient requesting cough medication due to her lingering cough since last week.  She denies any fevers, chills, shortness of breath, sinus pain, sore throat or wheezing.  She has tried Mucinex and NyQuil without significant improvement. She is taking all her inhalers and using her portable oxygen daily.  She continues to have this lingering dry cough. Sending patient over Tessalon Perles to help with cough suppression.  Patient also advised to try OTC Robitussin as needed for cough.

## 2021-05-04 ENCOUNTER — Encounter: Payer: Medicare HMO | Admitting: Internal Medicine

## 2021-05-06 ENCOUNTER — Other Ambulatory Visit: Payer: Self-pay

## 2021-05-06 ENCOUNTER — Telehealth: Payer: Self-pay

## 2021-05-06 ENCOUNTER — Ambulatory Visit (INDEPENDENT_AMBULATORY_CARE_PROVIDER_SITE_OTHER): Payer: Medicare HMO | Admitting: Internal Medicine

## 2021-05-06 ENCOUNTER — Ambulatory Visit (HOSPITAL_COMMUNITY)
Admission: RE | Admit: 2021-05-06 | Discharge: 2021-05-06 | Disposition: A | Discharge status: Home / Self Care | Payer: Medicare HMO | Source: Ambulatory Visit | Attending: Student in an Organized Health Care Education/Training Program | Admitting: Student in an Organized Health Care Education/Training Program

## 2021-05-06 ENCOUNTER — Encounter: Payer: Self-pay | Admitting: Internal Medicine

## 2021-05-06 VITALS — BP 135/68 | HR 73 | Temp 97.8°F | Ht 62.0 in | Wt 227.9 lb

## 2021-05-06 DIAGNOSIS — R051 Acute cough: Secondary | ICD-10-CM | POA: Diagnosis not present

## 2021-05-06 DIAGNOSIS — Z0000 Encounter for general adult medical examination without abnormal findings: Secondary | ICD-10-CM

## 2021-05-06 DIAGNOSIS — I700 Atherosclerosis of aorta: Secondary | ICD-10-CM | POA: Diagnosis not present

## 2021-05-06 DIAGNOSIS — M5441 Lumbago with sciatica, right side: Secondary | ICD-10-CM | POA: Diagnosis not present

## 2021-05-06 DIAGNOSIS — R059 Cough, unspecified: Secondary | ICD-10-CM | POA: Diagnosis not present

## 2021-05-06 DIAGNOSIS — R6889 Other general symptoms and signs: Secondary | ICD-10-CM | POA: Diagnosis not present

## 2021-05-06 DIAGNOSIS — I10 Essential (primary) hypertension: Secondary | ICD-10-CM

## 2021-05-06 DIAGNOSIS — Z23 Encounter for immunization: Secondary | ICD-10-CM

## 2021-05-06 DIAGNOSIS — I872 Venous insufficiency (chronic) (peripheral): Secondary | ICD-10-CM

## 2021-05-06 MED ORDER — ACETAMINOPHEN-CODEINE #3 300-30 MG PO TABS: 1.0000 | ORAL_TABLET | ORAL | 0 refills | Status: DC | PRN

## 2021-05-06 MED ORDER — GUAIFENESIN-CODEINE 100-10 MG/5ML PO SOLN: 5.0000 mL | Freq: Four times a day (QID) | ORAL | 0 refills | Status: DC | PRN

## 2021-05-06 NOTE — Telephone Encounter (Signed)
Pa  request came through from pharmacy for pt ( GUAIFENESIN- CODEINE 100-10 MG/% ML SOL)  was submitted with office notes .. ? ? ?DECISION CAME RIGHT BACK : ? ? ? ?Denied today ? ? ?The Medicare rule in the Prescription Drug Manual (Chapter 6, Section 10.10) says over-the-counter (OTC) drugs are excluded from Medicare Part D coverage. Your drug is an over-the-counter drug. Per Medicare rules, it is not covered. ? ? ? ? ? ? ? ( [place] NOTIFIED )  ? ? ?You may want to let pt know the medication can be bought over the counter insurance is not covering medicine that is over the counter  ?

## 2021-05-06 NOTE — Assessment & Plan Note (Signed)
Patient is presenting for evaluation of persistent cough for four weeks duration. She notes that the cough is mostly dry but at times will have sputum production of white/clear mucus. She notes initial sick contact but has tested COVID negative since and denies any other infectious symptoms. She notes that the cough is worse at night when she lies down and less bothersome when she is sitting up. She denies any chest pain, worsening shortness of breath, wheezing, fever/chills, runny nose, muscle aches, or headache. She has had some relief with Mucinex and tessalon pearls but continues to have the cough.  ? ?Assessment/Plan: ?Patient has a history of restrictive lung disease 2/2 ILD, asthma and allergic rhinitis and GERD. Given recent sick contact, may have some residual cough from viral illness. However, given history of ILD will obtain chest imaging. Also, with history of GERD and mostly night time cough, may have cough secondary to her GERD although she is on PPI therapy and this is less likely.  ? ?Plan: ?CXR  ?Guaifenesin-codeine 14ml q6h prn  ?Continue Breo-Ellipta 1 puff daily ?Continue albuterol 2 puff q6h prn  ?Continue supplemental oxygen at 2L via [place] ?Follow up as needed  ?

## 2021-05-06 NOTE — Assessment & Plan Note (Signed)
Patient has trace edema of bilateral lower extremities on examination to mid calf. Prior Echo in 01/2020 with grade I diastolic dysfunction. Patient does not have other signs of hypervolemia at this time. She has not been wearing compression stockings ? ?Plan: ?Advised to wear compression stockings ?If persistent, can trial low dose lasix for lower extremity edema  ?

## 2021-05-06 NOTE — Assessment & Plan Note (Signed)
Patient received flu vaccine today ?Tdap not available in clinic at this visit; will administer at next visit  ?

## 2021-05-06 NOTE — Assessment & Plan Note (Signed)
BP Readings from Last 3 Encounters:  ?05/06/21 135/68  ?12/16/20 128/82  ?10/21/20 126/70  ? ?Patient's blood pressure at goal today. She denies any symptoms at this time.  ? ?Plan: ?Continue amlodipine 10mg  daily, coreg 25mg  bid, and losartan 100mg  daily ? ?

## 2021-05-06 NOTE — Patient Instructions (Addendum)
Ms [name], ? ?It was a pleasure seeing you in clinic. Today we discussed:  ? ?Cough:  ?We are ordering a chest x-ray and will send a prescription of cough syrup to your pharmacy. Please do not take your Tylenol #3 while taking the cough syrup. I will call you with the chest x-ray results. Please let us know if your cough continues despite the cough syrup.  ? ?You received your flu vaccine at this visit.  ? ?If you have any questions or concerns, please call our clinic at [phone] between 9am-5pm and after hours call [phone] and ask for the internal medicine resident on call. If you feel you are having a medical emergency please call 911.  ? ?Thank you, we look forward to helping you remain healthy! ? ? ? ?

## 2021-05-06 NOTE — Progress Notes (Signed)
? ?  CC: cough ? ?HPI: ? ?[name] is a 86 y.o. female with PMHx as stated below presenting for evaluation of persistent cough for four weeks. Please see problem based charting for complete assessment and plan. ? ? ?Past Medical History:  ?Diagnosis Date  ? Allergic rhinitis, cause unspecified   ? Asthma   ? Cancer [facility])   ? Chronic back pain   ? Coronary artery calcification seen on CAT scan 04/21/2017  ? Enlarged pulmonary artery ([place]) 04/21/2017  ? By chest CT  ? Frequent urination 11/14/2017  ? GERD (gastroesophageal reflux disease) 05/19/2011  ? history of Bilateral leg edema-likely amlodipine induced. 07/28/2011  ? history of CAP (community acquired pneumonia) 07/14/2011  ? history of HYPOTHYROIDISM, BORDERLINE 03/20/2006  ? Annotation: Asymptomatic, untreated Qualifier: Diagnosis of  By: Tomasa Hosteller MD, Edmon Crape.   ? Hx of breast cancer   ? Hyperlipidemia   ? Hypertension   ? Hypothyroidism   ? Lumbago   ? Murmur, cardiac 11/22/2016  ? OBESITY NOS 03/20/2006  ? Qualifier: Diagnosis of  By: Tomasa Hosteller MD, Edmon Crape.   ? OSTEOARTHRITIS 12/13/2005  ? Annotation: bilateral knees;right knee injection 6/05 Qualifier: Diagnosis of  By: Tomasa Hosteller MD, Veronique D.   ? right shoulder pain, likely Impingement syndrome  09/09/2010  ? VENOUS INSUFFICIENCY, CHRONIC 03/20/2006  ? Qualifier: Diagnosis of  By: Tomasa Hosteller MD, Edmon Crape.   ? ?Review of Systems:  Negative except as stated in HPI. ? ?Physical Exam: ? ?Vitals:  ? 05/06/21 1120  ?BP: (!) 149/73  ?Pulse: 80  ?Temp: 97.8 ?F (36.6 ?C)  ?TempSrc: Oral  ?SpO2: 94%  ?Weight: 227 lb 14.4 oz (103.4 kg)  ?Height: 5\' 2"  (1.575 m)  ? ?Physical Exam  ?Constitutional: Elderly female, no acute distress  ?Cardiovascular: Normal rate, regular rhythm, S1 and S2 present, no murmurs, rubs, gallops.  Distal pulses intact ?Respiratory: No respiratory distress,Lungs are clear to auscultation bilaterally. Saturating well on room air  ?Musculoskeletal: Normal bulk and tone.  Trace pitting edema of  bilat lower extremities  ?Neurological: Is alert and oriented x4, no apparent focal deficits noted. ?Skin: Warm and dry.  No rash, erythema, lesions noted. ?Psychiatric: Normal mood and affect.  ? ?Assessment & Plan:  ? ?See Encounters Tab for problem based charting. ? ?Patient discussed with Dr. Dareen Piano ? ?

## 2021-05-07 ENCOUNTER — Other Ambulatory Visit: Payer: Self-pay

## 2021-05-07 MED ORDER — DICLOFENAC SODIUM 1 % EX GEL: CUTANEOUS | 0 refills | Status: DC

## 2021-05-07 NOTE — Telephone Encounter (Signed)
[phone]  ? ? Usually takes 72 hours will notify patient by mail and will fax decision to office   ?

## 2021-05-07 NOTE — Telephone Encounter (Signed)
Called [employer] spoke to the rep [facility] ) explained that medication is not over the counter  it has  codeine  a controlled substances .... was  patched through to do an appeal  for medication .. ? ? ? REF # [id number] ?

## 2021-05-07 NOTE — Progress Notes (Signed)
Internal Medicine Clinic Attending  I saw and evaluated the patient.  I personally confirmed the key portions of the history and exam documented by Dr. Aslam and I reviewed pertinent patient test results.  The assessment, diagnosis, and plan were formulated together and I agree with the documentation in the resident's note.     

## 2021-05-10 ENCOUNTER — Other Ambulatory Visit: Payer: Self-pay | Admitting: Internal Medicine

## 2021-05-10 MED ORDER — GUAIFENESIN-CODEINE 100-10 MG/5ML PO SOLN: 5.0000 mL | Freq: Four times a day (QID) | ORAL | 0 refills | Status: DC | PRN

## 2021-05-10 NOTE — Telephone Encounter (Signed)
Refill Request- Pt stating she is still coughing and is out of her medication. ? ?guaiFENesin-codeine 100-10 MG/5ML syrup ? ?[facility] [phone] - [place], [place] AT [address] (Ph: [phone]) ?

## 2021-05-12 NOTE — Telephone Encounter (Signed)
Call to patient informed per Dr. Dareen Piano to take cough medication at night and if cough is bad.  Patient was also informed of the  need to follow up with her Pulmonary doctor soon.  Patient voiced understanding of the plan patient was notified of the appointment with Dr. Katherene Ponto this month.  Will call Pulmonary doctor to set up an appointment with her Pulmonary doctor for follow up. ? ? ?

## 2021-05-26 ENCOUNTER — Ambulatory Visit (INDEPENDENT_AMBULATORY_CARE_PROVIDER_SITE_OTHER): Payer: Medicare HMO | Admitting: Internal Medicine

## 2021-05-26 VITALS — BP 146/84 | HR 86 | Temp 97.9°F | Ht 62.0 in | Wt 233.6 lb

## 2021-05-26 DIAGNOSIS — Z0000 Encounter for general adult medical examination without abnormal findings: Secondary | ICD-10-CM

## 2021-05-26 DIAGNOSIS — M5441 Lumbago with sciatica, right side: Secondary | ICD-10-CM | POA: Diagnosis not present

## 2021-05-26 DIAGNOSIS — Z23 Encounter for immunization: Secondary | ICD-10-CM | POA: Diagnosis not present

## 2021-05-26 DIAGNOSIS — R6889 Other general symptoms and signs: Secondary | ICD-10-CM | POA: Diagnosis not present

## 2021-05-26 DIAGNOSIS — I10 Essential (primary) hypertension: Secondary | ICD-10-CM | POA: Diagnosis not present

## 2021-05-26 DIAGNOSIS — I872 Venous insufficiency (chronic) (peripheral): Secondary | ICD-10-CM

## 2021-05-26 DIAGNOSIS — E039 Hypothyroidism, unspecified: Secondary | ICD-10-CM

## 2021-05-26 DIAGNOSIS — M19011 Primary osteoarthritis, right shoulder: Secondary | ICD-10-CM

## 2021-05-26 DIAGNOSIS — E7800 Pure hypercholesterolemia, unspecified: Secondary | ICD-10-CM

## 2021-05-26 DIAGNOSIS — J4530 Mild persistent asthma, uncomplicated: Secondary | ICD-10-CM | POA: Diagnosis not present

## 2021-05-26 DIAGNOSIS — I288 Other diseases of pulmonary vessels: Secondary | ICD-10-CM

## 2021-05-26 DIAGNOSIS — J9611 Chronic respiratory failure with hypoxia: Secondary | ICD-10-CM | POA: Diagnosis not present

## 2021-05-26 DIAGNOSIS — R051 Acute cough: Secondary | ICD-10-CM

## 2021-05-26 DIAGNOSIS — D509 Iron deficiency anemia, unspecified: Secondary | ICD-10-CM

## 2021-05-26 MED ORDER — ACETAMINOPHEN-CODEINE #3 300-30 MG PO TABS: 1.0000 | ORAL_TABLET | ORAL | 0 refills | Status: DC | PRN

## 2021-05-26 NOTE — Patient Instructions (Addendum)
-  It was a pleasure seeing you today ?-Your blood pressure is mildly elevated today.  We will continue you on your current medications for now ?-We will check some blood work on you today including your kidney function, thyroid level, iron studies and blood counts ?-I will put you in for a repeat ultrasound of your heart to make sure that your lung disease is not affecting your heart ?-I will put in a refill for your pain medications ?-Please call me if you have any questions or if you need any refills ?

## 2021-05-27 LAB — BMP8+ANION GAP
Anion Gap: 14 mmol/L (ref 10.0–18.0)
BUN/Creatinine Ratio: 26 (ref 12–28)
BUN: 17 mg/dL (ref 8–27)
CO2: 26 mmol/L (ref 20–29)
Calcium: 10.1 mg/dL (ref 8.7–10.3)
Chloride: 98 mmol/L (ref 96–106)
Creatinine, Ser: 0.66 mg/dL (ref 0.57–1.00)
Glucose: 98 mg/dL (ref 70–99)
Potassium: 4.3 mmol/L (ref 3.5–5.2)
Sodium: 138 mmol/L (ref 134–144)
eGFR: 87 mL/min/{1.73_m2} (ref >59)

## 2021-05-27 LAB — CBC WITH DIFFERENTIAL/PLATELET
Basophils Absolute: 0.1 10*3/uL (ref 0.0–0.2)
Basos: 1 %
EOS (ABSOLUTE): 0.4 10*3/uL (ref 0.0–0.4)
Eos: 5 %
Hematocrit: 39.4 % (ref 34.0–46.6)
Hemoglobin: 13.2 g/dL (ref 11.1–15.9)
Immature Grans (Abs): 0 10*3/uL (ref 0.0–0.1)
Immature Granulocytes: 0 %
Lymphocytes Absolute: 1.6 10*3/uL (ref 0.7–3.1)
Lymphs: 21 %
MCH: 28.2 pg (ref 26.6–33.0)
MCHC: 33.5 g/dL (ref 31.5–35.7)
MCV: 84 fL (ref 79–97)
Monocytes Absolute: 0.7 10*3/uL (ref 0.1–0.9)
Monocytes: 9 %
Neutrophils Absolute: 5.2 10*3/uL (ref 1.4–7.0)
Neutrophils: 64 %
Platelets: 212 10*3/uL (ref 150–450)
RBC: 4.68 x10E6/uL (ref 3.77–5.28)
RDW: 13.5 % (ref 11.7–15.4)
WBC: 8 10*3/uL (ref 3.4–10.8)

## 2021-05-27 LAB — FERRITIN: Ferritin: 141 ng/mL (ref 15–150)

## 2021-05-27 LAB — IRON AND TIBC
Iron Saturation: 18 % (ref 15–55)
Iron: 51 ug/dL (ref 27–139)
Total Iron Binding Capacity: 284 ug/dL (ref 250–450)
UIBC: 233 ug/dL (ref 118–369)

## 2021-05-27 LAB — LIPID PANEL
Chol/HDL Ratio: 2.2 ratio (ref 0.0–4.4)
Cholesterol, Total: 142 mg/dL (ref 100–199)
HDL: 65 mg/dL (ref >39)
LDL Chol Calc (NIH): 65 mg/dL (ref 0–99)
Triglycerides: 57 mg/dL (ref 0–149)
VLDL Cholesterol Cal: 12 mg/dL (ref 5–40)

## 2021-05-27 LAB — TSH: TSH: 2.98 u[IU]/mL (ref 0.450–4.500)

## 2021-05-27 NOTE — Assessment & Plan Note (Signed)
-   This problem is chronic and stable ?-Patient was noted to have a persistently enlarged pulm artery on her chest CTs ?-Her last echo did not show any evidence of pulmonary hypertension but patient with trace lower extremity edema and systolic murmur on exam today.  I am concerned that she may be developing secondary pulmonary hypertension from her ILD ?-We will repeat a 2D echo for further evaluation today ?-No further work-up for now ?

## 2021-05-27 NOTE — Assessment & Plan Note (Addendum)
BP Readings from Last 3 Encounters:  ?05/26/21 (!) 146/84  ?05/06/21 135/68  ?12/16/20 128/82  ? ? ?Lab Results  ?Component Value Date  ? NA 138 05/26/2021  ? K 4.3 05/26/2021  ? CREATININE 0.66 05/26/2021  ? ? ?Assessment: ?Blood pressure control:  Fair ?Progress toward BP goal:   Unchanged ?Comments: Patient is compliant with losartan 100 mg daily, amlodipine 10 mg daily and carvedilol 25 mg twice daily  ? ?Plan: ?Medications:  continue current medications ?Educational resources provided:   ?Actor tools provided:   ?Other plans: BMP was within normal limits at this visit.  No further work-up at this time ? ?Addendum: ?-Results discussed with patient via phone.  She expressed understanding and is in agreement with plan ?

## 2021-05-27 NOTE — Assessment & Plan Note (Signed)
-   This problem is chronic and stable ?-Patient complains of pain in her bilateral knees but states that it is well controlled with Voltaren gel ?-We will continue Voltaren gel at this time ?-No further work-up for now ?

## 2021-05-27 NOTE — Assessment & Plan Note (Addendum)
-   This problem is chronic and stable ?-Patient on Lipitor 40 mg daily ?-Repeat lipid panel done on this visit showed an LDL of 65 with HDL 47. ?-We will continue with current medication regimen for now ?-No further work-up at this time ? ?Addendum: ?-Results discussed with patient via phone.  No further work-up at this time ?-Patient will continue current medication regimen ?-Patient expressed understanding and is in agreement with plan ?

## 2021-05-27 NOTE — Assessment & Plan Note (Signed)
-   This problem is chronic and stable ?-Patient's hypoxic respiratory failure secondary to obesity/restrictive lung disease as well as mild ILD ?-Patient states that her shortness of breath is at baseline and that she uses 2 L O2 at night as well as with exertion ?-Patient's O2 sats here were 96% on room air ?-Patient to follow-up with pulmonology in April ?-No further work-up at this time ?

## 2021-05-27 NOTE — Assessment & Plan Note (Addendum)
-   This problem is chronic and stable ?-Patient is compliant with levothyroxine 12.5 mcg daily ?-TSH was repeated at this visit and was normal at 2.98 ?-No further work-up at this time  ? ?Addendum: ?-Results discussed with patient via phone.  No further work-up at this time ?-Patient expressed understanding and is agreeable with plan ? ?

## 2021-05-27 NOTE — Assessment & Plan Note (Signed)
-   Tetanus vaccine given at this visit ?-We will discuss shingles vaccine at her next visit ?

## 2021-05-27 NOTE — Progress Notes (Signed)
? ?  Subjective:  ? ? Patient ID: [name], female    DOB: Jun 02, 1935, 86 y.o.   MRN: [id number] ? ?Medication Refill ? ?Hypertension ? ? ?I have seen and examined this patient.  Patient is here for routine follow-up of her hypertension and hypothyroidism. ? ?Patient was recently seen in the clinic for persistent cough after viral URI.  Patient states that her cough is now resolved.  She denies any other complaints at this time and states that she is compliant with all her medications. ? ?Review of Systems  ?Constitutional: Negative.   ?HENT: Negative.    ?Respiratory: Negative.    ?Cardiovascular: Negative.   ?Gastrointestinal: Negative.   ?Musculoskeletal: Negative.   ?Neurological: Negative.   ?Psychiatric/Behavioral: Negative.    ? ?   ?Objective:  ? Physical Exam ?Constitutional:   ?   Appearance: Normal appearance.  ?HENT:  ?   Head: Normocephalic and atraumatic.  ?Cardiovascular:  ?   Rate and Rhythm: Normal rate and regular rhythm.  ?   Heart sounds: Murmur heard.  ?   Comments: Systolic murmur grade 2 out of 6 best noted in right second intercostal space ?Pulmonary:  ?   Effort: Pulmonary effort is normal.  ?   Breath sounds: Normal breath sounds. No wheezing or rales.  ?Abdominal:  ?   General: Bowel sounds are normal. There is no distension.  ?   Palpations: Abdomen is soft.  ?   Tenderness: There is no abdominal tenderness.  ?Musculoskeletal:     ?   General: Swelling present. No tenderness.  ?   Cervical back: Neck supple.  ?   Comments: Patient with trace bilateral lower extremity pitting edema noted on exam  ?Lymphadenopathy:  ?   Cervical: No cervical adenopathy.  ?Neurological:  ?   Mental Status: She is alert and oriented to person, place, and time.  ?Psychiatric:     ?   Mood and Affect: Mood normal.     ?   Behavior: Behavior normal.  ? ? ? ? ? ?   ?Assessment & Plan:  ? ?Please see problem based charting for assessment and plan: ?

## 2021-05-27 NOTE — Assessment & Plan Note (Signed)
-   This problem is chronic and stable ?-Patient's weight is down approximately 17 pounds from last year ?-Patient encouraged to continue with her diet and exercise ?-No further work-up at this time ?

## 2021-05-27 NOTE — Assessment & Plan Note (Signed)
-   Patient had a postviral cough lasting approximately 4 to 5 weeks after her initial URI ?-At her last visit this cough was persistent and she was given guaifenesin/codeine for this which improved her symptoms ?-Patient states that her cough is now resolved ?-Lungs are clear to auscultation on exam ?-No further work-up at this time ?

## 2021-05-27 NOTE — Assessment & Plan Note (Addendum)
-   This problem is chronic and stable ?-Patient did have an EGD and colonoscopy done in 2020 for this issue and had no evidence of malignancy but did have a small AVM in her duodenum ?-Repeat CBC done today showed a normal hemoglobin of 13.2 and normal iron studies ?-Patient is on daily iron supplementation.  Will DC this and repeat her CBC and iron studies at her next visit to ensure that she is stable at this ?-No further work-up at this time ? ?Addendum: ?-Results discussed with patient via phone.  She is in agreement plan and will DC her iron pills.  We will repeat her CBC and iron studies at next visit ?-No further work-up at this time ?

## 2021-05-27 NOTE — Assessment & Plan Note (Signed)
-   This problem is chronic and stable ?-Patient is compliant with Breo Ellipta 1 puff daily as well as albuterol as needed ?-Patient states that she has not had to use her albuterol inhaler much ?-On exam today no wheezes noted and her lungs are clear to auscultation ?-No further work-up at this time ?

## 2021-05-27 NOTE — Assessment & Plan Note (Signed)
-   This problem is chronic and stable ?-Patient stated her back pain is well controlled on Tylenol 3 which she takes as needed ?-We will put in a refill of her Tylenol 3 today ?-This allows her to do her daily activities and the benefits of this medication outweigh the risks ?-No further work-up at this time ?

## 2021-05-27 NOTE — Assessment & Plan Note (Signed)
-   this problem is chronic and stable ?-Patient is noted to have trace bilateral lower extremity edema on exam today ?-Patient's last echo was in November 2021 and showed a normal EF with grade 1 diastolic dysfunction ?-However, patient was noted to have systolic murmur on exam today and given that she has an enlarged pulmonary artery on her prior CTs I am concerned that she may be developing some pulmonary hypertension and right heart failure from her ILD ?-We will recheck a 2D echo today ?-Patient advised to continue to wear compression stockings ?-No further work-up at this time ?

## 2021-06-10 ENCOUNTER — Ambulatory Visit (INDEPENDENT_AMBULATORY_CARE_PROVIDER_SITE_OTHER): Payer: Medicare HMO | Admitting: Internal Medicine

## 2021-06-10 ENCOUNTER — Encounter: Payer: Self-pay | Admitting: Internal Medicine

## 2021-06-10 VITALS — BP 128/70 | HR 89 | Temp 98.3°F | Ht 62.0 in | Wt 232.6 lb

## 2021-06-10 DIAGNOSIS — G4733 Obstructive sleep apnea (adult) (pediatric): Secondary | ICD-10-CM

## 2021-06-10 DIAGNOSIS — J986 Disorders of diaphragm: Secondary | ICD-10-CM

## 2021-06-10 DIAGNOSIS — R011 Cardiac murmur, unspecified: Secondary | ICD-10-CM

## 2021-06-10 DIAGNOSIS — R0609 Other forms of dyspnea: Secondary | ICD-10-CM

## 2021-06-10 DIAGNOSIS — R6889 Other general symptoms and signs: Secondary | ICD-10-CM | POA: Diagnosis not present

## 2021-06-10 NOTE — Progress Notes (Signed)
? ? ? ? ?PCP Aldine Contes, MD ? ? ?HPI ? ? ?IOV 01/16/2017 ? ?Chief Complaint  ?Patient presents with  ? Advice Only  ?  Referred by Dr. Dareen Piano due to abn. PFT 01/04/17. States that she has occ. coughing with burning in chest.  ? ?86 year old morbidly obese female.  Presents for new consult.  History is gleaned from talking to her and review of the primary care physician chart.  Patient reports a one-year history of shortness of breath that is of insidious onset it is not necessarily progressive.  It is definitely present on exertion.  She notices it when she would walk "quite a ways".  She could not quantify this.  In talking to her she tells me that she is not short of breath for activities of daily living inside the house such as changing clothes or doing the bed but she is short of breath if she were to climb a flight of stairs she would have to stop one time.  At this point shortness of breath is rated as moderate.  Rest relieves shortness of breath along with an albuterol rescue inhaler that she has been using for the last 1 week.  Associated with shortness of breath is a history of wheezing and coughing that is of much milder intensity for which the albuterol definitely helps.  The cough is dry in quality.  Med review shows that she is on ACE inhibitor ? ?Personal visualization of chest x-rays dating back to 2013 and going forward to 2017 show chronic elevation of the left hemidiaphragm.  She does have a CT abdomen/pelvis done in 2015.  The lung cuts do show some basal pulmonary infiltrates but these could easily be atelectasis as opposed to ILD findings on account of her raised left hemidiaphragm.  Her pulmonary function test done on January 04, 2017 that I personally visualize shows restriction with reduced diffusion capacity of moderate severity [name] is morbidly obese] ? ?Review of the chart shows that she has a systolic murmur for which an echo has been ordered although I do not see the echo  result in the computer.  Her recent hemoglobin and creatinine are normal as documented below. ? ? ?Walking desaturation test on 01/16/2017 185 feet x 3 laps:  did  ONLY 1 lap and stopped due to dsypnea and wheeze. Did NOT desaturate. Rest pulse ox was 100%, final pulse ox was 97%. HR response 77/min at rest to 115/min at peak exertion. ? ? ? ?OV 02/07/2017 ? ?Chief Complaint  ?Patient presents with  ? Follow-up  ?  follow up for sob, only feels sob if walking a long way  ? ?Follow-up multifactorial dyspnea and cough ? ?Cough: This is completely resolved after stopping ACE inhibitor ? ?Shortness of breath: This is also improved significantly after stopping ACE inhibitor.  She still has some residual dyspnea on exertion relieved by rest no associated chest pain.  For this she had workup with a high-resolution CT chest.  Formal thoracic radiology interpretation is pending but in my personal visualization she only has raised left hemidiaphragm but no interstitial lung disease.  She was supposed to have significant test for this  raised hemidiaphragm but this was canceled presumably by the patient but she denies knowledge of this.  Her morbid obesity continues.  She cannot do pulmonary rehabilitation because of joint issues.  Nevertheless overall she is able to walk longer distances. ? ? ? ? ?Results for YAFFA, SECKMAN (MRN [id number]) as of 01/16/2017  09:40 ? Ref. Range 01/04/2017 09:54  ?FVC-Pre Latest Units: L 1.22  ?FVC-%Pred-Pre Latest Units: % 66  ?FEV1-Pre Latest Units: L 0.99  ?FEV1-%Pred-Pre Latest Units: % 69  ?Pre FEV1/FVC ratio Latest Units: % 81  ?Results for YVANNA, VIDAS (MRN [id number]) as of 01/16/2017 09:40 ? Ref. Range 01/04/2017 09:54  ?TLC Latest Units: L 2.93  ?TLC % pred Latest Units: % 61  ?Results for CRISTIE, MCKINNEY (MRN [id number]) as of 01/16/2017 09:40 ? Ref. Range 01/04/2017 09:54  ?DLCO unc Latest Units: ml/min/mmHg 10.30  ?DLCO unc % pred Latest Units: % 47  ? ? ?OV 01/01/2020 ? ?Subjective:   ?Patient ID: [name], female , DOB: 1935-10-31 , age 76 y.o. , MRN: [id number] , AD[address]ESS: [address] ?[place] [address] ?PCP Aldine Contes, MD ?Patient Care Team: ?Aldine Contes, MD as PCP - General ? ?This Provider for this visit: Treatment Team:  ?Attending Provider: Brand Males, MD ? ? ? ?01/01/2020 -   ?Chief Complaint  ?Patient presents with  ? Follow-up  ?  sob with walking in the house, started a year ago, has gotten worse.  ? ? ? ?HPI ?[name] 86 y.o. -returns after nearly 3 years.  This is an established visit because technically is not 3 years yet.  Last CT scan of the chest was in January 2020.  She has stable right middle lobe nodule 5 mm.  She has elevated diaphragm on the left side [in 2018 it was noted on sniff test there is less movement here with demonstration but no paralysis].  Her last PFTs was in 2018.  Most recent blood work was in October 2021 with a creatinine that is normal 0.94 mg percent and April 2021 CBC of 11.7 g% at baseline compared to 2020.  She had a stress test March 2019 with good ejection fraction and deemed as low risk study.  Echocardiogram January 2020 with ejection fraction that is good with grade 1 diastolic dysfunction  ? ?she tells me that since she saw me almost 3 years ago she has got progressive shortness of breath. Cough is associated [name] present with shortness of breath.  There is also wheezing with exertional dyspnea.  She also snoring at night and on occasion has excessive daytime somnolence. ? ? ? ?She was sitting there on 2 L nasal cannula ? ? ? ? ? ?ROS ? ?OV 06/10/2021 ? ?Subjective:  ?Patient ID: [name], female , DOB: 1935/10/01 , age 84 y.o. , MRN: [id number] , [address]ESS: [address] ?[place] [id number] ?PCP Aldine Contes, MD ?Patient Care Team: ?Aldine Contes, MD as PCP - General ? ?This Provider for this visit: Treatment Team:  ?Attending Provider: Brand Males, MD ? ? ? ?06/10/2021 -   ?Chief  Complaint  ?Patient presents with  ? Follow-up  ?  Pt states she has been doing okay since last visit and denies any real complaints.  ? ?Multifactorial dyspnea from morbid obesity associated sleep apnea, paralyzed hemidiaphragm, ejection systolic murmur.  And grade 1 diastolic dysfunction in November 2021 echo.  Abnormal CT scan of the chest October 2021 with possible early ILD.  Possible asthma on inhaler therapy with Breo ? ?HPI ?[name] 86 y.o. -returns for follow-up.  I last saw her in October 2021 at which time initiated the dyspnea work-up.  It appears since then based on review of the chart she is on CPAP nightly with Dr. Halford Chessman.  She also  tells me she is on oxygen x1 year for night and with exertion 2 L but not at rest.  Overall she feels she is better and stable.  She is able to walk longer but she attributes this to oxygen.  Without the oxygen her dyspnea is worse.  In the interim there are no emergency room visits or urgent care visits.  She states no new medical problems.  The only medication change in the last 6 months is that her iron supplement has been removed. ? ?Recent lab 05/26/2021: Reviewed CBC normal ? ?She was sitting there on 2 L nasal cannula.  I turned the oxygen off and 10 minutes later when we rechecked her pulse ox was 92% on room air at rest 06/10/21 ? ?CT Chest data ? ?No results found. ? ? ?SYMPTOM SCALE - ILD 01/01/2020 ?Last Weight  Most recent update: 01/01/2020 11:12 AM  ? ? Weight  ?112.9 kg (248 lb 12.8 oz)  ?      ? ?  ? ?  ?O2 use ra  ?Shortness of Breath 0 -> 5 scale with 5 being worst (score 6 If unable to do)  ?At rest 4  ?Simple tasks - showers, clothes change, eating, shaving 5  ?Household (dishes, doing bed, laundry) 5  ?Shopping 5  ?Walking level at own pace 5  ?Walking up Stairs 6  ?Total (30-36) Dyspnea Score x  ?How bad is your cough? Yes with wheeze  ?How bad is your fatigue yes  ?How bad is nausea 00  ?How bad is vomiting? ? 0  ?How bad is diarrhea? 0  ?How bad is  anxiety? 0  ?How bad is depression 0  ? ? ?PFT ? ? ?  Latest Ref Rng & Units 03/10/2020  ?  3:08 PM 01/04/2017  ?  9:54 AM  ?PFT Results  ?FVC-Pre L 0.98   1.22    ?FVC-Predicted Pre % 56   66    ?FVC-Post L  1.31

## 2021-06-10 NOTE — Patient Instructions (Addendum)
ICD-10-CM   1. Dyspnea on exertion  R06.09     2. Elevated hemidiaphragm  J98.6     3. OSA (obstructive sleep apnea)  G47.33     4. Severe obesity (BMI >= 40) (HCC)  E66.01     5. Systolic murmur  R01.1       Glad overall stable x 1 year but OCt 2021 CT chest was notnormal  Plan  - do HRCT supine and prone  - do full PFT  - do echo (you said you have an appt for this but I do not see it onour systeem) - cotninue o2 at night/exretion and cPAP at night  Followup  - 1 month with APP to review results 

## 2021-06-18 ENCOUNTER — Other Ambulatory Visit: Payer: Self-pay | Admitting: Internal Medicine

## 2021-06-18 DIAGNOSIS — J984 Other disorders of lung: Secondary | ICD-10-CM

## 2021-06-24 DIAGNOSIS — H26492 Other secondary cataract, left eye: Secondary | ICD-10-CM | POA: Diagnosis not present

## 2021-06-24 DIAGNOSIS — H401123 Primary open-angle glaucoma, left eye, severe stage: Secondary | ICD-10-CM | POA: Diagnosis not present

## 2021-06-24 DIAGNOSIS — H524 Presbyopia: Secondary | ICD-10-CM | POA: Diagnosis not present

## 2021-06-24 DIAGNOSIS — H25811 Combined forms of age-related cataract, right eye: Secondary | ICD-10-CM | POA: Diagnosis not present

## 2021-06-24 DIAGNOSIS — Z0100 Encounter for examination of eyes and vision without abnormal findings: Secondary | ICD-10-CM | POA: Diagnosis not present

## 2021-06-24 DIAGNOSIS — R6889 Other general symptoms and signs: Secondary | ICD-10-CM | POA: Diagnosis not present

## 2021-06-24 DIAGNOSIS — H348322 Tributary (branch) retinal vein occlusion, left eye, stable: Secondary | ICD-10-CM | POA: Diagnosis not present

## 2021-06-24 DIAGNOSIS — Z961 Presence of intraocular lens: Secondary | ICD-10-CM | POA: Diagnosis not present

## 2021-06-24 DIAGNOSIS — H31092 Other chorioretinal scars, left eye: Secondary | ICD-10-CM | POA: Diagnosis not present

## 2021-06-30 ENCOUNTER — Other Ambulatory Visit: Payer: Self-pay

## 2021-06-30 DIAGNOSIS — M5441 Lumbago with sciatica, right side: Secondary | ICD-10-CM

## 2021-06-30 MED ORDER — ACETAMINOPHEN-CODEINE #3 300-30 MG PO TABS: 1.0000 | ORAL_TABLET | ORAL | 0 refills | Status: DC | PRN

## 2021-06-30 NOTE — Telephone Encounter (Signed)
Rx for T #3 cancelled with Gabriella at [facility]. Rx may now be sent to [facility]. ?

## 2021-06-30 NOTE — Telephone Encounter (Signed)
acetaminophen-codeine (TYLENOL #3) 300-30 MG tablet, refill request @ [facility] [phone] - [place], [place] AT [address]. ?

## 2021-07-07 ENCOUNTER — Other Ambulatory Visit: Payer: Self-pay | Admitting: Student

## 2021-07-07 DIAGNOSIS — I10 Essential (primary) hypertension: Secondary | ICD-10-CM

## 2021-07-09 ENCOUNTER — Other Ambulatory Visit: Payer: Self-pay | Admitting: Internal Medicine

## 2021-07-19 ENCOUNTER — Ambulatory Visit (HOSPITAL_COMMUNITY): Payer: Medicare HMO

## 2021-07-21 ENCOUNTER — Other Ambulatory Visit: Payer: Medicare HMO

## 2021-07-21 ENCOUNTER — Telehealth: Payer: Self-pay | Admitting: Internal Medicine

## 2021-07-21 NOTE — Telephone Encounter (Signed)
RTC to patient stated that cough is better.   Now has stuffiness in left side of nose and headache.  No fever or chills .Marland Kitchen  Feels ok except for the stuffiness in the left nostril.  Mucous is clear when she coughs. ?

## 2021-07-21 NOTE — Telephone Encounter (Signed)
Pt requesting a call back.  Pt states she still has a cough that has gotten a little better with the Cough med prescribed, but now her head is really hurting and she would like to know what to do now. ?

## 2021-07-22 ENCOUNTER — Ambulatory Visit (INDEPENDENT_AMBULATORY_CARE_PROVIDER_SITE_OTHER): Payer: Medicare HMO | Admitting: Internal Medicine

## 2021-07-22 DIAGNOSIS — J069 Acute upper respiratory infection, unspecified: Secondary | ICD-10-CM | POA: Diagnosis not present

## 2021-07-22 MED ORDER — GUAIFENESIN-CODEINE 100-10 MG/5ML PO SOLN: 5.0000 mL | Freq: Four times a day (QID) | ORAL | 0 refills | Status: DC | PRN

## 2021-07-22 NOTE — Progress Notes (Signed)
   CC: Upper respiratory symptom  This is a telephone encounter between [name] and Timothy Lasso on 07/22/2021 for CC listed above. The visit was conducted with the patient located at home and Timothy Lasso at [facility]. The patient's identity was confirmed using their DOB and current address. The patient has consented to being evaluated through a telephone encounter and understands the associated risks (an examination cannot be done and the patient may need to come in for an appointment) / benefits (allows the patient to remain at home, decreasing exposure to coronavirus). I personally spent 20 minutes on medical discussion.   HPI:  Ms.ELISAMA THISSEN is a 86 y.o. with PMH as below.   Please see A&P for assessment of the patient's acute and chronic medical conditions.   Past Medical History:  Diagnosis Date   Allergic rhinitis, cause unspecified    Asthma    Cancer ([place])    Chronic back pain    Coronary artery calcification seen on CAT scan 04/21/2017   Enlarged pulmonary artery ([place]) 04/21/2017   By chest CT   Frequent urination 11/14/2017   GERD (gastroesophageal reflux disease) 05/19/2011   history of Bilateral leg edema-likely amlodipine induced. 07/28/2011   history of CAP (community acquired pneumonia) 07/14/2011   history of HYPOTHYROIDISM, BORDERLINE 03/20/2006   Annotation: Asymptomatic, untreated Qualifier: Diagnosis of  By: Tomasa Hosteller MD, Edmon Crape.    Hx of breast cancer    Hyperlipidemia    Hypertension    Hypothyroidism    Lumbago    Murmur, cardiac 11/22/2016   OBESITY NOS 03/20/2006   Qualifier: Diagnosis of  By: Tomasa Hosteller MD, Veronique D.    OSTEOARTHRITIS 12/13/2005   Annotation: bilateral knees;right knee injection 6/05 Qualifier: Diagnosis of  By: Tomasa Hosteller MD, Veronique D.    right shoulder pain, likely Impingement syndrome  09/09/2010   VENOUS INSUFFICIENCY, CHRONIC 03/20/2006   Qualifier: Diagnosis of  By: Tomasa Hosteller MD, Edmon Crape.    Review of Systems:  Review of  Systems  Constitutional:  Negative for chills, fever and malaise/fatigue.  HENT:  Positive for congestion. Negative for sinus pain and sore throat.   Respiratory:  Positive for cough and sputum production. Negative for shortness of breath and wheezing.   Cardiovascular:  Negative for chest pain and palpitations.  Gastrointestinal:  Negative for constipation, diarrhea, nausea and vomiting.  Musculoskeletal:  Negative for myalgias.  Neurological:  Positive for headaches. Negative for weakness.      Assessment & Plan:   See Encounters Tab for problem based charting.  Patient discussed with Dr. Deretha [facility] Internal Medicine Resident

## 2021-07-22 NOTE — Telephone Encounter (Signed)
Call to patient states still has the cough.  Patient also still has the congestion on the left side of head.  Informed that she will be receiving a TeleHealth call from 1 of the Clinics doctors this afternoon to discuss her symptoms.  TeleHealth as we realize transportation is a problem for her.  Patient agreed and will await phone call from physician this afternoon at 3:15 PM.

## 2021-07-22 NOTE — Assessment & Plan Note (Addendum)
Patient states that 6 days ago she woke up with a scratchy throat.  She then developed a productive cough of clear sputum.  Also notes associated nasal congestion and dull headache.  Over-the-counter Tylenol relieved her headache.  And an old prescription of guaifenesin-codeine medicine provided moderate relief.  She denies any sick contacts and has not been any large crowds.  She denies fever, chills, chest pain, shortness of breath, myalgias, N/V, diarrhea or weakness.  Overall, she states she is feels fine and able to get around her home normally.  Her symptoms have improved with conservative management including over-the-counter Tylenol and previously prescribed cough medicine.  P: -Continue supportive care -Prescription for guaifenesin-codeine sent -Patient advised to call the office if her symptoms do not improve or worsen over the next several days (Monday, Jul 26, 2021)

## 2021-07-26 NOTE — Addendum Note (Signed)
Addended by: Lalla Brothers T on: 07/26/2021 09:34 AM   Modules accepted: Level of Service

## 2021-07-26 NOTE — Progress Notes (Signed)
Internal Medicine Clinic Attending  Case discussed with Dr. Jeanice Lim  At the time of the visit.  We reviewed the resident's history and pertinent patient test results.  I agree with the assessment, diagnosis, and plan of care documented in the resident's note.

## 2021-07-28 ENCOUNTER — Ambulatory Visit: Payer: Medicare HMO | Admitting: Primary Care

## 2021-07-28 NOTE — Progress Notes (Unsigned)
$'@Patient'S$  ID: [name], female    DOB: Jul 08, 1935, 86 y.o.   MRN: [id number]  No chief complaint on file.   Referring provider: Aldine Contes, MD  HPI: 86 year old female, former light smoker quit in September 23, 1958. 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  Patient of Dr. Halford Chessman, last seen in office on 09/01/2020 for OSA. Also follows with Dr. Chase Caller for ILD.   Previous [facility] pulmonary encounter: 09/01/20- Dr. Berdie Ogren She is followed by Dr. Chase Caller.  Saw Wyn Quaker.  Had sleep study done.  She has been experiencing snoring, apnea, sleep disruption, and daytime sleepiness.  Sleep study showed mild sleep apnea.  Her brother and her son both use CPAP.  Has nocturnal oxygen set up through [facility], but hasn't been using on consistent basis  12/16/2020- [name]/OSA Patient presents today for follow-up.  She is looking to get portable oxygen concentrator for daytime use. During her last visit in June with Dr. Halford Chessman she was arranged to start auto CPAP. Patient continues to experience dyspnea with exertion, this is not new. She has no new or acute respiratory symptoms. She qualified for daytime oxygen today. She is maintained on Breo 276mg daily and prn albuterol hfa for asthma. She is wearing 2L oxygen at night. She has not yet been set up with CPAP, ONO is on hold until she can get set up on PAP therapy.   06/10/2021 -  Dr. RChase Caller Chief Complaint  Patient presents with   Follow-up    Pt states she has been doing okay since last visit and denies any real complaints.   Multifactorial dyspnea from morbid obesity associated sleep apnea, paralyzed hemidiaphragm, ejection systolic murmur.  And grade 1 diastolic dysfunction in November 2021 echo.  Abnormal CT scan of the chest October 2021 with possible early ILD.  Possible asthma  on inhaler therapy with Breo  HPI [name] -returns for follow-up.  I last saw her in October 2021 at which time initiated the dyspnea work-up.  It appears since then based on review of the chart she is on CPAP nightly with Dr. SHalford Chessman  She also tells me she is on oxygen x1 year for night and with exertion 2 L but not at rest.  Overall she feels she is better and stable.  She is able to walk longer but she attributes this to oxygen.  Without the oxygen her dyspnea is worse.  In the interim there are no emergency room visits or urgent care visits.  She states no new medical problems.  The only medication change in the last 6 months is that her iron supplement has been removed.  Recent lab 05/26/2021: Reviewed CBC normal  She was sitting there on 2 L nasal cannula.  I turned the oxygen off and 10 minutes later when we rechecked her pulse ox was 92% on room air at rest 06/10/21   07/28/2021- Interim hx  Patient presents today for regular OV.    ILD - PFTs - doing today  - HRCT- Not done yet, scheduled for June 8th   Systolic murmur  - Echocardiogram- Not done yet, scheduled for May 30th   OSA: - Continue CPAP and O2 at night        Allergies  Allergen Reactions   Acyclovir And Related    Metoprolol Itching   Food Rash and Other (See Comments)  Pt states that she is allergic to pickles.      Immunization History  Administered Date(s) Administered   Fluad Quad(high Dose 65+) 12/27/2019   H1N1 03/26/2008   Influenza Split 11/25/2010, 01/19/2012   Influenza Whole 02/09/2006, 12/10/2007, 04/16/2009, 01/07/2010   Influenza, High Dose Seasonal PF 05/06/2021   Influenza,inj,Quad PF,6+ Mos 02/15/2013, 12/09/2014, 01/12/2016, 11/22/2016, 11/14/2017   PFIZER(Purple Top)SARS-COV-2 Vaccination 04/13/2019, 05/04/2019   Pneumococcal Conjugate-13 09/04/2014   Pneumococcal Polysaccharide-23 09/10/2010   Tdap 11/25/2010, 05/26/2021    Past Medical History:  Diagnosis Date    Allergic rhinitis, cause unspecified    Asthma    Cancer ([place])    Chronic back pain    Coronary artery calcification seen on CAT scan 04/21/2017   Enlarged pulmonary artery ([place]) 04/21/2017   By chest CT   Frequent urination 11/14/2017   GERD (gastroesophageal reflux disease) 05/19/2011   history of Bilateral leg edema-likely amlodipine induced. 07/28/2011   history of CAP (community acquired pneumonia) 07/14/2011   history of HYPOTHYROIDISM, BORDERLINE 03/20/2006   Annotation: Asymptomatic, untreated Qualifier: Diagnosis of  By: Tomasa Hosteller MD, [name].    Hx of breast cancer    Hyperlipidemia    Hypertension    Hypothyroidism    Lumbago    Murmur, cardiac 11/22/2016   OBESITY NOS 03/20/2006   Qualifier: Diagnosis of  By: Tomasa Hosteller MD, Veronique D.    OSTEOARTHRITIS 12/13/2005   Annotation: bilateral knees;right knee injection 6/05 Qualifier: Diagnosis of  By: Tomasa Hosteller MD, Veronique D.    right shoulder pain, likely Impingement syndrome  09/09/2010   VENOUS INSUFFICIENCY, CHRONIC 03/20/2006   Qualifier: Diagnosis of  By: Tomasa Hosteller MD, [name].     Tobacco History: Social History   Tobacco Use  Smoking Status Former   Packs/day: 0.25   Years: 0.50   Pack years: 0.13   Types: Cigarettes   Quit date: 09/09/1958   Years since quitting: 62.9  Smokeless Tobacco Never  Tobacco Comments   social smoker   Counseling given: Not Answered Tobacco comments: social smoker   Outpatient Medications Prior to Visit  Medication Sig Dispense Refill   albuterol (VENTOLIN HFA) 108 (90 Base) MCG/ACT inhaler INHALE 2 PUFFS INTO THE LUNGS EVERY 6 (SIX) HOURS AS NEEDED FOR WHEEZING OR SHORTNESS OF BREATH. 1 each 3   acetaminophen-codeine (TYLENOL #3) 300-30 MG tablet Take 1 tablet by mouth every 4 (four) hours as needed for moderate pain. 30 tablet 0   amLODipine (NORVASC) 10 MG tablet TAKE 1 TABLET EVERY DAY 90 tablet 3   aspirin EC 81 MG tablet Take 81 mg by mouth daily.     atorvastatin (LIPITOR)  40 MG tablet Take 1 tablet (40 mg total) by mouth daily. 90 tablet 3   calcium citrate-vitamin D (CITRACAL+D) 315-200 MG-UNIT tablet Take 1 tablet by mouth 2 (two) times daily. 120 tablet 3   carvedilol (COREG) 25 MG tablet TAKE 1 TABLET BY MOUTH TWICE DAILY WITH A MEAL 180 tablet 3   Cyanocobalamin (VITAMIN B12 PO) Take 1 tablet daily by mouth.     diclofenac Sodium (VOLTAREN) 1 % GEL APPLY 2 GRAMS TOPICALLY 4 TIMES DAILY 100 g 0   fluticasone furoate-vilanterol (BREO ELLIPTA) 200-25 MCG/ACT AEPB INHALE 1 PUFF INTO THE LUNGS DAILY. 180 each 1   guaiFENesin-codeine 100-10 MG/5ML syrup Take 5 mLs by mouth every 6 (six) hours as needed for cough. 120 mL 0   levothyroxine (SYNTHROID) 25 MCG tablet Take 0.5 tablets (12.5 mcg total) by mouth daily before breakfast.  45 tablet 3   losartan (COZAAR) 100 MG tablet Take 1 tablet (100 mg total) by mouth daily. 90 tablet 3   pantoprazole (PROTONIX) 40 MG tablet Take 1 tablet (40 mg total) by mouth daily. 90 tablet 3   No facility-administered medications prior to visit.      Review of Systems  Review of Systems   Physical Exam  There were no vitals taken for this visit. Physical Exam   Lab Results:  CBC    Component Value Date/Time   WBC 8.0 05/26/2021 1218   WBC 7.1 01/01/2020 1144   RBC 4.68 05/26/2021 1218   RBC 4.28 01/01/2020 1144   HGB 13.2 05/26/2021 1218   HCT 39.4 05/26/2021 1218   PLT 212 05/26/2021 1218   MCV 84 05/26/2021 1218   MCH 28.2 05/26/2021 1218   MCH 26.9 12/01/2018 1655   MCHC 33.5 05/26/2021 1218   MCHC 32.4 01/01/2020 1144   RDW 13.5 05/26/2021 1218   LYMPHSABS 1.6 05/26/2021 1218   MONOABS 0.7 01/01/2020 1144   EOSABS 0.4 05/26/2021 1218   BASOSABS 0.1 05/26/2021 1218    BMET    Component Value Date/Time   NA 138 05/26/2021 1218   K 4.3 05/26/2021 1218   CL 98 05/26/2021 1218   CO2 26 05/26/2021 1218   GLUCOSE 98 05/26/2021 1218   GLUCOSE 127 (H) 12/01/2018 1655   BUN 17 05/26/2021 1218    CREATININE 0.66 05/26/2021 1218   CREATININE 0.91 09/23/2014 1415   CALCIUM 10.1 05/26/2021 1218   GFRNONAA 57 (L) 12/27/2019 1028   GFRNONAA 61 09/23/2014 1415   GFRAA 66 12/27/2019 1028   GFRAA 71 09/23/2014 1415    BNP No results found for: BNP  ProBNP No results found for: PROBNP  Imaging: No results found.   Assessment & Plan:   No problem-specific Assessment & Plan notes found for this encounter.     Martyn Ehrich, NP 07/28/2021

## 2021-08-03 ENCOUNTER — Ambulatory Visit (HOSPITAL_COMMUNITY)
Admission: RE | Admit: 2021-08-03 | Discharge: 2021-08-03 | Disposition: A | Discharge status: Home / Self Care | Payer: Medicare HMO | Source: Ambulatory Visit | Attending: Internal Medicine | Admitting: Internal Medicine

## 2021-08-03 DIAGNOSIS — R0600 Dyspnea, unspecified: Secondary | ICD-10-CM | POA: Diagnosis not present

## 2021-08-03 DIAGNOSIS — R0609 Other forms of dyspnea: Secondary | ICD-10-CM | POA: Diagnosis not present

## 2021-08-03 DIAGNOSIS — I3481 Nonrheumatic mitral (valve) annulus calcification: Secondary | ICD-10-CM | POA: Insufficient documentation

## 2021-08-03 DIAGNOSIS — I10 Essential (primary) hypertension: Secondary | ICD-10-CM | POA: Diagnosis not present

## 2021-08-03 DIAGNOSIS — R011 Cardiac murmur, unspecified: Secondary | ICD-10-CM | POA: Insufficient documentation

## 2021-08-03 DIAGNOSIS — R6889 Other general symptoms and signs: Secondary | ICD-10-CM | POA: Diagnosis not present

## 2021-08-03 DIAGNOSIS — I700 Atherosclerosis of aorta: Secondary | ICD-10-CM | POA: Diagnosis not present

## 2021-08-03 DIAGNOSIS — E785 Hyperlipidemia, unspecified: Secondary | ICD-10-CM | POA: Insufficient documentation

## 2021-08-03 LAB — ECHOCARDIOGRAM COMPLETE
AR max vel: 1.1 cm2
AV Area VTI: 1.09 cm2
AV Area mean vel: 1.11 cm2
AV Mean grad: 12.3 mmHg
AV Peak grad: 21.9 mmHg
Ao pk vel: 2.34 m/s
Area-P 1/2: 2.66 cm2
Calc EF: 66.2 %
S' Lateral: 1.9 cm
Single Plane A2C EF: 64.2 %
Single Plane A4C EF: 71.1 %

## 2021-08-06 ENCOUNTER — Other Ambulatory Visit: Payer: Self-pay

## 2021-08-06 DIAGNOSIS — M5441 Lumbago with sciatica, right side: Secondary | ICD-10-CM

## 2021-08-06 MED ORDER — DICLOFENAC SODIUM 1 % EX GEL: CUTANEOUS | 0 refills | Status: DC

## 2021-08-06 NOTE — Telephone Encounter (Signed)
The following medication records are no longer available for ordering. Place a new order with a different medication record. acetaminophen-codeine (TYLENOL #3) 300-30 MG tablet

## 2021-08-06 NOTE — Telephone Encounter (Signed)
acetaminophen-codeine (TYLENOL #3) 300-30 MG tablet  diclofenac Sodium (VOLTAREN) 1 % GEL, REFILL REQUEST @ [facility] [phone] - [place], [place] - [address] AT [place].

## 2021-08-09 MED ORDER — ACETAMINOPHEN-CODEINE 300-30 MG PO TABS: 1.0000 | ORAL_TABLET | ORAL | 0 refills | Status: DC | PRN

## 2021-08-09 NOTE — Addendum Note (Signed)
Addended by: Aldine Contes on: 08/09/2021 09:27 AM   Modules accepted: Orders

## 2021-08-09 NOTE — Telephone Encounter (Signed)
Also requesting refill on acetaminophen-codeine (TYLENOL #3) 300-30 MG tablet . Thanks

## 2021-08-12 ENCOUNTER — Other Ambulatory Visit: Payer: Medicare HMO

## 2021-08-23 ENCOUNTER — Encounter: Payer: Self-pay | Admitting: Internal Medicine

## 2021-08-24 ENCOUNTER — Ambulatory Visit (INDEPENDENT_AMBULATORY_CARE_PROVIDER_SITE_OTHER): Payer: Medicare HMO | Admitting: Internal Medicine

## 2021-08-24 ENCOUNTER — Encounter: Payer: Self-pay | Admitting: Internal Medicine

## 2021-08-24 VITALS — BP 130/72 | HR 75 | Temp 98.1°F | Ht 62.0 in | Wt 231.0 lb

## 2021-08-24 DIAGNOSIS — J984 Other disorders of lung: Secondary | ICD-10-CM | POA: Diagnosis not present

## 2021-08-24 DIAGNOSIS — R6889 Other general symptoms and signs: Secondary | ICD-10-CM | POA: Diagnosis not present

## 2021-08-24 DIAGNOSIS — R7303 Prediabetes: Secondary | ICD-10-CM

## 2021-08-24 DIAGNOSIS — M25511 Pain in right shoulder: Secondary | ICD-10-CM

## 2021-08-24 DIAGNOSIS — E7800 Pure hypercholesterolemia, unspecified: Secondary | ICD-10-CM

## 2021-08-24 DIAGNOSIS — Z6841 Body Mass Index (BMI) 40.0 and over, adult: Secondary | ICD-10-CM

## 2021-08-24 DIAGNOSIS — R0609 Other forms of dyspnea: Secondary | ICD-10-CM

## 2021-08-24 DIAGNOSIS — I288 Other diseases of pulmonary vessels: Secondary | ICD-10-CM

## 2021-08-24 DIAGNOSIS — Z0000 Encounter for general adult medical examination without abnormal findings: Secondary | ICD-10-CM

## 2021-08-24 DIAGNOSIS — J9611 Chronic respiratory failure with hypoxia: Secondary | ICD-10-CM

## 2021-08-24 DIAGNOSIS — Z87891 Personal history of nicotine dependence: Secondary | ICD-10-CM

## 2021-08-24 DIAGNOSIS — I350 Nonrheumatic aortic (valve) stenosis: Secondary | ICD-10-CM

## 2021-08-24 DIAGNOSIS — I10 Essential (primary) hypertension: Secondary | ICD-10-CM

## 2021-08-24 DIAGNOSIS — I2510 Atherosclerotic heart disease of native coronary artery without angina pectoris: Secondary | ICD-10-CM | POA: Diagnosis not present

## 2021-08-24 DIAGNOSIS — M5441 Lumbago with sciatica, right side: Secondary | ICD-10-CM | POA: Diagnosis not present

## 2021-08-24 DIAGNOSIS — R0600 Dyspnea, unspecified: Secondary | ICD-10-CM

## 2021-08-24 DIAGNOSIS — J069 Acute upper respiratory infection, unspecified: Secondary | ICD-10-CM

## 2021-08-24 DIAGNOSIS — J4530 Mild persistent asthma, uncomplicated: Secondary | ICD-10-CM | POA: Diagnosis not present

## 2021-08-24 DIAGNOSIS — G4734 Idiopathic sleep related nonobstructive alveolar hypoventilation: Secondary | ICD-10-CM

## 2021-08-24 LAB — POCT GLYCOSYLATED HEMOGLOBIN (HGB A1C): Hemoglobin A1C: 5.6 % (ref 4.0–5.6)

## 2021-08-24 LAB — GLUCOSE, CAPILLARY: Glucose-Capillary: 106 mg/dL — ABNORMAL HIGH (ref 70–99)

## 2021-08-24 MED ORDER — ACETAMINOPHEN-CODEINE 300-30 MG PO TABS: 1.0000 | ORAL_TABLET | ORAL | 0 refills | Status: DC | PRN

## 2021-08-24 NOTE — Assessment & Plan Note (Signed)
-   This problem is chronic and stable -Patient did complain of increased urination at night -Repeat A1c was done today which was within normal limits at 5.6 -No further work-up at this time

## 2021-08-24 NOTE — Assessment & Plan Note (Signed)
-   This problem is chronic and improving -Patient down approximately 19 pounds from last year -Patient encouraged to continue with her diet and exercise -No further work-up at this time

## 2021-08-24 NOTE — Assessment & Plan Note (Signed)
BP Readings from Last 3 Encounters:  08/24/21 130/72  06/10/21 128/70  05/26/21 (!) 146/84    Lab Results  Component Value Date   NA 138 05/26/2021   K 4.3 05/26/2021   CREATININE 0.66 05/26/2021    Assessment: Blood pressure control:  Well-controlled Progress toward BP goal:   At goal Comments: Patient is compliant with losartan 100 mg daily, carvedilol 25 mg daily and amlodipine 10 mg daily  Plan: Medications:  continue current medications Educational resources provided:   Self management tools provided:   Other plans: We will check BMP at next visit

## 2021-08-24 NOTE — Assessment & Plan Note (Signed)
-   This problem is chronic and stable -Patient does complain of dyspnea on exertion likely secondary to her underlying ILD and restrictive lung disease -However, patient was noted to have calcifications in her coronary arteries on CT of her chest and it is possible that she may have underlying cardiac disease that is contributing to her dyspnea on exertion -Referral placed to cardiology today for possible stress test -No further work-up at this time

## 2021-08-24 NOTE — Assessment & Plan Note (Signed)
-   This problem is chronic and stable -Patient was noted to have coronary calcifications and 3 vessels noted on a CT of her chest on 2021 -She also complains of dyspnea on exertion most likely secondary to her underlying lung issues but given her coronary calcifications I am concerned for possible underlying cardiac etiology that may be contributing to her symptoms -We will refer patient to cardiology for further evaluation and possible stress test -We will continue aspirin 81 mg daily and Lipitor 40 mg daily -No further work-up at this time

## 2021-08-24 NOTE — Assessment & Plan Note (Signed)
-   This problem is chronic and stable -Patient back pain is well controlled on Tylenol 3 which she takes as needed -I have put in a refill of her Tylenol 3 today -This medication allows her to do her daily activities and currently the benefits of the medication outweigh the risks -No further work-up at this time

## 2021-08-24 NOTE — Assessment & Plan Note (Signed)
-   Patient was noted to have a systolic murmur on her physical exam at her last visit and was put in for 2D echo -She was noted to have moderate AS on her echo -We will continue to monitor patient closely.  Patient to follow-up with cardiology -Referral placed today

## 2021-08-24 NOTE — Assessment & Plan Note (Signed)
-   Patient was seen in May for an upper respiratory tract infection -She states that she still has intermittent episodes of cough but her other symptoms appear to have resolved -No further work-up at this time

## 2021-08-24 NOTE — Assessment & Plan Note (Signed)
-   This problem is chronic and stable -Patient states that she has not had any episodes of wheezing and her lungs are clear to auscultation -Patient is compliant with her current medications including Breo Ellipta 1 puff daily as well as albuterol as needed -Patient states she does not need to use her albuterol inhaler much -No further work-up at this time

## 2021-08-24 NOTE — Assessment & Plan Note (Signed)
-   This problem is chronic and stable -Patient had a lipid panel done at her last visit which showed an LDL of 65 and HDL of 47 -We will continue Lipitor 40 mg daily for now -No further work-up at this time

## 2021-08-24 NOTE — Progress Notes (Signed)
   Subjective:    Patient ID: [name], female    DOB: 1936-02-06, 86 y.o.   MRN: [id number]  Shoulder Pain     I have seen and examined this patient.  Patient is here for routine follow-up of her hypertension and asthma.  Patient states that she feels well and is compliant with her medications.  She does complain of right shoulder pain which has been worsening and she is unable to lift her arm straight up.  Patient also complains of intermittent cough.  She states that her respiratory tract infection has improved though since her last visit.  Patient denies any other complaints at this time.  Review of Systems  Constitutional: Negative.   HENT: Negative.    Respiratory:  Positive for shortness of breath.        Patient complains of dyspnea on exertion  Cardiovascular: Negative.   Gastrointestinal: Negative.   Musculoskeletal:  Positive for arthralgias.       Patient complains of persistent right shoulder pain for which she takes Tylenol 3 and rubs Voltaren gel but has difficulty raising her arm overhead  Neurological: Negative.   Psychiatric/Behavioral: Negative.         Objective:   Physical Exam Constitutional:      Appearance: Normal appearance.  HENT:     Head: Normocephalic and atraumatic.  Cardiovascular:     Rate and Rhythm: Normal rate and regular rhythm.     Heart sounds: Murmur heard.     Comments: Grade 3 out of 6 mid systolic murmur best heard in the right second intercostal space Pulmonary:     Effort: Pulmonary effort is normal. No respiratory distress.     Breath sounds: Normal breath sounds. No wheezing.  Abdominal:     General: Bowel sounds are normal. There is no distension.     Palpations: Abdomen is soft.     Tenderness: There is no abdominal tenderness.  Musculoskeletal:        General: No swelling or tenderness.  Neurological:     General: No focal deficit present.     Mental Status: She is alert and oriented to person, place, and time.   Psychiatric:        Mood and Affect: Mood normal.        Behavior: Behavior normal.           Assessment & Plan:   Please see problem based charting for assessment and plan:

## 2021-08-24 NOTE — Assessment & Plan Note (Signed)
-   Patient will obtain shingles vaccine from her pharmacy -No further work-up for now

## 2021-08-24 NOTE — Assessment & Plan Note (Signed)
-   This problem is chronic and stable -Patient's hypoxic spray failure is likely secondary to obesity/restrictive lung disease as well as mild ILD -Patient continues to use O2 at night as well as with exertion but states that overall her oxygen requirements appear to have been decreasing and currently her O2 sats are in the 90s on room air -Patient was put in for repeat CT chest but has not been scheduled for this.  We will follow-up to see if this has been scheduled -Patient also follow-up with pulmonology after the CT has been done -No further work-up at this time

## 2021-08-24 NOTE — Assessment & Plan Note (Signed)
-   Patient complains of persistent right shoulder pain -States that the pain is mainly over the anterior aspect of her right shoulder  -Patient has been taking Tylenol 3 for this as well as using Voltaren gel with some relief of her symptoms -On exam, patient is unable to raise her right hand overhead secondary to pain -Even on passive motion she is unable to get her arm above her shoulder -We will refer patient to sports medicine for further evaluation.  Patient may have underlying rotator cuff injury causing her symptoms -No further work-up at this time

## 2021-08-24 NOTE — Assessment & Plan Note (Signed)
-   This problem is chronic and stable -Patient was noted to have persistently enlarged pulmonary artery on her CT chest -Patient had repeat 2D echo that was done which did not show any evidence of right heart failure but did show evidence of mildly elevated pulmonary arterial systolic pressure -No further work-up at this time -Patient to follow-up with cardiology.  Referral placed today

## 2021-08-24 NOTE — Patient Instructions (Signed)
-  It was a pleasure seeing you today -Your blood pressure is well controlled.  Keep up the great work! -For your right shoulder pain I will refer you to the sports medicine physician.  You may need an injection for that right shoulder to help with the pain -I have also put in referral to the heart doctor.  Given your shortness of breath with exertion and some blockages seen on your CT I want to ensure that your heart is doing well.  They may want to do a stress test on you -I have put in a refill for the pain medication -We will check an A1c today -I will have Ms. Mamie check with her pharmacy about the shingles vaccine -Please call me if you have any questions or concerns or if you need any refills

## 2021-08-24 NOTE — Assessment & Plan Note (Signed)
-   This problem is chronic and stable -Patient states that she needs to use her oxygen only at night and occasionally when she is exerting herself but states that overall she feels well -Her O2 sats today were 96% on room air -No further work-up at this time

## 2021-08-24 NOTE — Assessment & Plan Note (Signed)
-   This problem is chronic and stable -Patient follows up with pulmonology for this -Patient is trying to improve her activity level and continues to lose weight.  Patient is down 19 pounds from last year -She will follow-up for a sleep study as well -No further work-up at this time

## 2021-09-01 ENCOUNTER — Ambulatory Visit: Payer: Medicare HMO | Admitting: Sports Medicine

## 2021-09-01 NOTE — Progress Notes (Unsigned)
Cardiology Office Note:    Date:  09/02/2021   ID:  [name], DOB [birth year], MRN [id number]  PCP:  Aldine Contes, MD  Cardiologist:  None  Electrophysiologist:  None   Referring MD: Aldine Contes, MD   Chief Complaint  Patient presents with   Aortic Stenosis    History of Present Illness:    [name] is a 86 y.o. female with a hx of asthma, breast cancer, hypertension, hyperlipidemia, obesity who is referred by Dr. Dareen Piano for evaluation of aortic stenosis.  She denies any chest pain, lightheadedness, syncope, or palpitations.  Does report she gets short of breath with minimal exertion.  Does improve with inhaler use.  Reports swelling in her feet, uses compression stockings.  Smoked in her 9s.  Family history includes both her daughters had strokes.  Echocardiogram 08/03/2021 showed EF 65 to 70%, normal RV function, moderate aortic stenosis.    Past Medical History:  Diagnosis Date   Allergic rhinitis, cause unspecified    Asthma    Cancer ([place])    Chronic back pain    Coronary artery calcification seen on CAT scan 04/21/2017   Enlarged pulmonary artery ([place]) 04/21/2017   By chest CT   Frequent urination 11/14/2017   GERD (gastroesophageal reflux disease) 05/19/2011   history of Bilateral leg edema-likely amlodipine induced. 07/28/2011   history of CAP (community acquired pneumonia) 07/14/2011   history of HYPOTHYROIDISM, BORDERLINE 03/20/2006   Annotation: Asymptomatic, untreated Qualifier: Diagnosis of  By: Tomasa Hosteller MD, Edmon Crape.    Hx of breast cancer    Hyperlipidemia    Hypertension    Hypothyroidism    Lumbago    Murmur, cardiac 11/22/2016   OBESITY NOS 03/20/2006   Qualifier: Diagnosis of  By: Tomasa Hosteller MD, Veronique D.    OSTEOARTHRITIS 12/13/2005   Annotation: bilateral knees;right knee injection 6/05 Qualifier: Diagnosis of  By: Tomasa Hosteller MD, Edmon Crape.    Pain, dental 09/18/2020   Preventative health care 09/04/2014   Pulmonary nodule 05/30/2017    4 mm right middle lobe nodule noted on high-resolution CT done on February 06, 2017   right shoulder pain, likely Impingement syndrome  09/09/2010   Upper respiratory infection 04/09/2018   VENOUS INSUFFICIENCY, CHRONIC 03/20/2006   Qualifier: Diagnosis of  By: Tomasa Hosteller MD, Edmon Crape.     Past Surgical History:  Procedure Laterality Date   ABDOMINAL HYSTERECTOMY     BREAST LUMPECTOMY     COLONOSCOPY     HERNIA REPAIR     HIP CLOSED REDUCTION Right 11/26/2014   Procedure: CLOSED REDUCTIION RIGHT HIP;  Surgeon: Newt Minion, MD;  Location: [facility];  Service: Orthopedics;  Laterality: Right;   INCISIONAL HERNIA REPAIR N/A 10/29/2013   Procedure: OPEN REPAIR OF RECURRENT INCISIONAL HERNIA WITH MESH;  Surgeon: Zenovia Jarred, MD;  Location: [facility];  Service: General;  Laterality: N/A;   JOINT REPLACEMENT     TOTAL HIP ARTHROPLASTY      Current Medications: Current Meds  Medication Sig   acetaminophen-codeine (TYLENOL #3) 300-30 MG tablet Take 1 tablet by mouth every 4 (four) hours as needed for moderate pain.   albuterol (VENTOLIN HFA) 108 (90 Base) MCG/ACT inhaler INHALE 2 PUFFS INTO THE LUNGS EVERY 6 (SIX) HOURS AS NEEDED FOR WHEEZING OR SHORTNESS OF BREATH.   amLODipine (NORVASC) 10 MG tablet TAKE 1 TABLET EVERY DAY   aspirin EC 81 MG tablet Take 81 mg by mouth daily.   atorvastatin (LIPITOR) 40 MG tablet Take  1 tablet (40 mg total) by mouth daily.   brimonidine (ALPHAGAN) 0.2 % ophthalmic solution Place 1 drop into the left eye daily.   calcium citrate-vitamin D (CITRACAL+D) 315-200 MG-UNIT tablet Take 1 tablet by mouth 2 (two) times daily.   carvedilol (COREG) 25 MG tablet TAKE 1 TABLET BY MOUTH TWICE DAILY WITH A MEAL   Cyanocobalamin (VITAMIN B12 PO) Take 1 tablet daily by mouth.   diclofenac Sodium (VOLTAREN) 1 % GEL APPLY 2 GRAMS TOPICALLY 4 TIMES DAILY   dorzolamide-timolol (COSOPT) 22.3-6.8 MG/ML ophthalmic solution Place 1 drop into the left eye daily.   fluticasone  furoate-vilanterol (BREO ELLIPTA) 200-25 MCG/ACT AEPB INHALE 1 PUFF INTO THE LUNGS DAILY.   latanoprost (XALATAN) 0.005 % ophthalmic solution Place 1 drop into the left eye daily.   levothyroxine (SYNTHROID) 25 MCG tablet Take 0.5 tablets (12.5 mcg total) by mouth daily before breakfast.   losartan (COZAAR) 100 MG tablet Take 1 tablet (100 mg total) by mouth daily.   pantoprazole (PROTONIX) 40 MG tablet Take 1 tablet (40 mg total) by mouth daily.     Allergies:   Acyclovir and related, Metoprolol, and Food   Social History   Socioeconomic History   Marital status: Legally Separated    Spouse name: Not on file   Number of children: Not on file   Years of education: Not on file   Highest education level: Not on file  Occupational History   Occupation: not working  Tobacco Use   Smoking status: Former    Packs/day: 0.25    Years: 0.50    Total pack years: 0.13    Types: Cigarettes    Quit date: 09/09/1958    Years since quitting: 63.0   Smokeless tobacco: Never   Tobacco comments:    social smoker  Vaping Use   Vaping Use: Never used  Substance and Sexual Activity   Alcohol use: No    Alcohol/week: 0.0 standard drinks of alcohol   Drug use: No   Sexual activity: Not Currently  Other Topics Concern   Not on file  Social History Narrative   Lives with daughter.    Social Determinants of Health   Financial Resource Strain: Low Risk  (03/31/2018)   Overall Financial Resource Strain (CARDIA)    Difficulty of Paying Living Expenses: Not very hard  Food Insecurity: Unknown (03/31/2018)   Hunger Vital Sign    Worried About Running Out of Food in the Last Year: Patient refused    [place] in the Last Year: Patient refused  Transportation Needs: Unknown (03/31/2018)   [place] - Transportation    Lack of Transportation (Medical): Patient refused    Lack of Transportation (Non-Medical): Patient refused  Physical Activity: Unknown (03/31/2018)   Exercise Vital Sign    Days  of Exercise per Week: Patient refused    Minutes of Exercise per Session: Patient refused  Stress: Stress Concern Present (03/31/2018)   [place]    Feeling of Stress : To some extent  Social Connections: Unknown (03/31/2018)   Social Connection and Isolation Panel [NHANES]    Frequency of Communication with Friends and Family: Patient refused    Frequency of Social Gatherings with Friends and Family: Patient refused    Attends Religious Services: Patient refused    Active Member of Clubs or Organizations: Patient refused    Attends Archivist Meetings: Patient refused    Marital Status: Patient refused  Family History: The patient's family history includes Alzheimer's disease in her mother; Diabetes in her sister; Hypertension in her mother. There is no history of Colon cancer, Colon polyps, Esophageal cancer, Stomach cancer, or Rectal cancer.  ROS:   Please see the history of present illness.     All other systems reviewed and are negative.  EKGs/Labs/Other Studies Reviewed:    The following studies were reviewed today:   EKG:    Recent Labs: 05/26/2021: BUN 17; Creatinine, Ser 0.66; Hemoglobin 13.2; Platelets 212; Potassium 4.3; Sodium 138; TSH 2.980  Recent Lipid Panel    Component Value Date/Time   CHOL 142 05/26/2021 1218   TRIG 57 05/26/2021 1218   HDL 65 05/26/2021 1218   CHOLHDL 2.2 05/26/2021 1218   CHOLHDL 2.5 09/04/2014 1037   VLDL 12 09/04/2014 1037   LDLCALC 65 05/26/2021 1218    Physical Exam:    VS:  BP 135/85 (BP Location: Left Arm, Patient Position: Sitting, Cuff Size: Large)   Pulse 75   Ht '5\' 2"'$  (1.575 m)   Wt 231 lb 1.6 oz (104.8 kg)   SpO2 96%   BMI 42.27 kg/m     Wt Readings from Last 3 Encounters:  09/02/21 231 lb 1.6 oz (104.8 kg)  08/24/21 231 lb (104.8 kg)  06/10/21 232 lb 9.6 oz (105.5 kg)     GEN:  Well nourished, well developed in no acute  distress HEENT: Normal NECK: No JVD; No carotid bruits LYMPHATICS: No lymphadenopathy CARDIAC: RRR, 2 out of 6 systolic murmur RESPIRATORY:  Clear to auscultation without rales, wheezing or rhonchi  ABDOMEN: Soft, non-tender, non-distended MUSCULOSKELETAL:  No edema; No deformity  SKIN: Warm and dry NEUROLOGIC:  Alert and oriented x 3 PSYCHIATRIC:  Normal affect   ASSESSMENT:    1. Aortic valve stenosis, etiology of cardiac valve disease unspecified   2. Shortness of breath   3. Essential hypertension   4. Hyperlipidemia, unspecified hyperlipidemia type    PLAN:    Dyspnea: Reporting dyspnea with minimal exertion.  Likely multifactorial with asthma and deconditioning contributing.  However does have significant CAD risk factors (age, hypertension, hyperlipidemia).  Recommend stress PET to rule out ischemia  Aortic stenosis: Echocardiogram 08/03/2021 showed EF 65 to 70%, normal RV function, moderate aortic stenosis. -Plan repeat echocardiogram in 1 year to follow  Hypertension: On amlodipine 10 mg daily and Coreg 25 mg twice daily and losartan 100 mg daily.  Appears controlled  Hyperlipidemia: On atorvastatin 40 mg daily.  LDL 65 on 05/26/2021  RTC in 6 months   Shared Decision Making/Informed Consent The risks [chest pain, shortness of breath, cardiac arrhythmias, dizziness, blood pressure fluctuations, myocardial infarction, stroke/transient ischemic attack, nausea, vomiting, allergic reaction, radiation exposure, metallic taste sensation and life-threatening complications (estimated to be 1 in 10,000)], benefits (risk stratification, diagnosing coronary artery disease, treatment guidance) and alternatives of a cardiac PET stress test were discussed in detail with [name] and she agrees to proceed.    Medication Adjustments/Labs and Tests Ordered: Current medicines are reviewed at length with the patient today.  Concerns regarding medicines are outlined above.  Orders Placed  This Encounter  Procedures   NM PET CT CARDIAC PERFUSION MULTI W/ABSOLUTE BLOODFLOW   ECHOCARDIOGRAM COMPLETE   No orders of the defined types were placed in this encounter.   Patient Instructions  Medication Instructions:  Your physician recommends that you continue on your current medications as directed. Please refer to the Current Medication list given to you today.  *  If you need a refill on your cardiac medications before your next appointment, please call your pharmacy*  Testing/Procedures: Your physician has requested that you have an echocardiogram in 12 months. Echocardiography is a painless test that uses sound waves to create images of your heart. It provides your doctor with information about the size and shape of your heart and how well your heart's chambers and valves are working. This procedure takes approximately one hour. There are no restrictions for this procedure.  CARDIAC PET- Your physician has requested that you have a Cardiac Pet Stress Test. This testing is completed at [facility] ([place], [place] [address]). The schedulers will call you to get this scheduled. Please follow instructions below and call the office with any questions/concerns [phone]).  Follow-Up: At [facility], you and your health needs are our priority.  As part of our continuing mission to provide you with exceptional heart care, we have created designated Provider Care Teams.  These Care Teams include your primary Cardiologist (physician) and Advanced Practice Providers (APPs -  Physician Assistants and Nurse Practitioners) who all work together to provide you with the care you need, when you need it.  We recommend signing up for the patient portal called "MyChart".  Sign up information is provided on this After Visit Summary.  MyChart is used to connect with patients for Virtual Visits (Telemedicine).  Patients are able to view lab/test results,  encounter notes, upcoming appointments, etc.  Non-urgent messages can be sent to your provider as well.   To learn more about what you can do with MyChart, go to [email or web address].    Your next appointment:   6 month(s)  The format for your next appointment:   In Person  Provider:   Dr. Gardiner Rhyme  Other Instructions How to Prepare for Your Cardiac PET/CT Stress Test:  1. Please do not take these medications before your test:   Medications that may interfere with the cardiac pharmacological stress agent (ex. nitrates - including erectile dysfunction medications or beta-blockers) the day of the exam. (Erectile dysfunction medication should be held for at least 72 hrs prior to test) Theophylline containing medications for 12 hours. Dipyridamole 48 hours prior to the test. Your remaining medications may be taken with water.  2. Nothing to eat or drink, except water, 3 hours prior to arrival time.   NO caffeine/decaffeinated products, or chocolate 12 hours prior to arrival.  3. NO perfume, cologne or lotion  4. Total time is 1 to 2 hours; you may want to bring reading material for the waiting time.  5. Please report to Admitting at the [facility] Main Entrance 60 minutes early for your test.  [place], [place] [address]  Diabetic Preparation:  Hold oral medications. You may take NPH and Lantus insulin. Do not take Humalog or Humulin R (Regular Insulin) the day of your test. Check blood sugars prior to leaving the house. If able to eat breakfast prior to 3 hour fasting, you may take all medications, including your insulin, Do not worry if you miss your breakfast dose of insulin - start at your next meal.  IF YOU THINK YOU MAY BE PREGNANT, OR ARE NURSING PLEASE INFORM THE TECHNOLOGIST.  In preparation for your appointment, medication and supplies will be purchased.  Appointment availability is limited, so if you need to cancel or reschedule, please  call the Radiology Department at [phone]  24 hours in advance to avoid a  cancellation fee of $100.00  What to Expect After you Arrive:  Once you arrive and check in for your appointment, you will be taken to a preparation room within the Radiology Department.  A technologist or Nurse will obtain your medical history, verify that you are correctly prepped for the exam, and explain the procedure.  Afterwards,  an IV will be started in your arm and electrodes will be placed on your skin for EKG monitoring during the stress portion of the exam. Then you will be escorted to the PET/CT scanner.  There, staff will get you positioned on the scanner and obtain a blood pressure and EKG.  During the exam, you will continue to be connected to the EKG and blood pressure machines.  A small, safe amount of a radioactive tracer will be injected in your IV to obtain a series of pictures of your heart along with an injection of a stress agent.    After your Exam:  It is recommended that you eat a meal and drink a caffeinated beverage to counter act any effects of the stress agent.  Drink plenty of fluids for the remainder of the day and urinate frequently for the first couple of hours after the exam.  Your doctor will inform you of your test results within 7-10 business days.  For questions about your test or how to prepare for your test, please call: Marchia Bond, Cardiac Imaging Nurse Navigator  Gordy Clement, Cardiac Imaging Nurse Navigator Office: [phone]         Signed, Donato Heinz, MD  09/02/2021 6:18 PM    [place]

## 2021-09-02 ENCOUNTER — Ambulatory Visit (INDEPENDENT_AMBULATORY_CARE_PROVIDER_SITE_OTHER): Payer: Medicare HMO | Admitting: Cardiology

## 2021-09-02 ENCOUNTER — Encounter (HOSPITAL_BASED_OUTPATIENT_CLINIC_OR_DEPARTMENT_OTHER): Payer: Self-pay | Admitting: Cardiology

## 2021-09-02 VITALS — BP 135/85 | HR 75 | Ht 62.0 in | Wt 231.1 lb

## 2021-09-02 DIAGNOSIS — I10 Essential (primary) hypertension: Secondary | ICD-10-CM

## 2021-09-02 DIAGNOSIS — R6889 Other general symptoms and signs: Secondary | ICD-10-CM | POA: Diagnosis not present

## 2021-09-02 DIAGNOSIS — R0602 Shortness of breath: Secondary | ICD-10-CM | POA: Diagnosis not present

## 2021-09-02 DIAGNOSIS — E785 Hyperlipidemia, unspecified: Secondary | ICD-10-CM | POA: Diagnosis not present

## 2021-09-02 DIAGNOSIS — I350 Nonrheumatic aortic (valve) stenosis: Secondary | ICD-10-CM

## 2021-09-02 NOTE — Patient Instructions (Signed)
Medication Instructions:  Your physician recommends that you continue on your current medications as directed. Please refer to the Current Medication list given to you today.  *If you need a refill on your cardiac medications before your next appointment, please call your pharmacy*  Testing/Procedures: Your physician has requested that you have an echocardiogram in 12 months. Echocardiography is a painless test that uses sound waves to create images of your heart. It provides your doctor with information about the size and shape of your heart and how well your heart's chambers and valves are working. This procedure takes approximately one hour. There are no restrictions for this procedure.  CARDIAC PET- Your physician has requested that you have a Cardiac Pet Stress Test. This testing is completed at [facility] ([place], [place] [address]). The schedulers will call you to get this scheduled. Please follow instructions below and call the office with any questions/concerns [phone]).  Follow-Up: At [facility], you and your health needs are our priority.  As part of our continuing mission to provide you with exceptional heart care, we have created designated Provider Care Teams.  These Care Teams include your primary Cardiologist (physician) and Advanced Practice Providers (APPs -  Physician Assistants and Nurse Practitioners) who all work together to provide you with the care you need, when you need it.  We recommend signing up for the patient portal called "MyChart".  Sign up information is provided on this After Visit Summary.  MyChart is used to connect with patients for Virtual Visits (Telemedicine).  Patients are able to view lab/test results, encounter notes, upcoming appointments, etc.  Non-urgent messages can be sent to your provider as well.   To learn more about what you can do with MyChart, go to [email or web address].    Your next  appointment:   6 month(s)  The format for your next appointment:   In Person  Provider:   Dr. Gardiner Rhyme  Other Instructions How to Prepare for Your Cardiac PET/CT Stress Test:  1. Please do not take these medications before your test:   Medications that may interfere with the cardiac pharmacological stress agent (ex. nitrates - including erectile dysfunction medications or beta-blockers) the day of the exam. (Erectile dysfunction medication should be held for at least 72 hrs prior to test) Theophylline containing medications for 12 hours. Dipyridamole 48 hours prior to the test. Your remaining medications may be taken with water.  2. Nothing to eat or drink, except water, 3 hours prior to arrival time.   NO caffeine/decaffeinated products, or chocolate 12 hours prior to arrival.  3. NO perfume, cologne or lotion  4. Total time is 1 to 2 hours; you may want to bring reading material for the waiting time.  5. Please report to Admitting at the [facility] Main Entrance 60 minutes early for your test.  [place], [place] [address]  Diabetic Preparation:  Hold oral medications. You may take NPH and Lantus insulin. Do not take Humalog or Humulin R (Regular Insulin) the day of your test. Check blood sugars prior to leaving the house. If able to eat breakfast prior to 3 hour fasting, you may take all medications, including your insulin, Do not worry if you miss your breakfast dose of insulin - start at your next meal.  IF YOU THINK YOU MAY BE PREGNANT, OR ARE NURSING PLEASE INFORM THE TECHNOLOGIST.  In preparation for your appointment, medication and supplies will be purchased.  Appointment  availability is limited, so if you need to cancel or reschedule, please call the Radiology Department at [phone]  24 hours in advance to avoid a cancellation fee of $100.00  What to Expect After you Arrive:  Once you arrive and check in for your appointment, you  will be taken to a preparation room within the Radiology Department.  A technologist or Nurse will obtain your medical history, verify that you are correctly prepped for the exam, and explain the procedure.  Afterwards,  an IV will be started in your arm and electrodes will be placed on your skin for EKG monitoring during the stress portion of the exam. Then you will be escorted to the PET/CT scanner.  There, staff will get you positioned on the scanner and obtain a blood pressure and EKG.  During the exam, you will continue to be connected to the EKG and blood pressure machines.  A small, safe amount of a radioactive tracer will be injected in your IV to obtain a series of pictures of your heart along with an injection of a stress agent.    After your Exam:  It is recommended that you eat a meal and drink a caffeinated beverage to counter act any effects of the stress agent.  Drink plenty of fluids for the remainder of the day and urinate frequently for the first couple of hours after the exam.  Your doctor will inform you of your test results within 7-10 business days.  For questions about your test or how to prepare for your test, please call: Marchia Bond, Cardiac Imaging Nurse Navigator  Gordy Clement, Cardiac Imaging Nurse Navigator Office: [phone]

## 2021-09-03 ENCOUNTER — Other Ambulatory Visit: Payer: Medicare HMO

## 2021-09-09 ENCOUNTER — Ambulatory Visit: Payer: Medicare HMO | Admitting: Sports Medicine

## 2021-09-15 ENCOUNTER — Ambulatory Visit (INDEPENDENT_AMBULATORY_CARE_PROVIDER_SITE_OTHER): Payer: Medicare HMO | Admitting: Sports Medicine

## 2021-09-15 VITALS — BP 146/75 | Ht 62.0 in | Wt 231.0 lb

## 2021-09-15 DIAGNOSIS — M542 Cervicalgia: Secondary | ICD-10-CM | POA: Diagnosis not present

## 2021-09-15 DIAGNOSIS — R6889 Other general symptoms and signs: Secondary | ICD-10-CM | POA: Diagnosis not present

## 2021-09-15 DIAGNOSIS — M5412 Radiculopathy, cervical region: Secondary | ICD-10-CM | POA: Diagnosis not present

## 2021-09-15 DIAGNOSIS — M5441 Lumbago with sciatica, right side: Secondary | ICD-10-CM | POA: Diagnosis not present

## 2021-09-15 MED ORDER — ACETAMINOPHEN-CODEINE 300-30 MG PO TABS: 1.0000 | ORAL_TABLET | ORAL | 0 refills | Status: DC | PRN

## 2021-09-15 NOTE — Assessment & Plan Note (Signed)
I suspect at this time her pain source is likely arthritic in nature.  We will obtain x-rays of the cervical spine for further evaluation.  She may continue with her pain medication and topical Voltaren gel.  Order for physical therapy sent today.  Patient is unable to seek their services on a weekly basis however she is agreeable to be evaluated and perform home exercises.  We will follow-up in 4 weeks or sooner if pain worsens or fails to improve.   If above interventions fail to improve her symptoms we may consider MRI for further evaluation, possible facet injections for pain relief.

## 2021-09-15 NOTE — Progress Notes (Signed)
   Established Patient Office Visit  Subjective   Patient ID: [name], female    DOB: 08/05/1935  Age: 86 y.o. MRN: [id number]  Right-sided neck pain  [name] is a very pleasant 86 year old female who presents today with right-sided neck pain that has been persistently worsening over the past 3 to 4 months.  She denies any trauma or injury to the area states that her neck has been bothering her since her hip replacement greater than 6 months ago but has been worsening.  For her chronic low back pain she takes Tylenol No. 3 as needed twice daily which does seem to also help her neck pain as well as some topical Voltaren. Pain radiates down into her shoulder. She does ambulate with a right-sided cane which does exacerbate some of her symptoms.  Of note she is right-hand dominant.  She denies any numbness, tingling, or weakness of grip.  ROS: As listed above in HPI   Objective:     BP (!) 146/75   Ht '5\' 2"'$  (1.575 m)   Wt 231 lb (104.8 kg)   BMI 42.25 kg/m   Physical Exam Vitals reviewed.  Constitutional:      General: She is not in acute distress.    Appearance: She is obese. She is not diaphoretic.  Neurological:     Mental Status: She is alert.   Neck: No obvious deformity or ecchymosis. No midline cervical spine tenderness to palpation.  Tenderness to palpation of upper trapezius muscles and lateral cervical spine.  FROM flexion, extension, equivocal rotation and side bending.  Biceps, brachioradialis reflex 2/4 bilaterally, radial pulse 2+ bilaterally.  Sensation to light touch of the upper extremity intact bilaterally.    Assessment & Plan:   Problem List Items Addressed This Visit       Nervous and Auditory   Right-sided low back pain with right-sided sciatica   Relevant Medications   acetaminophen-codeine (TYLENOL #3) 300-30 MG tablet     Other   Neck pain    I suspect at this time her pain source is likely arthritic in nature.  We will obtain x-rays of the  cervical spine for further evaluation.  She may continue with her pain medication and topical Voltaren gel.  Order for physical therapy sent today.  Patient is unable to seek their services on a weekly basis however she is agreeable to be evaluated and perform home exercises.  We will follow-up in 4 weeks or sooner if pain worsens or fails to improve.   If above interventions fail to improve her symptoms we may consider MRI for further evaluation, possible facet injections for pain relief.      Other Visit Diagnoses     Cervical radiculopathy    -  Primary   Relevant Orders   DG Cervical Spine 2 or 3 views   Ambulatory referral to Physical Therapy       Return in about 4 weeks (around 10/13/2021), or sooner if symptoms worsen or fail to improve.    Elmore Guise, MD  Patient seen and evaluated with the sports medicine fellow.  I agree with the above plan of care.  Treatment and work-up as above.  Follow-up in 4 weeks for reevaluation.  Call with questions or concerns in the interim.

## 2021-09-15 NOTE — Patient Instructions (Signed)
Obtain cervical spine x-rays Continue with Tylenol 3 for pain Physical therapy visit for home exercises Return to clinic in 4 weeks for follow-up or sooner if pain worsens or fails to improve

## 2021-09-20 ENCOUNTER — Other Ambulatory Visit: Payer: Self-pay | Admitting: Internal Medicine

## 2021-09-27 ENCOUNTER — Other Ambulatory Visit: Payer: Self-pay

## 2021-09-27 MED ORDER — DICLOFENAC SODIUM 1 % EX GEL: CUTANEOUS | 0 refills | Status: DC

## 2021-09-27 NOTE — Telephone Encounter (Signed)
diclofenac Sodium (VOLTAREN) 1 % GEL, REFILL REQUEST @ [facility] [phone] - [place], [place] - [address] AT [place].

## 2021-09-28 ENCOUNTER — Other Ambulatory Visit: Payer: Self-pay

## 2021-09-28 MED ORDER — DICLOFENAC SODIUM 1 % EX GEL: CUTANEOUS | 0 refills | Status: DC

## 2021-10-01 ENCOUNTER — Ambulatory Visit: Payer: Medicare HMO | Admitting: Physical Therapy

## 2021-10-04 ENCOUNTER — Other Ambulatory Visit: Payer: Medicare HMO

## 2021-10-06 ENCOUNTER — Other Ambulatory Visit: Payer: Self-pay

## 2021-10-06 ENCOUNTER — Ambulatory Visit: Payer: Medicare HMO | Attending: Sports Medicine

## 2021-10-06 DIAGNOSIS — R293 Abnormal posture: Secondary | ICD-10-CM

## 2021-10-06 DIAGNOSIS — R6889 Other general symptoms and signs: Secondary | ICD-10-CM | POA: Diagnosis not present

## 2021-10-06 DIAGNOSIS — M6281 Muscle weakness (generalized): Secondary | ICD-10-CM

## 2021-10-06 DIAGNOSIS — M5412 Radiculopathy, cervical region: Secondary | ICD-10-CM | POA: Diagnosis not present

## 2021-10-06 DIAGNOSIS — R252 Cramp and spasm: Secondary | ICD-10-CM | POA: Diagnosis not present

## 2021-10-06 NOTE — Therapy (Addendum)
OUTPATIENT PHYSICAL THERAPY CERVICAL EVALUATION/DC   Patient Name: [name] MRN: [id number] DOB:27-Dec-1935, 86 y.o., female Today's Date: 10/07/2021   PT End of Session - 10/07/21 0557     Visit Number 1    Number of Visits 7    Date for PT Re-Evaluation 11/26/21    Authorization Type [employer] MEDICARE HMO    PT Start Time 1150    PT Stop Time 1238    PT Time Calculation (min) 48 min    Activity Tolerance Patient tolerated treatment well    Behavior During Therapy [facility] for tasks assessed/performed             Past Medical History:  Diagnosis Date   Allergic rhinitis, cause unspecified    Asthma    Cancer (HCC)    Chronic back pain    Coronary artery calcification seen on CAT scan 04/21/2017   Enlarged pulmonary artery (HCC) 04/21/2017   By chest CT   Frequent urination 11/14/2017   GERD (gastroesophageal reflux disease) 05/19/2011   history of Bilateral leg edema-likely amlodipine induced. 07/28/2011   history of CAP (community acquired pneumonia) 07/14/2011   history of HYPOTHYROIDISM, BORDERLINE 03/20/2006   Annotation: Asymptomatic, untreated Qualifier: Diagnosis of  By: Candis Musa MD, Ruben Reason.    Hx of breast cancer    Hyperlipidemia    Hypertension    Hypothyroidism    Lumbago    Murmur, cardiac 11/22/2016   OBESITY NOS 03/20/2006   Qualifier: Diagnosis of  By: Candis Musa MD, Veronique D.    OSTEOARTHRITIS 12/13/2005   Annotation: bilateral knees;right knee injection 6/05 Qualifier: Diagnosis of  By: Candis Musa MD, Ruben Reason.    Pain, dental 09/18/2020   Preventative health care 09/04/2014   Pulmonary nodule 05/30/2017   4 mm right middle lobe nodule noted on high-resolution CT done on February 06, 2017   right shoulder pain, likely Impingement syndrome  09/09/2010   Upper respiratory infection 04/09/2018   VENOUS INSUFFICIENCY, CHRONIC 03/20/2006   Qualifier: Diagnosis of  By: Candis Musa MD, Ruben Reason.    Past Surgical History:  Procedure Laterality Date   ABDOMINAL  HYSTERECTOMY     BREAST LUMPECTOMY     COLONOSCOPY     HERNIA REPAIR     HIP CLOSED REDUCTION Right 11/26/2014   Procedure: CLOSED REDUCTIION RIGHT HIP;  Surgeon: Nadara Mustard, MD;  Location: [facility] OR;  Service: Orthopedics;  Laterality: Right;   INCISIONAL HERNIA REPAIR N/A 10/29/2013   Procedure: OPEN REPAIR OF RECURRENT INCISIONAL HERNIA WITH MESH;  Surgeon: Liz Malady, MD;  Location: [facility] OR;  Service: General;  Laterality: N/A;   JOINT REPLACEMENT     TOTAL HIP ARTHROPLASTY     Patient Active Problem List   Diagnosis Date Noted   Neck pain 09/15/2021   Right shoulder pain 08/24/2021   Moderate aortic stenosis 08/24/2021   Cough 04/09/2021   Chronic respiratory failure with hypoxia (HCC) 12/16/2020   OSA (obstructive sleep apnea) 12/16/2020   Asthma 12/16/2020   Pruritus 10/21/2020   Elevated hemidiaphragm 03/10/2020   Dyspnea on exertion 03/10/2020   Healthcare maintenance 03/10/2020   Nocturnal hypoxia 03/10/2020   Anemia 04/09/2018   Coronary artery calcification seen on CAT scan 04/21/2017   Enlarged pulmonary artery (HCC) 04/21/2017   Restrictive lung disease 11/22/2016   Borderline diabetes 09/19/2013   Hypokalemia 06/27/2011   GERD (gastroesophageal reflux disease) 05/19/2011   Hyperlipidemia 05/02/2007   ALLERGIC RHINITIS 09/06/2006   Hypothyroidism 03/20/2006   Severe obesity (BMI >=  40) (HCC) 03/20/2006   Chronic venous insufficiency 03/20/2006   Right-sided low back pain with right-sided sciatica 03/20/2006   BREAST CANCER, HX OF 03/20/2006   Essential hypertension 12/13/2005   Osteoarthritis 12/13/2005    PCP: Earl Lagos, MD  REFERRING PROVIDER: Ralene Cork, DO  REFERRING DIAG: [phone] (ICD-10-CM) - Cervical radiculopathy  THERAPY DIAG:  Radiculopathy, cervical region  Abnormal posture  Muscle weakness (generalized)  Cramp and spasm  Rationale for Evaluation and Treatment Rehabilitation  ONSET DATE: For about a year    SUBJECTIVE:                                                                                                                                                                                                         SUBJECTIVE STATEMENT: Pt reports intermittent pain in the R shoulder and neck which is sometimes runs into the lateral GH jt area. Denies N/T of the R arm.   PERTINENT HISTORY:  Obese, mammary hypertrophy  PAIN:  Are you having pain? Yes: NPRS scale: 5/10 Pain location: Intermittent R shoulder and neck which is sometimes into the lateral GH jt area Pain description: throb Aggravating factors: Cold weather, raising the R arm Relieving factors: Exercising my neck and R arm  0-8/10   PRECAUTIONS: None  WEIGHT BEARING RESTRICTIONS No  FALLS:  Has patient fallen in last 6 months? No  LIVING ENVIRONMENT: Lives with: lives with their family Lives in: House/apartment No issue with accessing or mobility within home  OCCUPATION: Retired  PLOF: Independent  PATIENT GOALS Less pain and lift R arm more easily  OBJECTIVE:   DIAGNOSTIC FINDINGS:  Xay scheduled  PATIENT SURVEYS:  FOTO TBA- not generated    COGNITION: Overall cognitive status: Within functional limits for tasks assessed   SENSATION: WFL  POSTURE: rounded shoulders, forward head, increased lumbar lordosis, decreased lumbar lordosis, increased thoracic kyphosis, and flexed trunk  significant dowager's hump  PALPATION: TTP with increased muscle tightness of the r upper shoulder musculature   CERVICAL ROM:   Active ROM A/PROM (deg) eval  Flexion 48 tightness  Extension 40 tightness  Right lateral flexion 20 R  pressure pain  Left lateral flexion 30 L pressure pain  Right rotation 50 tightness  Left rotation 40 tightness   (Blank rows = not tested)  UPPER EXTREMITY ROM:  Active ROM Right eval Left eval  Shoulder flexion 90 able to be raised higher with assist 120  Shoulder extension     Shoulder abduction    Shoulder adduction    Shoulder extension  Shoulder internal rotation    Shoulder external rotation    Elbow flexion    Elbow extension    Wrist flexion    Wrist extension    Wrist ulnar deviation    Wrist radial deviation    Wrist pronation    Wrist supination     (Blank rows = not tested)  UPPER EXTREMITY MMT: With MMT in neutral 4+for R shoulder MMT Right eval Left eval  Shoulder flexion    Shoulder extension    Shoulder abduction    Shoulder adduction    Shoulder extension    Shoulder internal rotation    Shoulder external rotation    Middle trapezius    Lower trapezius    Elbow flexion    Elbow extension    Wrist flexion    Wrist extension    Wrist ulnar deviation    Wrist radial deviation    Wrist pronation    Wrist supination    Grip strength     (Blank rows = not tested)  CERVICAL SPECIAL TESTS:  Spurling's test: Negative   SHOULDER SPECIAL TESTS: Hawakins-Kennedy, Full can, empty can - Neg  FUNCTIONAL TESTS:  NT   TODAY'S TREATMENT:  [facility] Adult PT Treatment:                                                DATE: 10/06/21 Therapeutic Exercise: - Seated Passive Cervical Retraction  10 reps - 3 hold - Shoulder Rolls in Sitting  10 reps - Seated Upper Trap Stretch  2 reps - 15 hold - Seated Cervical Rotation AROM  2 reps - 15 hold  PATIENT EDUCATION:  Education details: Eval findings, POC, HEP Person educated: Patient Education method: Explanation, Demonstration, Tactile cues, Verbal cues, and Handouts Education comprehension: verbalized understanding, returned demonstration, verbal cues required, and tactile cues required   HOME EXERCISE PROGRAM: Access Code: [email or web address] URL: [email or web address] Date: 10/07/2021 Prepared by: Joellyn Rued  Exercises - Seated Passive Cervical Retraction  - 3 x daily - 7 x weekly - 1 sets - 10 reps - 3 hold - Shoulder Rolls in Sitting  - 3 x daily - 7 x weekly - 1 sets - 10  reps - Seated Upper Trap Stretch  - 3 x daily - 7 x weekly - 1 sets - 3 reps - 15 hold - Seated Cervical Rotation AROM  - 3 x daily - 7 x weekly - 1 sets - 3 reps - 15 hold  ASSESSMENT:  CLINICAL IMPRESSION: Patient is a 86 y.o. female who was seen today for physical therapy evaluation and treatment for cervical radiculopathy. Pt has poor posture with significant upper shoulder muscle tightness and pain. R shouldr flexion is limited c AROM for flexion, appearing related to pain, strength was good and shoulder test for impingement and rotator cuff issues were negative/   OBJECTIVE IMPAIRMENTS decreased ROM, decreased strength, increased fascial restrictions, impaired perceived functional ability, impaired UE functional use, postural dysfunction, and obesity.   ACTIVITY LIMITATIONS carrying, lifting, and sitting  PARTICIPATION LIMITATIONS: meal prep, cleaning, and laundry  PERSONAL FACTORS Age, Fitness, Past/current experiences, Time since onset of injury/illness/exacerbation, and 1-2 comorbidities: obesity, mammary hypertropy are also affecting patient's functional outcome.   REHAB POTENTIAL: Good  CLINICAL DECISION MAKING: Stable/uncomplicated  EVALUATION COMPLEXITY: Low   GOALS:  SHORT TERM GOALS: Target date: 10/28/2021   Pt  will be Ind in a HEP Baseline: started on eval Goal status: INITIAL  2.  Pt will voice understanding of measures to assist with decreasing pain Baseline: To be provided Goal status: INITIAL  LONG TERM GOALS: 11/26/21  Improve cerivcal ROM by5d or more for decreased cervical strain and pain Baseline: see flow sheets Goal status: INITIAL  2.  Pt will be Ind in a HEP and completed regularly to decrease cervical strain and pain Baseline: Started on eval Goal status: INITIAL  3.  Pt's R shoulder AROM will increase to 110d for improved above shoulder reach and function Baseline: 90d Goal status: INITIAL  4.  Pt will report a decrease in R shoulder and  cervical pain to 0-5/10 or less with daily activities Baseline: 0-8/10 Goal status: INITIAL  5.  FOTO score will improve to predicated values as indication of improved function Baseline: TBA Goal status: INITIAL  6.  Pt will be Ind in a final HEP to maintain achieved LOF and QOL Baseline: Started on eval Goal status: INITIAL   PLAN: PT FREQUENCY: 1x/week  PT DURATION: 6 weeks  PLANNED INTERVENTIONS: Therapeutic exercises, Therapeutic activity, Patient/Family education, Self Care, Joint mobilization, Aquatic Therapy, Dry Needling, Electrical stimulation, Spinal manipulation, Spinal mobilization, Cryotherapy, Moist heat, Taping, Vasopneumatic device, Ultrasound, Ionotophoresis 4mg /ml Dexamethasone, Manual therapy, and Re-evaluation  PLAN FOR NEXT SESSION: Generate and complete FOTO; Review HEP and response; use of modalities, manual therapy, and TPDN as indicated.   Marimar Suber MS, PT 10/07/21 7:50 AM   Referring diagnosis? REFERRING DIAG: M54.12 (ICD-10-CM) - Cervical radiculopathy  Treatment diagnosis? (if different than referring diagnosis) Radiculopathy, cervical region, Abnormal posture, Muscle weakness (generalized), Cramp and spasm What was this (referring dx) caused by? []  Surgery []  Fall [x]  Ongoing issue [x]  Arthritis []  Other: ____________  Laterality: [x]  Rt []  Lt []  Both  Check all possible CPT codes:  *CHOOSE 10 OR LESS*    [x]  97110 (Therapeutic Exercise)  []  92507 (SLP Treatment)  []  97112 (Neuro Re-ed)   []  92526 (Swallowing Treatment)   [x]  97116 (Gait Training)   []  [id number] (Cognitive Training, 1st 15 minutes) [x]  97140 (Manual Therapy)   []  97130 (Cognitive Training, each add'l 15 minutes)  [x]  97164 (Re-evaluation)                              []  Other, List CPT Code ____________  [x]  97530 (Therapeutic Activities)     [x]  97535 (Self Care)   []  All codes above (97110 - 97535)  [x]  97012 (Mechanical Traction)  [x]  97014 (E-stim Unattended)  [x]   97032 (E-stim manual)  [x]  47829 (Ionto)  [x]  97035 (Ultrasound) []  97750 (Physical Performance Training) [x]  [id number] (Aquatic Therapy) [x]  97016 (Vasopneumatic Device) []  [id number] (Paraffin) []  97034 (Contrast Bath) []  97597 (Wound Care 1st 20 sq cm) []  97598 (Wound Care each add'l 20 sq cm) []  97760 (Orthotic Fabrication, Fitting, Training Initial) []  [id number] (Prosthetic Management and Training Initial) []  [id number] (Orthotic or Prosthetic Training/ Modification Subsequent)  PHYSICAL THERAPY DISCHARGE SUMMARY  Visits from Start of Care: 1  Current functional level related to goals / functional outcomes: unknown   Remaining deficits: unknown   Education / Equipment: HEP   Patient agrees to discharge. Patient goals were not met. Patient is being discharged due to not returning since the last visit.   Lizvet Chunn MS, PT 04/20/23 4:14 PM

## 2021-10-07 ENCOUNTER — Other Ambulatory Visit: Payer: Self-pay | Admitting: Internal Medicine

## 2021-10-07 DIAGNOSIS — M5441 Lumbago with sciatica, right side: Secondary | ICD-10-CM

## 2021-10-12 ENCOUNTER — Ambulatory Visit: Payer: Medicare HMO | Admitting: Sports Medicine

## 2021-10-14 DIAGNOSIS — H4052X3 Glaucoma secondary to other eye disorders, left eye, severe stage: Secondary | ICD-10-CM | POA: Diagnosis not present

## 2021-10-14 DIAGNOSIS — H25811 Combined forms of age-related cataract, right eye: Secondary | ICD-10-CM | POA: Diagnosis not present

## 2021-10-14 DIAGNOSIS — H34832 Tributary (branch) retinal vein occlusion, left eye, with macular edema: Secondary | ICD-10-CM | POA: Diagnosis not present

## 2021-10-14 DIAGNOSIS — H31092 Other chorioretinal scars, left eye: Secondary | ICD-10-CM | POA: Diagnosis not present

## 2021-10-14 DIAGNOSIS — Z961 Presence of intraocular lens: Secondary | ICD-10-CM | POA: Diagnosis not present

## 2021-10-14 DIAGNOSIS — H3582 Retinal ischemia: Secondary | ICD-10-CM | POA: Diagnosis not present

## 2021-10-14 DIAGNOSIS — H26492 Other secondary cataract, left eye: Secondary | ICD-10-CM | POA: Diagnosis not present

## 2021-10-14 DIAGNOSIS — R6889 Other general symptoms and signs: Secondary | ICD-10-CM | POA: Diagnosis not present

## 2021-10-20 ENCOUNTER — Other Ambulatory Visit: Payer: Self-pay | Admitting: Internal Medicine

## 2021-10-20 ENCOUNTER — Ambulatory Visit: Payer: Medicare HMO

## 2021-10-20 DIAGNOSIS — J984 Other disorders of lung: Secondary | ICD-10-CM

## 2021-10-20 NOTE — Telephone Encounter (Signed)
Next appt scheduled 9/12 with PCP. 

## 2021-10-27 ENCOUNTER — Other Ambulatory Visit: Payer: Medicare HMO

## 2021-10-28 ENCOUNTER — Encounter: Payer: Medicare HMO | Admitting: Physical Therapy

## 2021-10-29 ENCOUNTER — Other Ambulatory Visit: Payer: Self-pay | Admitting: Internal Medicine

## 2021-11-03 ENCOUNTER — Encounter: Payer: Medicare HMO | Admitting: Physical Therapy

## 2021-11-06 ENCOUNTER — Telehealth: Payer: Self-pay | Admitting: Internal Medicine

## 2021-11-06 NOTE — Telephone Encounter (Signed)
    Regarding [name] Feb 18, 1936 - seen in April 2023 with advise for do HRCT supine and prone  - do full PFT, echo  and ROV in 1 month with APP. Of these only echo was done. Plan - pls ensure HRCT and PFT and return to see APP

## 2021-11-16 ENCOUNTER — Encounter: Payer: Medicare HMO | Admitting: Internal Medicine

## 2021-11-19 ENCOUNTER — Ambulatory Visit
Admission: RE | Admit: 2021-11-19 | Discharge: 2021-11-19 | Disposition: A | Discharge status: Home / Self Care | Payer: Medicare HMO | Source: Ambulatory Visit | Attending: Internal Medicine | Admitting: Internal Medicine

## 2021-11-19 DIAGNOSIS — I2510 Atherosclerotic heart disease of native coronary artery without angina pectoris: Secondary | ICD-10-CM | POA: Diagnosis not present

## 2021-11-19 DIAGNOSIS — R918 Other nonspecific abnormal finding of lung field: Secondary | ICD-10-CM | POA: Diagnosis not present

## 2021-11-19 DIAGNOSIS — R0609 Other forms of dyspnea: Secondary | ICD-10-CM

## 2021-11-19 DIAGNOSIS — R0600 Dyspnea, unspecified: Secondary | ICD-10-CM | POA: Diagnosis not present

## 2021-11-19 DIAGNOSIS — J84112 Idiopathic pulmonary fibrosis: Secondary | ICD-10-CM | POA: Diagnosis not present

## 2021-12-06 ENCOUNTER — Other Ambulatory Visit: Payer: Self-pay | Admitting: Internal Medicine

## 2021-12-06 DIAGNOSIS — I10 Essential (primary) hypertension: Secondary | ICD-10-CM

## 2021-12-06 DIAGNOSIS — M5441 Lumbago with sciatica, right side: Secondary | ICD-10-CM

## 2021-12-21 DIAGNOSIS — H31092 Other chorioretinal scars, left eye: Secondary | ICD-10-CM | POA: Diagnosis not present

## 2021-12-21 DIAGNOSIS — H348322 Tributary (branch) retinal vein occlusion, left eye, stable: Secondary | ICD-10-CM | POA: Diagnosis not present

## 2021-12-21 DIAGNOSIS — Z961 Presence of intraocular lens: Secondary | ICD-10-CM | POA: Diagnosis not present

## 2021-12-21 DIAGNOSIS — R6889 Other general symptoms and signs: Secondary | ICD-10-CM | POA: Diagnosis not present

## 2021-12-21 DIAGNOSIS — H26492 Other secondary cataract, left eye: Secondary | ICD-10-CM | POA: Diagnosis not present

## 2021-12-21 DIAGNOSIS — H25811 Combined forms of age-related cataract, right eye: Secondary | ICD-10-CM | POA: Diagnosis not present

## 2021-12-21 DIAGNOSIS — H401123 Primary open-angle glaucoma, left eye, severe stage: Secondary | ICD-10-CM | POA: Diagnosis not present

## 2021-12-31 ENCOUNTER — Ambulatory Visit (INDEPENDENT_AMBULATORY_CARE_PROVIDER_SITE_OTHER): Payer: Medicare HMO | Admitting: Internal Medicine

## 2021-12-31 ENCOUNTER — Ambulatory Visit (INDEPENDENT_AMBULATORY_CARE_PROVIDER_SITE_OTHER): Payer: Medicare HMO | Admitting: Primary Care

## 2021-12-31 ENCOUNTER — Encounter: Payer: Self-pay | Admitting: Primary Care

## 2021-12-31 VITALS — BP 120/70 | HR 74 | Ht 62.0 in | Wt 228.0 lb

## 2021-12-31 DIAGNOSIS — J849 Interstitial pulmonary disease, unspecified: Secondary | ICD-10-CM | POA: Diagnosis not present

## 2021-12-31 DIAGNOSIS — G4733 Obstructive sleep apnea (adult) (pediatric): Secondary | ICD-10-CM | POA: Diagnosis not present

## 2021-12-31 DIAGNOSIS — R0609 Other forms of dyspnea: Secondary | ICD-10-CM | POA: Diagnosis not present

## 2021-12-31 DIAGNOSIS — R6889 Other general symptoms and signs: Secondary | ICD-10-CM | POA: Diagnosis not present

## 2021-12-31 DIAGNOSIS — J4530 Mild persistent asthma, uncomplicated: Secondary | ICD-10-CM | POA: Diagnosis not present

## 2021-12-31 LAB — PULMONARY FUNCTION TEST
DL/VA % pred: 94 %
DL/VA: 3.89 ml/min/mmHg/L
DLCO cor % pred: 52 %
DLCO cor: 9.04 ml/min/mmHg
DLCO unc % pred: 52 %
DLCO unc: 9.04 ml/min/mmHg
FEF 25-75 Post: 0.8 L/sec
FEF 25-75 Pre: 0.71 L/sec
FEF2575-%Change-Post: 12 %
FEF2575-%Pred-Post: 70 %
FEF2575-%Pred-Pre: 62 %
FEV1-%Change-Post: 0 %
FEV1-%Pred-Post: 40 %
FEV1-%Pred-Pre: 41 %
FEV1-Post: 0.67 L
FEV1-Pre: 0.68 L
FEV1FVC-%Change-Post: -4 %
FEV1FVC-%Pred-Pre: 117 %
FEV6-%Change-Post: 4 %
FEV6-%Pred-Post: 39 %
FEV6-%Pred-Pre: 37 %
FEV6-Post: 0.82 L
FEV6-Pre: 0.79 L
FEV6FVC-%Pred-Post: 106 %
FEV6FVC-%Pred-Pre: 106 %
FVC-%Change-Post: 4 %
FVC-%Pred-Post: 36 %
FVC-%Pred-Pre: 35 %
FVC-Post: 0.82 L
FVC-Pre: 0.79 L
Post FEV1/FVC ratio: 81 %
Post FEV6/FVC ratio: 100 %
Pre FEV1/FVC ratio: 86 %
Pre FEV6/FVC Ratio: 100 %
RV % pred: 60 %
RV: 1.43 L
TLC % pred: 55 %
TLC: 2.63 L

## 2021-12-31 NOTE — Progress Notes (Signed)
Full PFT performed today. °

## 2021-12-31 NOTE — Patient Instructions (Signed)
Full PFT performed today. °

## 2021-12-31 NOTE — Progress Notes (Unsigned)
$'@Patient'y$  ID: [name], female    DOB: Feb 12, 1936, 86 y.o.   MRN: [id number]  Chief Complaint  Patient presents with   Follow-up    PFT    Referring provider: Aldine Contes, MD  HPI: 86 year old female, former light smoker quit in September 23, 1958. 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  Patient of Dr. Halford [name], last seen in office on 09/01/2020 for OSA. Also follows with Dr. Chase [name] for ILD.   Previous [facility] pulmonary encounter: 09/01/20- Dr. Berdie [name] She is followed by Dr. Chase [name].  Saw [name].  Had sleep study done.  She has been experiencing snoring, apnea, sleep disruption, and daytime sleepiness.  Sleep study showed mild sleep apnea.  Her brother and her son both use CPAP.  Has nocturnal oxygen set up through [place], but hasn't been using on consistent basis  12/16/2020 Patient presents today for follow-up.  She is looking to get portable oxygen concentrator for daytime use. During her last visit in June with Dr. Halford [name] she was arranged to start auto CPAP. Patient continues to experience dyspnea with exertion, this is not new. She has no new or acute respiratory symptoms. She qualified for daytime oxygen today. She is maintained on Breo 238mg daily and prn albuterol hfa for asthma. She is wearing 2L oxygen at night. She has not yet been set up with CPAP, ONO is on hold until she can get set up on PAP therapy.   06/10/2021 -   Chief Complaint  Patient presents with   Follow-up    Pt states she has been doing okay since last visit and denies any real complaints.   Multifactorial dyspnea from morbid obesity associated sleep apnea, paralyzed hemidiaphragm, ejection systolic murmur.  And grade 1 diastolic dysfunction in November 2021 echo.  Abnormal CT scan of the chest October 2021 with possible early ILD.   Possible asthma on inhaler therapy with Breo  HPI ARoyston [email or web address].[id number]. -returns for follow-up.  I last saw her in October 2021 at which time initiated the dyspnea work-up.  It appears since then based on review of the chart she is on CPAP nightly with Dr. SHalford [name]  She also tells me she is on oxygen x1 year for night and with exertion 2 L but not at rest.  Overall she feels she is better and stable.  She is able to walk longer but she attributes this to oxygen.  Without the oxygen her dyspnea is worse.  In the interim there are no emergency room visits or urgent care visits.  She states no new medical problems.  The only medication change in the last 6 months is that her iron supplement has been removed.  Recent lab 05/26/2021: Reviewed CBC normal  She was sitting there on 2 L nasal cannula.  I turned the oxygen off and 10 minutes later when we rechecked her pulse ox was 92% on room air at rest 06/10/21  CT Chest data  No results found.    12/31/2021- Interim hx  Multifactorial dyspnea from morbid obesity associated sleep apnea, paralyzed hemidiaphragm, ejection systolic murmur.  And grade 1 diastolic dysfunction in November 2021 echo.  Abnormal CT scan of the chest October 2021 with possible early ILD.  [facility] presents today for follow-up/ PFTs.  Breathing is about the same She has dyspnea symptoms with exertion only.  She is able to do most ADLs but needs to sit to rest.   No coughing symptoms  She wears oxygen during the day on occasional and at night She is compliant with CPAP use   CT chest showed mild fibrotic lung disease, unchanged since previous imaging  Severe elevation left hemidiaphragm remains unchanged  Pulmonary function testing shows severe restriction however diffusion capacity remains stable if not some better compared to previous     SYMPTOM SCALE - ILD 01/01/2020 Last Weight  Most recent update: 01/01/2020 11:12 AM       Weight  112.9 kg (248 lb 12.8 oz)                     12/31/2021   O2 use ra RA  Shortness of Breath 0 -> 5 scale with 5 being worst (score 6 If unable to do)   At rest 4 0  Simple tasks - showers, clothes change, eating, shaving 5 3  Household (dishes, doing bed, laundry) 5 3  Shopping 5 NA  Walking level at own pace 5 4  Walking up Stairs 6 6  Total (30-36) Dyspnea Score x   How bad is your cough? Yes with wheeze 0  How bad is your fatigue yes 0  How bad is nausea 00 0  How bad is vomiting?   0 0  How bad is diarrhea? 0 0  How bad is anxiety? 0 0  How bad is depression 0 0        Allergies  Allergen Reactions   Acyclovir And Related    Metoprolol Itching   Food Rash and Other (See Comments)    Pt states that she is allergic to pickles.      Immunization History  Administered Date(s) Administered   Fluad Quad(high Dose 65+) 12/27/2019   H1N1 03/26/2008   Influenza Split 11/25/2010, 01/19/2012   Influenza Whole 02/09/2006, 12/10/2007, 04/16/2009, 01/07/2010   Influenza, High Dose Seasonal PF 05/06/2021   Influenza,inj,Quad PF,6+ Mos 02/15/2013, 12/09/2014, 01/12/2016, 11/22/2016, 11/14/2017   PFIZER(Purple Top)SARS-COV-2 Vaccination 04/13/2019, 05/04/2019   Pneumococcal Conjugate-13 09/04/2014   Pneumococcal Polysaccharide-23 09/10/2010   Tdap 11/25/2010, 05/26/2021    Past Medical History:  Diagnosis Date   Allergic rhinitis, cause unspecified    Asthma    Cancer ([place])    Chronic back pain    Coronary artery calcification seen on CAT scan 04/21/2017   Enlarged pulmonary artery ([place]) 04/21/2017   By chest CT   Frequent urination 11/14/2017   GERD (gastroesophageal reflux disease) 05/19/2011   history of Bilateral leg edema-likely amlodipine induced. 07/28/2011   history of CAP (community acquired pneumonia) 07/14/2011   history of HYPOTHYROIDISM, BORDERLINE 03/20/2006   Annotation: Asymptomatic, untreated Qualifier: Diagnosis of  By: Natalie Hosteller MD, Edmon Crape.     Hx of breast cancer    Hyperlipidemia    Hypertension    Hypothyroidism    Lumbago    Murmur, cardiac 11/22/2016   OBESITY NOS 03/20/2006   Qualifier: Diagnosis of  By: Natalie Hosteller MD, Veronique D.    OSTEOARTHRITIS 12/13/2005   Annotation: bilateral knees;right knee injection 6/05 Qualifier: Diagnosis of  By: Natalie Hosteller MD, Edmon Crape.    Pain, dental 09/18/2020   Preventative health care 09/04/2014   Pulmonary nodule 05/30/2017   4 mm right middle lobe nodule noted on high-resolution CT done on February 06, 2017   right shoulder pain, likely Impingement syndrome  09/09/2010   Upper respiratory infection 04/09/2018   VENOUS INSUFFICIENCY,  CHRONIC 03/20/2006   Qualifier: Diagnosis of  By: Natalie Hosteller MD, Edmon Crape.     Tobacco History: Social History   Tobacco Use  Smoking Status Former   Packs/day: 0.25   Years: 0.50   Total pack years: 0.13   Types: Cigarettes   Quit date: 09/09/1958   Years since quitting: 63.3  Smokeless Tobacco Never  Tobacco Comments   social smoker   Counseling given: Not Answered Tobacco comments: social smoker   Outpatient Medications Prior to Visit  Medication Sig Dispense Refill   albuterol (VENTOLIN HFA) 108 (90 Base) MCG/ACT inhaler INHALE 2 PUFFS INTO THE LUNGS EVERY 6 (SIX) HOURS AS NEEDED FOR WHEEZING OR SHORTNESS OF BREATH. 1 each 3   BREO ELLIPTA 200-25 MCG/ACT AEPB INHALE 1 PUFF INTO THE LUNGS DAILY 180 each 1   acetaminophen-codeine (TYLENOL #3) 300-30 MG tablet TAKE 1 TABLET EVERY 4 HOURS AS NEEDED FOR MODERATE PAIN. 30 tablet 0   amLODipine (NORVASC) 10 MG tablet TAKE 1 TABLET EVERY DAY 90 tablet 3   aspirin EC 81 MG tablet Take 81 mg by mouth daily.     atorvastatin (LIPITOR) 40 MG tablet Take 1 tablet (40 mg total) by mouth daily. 90 tablet 3   brimonidine (ALPHAGAN) 0.2 % ophthalmic solution Place 1 drop into the left eye daily.     calcium citrate-vitamin D (CITRACAL+D) 315-200 MG-UNIT tablet Take 1 tablet by mouth 2 (two) times daily. 120  tablet 3   carvedilol (COREG) 25 MG tablet TAKE 1 TABLET BY MOUTH TWICE DAILY WITH A MEAL 180 tablet 3   Cyanocobalamin (VITAMIN B12 PO) Take 1 tablet daily by mouth.     diclofenac Sodium (VOLTAREN) 1 % GEL APPLY 2 GRAMS TOPICALLY 4 TIMES DAILY 200 g 3   dorzolamide-timolol (COSOPT) 22.3-6.8 MG/ML ophthalmic solution Place 1 drop into the left eye daily.     latanoprost (XALATAN) 0.005 % ophthalmic solution Place 1 drop into the left eye daily.     levothyroxine (SYNTHROID) 25 MCG tablet Take 0.5 tablets (12.5 mcg total) by mouth daily before breakfast. 45 tablet 3   losartan (COZAAR) 100 MG tablet TAKE 1 TABLET (100 MG TOTAL) BY MOUTH DAILY. 90 tablet 3   pantoprazole (PROTONIX) 40 MG tablet Take 1 tablet (40 mg total) by mouth daily. 90 tablet 3   No facility-administered medications prior to visit.   Review of Systems  Review of Systems  Constitutional: Negative.   HENT: Negative.    Respiratory:  Positive for shortness of breath. Negative for cough, chest tightness and wheezing.        DOE  Cardiovascular: Negative.    Physical Exam  BP 110/60 (BP Location: Left Arm)   Pulse 74   Ht '5\' 2"'$  (1.575 m)   Wt 228 lb (103.4 kg)   SpO2 95%   BMI 41.70 kg/m  Physical Exam Constitutional:      General: She is not in acute distress.    Appearance: Normal appearance. She is obese. She is not ill-appearing.  HENT:     Head: Normocephalic and atraumatic.  Cardiovascular:     Rate and Rhythm: Normal rate.     Heart sounds: Murmur heard.  Pulmonary:     Effort: Pulmonary effort is normal.     Breath sounds: Rales present.  Neurological:     General: No focal deficit present.     Mental Status: She is alert and oriented to person, place, and time. Mental status is at baseline.  Psychiatric:  Mood and Affect: Mood normal.        Behavior: Behavior normal.        Thought Content: Thought content normal.        Judgment: Judgment normal.      Lab Results:  CBC     Component Value Date/Time   WBC 8.0 05/26/2021 1218   WBC 7.1 01/01/2020 1144   RBC 4.68 05/26/2021 1218   RBC 4.28 01/01/2020 1144   HGB 13.2 05/26/2021 1218   HCT 39.4 05/26/2021 1218   PLT 212 05/26/2021 1218   MCV 84 05/26/2021 1218   MCH 28.2 05/26/2021 1218   MCH 26.9 12/01/2018 1655   MCHC 33.5 05/26/2021 1218   MCHC 32.4 01/01/2020 1144   RDW 13.5 05/26/2021 1218   LYMPHSABS 1.6 05/26/2021 1218   MONOABS 0.7 01/01/2020 1144   EOSABS 0.4 05/26/2021 1218   BASOSABS 0.1 05/26/2021 1218    BMET    Component Value Date/Time   NA 138 05/26/2021 1218   K 4.3 05/26/2021 1218   CL 98 05/26/2021 1218   CO2 26 05/26/2021 1218   GLUCOSE 98 05/26/2021 1218   GLUCOSE 127 (H) 12/01/2018 1655   BUN 17 05/26/2021 1218   CREATININE 0.66 05/26/2021 1218   CREATININE 0.91 09/23/2014 1415   CALCIUM 10.1 05/26/2021 1218   GFRNONAA 57 (L) 12/27/2019 1028   GFRNONAA 61 09/23/2014 1415   GFRAA 66 12/27/2019 1028   GFRAA 71 09/23/2014 1415    BNP No results found for: "BNP"  ProBNP No results found for: "PROBNP"  Imaging: No results found.   Assessment & Plan:   No problem-specific Assessment & Plan notes found for this encounter.     Martyn Ehrich, NP 12/31/2021

## 2021-12-31 NOTE — Patient Instructions (Addendum)
  CT chest showed mild fibrotic lung disease which is unchanged since previous imaging  Severe elevation left hemidiaphragm remains unchanged Continue monitoring with annual CT imaging  Orders: Needs download from CPAP Walk on forehead probe  (resting HR/pulse ox and final HR/pulse ox)  Recheck BP with large cuff  HRCT in 1 year re: ILD (ordered)  Follow-up: 3 months with Dr. Chase Caller

## 2022-01-03 ENCOUNTER — Other Ambulatory Visit: Payer: Self-pay | Admitting: Internal Medicine

## 2022-01-03 DIAGNOSIS — I10 Essential (primary) hypertension: Secondary | ICD-10-CM

## 2022-01-03 DIAGNOSIS — E039 Hypothyroidism, unspecified: Secondary | ICD-10-CM

## 2022-01-03 DIAGNOSIS — K219 Gastro-esophageal reflux disease without esophagitis: Secondary | ICD-10-CM

## 2022-01-03 DIAGNOSIS — J849 Interstitial pulmonary disease, unspecified: Secondary | ICD-10-CM | POA: Insufficient documentation

## 2022-01-03 DIAGNOSIS — E7800 Pure hypercholesterolemia, unspecified: Secondary | ICD-10-CM

## 2022-01-03 NOTE — Assessment & Plan Note (Signed)
Continue CPAP nightly. °

## 2022-01-03 NOTE — Assessment & Plan Note (Signed)
-   Continue Breo 255mg one puff once daily

## 2022-01-03 NOTE — Assessment & Plan Note (Addendum)
-   Patient has mild fibrotic lung disease which appears stable on most recent CT imaging in September 2023 and pulmonary function testing.  She is minimally symptomatic.  ILD scale is baseline. Plan to continue with annual CT imaging and to monitor clinical symptoms. If there is progression of disease we will discuss starting antifibrotics, unclear she would be able to tolerate medication d.t age and side effects.

## 2022-01-10 ENCOUNTER — Other Ambulatory Visit: Payer: Self-pay

## 2022-01-10 ENCOUNTER — Other Ambulatory Visit: Payer: Self-pay | Admitting: Internal Medicine

## 2022-01-10 DIAGNOSIS — M5441 Lumbago with sciatica, right side: Secondary | ICD-10-CM

## 2022-01-10 MED ORDER — ACETAMINOPHEN-CODEINE 300-30 MG PO TABS: ORAL_TABLET | ORAL | 0 refills | Status: DC

## 2022-01-10 NOTE — Telephone Encounter (Signed)
Refill  acetaminophen-codeine (TYLENOL #3) 300-30 MG tablet   Pt would like to use the following pharmacy   [facility] ([address]), [place] (Ph: [phone])  Order Details

## 2022-01-14 ENCOUNTER — Other Ambulatory Visit: Payer: Self-pay | Admitting: Internal Medicine

## 2022-02-21 ENCOUNTER — Encounter: Payer: Self-pay | Admitting: *Deleted

## 2022-02-22 ENCOUNTER — Ambulatory Visit (INDEPENDENT_AMBULATORY_CARE_PROVIDER_SITE_OTHER): Payer: Medicare HMO | Admitting: Internal Medicine

## 2022-02-22 VITALS — BP 138/77 | HR 75 | Temp 97.7°F | Ht 62.0 in | Wt 228.4 lb

## 2022-02-22 DIAGNOSIS — Z23 Encounter for immunization: Secondary | ICD-10-CM | POA: Diagnosis not present

## 2022-02-22 DIAGNOSIS — Z87891 Personal history of nicotine dependence: Secondary | ICD-10-CM | POA: Diagnosis not present

## 2022-02-22 DIAGNOSIS — J984 Other disorders of lung: Secondary | ICD-10-CM | POA: Diagnosis not present

## 2022-02-22 DIAGNOSIS — M5441 Lumbago with sciatica, right side: Secondary | ICD-10-CM

## 2022-02-22 DIAGNOSIS — D509 Iron deficiency anemia, unspecified: Secondary | ICD-10-CM | POA: Diagnosis not present

## 2022-02-22 DIAGNOSIS — I872 Venous insufficiency (chronic) (peripheral): Secondary | ICD-10-CM

## 2022-02-22 DIAGNOSIS — I350 Nonrheumatic aortic (valve) stenosis: Secondary | ICD-10-CM | POA: Diagnosis not present

## 2022-02-22 DIAGNOSIS — J9611 Chronic respiratory failure with hypoxia: Secondary | ICD-10-CM

## 2022-02-22 DIAGNOSIS — Z0000 Encounter for general adult medical examination without abnormal findings: Secondary | ICD-10-CM

## 2022-02-22 DIAGNOSIS — Z6841 Body Mass Index (BMI) 40.0 and over, adult: Secondary | ICD-10-CM

## 2022-02-22 DIAGNOSIS — I10 Essential (primary) hypertension: Secondary | ICD-10-CM

## 2022-02-22 LAB — CBC WITH DIFFERENTIAL/PLATELET
Abs Immature Granulocytes: 0.03 10*3/uL (ref 0.00–0.07)
Basophils Absolute: 0.1 10*3/uL (ref 0.0–0.1)
Basophils Relative: 1 %
Eosinophils Absolute: 0.2 10*3/uL (ref 0.0–0.5)
Eosinophils Relative: 3 %
HCT: 44.3 % (ref 36.0–46.0)
Hemoglobin: 14 g/dL (ref 12.0–15.0)
Immature Granulocytes: 0 %
Lymphocytes Relative: 16 %
Lymphs Abs: 1.2 10*3/uL (ref 0.7–4.0)
MCH: 27 pg (ref 26.0–34.0)
MCHC: 31.6 g/dL (ref 30.0–36.0)
MCV: 85.4 fL (ref 80.0–100.0)
Monocytes Absolute: 0.7 10*3/uL (ref 0.1–1.0)
Monocytes Relative: 9 %
Neutro Abs: 5.5 10*3/uL (ref 1.7–7.7)
Neutrophils Relative %: 71 %
Platelets: UNDETERMINED 10*3/uL (ref 150–400)
RBC: 5.19 MIL/uL — ABNORMAL HIGH (ref 3.87–5.11)
RDW: 13.9 % (ref 11.5–15.5)
WBC: 7.4 10*3/uL (ref 4.0–10.5)

## 2022-02-22 MED ORDER — ACETAMINOPHEN-CODEINE 300-30 MG PO TABS: ORAL_TABLET | ORAL | 0 refills | Status: DC

## 2022-02-22 NOTE — Patient Instructions (Signed)
-  It was a pleasure seeing you today!  Have a great holiday season -We will check your iron studies and blood counts today -We will give you a flu shot today -Please get your shingles vaccine from the pharmacy -Your blood pressure is well-controlled.  Keep up the great work! -Please continue with your diet and exercise.  You have had significant weight loss and I believe that this is helping you with your breathing as well as your pain.  Keep up the great work! -Please call me with any questions or concerns or if you need any refills -Your new PCP will be Dr. Lottie Mussel.  You have an appointment to see her in February -It has been a pleasure taking care of you all this years!

## 2022-02-23 ENCOUNTER — Ambulatory Visit: Payer: Medicare HMO | Admitting: Cardiology

## 2022-02-23 LAB — IRON AND TIBC
Iron Saturation: 19 % (ref 15–55)
Iron: 56 ug/dL (ref 27–139)
Total Iron Binding Capacity: 293 ug/dL (ref 250–450)
UIBC: 237 ug/dL (ref 118–369)

## 2022-02-23 LAB — FERRITIN: Ferritin: 86 ng/mL (ref 15–150)

## 2022-02-23 NOTE — Assessment & Plan Note (Signed)
-  This problem is chronic and stable -Patient with history of chronic venous insufficiency and has intermittent swelling of her lower extremities -On exam today, patient with no lower extremity edema noted -She did have an echo which showed normal EF with grade 1 diastolic dysfunction and moderate AS but I do not believe that the moderate AS is symptomatic as she has no evidence of significant volume overload -Patient is also on amlodipine which can cause lower extremity swelling but given that the swelling usually occurs within the day if the patient has been on her feet it is likely secondary to chronic venous insufficiency as opposed amlodipine -She does have persistent swelling may consider stopping her amlodipine or lowering the dose

## 2022-02-23 NOTE — Assessment & Plan Note (Signed)
-  This problem is chronic and improving -Patient's weight is down approximately 20 pounds since last year -Patient congratulated on her weight loss and encouraged to continue with her diet and excise -No further workup at this time

## 2022-02-23 NOTE — Assessment & Plan Note (Signed)
BP Readings from Last 3 Encounters:  02/22/22 138/77  12/31/21 120/70  09/15/21 (!) 146/75    Lab Results  Component Value Date   NA 138 05/26/2021   K 4.3 05/26/2021   CREATININE 0.66 05/26/2021    Assessment: Blood pressure control:  Well-controlled Progress toward BP goal:   At goal Comments: Patient is compliant with losartan 100 mg daily, carvedilol 25 mg twice daily and amlodipine 10 mg daily  Plan: Medications:  continue current medications Educational resources provided:   Self management tools provided:   Other plans: Consider BMP at next visit

## 2022-02-23 NOTE — Assessment & Plan Note (Signed)
-  Flu shot to be given today -No further workup at this time

## 2022-02-23 NOTE — Progress Notes (Deleted)
Cardiology Office Note:    Date:  02/23/2022   ID:  [name], DOB 10-15-1935, MRN [id number]  PCP:  Lottie Mussel, MD  Cardiologist:  None  Electrophysiologist:  None   Referring MD: Aldine Contes, MD   No chief complaint on file.   History of Present Illness:    [name] is a 86 y.o. female with a hx of asthma, breast cancer, hypertension, hyperlipidemia, obesity who presents for follow-up.  She was referred by Dr. Dareen Piano for evaluation of aortic stenosis, initially seen 09/02/2021.  She denies any chest pain, lightheadedness, syncope, or palpitations.  Does report she gets short of breath with minimal exertion.  Does improve with inhaler use.  Reports swelling in her feet, uses compression stockings.  Smoked in her 85s.  Family history includes both her daughters had strokes.  Echocardiogram 08/03/2021 showed EF 65 to 70%, normal RV function, moderate aortic stenosis.    Past Medical History:  Diagnosis Date   Allergic rhinitis, cause unspecified    Asthma    Cancer ([place])    Chronic back pain    Coronary artery calcification seen on CAT scan 04/21/2017   Enlarged pulmonary artery ([place]) 04/21/2017   By chest CT   Frequent urination 11/14/2017   GERD (gastroesophageal reflux disease) 05/19/2011   history of Bilateral leg edema-likely amlodipine induced. 07/28/2011   history of CAP (community acquired pneumonia) 07/14/2011   history of HYPOTHYROIDISM, BORDERLINE 03/20/2006   Annotation: Asymptomatic, untreated Qualifier: Diagnosis of  By: Tomasa Hosteller MD, Edmon Crape.    Hx of breast cancer    Hyperlipidemia    Hypertension    Hypothyroidism    Lumbago    Murmur, cardiac 11/22/2016   OBESITY NOS 03/20/2006   Qualifier: Diagnosis of  By: Tomasa Hosteller MD, Veronique D.    OSTEOARTHRITIS 12/13/2005   Annotation: bilateral knees;right knee injection 6/05 Qualifier: Diagnosis of  By: Tomasa Hosteller MD, Edmon Crape.    Pain, dental 09/18/2020   Preventative health care 09/04/2014   Pulmonary  nodule 05/30/2017   4 mm right middle lobe nodule noted on high-resolution CT done on February 06, 2017   right shoulder pain, likely Impingement syndrome  09/09/2010   Upper respiratory infection 04/09/2018   VENOUS INSUFFICIENCY, CHRONIC 03/20/2006   Qualifier: Diagnosis of  By: Tomasa Hosteller MD, Edmon Crape.     Past Surgical History:  Procedure Laterality Date   ABDOMINAL HYSTERECTOMY     BREAST LUMPECTOMY     COLONOSCOPY     HERNIA REPAIR     HIP CLOSED REDUCTION Right 11/26/2014   Procedure: CLOSED REDUCTIION RIGHT HIP;  Surgeon: Newt Minion, MD;  Location: [facility];  Service: Orthopedics;  Laterality: Right;   INCISIONAL HERNIA REPAIR N/A 10/29/2013   Procedure: OPEN REPAIR OF RECURRENT INCISIONAL HERNIA WITH MESH;  Surgeon: Zenovia Jarred, MD;  Location: [facility];  Service: General;  Laterality: N/A;   JOINT REPLACEMENT     TOTAL HIP ARTHROPLASTY      Current Medications: No outpatient medications have been marked as taking for the 02/23/22 encounter (Appointment) with Donato Heinz, MD.     Allergies:   Acyclovir and related, Metoprolol, and Food   Social History   Socioeconomic History   Marital status: Legally Separated    Spouse name: Not on file   Number of children: Not on file   Years of education: Not on file   Highest education level: Not on file  Occupational History   Occupation: not working  Tobacco Use   Smoking status: Former    Packs/day: 0.25    Years: 0.50    Total pack years: 0.13    Types: Cigarettes    Quit date: 09/09/1958    Years since quitting: 63.5   Smokeless tobacco: Never   Tobacco comments:    social smoker  Vaping Use   Vaping Use: Never used  Substance and Sexual Activity   Alcohol use: No    Alcohol/week: 0.0 standard drinks of alcohol   Drug use: No   Sexual activity: Not Currently  Other Topics Concern   Not on file  Social History Narrative   Lives with daughter.    Social Determinants of Health   Financial Resource  Strain: Low Risk  (03/31/2018)   Overall Financial Resource Strain (CARDIA)    Difficulty of Paying Living Expenses: Not very hard  Food Insecurity: Unknown (03/31/2018)   Hunger Vital Sign    Worried About Running Out of Food in the Last Year: Patient refused    [place] in the Last Year: Patient refused  Transportation Needs: Unknown (03/31/2018)   [place] - Transportation    Lack of Transportation (Medical): Patient refused    Lack of Transportation (Non-Medical): Patient refused  Physical Activity: Unknown (03/31/2018)   Exercise Vital Sign    Days of Exercise per Week: Patient refused    Minutes of Exercise per Session: Patient refused  Stress: Stress Concern Present (03/31/2018)   [place]    Feeling of Stress : To some extent  Social Connections: Unknown (03/31/2018)   Social Connection and Isolation Panel [NHANES]    Frequency of Communication with Friends and Family: Patient refused    Frequency of Social Gatherings with Friends and Family: Patient refused    Attends Religious Services: Patient refused    Marine scientist or Organizations: Patient refused    Attends Archivist Meetings: Patient refused    Marital Status: Patient refused     Family History: The patient's family history includes Alzheimer's disease in her mother; Diabetes in her sister; Hypertension in her mother. There is no history of Colon cancer, Colon polyps, Esophageal cancer, Stomach cancer, or Rectal cancer.  ROS:   Please see the history of present illness.     All other systems reviewed and are negative.  EKGs/Labs/Other Studies Reviewed:    The following studies were reviewed today:   EKG:    Recent Labs: 05/26/2021: BUN 17; Creatinine, Ser 0.66; Potassium 4.3; Sodium 138; TSH 2.980 02/22/2022: Hemoglobin 14.0; Platelets PLATELET CLUMPS NOTED ON SMEAR, UNABLE TO ESTIMATE  Recent Lipid Panel     Component Value Date/Time   CHOL 142 05/26/2021 1218   TRIG 57 05/26/2021 1218   HDL 65 05/26/2021 1218   CHOLHDL 2.2 05/26/2021 1218   CHOLHDL 2.5 09/04/2014 1037   VLDL 12 09/04/2014 1037   LDLCALC 65 05/26/2021 1218    Physical Exam:    VS:  There were no vitals taken for this visit.    Wt Readings from Last 3 Encounters:  02/22/22 228 lb 6.4 oz (103.6 kg)  12/31/21 228 lb (103.4 kg)  09/15/21 231 lb (104.8 kg)     GEN:  Well nourished, well developed in no acute distress HEENT: Normal NECK: No JVD; No carotid bruits LYMPHATICS: No lymphadenopathy CARDIAC: RRR, 2 out of 6 systolic murmur RESPIRATORY:  Clear to auscultation without rales, wheezing or rhonchi  ABDOMEN: Soft, non-tender, non-distended  MUSCULOSKELETAL:  No edema; No deformity  SKIN: Warm and dry NEUROLOGIC:  Alert and oriented x 3 PSYCHIATRIC:  Normal affect   ASSESSMENT:    No diagnosis found.  PLAN:    Dyspnea: Reporting dyspnea with minimal exertion.  Likely multifactorial with asthma and deconditioning contributing.  However does have significant CAD risk factors (age, hypertension, hyperlipidemia).  Recommend stress PET to rule out ischemia  Aortic stenosis: Echocardiogram 08/03/2021 showed EF 65 to 70%, normal RV function, moderate aortic stenosis. -Plan repeat echocardiogram in 1 year to follow  Hypertension: On amlodipine 10 mg daily and Coreg 25 mg twice daily and losartan 100 mg daily.  Appears controlled  Hyperlipidemia: On atorvastatin 40 mg daily.  LDL 65 on 05/26/2021  RTC in 6 months   Shared Decision Making/Informed Consent The risks [chest pain, shortness of breath, cardiac arrhythmias, dizziness, blood pressure fluctuations, myocardial infarction, stroke/transient ischemic attack, nausea, vomiting, allergic reaction, radiation exposure, metallic taste sensation and life-threatening complications (estimated to be 1 in 10,000)], benefits (risk stratification, diagnosing coronary  artery disease, treatment guidance) and alternatives of a cardiac PET stress test were discussed in detail with [name] and she agrees to proceed.    Medication Adjustments/Labs and Tests Ordered: Current medicines are reviewed at length with the patient today.  Concerns regarding medicines are outlined above.  No orders of the defined types were placed in this encounter.  No orders of the defined types were placed in this encounter.   There are no Patient Instructions on file for this visit.   Signed, Donato Heinz, MD  02/23/2022 5:51 AM    [place]

## 2022-02-23 NOTE — Assessment & Plan Note (Signed)
-  Patient not have a systolic murmur on her physical exam on her visit earlier this year and had a 2D echo done which showed moderate AS -Patient will need yearly echocardiogram to continue to monitor this -Patient to follow-up with cardiology.  She did have an appointment today but did not realize it.  She states that she will reschedule this -No further workup at this time

## 2022-02-23 NOTE — Progress Notes (Signed)
   Subjective:    Patient ID: [name], female    DOB: Jan 24, 1936, 86 y.o.   MRN: [id number]  HPI  I have seen and examined this patient.  Patient is here for routine follow-up of her hypertension and ILD.  Patient states that she feels well today and denies any new complaints.  She states that her shortness of breath is slowly improving and that she is exercising and trying to lose weight.  She states that she is compliant with all her medications.  Review of Systems  Constitutional: Negative.   HENT: Negative.    Respiratory: Negative.    Cardiovascular: Negative.   Gastrointestinal: Negative.   Musculoskeletal:  Positive for arthralgias.  Neurological: Negative.   Psychiatric/Behavioral: Negative.        Objective:   Physical Exam Constitutional:      Appearance: Normal appearance.  HENT:     Head: Normocephalic and atraumatic.  Cardiovascular:     Rate and Rhythm: Normal rate and regular rhythm.     Heart sounds: Murmur heard.     Comments: Grade 3 out of 6 systolic murmur noted best heard at the right upper sternal border Pulmonary:     Breath sounds: Normal breath sounds. No wheezing or rales.  Abdominal:     General: Bowel sounds are normal. There is no distension.     Palpations: Abdomen is soft.     Tenderness: There is no abdominal tenderness.  Musculoskeletal:        General: No swelling or tenderness.  Neurological:     Mental Status: She is alert and oriented to person, place, and time.  Psychiatric:        Mood and Affect: Mood normal.        Behavior: Behavior normal.         Assessment & Plan:

## 2022-02-23 NOTE — Assessment & Plan Note (Signed)
-  This problem is now resolved -She does have iron deficiency anemia and had an EGD/colonoscopy done 2020 for this which showed no evidence of malignancy but did have a small AVM in the duodenum -She was initially on iron supplementation but given that her hemoglobin normalized this was DC'd earlier this year -We repeated her CBC and iron studies on this visit which were all within normal limits -She did have clumping of her platelets which is suspected secondary to difficulty in drawing her blood -Will repeat CBC at next visit to obtain accurate platelet count but otherwise no further workup required at this time

## 2022-02-23 NOTE — Assessment & Plan Note (Signed)
-  This problem is chronic and stable -Patient has intermittent back pain is well-controlled on Tylenol 3 which she takes as needed -I have refilled this for her today -Her PDMP does not show any red flags -This medication allows her to continue her daily activities and the benefits this medication currently outweigh its risks -No further workup at this time

## 2022-02-23 NOTE — Assessment & Plan Note (Addendum)
-  This problem is chronic and stable -It is likely secondary to multiple factors including obesity, and mild fibrotic lung disease -Patient follows up with pulmonology as an outpatient for this and saw them recently -She states that her shortness of breath and dyspnea exertion are improving and that she exercises twice a day -Encourage patient to continue walking -We will continue with Breo Ellipta, albuterol as needed -No further workup at this time

## 2022-02-25 ENCOUNTER — Other Ambulatory Visit: Payer: Self-pay | Admitting: Internal Medicine

## 2022-02-25 DIAGNOSIS — J984 Other disorders of lung: Secondary | ICD-10-CM

## 2022-04-05 ENCOUNTER — Encounter: Payer: Medicare HMO | Admitting: Internal Medicine

## 2022-04-05 NOTE — Progress Notes (Signed)
 This encounter was created in error - please disregard.

## 2022-04-05 NOTE — Patient Instructions (Signed)
ICD-10-CM   1. Dyspnea on exertion  R06.09     2. Elevated hemidiaphragm  J98.6     3. OSA (obstructive sleep apnea)  G47.33     4. Severe obesity (BMI >= 40) (HCC)  E66.01     5. Systolic murmur  V81.8       Glad overall stable x 1 year but OCt 2021 CT chest was notnormal  Plan  - do HRCT supine and prone  - do full PFT  - do echo (you said you have an appt for this but I do not see it onour systeem) - cotninue o2 at night/exretion and cPAP at night  Followup  - 1 month with APP to review results

## 2022-04-10 NOTE — Progress Notes (Deleted)
Cardiology Office Note:    Date:  04/10/2022   ID:  [name], DOB 1936/03/07, MRN [id number]  PCP:  Lottie Mussel, MD  Cardiologist:  None  Electrophysiologist:  None   Referring MD: Aldine Contes, MD   No chief complaint on file.   History of Present Illness:    [name] is a 87 y.o. female with a hx of aortic stenosis, asthma, ILD, breast cancer, hypertension, hyperlipidemia, obesity who presents for follow-up.  She was referred by Dr. Dareen Piano for evaluation of aortic stenosis, initially seen 09/02/2021.  She denies any chest pain, lightheadedness, syncope, or palpitations.  Does report she gets short of breath with minimal exertion.  Does improve with inhaler use.  Reports swelling in her feet, uses compression stockings.  Smoked in her 87s.  Family history includes both her daughters had strokes.  Echocardiogram 08/03/2021 showed EF 65 to 70%, normal RV function, moderate aortic stenosis.  At initial clinic visit 09/02/2021, stress PET was ordered but was not done.  Since last clinic visit,    Past Medical History:  Diagnosis Date   Allergic rhinitis, cause unspecified    Asthma    Cancer ([place])    Chronic back pain    Coronary artery calcification seen on CAT scan 04/21/2017   Enlarged pulmonary artery ([place]) 04/21/2017   By chest CT   Frequent urination 11/14/2017   GERD (gastroesophageal reflux disease) 05/19/2011   history of Bilateral leg edema-likely amlodipine induced. 07/28/2011   history of CAP (community acquired pneumonia) 07/14/2011   history of HYPOTHYROIDISM, BORDERLINE 03/20/2006   Annotation: Asymptomatic, untreated Qualifier: Diagnosis of  By: Tomasa Hosteller MD, Edmon Crape.    Hx of breast cancer    Hyperlipidemia    Hypertension    Hypothyroidism    Lumbago    Murmur, cardiac 11/22/2016   OBESITY NOS 03/20/2006   Qualifier: Diagnosis of  By: Tomasa Hosteller MD, Veronique D.    OSTEOARTHRITIS 12/13/2005   Annotation: bilateral knees;right knee injection 6/05  Qualifier: Diagnosis of  By: Tomasa Hosteller MD, Edmon Crape.    Pain, dental 09/18/2020   Preventative health care 09/04/2014   Pulmonary nodule 05/30/2017   4 mm right middle lobe nodule noted on high-resolution CT done on February 06, 2017   right shoulder pain, likely Impingement syndrome  09/09/2010   Upper respiratory infection 04/09/2018   VENOUS INSUFFICIENCY, CHRONIC 03/20/2006   Qualifier: Diagnosis of  By: Tomasa Hosteller MD, Edmon Crape.     Past Surgical History:  Procedure Laterality Date   ABDOMINAL HYSTERECTOMY     BREAST LUMPECTOMY     COLONOSCOPY     HERNIA REPAIR     HIP CLOSED REDUCTION Right 11/26/2014   Procedure: CLOSED REDUCTIION RIGHT HIP;  Surgeon: Newt Minion, MD;  Location: [facility];  Service: Orthopedics;  Laterality: Right;   INCISIONAL HERNIA REPAIR N/A 10/29/2013   Procedure: OPEN REPAIR OF RECURRENT INCISIONAL HERNIA WITH MESH;  Surgeon: Zenovia Jarred, MD;  Location: [facility];  Service: General;  Laterality: N/A;   JOINT REPLACEMENT     TOTAL HIP ARTHROPLASTY      Current Medications: No outpatient medications have been marked as taking for the 04/11/22 encounter (Appointment) with Donato Heinz, MD.     Allergies:   Acyclovir and related, Metoprolol, and Food   Social History   Socioeconomic History   Marital status: Legally Separated    Spouse name: Not on file   Number of children: Not on file   Years  of education: Not on file   Highest education level: Not on file  Occupational History   Occupation: not working  Tobacco Use   Smoking status: Former    Packs/day: 0.25    Years: 0.50    Total pack years: 0.13    Types: Cigarettes    Quit date: 09/09/1958    Years since quitting: 63.6   Smokeless tobacco: Never   Tobacco comments:    social smoker  Vaping Use   Vaping Use: Never used  Substance and Sexual Activity   Alcohol use: No    Alcohol/week: 0.0 standard drinks of alcohol   Drug use: No   Sexual activity: Not Currently  Other Topics  Concern   Not on file  Social History Narrative   Lives with daughter.    Social Determinants of Health   Financial Resource Strain: Low Risk  (03/31/2018)   Overall Financial Resource Strain (CARDIA)    Difficulty of Paying Living Expenses: Not very hard  Food Insecurity: Unknown (03/31/2018)   Hunger Vital Sign    Worried About Running Out of Food in the Last Year: Patient refused    [place] in the Last Year: Patient refused  Transportation Needs: Unknown (03/31/2018)   [place] - Transportation    Lack of Transportation (Medical): Patient refused    Lack of Transportation (Non-Medical): Patient refused  Physical Activity: Unknown (03/31/2018)   Exercise Vital Sign    Days of Exercise per Week: Patient refused    Minutes of Exercise per Session: Patient refused  Stress: Stress Concern Present (03/31/2018)   [place]    Feeling of Stress : To some extent  Social Connections: Unknown (03/31/2018)   Social Connection and Isolation Panel [NHANES]    Frequency of Communication with Friends and Family: Patient refused    Frequency of Social Gatherings with Friends and Family: Patient refused    Attends Religious Services: Patient refused    Marine scientist or Organizations: Patient refused    Attends Archivist Meetings: Patient refused    Marital Status: Patient refused     Family History: The patient's family history includes Alzheimer's disease in her mother; Diabetes in her sister; Hypertension in her mother. There is no history of Colon cancer, Colon polyps, Esophageal cancer, Stomach cancer, or Rectal cancer.  ROS:   Please see the history of present illness.     All other systems reviewed and are negative.  EKGs/Labs/Other Studies Reviewed:    The following studies were reviewed today:   EKG:    Recent Labs: 05/26/2021: BUN 17; Creatinine, Ser 0.66; Potassium 4.3; Sodium 138; TSH  2.980 02/22/2022: Hemoglobin 14.0; Platelets PLATELET CLUMPS NOTED ON SMEAR, UNABLE TO ESTIMATE  Recent Lipid Panel    Component Value Date/Time   CHOL 142 05/26/2021 1218   TRIG 57 05/26/2021 1218   HDL 65 05/26/2021 1218   CHOLHDL 2.2 05/26/2021 1218   CHOLHDL 2.5 09/04/2014 1037   VLDL 12 09/04/2014 1037   LDLCALC 65 05/26/2021 1218    Physical Exam:    VS:  There were no vitals taken for this visit.    Wt Readings from Last 3 Encounters:  02/22/22 228 lb 6.4 oz (103.6 kg)  12/31/21 228 lb (103.4 kg)  09/15/21 231 lb (104.8 kg)     GEN:  Well nourished, well developed in no acute distress HEENT: Normal NECK: No JVD; No carotid bruits LYMPHATICS: No lymphadenopathy CARDIAC:  RRR, 2 out of 6 systolic murmur RESPIRATORY:  Clear to auscultation without rales, wheezing or rhonchi  ABDOMEN: Soft, non-tender, non-distended MUSCULOSKELETAL:  No edema; No deformity  SKIN: Warm and dry NEUROLOGIC:  Alert and oriented x 3 PSYCHIATRIC:  Normal affect   ASSESSMENT:    No diagnosis found.  PLAN:    Dyspnea: Reporting dyspnea with minimal exertion.  Likely multifactorial with asthma and deconditioning contributing.  However does have significant CAD risk factors (age, hypertension, hyperlipidemia).  Recommend stress PET to rule out ischemia  Aortic stenosis: Echocardiogram 08/03/2021 showed EF 65 to 70%, normal RV function, moderate aortic stenosis. -Plan repeat echocardiogram in 1 year to follow  Hypertension: On amlodipine 10 mg daily and Coreg 25 mg twice daily and losartan 100 mg daily.  Appears controlled  Hyperlipidemia: On atorvastatin 40 mg daily.  LDL 65 on 05/26/2021  ILD: Mild ILD, follows with pulmonology  RTC in 6 months   Shared Decision Making/Informed Consent The risks [chest pain, shortness of breath, cardiac arrhythmias, dizziness, blood pressure fluctuations, myocardial infarction, stroke/transient ischemic attack, nausea, vomiting, allergic reaction,  radiation exposure, metallic taste sensation and life-threatening complications (estimated to be 1 in 10,000)], benefits (risk stratification, diagnosing coronary artery disease, treatment guidance) and alternatives of a cardiac PET stress test were discussed in detail with [name] and she agrees to proceed.    Medication Adjustments/Labs and Tests Ordered: Current medicines are reviewed at length with the patient today.  Concerns regarding medicines are outlined above.  No orders of the defined types were placed in this encounter.  No orders of the defined types were placed in this encounter.   There are no Patient Instructions on file for this visit.   Signed, Donato Heinz, MD  04/10/2022 4:40 PM    [facility]

## 2022-04-11 ENCOUNTER — Ambulatory Visit: Payer: Medicare HMO | Admitting: Cardiology

## 2022-04-12 NOTE — Progress Notes (Deleted)
Cardiology Clinic Note   Patient Name: [name] Date of Encounter: 04/12/2022  Primary Care Provider:  Lottie Mussel, MD Primary Cardiologist:  None  Patient Profile    87 year old female with a hx of moderate aortic stenosis per echocardiogram 08/03/2021, , asthma, breast cancer, hypertension, hypothyroidism, hyperlipidemia,& obesity. When seen last by Dr. Gardiner Rhyme, she was having complaints of dyspnea. Felt to be related to deconditioning, however, cardiac PET was ordered. This was not completed by the patient. Repeat echocardiogram is planned.   Past Medical History    Past Medical History:  Diagnosis Date   Allergic rhinitis, cause unspecified    Asthma    Cancer ([place])    Chronic back pain    Coronary artery calcification seen on CAT scan 04/21/2017   Enlarged pulmonary artery ([place]) 04/21/2017   By chest CT   Frequent urination 11/14/2017   GERD (gastroesophageal reflux disease) 05/19/2011   history of Bilateral leg edema-likely amlodipine induced. 07/28/2011   history of CAP (community acquired pneumonia) 07/14/2011   history of HYPOTHYROIDISM, BORDERLINE 03/20/2006   Annotation: Asymptomatic, untreated Qualifier: Diagnosis of  By: Tomasa Hosteller MD, Edmon Crape.    Hx of breast cancer    Hyperlipidemia    Hypertension    Hypothyroidism    Lumbago    Murmur, cardiac 11/22/2016   OBESITY NOS 03/20/2006   Qualifier: Diagnosis of  By: Tomasa Hosteller MD, Veronique D.    OSTEOARTHRITIS 12/13/2005   Annotation: bilateral knees;right knee injection 6/05 Qualifier: Diagnosis of  By: Tomasa Hosteller MD, Edmon Crape.    Pain, dental 09/18/2020   Preventative health care 09/04/2014   Pulmonary nodule 05/30/2017   4 mm right middle lobe nodule noted on high-resolution CT done on February 06, 2017   right shoulder pain, likely Impingement syndrome  09/09/2010   Upper respiratory infection 04/09/2018   VENOUS INSUFFICIENCY, CHRONIC 03/20/2006   Qualifier: Diagnosis of  By: Tomasa Hosteller MD, Edmon Crape.    Past  Surgical History:  Procedure Laterality Date   ABDOMINAL HYSTERECTOMY     BREAST LUMPECTOMY     COLONOSCOPY     HERNIA REPAIR     HIP CLOSED REDUCTION Right 11/26/2014   Procedure: CLOSED REDUCTIION RIGHT HIP;  Surgeon: Newt Minion, MD;  Location: [facility];  Service: Orthopedics;  Laterality: Right;   INCISIONAL HERNIA REPAIR N/A 10/29/2013   Procedure: OPEN REPAIR OF RECURRENT INCISIONAL HERNIA WITH MESH;  Surgeon: Zenovia Jarred, MD;  Location: [facility];  Service: General;  Laterality: N/A;   JOINT REPLACEMENT     TOTAL HIP ARTHROPLASTY      Allergies  Allergies  Allergen Reactions   Acyclovir And Related    Metoprolol Itching   Food Rash and Other (See Comments)    Pt states that she is allergic to pickles.      History of Present Illness    Mrs. Byerly is here today for ongoing assessment and management of HTN, Moderate AoV stenosis, with history of restrictive lung disease, OSA, and Asthma leading to chronic dyspnea.   Home Medications    Current Outpatient Medications  Medication Sig Dispense Refill   acetaminophen-codeine (TYLENOL #3) 300-30 MG tablet TAKE 1 TABLET EVERY 4 HOURS AS NEEDED FOR MODERATE PAIN. 30 tablet 0   albuterol (VENTOLIN HFA) 108 (90 Base) MCG/ACT inhaler INHALE 2 PUFFS INTO THE LUNGS EVERY 6 HOURS AS NEEDED FOR WHEEZING OR SHORTNESS OF BREATH. 1 each 3   amLODipine (NORVASC) 10 MG tablet TAKE 1 TABLET EVERY DAY  90 tablet 3   aspirin EC 81 MG tablet Take 81 mg by mouth daily.     atorvastatin (LIPITOR) 40 MG tablet TAKE 1 TABLET (40 MG TOTAL) BY MOUTH DAILY. 90 tablet 3   BREO ELLIPTA 200-25 MCG/ACT AEPB INHALE 1 PUFF INTO THE LUNGS DAILY 180 each 1   brimonidine (ALPHAGAN) 0.2 % ophthalmic solution Place 1 drop into the left eye daily.     calcium citrate-vitamin D (CITRACAL+D) 315-200 MG-UNIT tablet Take 1 tablet by mouth 2 (two) times daily. 120 tablet 3   carvedilol (COREG) 25 MG tablet TAKE 1 TABLET BY MOUTH TWICE DAILY WITH A MEAL 180 tablet 3    Cyanocobalamin (VITAMIN B12 PO) Take 1 tablet daily by mouth.     diclofenac Sodium (VOLTAREN) 1 % GEL APPLY 2 GRAMS TOPICALLY 4 TIMES DAILY 700 g 10   dorzolamide-timolol (COSOPT) 22.3-6.8 MG/ML ophthalmic solution Place 1 drop into the left eye daily.     latanoprost (XALATAN) 0.005 % ophthalmic solution Place 1 drop into the left eye daily.     levothyroxine (SYNTHROID) 25 MCG tablet TAKE 1/2 TABLET EVERY DAY BEFORE BREAKFAST 45 tablet 3   losartan (COZAAR) 100 MG tablet TAKE 1 TABLET (100 MG TOTAL) BY MOUTH DAILY. 90 tablet 3   pantoprazole (PROTONIX) 40 MG tablet TAKE 1 TABLET (40 MG TOTAL) BY MOUTH DAILY. 90 tablet 3   No current facility-administered medications for this visit.     Family History    Family History  Problem Relation Age of Onset   Hypertension Mother    Alzheimer's disease Mother    Diabetes Sister    Colon cancer Neg Hx    Colon polyps Neg Hx    Esophageal cancer Neg Hx    Stomach cancer Neg Hx    Rectal cancer Neg Hx    She indicated that her mother is deceased. She indicated that the status of her father is unknown. She indicated that the status of her sister is unknown. She indicated that the status of her neg hx is unknown.  Social History    Social History   Socioeconomic History   Marital status: Legally Separated    Spouse name: Not on file   Number of children: Not on file   Years of education: Not on file   Highest education level: Not on file  Occupational History   Occupation: not working  Tobacco Use   Smoking status: Former    Packs/day: 0.25    Years: 0.50    Total pack years: 0.13    Types: Cigarettes    Quit date: 09/09/1958    Years since quitting: 63.6   Smokeless tobacco: Never   Tobacco comments:    social smoker  Vaping Use   Vaping Use: Never used  Substance and Sexual Activity   Alcohol use: No    Alcohol/week: 0.0 standard drinks of alcohol   Drug use: No   Sexual activity: Not Currently  Other Topics Concern   Not  on file  Social History Narrative   Lives with daughter.    Social Determinants of Health   Financial Resource Strain: Low Risk  (03/31/2018)   Overall Financial Resource Strain (CARDIA)    Difficulty of Paying Living Expenses: Not very hard  Food Insecurity: Unknown (03/31/2018)   Hunger Vital Sign    Worried About Running Out of Food in the Last Year: Patient refused    [place] in the Last Year: Patient refused  Transportation Needs: Unknown (03/31/2018)   [place] - Transportation    Lack of Transportation (Medical): Patient refused    Lack of Transportation (Non-Medical): Patient refused  Physical Activity: Unknown (03/31/2018)   Exercise Vital Sign    Days of Exercise per Week: Patient refused    Minutes of Exercise per Session: Patient refused  Stress: Stress Concern Present (03/31/2018)   [place]    Feeling of Stress : To some extent  Social Connections: Unknown (03/31/2018)   Social Connection and Isolation Panel [NHANES]    Frequency of Communication with Friends and Family: Patient refused    Frequency of Social Gatherings with Friends and Family: Patient refused    Attends Religious Services: Patient refused    Active Member of Clubs or Organizations: Patient refused    Attends Archivist Meetings: Patient refused    Marital Status: Patient refused  Intimate Partner Violence: Unknown (03/31/2018)   Humiliation, Afraid, Rape, and Kick questionnaire    Fear of Current or Ex-Partner: Patient refused    Emotionally Abused: Patient refused    Physically Abused: Patient refused    Sexually Abused: Patient refused     Review of Systems    General:  No chills, fever, night sweats or weight changes.  Cardiovascular:  No chest pain, dyspnea on exertion, edema, orthopnea, palpitations, paroxysmal nocturnal dyspnea. Dermatological: No rash, lesions/masses Respiratory: No cough,  dyspnea Urologic: No hematuria, dysuria Abdominal:   No nausea, vomiting, diarrhea, bright red blood per rectum, melena, or hematemesis Neurologic:  No visual changes, wkns, changes in mental status. All other systems reviewed and are otherwise negative except as noted above.     Physical Exam    VS:  There were no vitals taken for this visit. , BMI There is no height or weight on file to calculate BMI.     GEN: Well nourished, well developed, in no acute distress. HEENT: normal. Neck: Supple, no JVD, carotid bruits, or masses. Cardiac: RRR, no murmurs, rubs, or gallops. No clubbing, cyanosis, edema.  Radials/DP/PT 2+ and equal bilaterally.  Respiratory:  Respirations regular and unlabored, clear to auscultation bilaterally. GI: Soft, nontender, nondistended, BS + x 4. MS: no deformity or atrophy. Skin: warm and dry, no rash. Neuro:  Strength and sensation are intact. Psych: Normal affect.  Accessory Clinical Findings    ECG personally reviewed by me today- *** - No acute changes  Lab Results  Component Value Date   WBC 7.4 02/22/2022   HGB 14.0 02/22/2022   HCT 44.3 02/22/2022   MCV 85.4 02/22/2022   PLT PLATELET CLUMPS NOTED ON SMEAR, UNABLE TO ESTIMATE 02/22/2022   Lab Results  Component Value Date   CREATININE 0.66 05/26/2021   BUN 17 05/26/2021   NA 138 05/26/2021   K 4.3 05/26/2021   CL 98 05/26/2021   CO2 26 05/26/2021   Lab Results  Component Value Date   ALT 13 03/31/2018   AST 30 03/31/2018   ALKPHOS 72 03/31/2018   BILITOT 0.9 03/31/2018   Lab Results  Component Value Date   CHOL 142 05/26/2021   HDL 65 05/26/2021   LDLCALC 65 05/26/2021   TRIG 57 05/26/2021   CHOLHDL 2.2 05/26/2021    Lab Results  Component Value Date   HGBA1C 5.6 08/24/2021    Review of Prior Studies: Echocardiogram 08/03/2021 1. Poor acoustic windows limit study No defiinite wall motion  abnormalities . Left ventricular ejection fraction, by estimation, is 65  to 70%.  The left ventricle has normal function. Left ventricular diastolic  parameters are indeterminate.   2. Right ventricular systolic function is normal. The right ventricular  size is normal. There is mildly elevated pulmonary artery systolic  pressure.   3. Trivial mitral valve regurgitation.   4. AV is thickened, calcified with restricted motion Peak and mean  gradients through the valve are 21 and 11 mm HG respectively. AVA (VTI) is  1.1 cm2 Dimensionless index is 0.35 consistent with moderate AS. No  significant change from echo report from  2021.. The aortic valve is tricuspid. Aortic valve regurgitation is not  visualized.    Assessment & Plan   1.  ***    Current medicines are reviewed at length with the patient today.  I have spent *** min's  dedicated to the care of this patient on the date of this encounter to include pre-visit review of records, assessment, management and diagnostic testing,with shared decision making. Signed, Phill Myron. West Pugh, ANP, [place]   04/12/2022 3:04 PM      Office [phone] Fax [phone]  Notice: This dictation was prepared with Dragon dictation along with smaller phrase technology. Any transcriptional errors that result from this process are unintentional and may not be corrected upon review.

## 2022-04-14 DIAGNOSIS — H34832 Tributary (branch) retinal vein occlusion, left eye, with macular edema: Secondary | ICD-10-CM | POA: Diagnosis not present

## 2022-04-14 DIAGNOSIS — H25811 Combined forms of age-related cataract, right eye: Secondary | ICD-10-CM | POA: Diagnosis not present

## 2022-04-14 DIAGNOSIS — H3582 Retinal ischemia: Secondary | ICD-10-CM | POA: Diagnosis not present

## 2022-04-14 DIAGNOSIS — Z961 Presence of intraocular lens: Secondary | ICD-10-CM | POA: Diagnosis not present

## 2022-04-14 DIAGNOSIS — H4052X3 Glaucoma secondary to other eye disorders, left eye, severe stage: Secondary | ICD-10-CM | POA: Diagnosis not present

## 2022-04-14 DIAGNOSIS — H26492 Other secondary cataract, left eye: Secondary | ICD-10-CM | POA: Diagnosis not present

## 2022-04-15 ENCOUNTER — Ambulatory Visit: Payer: Medicare HMO | Attending: Cardiology | Admitting: Adult Health

## 2022-04-26 NOTE — Progress Notes (Unsigned)
[facility]: Clinic Note  Subjective:  History of Present Illness: [name] is a 87 y.o. year old female who presents for routine follow up of her chronic medical conditions. She was last seen by Dr. Dareen Piano in 02/2022, and I'll be her new PCP.   # ILD - Sees Pulmonology  # HLD  # platelet clump  - cbc   # aortic stenosis - nish planning annual tte & cards referral   Please refer to Assessment and Plan below for full details in Problem-Based Charting.   Past Medical History:  Patient Active Problem List   Diagnosis Date Noted   ILD (interstitial lung disease) ([place]) 01/03/2022   Neck pain 09/15/2021   Right shoulder pain 08/24/2021   Moderate aortic stenosis 08/24/2021   Cough 04/09/2021   Chronic respiratory failure with hypoxia ([place]) 12/16/2020   OSA (obstructive sleep apnea) 12/16/2020   Asthma 12/16/2020   Pruritus 10/21/2020   Elevated hemidiaphragm 03/10/2020   Dyspnea on exertion 03/10/2020   Healthcare maintenance 03/10/2020   Nocturnal hypoxia 03/10/2020   Anemia 04/09/2018   Coronary artery calcification seen on CAT scan 04/21/2017   Enlarged pulmonary artery ([place]) 04/21/2017   Restrictive lung disease 11/22/2016   Borderline diabetes 09/19/2013   Hypokalemia 06/27/2011   GERD (gastroesophageal reflux disease) 05/19/2011   Hyperlipidemia 05/02/2007   ALLERGIC RHINITIS 09/06/2006   Hypothyroidism 03/20/2006   Severe obesity (BMI >= 40) ([place]) 03/20/2006   Chronic venous insufficiency 03/20/2006   Right-sided low back pain with right-sided sciatica 03/20/2006   BREAST CANCER, HX OF 03/20/2006   Essential hypertension 12/13/2005   Osteoarthritis 12/13/2005    Social History: Reviewed in [place]. Pertinent updates today include: ***  Family History: Reviewed in Epic. Pertinent updates today include: None  Medications:  Current Outpatient Medications:    acetaminophen-codeine (TYLENOL #3) 300-30 MG tablet, TAKE 1 TABLET EVERY 4  HOURS AS NEEDED FOR MODERATE PAIN., Disp: 30 tablet, Rfl: 0   albuterol (VENTOLIN HFA) 108 (90 Base) MCG/ACT inhaler, INHALE 2 PUFFS INTO THE LUNGS EVERY 6 HOURS AS NEEDED FOR WHEEZING OR SHORTNESS OF BREATH., Disp: 1 each, Rfl: 3   amLODipine (NORVASC) 10 MG tablet, TAKE 1 TABLET EVERY DAY, Disp: 90 tablet, Rfl: 3   aspirin EC 81 MG tablet, Take 81 mg by mouth daily., Disp: , Rfl:    atorvastatin (LIPITOR) 40 MG tablet, TAKE 1 TABLET (40 MG TOTAL) BY MOUTH DAILY., Disp: 90 tablet, Rfl: 3   BREO ELLIPTA 200-25 MCG/ACT AEPB, INHALE 1 PUFF INTO THE LUNGS DAILY, Disp: 180 each, Rfl: 1   brimonidine (ALPHAGAN) 0.2 % ophthalmic solution, Place 1 drop into the left eye daily., Disp: , Rfl:    calcium citrate-vitamin D (CITRACAL+D) 315-200 MG-UNIT tablet, Take 1 tablet by mouth 2 (two) times daily., Disp: 120 tablet, Rfl: 3   carvedilol (COREG) 25 MG tablet, TAKE 1 TABLET BY MOUTH TWICE DAILY WITH A MEAL, Disp: 180 tablet, Rfl: 3   Cyanocobalamin (VITAMIN B12 PO), Take 1 tablet daily by mouth., Disp: , Rfl:    diclofenac Sodium (VOLTAREN) 1 % GEL, APPLY 2 GRAMS TOPICALLY 4 TIMES DAILY, Disp: 700 g, Rfl: 10   dorzolamide-timolol (COSOPT) 22.3-6.8 MG/ML ophthalmic solution, Place 1 drop into the left eye daily., Disp: , Rfl:    latanoprost (XALATAN) 0.005 % ophthalmic solution, Place 1 drop into the left eye daily., Disp: , Rfl:    levothyroxine (SYNTHROID) 25 MCG tablet, TAKE 1/2 TABLET EVERY DAY BEFORE BREAKFAST, Disp: 45  tablet, Rfl: 3   losartan (COZAAR) 100 MG tablet, TAKE 1 TABLET (100 MG TOTAL) BY MOUTH DAILY., Disp: 90 tablet, Rfl: 3   pantoprazole (PROTONIX) 40 MG tablet, TAKE 1 TABLET (40 MG TOTAL) BY MOUTH DAILY., Disp: 90 tablet, Rfl: 3   Allergies: Allergies  Allergen Reactions   Acyclovir And Related    Metoprolol Itching   Food Rash and Other (See Comments)    Pt states that she is allergic to pickles.      Review of Systems: ROS   Objective:   Vitals: There were no vitals filed  for this visit.  Physical Exam: Physical Exam   Data: Labs, imaging, and micro were reviewed in Epic. Refer to Assessment and Plan below for full details in Problem-Based Charting.  Assessment & Plan:  No problem-specific Assessment & Plan notes found for this encounter.     Patient will follow up in ***  Lottie Mussel, MD

## 2022-04-27 ENCOUNTER — Ambulatory Visit (INDEPENDENT_AMBULATORY_CARE_PROVIDER_SITE_OTHER): Payer: Medicare HMO | Admitting: Internal Medicine

## 2022-04-27 ENCOUNTER — Ambulatory Visit: Payer: Medicare HMO

## 2022-04-27 ENCOUNTER — Encounter: Payer: Self-pay | Admitting: Internal Medicine

## 2022-04-27 VITALS — BP 136/88 | HR 72 | Temp 97.6°F | Ht 62.0 in | Wt 241.4 lb

## 2022-04-27 DIAGNOSIS — R3581 Nocturnal polyuria: Secondary | ICD-10-CM

## 2022-04-27 DIAGNOSIS — E039 Hypothyroidism, unspecified: Secondary | ICD-10-CM | POA: Diagnosis not present

## 2022-04-27 DIAGNOSIS — I10 Essential (primary) hypertension: Secondary | ICD-10-CM

## 2022-04-27 DIAGNOSIS — E7800 Pure hypercholesterolemia, unspecified: Secondary | ICD-10-CM | POA: Diagnosis not present

## 2022-04-27 DIAGNOSIS — J984 Other disorders of lung: Secondary | ICD-10-CM | POA: Diagnosis not present

## 2022-04-27 DIAGNOSIS — I2510 Atherosclerotic heart disease of native coronary artery without angina pectoris: Secondary | ICD-10-CM

## 2022-04-27 DIAGNOSIS — D696 Thrombocytopenia, unspecified: Secondary | ICD-10-CM

## 2022-04-27 DIAGNOSIS — M5441 Lumbago with sciatica, right side: Secondary | ICD-10-CM | POA: Diagnosis not present

## 2022-04-27 DIAGNOSIS — I350 Nonrheumatic aortic (valve) stenosis: Secondary | ICD-10-CM

## 2022-04-27 DIAGNOSIS — K219 Gastro-esophageal reflux disease without esophagitis: Secondary | ICD-10-CM

## 2022-04-27 DIAGNOSIS — Z87891 Personal history of nicotine dependence: Secondary | ICD-10-CM

## 2022-04-27 MED ORDER — ACETAMINOPHEN-CODEINE 300-30 MG PO TABS: ORAL_TABLET | ORAL | 0 refills | Status: DC

## 2022-04-27 NOTE — Progress Notes (Signed)
SATURATION QUALIFICATIONS: (This note is used to comply with regulatory documentation for home oxygen)  Patient Saturations on Room Air at Rest = 96%  Patient Saturations on Room Air while Ambulating = 83%  Patient Saturations on 2 Liters of oxygen while Ambulating = 98%  Please briefly explain why patient needs home oxygen: see MD notes

## 2022-04-27 NOTE — Assessment & Plan Note (Signed)
-   Since she was in her 1s, patient has to get up multiple times to urinate throughout the night, starting at 2am. This did not start with menopause. No burning or irritation when she pees. No vaginal dryness, itching, or pain. She stops drinking any liquids at 7pm, goes to bed at 10pm. This problem has not changed lately. She does drink Diet Sprite and Gingerale, which may have caffeine in them.  - For a first step, stop all afternoon caffeine consumption.  - If not improved, will check A1C, U/A at next visit

## 2022-04-27 NOTE — Assessment & Plan Note (Addendum)
-   Patient has restrictive lung disease, likely secondary to many factors including obesity and mild fibrotic lung disease seen on CT imaging - She follows with Pulmonology, who is doing annual CT imaging and monitoring clinical symptoms - Last CT in 11/2021 with "modestly suspicious for minimal fibrotic interstitial lung disease, again in an early indeterminate for UIP pattern" - Pulm said they will discuss antifibrotics if disease progresses, but they're not sure if she'll tolerate - today, she is not doing great. She is having severely limiting dyspnea on exertion. She is unable to ambulate short distances (20 feet) without significant effort, dyspnea, and hypoxia if she is not wearing her oxygen. Her goal is to get to church, which she has not done in a year because her current oxygen tank is too heavy for her to manage, and she gets too dyspneic without it. We demonstrated how to use the portable oxygen today. Unfortunately, the backpack device she's hoping to get would be >$2000 out of pocket - continue Breo Ellipta daily and albuterol prn  - I have messaged her Pulmonologist to reschedule her appointment since she missed the last one - I will work on getting her an alternative form of oxygen

## 2022-04-27 NOTE — Assessment & Plan Note (Signed)
-   Patient has lots of pill burden and GERD is stable, so we're going to try to wean off PPI. Decrease pantoprazole to 75m every other day

## 2022-04-27 NOTE — Assessment & Plan Note (Signed)
-   she brought her medicines in today, but does not have levothyroxine among them. Will check TSH today and restart synthroid if it's needed

## 2022-04-27 NOTE — Patient Instructions (Addendum)
Dear [name],  It was a pleasure seeing you in clinic today.  For your acid reflex: stop taking your pantoprazole every day. Instead, take 1 pill every other day. If you acid reflux gets worse, then go back to taking it every day.   I am working on getting you a different portable oxygen tank.   Be sure you are drinking caffeine free sodas.   I'll see you back in 1 month.  Sincerely, Dr. Lottie Mussel

## 2022-04-27 NOTE — Assessment & Plan Note (Signed)
-   I wonder if patient's dyspnea on exertion could also be related to her aortic stenosis  - She missed her cardiology appointment on 2/9, will need to reschedule this - Due for TTE in 07/2022

## 2022-04-27 NOTE — Assessment & Plan Note (Signed)
-   This problem is chronic and stable - Continue Amlodipine 68m daily, Carvedilol 280mBID, and Losartan 10057maily

## 2022-04-27 NOTE — Assessment & Plan Note (Signed)
-   This problem is chronic and stable - Last LDL 65 and HDL 47, no need to check further lipid panels at this time - Continue Lipitor 66m daily

## 2022-04-27 NOTE — Assessment & Plan Note (Signed)
-   Continue Tylenol 3 (refilled today) and Voltaren gel

## 2022-04-27 NOTE — Assessment & Plan Note (Signed)
-   Continue Aspirin 39m daily and Atorvastatin 446mdaily  - Dr NaDareen Pianoad referred patient to Cardiology in 08/2021 for consideration of a stress test due to her worsening dyspnea on exertion

## 2022-04-29 LAB — TSH: TSH: 4.29 u[IU]/mL (ref 0.450–4.500)

## 2022-04-29 LAB — CBC
Hematocrit: 36.3 % (ref 34.0–46.6)
Hemoglobin: 11.7 g/dL (ref 11.1–15.9)
MCH: 27.4 pg (ref 26.6–33.0)
MCHC: 32.2 g/dL (ref 31.5–35.7)
MCV: 85 fL (ref 79–97)
Platelets: 215 10*3/uL (ref 150–450)
RBC: 4.27 x10E6/uL (ref 3.77–5.28)
RDW: 13 % (ref 11.7–15.4)
WBC: 7.6 10*3/uL (ref 3.4–10.8)

## 2022-04-29 NOTE — Progress Notes (Signed)
Called patient to review labs. Labs all look stable. Platelets and TSH are normal. No further action needed.  She is taking PPI every other day now, and not having reflux symptoms.

## 2022-05-06 ENCOUNTER — Telehealth: Payer: Medicare HMO | Admitting: Student

## 2022-05-07 ENCOUNTER — Emergency Department (HOSPITAL_COMMUNITY): Payer: Medicare HMO

## 2022-05-07 ENCOUNTER — Other Ambulatory Visit: Payer: Self-pay

## 2022-05-07 ENCOUNTER — Inpatient Hospital Stay (HOSPITAL_COMMUNITY)
Admission: EM | Admit: 2022-05-07 | Discharge: 2022-05-11 | DRG: 291 | Disposition: A | Discharge status: Home Health | Payer: Medicare HMO | Attending: Internal Medicine | Admitting: Internal Medicine

## 2022-05-07 DIAGNOSIS — R509 Fever, unspecified: Secondary | ICD-10-CM | POA: Diagnosis not present

## 2022-05-07 DIAGNOSIS — Z1152 Encounter for screening for COVID-19: Secondary | ICD-10-CM | POA: Diagnosis not present

## 2022-05-07 DIAGNOSIS — J986 Disorders of diaphragm: Secondary | ICD-10-CM | POA: Diagnosis present

## 2022-05-07 DIAGNOSIS — Z79890 Hormone replacement therapy (postmenopausal): Secondary | ICD-10-CM

## 2022-05-07 DIAGNOSIS — Z96641 Presence of right artificial hip joint: Secondary | ICD-10-CM | POA: Diagnosis present

## 2022-05-07 DIAGNOSIS — J984 Other disorders of lung: Secondary | ICD-10-CM | POA: Diagnosis not present

## 2022-05-07 DIAGNOSIS — I5033 Acute on chronic diastolic (congestive) heart failure: Secondary | ICD-10-CM | POA: Diagnosis not present

## 2022-05-07 DIAGNOSIS — Z6841 Body Mass Index (BMI) 40.0 and over, adult: Secondary | ICD-10-CM

## 2022-05-07 DIAGNOSIS — I10 Essential (primary) hypertension: Secondary | ICD-10-CM | POA: Diagnosis present

## 2022-05-07 DIAGNOSIS — Z9981 Dependence on supplemental oxygen: Secondary | ICD-10-CM

## 2022-05-07 DIAGNOSIS — Z7982 Long term (current) use of aspirin: Secondary | ICD-10-CM | POA: Diagnosis not present

## 2022-05-07 DIAGNOSIS — J441 Chronic obstructive pulmonary disease with (acute) exacerbation: Secondary | ICD-10-CM | POA: Insufficient documentation

## 2022-05-07 DIAGNOSIS — E039 Hypothyroidism, unspecified: Secondary | ICD-10-CM | POA: Diagnosis not present

## 2022-05-07 DIAGNOSIS — Z888 Allergy status to other drugs, medicaments and biological substances status: Secondary | ICD-10-CM | POA: Diagnosis not present

## 2022-05-07 DIAGNOSIS — I110 Hypertensive heart disease with heart failure: Principal | ICD-10-CM | POA: Diagnosis present

## 2022-05-07 DIAGNOSIS — J849 Interstitial pulmonary disease, unspecified: Secondary | ICD-10-CM | POA: Diagnosis present

## 2022-05-07 DIAGNOSIS — I350 Nonrheumatic aortic (valve) stenosis: Secondary | ICD-10-CM | POA: Diagnosis not present

## 2022-05-07 DIAGNOSIS — R0602 Shortness of breath: Secondary | ICD-10-CM | POA: Diagnosis not present

## 2022-05-07 DIAGNOSIS — E662 Morbid (severe) obesity with alveolar hypoventilation: Secondary | ICD-10-CM | POA: Diagnosis not present

## 2022-05-07 DIAGNOSIS — I272 Pulmonary hypertension, unspecified: Secondary | ICD-10-CM | POA: Diagnosis present

## 2022-05-07 DIAGNOSIS — K219 Gastro-esophageal reflux disease without esophagitis: Secondary | ICD-10-CM | POA: Diagnosis present

## 2022-05-07 DIAGNOSIS — I5031 Acute diastolic (congestive) heart failure: Secondary | ICD-10-CM

## 2022-05-07 DIAGNOSIS — G4733 Obstructive sleep apnea (adult) (pediatric): Secondary | ICD-10-CM | POA: Diagnosis present

## 2022-05-07 DIAGNOSIS — E876 Hypokalemia: Secondary | ICD-10-CM | POA: Diagnosis present

## 2022-05-07 DIAGNOSIS — I2510 Atherosclerotic heart disease of native coronary artery without angina pectoris: Secondary | ICD-10-CM | POA: Diagnosis present

## 2022-05-07 DIAGNOSIS — Z7951 Long term (current) use of inhaled steroids: Secondary | ICD-10-CM | POA: Diagnosis not present

## 2022-05-07 DIAGNOSIS — Z79899 Other long term (current) drug therapy: Secondary | ICD-10-CM

## 2022-05-07 DIAGNOSIS — J9601 Acute respiratory failure with hypoxia: Secondary | ICD-10-CM

## 2022-05-07 DIAGNOSIS — J9611 Chronic respiratory failure with hypoxia: Secondary | ICD-10-CM | POA: Diagnosis present

## 2022-05-07 DIAGNOSIS — J9621 Acute and chronic respiratory failure with hypoxia: Secondary | ICD-10-CM | POA: Diagnosis not present

## 2022-05-07 DIAGNOSIS — E8770 Fluid overload, unspecified: Secondary | ICD-10-CM | POA: Diagnosis not present

## 2022-05-07 DIAGNOSIS — I509 Heart failure, unspecified: Secondary | ICD-10-CM

## 2022-05-07 DIAGNOSIS — J45909 Unspecified asthma, uncomplicated: Secondary | ICD-10-CM | POA: Diagnosis not present

## 2022-05-07 DIAGNOSIS — Z853 Personal history of malignant neoplasm of breast: Secondary | ICD-10-CM

## 2022-05-07 DIAGNOSIS — R5381 Other malaise: Secondary | ICD-10-CM

## 2022-05-07 DIAGNOSIS — Z87891 Personal history of nicotine dependence: Secondary | ICD-10-CM | POA: Diagnosis not present

## 2022-05-07 LAB — CBC WITH DIFFERENTIAL/PLATELET
Abs Immature Granulocytes: 0.01 10*3/uL (ref 0.00–0.07)
Basophils Absolute: 0.1 10*3/uL (ref 0.0–0.1)
Basophils Relative: 1 %
Eosinophils Absolute: 0.2 10*3/uL (ref 0.0–0.5)
Eosinophils Relative: 3 %
HCT: 33.8 % — ABNORMAL LOW (ref 36.0–46.0)
Hemoglobin: 10.7 g/dL — ABNORMAL LOW (ref 12.0–15.0)
Immature Granulocytes: 0 %
Lymphocytes Relative: 22 %
Lymphs Abs: 1.6 10*3/uL (ref 0.7–4.0)
MCH: 27.5 pg (ref 26.0–34.0)
MCHC: 31.7 g/dL (ref 30.0–36.0)
MCV: 86.9 fL (ref 80.0–100.0)
Monocytes Absolute: 0.8 10*3/uL (ref 0.1–1.0)
Monocytes Relative: 11 %
Neutro Abs: 4.4 10*3/uL (ref 1.7–7.7)
Neutrophils Relative %: 63 %
Platelets: 134 10*3/uL — ABNORMAL LOW (ref 150–400)
RBC: 3.89 MIL/uL (ref 3.87–5.11)
RDW: 13.8 % (ref 11.5–15.5)
WBC: 7 10*3/uL (ref 4.0–10.5)
nRBC: 0 % (ref 0.0–0.2)

## 2022-05-07 LAB — BASIC METABOLIC PANEL
Anion gap: 11 (ref 5–15)
BUN: 10 mg/dL (ref 8–23)
CO2: 29 mmol/L (ref 22–32)
Calcium: 9.4 mg/dL (ref 8.9–10.3)
Chloride: 99 mmol/L (ref 98–111)
Creatinine, Ser: 0.71 mg/dL (ref 0.44–1.00)
GFR, Estimated: >60 mL/min (ref >60)
Glucose, Bld: 140 mg/dL — ABNORMAL HIGH (ref 70–99)
Potassium: 3.2 mmol/L — ABNORMAL LOW (ref 3.5–5.1)
Sodium: 139 mmol/L (ref 135–145)

## 2022-05-07 LAB — BRAIN NATRIURETIC PEPTIDE: B Natriuretic Peptide: 189 pg/mL — ABNORMAL HIGH (ref 0.0–100.0)

## 2022-05-07 LAB — RESP PANEL BY RT-PCR (RSV, FLU A&B, COVID)  RVPGX2
Influenza A by PCR: NEGATIVE
Influenza B by PCR: NEGATIVE
Resp Syncytial Virus by PCR: NEGATIVE
SARS Coronavirus 2 by RT PCR: NEGATIVE

## 2022-05-07 LAB — TROPONIN I (HIGH SENSITIVITY)
Troponin I (High Sensitivity): 5 ng/L (ref <18)
Troponin I (High Sensitivity): 7 ng/L (ref <18)

## 2022-05-07 MED ORDER — CARVEDILOL 25 MG PO TABS
25.0000 mg | ORAL_TABLET | Freq: Two times a day (BID) | ORAL | Status: DC
  Administered 2022-05-08 – 2022-05-11 (×7): 25 mg via ORAL
  Filled 2022-05-07 (×7): qty 1

## 2022-05-07 MED ORDER — ASPIRIN 81 MG PO TBEC
81.0000 mg | DELAYED_RELEASE_TABLET | Freq: Every day | ORAL | Status: DC
  Administered 2022-05-08 – 2022-05-11 (×4): 81 mg via ORAL
  Filled 2022-05-07 (×4): qty 1

## 2022-05-07 MED ORDER — FLUTICASONE FUROATE-VILANTEROL 200-25 MCG/ACT IN AEPB: 1.0000 | INHALATION_SPRAY | Freq: Every day | RESPIRATORY_TRACT | Status: DC

## 2022-05-07 MED ORDER — PANTOPRAZOLE SODIUM 40 MG PO TBEC
40.0000 mg | DELAYED_RELEASE_TABLET | Freq: Every day | ORAL | Status: DC
  Administered 2022-05-08 – 2022-05-11 (×4): 40 mg via ORAL
  Filled 2022-05-07 (×4): qty 1

## 2022-05-07 MED ORDER — HYDROCODONE-ACETAMINOPHEN 5-325 MG PO TABS: 1.0000 | ORAL_TABLET | ORAL | Status: DC | PRN

## 2022-05-07 MED ORDER — POTASSIUM CHLORIDE 10 MEQ/100ML IV SOLN: 10.0000 meq | INTRAVENOUS | Status: DC

## 2022-05-07 MED ORDER — SODIUM CHLORIDE 0.9% FLUSH
3.0000 mL | Freq: Two times a day (BID) | INTRAVENOUS | Status: DC
  Administered 2022-05-08 – 2022-05-11 (×8): 3 mL via INTRAVENOUS

## 2022-05-07 MED ORDER — LEVOTHYROXINE SODIUM 25 MCG PO TABS
25.0000 ug | ORAL_TABLET | Freq: Every day | ORAL | Status: DC
  Administered 2022-05-08 – 2022-05-10 (×3): 25 ug via ORAL
  Filled 2022-05-07 (×3): qty 1

## 2022-05-07 MED ORDER — IPRATROPIUM-ALBUTEROL 0.5-2.5 (3) MG/3ML IN SOLN
3.0000 mL | RESPIRATORY_TRACT | Status: DC | PRN
  Administered 2022-05-08: 3 mL via RESPIRATORY_TRACT
  Filled 2022-05-07: qty 3

## 2022-05-07 MED ORDER — FLUTICASONE FUROATE-VILANTEROL 200-25 MCG/ACT IN AEPB
1.0000 | INHALATION_SPRAY | Freq: Every day | RESPIRATORY_TRACT | Status: DC
  Administered 2022-05-08 – 2022-05-11 (×4): 1 via RESPIRATORY_TRACT
  Filled 2022-05-07: qty 28

## 2022-05-07 MED ORDER — FUROSEMIDE 10 MG/ML IJ SOLN: 40.0000 mg | Freq: Every day | INTRAMUSCULAR | Status: DC

## 2022-05-07 MED ORDER — LATANOPROST 0.005 % OP SOLN
1.0000 [drp] | Freq: Every day | OPHTHALMIC | Status: DC
  Administered 2022-05-08 – 2022-05-11 (×4): 1 [drp] via OPHTHALMIC
  Filled 2022-05-07: qty 2.5

## 2022-05-07 MED ORDER — ATORVASTATIN CALCIUM 40 MG PO TABS
40.0000 mg | ORAL_TABLET | Freq: Every day | ORAL | Status: DC
  Administered 2022-05-08 – 2022-05-11 (×4): 40 mg via ORAL
  Filled 2022-05-07 (×4): qty 1

## 2022-05-07 MED ORDER — PREDNISONE 20 MG PO TABS: 40.0000 mg | ORAL_TABLET | Freq: Every day | ORAL | Status: DC

## 2022-05-07 MED ORDER — POTASSIUM CHLORIDE CRYS ER 20 MEQ PO TBCR
40.0000 meq | EXTENDED_RELEASE_TABLET | Freq: Four times a day (QID) | ORAL | Status: AC
  Administered 2022-05-08 (×3): 40 meq via ORAL
  Filled 2022-05-07 (×3): qty 2

## 2022-05-07 MED ORDER — SODIUM CHLORIDE 0.9% FLUSH: 3.0000 mL | INTRAVENOUS | Status: DC | PRN

## 2022-05-07 MED ORDER — SODIUM CHLORIDE 0.9 % IV SOLN: 250.0000 mL | INTRAVENOUS | Status: DC | PRN

## 2022-05-07 MED ORDER — METHYLPREDNISOLONE SODIUM SUCC 40 MG IJ SOLR: 40.0000 mg | Freq: Two times a day (BID) | INTRAMUSCULAR | Status: DC

## 2022-05-07 MED ORDER — CALCIUM CARBONATE ANTACID 500 MG PO CHEW
1.0000 | CHEWABLE_TABLET | Freq: Three times a day (TID) | ORAL | Status: DC | PRN
  Administered 2022-05-10: 200 mg via ORAL
  Filled 2022-05-07: qty 1

## 2022-05-07 MED ORDER — IPRATROPIUM-ALBUTEROL 0.5-2.5 (3) MG/3ML IN SOLN
3.0000 mL | Freq: Once | RESPIRATORY_TRACT | Status: AC
  Administered 2022-05-07: 3 mL via RESPIRATORY_TRACT
  Filled 2022-05-07: qty 3

## 2022-05-07 MED ORDER — ACETAMINOPHEN 325 MG PO TABS
650.0000 mg | ORAL_TABLET | Freq: Four times a day (QID) | ORAL | Status: DC | PRN
  Administered 2022-05-10: 650 mg via ORAL
  Filled 2022-05-07 (×2): qty 2

## 2022-05-07 MED ORDER — ACETAMINOPHEN 650 MG RE SUPP: 650.0000 mg | Freq: Four times a day (QID) | RECTAL | Status: DC | PRN

## 2022-05-07 MED ORDER — DORZOLAMIDE HCL-TIMOLOL MAL 2-0.5 % OP SOLN
1.0000 [drp] | Freq: Every day | OPHTHALMIC | Status: DC
  Administered 2022-05-08 – 2022-05-11 (×4): 1 [drp] via OPHTHALMIC
  Filled 2022-05-07: qty 10

## 2022-05-07 MED ORDER — SODIUM CHLORIDE 0.9 % IV SOLN: 1.0000 g | INTRAVENOUS | Status: DC

## 2022-05-07 NOTE — ED Notes (Signed)
ED TO INPATIENT HANDOFF REPORT  ED Nurse Name and Phone #:   S Name/Age/Gender [name] 87 y.o. female Room/Bed: 019C/019C  Code Status   Code Status: Prior  Home/SNF/Other Home Patient oriented to: self, place, time, and situation Is this baseline? Yes   Triage Complete: Triage complete  Chief Complaint COPD with acute exacerbation [facility]) [J44.1]  Triage Note Pt BIB [facility] from home due to [facility] that started Wednesday 05/04/22.  Pt has hx of COPD.  20g left forearm.  '10mg'$  Albuterol, '1mg'$  Atrovent and '125mg'$  Solumedrol given en route.  VS BP 150/86, HR 80, SpO2 96%, CBG 120.   Allergies Allergies  Allergen Reactions   Acyclovir And Related    Metoprolol Itching   Food Rash and Other (See Comments)    Pt states that she is allergic to pickles.      Level of Care/Admitting Diagnosis ED Disposition     ED Disposition  Admit   Condition  --   [place]: [facility] [[id number]]  Level of Care: Tele[facility] [[id number]]  May admit patient to [facility] or [facility] if equivalent level of care is available:: No  Covid Evaluation: Confirmed COVID Negative  Diagnosis: COPD with acute exacerbation [id number]  Admitting Physician: Toy Baker [id number]  Attending Physician: Toy Baker [id number]  Certification:: I certify this patient will need inpatient services for at least 2 midnights  Estimated Length of Stay: 2          B Medical/Surgery History Past Medical History:  Diagnosis Date   Allergic rhinitis, cause unspecified    Asthma    Cancer ([place])    Chronic back pain    Coronary artery calcification seen on CAT scan 04/21/2017   Enlarged pulmonary artery ([place]) 04/21/2017   By chest CT   Frequent urination 11/14/2017   GERD (gastroesophageal reflux disease) 05/19/2011   history of Bilateral leg edema-likely amlodipine induced. 07/28/2011   history of CAP (community acquired pneumonia) 07/14/2011   history of  HYPOTHYROIDISM, BORDERLINE 03/20/2006   Annotation: Asymptomatic, untreated Qualifier: Diagnosis of  By: Tomasa Hosteller MD, Edmon Crape.    Hx of breast cancer    Hyperlipidemia    Hypertension    Hypothyroidism    Lumbago    Murmur, cardiac 11/22/2016   OBESITY NOS 03/20/2006   Qualifier: Diagnosis of  By: Tomasa Hosteller MD, Veronique D.    OSTEOARTHRITIS 12/13/2005   Annotation: bilateral knees;right knee injection 6/05 Qualifier: Diagnosis of  By: Tomasa Hosteller MD, Edmon Crape.    Pain, dental 09/18/2020   Preventative health care 09/04/2014   Pulmonary nodule 05/30/2017   4 mm right middle lobe nodule noted on high-resolution CT done on February 06, 2017   right shoulder pain, likely Impingement syndrome  09/09/2010   Upper respiratory infection 04/09/2018   VENOUS INSUFFICIENCY, CHRONIC 03/20/2006   Qualifier: Diagnosis of  By: Tomasa Hosteller MD, Edmon Crape.    Past Surgical History:  Procedure Laterality Date   ABDOMINAL HYSTERECTOMY     BREAST LUMPECTOMY     COLONOSCOPY     HERNIA REPAIR     HIP CLOSED REDUCTION Right 11/26/2014   Procedure: CLOSED REDUCTIION RIGHT HIP;  Surgeon: Newt Minion, MD;  Location: [facility];  Service: Orthopedics;  Laterality: Right;   INCISIONAL HERNIA REPAIR N/A 10/29/2013   Procedure: OPEN REPAIR OF RECURRENT INCISIONAL HERNIA WITH MESH;  Surgeon: Zenovia Jarred, MD;  Location: [facility];  Service: General;  Laterality: N/A;   JOINT  REPLACEMENT     TOTAL HIP ARTHROPLASTY       A IV Location/Drains/Wounds Patient Lines/Drains/Airways Status     Active Line/Drains/Airways     Name Placement date Placement time Site Days   Peripheral IV 05/07/22 20 G Distal;Left;Posterior Forearm 05/07/22  1825  Forearm  less than 1            Intake/Output Last 24 hours No intake or output data in the 24 hours ending 05/07/22 2215  Labs/Imaging Results for orders placed or performed during the hospital encounter of 05/07/22 (from the past 48 hour(s))  Resp panel by RT-PCR (RSV, Flu  A&B, Covid) Anterior Nasal Swab     Status: None   Collection Time: 05/07/22  6:50 PM   Specimen: Anterior Nasal Swab  Result Value Ref Range   SARS Coronavirus 2 by RT PCR NEGATIVE NEGATIVE   Influenza A by PCR NEGATIVE NEGATIVE   Influenza B by PCR NEGATIVE NEGATIVE    Comment: (NOTE) The Xpert Xpress SARS-CoV-2/FLU/RSV plus assay is intended as an aid in the diagnosis of influenza from Nasopharyngeal swab specimens and should not be used as a sole basis for treatment. Nasal washings and aspirates are unacceptable for Xpert Xpress SARS-CoV-2/FLU/RSV testing.  Fact Sheet for Patients: EntrepreneurPulse.com.au  Fact Sheet for Healthcare Providers: IncredibleEmployment.be  This test is not yet approved or cleared by the [place] FDA and has been authorized for detection and/or diagnosis of SARS-CoV-2 by FDA under an Emergency Use Authorization (EUA). This EUA will remain in effect (meaning this test can be used) for the duration of the COVID-19 declaration under Section 564(b)(1) of the Act, 21 U.S.C. section 360bbb-3(b)(1), unless the authorization is terminated or revoked.     Resp Syncytial Virus by PCR NEGATIVE NEGATIVE    Comment: (NOTE) Fact Sheet for Patients: EntrepreneurPulse.com.au  Fact Sheet for Healthcare Providers: IncredibleEmployment.be  This test is not yet approved or cleared by the [place] FDA and has been authorized for detection and/or diagnosis of SARS-CoV-2 by FDA under an Emergency Use Authorization (EUA). This EUA will remain in effect (meaning this test can be used) for the duration of the COVID-19 declaration under Section 564(b)(1) of the Act, 21 U.S.C. section 360bbb-3(b)(1), unless the authorization is terminated or revoked.  Performed at [facility], [place] [address], [place], [place] [address]   CBC with Differential     Status: Abnormal   Collection  Time: 05/07/22  6:50 PM  Result Value Ref Range   WBC 7.0 4.0 - 10.5 K/uL   RBC 3.89 3.87 - 5.11 MIL/uL   Hemoglobin 10.7 (L) 12.0 - 15.0 g/dL   HCT 33.8 (L) 36.0 - 46.0 %   MCV 86.9 80.0 - 100.0 fL   MCH 27.5 26.0 - 34.0 pg   MCHC 31.7 30.0 - 36.0 g/dL   RDW 13.8 11.5 - 15.5 %   Platelets 134 (L) 150 - 400 K/uL    Comment: REPEATED TO VERIFY PLATELET COUNT CONFIRMED BY SMEAR    nRBC 0.0 0.0 - 0.2 %   Neutrophils Relative % 63 %   Neutro Abs 4.4 1.7 - 7.7 K/uL   Lymphocytes Relative 22 %   Lymphs Abs 1.6 0.7 - 4.0 K/uL   Monocytes Relative 11 %   Monocytes Absolute 0.8 0.1 - 1.0 K/uL   Eosinophils Relative 3 %   Eosinophils Absolute 0.2 0.0 - 0.5 K/uL   Basophils Relative 1 %   Basophils Absolute 0.1 0.0 - 0.1 K/uL  Immature Granulocytes 0 %   Abs Immature Granulocytes 0.01 0.00 - 0.07 K/uL    Comment: Performed at [facility], [place] [address], [place], [place] [address]  Troponin I (High Sensitivity)     Status: None   Collection Time: 05/07/22  6:50 PM  Result Value Ref Range   Troponin I (High Sensitivity) 5 <18 ng/L    Comment: (NOTE) Elevated high sensitivity troponin I (hsTnI) values and significant  changes across serial measurements may suggest ACS but many other  chronic and acute conditions are known to elevate hsTnI results.  Refer to the "Links" section for chest pain algorithms and additional  guidance. Performed at [facility], [place] [address], [place], [place] [address]   Brain natriuretic peptide     Status: Abnormal   Collection Time: 05/07/22  6:50 PM  Result Value Ref Range   B Natriuretic Peptide 189.0 (H) 0.0 - 100.0 pg/mL    Comment: Performed at [facility] [address], [place], [place] [address]  Troponin I (High Sensitivity)     Status: None   Collection Time: 05/07/22  8:44 PM  Result Value Ref Range   Troponin I (High Sensitivity) 7 <18 ng/L    Comment: (NOTE) Elevated high sensitivity troponin I (hsTnI) values and  significant  changes across serial measurements may suggest ACS but many other  chronic and acute conditions are known to elevate hsTnI results.  Refer to the "Links" section for chest pain algorithms and additional  guidance. Performed at [facility], [place] [address] [place], [place] [id number]   Basic metabolic panel     Status: Abnormal   Collection Time: 05/07/22  8:44 PM  Result Value Ref Range   Sodium 139 135 - 145 mmol/L   Potassium 3.2 (L) 3.5 - 5.1 mmol/L   Chloride 99 98 - 111 mmol/L   CO2 29 22 - 32 mmol/L   Glucose, Bld 140 (H) 70 - 99 mg/dL    Comment: Glucose reference range applies only to samples taken after fasting for at least 8 hours.   BUN 10 8 - 23 mg/dL   Creatinine, Ser 0.71 0.44 - 1.00 mg/dL   Calcium 9.4 8.9 - 10.3 mg/dL   GFR, Estimated >60 >60 mL/min    Comment: (NOTE) Calculated using the CKD-EPI Creatinine Equation (2021)    Anion gap 11 5 - 15    Comment: Performed at [place] [address] [place], [place] [address]   DG Chest Portable 1 View  Result Date: 05/07/2022 CLINICAL DATA:  Shortness of breath EXAM: PORTABLE CHEST 1 VIEW COMPARISON:  05/06/2021 FINDINGS: Cardiac shadow is mildly prominent but stable. Aortic calcifications are again seen. Elevation of left hemidiaphragm is noted. Increased central vascular congestion is noted without interstitial edema. IMPRESSION: Changes consistent with mild CHF. Electronically Signed   By: Inez Catalina M.D.   On: 05/07/2022 19:05    Pending Labs Unresulted Labs (From admission, onward)     Start     Ordered   05/08/22 0500  Prealbumin  Tomorrow morning,   R        05/07/22 2212   05/07/22 2213  Hepatic function panel  Add-on,   AD       Question:  Release to patient  Answer:  Immediate   05/07/22 2212   05/07/22 2213  CK  Add-on,   AD        05/07/22 2212  05/07/22 2213  Magnesium  Add-on,   AD        05/07/22 2212   05/07/22 2213  Phosphorus  Add-on,   AD        05/07/22 2212    05/07/22 2213  TSH  Add-on,   AD        05/07/22 2212   05/07/22 2213  Urinalysis, Complete w Microscopic -Urine, Clean Catch  Once,   URGENT       Question Answer Comment  Release to patient Immediate   Specimen Source Urine, Clean Catch      05/07/22 2212   05/07/22 2213  Protime-INR  Once,   STAT       Question:  Release to patient  Answer:  Immediate   05/07/22 2212   05/07/22 2213  Blood gas, venous  ONCE - STAT,   STAT       Question:  Release to patient  Answer:  Immediate   05/07/22 2212   05/07/22 2213  Vitamin B12  (Anemia Panel (PNL))  Once,   URGENT        05/07/22 2212   05/07/22 2213  Folate  (Anemia Panel (PNL))  Once,   URGENT        05/07/22 2212   05/07/22 2213  Iron and TIBC  (Anemia Panel (PNL))  Once,   URGENT        05/07/22 2212   05/07/22 2213  Ferritin  (Anemia Panel (PNL))  Once,   URGENT        05/07/22 2212   05/07/22 2213  Reticulocytes  (Anemia Panel (PNL))  Once,   URGENT        05/07/22 2212            Vitals/Pain Today's Vitals   05/07/22 1841 05/07/22 1845 05/07/22 2145 05/07/22 2152  BP:  (!) 168/81 (!) 142/65   Pulse:  79 86   Resp:  10 18   Temp:  98.5 F (36.9 C)  98.5 F (36.9 C)  TempSrc:  Axillary  Oral  SpO2: 100% 99% 95%   Weight: 241 lb (109.3 kg)     Height: '5\' 2"'$  (1.575 m)     PainSc: 0-No pain       Isolation Precautions No active isolations  Medications Medications  ipratropium-albuterol (DUONEB) 0.5-2.5 (3) MG/3ML nebulizer solution 3 mL (has no administration in time range)    Mobility walks with device     Focused Assessments    R Recommendations: See Admitting Provider Note  Report given to:   Additional Notes:

## 2022-05-07 NOTE — Hospital Course (Signed)
Principal Problem:   Acute on chronic hypoxic respiratory failure (HCC) Active Problems:   Hypothyroidism   Severe obesity (BMI >= 40) (HCC)   Essential hypertension   Restrictive lung disease   Elevated hemidiaphragm   OSA (obstructive sleep apnea)   Moderate aortic stenosis   CHF exacerbation (HCC)  Resolved Problems:   * No resolved hospital problems. *  Consults:***  Procedures:***  Follow-up items:***

## 2022-05-07 NOTE — H&P (Incomplete)
[name] [id number] DOB: 06/08/1935 DOA: 05/07/2022     PCP: Lottie Mussel, MD   Outpatient Specialists:  CARDS:  Dr. Gardiner Rhyme NEphrology: *  Dr. NEurology *   Dr. Pulmonary *  Dr. Chase Caller  Oncology * Dr. Fabienne Bruns* Dr.  Sadie Haber, [facility]) No care team member to display Urology Dr. *  Patient arrived to ER on 05/07/22 at 1838 Referred by Attending Lennice Sites, DO   Patient coming from:    home Lives  With family    Chief Complaint: *** Chief Complaint  Patient presents with   Shortness of Breath    HPI: [name] is a 87 y.o. female with medical history significant of COPD, interstitial lung disease on 2 L of oxygen chronically hypothyroidism, HTN     Presented with   shortness of breath Progressive SOB tried to use albuterol at home with no improvement  90% on 2L tht is her baseline  Responded better to cont neb by EMS Was also given solumedrol  125 mg        Initial COVID TEST  NEGATIVE**** POSITIVE,  ***in house  PCR testing  Pending  Lab Results  Component Value Date   [place] 05/07/2022     Regarding pertinent Chronic problems: ***  ****Hyperlipidemia - *on statins {statin:315258}  Lipid Panel     Component Value Date/Time   CHOL 142 05/26/2021 1218   TRIG 57 05/26/2021 1218   HDL 65 05/26/2021 1218   CHOLHDL 2.2 05/26/2021 1218   CHOLHDL 2.5 09/04/2014 1037   VLDL 12 09/04/2014 1037   [place] 65 05/26/2021 1218   LABVLDL 12 05/26/2021 1218    ***HTN on   Moderate ortic stenosis  - last echo 2023 AV is thickened, calcified with restricted motion Peak and mean  gradients through the valve are 21 and 11 mm HG respectively. AVA (VTI) is  1.1 cm2 Dimensionless index is 0.35 consistent with moderate AS. No  significant change from echo report from   *** CAD  - On Aspirin, statin, betablocker, Plavix                 - *followed by cardiology                - last cardiac cath  The ASCVD Risk score (Arnett DK, et al., 2019)  failed to calculate for the following reasons:   The 2019 ASCVD risk score is only valid for ages 78 to 25    ***DM 2 -  Lab Results  Component Value Date   HGBA1C 5.6 08/24/2021   ****on insulin, PO meds only, diet controlled  ***Hypothyroidism:  Lab Results  Component Value Date   TSH 4.290 04/27/2022   on synthroid    Morbid obesity-   BMI Readings from Last 1 Encounters:  05/07/22 44.08 kg/m      COPD - not **followed by pulmonology *** not  on baseline oxygen  *L,    *** OSA -on nocturnal oxygen, *CPAP, *noncompliant with CPAP  *** Hx of CVA - *with/out residual deficits on Aspirin 81 mg, 325, Plavix  ***A. Fib -  - CHA2DS2 vas score **** CHA2DS2/VAS Stroke Risk Points      N/A >= 2 Points: High Risk  1 - 1.99 Points: Medium Risk  0 Points: Low Risk    Last Change: N/A      This score determines the patient's risk of having a stroke if the  patient has atrial fibrillation.  This score is not applicable to this patient. Components are not  calculated.     current  on anticoagulation with ****Coumadin  ***Xarelto,* Eliquis,  *** Not on anticoagulation secondary to Risk of Falls, *** recurrent bleeding         -  Rate control:  Currently controlled with ***Toprolol,  *Metoprolol,* Diltiazem, *Coreg          - Rhythm control: *** amiodarone, *flecainide  ***Hx of DVT/PE on - anticoagulation with ****Coumadin  ***Xarelto,* Eliquis,   ***CKD stage III*- baseline Cr **** Estimated Creatinine Clearance: 61 mL/min (by C-G formula based on SCr of 0.71 mg/dL).  Lab Results  Component Value Date   CREATININE 0.71 05/07/2022   CREATININE 0.66 05/26/2021   CREATININE 0.84 08/20/2020     **** Liver disease Computed MELD 3.0 unavailable. Necessary lab results were not found in the last year. Computed MELD-Na unavailable. Necessary lab results were not found in the last year.    Chronic anemia - baseline hg Hemoglobin & Hematocrit  Recent Labs    02/22/22 1212  04/27/22 1135 05/07/22 1850  HGB 14.0 11.7 10.7*    While in ER:    Ordered  CT HEAD *** NON acute  CXR - ***NON acute  CTabd/pelvis - ***nonacute  CTA chest - ***nonacute, no PE, * no evidence of infiltrate  Following Medications were ordered in ER: Medications  ipratropium-albuterol (DUONEB) 0.5-2.5 (3) MG/3ML nebulizer solution 3 mL (has no administration in time range)    _______________________________________________________ ER Provider Called:     DrMarland Kitchen  They Recommend admit to medicine *** Will see in AM  ***SEEN in ER   ED Triage Vitals  Enc Vitals Group     BP 05/07/22 1845 (!) 168/81     Pulse Rate 05/07/22 1845 79     Resp 05/07/22 1845 10     Temp 05/07/22 1845 98.5 F (36.9 C)     Temp Source 05/07/22 1845 Axillary     SpO2 05/07/22 1841 100 %     Weight 05/07/22 1841 241 lb (109.3 kg)     Height 05/07/22 1841 '5\' 2"'$  (1.575 m)     Head Circumference --      Peak Flow --      Pain Score 05/07/22 1841 0     Pain Loc --      Pain Edu? --      Excl. in [place]? --   TMAX(24)@     _________________________________________ Significant initial  Findings: Abnormal Labs Reviewed  CBC WITH DIFFERENTIAL/PLATELET - Abnormal; Notable for the following components:      Result Value   Hemoglobin 10.7 (*)    HCT 33.8 (*)    Platelets 134 (*)    All other components within normal limits  BRAIN NATRIURETIC PEPTIDE - Abnormal; Notable for the following components:   B Natriuretic Peptide 189.0 (*)    All other components within normal limits  BASIC METABOLIC PANEL - Abnormal; Notable for the following components:   Potassium 3.2 (*)    Glucose, Bld 140 (*)    All other components within normal limits     _________________________ Troponin ***ordered ECG: Ordered Personally reviewed and interpreted by me showing: HR : *** Rhythm: *NSR, Sinus tachycardia * A.fib. W RVR, RBBB, LBBB, Paced Ischemic changes*nonspecific changes, no evidence of ischemic  changes QTC*   ____________________ This patient meets SIRS Criteria and may be septic. SIRS = Systemic Inflammatory Response Syndrome  Order a lactic acid level if  needed AND/OR Initiate the sepsis protocol with the attached order set OR Click "Treating Associated Infection or Illness" if the patient is being treated for an infection that is a known cause of these abnormalities     The recent clinical data is shown below. Vitals:   05/07/22 1841 05/07/22 1845 05/07/22 2145 05/07/22 2152  BP:  (!) 168/81 (!) 142/65   Pulse:  79 86   Resp:  10 18   Temp:  98.5 F (36.9 C)  98.5 F (36.9 C)  TempSrc:  Axillary  Oral  SpO2: 100% 99% 95%   Weight: 109.3 kg     Height: '5\' 2"'$  (1.575 m)           WBC     Component Value Date/Time   WBC 7.0 05/07/2022 1850   LYMPHSABS 1.6 05/07/2022 1850   LYMPHSABS 1.6 05/26/2021 1218   MONOABS 0.8 05/07/2022 1850   EOSABS 0.2 05/07/2022 1850   EOSABS 0.4 05/26/2021 1218   BASOSABS 0.1 05/07/2022 1850   BASOSABS 0.1 05/26/2021 1218        Lactic Acid, Venous    Component Value Date/Time   LATICACIDVEN 1.7 10/25/2013 1222     Procalcitonin *** Ordered Lactic Acid, Venous    Component Value Date/Time   LATICACIDVEN 1.7 10/25/2013 1222     Procalcitonin *** Ordered   UA *** no evidence of UTI  ***Pending ***not ordered   Urine analysis:    Component Value Date/Time   COLORURINE YELLOW 11/13/2013 1501   APPEARANCEUR Clear 11/14/2017 1014   LABSPEC 1.018 11/13/2013 1501   PHURINE 8.0 11/13/2013 1501   GLUCOSEU Negative 11/14/2017 1014   GLUCOSEU NEG mg/dL 12/10/2007 2002   HGBUR NEG 11/13/2013 1501   BILIRUBINUR Negative 11/14/2017 1014   KETONESUR NEG 11/13/2013 1501   PROTEINUR Negative 11/14/2017 1014   PROTEINUR NEG 11/13/2013 1501   UROBILINOGEN 1 11/13/2013 1501   NITRITE Negative 11/14/2017 1014   NITRITE NEG 11/13/2013 1501   LEUKOCYTESUR Negative 11/14/2017 1014    Results for orders placed or  performed during the hospital encounter of 05/07/22  Resp panel by RT-PCR (RSV, Flu A&B, Covid) Anterior Nasal Swab     Status: None   Collection Time: 05/07/22  6:50 PM   Specimen: Anterior Nasal Swab  Result Value Ref Range Status   SARS Coronavirus 2 by RT PCR NEGATIVE NEGATIVE Final   Influenza A by PCR NEGATIVE NEGATIVE Final   Influenza B by PCR NEGATIVE NEGATIVE Final    Comment: (NOTE) The Xpert Xpress SARS-CoV-2/FLU/RSV plus assay is intended as an aid in the diagnosis of influenza from Nasopharyngeal swab specimens and should not be used as a sole basis for treatment. Nasal washings and aspirates are unacceptable for Xpert Xpress SARS-CoV-2/FLU/RSV testing.  Fact Sheet for Patients: EntrepreneurPulse.com.au  Fact Sheet for Healthcare Providers: IncredibleEmployment.be  This test is not yet approved or cleared by the [place] FDA and has been authorized for detection and/or diagnosis of SARS-CoV-2 by FDA under an Emergency Use Authorization (EUA). This EUA will remain in effect (meaning this test can be used) for the duration of the COVID-19 declaration under Section 564(b)(1) of the Act, 21 U.S.C. section 360bbb-3(b)(1), unless the authorization is terminated or revoked.     Resp Syncytial Virus by PCR NEGATIVE NEGATIVE Final    Comment: (NOTE) Fact Sheet for Patients: EntrepreneurPulse.com.au  Fact Sheet for Healthcare Providers: IncredibleEmployment.be  This test is not yet approved or cleared by the [place] FDA and has  been authorized for detection and/or diagnosis of SARS-CoV-2 by FDA under an Emergency Use Authorization (EUA). This EUA will remain in effect (meaning this test can be used) for the duration of the COVID-19 declaration under Section 564(b)(1) of the Act, 21 U.S.C. section 360bbb-3(b)(1), unless the authorization is terminated or revoked.  Performed at [facility], [place] [address], [place], [place] [address]      _______________________________________________ Hospitalist was called for admission for *** COPD exacerbation ([place]) ***  Acute respiratory failure with hypoxia ([place]) ***    The following Work up has been ordered so far:  Orders Placed This Encounter  Procedures   Resp panel by RT-PCR (RSV, Flu A&B, Covid) Anterior Nasal Swab   DG Chest Portable 1 View   CBC with Differential   Brain natriuretic peptide   Basic metabolic panel   Consult to hospitalist   ED EKG     OTHER Significant initial  Findings:  labs showing:    Recent Labs  Lab 05/07/22 2044  NA 139  K 3.2*  CO2 29  GLUCOSE 140*  BUN 10  CREATININE 0.71  CALCIUM 9.4    Cr  * stable,  Up from baseline see below Lab Results  Component Value Date   CREATININE 0.71 05/07/2022   CREATININE 0.66 05/26/2021   CREATININE 0.84 08/20/2020    No results for input(s): "AST", "ALT", "ALKPHOS", "BILITOT", "PROT", "ALBUMIN" in the last 168 hours. Lab Results  Component Value Date   CALCIUM 9.4 05/07/2022   PHOS 1.5 (L) 11/04/2013          Plt: Lab Results  Component Value Date   PLT 134 (L) 05/07/2022       COVID-19 Labs  No results for input(s): "DDIMER", "FERRITIN", "LDH", "CRP" in the last 72 hours.  Lab Results  Component Value Date   SARSCOV2NAA NEGATIVE 05/07/2022     Arterial ***Venous  Blood Gas result:  pH *** pCO2 ***; pO2 ***;     %O2 Sat ***.  ABG    Component Value Date/Time   TCO2 26 09/28/2009 1810         Recent Labs  Lab 05/07/22 1850  WBC 7.0  NEUTROABS 4.4  HGB 10.7*  HCT 33.8*  MCV 86.9  PLT 134*    HG/HCT * stable,  Down *Up from baseline see below    Component Value Date/Time   HGB 10.7 (L) 05/07/2022 1850   HGB 11.7 04/27/2022 1135   HCT 33.8 (L) 05/07/2022 1850   HCT 36.3 04/27/2022 1135   MCV 86.9 05/07/2022 1850   MCV 85 04/27/2022 1135      No results for input(s): "LIPASE",  "AMYLASE" in the last 168 hours. No results for input(s): "AMMONIA" in the last 168 hours.    Cardiac Panel (last 3 results) No results for input(s): "CKTOTAL", "CKMB", "TROPONINI", "RELINDX" in the last 72 hours.  .car BNP (last 3 results) Recent Labs    05/07/22 1850  BNP 189.0*      DM  labs:  HbA1C: Recent Labs    08/24/21 1046  HGBA1C 5.6       CBG (last 3)  No results for input(s): "GLUCAP" in the last 72 hours.        Cultures:    Component Value Date/Time   SDES URINE, RANDOM 10/31/2013 1120   SPECREQUEST ADDED AT 1735 ON [id number] 10/31/2013 1120   CULT NO GROWTH Performed at [facility] 10/31/2013 1120   REPTSTATUS 11/01/2013  FINAL 10/31/2013 1120     Radiological Exams on Admission: DG Chest Portable 1 View  Result Date: 05/07/2022 CLINICAL DATA:  Shortness of breath EXAM: PORTABLE CHEST 1 VIEW COMPARISON:  05/06/2021 FINDINGS: Cardiac shadow is mildly prominent but stable. Aortic calcifications are again seen. Elevation of left hemidiaphragm is noted. Increased central vascular congestion is noted without interstitial edema. IMPRESSION: Changes consistent with mild CHF. Electronically Signed   By: Inez Catalina M.D.   On: 05/07/2022 19:05   _______________________________________________________________________________________________________ Latest  Blood pressure (!) 142/65, pulse 86, temperature 98.5 F (36.9 C), temperature source Oral, resp. rate 18, height '5\' 2"'$  (1.575 m), weight 109.3 kg, SpO2 95 %.   Vitals  labs and radiology finding personally reviewed  Review of Systems:    Pertinent positives include: ***  Constitutional:  No weight loss, night sweats, Fevers, chills, fatigue, weight loss  HEENT:  No headaches, Difficulty swallowing,Tooth/dental problems,Sore throat,  No sneezing, itching, ear ache, nasal congestion, post nasal drip,  Cardio-vascular:  No chest pain, Orthopnea, PND, anasarca, dizziness, palpitations.no Bilateral  lower extremity swelling  GI:  No heartburn, indigestion, abdominal pain, nausea, vomiting, diarrhea, change in bowel habits, loss of appetite, melena, blood in stool, hematemesis Resp:  no shortness of breath at rest. No dyspnea on exertion, No excess mucus, no productive cough, No non-productive cough, No coughing up of blood.No change in color of mucus.No wheezing. Skin:  no rash or lesions. No jaundice GU:  no dysuria, change in color of urine, no urgency or frequency. No straining to urinate.  No flank pain.  Musculoskeletal:  No joint pain or no joint swelling. No decreased range of motion. No back pain.  Psych:  No change in mood or affect. No depression or anxiety. No memory loss.  Neuro: no localizing neurological complaints, no tingling, no weakness, no double vision, no gait abnormality, no slurred speech, no confusion  All systems reviewed and apart from [place] all are negative _______________________________________________________________________________________________ Past Medical History:   Past Medical History:  Diagnosis Date   Allergic rhinitis, cause unspecified    Asthma    Cancer ([place])    Chronic back pain    Coronary artery calcification seen on CAT scan 04/21/2017   Enlarged pulmonary artery ([place]) 04/21/2017   By chest CT   Frequent urination 11/14/2017   GERD (gastroesophageal reflux disease) 05/19/2011   history of Bilateral leg edema-likely amlodipine induced. 07/28/2011   history of CAP (community acquired pneumonia) 07/14/2011   history of HYPOTHYROIDISM, BORDERLINE 03/20/2006   Annotation: Asymptomatic, untreated Qualifier: Diagnosis of  By: Tomasa Hosteller MD, Edmon Crape.    Hx of breast cancer    Hyperlipidemia    Hypertension    Hypothyroidism    Lumbago    Murmur, cardiac 11/22/2016   OBESITY NOS 03/20/2006   Qualifier: Diagnosis of  By: Tomasa Hosteller MD, Veronique D.    OSTEOARTHRITIS 12/13/2005   Annotation: bilateral knees;right knee injection 6/05 Qualifier:  Diagnosis of  By: Tomasa Hosteller MD, Edmon Crape.    Pain, dental 09/18/2020   Preventative health care 09/04/2014   Pulmonary nodule 05/30/2017   4 mm right middle lobe nodule noted on high-resolution CT done on February 06, 2017   right shoulder pain, likely Impingement syndrome  09/09/2010   Upper respiratory infection 04/09/2018   VENOUS INSUFFICIENCY, CHRONIC 03/20/2006   Qualifier: Diagnosis of  By: Tomasa Hosteller MD, Edmon Crape.       Past Surgical History:  Procedure Laterality Date   ABDOMINAL HYSTERECTOMY     BREAST  LUMPECTOMY     COLONOSCOPY     HERNIA REPAIR     HIP CLOSED REDUCTION Right 11/26/2014   Procedure: CLOSED REDUCTIION RIGHT HIP;  Surgeon: Newt Minion, MD;  Location: [facility];  Service: Orthopedics;  Laterality: Right;   INCISIONAL HERNIA REPAIR N/A 10/29/2013   Procedure: OPEN REPAIR OF RECURRENT INCISIONAL HERNIA WITH MESH;  Surgeon: Zenovia Jarred, MD;  Location: [facility];  Service: General;  Laterality: N/A;   JOINT REPLACEMENT     TOTAL HIP ARTHROPLASTY      Social History:  Ambulatory *** independently cane, walker  wheelchair bound, bed bound     reports that she quit smoking about 63 years ago. Her smoking use included cigarettes. She has a 0.13 pack-year smoking history. She has never used smokeless tobacco. She reports that she does not drink alcohol and does not use drugs.     Family History: *** Family History  Problem Relation Age of Onset   Hypertension Mother    Alzheimer's disease Mother    Diabetes Sister    Colon cancer Neg Hx    Colon polyps Neg Hx    Esophageal cancer Neg Hx    Stomach cancer Neg Hx    Rectal cancer Neg Hx    ______________________________________________________________________________________________ Allergies: Allergies  Allergen Reactions   Acyclovir And Related    Metoprolol Itching   Food Rash and Other (See Comments)    Pt states that she is allergic to pickles.       Prior to Admission medications   Medication Sig  Start Date End Date Taking? Authorizing Provider  acetaminophen-codeine (TYLENOL #3) 300-30 MG tablet TAKE 1 TABLET EVERY 4 HOURS AS NEEDED FOR MODERATE PAIN. 04/27/22   Lottie Mussel, MD  albuterol (VENTOLIN HFA) 108 (90 Base) MCG/ACT inhaler INHALE 2 PUFFS INTO THE LUNGS EVERY 6 HOURS AS NEEDED FOR WHEEZING OR SHORTNESS OF BREATH. 03/04/22   Lottie Mussel, MD  amLODipine (NORVASC) 10 MG tablet TAKE 1 TABLET EVERY DAY 07/07/21   Aldine Contes, MD  aspirin EC 81 MG tablet Take 81 mg by mouth daily.    [provider]  atorvastatin (LIPITOR) 40 MG tablet TAKE 1 TABLET (40 MG TOTAL) BY MOUTH DAILY. 01/04/22   Aldine Contes, MD  BREO ELLIPTA 200-25 MCG/ACT AEPB INHALE 1 PUFF INTO THE LUNGS DAILY 09/20/21   Brand Males, MD  brimonidine (ALPHAGAN) 0.2 % ophthalmic solution Place 1 drop into the left eye daily. 07/20/21   [provider]  carvedilol (COREG) 25 MG tablet TAKE 1 TABLET BY MOUTH TWICE DAILY WITH A MEAL 01/04/22   Aldine Contes, MD  Cyanocobalamin (VITAMIN B12 PO) Take 1 tablet daily by mouth.    [provider]  diclofenac Sodium (VOLTAREN) 1 % GEL APPLY 2 GRAMS TOPICALLY 4 TIMES DAILY 01/14/22   Aldine Contes, MD  dorzolamide-timolol (COSOPT) 22.3-6.8 MG/ML ophthalmic solution Place 1 drop into the left eye daily. 07/20/21   [provider]  latanoprost (XALATAN) 0.005 % ophthalmic solution Place 1 drop into the left eye daily. 06/26/21   [provider]  levothyroxine (SYNTHROID) 25 MCG tablet TAKE 1/2 TABLET EVERY DAY BEFORE BREAKFAST 01/04/22   Aldine Contes, MD  losartan (COZAAR) 100 MG tablet TAKE 1 TABLET (100 MG TOTAL) BY MOUTH DAILY. 12/07/21   Aldine Contes, MD  pantoprazole (PROTONIX) 40 MG tablet TAKE 1 TABLET (40 MG TOTAL) BY MOUTH DAILY. 01/04/22   Aldine Contes, MD    ___________________________________________________________________________________________________ Physical Exam:    05/07/2022  9:45  PM 05/07/2022    6:45 PM 05/07/2022    6:41 PM  Vitals with BMI  Height   '5\' 2"'$   Weight   241 lbs  BMI   0000000  Systolic [id number]   Diastolic 65 81   Pulse 86 79      1. General:  in No ***Acute distress***increased work of breathing ***complaining of severe pain****agitated * Chronically ill *well *cachectic *toxic acutely ill -appearing 2. Psychological: Alert and *** Oriented 3. Head/ENT:   Moist *** Dry Mucous Membranes                          Head Non traumatic, neck supple                          Normal *** Poor Dentition 4. SKIN: normal *** decreased Skin turgor,  Skin clean Dry and intact no rash 5. Heart: Regular rate and rhythm no*** Murmur, no Rub or gallop 6. Lungs: ***Clear to auscultation bilaterally, no wheezes or crackles   7. Abdomen: Soft, ***non-tender, Non distended *** obese ***bowel sounds present 8. Lower extremities: no clubbing, cyanosis, no ***edema 9. Neurologically Grossly intact, moving all 4 extremities equally *** strength 5 out of 5 in all 4 extremities cranial nerves II through XII intact 10. MSK: Normal range of motion    Chart has been reviewed  ______________________________________________________________________________________________  Assessment/Plan  ***  Admitted for *** COPD exacerbation (HCC) ***  Acute respiratory failure with hypoxia ([place]) ***    Present on Admission: **None**     No problem-specific Assessment & Plan notes found for this encounter.    Other plan as per orders.  DVT prophylaxis:  SCD *** Lovenox       Code Status:    Code Status: Prior FULL CODE *** DNR/DNI ***comfort care as per patient ***family  I had personally discussed CODE STATUS with patient and family* I had spent *min discussing goals of care and CODE STATUS    Family Communication:   Family not at  Bedside  plan of care was discussed on the phone with *** Son, Daughter, Wife, Husband, Sister, Brother , father, mother  Disposition  Plan:   *** likely will need placement for rehabilitation                          Back to current facility when stable                            To home once workup is complete and patient is stable  ***Following barriers for discharge:                            Electrolytes corrected                               Anemia corrected                             Pain controlled with PO medications                               Afebrile, white count improving able to  transition to PO antibiotics                             Will need to be able to tolerate PO                            Will likely need home health, home O2, set up                           Will need consultants to evaluate patient prior to discharge  ****EXPECT DC tomorrow                    ***Would benefit from PT/OT eval prior to DC  Ordered                   Swallow eval - SLP ordered                   Diabetes care coordinator                   Transition of care consulted                   Nutrition    consulted                  Wound care  consulted                   Palliative care    consulted                   Behavioral health  consulted                    Consults called: ***    Admission status:  ED Disposition     ED Disposition  Admit   Condition  --   Comment  The patient appears reasonably stabilized for admission considering the current resources, flow, and capabilities available in the ED at this time, and I doubt any other [facility] requiring further screening and/or treatment in the ED prior to admission is  present.           Obs***  ***  inpatient     I Expect 2 midnight stay secondary to severity of patient's current illness need for inpatient interventions justified by the following: ***hemodynamic instability despite optimal treatment (tachycardia *hypotension * tachypnea *hypoxia, hypercapnia) * Severe lab/radiological/exam abnormalities including:     and extensive comorbidities  including: *substance abuse  *Chronic pain *DM2  * CHF * CAD  * COPD/asthma *Morbid Obesity * CKD *dementia *liver disease *history of stroke with residual deficits *  malignancy, * sickle cell disease  History of amputation Chronic anticoagulation  That are currently affecting medical management.   I expect  patient to be hospitalized for 2 midnights requiring inpatient medical care.  Patient is at high risk for adverse outcome (such as loss of life or disability) if not treated.  Indication for inpatient stay as follows:  Severe change from baseline regarding mental status Hemodynamic instability despite maximal medical therapy,  ongoing suicidal ideations,  severe pain requiring acute inpatient management,  inability to maintain oral hydration   persistent chest pain despite medical management Need for operative/procedural  intervention New or worsening hypoxia   Need for IV antibiotics, IV fluids, IV rate controling  medications, IV antihypertensives, IV pain medications, IV anticoagulation, need for biPAP    Level of care   *** tele  For 12H 24H     medical floor       progressive tele indefinitely please discontinue once patient no longer qualifies COVID-19 Labs    Lab Results  Component Value Date   [place] NEGATIVE 05/07/2022     Precautions: admitted as *** Covid Negative  ***asymptomatic screening protocol****PUI *** covid positive No active isolations ***If Covid PCR is negative  - please DC precautions - would need additional investigation given very high risk for false native test result    Critical***  Patient is critically ill due to  hemodynamic instability * respiratory failure *severe sepsis* ongoing chest pain*  They are at high risk for life/limb threatening clinical deterioration requiring frequent reassessment and modifications of care.  Services provided include examination of the patient, review of relevant ancillary tests,  prescription of lifesaving therapies, review of medications and prophylactic therapy.  Total critical care time excluding separately billable procedures: 60*  Minutes.    Bubber Rothert 05/07/2022, 10:11 PM ***  [facility]     after 2 AM please page floor coverage PA If 7AM-7PM, please contact the day team taking care of the patient using [email or web address]   Patient was evaluated in the context of the global COVID-19 pandemic, which necessitated consideration that the patient might be at risk for infection with the SARS-CoV-2 virus that causes COVID-19. Institutional protocols and algorithms that pertain to the evaluation of patients at risk for COVID-19 are in a state of rapid change based on information released by regulatory bodies including the CDC and federal and state organizations. These policies and algorithms were followed during the patient's care.

## 2022-05-07 NOTE — ED Triage Notes (Signed)
Pt BIB [facility] from home due to [facility] that started Wednesday 05/04/22.  Pt has hx of COPD.  20g left forearm.  '10mg'$  Albuterol, '1mg'$  Atrovent and '125mg'$  Solumedrol given en route.  VS BP 150/86, HR 80, SpO2 96%, CBG 120.

## 2022-05-07 NOTE — ED Provider Notes (Signed)
[name]   CSN: [id number] Arrival date & time: 05/07/22  [id number]     History  Chief Complaint  Patient presents with   Shortness of [name] is a 87 y.o. female.  Patient here with shortness of breath for the last few days.  History of COPD and interstitial lung disease on 2 L of oxygen chronically.  Has been doing breathing treatments at home without much relief.  Got 10 mill grams of albuterol, 1 mg Atrovent and 125 of Solu-Medrol with EMS.  She had normal vitals.  She states that she might had a fever couple days ago.  She states that she has a heart murmur but denies heart failure, CAD.  Denies any chest pain, leg swelling, abdominal pain, nausea, vomiting, diarrhea.  The history is provided by the patient.       Home Medications Prior to Admission medications   Medication Sig Start Date End Date Taking? Authorizing Provider  acetaminophen-codeine (TYLENOL #3) 300-30 MG tablet TAKE 1 TABLET EVERY 4 HOURS AS NEEDED FOR MODERATE PAIN. 04/27/22   Lottie Mussel, MD  albuterol (VENTOLIN HFA) 108 (90 Base) MCG/ACT inhaler INHALE 2 PUFFS INTO THE LUNGS EVERY 6 HOURS AS NEEDED FOR WHEEZING OR SHORTNESS OF BREATH. 03/04/22   Lottie Mussel, MD  amLODipine (NORVASC) 10 MG tablet TAKE 1 TABLET EVERY DAY 07/07/21   Aldine Contes, MD  aspirin EC 81 MG tablet Take 81 mg by mouth daily.    [provider]  atorvastatin (LIPITOR) 40 MG tablet TAKE 1 TABLET (40 MG TOTAL) BY MOUTH DAILY. 01/04/22   Aldine Contes, MD  BREO ELLIPTA 200-25 MCG/ACT AEPB INHALE 1 PUFF INTO THE LUNGS DAILY 09/20/21   Brand Males, MD  brimonidine (ALPHAGAN) 0.2 % ophthalmic solution Place 1 drop into the left eye daily. 07/20/21   [provider]  carvedilol (COREG) 25 MG tablet TAKE 1 TABLET BY MOUTH TWICE DAILY WITH A MEAL 01/04/22   Aldine Contes, MD  Cyanocobalamin (VITAMIN B12 PO) Take 1 tablet daily by mouth.     [provider]  diclofenac Sodium (VOLTAREN) 1 % GEL APPLY 2 GRAMS TOPICALLY 4 TIMES DAILY 01/14/22   Aldine Contes, MD  dorzolamide-timolol (COSOPT) 22.3-6.8 MG/ML ophthalmic solution Place 1 drop into the left eye daily. 07/20/21   [provider]  latanoprost (XALATAN) 0.005 % ophthalmic solution Place 1 drop into the left eye daily. 06/26/21   [provider]  levothyroxine (SYNTHROID) 25 MCG tablet TAKE 1/2 TABLET EVERY DAY BEFORE BREAKFAST 01/04/22   Aldine Contes, MD  losartan (COZAAR) 100 MG tablet TAKE 1 TABLET (100 MG TOTAL) BY MOUTH DAILY. 12/07/21   Aldine Contes, MD  pantoprazole (PROTONIX) 40 MG tablet TAKE 1 TABLET (40 MG TOTAL) BY MOUTH DAILY. 01/04/22   Aldine Contes, MD      Allergies    Acyclovir and related, Metoprolol, and Food    Review of Systems   Review of Systems  Physical Exam Updated Vital Signs BP (!) 168/81 (BP Location: Right Arm)   Pulse 79   Temp 98.5 F (36.9 C) (Axillary)   Resp 10   Ht '5\' 2"'$  (1.575 m)   Wt 109.3 kg   SpO2 99%   BMI 44.08 kg/m  Physical Exam Vitals and nursing [name] reviewed.  Constitutional:      General: She is not in acute distress.    Appearance: She is well-developed. She is not ill-appearing.  HENT:     Head: Normocephalic and atraumatic.     Mouth/Throat:     Mouth: Mucous membranes are moist.  Eyes:     Conjunctiva/sclera: Conjunctivae normal.     Pupils: Pupils are equal, round, and reactive to light.  Cardiovascular:     Rate and Rhythm: Normal rate and regular rhythm.     Pulses: Normal pulses.     Heart sounds: Normal heart sounds. No murmur heard. Pulmonary:     Effort: Pulmonary effort is normal. Tachypnea present. No respiratory distress.     Breath sounds: Decreased breath sounds present.  Abdominal:     Palpations: Abdomen is soft.     Tenderness: There is no abdominal tenderness.  Musculoskeletal:        General: No swelling.     Cervical back: Normal range  of motion and neck supple.     Right lower leg: No edema.     Left lower leg: No edema.  Skin:    General: Skin is warm and dry.     Capillary Refill: Capillary refill takes less than 2 seconds.  Neurological:     Mental Status: She is alert.  Psychiatric:        Mood and Affect: Mood normal.     ED Results / Procedures / Treatments   Labs (all labs ordered are listed, but only abnormal results are displayed) Labs Reviewed  CBC WITH DIFFERENTIAL/PLATELET - Abnormal; Notable for the following components:      Result Value   Hemoglobin 10.7 (*)    HCT 33.8 (*)    Platelets 134 (*)    All other components within normal limits  BRAIN NATRIURETIC PEPTIDE - Abnormal; Notable for the following components:   B Natriuretic Peptide 189.0 (*)    All other components within normal limits  RESP PANEL BY RT-PCR (RSV, FLU A&B, COVID)  RVPGX2  BASIC METABOLIC PANEL  TROPONIN I (HIGH SENSITIVITY)  TROPONIN I (HIGH SENSITIVITY)    EKG None  Radiology DG Chest Portable 1 View  Result Date: 05/07/2022 CLINICAL DATA:  Shortness of breath EXAM: PORTABLE CHEST 1 VIEW COMPARISON:  05/06/2021 FINDINGS: Cardiac shadow is mildly prominent but stable. Aortic calcifications are again seen. Elevation of left hemidiaphragm is noted. Increased central vascular congestion is noted without interstitial edema. IMPRESSION: Changes consistent with mild CHF. Electronically Signed   By: Inez Catalina M.D.   On: 05/07/2022 19:05    Procedures Procedures    Medications Ordered in ED Medications  ipratropium-albuterol (DUONEB) 0.5-2.5 (3) MG/3ML nebulizer solution 3 mL (has no administration in time range)    ED Course/ Medical Decision Making/ A&P                             Medical Decision Making Amount and/or Complexity of Data Reviewed Labs: ordered. Radiology: ordered.  Risk Prescription drug management. Decision regarding hospitalization.   [name] is here shortness of breath.  She is  on nonrebreather getting continuous albuterol started by EMS.  Vital signs otherwise unremarkable.  She is got decreased breath sounds with wheezing but she is conversational and no signs of major respiratory distress.  History of COPD and chronic interstitial lung disease.  She is on 2 L of oxygen at baseline.  Differential diagnosis likely COPD exacerbation versus less likely heart failure/CAD/ACS.  Have no concern for PE.  No signs of volume overload on exam.  Sounds like this could be  a viral process as well as may be fever couple days ago.  Will check CBC, BMP, troponin, BNP, COVID and flu test and chest x-ray.  EKG per my review and interpretation shows sinus rhythm with no ischemic changes.  Will reevaluate after she finishes her breathing treatment.  Chest x-ray.  Does not show any obvious pneumonia.  Radiology report suspects may be some edema.  Per further review and interpretation the labs shows no significant leukocytosis or anemia or electrolyte abnormality.  COVID and flu test are negative.  Troponin is 5.  BNP is 189.  Overall suspect that this is reactive airway/COPD exacerbation.  She does have a significant murmur.  Not sure if there is a little bit of element of some pulmonary hypertension, volume overload.  Will give her another DuoNeb.  She has seemed to improve since getting continuous nebulizer.  Will admit for observation.  Slightly increased oxygen requirement from her normal.  This chart was dictated using voice recognition software.  Despite best efforts to proofread,  errors can occur which can change the documentation meaning.         Final Clinical Impression(s) / ED Diagnoses Final diagnoses:  COPD exacerbation ([place])  Acute respiratory failure with hypoxia [facility])    Rx / DC Orders ED Discharge Orders     None         Lennice Sites, DO 05/07/22 2146

## 2022-05-07 NOTE — Subjective & Objective (Signed)
Progressive SOB tried to use albuterol at home with no improvement  90% on 2L tht is her baseline  Responded better to cont neb by EMS Was also given solumedrol  125 mg

## 2022-05-07 NOTE — H&P (Signed)
Date: 05/08/2022         Patient Name:  [name] MRN: [id number]  DOB: 05/10/1935 Age / Sex: 87 y.o., female   PCP: Lottie Mussel, MD         Medical Service: Internal Medicine Teaching Service         Attending Physician: Dr. Velna Ochs, MD    First Contact: Dr. Vergia Alberts Pager: [phone]  Second Contact: Dr. Elliot Gurney Pager: [phone]       After Hours (After 5p/  First Contact Pager: [phone]  weekends / holidays): Second Contact Pager: [phone]   Chief Concern: Difficulty breathing Indigestion  History of Present Illness: 87 year old female with history of restrictive lung disease presents with worsening shortness of breath over the last week or so.  She describes using her home supplemental oxygen more frequently than usual.  She has been short of breath lying flat, and waking up at night feeling winded, both of which are unusual for her.  Today, she felt especially wheezy and used her home inhalers several times.  Her shortness of breath continued to bother her until she decided to go to the emergency department around 6 PM on 05/07/2022.  EMS administered DuoNeb and Solu-Medrol.  O2 saturations maintained on 6 L via facemask.  At time of my interview she is breathing comfortably on 2 L via nasal cannula.  She also reports some chest discomfort, described as indigestion.  She woke up with this today.  It feels like a burning, similar to her heartburn.  It is worse when lying down.  It does not radiate.  In the ED, treatment was continued with DuoNeb and Solu-Medrol.  Review of Systems  Constitutional:  Negative for chills, diaphoresis and fever.  Respiratory:  Positive for cough (Productive of gray mucus) and wheezing.   Cardiovascular:  Positive for orthopnea, leg swelling and PND. Negative for palpitations.  Gastrointestinal:  Positive for constipation and heartburn. Negative for abdominal pain, diarrhea, nausea and vomiting.  Genitourinary:  Positive for frequency.    Medications: Acetaminophen-codeine Albuterol Amlodipine 10 mg Aspirin 81 mg Atorvastatin 40 mg Fluticasone furoate-vilanterol Carvedilol 25 mg twice daily Levothyroxine 25 mcg Losartan 100 mg Pantoprazole 40 mg daily  Allergies: Allergies  Allergen Reactions   Acyclovir And Related    Metoprolol Itching   Food Rash and Other (See Comments)    Pt states that she is allergic to pickles.      Past Medical History: Restrictive lung disease Aortic stenosis GERD Hypertension CAD Hypothyroidism  Surgical History: Hernia surgery Right hip replacement Surgery for breast cancer  Family History:  Strokes and heart attacks in 2 daughters but none on her side of the family  Social History:  Lives in [place] with daughter and great grandchild.  Used to work in a [employer].  Enjoys an independent lifestyle.  Does not smoke, drink alcohol, or use recreational drugs.  Physical Exam: Blood pressure (!) 130/58, pulse 87, temperature 98.5 F (36.9 C), temperature source Oral, resp. rate 18, height '5\' 2"'$  (1.575 m), weight 109.3 kg, SpO2 97 %.  No acute distress Oral mucous membranes moist Heart rate and rhythm are regular, loud systolic murmur, bilateral radial and DP pulses 2+, JVD appreciated, bilateral lower extremity edema most notable in feet Breathing is regular and unlabored on 2 L via nasal cannula, no crackles appreciated, scant expiratory wheeze in anterior lung fields Abdomen is soft and nontender Skin is warm and dry No gross neurologic deficits Pleasant, mood and affect  are concordant  EKG:  Sinus rhythm, no findings suggestive of ischemia  Labs: Sodium 139 Potassium 3.2 Creatinine 0.71 BNP 189 Troponin 5, 7 WBC 7 Hemoglobin 10.7 HCT 33.8 Platelets 134 Flu, RSV, COVID negative  Images and other studies: CXR with left hemidiaphragm elevation, low lung volumes, pulmonary vascular congestion  Assessment & Plan:  [name] is a 87 y.o. with a history  of restrictive lung disease and aortic stenosis who presents with acute on chronic hypoxic respiratory failure and volume overload concerning for mild acute decompensated heart failure.  Principal Problem:   Acute on chronic hypoxic respiratory failure (HCC) Active Problems:   Hypothyroidism   Severe obesity (BMI >= 40) (HCC)   Essential hypertension   Restrictive lung disease   Elevated hemidiaphragm   OSA (obstructive sleep apnea)   Moderate aortic stenosis   CHF exacerbation (HCC)  Acute on chronic hypoxic respiratory failure Volume overload Treating as acute decompensated heart failure.  She may have symptomatic aortic stenosis complicated by pulmonary hypertension in setting of chronic interstitial lung disease.  Signs and symptoms of volume overload include orthopnea, PND, JVD, lower extremity edema.  Not wheezing on exam although she has had several breathing treatments prior to my assessment.  Low index of suspicion for COPD exacerbation or reactive airway disease.  No signs of bacterial pneumonia.  Flu, RSV, and COVID-negative but cannot rule out another respiratory virus.  She may also have an element of obesity hypoventilation syndrome suggested by body habitus and low lung volumes on CXR.  Tempo more consistent with subacute or worsening chronic process than something like PE or ACS.  Don't suspect nephrotic syndrome or cirrhosis. - Furosemide 40 mg IV - Monitor ins and outs - Echo - A.m. BMP and mag - O2 to maintain saturation greater than 92%  Aortic stenosis Loud murmur, radiating all over the chest and up the neck.  Moderate aortic stenosis on May 2023 echo.  Today, denies fainting, lightheadedness, dizziness.  Do not think chest pain is cardiac.  Dyspnea may very well be related. - Repeat echo - Telemetry x 24 hours  Restrictive lung disease Some evidence of pulmonary fibrosis on imaging, may be with a component of obesity hypoventilation syndrome.  PFTs also notable for  obstructive component and impaired diffusion.  Again, less suspicious for COPD exacerbation or reactive airway disease but rather restrictive lung disease and possibly pulmonary hypertension compounding this patient's heart failure syndrome. - Discontinue steroids - DuoNebs as needed  Chest pain Treating as heartburn.  Symptoms consistent with prior episodes of heartburn.  Reassuring EKG and troponin.  History of CAD based on coronary artery calcium seen on imaging.  Appropriately treated with aspirin and statin. - Continue home pantoprazole - Calcium carbonate.  History of hypothyroidism Recent TSH around 4.290. - Continue home Synthroid 25 mcg  History of hypertension Hypertensive in ED.  Continue home beta-blocker. - Carvedilol 25 mg twice daily - Holding home losartan  History of OSA - Nightly CPAP  History of CAD - Aspirin 81 mg - Atorvastatin 40 mg  Diet: Regular IVF: None, diuresing VTE: Enoxaparin 60 mg Code: Full Surrogate: Son, [name]  Admit patient to Inpatient with expected length of stay greater than 2 midnights.  Signed: Nani Gasser MD 05/08/2022, 12:04 AM  Pager: [phone] After 5pm on weekdays and 1pm on weekends: [phone]

## 2022-05-08 ENCOUNTER — Encounter (HOSPITAL_COMMUNITY): Payer: Self-pay | Admitting: Internal Medicine

## 2022-05-08 DIAGNOSIS — J45909 Unspecified asthma, uncomplicated: Secondary | ICD-10-CM

## 2022-05-08 DIAGNOSIS — J9621 Acute and chronic respiratory failure with hypoxia: Secondary | ICD-10-CM

## 2022-05-08 DIAGNOSIS — Z87891 Personal history of nicotine dependence: Secondary | ICD-10-CM

## 2022-05-08 DIAGNOSIS — E8770 Fluid overload, unspecified: Secondary | ICD-10-CM

## 2022-05-08 DIAGNOSIS — I509 Heart failure, unspecified: Secondary | ICD-10-CM

## 2022-05-08 LAB — CBC
HCT: 33.1 % — ABNORMAL LOW (ref 36.0–46.0)
Hemoglobin: 11 g/dL — ABNORMAL LOW (ref 12.0–15.0)
MCH: 27.8 pg (ref 26.0–34.0)
MCHC: 33.2 g/dL (ref 30.0–36.0)
MCV: 83.6 fL (ref 80.0–100.0)
Platelets: 197 10*3/uL (ref 150–400)
RBC: 3.96 MIL/uL (ref 3.87–5.11)
RDW: 13.4 % (ref 11.5–15.5)
WBC: 6.1 10*3/uL (ref 4.0–10.5)
nRBC: 0 % (ref 0.0–0.2)

## 2022-05-08 LAB — RESPIRATORY PANEL BY PCR

## 2022-05-08 LAB — BASIC METABOLIC PANEL
Anion gap: 12 (ref 5–15)
BUN: 12 mg/dL (ref 8–23)
CO2: 28 mmol/L (ref 22–32)
Calcium: 9.4 mg/dL (ref 8.9–10.3)
Chloride: 101 mmol/L (ref 98–111)
Creatinine, Ser: 0.84 mg/dL (ref 0.44–1.00)
GFR, Estimated: >60 mL/min (ref >60)
Glucose, Bld: 165 mg/dL — ABNORMAL HIGH (ref 70–99)
Potassium: 3.6 mmol/L (ref 3.5–5.1)
Sodium: 141 mmol/L (ref 135–145)

## 2022-05-08 LAB — PHOSPHORUS: Phosphorus: 3 mg/dL (ref 2.5–4.6)

## 2022-05-08 LAB — MAGNESIUM: Magnesium: 1.7 mg/dL (ref 1.7–2.4)

## 2022-05-08 MED ORDER — FUROSEMIDE 10 MG/ML IJ SOLN
40.0000 mg | Freq: Two times a day (BID) | INTRAMUSCULAR | Status: DC
  Administered 2022-05-08: 40 mg via INTRAVENOUS
  Filled 2022-05-08: qty 4

## 2022-05-08 MED ORDER — ACETAMINOPHEN-CODEINE 300-30 MG PO TABS
1.0000 | ORAL_TABLET | ORAL | Status: DC | PRN
  Administered 2022-05-08 – 2022-05-11 (×3): 1 via ORAL
  Filled 2022-05-08 (×3): qty 1

## 2022-05-08 MED ORDER — ENOXAPARIN SODIUM 60 MG/0.6ML IJ SOSY
60.0000 mg | PREFILLED_SYRINGE | INTRAMUSCULAR | Status: DC
  Administered 2022-05-08 – 2022-05-11 (×4): 60 mg via SUBCUTANEOUS
  Filled 2022-05-08 (×4): qty 0.6

## 2022-05-08 MED ORDER — BRIMONIDINE TARTRATE 0.2 % OP SOLN
1.0000 [drp] | Freq: Every day | OPHTHALMIC | Status: DC
  Administered 2022-05-08 – 2022-05-11 (×4): 1 [drp] via OPHTHALMIC
  Filled 2022-05-08: qty 5

## 2022-05-08 MED ORDER — FUROSEMIDE 10 MG/ML IJ SOLN
40.0000 mg | Freq: Every day | INTRAMUSCULAR | Status: DC
  Administered 2022-05-08: 40 mg via INTRAVENOUS
  Filled 2022-05-08: qty 4

## 2022-05-08 MED ORDER — ENSURE MAX PROTEIN PO LIQD
11.0000 [oz_av] | Freq: Every day | ORAL | Status: DC
  Administered 2022-05-08: 11 [oz_av] via ORAL
  Filled 2022-05-08 (×2): qty 330

## 2022-05-08 MED ORDER — PREDNISONE 20 MG PO TABS
40.0000 mg | ORAL_TABLET | Freq: Every day | ORAL | Status: DC
  Administered 2022-05-08 – 2022-05-09 (×2): 40 mg via ORAL
  Filled 2022-05-08 (×2): qty 2

## 2022-05-08 NOTE — Progress Notes (Signed)
Initial Nutrition Assessment  DOCUMENTATION CODES:   Obesity unspecified  INTERVENTION:   -Ensure MAX Protein po BID, each supplement provides 150 kcal and 30 grams of protein   NUTRITION DIAGNOSIS:   Increased nutrient needs related to chronic illness as evidenced by estimated needs.  GOAL:   Patient will meet greater than or equal to 90% of their needs  MONITOR:   PO intake, Supplement acceptance, Labs, Weight trends, I & O's  REASON FOR ASSESSMENT:   Consult Assessment of nutrition requirement/status  ASSESSMENT:   87 y.o. with a history of restrictive lung disease and aortic stenosis who presents with acute on chronic hypoxic respiratory failure and volume overload concerning for acute decompensated heart failure.  Patient currently consuming 20-30% of meals at this time. Pt having indigestion. Would benefit from protein supplements, will order.  Per weight records, pt's weight has increased since December 2023. Per nursing documentation, pt with mild BLE edema.   Medications: Lasix, KLOR-CON  Labs reviewed.  NUTRITION - FOCUSED PHYSICAL EXAM:  Unable to complete, RD working remotely.   Diet Order:   Diet Order             Diet Heart Room service appropriate? Yes; Fluid consistency: Thin  Diet effective now                   EDUCATION NEEDS:   Not appropriate for education at this time  Skin:  Skin Assessment: Reviewed RN Assessment  Last BM:  3/2  Height:   Ht Readings from Last 1 Encounters:  05/07/22 '5\' 2"'$  (1.575 m)    Weight:   Wt Readings from Last 1 Encounters:  05/08/22 113.9 kg    BMI:  Body mass index is 45.93 kg/m.  Estimated Nutritional Needs:   Kcal:  1400-1600  Protein:  75-85g  Fluid:  1.6L/day  Natalie Bibles, MS, RD, LDN Inpatient Clinical Dietitian Contact information available via Amion

## 2022-05-08 NOTE — Evaluation (Signed)
Physical Therapy Evaluation Patient Details Name: [name] MRN: [id number] DOB: June 14, 1935 Today's Date: 05/08/2022  History of Present Illness  Pt is an 87 y.o. female who presented 05/07/22 with worsening SOB. Admitted with acute on chronic hypoxic respiratory failure and volume overload concerning for mild acute decompensated heart failure. PMH: asthma, cancer, HLD, HTN, hypothyroidism, murmur, OA, restrictive lung disease   Clinical Impression  Pt presents with condition above and deficits mentioned below, see PT Problem List. PTA, she was mod I primarily using her cane for functional mobility but intermittently using her RW. She lives in a 1-level apartment with a level entry with her great grandson (83 y.o.) and daughter, who has had multiple CVAs and has an aide herself. Pt endorses having more difficulty recently with household chores. Currently, pt displays deficits in balance, strength, power, and activity tolerance/aerobic endurance. She did not display any LOB, but min guard assist was provided for safety with transfers and when ambulating with a RW today. She required 1L O2 at rest and 3L O2 when ambulating to maintain sats >/= 91%. Recommending HHPT with [place] services upon d/c. Will continue to follow acutely.       Recommendations for follow up therapy are one component of a multi-disciplinary discharge planning process, led by the attending physician.  Recommendations may be updated based on patient status, additional functional criteria and insurance authorization.  Follow Up Recommendations Home health PT (& [facility] services)      Assistance Recommended at Discharge Intermittent Supervision/Assistance  Patient can return home with the following  Assistance with cooking/housework;Assist for transportation    Equipment Recommendations Rollator (4 wheels)  Recommendations for Other Services       Functional Status Assessment Patient has had a recent decline in their functional  status and demonstrates the ability to make significant improvements in function in a reasonable and predictable amount of time.     Precautions / Restrictions Precautions Precautions: Fall;Other (comment) Precaution Comments: watch SpO2 (2L baseline) Restrictions Weight Bearing Restrictions: No      Mobility  Bed Mobility               General bed mobility comments: Pt sitting in recliner upon arrival.    Transfers Overall transfer level: Needs assistance Equipment used: Rolling walker (2 wheels) Transfers: Sit to/from Stand Sit to Stand: Min guard           General transfer comment: Extra time to power up to stand, no LOB, min guard for safety    Ambulation/Gait Ambulation/Gait assistance: Min guard Gait Distance (Feet): 120 Feet Assistive device: Rolling walker (2 wheels) Gait Pattern/deviations: Step-through pattern, Decreased stride length Gait velocity: reduced Gait velocity interpretation: <1.8 ft/sec, indicate of risk for recurrent falls   General Gait Details: Pt with slow, but fairly steady gait without LOB, min guard assist for safety. Cues provided for activity pacing.  Stairs            Wheelchair Mobility    Modified Rankin (Stroke Patients Only)       Balance Overall balance assessment: Needs assistance Sitting-balance support: No upper extremity supported, Feet supported Sitting balance-Leahy Scale: Good     Standing balance support: Bilateral upper extremity supported, During functional activity Standing balance-Leahy Scale: Poor Standing balance comment: Reliant on RW to ambulate                             Pertinent Vitals/Pain Pain Assessment Pain Assessment:  No/denies pain    Home Living Family/patient expects to be discharged to:: Private residence Living Arrangements: Other relatives (daughter and great grandson) Available Help at Discharge: Family;Available PRN/intermittently (daughter disabled from  multiple CVAs and needs an aide herself; great grandson goes to school, 94 y.o.) Type of Home: Apartment Home Access: Level entry       Home Layout: One level Home Equipment: Advice worker (2 wheels);Cane - single point Additional Comments: 2L O2 at baseline    Prior Function Prior Level of Function : Independent/Modified Independent             Mobility Comments: Uses cane majority of time, intermittently uses RW depending on balance; no falls in past 6 months ADLs Comments: Does not drive, son or SCAT provides transportation; does cooking and cleaning but has been having difficulty with it recently     Hand Dominance   Dominant Hand: Right    Extremity/Trunk Assessment   Upper Extremity Assessment Upper Extremity Assessment: Defer to OT evaluation    Lower Extremity Assessment Lower Extremity Assessment: Generalized weakness    Cervical / Trunk Assessment Cervical / Trunk Assessment: Normal  Communication   Communication: No difficulties  Cognition Arousal/Alertness: Awake/alert Behavior During Therapy: WFL for tasks assessed/performed Overall Cognitive Status: Within Functional Limits for tasks assessed                                          General Comments General comments (skin integrity, edema, etc.): SpO2 92% on 1L at rest, down to 88% on RA at rest, needing 3L to maintain sats >/= 91% when ambulating    Exercises     Assessment/Plan    PT Assessment Patient needs continued PT services  PT Problem List Decreased strength;Decreased activity tolerance;Decreased balance;Decreased mobility;Cardiopulmonary status limiting activity       PT Treatment Interventions DME instruction;Gait training;Functional mobility training;Therapeutic activities;Therapeutic exercise;Balance training;Neuromuscular re-education;Patient/family education    PT Goals (Current goals can be found in the Care Plan section)  Acute Rehab PT  Goals Patient Stated Goal: to get some occasional assistance at home PT Goal Formulation: With patient/family Time For Goal Achievement: 05/22/22 Potential to Achieve Goals: Good    Frequency Min 3X/week     Co-evaluation               AM-PAC PT "6 Clicks" Mobility  Outcome Measure Help needed turning from your back to your side while in a flat bed without using bedrails?: None Help needed moving from lying on your back to sitting on the side of a flat bed without using bedrails?: A Little Help needed moving to and from a bed to a chair (including a wheelchair)?: A Little Help needed standing up from a chair using your arms (e.g., wheelchair or bedside chair)?: A Little Help needed to walk in hospital room?: A Little Help needed climbing 3-5 steps with a railing? : A Little 6 Click Score: 19    End of Session Equipment Utilized During Treatment: Gait belt;Oxygen Activity Tolerance: Patient tolerated treatment well Patient left: in chair;with call bell/phone within reach;with family/visitor present   PT Visit Diagnosis: Unsteadiness on feet (R26.81);Other abnormalities of gait and mobility (R26.89);Muscle weakness (generalized) (M62.81)    Time: [id number] PT Time Calculation (min) (ACUTE ONLY): 20 min   Charges:   PT Evaluation $PT Eval Moderate Complexity: 1 Mod  Moishe Spice, PT, DPT Acute Rehabilitation Services  Office: [place] 05/08/2022, 5:12 PM

## 2022-05-08 NOTE — Progress Notes (Signed)
Pt. Ambulated on R/A, O2 sat 83% with increased SOB and wheezing, 2 L applied and O2 sat increasing to 92% while ambulating, HR 85, will cont. To monitor  Anastasio Auerbach

## 2022-05-08 NOTE — H&P (Incomplete)
     Date: 05/08/2022         Patient Name:  [name] MRN: [id number]  DOB: [birth year] Age / Sex: 87 y.o., female   PCP: Lottie Mussel, MD         Medical Service: Internal Medicine Teaching Service         Attending Physician: Dr. Velna Ochs, MD    First Contact: Dr. Marland Kitchen Pager: [phone]  Second Contact: Dr. Marland Kitchen Pager: [phone]       After Hours (After 5p/  First Contact Pager: [phone]  weekends / holidays): Second Contact Pager: [phone]   Chief Concern: Difficulty breathing Indigestion  History of Present Illness: 87 year old female with history of restrictive lung disease presents with worsening shortness of breath over the last week or so.  ROS***  Medications: ***  Allergies: ***  Past Medical History: ***  Surgical History: ***  Family History:  ***  Social History:  ***  Physical Exam: Blood pressure (!) 130/58, pulse 87, temperature 98.5 F (36.9 C), temperature source Oral, resp. rate 18, height '5\' 2"'$  (1.575 m), weight 109.3 kg, SpO2 97 %.  ***  EKG:  ***  Labs: ***  Images and other studies: ***  Assessment & Plan:  [name] is a 87 y.o. ***  Principal Problem:   Acute on chronic hypoxic respiratory failure (HCC) Active Problems:   Hypothyroidism   Severe obesity (BMI >= 40) (HCC)   Essential hypertension   Restrictive lung disease   Elevated hemidiaphragm   OSA (obstructive sleep apnea)   Moderate aortic stenosis   CHF exacerbation (HCC)   Diet: *** IVF: *** VTE: *** Code: *** Surrogate: ***  Admit patient to {[id number]::"Observation with expected length of stay less than 2 midnights.","Inpatient with expected length of stay greater than 2 midnights."}  Signed: Nani Gasser MD 05/08/2022, 12:04 AM  Pager: [phone] After 5pm on weekdays and 1pm on weekends: [phone]

## 2022-05-08 NOTE — Progress Notes (Signed)
SATURATION QUALIFICATIONS: (This note is used to comply with regulatory documentation for home oxygen)  Patient Saturations on Room Air at Rest = 88%  Patient Saturations on Room Air while Ambulating = N/A  Patient Saturations on 3 Liters of oxygen while Ambulating = 91%  Please briefly explain why patient needs home oxygen: Pt is requiring 1L O2 to maintain sats >/= 91% at rest and 3L O2 to maintain sats >/= 91% when ambulating.   Moishe Spice, PT, DPT Acute Rehabilitation Services  Office: [phone]

## 2022-05-08 NOTE — Progress Notes (Addendum)
Subjective:   Interviewed at bedside. Patient is feeling well overall today. Had a brief episode of burning centralized chest pain this morning that resolved spontaneously. Denies radiation of this pain. States this feels similar to prior episodes of acid reflux. Denies SOB or CP.   Objective:  Vital signs in last 24 hours: Vitals:   05/08/22 0121 05/08/22 0527 05/08/22 0702 05/08/22 0747  BP: 128/73 (!) 153/77  (!) 151/87  Pulse: 87 93  91  Resp: '18 18  18  '$ Temp: 98.7 F (37.1 C) 98.3 F (36.8 C)  98.2 F (36.8 C)  TempSrc: Oral Oral  Oral  SpO2: 95% 97%  97%  Weight:   113.9 kg   Height:       Physical Exam: Constitutional: Well-developed, well-nourished, appears comfortable  HENT: Normocephalic and atraumatic.  Eyes: EOMI. PERRL.  Neck: Normal range of motion.  Cardiovascular: Regular rate, regular rhythm. 4/6 systolic murmur heard best at RUSB, No rubs or gallops. Normal radial and PT pulses bilaterally. 1+ bilateral lower extremity edema.  Pulmonary: Normal respiratory effort on 2L [place]. Trace wheezes in all lung fields. No rales, rhonchi, or crackles.   Abdominal: Soft. Non-distended. No tenderness. Normal bowel sounds.  Musculoskeletal: Normal range of motion.     Neurological: Alert and oriented to person, place, and time. Non-focal. Skin: warm and dry.    Assessment/Plan:  Principal Problem:   Acute on chronic hypoxic respiratory failure (HCC) Active Problems:   Hypothyroidism   Severe obesity (BMI >= 40) (HCC)   Essential hypertension   Restrictive lung disease   Elevated hemidiaphragm   OSA (obstructive sleep apnea)   Moderate aortic stenosis   CHF exacerbation ([place])   [name] is a 87 y.o. with a history of restrictive lung disease and aortic stenosis who presents with acute on chronic hypoxic respiratory failure and volume overload concerning for acute decompensated heart failure.   #Acute on chronic hypoxic respiratory failure #New CHF #Asthma /  ILD exacerbation  #Volume overload Presented w/ orthopnea, PND, JVD and lower extremity edema. Low suspicion for COPD at this time although does have some wheezing on exam. Remains afebrile without leukocytosis. Flu, RSV, and COVID-negative. Low suspicion for pneumonia.  Does have low lung volumes on CXR, possible element of obesity hypoventilation syndrome. On exam, has lower extremity edema concerning for fluid overload.  Will treat for suspected acute decompensated heart failure at this time.  Will treat with DuoNebs and steroids given wheezing and possibility of coexisting obstructive lung disease. -IV lasix 40 mg BID -Pending echo -Duonebs PRN -Start prednisone 40 mg daily   #Aortic stenosis No complaints of dizziness, lightheadedness, or syncope at home. 4/6 stock murmur heard best at the right upper sternal border however audible in all regions of the chest.  Echo from 07/2021 demonstrates moderate aortic stenosis w/ EF 65-70%.  Will obtain repeat echo to assess for progression.  It is possible that her symptoms may be related to worsening aortic stenosis.  Will consult cardiology if echo returns with new abnormalities or worsening of her aortic stenosis. -Pending echo -Monitor telemetry   #Chest pain Patient reports burning, nonradiating, intermittent chest pain that occurred this morning resolved spontaneously.  She reports that this is similar to prior episodes of acid reflux.  EKG and troponin WNL.  Suspect symptoms are likely secondary to acid reflux. -Continue home pantoprazole 40 mg daily -Tums PRN   #History of hypothyroidism TSH from 11 days ago WNL. - Continue home Synthroid 25  mcg daily   #History of hypertension Intermittently hypertensive. Systolic BP [id number] this morning.  -Continue home Carvedilol 25 mg BID -Consider restarting home losartan 100 mg daily   #History of OSA -Continue nightly CPAP   #History of CAD -Continue home Aspirin 81 mg -Continue home  Atorvastatin 40 mg   Diet: HH Bowel: none VTE: lovenox IVF: none Code: Full PT/OT recs: none   Prior to Admission Living Arrangement: at home w/ daughter and grandchild Anticipated Discharge Location: TBD Barriers to Discharge: continued management Dispo: Anticipated discharge in approximately less than 2 day(s).   [name], MD 05/08/2022, 7:55 AM Pager: [phone] After 5pm on weekdays and 1pm on weekends: On Call pager [phone]

## 2022-05-08 NOTE — Progress Notes (Signed)
New Admission Note:   Arrival Method: stretcher Mental Orientation: alertx4 Telemetry: box 7 Assessment: Completed Skin: see flowsheet IV: nsl Pain: none Tubes: none Safety Measures: Safety Fall Prevention Plan has been discussed Admission: Completed [facility] [place] Orientation: Patient has been orientated to the room, unit and staff.  Family: at bedside  Orders have been reviewed and implemented. Will continue to monitor the patient. Call light has been placed within reach and bed alarm has been activated.   Rockie Neighbours BSN, RN Phone number: [phone] Admission Note:

## 2022-05-08 NOTE — Plan of Care (Signed)

## 2022-05-09 ENCOUNTER — Other Ambulatory Visit (HOSPITAL_COMMUNITY): Payer: Self-pay

## 2022-05-09 ENCOUNTER — Inpatient Hospital Stay (HOSPITAL_COMMUNITY): Payer: Medicare HMO

## 2022-05-09 DIAGNOSIS — I5031 Acute diastolic (congestive) heart failure: Secondary | ICD-10-CM

## 2022-05-09 DIAGNOSIS — J984 Other disorders of lung: Secondary | ICD-10-CM | POA: Diagnosis not present

## 2022-05-09 DIAGNOSIS — I350 Nonrheumatic aortic (valve) stenosis: Secondary | ICD-10-CM

## 2022-05-09 DIAGNOSIS — J9621 Acute and chronic respiratory failure with hypoxia: Secondary | ICD-10-CM | POA: Diagnosis not present

## 2022-05-09 DIAGNOSIS — I509 Heart failure, unspecified: Secondary | ICD-10-CM | POA: Diagnosis not present

## 2022-05-09 LAB — ECHOCARDIOGRAM COMPLETE
AR max vel: 1.83 cm2
AV Area VTI: 1.62 cm2
AV Area mean vel: 1.75 cm2
AV Mean grad: 17.6 mmHg
AV Peak grad: 30.7 mmHg
Ao pk vel: 2.77 m/s
Area-P 1/2: 3.08 cm2
Calc EF: 62.6 %
Height: 62 in
MV VTI: 2.74 cm2
S' Lateral: 2.9 cm
Single Plane A2C EF: 63 %
Single Plane A4C EF: 62.1 %
Weight: 3824 oz

## 2022-05-09 LAB — BASIC METABOLIC PANEL
Anion gap: 5 (ref 5–15)
BUN: 16 mg/dL (ref 8–23)
CO2: 31 mmol/L (ref 22–32)
Calcium: 9 mg/dL (ref 8.9–10.3)
Chloride: 100 mmol/L (ref 98–111)
Creatinine, Ser: 0.8 mg/dL (ref 0.44–1.00)
GFR, Estimated: >60 mL/min (ref >60)
Glucose, Bld: 166 mg/dL — ABNORMAL HIGH (ref 70–99)
Potassium: 4.4 mmol/L (ref 3.5–5.1)
Sodium: 136 mmol/L (ref 135–145)

## 2022-05-09 MED ORDER — PREDNISONE 20 MG PO TABS
30.0000 mg | ORAL_TABLET | Freq: Every day | ORAL | Status: AC
  Administered 2022-05-10: 30 mg via ORAL
  Filled 2022-05-09: qty 1

## 2022-05-09 NOTE — Progress Notes (Signed)
  Echocardiogram 2D Echocardiogram has been performed.  [name] 05/09/2022, 2:17 PM

## 2022-05-09 NOTE — Progress Notes (Signed)
2D echo attempted, patient in chair. Will try later

## 2022-05-09 NOTE — Evaluation (Signed)
Occupational Therapy Evaluation Patient Details Name: [name] MRN: [id number] DOB: April 15, 1935 Today's Date: 05/09/2022   History of Present Illness Pt is an 87 y.o. female who presented 05/07/22 with worsening SOB. Admitted with acute on chronic hypoxic respiratory failure and volume overload concerning for mild acute decompensated heart failure. PMH: asthma, cancer, HLD, HTN, hypothyroidism, murmur, OA, restrictive lung disease   Clinical Impression   PTA, pt lives with daughter and great grandson. Pt is typically Modified Independent with ADLs and mobility using cane vs RW; increasing difficulty with IADL mgmt and tub transfers. Pt presents now moving well requiring no more than Supervision for mobility using RW and Setup-Min A for ADLs. Began educating on energy conservation strategies (handout provided), consideration of tub bench at home and gradual return to function. Pt motivated for [facility] therapy follow up at home and also inquiring about the possibility of a [place] aide.    Variable O2 reading throughout session though pt denying excessive SOB. Pt reports only PRN use of O2 at home aside from nighttime wear. Currently requires at least 1 L O2 to maintain sats with ADLs.     Recommendations for follow up therapy are one component of a multi-disciplinary discharge planning process, led by the attending physician.  Recommendations may be updated based on patient status, additional functional criteria and insurance authorization.   Follow Up Recommendations  Home health OT     Assistance Recommended at Discharge Set up Supervision/Assistance  Patient can return home with the following A little help with bathing/dressing/bathroom;Assistance with cooking/housework    Functional Status Assessment  Patient has had a recent decline in their functional status and demonstrates the ability to make significant improvements in function in a reasonable and predictable amount of time.  Equipment  Recommendations  None recommended by OT    Recommendations for Other Services       Precautions / Restrictions Precautions Precautions: Fall;Other (comment) Precaution Comments: watch SpO2 (2L PRN vs nighttime use only per pt) Restrictions Weight Bearing Restrictions: No      Mobility Bed Mobility Overal bed mobility: Modified Independent                  Transfers Overall transfer level: Needs assistance Equipment used: Rolling walker (2 wheels), None, 1 person hand held assist Transfers: Sit to/from Stand, Bed to chair/wheelchair/BSC Sit to Stand: Supervision     Step pivot transfers: Min guard     General transfer comment: initially stood on scale with RN present then able to pivot to recliner from bed with handheld assist. Able to stand from recliner with RW without issue for bathroom mobility      Balance Overall balance assessment: Needs assistance Sitting-balance support: No upper extremity supported, Feet supported Sitting balance-Leahy Scale: Good     Standing balance support: Bilateral upper extremity supported, During functional activity Standing balance-Leahy Scale: Fair                             ADL either performed or assessed with clinical judgement   ADL Overall ADL's : Needs assistance/impaired Eating/Feeding: Independent   Grooming: Supervision/safety;Standing   Upper Body Bathing: Set up;Sitting   Lower Body Bathing: Sit to/from stand;Min guard   Upper Body Dressing : Set up;Sitting   Lower Body Dressing: Minimal assistance;Sit to/from stand   Toilet Transfer: Supervision/safety;Ambulation;Rolling walker (2 wheels) Toilet Transfer Details (indicate cue type and reason): BSC over toilet to increase ease of transfers  Toileting- Clothing Manipulation and Hygiene: Supervision/safety;Sit to/from stand;Sitting/lateral lean Toileting - Clothing Manipulation Details (indicate cue type and reason): able to manage peri care in  standing without assist     Functional mobility during ADLs: Supervision/safety;Rolling walker (2 wheels) General ADL Comments: Discussed energy conservation strategies, logging weights/vitals at home, obtaining pulse ox to monitor O2 at home. Educated on tub bench to maximize ease of tub transfers, that likely [facility] therapists could help assess and determine if this would be a good fit in home environment     Vision Ability to See in Adequate Light: 0 Adequate Patient Visual Report: No change from baseline Vision Assessment?: No apparent visual deficits     Perception     Praxis      Pertinent Vitals/Pain Pain Assessment Pain Assessment: No/denies pain     Hand Dominance Right   Extremity/Trunk Assessment Upper Extremity Assessment Upper Extremity Assessment: Overall WFL for tasks assessed   Lower Extremity Assessment Lower Extremity Assessment: Defer to PT evaluation   Cervical / Trunk Assessment Cervical / Trunk Assessment: Normal   Communication Communication Communication: No difficulties   Cognition Arousal/Alertness: Awake/alert Behavior During Therapy: WFL for tasks assessed/performed Overall Cognitive Status: Within Functional Limits for tasks assessed                                       General Comments  variable readings on O2 monitor in room vs dynamap. on 1 L O2, 96%. removed for scale transfer and pivot to chair with room monitor 85% but dynamap 92%. Ambulated to/from bathroom on RA, denies any excessive SOB. dynamap reading 80%. replaced 1 L O2 with variable readings 85 up to 93%. Left on 1 L O2    Exercises     Shoulder Instructions      Home Living Family/patient expects to be discharged to:: Private residence Living Arrangements: Children;Other (Comment) (great grandson) Available Help at Discharge: Family;Available PRN/intermittently (daughter disabled from multiple CVAs and needs an aide herself; great grandson goes to school, 34  y.o.) Type of Home: Apartment Home Access: Level entry     Home Layout: One level     Bathroom Shower/Tub: Teacher, early years/pre: Standard Bathroom Accessibility: Yes   Home Equipment: Advice worker (2 wheels);Cane - single point   Additional Comments: reports wearing O2 mainly at night and PRN during the day; reports using it continuously prior to this admission due to SOB      Prior Functioning/Environment Prior Level of Function : Independent/Modified Independent             Mobility Comments: Uses cane majority of time, intermittently uses RW depending on balance; no falls in past 6 months ADLs Comments: Does not drive, son or SCAT provides transportation; does cooking and cleaning but has been having difficulty with it recently. able to manage ADLs but difficulty stepping over tub        OT Problem List: Decreased activity tolerance;Cardiopulmonary status limiting activity      OT Treatment/Interventions: Self-care/ADL training;Therapeutic exercise;Energy conservation;DME and/or AE instruction;Therapeutic activities;Patient/family education    OT Goals(Current goals can be found in the care plan section) Acute Rehab OT Goals Patient Stated Goal: home soon, get an aide OT Goal Formulation: With patient Time For Goal Achievement: 05/23/22 Potential to Achieve Goals: Good  OT Frequency: Min 2X/week    Co-evaluation  AM-PAC OT "6 Clicks" Daily Activity     Outcome Measure Help from another person eating meals?: None Help from another person taking care of personal grooming?: A Little Help from another person toileting, which includes using toliet, bedpan, or urinal?: A Little Help from another person bathing (including washing, rinsing, drying)?: A Little Help from another person to put on and taking off regular upper body clothing?: A Little Help from another person to put on and taking off regular lower body clothing?:  A Little 6 Click Score: 19   End of Session Equipment Utilized During Treatment: Rolling walker (2 wheels);Oxygen Nurse Communication: Mobility status;Other (comment) (O2, urine output, breakfast tray)  Activity Tolerance: Patient tolerated treatment well Patient left: in chair;with call bell/phone within reach;with chair alarm set  OT Visit Diagnosis: Unsteadiness on feet (R26.81);Other abnormalities of gait and mobility (R26.89)                Time: [id number] OT Time Calculation (min): 26 min Charges:  OT General Charges $OT Visit: 1 Visit OT Evaluation $OT Eval Low Complexity: 1 Low OT Treatments $Self Care/Home Management : 8-22 mins  Malachy Chamber, OTR/L Acute Rehab Services Office: [phone]   Layla Maw 05/09/2022, 8:54 AM

## 2022-05-09 NOTE — Progress Notes (Signed)
Physical Therapy Treatment  Patient Details Name: [name] MRN: [id number] DOB: [birth year] Today's Date: 05/09/2022   History of Present Illness Pt is an 87 y.o. female who presented 05/07/22 with worsening SOB. Admitted with acute on chronic hypoxic respiratory failure and volume overload concerning for mild acute decompensated heart failure. PMH: asthma, cancer, HLD, HTN, hypothyroidism, murmur, OA, restrictive lung disease    PT Comments    Pt progressing towards physical therapy goals. Pt initially on RA, and pt maintained sats 88%-90% for ~30'. Then sats gradually declined to 81%. Supplemental O2 replaced at 2L and sats increased to 88% at rest. With ambulation, sats again decreasing to 81% by end of gait training. Supplemental O2 increased to 3L/min until pt recovered into 90's and was then slowly titrated down back to 1L by end of session with sats maintaining at 94%. Will continue to follow and progress as able per POC.    Recommendations for follow up therapy are one component of a multi-disciplinary discharge planning process, led by the attending physician.  Recommendations may be updated based on patient status, additional functional criteria and insurance authorization.  Follow Up Recommendations  Home health PT (& HH services)     Assistance Recommended at Discharge Intermittent Supervision/Assistance  Patient can return home with the following Assistance with cooking/housework;Assist for transportation   Equipment Recommendations  Rollator (4 wheels)    Recommendations for Other Services       Precautions / Restrictions Precautions Precautions: Fall;Other (comment) Precaution Comments: watch SpO2 (1-2L/min supplemental O2 PRN vs nighttime use only per pt) Restrictions Weight Bearing Restrictions: No     Mobility  Bed Mobility               General bed mobility comments: Pt sitting in recliner upon arrival.    Transfers Overall transfer level: Needs  assistance Equipment used: Rolling walker (2 wheels) Transfers: Sit to/from Stand Sit to Stand: Min guard           General transfer comment: VC's for hand placement on seated surface for safety. No assist required but close hands on guarding provided for safety.    Ambulation/Gait Ambulation/Gait assistance: Min guard Gait Distance (Feet): 150 Feet Assistive device: Rolling walker (2 wheels) Gait Pattern/deviations: Step-through pattern, Decreased stride length Gait velocity: Decreased Gait velocity interpretation: <1.8 ft/sec, indicate of risk for recurrent falls   General Gait Details: Slow but generally steady with RW for support. Pt initially on RA, and pt maintained sats 88%-90% for ~30'. Then sats gradually declined to 81%. Supplemental O2 replaced at 2L and sats increased to 88% at rest. With ambulation, sats again decreasing to 81% by end of gait training. Supplemental O2 increased to 3L/min until pt recovered into 90's and was then slowly titrated down back to 1L..   Stairs             Wheelchair Mobility    Modified Rankin (Stroke Patients Only)       Balance Overall balance assessment: Needs assistance Sitting-balance support: No upper extremity supported, Feet supported Sitting balance-Leahy Scale: Good     Standing balance support: Bilateral upper extremity supported, During functional activity Standing balance-Leahy Scale: Fair Standing balance comment: Reliant on RW to ambulate                            Cognition Arousal/Alertness: Awake/alert Behavior During Therapy: WFL for tasks assessed/performed Overall Cognitive Status: Within Functional Limits for tasks assessed  Exercises General Exercises - Lower Extremity Long Arc Quad: 10 reps, Both    General Comments        Pertinent Vitals/Pain Pain Assessment Pain Assessment: No/denies pain    Home Living                           Prior Function            PT Goals (current goals can now be found in the care plan section) Acute Rehab PT Goals Patient Stated Goal: to get some occasional assistance at home PT Goal Formulation: With patient/family Time For Goal Achievement: 05/22/22 Potential to Achieve Goals: Good Progress towards PT goals: Progressing toward goals    Frequency    Min 3X/week      PT Plan Current plan remains appropriate    Co-evaluation              AM-PAC PT "6 Clicks" Mobility   Outcome Measure  Help needed turning from your back to your side while in a flat bed without using bedrails?: None Help needed moving from lying on your back to sitting on the side of a flat bed without using bedrails?: A Little Help needed moving to and from a bed to a chair (including a wheelchair)?: A Little Help needed standing up from a chair using your arms (e.g., wheelchair or bedside chair)?: A Little Help needed to walk in hospital room?: A Little Help needed climbing 3-5 steps with a railing? : A Little 6 Click Score: 19    End of Session Equipment Utilized During Treatment: Gait belt;Oxygen Activity Tolerance: Patient tolerated treatment well Patient left: in chair;with call bell/phone within reach;with family/visitor present   PT Visit Diagnosis: Unsteadiness on feet (R26.81);Other abnormalities of gait and mobility (R26.89);Muscle weakness (generalized) (M62.81)     Time: 1109-1130 PT Time Calculation (min) (ACUTE ONLY): 21 min  Charges:  $Gait Training: 8-22 mins                     Rolinda Roan, PT, DPT Acute Rehabilitation Services Secure Chat Preferred Office: [phone]    Thelma Comp 05/09/2022, 1:33 PM

## 2022-05-09 NOTE — Progress Notes (Signed)
Patient refused cpap 

## 2022-05-09 NOTE — Progress Notes (Deleted)
$'@Patient'I$  ID: [name], female    DOB: 04/22/1935, 87 y.o.   MRN: [id number]  No chief complaint on file.   Referring provider: Lottie Mussel, MD  HPI:  87 year old female, former light smoker quit in September 23, 1958. 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  Patient of Dr. Halford Chessman, last seen in office on 09/01/2020 for OSA. Also follows with Dr. Chase Caller for ILD.   Previous [facility] pulmonary encounter: 09/01/20- Dr. Berdie Ogren She is followed by Dr. Chase Caller.  Saw Wyn Quaker.  Had sleep study done.  She has been experiencing snoring, apnea, sleep disruption, and daytime sleepiness.  Sleep study showed mild sleep apnea.  Her brother and her son both use CPAP.  Has nocturnal oxygen set up through [place], but hasn't been using on consistent basis  12/16/2020 Patient presents today for follow-up.  She is looking to get portable oxygen concentrator for daytime use. During her last visit in June with Dr. Halford Chessman she was arranged to start auto CPAP. Patient continues to experience dyspnea with exertion, this is not new. She has no new or acute respiratory symptoms. She qualified for daytime oxygen today. She is maintained on Breo 288mg daily and prn albuterol hfa for asthma. She is wearing 2L oxygen at night. She has not yet been set up with CPAP, ONO is on hold until she can get set up on PAP therapy.   06/10/2021 -   Chief Complaint  Patient presents with   Follow-up    Pt states she has been doing okay since last visit and denies any real complaints.   Multifactorial dyspnea from morbid obesity associated sleep apnea, paralyzed hemidiaphragm, ejection systolic murmur.  And grade 1 diastolic dysfunction in November 2021 echo.  Abnormal CT scan of the chest October 2021 with possible early ILD.  Possible asthma on inhaler therapy with  Breo  HPI Natalie [id number].[id number]. -returns for follow-up.  I last saw her in October 2021 at which time initiated the dyspnea work-up.  It appears since then based on review of the chart she is on CPAP nightly with Dr. SHalford Chessman  She also tells me she is on oxygen x1 year for night and with exertion 2 L but not at rest.  Overall she feels she is better and stable.  She is able to walk longer but she attributes this to oxygen.  Without the oxygen her dyspnea is worse.  In the interim there are no emergency room visits or urgent care visits.  She states no new medical problems.  The only medication change in the last 6 months is that her iron supplement has been removed.  Recent lab 05/26/2021: Reviewed CBC normal  She was sitting there on 2 L nasal cannula.  I turned the oxygen off and 10 minutes later when we rechecked her pulse ox was 92% on room air at rest 06/10/21  CT Chest data  No results found.    12/31/2021- Interim hx  Multifactorial dyspnea from morbid obesity associated sleep apnea, paralyzed hemidiaphragm, ejection systolic murmur.  And grade 1 diastolic dysfunction in November 2021 echo.  Abnormal CT scan of the chest October 2021 with possible early ILD.  [facility] today for follow-up/ PFTs.   Breathing is about the same. She has dyspnea symptoms with exertion only. She is able to do most ADLs  but needs to sit to rest. No coughing symptoms. She wears oxygen during the day on occasional and at night. She is compliant with CPAP use.   CT chest in September 2023 showed mild fibrotic lung disease, unchanged since previous imaging  Severe elevation left hemidiaphragm remains unchanged  Pulmonary function testing shows severe restriction, however diffusion, capacity remains stable if not some better compared to previous.     05/10/2022- Interim hx  Patient presents today for OV follow, she no showed an apt she had with Dr. Chase Caller in  January. Patient PCP reached out to [place] to help reschedule visit.    We gave her a sample of Trelegy 262mg in place of BREO, she can pick up a sample here in the office  Her fibrosis is minimal I don't expect that has changed much since the fall. Regular chest xray should be fine and spirometry with DLCO to reassess. We can order theses     SYMPTOM SCALE - ILD 01/01/2020 Last Weight  Most recent update: 01/01/2020 11:12 AM      Weight  112.9 kg (248 lb 12.8 oz)                     12/31/2021   O2 use ra RA  Shortness of Breath 0 -> 5 scale with 5 being worst (score 6 If unable to do)   At rest 4 0  Simple tasks - showers, clothes change, eating, shaving 5 3  Household (dishes, doing bed, laundry) 5 3  Shopping 5 NA  Walking level at own pace 5 4  Walking up Stairs 6 6  Total (30-36) Dyspnea Score x 16  How bad is your cough? Yes with wheeze 0  How bad is your fatigue yes 0  How bad is nausea 00 0  How bad is vomiting?   0 0  How bad is diarrhea? 0 0  How bad is anxiety? 0 0  How bad is depression 0 0             Allergies  Allergen Reactions   Acyclovir And Related    Metoprolol Itching   Food Rash and Other (See Comments)    Pt states that she is allergic to pickles.      Immunization History  Administered Date(s) Administered   Fluad Quad(high Dose 65+) 12/27/2019, 02/22/2022   H1N1 03/26/2008   Influenza Split 11/25/2010, 01/19/2012   Influenza Whole 02/09/2006, 12/10/2007, 04/16/2009, 01/07/2010   Influenza, High Dose Seasonal PF 05/06/2021   Influenza,inj,Quad PF,6+ Mos 02/15/2013, 12/09/2014, 01/12/2016, 11/22/2016, 11/14/2017   PFIZER(Purple Top)SARS-COV-2 Vaccination 04/13/2019, 05/04/2019   Pneumococcal Conjugate-13 09/04/2014   Pneumococcal Polysaccharide-23 09/10/2010   Tdap 11/25/2010, 05/26/2021    Past Medical History:  Diagnosis Date   Allergic rhinitis, cause unspecified    Asthma    Cancer ([place]    [place]F exacerbation ([place]  05/08/2022   Chronic back pain    Coronary artery calcification seen on CAT scan 04/21/2017   Enlarged pulmonary artery ([facility] 04/21/2017   By chest CT   Frequent urination 11/14/2017   GERD (gastroesophageal reflux disease) 05/19/2011   history of Bilateral leg edema-likely amlodipine induced. 07/28/2011   history of CAP (community acquired pneumonia) 07/14/2011   history of HYPOTHYROIDISM, BORDERLINE 03/20/2006   Annotation: Asymptomatic, untreated Qualifier: Diagnosis of  By: ETomasa HostellerMD, VEdmon Crape    Hx of breast cancer    Hyperlipidemia    Hypertension    Hypothyroidism    Lumbago  Murmur, cardiac 11/22/2016   OBESITY NOS 03/20/2006   Qualifier: Diagnosis of  By: Tomasa Hosteller MD, [place] 12/13/2005   Annotation: bilateral knees;right knee injection 6/05 Qualifier: Diagnosis of  By: Tomasa Hosteller MD, Edmon Crape.    Pain, dental 09/18/2020   Preventative health care 09/04/2014   Pulmonary nodule 05/30/2017   4 mm right middle lobe nodule noted on high-resolution CT done on February 06, 2017   right shoulder pain, likely Impingement syndrome  09/09/2010   Upper respiratory infection 04/09/2018   VENOUS INSUFFICIENCY, CHRONIC 03/20/2006   Qualifier: Diagnosis of  By: Tomasa Hosteller MD, Edmon Crape.     Tobacco History: Social History   Tobacco Use  Smoking Status Former   Packs/day: 0.25   Years: 0.50   Total pack years: 0.13   Types: Cigarettes   Quit date: 09/09/1958   Years since quitting: 63.7  Smokeless Tobacco Never  Tobacco Comments   social smoker   Counseling given: Not Answered Tobacco comments: social smoker   Facility-Administered Medications Prior to Visit  Medication Dose Route Frequency Provider Last Rate Last Admin   0.9 %  sodium chloride infusion  250 mL Intravenous PRN Doutova, Nyoka Lint, MD       acetaminophen (TYLENOL) tablet 650 mg  650 mg Oral Q6H PRN Doutova, Anastassia, MD       Or   acetaminophen (TYLENOL) suppository 650 mg  650 mg Rectal Q6H  PRN Doutova, Anastassia, MD       acetaminophen-codeine (TYLENOL #3) 300-30 MG per tablet 1 tablet  1 tablet Oral Q4H PRN Gaylan Gerold, DO   1 tablet at 05/08/22 2156   aspirin EC tablet 81 mg  81 mg Oral Daily Doutova, Anastassia, MD   81 mg at 05/09/22 0802   atorvastatin (LIPITOR) tablet 40 mg  40 mg Oral Daily Doutova, Anastassia, MD   40 mg at 05/09/22 0802   brimonidine (ALPHAGAN) 0.2 % ophthalmic solution 1 drop  1 drop Left Eye Daily Gaylan Gerold, DO   1 drop at 05/09/22 0806   calcium carbonate (TUMS - dosed in mg elemental calcium) chewable tablet 200 mg of elemental calcium  1 tablet Oral Q8H PRN Gaylan Gerold, DO       carvedilol (COREG) tablet 25 mg  25 mg Oral BID WC Gaylan Gerold, DO   25 mg at 05/09/22 0802   dorzolamide-timolol (COSOPT) 2-0.5 % ophthalmic solution 1 drop  1 drop Left Eye Daily Doutova, Nyoka Lint, MD   1 drop at 05/09/22 0805   enoxaparin (LOVENOX) injection 60 mg  60 mg Subcutaneous Q24H Gaylan Gerold, DO   60 mg at 05/09/22 0803   fluticasone furoate-vilanterol (BREO ELLIPTA) 200-25 MCG/ACT 1 puff  1 puff Inhalation Daily Gaylan Gerold, DO   1 puff at 05/09/22 0803   ipratropium-albuterol (DUONEB) 0.5-2.5 (3) MG/3ML nebulizer solution 3 mL  3 mL Nebulization Q4H PRN Gaylan Gerold, DO   3 mL at 05/08/22 1002   latanoprost (XALATAN) 0.005 % ophthalmic solution 1 drop  1 drop Left Eye Daily Toy Baker, MD   1 drop at 05/09/22 0805   levothyroxine (SYNTHROID) tablet 25 mcg  25 mcg Oral Q0600 Toy Baker, MD   25 mcg at 05/09/22 0637   pantoprazole (PROTONIX) EC tablet 40 mg  40 mg Oral Daily Doutova, Anastassia, MD   40 mg at 05/09/22 0802   [START ON 05/10/2022] predniSONE (DELTASONE) tablet 30 mg  30 mg Oral Q breakfast Mapp, Claudia Desanctis, MD  sodium chloride flush (NS) 0.9 % injection 3 mL  3 mL Intravenous Q12H Doutova, Anastassia, MD   3 mL at 05/09/22 0805   sodium chloride flush (NS) 0.9 % injection 3 mL  3 mL Intravenous PRN Toy Baker, MD        Outpatient Medications Prior to Visit  Medication Sig Dispense Refill   acetaminophen-codeine (TYLENOL #3) 300-30 MG tablet TAKE 1 TABLET EVERY 4 HOURS AS NEEDED FOR MODERATE PAIN. 30 tablet 0   albuterol (VENTOLIN HFA) 108 (90 Base) MCG/ACT inhaler INHALE 2 PUFFS INTO THE LUNGS EVERY 6 HOURS AS NEEDED FOR WHEEZING OR SHORTNESS OF BREATH. 1 each 3   amLODipine (NORVASC) 10 MG tablet TAKE 1 TABLET EVERY DAY 90 tablet 3   aspirin EC 81 MG tablet Take 81 mg by mouth daily.     atorvastatin (LIPITOR) 40 MG tablet TAKE 1 TABLET (40 MG TOTAL) BY MOUTH DAILY. 90 tablet 3   BREO ELLIPTA 200-25 MCG/ACT AEPB INHALE 1 PUFF INTO THE LUNGS DAILY 180 each 1   brimonidine (ALPHAGAN) 0.2 % ophthalmic solution Place 1 drop into the left eye daily.     carvedilol (COREG) 25 MG tablet TAKE 1 TABLET BY MOUTH TWICE DAILY WITH A MEAL 180 tablet 3   Cyanocobalamin (VITAMIN B12 PO) Take 1 tablet daily by mouth.     diclofenac Sodium (VOLTAREN) 1 % GEL APPLY 2 GRAMS TOPICALLY 4 TIMES DAILY 700 g 10   dorzolamide-timolol (COSOPT) 22.3-6.8 MG/ML ophthalmic solution Place 1 drop into the left eye daily.     latanoprost (XALATAN) 0.005 % ophthalmic solution Place 1 drop into the left eye daily.     levothyroxine (SYNTHROID) 25 MCG tablet TAKE 1/2 TABLET EVERY DAY BEFORE BREAKFAST 45 tablet 3   losartan (COZAAR) 100 MG tablet TAKE 1 TABLET (100 MG TOTAL) BY MOUTH DAILY. 90 tablet 3   pantoprazole (PROTONIX) 40 MG tablet TAKE 1 TABLET (40 MG TOTAL) BY MOUTH DAILY. 90 tablet 3      Review of Systems  Review of Systems   Physical Exam  There were no vitals taken for this visit. Physical Exam   Lab Results:  CBC    Component Value Date/Time   WBC 6.1 05/08/2022 0712   RBC 3.96 05/08/2022 0712   HGB 11.0 (L) 05/08/2022 0712   HGB 11.7 04/27/2022 1135   HCT 33.1 (L) 05/08/2022 0712   HCT 36.3 04/27/2022 1135   PLT 197 05/08/2022 0712   PLT 215 04/27/2022 1135   MCV 83.6 05/08/2022 0712   MCV 85  04/27/2022 1135   MCH 27.8 05/08/2022 0712   MCHC 33.2 05/08/2022 0712   RDW 13.4 05/08/2022 0712   RDW 13.0 04/27/2022 1135   LYMPHSABS 1.6 05/07/2022 1850   LYMPHSABS 1.6 05/26/2021 1218   MONOABS 0.8 05/07/2022 1850   EOSABS 0.2 05/07/2022 1850   EOSABS 0.4 05/26/2021 1218   BASOSABS 0.1 05/07/2022 1850   BASOSABS 0.1 05/26/2021 1218    BMET    Component Value Date/Time   NA 136 05/09/2022 0346   NA 138 05/26/2021 1218   K 4.4 05/09/2022 0346   CL 100 05/09/2022 0346   CO2 31 05/09/2022 0346   GLUCOSE 166 (H) 05/09/2022 0346   BUN 16 05/09/2022 0346   BUN 17 05/26/2021 1218   CREATININE 0.80 05/09/2022 0346   CREATININE 0.91 09/23/2014 1415   CALCIUM 9.0 05/09/2022 0346   GFRNONAA >60 05/09/2022 0346   GFRNONAA 61 09/23/2014 1415   GFRAA 66  12/27/2019 1028   GFRAA 71 09/23/2014 1415    BNP    Component Value Date/Time   BNP 189.0 (H) 05/07/2022 1850    ProBNP No results found for: "PROBNP"  Imaging: DG Chest Portable 1 View  Result Date: 05/07/2022 CLINICAL DATA:  Shortness of breath EXAM: PORTABLE CHEST 1 VIEW COMPARISON:  05/06/2021 FINDINGS: Cardiac shadow is mildly prominent but stable. Aortic calcifications are again seen. Elevation of left hemidiaphragm is noted. Increased central vascular congestion is noted without interstitial edema. IMPRESSION: Changes consistent with mild CHF. Electronically Signed   By: Inez Catalina M.D.   On: 05/07/2022 19:05     Assessment & Plan:   No problem-specific Assessment & Plan notes found for this encounter.     Martyn Ehrich, NP 05/09/2022

## 2022-05-09 NOTE — TOC Benefit Eligibility Note (Signed)
Patient Teacher, English as a foreign language completed.    The patient is currently admitted and upon discharge could be taking Farxiga 10 mg.  The current 30 day co-pay is $11.20.   The patient is insured through [place], [place] Patient Advocate Specialist [place] Patient Advocate Team Direct Number: [phone]  Fax: [phone]

## 2022-05-09 NOTE — Progress Notes (Signed)
Mobility Specialist Progress Note   05/09/22 1415  Mobility  Activity Ambulated with assistance in hallway  Level of Assistance Standby assist, set-up cues, supervision of patient - no hands on  Assistive Device Front wheel walker  Distance Ambulated (ft) 150 ft  Range of Motion/Exercises Active;All extremities  Activity Response Tolerated well   Pre Ambulation:  SpO2 93% 1LO2 During Ambulation: SpO2 84% 2LO2; 91% 3LO2 Post Ambulation: SpO2 89-90% 1LO2  Patient received in recliner and agreeable to participate. Stood and ambulated in hallway at supervision level with slow steady gait. Ambulated initially with 2LO2 and desaturated to 84%. With 3LO2, a brief standing rest break and cueing for pursed lip breathing, patients oxygen saturation quickly recovered to 91%.  Returned to room without complaint or incident. Reported feeling better regarding SOB/WOB when on 3LO2. Was left in recliner with all needs met, call bell in reach.   [name], BS EXP Mobility Specialist Please contact via SecureChat or Rehab office at [phone]

## 2022-05-09 NOTE — Progress Notes (Signed)
Subjective:   Patient interviewed at bedside.  Overall, no new complaints today.  Burning chest pain from yesterday has resolved.  Denies chest pain or shortness of breath today.  Discussed plan to obtain echo today.  Objective:  Vital signs in last 24 hours: Vitals:   05/08/22 1518 05/08/22 1819 05/08/22 2057 05/09/22 0528  BP: (!) 143/78 137/79 (!) 145/79 138/74  Pulse:  83 86 87  Resp: '18 18 18 19  '$ Temp: 98.1 F (36.7 C) 98 F (36.7 C) 98.4 F (36.9 C) 98.3 F (36.8 C)  TempSrc: Oral Oral Oral Oral  SpO2: 94% 98% 97% 97%  Weight:      Height:       Physical Exam: Constitutional: Well-developed, well-nourished, appears comfortable  Cardiovascular: Regular rate, regular rhythm. 4/6 systolic murmur heard best at RUSB, No rubs or gallops. Normal radial and PT pulses bilaterally. Trace bilateral lower extremity edema improved from yesterday.  Pulmonary: Normal respiratory effort on 2L [place]. No rales, rhonchi, crackles, or wheezes.   Abdominal: Soft. Non-distended. No tenderness. Normal bowel sounds.  Musculoskeletal: Normal range of motion.     Neurological: Alert and oriented to person, place, and time. Non-focal. Skin: warm and dry.    Assessment/Plan:  Principal Problem:   Acute on chronic hypoxic respiratory failure (HCC) Active Problems:   Severe obesity (BMI >= 40) (HCC)   Restrictive lung disease   Elevated hemidiaphragm   Chronic respiratory failure with hypoxia (HCC)   OSA (obstructive sleep apnea)   Asthma   Moderate aortic stenosis   CHF exacerbation ([place])  [name] is a 87 y.o. with a history of restrictive lung disease and aortic stenosis who presents with acute on chronic hypoxic respiratory failure and volume overload concerning for acute decompensated heart failure.    #Acute on chronic hypoxic respiratory failure #New CHF #Asthma / ILD exacerbation  #Volume overload Presented w/ orthopnea, PND, JVD and lower extremity edema. Suspect 2/2 acute  decompensated heart failure with potential element of obesity hypoventilation syndrome. Required 1L O2 at rest and 3L while ambulating yesterday. Remains afebrile today. Satting well on 2L [place]. Gave IV lasix 40 mg BID yesterday. Good urine output w/ 2.1 L. On exam today, appears euvolemic. Will hold off on diuresis today given reassuring exam. Will obtain echo to assess for signs of worsening cardiac function that could be attributed to her worsening respiratory status on admission. Plan for 5 days of steroid taper.   -Pending echo -Duonebs PRN -Continue prednisone 40 mg daily today, 30 mg tomorrow  #Aortic stenosis Denies dizziness, lightheadedness, or syncope. Has loud 4/6 stock murmur. Echo from 07/2021 w/ moderate aortic stenosis and EF 65-70%. Plan to consult cardiology if echo returns w/ changes or worsening aortic stenosis. Patient would likely benefit from SGLT2i on discharge.  -Pending echo -Monitor telemetry   #Chest pain Burning, nonradiating, intermittent chest pain has resolved w/ home pantoprazole. Likely 2/2 acid reflux.  -Continue home pantoprazole 40 mg daily -Tums PRN   #History of hypothyroidism TSH WNL 11 days ago. - Continue home Synthroid 25 mcg daily   #History of hypertension BP WNL this morning.  -Continue home Carvedilol 25 mg BID -Consider restarting home losartan 100 mg daily   #History of OSA -Continue nightly CPAP   #History of CAD -Continue home Aspirin 81 mg -Continue home Atorvastatin 40 mg   Diet: HH Bowel: none VTE: lovenox IVF: none Code: Full PT/OT recs: HH PT, rollator     Prior to Admission Living  Arrangement: at home w/ daughter and grandchild Anticipated Discharge Location: TBD Barriers to Discharge: continued management Dispo: Anticipated discharge in approximately less than 2 day(s).   [name], MD 05/09/2022, 6:17 AM Pager: [phone] After 5pm on weekdays and 1pm on weekends: On Call pager [phone]

## 2022-05-09 NOTE — Progress Notes (Signed)
Refused Cpap.

## 2022-05-09 NOTE — TOC Initial Note (Signed)
Transition of Care [facility]) - Initial/Assessment Note    Patient Details  Name: [name] MRN: [id number] Date of Birth: 1935-04-24  Transition of Care [facility]) CM/SW Contact:    Tom-Johnson, Renea Ee, RN Phone Number: 05/09/2022, 3:43 PM  Clinical Narrative:                  CM spoke with patient at bedside about needs for post hospital transition. Admitted for Acute on Chronic Hypoxic Resp Failure, on 2L O2. Plan for 5 days of steroid taper. On home O2 @ 2L night time and PRN daytime from [employer].  Patient is from home with daughter and grandson. Has three supportive children and they transport to and from appointments and assists with errands.  Patient has a cane, walker, shower chair and home O2.  PCP is  Lottie Mussel, MD and uses [employer] on [place] and also [employer].  CM requested by family to enquire from [facility] about the Helper Bee program for patient's eligibility. CM called and spoke with [facility], Care Support [phone]) and she states Helper Bee is not included in patient's plan only Home Health. Patient notified. Home Health referral called in to [facility] per patient's request and Claiborne Billings voiced acceptance, info on AVS.  Rollator ordered from [employer], Erasmo Downer to deliver to patient at bedside.  CM will continue to follow as patient progresses with care towards discharge.           Expected Discharge Plan: [place] Barriers to Discharge: Continued Medical Work up   Patient Goals and CMS Choice Patient states their goals for this hospitalization and ongoing recovery are:: To return home CMS Medicare.gov Compare Post Acute Care list provided to:: Patient Choice offered to / list presented to : Patient, Adult Children (Son, Fritz Pickerel)      Expected Discharge Plan and Services   Discharge Planning Services: CM Consult Post Acute Care Choice: [place] arrangements for the past 2 months: Apartment                  DME Arranged: Walker rolling with seat DME Agency: [employer] Date DME Agency Contacted: 05/09/22 Time DME Agency Contacted: [id number] Representative spoke with at DME Agency: Erasmo Downer HH Arranged: PT, OT HH Agency: [place] Date [place]: 05/09/22 Time [place]: [place] Representative spoke with at [place]: [place] Arrangements/Services Living arrangements for the past 2 months: Apartment Lives with:: Adult Children, Minor Children (Daughter and grand son) Patient language and need for interpreter reviewed:: Yes Do you feel safe going back to the place where you live?: Yes      Need for Family Participation in Patient Care: Yes (Comment) Care giver support system in place?: Yes (comment) Current home services: DME (Home O2, cane, walker, shower chair) Criminal Activity/Legal Involvement Pertinent to Current Situation/Hospitalization: No - Comment as needed  Activities of Daily Living Home Assistive Devices/Equipment: Eyeglasses, Dentures (specify type), Cane (specify quad or straight) ADL Screening (condition at time of admission) Patient's cognitive ability adequate to safely complete daily activities?: Yes Is the patient deaf or have difficulty hearing?: No Does the patient have difficulty seeing, even when wearing glasses/contacts?: No Does the patient have difficulty concentrating, remembering, or making decisions?: No Patient able to express need for assistance with ADLs?: Yes Does the patient have difficulty dressing or bathing?: No Independently performs ADLs?: Yes (appropriate for developmental age) Does the patient have difficulty walking or climbing stairs?: Yes  Weakness of Legs: Both Weakness of Arms/Hands: None  Permission Sought/Granted Permission sought to share information with : Family Supports, Case Freight forwarder, Chartered certified accountant granted to share information with : Yes, Verbal Permission Granted               Emotional Assessment Appearance:: Appears stated age Attitude/Demeanor/Rapport: Engaged, Gracious Affect (typically observed): Accepting, Calm, Hopeful, Pleasant, Appropriate Orientation: : Oriented to Self, Oriented to Place, Oriented to  Time, Oriented to Situation Alcohol / Substance Use: Not Applicable Psych Involvement: No (comment)  Admission diagnosis:  COPD exacerbation ([place]) [J44.1] Acute respiratory failure with hypoxia ([place]) [J96.01] COPD with acute exacerbation ([place]) [J44.1] Patient Active Problem List   Diagnosis Date Noted   Acute on chronic hypoxic respiratory failure ([place]) 05/08/2022   CHF exacerbation ([place]) 05/08/2022   COPD with acute exacerbation ([place]) 05/07/2022   Nocturnal polyuria 04/27/2022   ILD (interstitial lung disease) ([place]) 01/03/2022   Neck pain 09/15/2021   Right shoulder pain 08/24/2021   Moderate aortic stenosis 08/24/2021   Cough 04/09/2021   Chronic respiratory failure with hypoxia ([place]) 12/16/2020   OSA (obstructive sleep apnea) 12/16/2020   Asthma 12/16/2020   Pruritus 10/21/2020   Elevated hemidiaphragm 03/10/2020   Dyspnea on exertion 03/10/2020   Healthcare maintenance 03/10/2020   Nocturnal hypoxia 03/10/2020   Anemia 04/09/2018   Coronary artery calcification seen on CAT scan 04/21/2017   Enlarged pulmonary artery ([place]) 04/21/2017   Restrictive lung disease 11/22/2016   Borderline diabetes 09/19/2013   GERD (gastroesophageal reflux disease) 05/19/2011   Hyperlipidemia 05/02/2007   Hypothyroidism 03/20/2006   Severe obesity (BMI >= 40) ([place]) 03/20/2006   Chronic venous insufficiency 03/20/2006   Right-sided low back pain with right-sided sciatica 03/20/2006   BREAST CANCER, HX OF 03/20/2006   Essential hypertension 12/13/2005   Osteoarthritis 12/13/2005   PCP:  Lottie Mussel, MD Pharmacy:   [facility] ([place]), [place] [id number] [place] ([place]) [place] [address] Phone:  [phone] Fax: [phone]  [place] [facility] [place], [place] [address] Phone: [phone] Fax: [place], [place] - [address] AT [place] [address] [place] [address] Phone: [phone] Fax: [phone]     Social Determinants of Health (SDOH) Social History: SDOH Screenings   Food Insecurity: No Food Insecurity (05/08/2022)  Housing: Low Risk  (05/08/2022)  Transportation Needs: No Transportation Needs (05/08/2022)  Utilities: Not At Risk (05/08/2022)  Depression (PHQ2-9): Low Risk  (04/27/2022)  Financial Resource Strain: Low Risk  (03/31/2018)  Physical Activity: Unknown (03/31/2018)  Social Connections: Unknown (03/31/2018)  Stress: Stress Concern Present (03/31/2018)  Tobacco Use: Medium Risk (05/08/2022)   SDOH Interventions: Transportation Interventions: Intervention Not Indicated, Inpatient TOC, Patient Resources (Friends/Family)   Readmission Risk Interventions     No data to display

## 2022-05-10 ENCOUNTER — Ambulatory Visit: Payer: Medicare HMO | Admitting: Primary Care

## 2022-05-10 DIAGNOSIS — J9621 Acute and chronic respiratory failure with hypoxia: Secondary | ICD-10-CM | POA: Diagnosis not present

## 2022-05-10 DIAGNOSIS — J984 Other disorders of lung: Secondary | ICD-10-CM | POA: Diagnosis not present

## 2022-05-10 DIAGNOSIS — I5031 Acute diastolic (congestive) heart failure: Secondary | ICD-10-CM

## 2022-05-10 DIAGNOSIS — I509 Heart failure, unspecified: Secondary | ICD-10-CM | POA: Diagnosis not present

## 2022-05-10 DIAGNOSIS — I350 Nonrheumatic aortic (valve) stenosis: Secondary | ICD-10-CM | POA: Diagnosis not present

## 2022-05-10 LAB — BASIC METABOLIC PANEL
Anion gap: 9 (ref 5–15)
BUN: 16 mg/dL (ref 8–23)
CO2: 28 mmol/L (ref 22–32)
Calcium: 9 mg/dL (ref 8.9–10.3)
Chloride: 101 mmol/L (ref 98–111)
Creatinine, Ser: 0.74 mg/dL (ref 0.44–1.00)
GFR, Estimated: >60 mL/min (ref >60)
Glucose, Bld: 109 mg/dL — ABNORMAL HIGH (ref 70–99)
Potassium: 4 mmol/L (ref 3.5–5.1)
Sodium: 138 mmol/L (ref 135–145)

## 2022-05-10 MED ORDER — PREDNISONE 20 MG PO TABS
20.0000 mg | ORAL_TABLET | Freq: Every day | ORAL | Status: AC
  Administered 2022-05-11: 20 mg via ORAL
  Filled 2022-05-10: qty 1

## 2022-05-10 MED ORDER — FUROSEMIDE 40 MG PO TABS
40.0000 mg | ORAL_TABLET | Freq: Once | ORAL | Status: AC
  Administered 2022-05-10: 40 mg via ORAL
  Filled 2022-05-10: qty 1

## 2022-05-10 MED ORDER — ALUM & MAG HYDROXIDE-SIMETH 200-200-20 MG/5ML PO SUSP
15.0000 mL | Freq: Once | ORAL | Status: AC
  Administered 2022-05-10: 15 mL via ORAL
  Filled 2022-05-10: qty 30

## 2022-05-10 MED ORDER — LEVOTHYROXINE SODIUM 25 MCG PO TABS
12.5000 ug | ORAL_TABLET | Freq: Every day | ORAL | Status: DC
  Administered 2022-05-11: 12.5 ug via ORAL
  Filled 2022-05-10: qty 1

## 2022-05-10 NOTE — Progress Notes (Signed)
Pt refusing CPAP for the night.

## 2022-05-10 NOTE — Care Management Important Message (Signed)
Important Message  Patient Details  Name: [name] MRN: [id number] Date of Birth: [birth year]   Medicare Important Message Given:  Yes     Larisa Lanius 05/10/2022, 2:04 PM

## 2022-05-10 NOTE — Progress Notes (Addendum)
Subjective:   No new events overnight. Had minimal shortness of breath while up and walking yesterday.  Denies chest pain, shortness of breath, dizziness currently.  No new complaints at this time.  Discussed plan to diurese today given signs of fluid overload.  Objective:  Vital signs in last 24 hours: Vitals:   05/09/22 0836 05/09/22 1543 05/09/22 2051 05/10/22 0513  BP: (!) 143/86 (!) 147/71 (!) 146/83 (!) 151/87  Pulse: 74 76 78 79  Resp: '19 17 18 18  '$ Temp: 97.6 F (36.4 C) 98 F (36.7 C) 98.3 F (36.8 C) 97.9 F (36.6 C)  TempSrc: Oral Oral Oral Oral  SpO2: 92% 94% 97% 98%  Weight:      Height:       Physical Exam: Constitutional: Well-developed, well-nourished, appears comfortable  Cardiovascular: Regular rate, regular rhythm. 4/6 systolic murmur heard best at RUSB, No rubs or gallops. Normal radial and PT pulses bilaterally. 1+ bilateral lower extremity edema. JVD 2 cm above sternal angle.  Pulmonary: Normal respiratory effort on 2L [address]. No rales, rhonchi, crackles, or wheezes.   Abdominal: Soft. Non-distended. No tenderness. Normal bowel sounds.  Musculoskeletal: Normal range of motion.     Neurological: Alert and oriented to person, place, and time. Non-focal. Skin: warm and dry.    Assessment/Plan:  Principal Problem:   Acute on chronic hypoxic respiratory failure (HCC) Active Problems:   Severe obesity (BMI >= 40) (HCC)   Restrictive lung disease   Elevated hemidiaphragm   Chronic respiratory failure with hypoxia (HCC)   OSA (obstructive sleep apnea)   Asthma   Moderate aortic stenosis   CHF exacerbation ([place])  [name] is a 87 y.o. with a history of restrictive lung disease (on 2L at home) and aortic stenosis who presents with acute on chronic hypoxic respiratory failure and volume overload concerning for acute decompensated heart failure.    #Acute on chronic hypoxic respiratory failure #Acute HFpEF #Asthma / ILD exacerbation  #Volume  overload Echo w/ EF 60-65%, mild LVH, and moderate AS. Minimal shortness of breath with ambulating yesterday.  Was able to ambulate on home 2 L today with good oxygen saturation. Denies chest pain, shortness of breath, or dizziness today. Weight decreased by ~ 1 kg from yesterday. On exam, appears more volume overloaded than yesterday. Patient may require Lasix upon discharge until she can be reassessed in clinic. Plan for diuresis today. Day 3/5 of steroid taper. Patient would likely benefit from SGLT2i in the near future.  Consider starting SGLT2 inhibitor in clinic given that she will likely go home with Lasix.   -PO lasix 40 mg 1x -Duonebs PRN -Prednisone 30 mg daily today, 20 mg tomorrow   #Aortic stenosis Denies dizziness, lightheadedness, or syncope today. Loud 4/6 stock murmur. Echo w/ EF 60-65%, mild LVH, AV not well visualized but thickened/calcified w/ restricted motion consistent w/ moderate AS (mean gradient is increased but dimensionless index unchanged).   Plan to have patient follow-up with cardiology as outpatient. -Monitor telemetry   #Chest pain - resolved Resolved w/ home pantoprazole. Likely 2/2 acid reflux.  -Continue home pantoprazole 40 mg daily -Tums PRN   #History of hypothyroidism TSH WNL 11 days ago. - Continue home Synthroid 12.5 mcg daily   #History of hypertension Intermittently hypertensive.  -Continue home Carvedilol 25 mg BID -Consider restarting home losartan 100 mg daily   #History of OSA -Continue nightly CPAP   #History of CAD -Continue home Aspirin 81 mg -Continue home Atorvastatin 40 mg  Diet: HH Bowel: none VTE: lovenox IVF: none Code: Full PT/OT recs: HH PT/OT, rollator     Prior to Admission Living Arrangement: at home w/ daughter and grandchild Anticipated Discharge Location: TBD Barriers to Discharge: continued management Dispo: Anticipated discharge in approximately less than 2 day(s).  Natalie Skeans, MD 05/10/2022, 6:30  AM Pager: [phone] After 5pm on weekdays and 1pm on weekends: On Call pager [phone]

## 2022-05-10 NOTE — Consult Note (Signed)
   [facility] CM Inpatient Consult   05/10/2022  [name] [birth year] [id number] [name], RN [facility]) to view chart for orientation  Location: [facility]. Met with pt via bedside with PCP information as pt accepted.  [facility]) [place] (ACO) Patient:Insurance [facility]  Primary Care Provider:Machen, Almyra Free, MD with [facility]  Patient (pt) screened for hospitalization with noted low risk score for unplanned readmission risk and to assess for potential [name] for care coordination needs. Explained benefits of [facility] services along with information provided for the 24 hour concierge nurse advise line. Also provided a [facility] magnet.    Plan: Will continue to follow pt for discharge disposition.  For any inquires, please contact:  Raina Mina, RN, [place] [phone] Hours 8:30-5:00 pm M-F Fax Number: [phone] [place] Nurse Line [phone]

## 2022-05-10 NOTE — Discharge Instructions (Addendum)
You were hospitalized for worsening shortness of breath.  Hospital Course: We suspect that your shortness of breath was due to a heart failure exacerbation. We also noted that you have a heart murmur due to tightness of one of your heart valves. We want you to be followed by cardiology outside of the hospital to discuss heart failure and to keep track of how your heart valves are working. We are discharging you with one more day of steroids to help with your breathing. We are discharging you on lasix to decrease the amount of fluid in your body which we believe will help your breathing. Please follow up with your primary care doctor and cardiology as stated below to discuss these changes. Please follow up with pulmonology as well to discuss your recent hospitalization.   Medications:  Please start taking: -Lasix 40 mg daily -Prednisone 10 mg for 1 day  Physical Therapy/Occupational Therapy: home health physical therapy and occupational therapy will come to your home to help build up your strength and help you navigate your house. We also discharged you with a walker to help with getting around.   Follow-up: - Please follow up with your primary care provider at the [facility] on 05/19/2022 at 10:15 AM.  Phone: [phone] Address: [facility], [place], [place], [place] [address]  - Please follow up with cardiology Dr. Donato Heinz, MD at [facility] (please call to schedule an appointment) Phone: [phone] Address:  [address] [place], [place], [place] [address]  - Please follow up with Pulmonology Dr. Brand Males, MD at [place] at [facility] (please call to schedule an appointment) Phone: [phone] Address: [address], [place], [place], [place] [address]

## 2022-05-10 NOTE — Progress Notes (Addendum)
Mobility Specialist Progress Note   05/10/22 1105  Mobility  Activity Ambulated with assistance in hallway;Ambulated with assistance to bathroom  Level of Assistance Standby assist, set-up cues, supervision of patient - no hands on  Assistive Device Front wheel walker  Distance Ambulated (ft) 180 ft  Activity Response Tolerated well  Mobility Referral Yes  $Mobility charge 1 Mobility   Pre Mobility: 93% SpO2 on 1LO2 During Mobility: 85% SpO2 on 1LO2 Post Mobility: 94% SpO2 on 1LO2  Pt requesting assistance to get from chair to BR d/t void urgency.  Transferred w/ supervision, successful void. Following void, pt able to ambulate in hallway under supervision w/ a steady gait. X1 standing rest break d/t SOB, SpO2 reading 85% on portable pulse ox and requiring 2L of supplemental O2, per advisement of RN. Pt quickly recovered and sitting >90% SpO2, returned back to room w/o any other complaints. Pt placed back in chair and left back on 1LO2 with call bell in reach and chair alarm on.  [place] Falling Mobility Specialist Please contact via [place] or  Rehab office at [phone]

## 2022-05-11 ENCOUNTER — Ambulatory Visit: Payer: Medicare HMO

## 2022-05-11 ENCOUNTER — Encounter: Payer: Medicare HMO | Admitting: Student

## 2022-05-11 ENCOUNTER — Other Ambulatory Visit (HOSPITAL_COMMUNITY): Payer: Self-pay

## 2022-05-11 DIAGNOSIS — I5031 Acute diastolic (congestive) heart failure: Secondary | ICD-10-CM | POA: Diagnosis not present

## 2022-05-11 DIAGNOSIS — J986 Disorders of diaphragm: Secondary | ICD-10-CM

## 2022-05-11 DIAGNOSIS — I350 Nonrheumatic aortic (valve) stenosis: Secondary | ICD-10-CM | POA: Diagnosis not present

## 2022-05-11 DIAGNOSIS — J9621 Acute and chronic respiratory failure with hypoxia: Secondary | ICD-10-CM | POA: Diagnosis not present

## 2022-05-11 LAB — BASIC METABOLIC PANEL
Anion gap: 8 (ref 5–15)
BUN: 18 mg/dL (ref 8–23)
CO2: 32 mmol/L (ref 22–32)
Calcium: 9 mg/dL (ref 8.9–10.3)
Chloride: 99 mmol/L (ref 98–111)
Creatinine, Ser: 0.79 mg/dL (ref 0.44–1.00)
GFR, Estimated: >60 mL/min (ref >60)
Glucose, Bld: 114 mg/dL — ABNORMAL HIGH (ref 70–99)
Potassium: 3.9 mmol/L (ref 3.5–5.1)
Sodium: 139 mmol/L (ref 135–145)

## 2022-05-11 MED ORDER — FUROSEMIDE 40 MG PO TABS
40.0000 mg | ORAL_TABLET | Freq: Every day | ORAL | 0 refills | Status: DC
  Filled 2022-05-11: qty 30, 30d supply, fill #0

## 2022-05-11 MED ORDER — PREDNISONE 10 MG PO TABS
10.0000 mg | ORAL_TABLET | Freq: Every day | ORAL | 0 refills | Status: DC
  Filled 2022-05-11: qty 1, 1d supply, fill #0

## 2022-05-11 NOTE — Progress Notes (Signed)
Heart Failure Navigator Progress Note  Assessed for Heart & Vascular TOC clinic readiness.  Patient EF 60-65% has a hospital follow up with PCP on 05/19/2022.   Navigator available for reassessment of patient.   Earnestine Leys, BSN, Clinical cytogeneticist Only

## 2022-05-11 NOTE — TOC Transition Note (Signed)
Transition of Care [facility]) - CM/SW Discharge Note   Patient Details  Name: [name] MRN: [id number] Date of Birth: [birth year]  Transition of Care [facility]) CM/SW Contact:  Tom-Johnson, Renea Ee, RN Phone Number: 05/11/2022, 1:48 PM   Clinical Narrative:     Patient is scheduled for discharge today. Home health info on AVS. Hospital f/u and discharge instructions on AVS.  CM called ans poke with Langel, grand daughter [phone]) and updated her on home health.  Family to transport at discharge no further TOC needs noted.         Final next level of care: Home w Home Health Services Barriers to Discharge: Barriers Resolved   Patient Goals and CMS Choice CMS Medicare.gov Compare Post Acute Care list provided to:: Patient Choice offered to / list presented to : Patient, Adult Children (Son, Fritz Pickerel)  Discharge Placement                  Patient to be transferred to facility by: Family      Discharge Plan and Services Additional resources added to the After Visit Summary for     Discharge Planning Services: CM Consult Post Acute Care Choice: Home Health          DME Arranged: Walker rolling with seat DME Agency: [employer] Date DME Agency Contacted: 05/09/22 Time DME Agency Contacted: 1520 Representative spoke with at DME Agency: Erasmo Downer HH Arranged: PT, OT HH Agency: [place] Date [place]: 05/09/22 Time [place]: [place] Representative spoke with at [place]: [place] Determinants of Health ([place]) Interventions SDOH Screenings   Food Insecurity: No Food Insecurity (05/08/2022)  Housing: Low Risk  (05/08/2022)  Transportation Needs: No Transportation Needs (05/08/2022)  Utilities: Not At Risk (05/08/2022)  Depression (PHQ2-9): Low Risk  (04/27/2022)  Financial Resource Strain: Low Risk  (03/31/2018)  Physical Activity: Unknown (03/31/2018)  Social Connections: Unknown (03/31/2018)  Stress: Stress Concern Present  (03/31/2018)  Tobacco Use: Medium Risk (05/08/2022)     Readmission Risk Interventions     No data to display

## 2022-05-11 NOTE — Discharge Summary (Addendum)
Name: [name] MRN: [id number] DOB: Dec 12, 1935 87 y.o. PCP: Lottie Mussel, MD  Date of Admission: 05/07/2022  6:38 PM Date of Discharge: 05/11/2022 Attending Physician: Charise Killian, MD  Discharge Diagnosis: 1. Principal Problem:   Acute on chronic hypoxic respiratory failure (HCC) in the setting of CHF exacerbation (HCC) Active Problems:   Severe obesity (BMI >= 40) (HCC)   Restrictive lung disease   Elevated hemidiaphragm   Chronic respiratory failure with hypoxia (HCC)   OSA (obstructive sleep apnea)   Asthma   Moderate aortic stenosis   Acute heart failure with preserved ejection fraction (HCC)    Hypothyroidism   Hypertension   CAD  Discharge Medications: Allergies as of 05/11/2022       Reactions   Acyclovir And Related    Metoprolol Itching   Food Rash, Other (See Comments)   Pt states that she is allergic to pickles.          Medication List     TAKE these medications    acetaminophen-codeine 300-30 MG tablet Commonly known as: TYLENOL #3 TAKE 1 TABLET EVERY 4 HOURS AS NEEDED FOR MODERATE PAIN. What changed:  how much to take how to take this when to take this reasons to take this additional instructions   albuterol 108 (90 Base) MCG/ACT inhaler Commonly known as: VENTOLIN HFA INHALE 2 PUFFS INTO THE LUNGS EVERY 6 HOURS AS NEEDED FOR WHEEZING OR SHORTNESS OF BREATH.   amLODipine 10 MG tablet Commonly known as: NORVASC TAKE 1 TABLET EVERY DAY   aspirin EC 81 MG tablet Take 81 mg by mouth daily.   atorvastatin 40 MG tablet Commonly known as: LIPITOR TAKE 1 TABLET (40 MG TOTAL) BY MOUTH DAILY.   Breo Ellipta 200-25 MCG/ACT Aepb Generic drug: fluticasone furoate-vilanterol INHALE 1 PUFF INTO THE LUNGS DAILY What changed: when to take this   brimonidine 0.2 % ophthalmic solution Commonly known as: ALPHAGAN Place 1 drop into the left eye daily.   carvedilol 25 MG tablet Commonly known as: COREG TAKE 1 TABLET BY MOUTH TWICE DAILY WITH A  MEAL   diclofenac Sodium 1 % Gel Commonly known as: VOLTAREN APPLY 2 GRAMS TOPICALLY 4 TIMES DAILY What changed: See the new instructions.   dorzolamide-timolol 2-0.5 % ophthalmic solution Commonly known as: COSOPT Place 1 drop into the left eye daily.   furosemide 40 MG tablet Commonly known as: Lasix Take 1 tablet (40 mg total) by mouth daily.   latanoprost 0.005 % ophthalmic solution Commonly known as: XALATAN Place 1 drop into the left eye daily.   levothyroxine 25 MCG tablet Commonly known as: SYNTHROID TAKE 1/2 TABLET EVERY DAY BEFORE BREAKFAST What changed: See the new instructions.   losartan 100 MG tablet Commonly known as: COZAAR TAKE 1 TABLET (100 MG TOTAL) BY MOUTH DAILY.   pantoprazole 40 MG tablet Commonly known as: PROTONIX TAKE 1 TABLET (40 MG TOTAL) BY MOUTH DAILY.   predniSONE 10 MG tablet Commonly known as: DELTASONE Take 1 tablet (10 mg total) by mouth daily.   VITAMIN B12 PO Take 1 tablet daily by mouth.               Durable Medical Equipment  (From admission, onward)           Start     Ordered   05/11/22 0000  For home use only DME 4 wheeled rolling walker with seat       Question:  Patient needs a walker to treat with the  following condition  Answer:  Physical deconditioning   05/11/22 1316   05/09/22 1558  For home use only DME 4 wheeled rolling walker with seat  Once       Question:  Patient needs a walker to treat with the following condition  Answer:  Gait instability   05/09/22 1557            Disposition and follow-up:   [name] was discharged from [facility] in Good condition.  At the hospital follow up visit please address:  1.    A. Acute on Chronic Hypoxic Respiratory Failure  - Suspect 2/2 HFpEF exacerbation, started on Lasix 40 mg daily, 1 day of prednisone 10 mg outpatient to finish taper   B. Aortic Stenosis  - Moderate AS on echo, needs outpatient cardiology f/u (Dr.  Gardiner Rhyme)  2.  Labs / imaging needed at time of follow-up: BMP  3.  Pending labs/ test needing follow-up: none  Follow-up Appointments:  Follow-up Information     [facility], [place] Follow up.   Specialty: Home Health Services Why: Someone will call you to schedule first home visit. If you have not received a call after two days of discharging home, call their number listed. If no one comes to assess, call Case Manager at [phone]. Contact information: [address] [place] [address] [phone]                - Please follow up with your primary care provider at the [facility] on 05/19/2022 at 10:15 AM.  Phone: [phone] Address: [facility], [place], [place], [place] [address]   - Please follow up with Dr. Gardiner Rhyme, Doreatha Martin, MD at [facility] at [facility] (please call to schedule an appointment) Phone: [phone] Address:  [address], [address] [place] [place], [place], [place] [address]   - Please follow up with Pulmonology Dr. Brand Males, MD at [place] at [facility] (please call to schedule an appointment) Phone: [phone] Address: [address] [place], [place],  [address]  Hospital Course by problem list:  [name] is a 87 y.o. with past medical history of restrictive lung disease, COPD, HTN, OSA, CAD, and aortic stenosis who presented with acute on chronic hypoxic respiratory failure and volume overload concerning for acute decompensated heart failure.    #Acute on chronic hypoxic respiratory failure #HFpEF exacerbation #Asthma / ILD exacerbation  #Chronic restrictive lung disease Presented w/ orthopnea, PND, JVD and lower extremity edema. Remained afebrile without leukocytosis. Flu, RSV, and COVID were negative. Had low suspicion for pneumonia. Has low lung volumes on CXR w/ possible element of obesity hypoventilation syndrome and  chronic left hemidiaphragm elevation. Repeat echo w/ EF 60-65%, mildly decreased from previous. Given signs of volume overload on exam and echo findings, suspected symptoms were secondary to acute decompensated heart failure at this time. Showed improvement of her breathing and lower extremity swelling with lasix. Was weaned back to her home O2 requirement (2L throughout the day and at night). Discharged on PO lasix 40 mg daily for further diuresis. Recommend reassessment of need for lasix in clinic as well as ability to tolerate SGLT2i +/- MRA. Patient to finish one day of prednisone 10 mg at home, started for possible ILD flare. Patient will f/u in clinic regarding her hospitalization. Patient aslo to follow-up with cardiology and pulmonology as outpatient.    #Aortic stenosis  Had no complaints of dizziness, lightheadedness, or syncope at home. Was noted to have 4/6 systolic murmur heard best at the right upper sternal border however audible in all regions of the chest. Echo from 07/2021 demonstrates moderate aortic stenosis w/ EF 65-70%.  Repeat echo w/ EF 60-65%, mild LVH, and moderate AS that will not require surgical intervention at this time. Patient to follow-up with cardiology as outpatient.    #Chest pain - resolved Patient reported burning, nonradiating, intermittent chest pain early during her hospitalization. She reports that this was similar to prior episodes of acid reflux. EKG and troponin WNL. Symptoms resolved w/ home pantoprazole 40 mg daily and tums. Suspect symptoms are likely secondary to acid reflux. Continued home pantoprazole 40 mg daily throughout her hospitalization and at discharge.   #History of hypothyroidism TSH from 11 days prior to admission was WNL. Continued home Synthroid 12.5 mcg daily throughout her hospitalization and at discharge.   #History of hypertension Initially held home losartan 100 mg. Restarted at discharge. Continued home Carvedilol 25 mg BID throughout  her hospitalization and at discharge.  #History of OSA Continued nightly CPAP throughout her hospitalization and at discharge.   #History of CAD Continue home Aspirin 81 mg and  Atorvastatin 40 mg throughout her hospitalization and at discharge.   Discharge Exam:   BP (!) 148/79 (BP Location: Right Arm)   Pulse 72   Temp 98 F (36.7 C) (Oral)   Resp 18   Ht '5\' 2"'$  (1.575 m)   Wt 107.6 kg   SpO2 97%   BMI 43.39 kg/m  Constitutional: Well-developed, well-nourished, appears comfortable  Cardiovascular: Regular rate, regular rhythm. 4/6 systolic murmur heard best at RUSB, No rubs or gallops. Normal radial and PT pulses bilaterally. 1+ bilateral lower extremity edema.  Pulmonary: Normal respiratory effort on 2L [place]. No rales, rhonchi, crackles, or wheezes.   Abdominal: Soft. Non-distended. No tenderness. Normal bowel sounds.  Musculoskeletal: Normal range of motion.     Neurological: Alert and oriented to person, place, and time. Non-focal. Skin: warm and dry.    Pertinent Labs, Studies, and Procedures:     Latest Ref Rng & Units 05/08/2022    7:12 AM 05/07/2022    6:50 PM 04/27/2022   11:35 AM  CBC  WBC 4.0 - 10.5 K/uL 6.1  7.0  7.6   Hemoglobin 12.0 - 15.0 g/dL 11.0  10.7  11.7   Hematocrit 36.0 - 46.0 % 33.1  33.8  36.3   Platelets 150 - 400 K/uL 197  134  215        Latest Ref Rng & Units 05/11/2022    6:33 AM 05/10/2022    6:59 AM 05/09/2022    3:46 AM  BMP  Glucose 70 - 99 mg/dL 114  109  166   BUN 8 - 23 mg/dL '18  16  16   '$ Creatinine 0.44 - 1.00 mg/dL 0.79  0.74  0.80   Sodium 135 - 145 mmol/L 139  138  136   Potassium 3.5 - 5.1 mmol/L 3.9  4.0  4.4   Chloride 98 - 111 mmol/L 99  101  100   CO2 22 - 32 mmol/L 32  28  31   Calcium 8.9 - 10.3 mg/dL 9.0  9.0  9.0     DG Chest Portable 1 View  IMPRESSION: Changes consistent with mild CHF.   On: 05/07/2022 19:05  ECHOCARDIOGRAM COMPLETE  IMPRESSIONS    1. Poor acoustic windows Unable to assess regional  wall motion. .  Left  ventricular ejection fraction, by estimation, is 60 to 65%. The left  ventricle has normal function. The left ventricle has no regional wall  motion abnormalities. There is mild left  ventricular hypertrophy. Left ventricular diastolic parameters are  indeterminate.   2. Right ventricular systolic function is normal. The right ventricular  size is normal.   3. No evidence of mitral valve regurgitation.   4. AV is thickened, calcified with restricted motion. Because of poor  acoustic window, valve is difficult to see. Peak and mean gradients  through the valve are 34 and 20 mm HG respectively AVA (VTI) 1.43 cm 2.  Dimensionless index is 0.39 consistent with   moderate AS. Compared to echo from May 2023, mean gradient is increased  (11 to 20 mm HG) but dimensionless index is overall unchanged (0.35 to  0.39) . The aortic valve was not well visualized. Aortic valve  regurgitation is not visualized.   5. The inferior vena cava is normal in size with greater than 50%  respiratory variability, suggesting right atrial pressure of 3 mmHg.    On: 05/09/2022  Discharge Instructions: Discharge Instructions     Call MD for:  difficulty breathing, headache or visual disturbances   Complete by: As directed    Call MD for:  extreme fatigue   Complete by: As directed    Call MD for:  persistant dizziness or light-headedness   Complete by: As directed    Diet - low sodium heart healthy   Complete by: As directed    Face-to-face encounter (required for Medicare/Medicaid patients)   Complete by: As directed    I Reynard Christoffersen certify that this patient is under my care and that I, or a nurse practitioner or physician's assistant working with me, had a face-to-face encounter that meets the physician face-to-face encounter requirements with this patient on 05/10/2022. The encounter with the patient was in whole, or in part for the following medical condition(s) which is the primary reason for home health  care (List medical condition): physical deconditioning   The encounter with the patient was in whole, or in part, for the following medical condition, which is the primary reason for home health care: physical deconditioning   I certify that, based on my findings, the following services are medically necessary home health services: Physical therapy   Reason for Medically Necessary Home Health Services: Therapy- Personnel officer, Public librarian   My clinical findings support the need for the above services: Shortness of breath with activity   Further, I certify that my clinical findings support that this patient is homebound due to: Unsafe ambulation due to balance issues   For home use only DME 4 wheeled rolling walker with seat   Complete by: As directed    Patient needs a walker to treat with the following condition: Physical deconditioning   Home Health   Complete by: As directed    To provide the following care/treatments:  PT OT     Increase activity slowly   Complete by: As directed    No wound care   Complete by: As directed       You were hospitalized for worsening shortness of breath.   Hospital Course: We suspect that your shortness of breath was due to a heart failure exacerbation. We also noted that you have a heart murmur due to tightness of one of your heart valves. We want you to be followed by cardiology outside  of the hospital to discuss heart failure and to keep track of how your heart valves are working. We are discharging you with one more day of steroids to help with your breathing. We are discharging you on lasix to decrease the amount of fluid in your body which we believe will help your breathing. Please follow up with your primary care doctor and cardiology as stated below to discuss these changes. Please follow up with pulmonology as well to discuss your recent hospitalization.    Medications:   Please start taking: -Lasix 40 mg  daily -Prednisone 10 mg for 1 day   Physical Therapy/Occupational Therapy: home health physical therapy and occupational therapy will come to your home to help build up your strength and help you navigate your house. We also discharged you with a walker to help with getting around.    Follow-up: - Please follow up with your primary care provider at the [facility] on 05/19/2022 at 10:15 AM.  Phone: [phone] Address: [facility], [place], [place], [place] [address]   - Please follow up with cardiology Dr. Donato Heinz, MD at [facility] at [facility] (please call to schedule an appointment) Phone: [phone] Address:  [address] [place], [place], [place] [address]   - Please follow up with Pulmonology Dr. Brand Males, MD at [place] at [facility] (please call to schedule an appointment) Phone: [phone] Address: [address], [place], [place], [place] [address]  Signed: [name], MD 05/11/2022, 1:17 PM   Pager: [phone]

## 2022-05-11 NOTE — Progress Notes (Signed)
Occupational Therapy Treatment Patient Details Name: [name] MRN: [id number] DOB: Dec 04, 1935 Today's Date: 05/11/2022   History of present illness Pt is an 87 y.o. female who presented 05/07/22 with worsening SOB. Admitted with acute on chronic hypoxic respiratory failure and volume overload concerning for mild acute decompensated heart failure. PMH: asthma, cancer, HLD, HTN, hypothyroidism, murmur, OA, restrictive lung disease   OT comments  Pt making excellent progress towards OT goals. Pt able to manage bathroom mobility with RW, toileting tasks and bathing standing at sink > 5 min with Supervision to Modified Independence. Pt reports typically removing O2 to go to bathroom,denied SOB and opted to keep off for bathing. On return to recliner, SpO2 85-88% on RA with improvements once replaced with 1 L O2. Emphasis on obtaining pulse ox to monitor O2 at home and continued incentive spirometer use(up to 581m). DC recs remain appropriate.   Recommendations for follow up therapy are one component of a multi-disciplinary discharge planning process, led by the attending physician.  Recommendations may be updated based on patient status, additional functional criteria and insurance authorization.    Follow Up Recommendations  Home health OT     Assistance Recommended at Discharge Set up Supervision/Assistance  Patient can return home with the following  A little help with bathing/dressing/bathroom;Assistance with cooking/housework   Equipment Recommendations  None recommended by OT    Recommendations for Other Services      Precautions / Restrictions Precautions Precautions: Fall;Other (comment) Precaution Comments: watch SpO2 (1-2L/min supplemental O2 PRN vs nighttime use only per pt) Restrictions Weight Bearing Restrictions: No       Mobility Bed Mobility Overal bed mobility: Modified Independent                  Transfers Overall transfer level: Modified  independent Equipment used: Rolling walker (2 wheels) Transfers: Sit to/from Stand Sit to Stand: Modified independent (Device/Increase time)           General transfer comment: standing from bedside and toilet     Balance Overall balance assessment: Needs assistance Sitting-balance support: No upper extremity supported, Feet supported Sitting balance-Leahy Scale: Good     Standing balance support: Bilateral upper extremity supported, During functional activity Standing balance-Leahy Scale: Fair                             ADL either performed or assessed with clinical judgement   ADL Overall ADL's : Needs assistance/impaired     Grooming: Modified independent;Standing;Oral care   Upper Body Bathing: Modified independent;Standing   Lower Body Bathing: Supervison/ safety;Sit to/from stand Lower Body Bathing Details (indicate cue type and reason): for safety standing at sink, bending down to wash legs without LOB Upper Body Dressing : Sitting;Modified independent       Toilet Transfer: Supervision/safety;Ambulation;Rolling walker (2 wheels) Toilet Transfer Details (indicate cue type and reason): BSC over toilet to increase ease of transfers; hat that OT placed in toilet in prior session was on shower floor - unable to place in toilet in time for measurement due to urinary urgency Toileting- Clothing Manipulation and Hygiene: Modified independent;Sitting/lateral lean;Sit to/from stand       Functional mobility during ADLs: Supervision/safety;Rolling walker (2 wheels) General ADL Comments: Pt denies SOB with activity and opted to complete ADLs on RA. Once in chair and NT entering for vitals, SpO2 at 85% on RA    Extremity/Trunk Assessment Upper Extremity Assessment [facility] Extremity Assessment: [facility]  for tasks assessed   Lower Extremity Assessment Lower Extremity Assessment: Defer to PT evaluation        Vision   Vision Assessment?: No apparent  visual deficits   Perception     Praxis      Cognition Arousal/Alertness: Awake/alert Behavior During Therapy: WFL for tasks assessed/performed Overall Cognitive Status: Within Functional Limits for tasks assessed                                          Exercises      Shoulder Instructions       General Comments Reminded pt of IS exercises with pt completing on OT return with warm blankt - up to 540m    Pertinent Vitals/ Pain       Pain Assessment Pain Assessment: No/denies pain  Home Living                                          Prior Functioning/Environment              Frequency  Min 2X/week        Progress Toward Goals  OT Goals(current goals can now be found in the care plan section)  Progress towards OT goals: Progressing toward goals  Acute Rehab OT Goals Patient Stated Goal: home when ready OT Goal Formulation: With patient Time For Goal Achievement: 05/23/22 Potential to Achieve Goals: Good ADL Goals Pt Will Perform Lower Body Dressing: with modified independence;sit to/from stand Pt Will Transfer to Toilet: with modified independence;ambulating Additional ADL Goal #1: Pt to verbalize at least 3 energy conservation strategies to implement in home environment Additional ADL Goal #2: Pt to increase standing tolerance > 7 min during ADLs/mobility to maximize endurance  Plan Discharge plan remains appropriate    Co-evaluation                 AM-PAC OT "6 Clicks" Daily Activity     Outcome Measure   Help from another person eating meals?: None Help from another person taking care of personal grooming?: None Help from another person toileting, which includes using toliet, bedpan, or urinal?: A Little Help from another person bathing (including washing, rinsing, drying)?: A Little Help from another person to put on and taking off regular upper body clothing?: None Help from another person to put on  and taking off regular lower body clothing?: A Little 6 Click Score: 21    End of Session Equipment Utilized During Treatment: Rolling walker (2 wheels);Oxygen  OT Visit Diagnosis: Unsteadiness on feet (R26.81);Other abnormalities of gait and mobility (R26.89)   Activity Tolerance Patient tolerated treatment well   Patient Left in chair;with call bell/phone within reach;Other (comment) (chair pad was on floor in room; noted after pt sat in recliner; NT present, discussed and pt not a safety concern or impulsive)   Nurse Communication Mobility status;Other (comment) (O2, urine output)        Time: [id number] Time Calculation (min): 25 min  Charges: OT General Charges $OT Visit: 1 Visit OT Treatments $Self Care/Home Management : 23-37 mins  JMalachy Chamber OTR/L Acute Rehab Services Office: [phone]  JLayla Maw3/08/2022, 8:41 AM

## 2022-05-11 NOTE — Progress Notes (Signed)
Mobility Specialist Progress Note   05/11/22 1128  Mobility  Activity Ambulated with assistance in hallway  Level of Assistance Standby assist, set-up cues, supervision of patient - no hands on  Assistive Device Four wheel walker (Rollator)  Distance Ambulated (ft) 240 ft  Activity Response Tolerated well  Mobility Referral Yes  $Mobility charge 1 Mobility   Pre Mobility: 78 HR, 93% SpO2  on 1LO2 During Mobility: 94 HR, 89% SpO2 on 1LO2 Post Mobility: 88 HR, 91% SpO2 on 1LO2  Received in bed c/o L lower leg cramping, performed stretches for relief, pt then agreeable.  Requesting to use BR before session, no faults on transfer. Ambulated w/ a steady gait on 1LO2. X1 seated break d/t SOB, SpO2 sitting at 89% but quickly recovering w/ PLB. Returned back to chair w/o fault call bell in reach and tray in front.    Holland Falling Mobility Specialist Please contact via SecureChat or  Rehab office at [phone]

## 2022-05-12 ENCOUNTER — Telehealth: Payer: Self-pay

## 2022-05-12 ENCOUNTER — Other Ambulatory Visit: Payer: Self-pay | Admitting: Internal Medicine

## 2022-05-12 DIAGNOSIS — J849 Interstitial pulmonary disease, unspecified: Secondary | ICD-10-CM | POA: Diagnosis not present

## 2022-05-12 DIAGNOSIS — J9621 Acute and chronic respiratory failure with hypoxia: Secondary | ICD-10-CM | POA: Diagnosis not present

## 2022-05-12 DIAGNOSIS — J45909 Unspecified asthma, uncomplicated: Secondary | ICD-10-CM | POA: Diagnosis not present

## 2022-05-12 DIAGNOSIS — I272 Pulmonary hypertension, unspecified: Secondary | ICD-10-CM | POA: Diagnosis not present

## 2022-05-12 DIAGNOSIS — G4733 Obstructive sleep apnea (adult) (pediatric): Secondary | ICD-10-CM | POA: Diagnosis not present

## 2022-05-12 DIAGNOSIS — I2510 Atherosclerotic heart disease of native coronary artery without angina pectoris: Secondary | ICD-10-CM | POA: Diagnosis not present

## 2022-05-12 DIAGNOSIS — I5031 Acute diastolic (congestive) heart failure: Secondary | ICD-10-CM | POA: Diagnosis not present

## 2022-05-12 DIAGNOSIS — I110 Hypertensive heart disease with heart failure: Secondary | ICD-10-CM | POA: Diagnosis not present

## 2022-05-12 DIAGNOSIS — I350 Nonrheumatic aortic (valve) stenosis: Secondary | ICD-10-CM | POA: Diagnosis not present

## 2022-05-12 NOTE — Transitions of Care (Post Inpatient/ED Visit) (Signed)
   05/12/2022  Name: [name] MRN: [id number] DOB: 1935-07-02  Today's TOC FU Call Status: Today's TOC FU Call Status:: Successful TOC FU Call Competed TOC FU Call Complete Date: 05/12/22  Transition Care Management Follow-up Telephone Call Date of Discharge: 05/11/22 Discharge Facility: [facility]) Type of Discharge: Inpatient Admission Primary Inpatient Discharge Diagnosis:: Acute on Chronic Respiratory Failure How have you been since you were released from the hospital?: Better Any questions or concerns?: No (Home health nurse came today and went over all her medications and answered her questions.)  Items Reviewed: Did you receive and understand the discharge instructions provided?: Yes Medications obtained and verified?: Yes (Medications Reviewed) Any new allergies since your discharge?: No Dietary orders reviewed?: NA Do you have support at home?: Yes People in Home: grandchild(ren), child(ren), adult Name of Support/Comfort Primary Source: son- [place] and Equipment/Supplies: [place] Ordered?: Yes Name of [place]:: [place] set up a time to come to your home?: Yes (Nurse visit today) [place] Visit Date: 05/12/22 Any new equipment or medical supplies ordered?: Yes (Rolling walker with seat) Name of Medical supply agency?: [employer] Do you have any questions related to the use of the equipment/supplies?: No  Functional Questionnaire: Do you need assistance with bathing/showering or dressing?: No Do you need assistance with meal preparation?: Yes Do you need assistance with eating?: No Do you have difficulty maintaining continence: No Do you need assistance with getting out of bed/getting out of a chair/moving?: No Do you have difficulty managing or taking your medications?: Yes  Folllow up appointments reviewed: PCP Follow-up appointment confirmed?: Yes Date of PCP follow-up appointment?: 05/19/22 Follow-up  Provider: Dr. Jodi Mourning Specialist [facility] Follow-up appointment confirmed?: NA Do you need transportation to your follow-up appointment?: No Do you understand care options if your condition(s) worsen?: Yes-patient verbalized understanding  SDOH Interventions Today    Flowsheet Row Most Recent Value  SDOH Interventions   Housing Interventions Intervention Not Indicated  Utilities Interventions Intervention Not Indicated      Johnney Killian, RN, BSN, CCM Care Management Coordinator [facility] Phone: [phone]: [phone]

## 2022-05-13 ENCOUNTER — Telehealth: Payer: Self-pay | Admitting: *Deleted

## 2022-05-13 NOTE — Telephone Encounter (Signed)
Received call from [place], [place] with [place]. Requesting VO for [facility] skilled nursing 1 week 5 and every other week for 4 weeks to work on heart failure education. Verbal auth given. Will route to PCP for agreement/denial.

## 2022-05-16 NOTE — Progress Notes (Signed)
CC: Hospital Follow-up  HPI:   [name] is a 87 y.o. female with a past medical history of obesity III, hypertension, hyperlipidemia, hypothyroidism, asthma, CHF, and breast cancer 2008 who presents for hospital follow-up. She was admitted on 3-2 for acute hypoxic respiratory failure secondary to CHF exacerbation, responded well to diuresis, and discharged 3-6 with prescription for furosemide '40mg'$  daily. Last seen at [facility] in 04-2022.   Past Medical History:  Diagnosis Date   Allergic rhinitis, cause unspecified    Asthma    Cancer ([place])    CHF exacerbation ([place]) 05/08/2022   Chronic back pain    Coronary artery calcification seen on CAT scan 04/21/2017   Enlarged pulmonary artery ([place]) 04/21/2017   By chest CT   Frequent urination 11/14/2017   GERD (gastroesophageal reflux disease) 05/19/2011   history of Bilateral leg edema-likely amlodipine induced. 07/28/2011   history of CAP (community acquired pneumonia) 07/14/2011   history of HYPOTHYROIDISM, BORDERLINE 03/20/2006   Annotation: Asymptomatic, untreated Qualifier: Diagnosis of  By: Tomasa Hosteller MD, Edmon Crape.    Hx of breast cancer    Hyperlipidemia    Hypertension    Hypothyroidism    Lumbago    Murmur, cardiac 11/22/2016   OBESITY NOS 03/20/2006   Qualifier: Diagnosis of  By: Tomasa Hosteller MD, Veronique D.    OSTEOARTHRITIS 12/13/2005   Annotation: bilateral knees;right knee injection 6/05 Qualifier: Diagnosis of  By: Tomasa Hosteller MD, Edmon Crape.    Pain, dental 09/18/2020   Preventative health care 09/04/2014   Pulmonary nodule 05/30/2017   4 mm right middle lobe nodule noted on high-resolution CT done on February 06, 2017   right shoulder pain, likely Impingement syndrome  09/09/2010   Upper respiratory infection 04/09/2018   VENOUS INSUFFICIENCY, CHRONIC 03/20/2006   Qualifier: Diagnosis of  By: Tomasa Hosteller MD, Edmon Crape.      Review of Systems:    Reports nocturnal dysuria Denies leg swelling, cough, breath shortness, orthopnea,  lightheadedness, dizziness, headache, blurry vision, chest pain, myalgias    Physical Exam:  Vitals:   05/19/22 1047 05/19/22 1113  BP: (!) 95/57 137/80  Pulse: 67 70  Temp: 98 F (36.7 C)   TempSrc: Oral   SpO2: 96%   Weight: 232 lb 3.2 oz (105.3 kg)     General:   awake and alert, sitting comfortably in wheelchair, cooperative and pleasant, not in acute distress Lungs:   normal respiratory effort, breathing unlabored, distant breath sounds, no crackles or wheezing Cardiac:   regular rate and rhythm, normal S1 and S2, crescendo-decrescendo mid-systolic murmur heard best at right sternal border consistent with AS, trace pitting edema in BLE Neurologic:   oriented to person-place-time, moving all extremities, no gross focal deficits Psychiatric:   euthymic mood with congruent affect, intelligible speech    Assessment & Plan:   Essential hypertension Patient has history of hypertension managed with amlodipine, carvedilol, and losartan. Reports daily adherence to these medications. Her BP today in clinic was initially 95/57, improved to 137/80 after drinking water. Denies lightheadedness, dizziness, blurry vision, headache, anxiety, chest pain, and palpitations. Given low initial BP and plan to add SGlT-2 inhibitor to regimen, we discussed decreasing dose of amlodipine with close follow-up.   - Continue carvedilol '25mg'$  q12 and losartan '100mg'$  q24 - Decrease amlodipine from '10mg'$  to '5mg'$  q24 - Recheck BP at next visit on 4-10    Acute heart failure with preserved ejection fraction [facility]) Patient has history of heart failure with preserved ejection fraction complicated by  chronic hypoxic respiratory failure. Recently 3-2 hospitalized for acute heart failure exacerbation and discharged with new prescription for furosemide, which she has been taking daily. Denies leg swelling, orthopnea, dyspnea, and chest pain. Exam notable only for trace pitting edema in BLE, which is her baseline. Given  new furosemide prescription, BMP is warranted to evaluate electrolytes and renal function. Although patient would benefit from SGlT-2 inhibitor given HFpEF diagnosis, her blood pressure was low today at 95/57. Referral to cardiology has been placed for moderate stenosis identified by echocardiogram during recent hospitalization.   - Continue furosemide '40mg'$  q24 - Check BMP for electrolytes and renal function - Consider SGlT-2 inhibitor at next visit if blood pressure normal - Referral to cardiology for new diagnosis of moderate aortic stenosis    Restrictive lung disease Patient has history of restrictive lung disease and breathes 2L/min supplemental oxygen at home. Also takes fluticasone-vilanterol daily and albuterol as needed. Discharged from hospital on 3-6 with prednisone taper, which she completed. Denies breath shortness, exertional dyspnea, cough, chills, fever, and chills. She rarely needs to take her albuterol. Follows with [facility] but had to cancel last visit scheduled for 1-30. Phone number for [facility] was provided and patient instructed to schedule another appointment.  - Continue fluticasone-vilanterol 200-25ug q24 and albuterol 108ug q6 PRN - Continue home supplemental oxygen at 2L/min - Schedule visit with [facility]    Hyperlipidemia Patient has history of hyperlipidemia managed with atorvastatin, which she takes daily. Denies myalgias and abdominal pain. Most recent lipid panel collected 05-2021 demonstrated LDL 65 and HDL 47.  - Check lipid panel     GERD (gastroesophageal reflux disease) Patient has history of gastroesophageal reflux disease and currently takes pantoprazole, which was recently weaned to every other day. She has been tolerating this adjustment well, continues to experience mild epigastric burning after eating certain foods.  - Continue pantoprazole '40mg'$  q48, consider decreasing at next visit     Nocturnal  polyuria Patient reports long history of nocturnal polyuria requiring her urinate throughout the night. She has tired avoiding fluids three hours before bed and denies caffeine use in the evening. Despite these changes, her symptoms persist. They have remained about the same since starting furosemide. Denies dysuria and discoloration. Low concern for infection at this time. Urinalysis ordered to evaluate for proteinuria, ketones, and glucose. Most recent A1C was 5.6 in 08-2021. History notable for mild sleep apnea diagnosed via study in 2023. Prescribed CPAP and uses this machine at night about 5x per week. Counseled on importance of wearing CPAP every day consistently, as this could potentially contribute to nocturnal polyuria. Discussed how to keep a bladder diary and template provided.   - Check A1C and urinalysis  - Encourage keeping voiding diary and bringing to next [facility] visit      See Encounters Tab for problem based charting.  Patient discussed with Dr.  Saverio Danker

## 2022-05-17 ENCOUNTER — Telehealth: Payer: Self-pay

## 2022-05-17 DIAGNOSIS — G4733 Obstructive sleep apnea (adult) (pediatric): Secondary | ICD-10-CM | POA: Diagnosis not present

## 2022-05-17 DIAGNOSIS — J849 Interstitial pulmonary disease, unspecified: Secondary | ICD-10-CM | POA: Diagnosis not present

## 2022-05-17 DIAGNOSIS — I2510 Atherosclerotic heart disease of native coronary artery without angina pectoris: Secondary | ICD-10-CM | POA: Diagnosis not present

## 2022-05-17 DIAGNOSIS — I350 Nonrheumatic aortic (valve) stenosis: Secondary | ICD-10-CM | POA: Diagnosis not present

## 2022-05-17 DIAGNOSIS — I272 Pulmonary hypertension, unspecified: Secondary | ICD-10-CM | POA: Diagnosis not present

## 2022-05-17 DIAGNOSIS — J9621 Acute and chronic respiratory failure with hypoxia: Secondary | ICD-10-CM | POA: Diagnosis not present

## 2022-05-17 DIAGNOSIS — I110 Hypertensive heart disease with heart failure: Secondary | ICD-10-CM | POA: Diagnosis not present

## 2022-05-17 DIAGNOSIS — J45909 Unspecified asthma, uncomplicated: Secondary | ICD-10-CM | POA: Diagnosis not present

## 2022-05-17 DIAGNOSIS — I5031 Acute diastolic (congestive) heart failure: Secondary | ICD-10-CM | POA: Diagnosis not present

## 2022-05-17 NOTE — Telephone Encounter (Signed)
[name] with [facility] requesting VO for PT. Please call pt back.

## 2022-05-18 DIAGNOSIS — I350 Nonrheumatic aortic (valve) stenosis: Secondary | ICD-10-CM | POA: Diagnosis not present

## 2022-05-18 DIAGNOSIS — I2510 Atherosclerotic heart disease of native coronary artery without angina pectoris: Secondary | ICD-10-CM | POA: Diagnosis not present

## 2022-05-18 DIAGNOSIS — J45909 Unspecified asthma, uncomplicated: Secondary | ICD-10-CM | POA: Diagnosis not present

## 2022-05-18 DIAGNOSIS — I110 Hypertensive heart disease with heart failure: Secondary | ICD-10-CM | POA: Diagnosis not present

## 2022-05-18 DIAGNOSIS — I5031 Acute diastolic (congestive) heart failure: Secondary | ICD-10-CM | POA: Diagnosis not present

## 2022-05-18 DIAGNOSIS — J849 Interstitial pulmonary disease, unspecified: Secondary | ICD-10-CM | POA: Diagnosis not present

## 2022-05-18 DIAGNOSIS — J9621 Acute and chronic respiratory failure with hypoxia: Secondary | ICD-10-CM | POA: Diagnosis not present

## 2022-05-18 DIAGNOSIS — G4733 Obstructive sleep apnea (adult) (pediatric): Secondary | ICD-10-CM | POA: Diagnosis not present

## 2022-05-18 DIAGNOSIS — I272 Pulmonary hypertension, unspecified: Secondary | ICD-10-CM | POA: Diagnosis not present

## 2022-05-18 NOTE — Telephone Encounter (Signed)
Received call from Gennaro Africa, PT with [facility]. Requesting VO for HH PT 1 week 8 to work on endurance and breathing. Verbal auth given. Will route to PCP for agreement/denial.

## 2022-05-18 NOTE — Telephone Encounter (Signed)
Returned call to Anda Kraft, PT with [facility]. No answer. Left message on VM requesting return call.

## 2022-05-19 ENCOUNTER — Ambulatory Visit (INDEPENDENT_AMBULATORY_CARE_PROVIDER_SITE_OTHER): Payer: Medicare HMO | Admitting: Student

## 2022-05-19 VITALS — BP 137/80 | HR 70 | Temp 98.0°F | Wt 232.2 lb

## 2022-05-19 DIAGNOSIS — R3581 Nocturnal polyuria: Secondary | ICD-10-CM

## 2022-05-19 DIAGNOSIS — E669 Obesity, unspecified: Secondary | ICD-10-CM

## 2022-05-19 DIAGNOSIS — R351 Nocturia: Secondary | ICD-10-CM

## 2022-05-19 DIAGNOSIS — I110 Hypertensive heart disease with heart failure: Secondary | ICD-10-CM

## 2022-05-19 DIAGNOSIS — K219 Gastro-esophageal reflux disease without esophagitis: Secondary | ICD-10-CM

## 2022-05-19 DIAGNOSIS — I5033 Acute on chronic diastolic (congestive) heart failure: Secondary | ICD-10-CM

## 2022-05-19 DIAGNOSIS — E785 Hyperlipidemia, unspecified: Secondary | ICD-10-CM

## 2022-05-19 DIAGNOSIS — I5031 Acute diastolic (congestive) heart failure: Secondary | ICD-10-CM

## 2022-05-19 DIAGNOSIS — J984 Other disorders of lung: Secondary | ICD-10-CM | POA: Diagnosis not present

## 2022-05-19 DIAGNOSIS — I10 Essential (primary) hypertension: Secondary | ICD-10-CM

## 2022-05-19 DIAGNOSIS — E7800 Pure hypercholesterolemia, unspecified: Secondary | ICD-10-CM

## 2022-05-19 DIAGNOSIS — I5032 Chronic diastolic (congestive) heart failure: Secondary | ICD-10-CM

## 2022-05-19 DIAGNOSIS — I5030 Unspecified diastolic (congestive) heart failure: Secondary | ICD-10-CM

## 2022-05-19 MED ORDER — AMLODIPINE BESYLATE 5 MG PO TABS: 5.0000 mg | ORAL_TABLET | Freq: Every day | ORAL | 11 refills | Status: DC

## 2022-05-19 NOTE — Assessment & Plan Note (Deleted)
Patient has history of heart failure with preserved ejection fraction complicated by chronic hypoxic respiratory failure. Recently 3-2 hospitalized for acute heart failure exacerbation and discharged with new prescription for furosemide, which she has been taking daily. Denies leg swelling, orthopnea, dyspnea, and chest pain. Exam notable only for trace pitting edema in BLE, which is her baseline. Given new furosemide prescription, BMP is warranted to evaluate electrolytes and renal function. Although patient would benefit from SGlT-2 inhibitor given HFpEF diagnosis, her blood pressure was low today at 95/57. Referral to cardiology has been placed for moderate stenosis identified by echocardiogram during recent hospitalization.   - Continue furosemide '40mg'$  q24 - Check BMP for electrolytes and renal function - Consider SGlT-2 inhibitor at next visit if blood pressure normal - Referral to cardiology for new diagnosis of moderate aortic stenosis

## 2022-05-19 NOTE — Assessment & Plan Note (Signed)
Patient has history of gastroesophageal reflux disease and currently takes pantoprazole, which was recently weaned to every other day. She has been tolerating this adjustment well, continues to experience mild epigastric burning after eating certain foods.  - Continue pantoprazole '40mg'$  q48, consider decreasing at next visit

## 2022-05-19 NOTE — Assessment & Plan Note (Signed)
Patient has history of hyperlipidemia managed with atorvastatin, which she takes daily. Denies myalgias and abdominal pain. Most recent lipid panel collected 05-2021 demonstrated LDL 65 and HDL 47.  - Check lipid panel

## 2022-05-19 NOTE — Assessment & Plan Note (Signed)
Patient has history of restrictive lung disease and breathes 2L/min supplemental oxygen at home. Also takes fluticasone-vilanterol daily and albuterol as needed. Discharged from hospital on 3-6 with prednisone taper, which she completed. Denies breath shortness, exertional dyspnea, cough, chills, fever, and chills. She rarely needs to take her albuterol. Follows with [facility] but had to cancel last visit scheduled for 1-30. Phone number for [facility] was provided and patient instructed to schedule another appointment.  - Continue fluticasone-vilanterol 200-25ug q24 and albuterol 108ug q6 PRN - Continue home supplemental oxygen at 2L/min - Schedule visit with [facility]

## 2022-05-19 NOTE — Assessment & Plan Note (Signed)
Patient has history of heart failure with preserved ejection fraction complicated by chronic hypoxic respiratory failure. Recently 3-2 hospitalized for acute heart failure exacerbation and discharged with new prescription for furosemide, which she has been taking daily. Denies leg swelling, orthopnea, dyspnea, and chest pain. Exam notable only for trace pitting edema in BLE, which is her baseline. Given new furosemide prescription, BMP is warranted to evaluate electrolytes and renal function. Although patient would benefit from SGlT-2 inhibitor given HFpEF diagnosis, her blood pressure was low today at 95/57. Referral to cardiology has been placed for moderate stenosis identified by echocardiogram during recent hospitalization.   - Continue furosemide '40mg'$  q24 - Check BMP for electrolytes and renal function - Consider SGlT-2 inhibitor at next visit if blood pressure normal - Referral to cardiology for new diagnosis of moderate aortic stenosis

## 2022-05-19 NOTE — Patient Instructions (Signed)
  Thank you, Ms.[name], for allowing [place] to provide your care today. Today we discussed . . .  > Heart Failure       - please continue to take your furosemide and wear your compression socks       - we would like to potentially start a new medication but need to improve your blood pressure first       - decrease your amlodipine dose from 10mg  to 5mg  and return to clinic on 4-10 for a blood pressure recheck  > Restrictive lung disease       - continue to use your Breo inhaler once per day and albuterol as needed       - please schedule a visit with [facility] by calling this number: [phone]  > Nocturnal frequent urination        - please start keeping a bladder diary that can help [place] track your urination at night       - we ordered a urine test and will call you with the results    I have ordered the following labs for you:  Lab Orders         BMP8+Anion Gap         Hemoglobin A1c         Lipid Profile         Urinalysis, Reflex Microscopic       Tests ordered today:  none   Referrals ordered today:   Referral Orders         Ambulatory referral to Cardiology       I have ordered the following medication/changed the following medications:   Stop the following medications: There are no discontinued medications.   Start the following medications: No orders of the defined types were placed in this encounter.     Follow up:  1 month     Remember:  Please start taking amlodipine 5mg  instead of 10mg , which should help increase your blood pressure. In addition, schedule a visit with your pulmonologist by calling the number [phone]. Finally, we recommend keeping a bladder diary to record drinking and urinating patterns. We will see you again in about one month!   Should you have any questions or concerns please call the internal medicine clinic at [phone].     Roswell Nickel, MD [place]

## 2022-05-19 NOTE — Assessment & Plan Note (Signed)
Patient reports long history of nocturnal polyuria requiring her urinate throughout the night. She has tired avoiding fluids three hours before bed and denies caffeine use in the evening. Despite these changes, her symptoms persist. They have remained about the same since starting furosemide. Denies dysuria and discoloration. Low concern for infection at this time. Urinalysis ordered to evaluate for proteinuria, ketones, and glucose. Most recent A1C was 5.6 in 08-2021. History notable for mild sleep apnea diagnosed via study in 2023. Prescribed CPAP and uses this machine at night about 5x per week. Counseled on importance of wearing CPAP every day consistently, as this could potentially contribute to nocturnal polyuria. Discussed how to keep a bladder diary and template provided.   - Check A1C and urinalysis  - Encourage keeping voiding diary and bringing to next [facility] visit

## 2022-05-19 NOTE — Assessment & Plan Note (Signed)
Patient has history of hypertension managed with amlodipine, carvedilol, and losartan. Reports daily adherence to these medications. Her BP today in clinic was initially 95/57, improved to 137/80 after drinking water. Denies lightheadedness, dizziness, blurry vision, headache, anxiety, chest pain, and palpitations. Given low initial BP and plan to add SGlT-2 inhibitor to regimen, we discussed decreasing dose of amlodipine with close follow-up.   - Continue carvedilol '25mg'$  q12 and losartan '100mg'$  q24 - Decrease amlodipine from '10mg'$  to '5mg'$  q24 - Recheck BP at next visit on 4-10

## 2022-05-20 ENCOUNTER — Other Ambulatory Visit: Payer: Self-pay

## 2022-05-20 DIAGNOSIS — M5441 Lumbago with sciatica, right side: Secondary | ICD-10-CM

## 2022-05-20 DIAGNOSIS — I2510 Atherosclerotic heart disease of native coronary artery without angina pectoris: Secondary | ICD-10-CM | POA: Diagnosis not present

## 2022-05-20 DIAGNOSIS — J9621 Acute and chronic respiratory failure with hypoxia: Secondary | ICD-10-CM | POA: Diagnosis not present

## 2022-05-20 DIAGNOSIS — G4733 Obstructive sleep apnea (adult) (pediatric): Secondary | ICD-10-CM | POA: Diagnosis not present

## 2022-05-20 DIAGNOSIS — J45909 Unspecified asthma, uncomplicated: Secondary | ICD-10-CM | POA: Diagnosis not present

## 2022-05-20 DIAGNOSIS — J849 Interstitial pulmonary disease, unspecified: Secondary | ICD-10-CM | POA: Diagnosis not present

## 2022-05-20 DIAGNOSIS — I350 Nonrheumatic aortic (valve) stenosis: Secondary | ICD-10-CM | POA: Diagnosis not present

## 2022-05-20 DIAGNOSIS — I5031 Acute diastolic (congestive) heart failure: Secondary | ICD-10-CM | POA: Diagnosis not present

## 2022-05-20 DIAGNOSIS — I272 Pulmonary hypertension, unspecified: Secondary | ICD-10-CM | POA: Diagnosis not present

## 2022-05-20 DIAGNOSIS — I110 Hypertensive heart disease with heart failure: Secondary | ICD-10-CM | POA: Diagnosis not present

## 2022-05-20 LAB — URINALYSIS, ROUTINE W REFLEX MICROSCOPIC
Bilirubin, UA: NEGATIVE
Glucose, UA: NEGATIVE
Ketones, UA: NEGATIVE
Leukocytes,UA: NEGATIVE
Nitrite, UA: NEGATIVE
Protein,UA: NEGATIVE
RBC, UA: NEGATIVE
Specific Gravity, UA: 1.008 (ref 1.005–1.030)
Urobilinogen, Ur: 0.2 mg/dL (ref 0.2–1.0)
pH, UA: 7 (ref 5.0–7.5)

## 2022-05-20 LAB — LIPID PANEL
Chol/HDL Ratio: 2 ratio (ref 0.0–4.4)
Cholesterol, Total: 119 mg/dL (ref 100–199)
HDL: 60 mg/dL (ref >39)
LDL Chol Calc (NIH): 45 mg/dL (ref 0–99)
Triglycerides: 69 mg/dL (ref 0–149)
VLDL Cholesterol Cal: 14 mg/dL (ref 5–40)

## 2022-05-20 LAB — BMP8+ANION GAP
Anion Gap: 16 mmol/L (ref 10.0–18.0)
BUN/Creatinine Ratio: 13 (ref 12–28)
BUN: 13 mg/dL (ref 8–27)
CO2: 29 mmol/L (ref 20–29)
Calcium: 10.3 mg/dL (ref 8.7–10.3)
Chloride: 95 mmol/L — ABNORMAL LOW (ref 96–106)
Creatinine, Ser: 0.97 mg/dL (ref 0.57–1.00)
Glucose: 127 mg/dL — ABNORMAL HIGH (ref 70–99)
Potassium: 3.7 mmol/L (ref 3.5–5.2)
Sodium: 140 mmol/L (ref 134–144)
eGFR: 58 mL/min/{1.73_m2} — ABNORMAL LOW (ref >59)

## 2022-05-20 LAB — HEMOGLOBIN A1C
Est. average glucose Bld gHb Est-mCnc: 126 mg/dL
Hgb A1c MFr Bld: 6 % — ABNORMAL HIGH (ref 4.8–5.6)

## 2022-05-20 MED ORDER — ACETAMINOPHEN-CODEINE 300-30 MG PO TABS: ORAL_TABLET | ORAL | 0 refills | Status: DC

## 2022-05-20 NOTE — Addendum Note (Signed)
Addended by: Charise Killian on: 05/20/2022 11:21 AM   Modules accepted: Level of Service

## 2022-05-20 NOTE — Progress Notes (Signed)
Internal Medicine Clinic Attending  Case discussed with Dr. Harper  At the time of the visit.  We reviewed the resident's history and exam and pertinent patient test results.  I agree with the assessment, diagnosis, and plan of care documented in the resident's note.  

## 2022-05-24 DIAGNOSIS — I110 Hypertensive heart disease with heart failure: Secondary | ICD-10-CM | POA: Diagnosis not present

## 2022-05-24 DIAGNOSIS — J45909 Unspecified asthma, uncomplicated: Secondary | ICD-10-CM | POA: Diagnosis not present

## 2022-05-24 DIAGNOSIS — I2510 Atherosclerotic heart disease of native coronary artery without angina pectoris: Secondary | ICD-10-CM | POA: Diagnosis not present

## 2022-05-24 DIAGNOSIS — J849 Interstitial pulmonary disease, unspecified: Secondary | ICD-10-CM | POA: Diagnosis not present

## 2022-05-24 DIAGNOSIS — I350 Nonrheumatic aortic (valve) stenosis: Secondary | ICD-10-CM | POA: Diagnosis not present

## 2022-05-24 DIAGNOSIS — G4733 Obstructive sleep apnea (adult) (pediatric): Secondary | ICD-10-CM | POA: Diagnosis not present

## 2022-05-24 DIAGNOSIS — I5031 Acute diastolic (congestive) heart failure: Secondary | ICD-10-CM | POA: Diagnosis not present

## 2022-05-24 DIAGNOSIS — J9621 Acute and chronic respiratory failure with hypoxia: Secondary | ICD-10-CM | POA: Diagnosis not present

## 2022-05-24 DIAGNOSIS — I272 Pulmonary hypertension, unspecified: Secondary | ICD-10-CM | POA: Diagnosis not present

## 2022-05-25 DIAGNOSIS — I5031 Acute diastolic (congestive) heart failure: Secondary | ICD-10-CM | POA: Diagnosis not present

## 2022-05-25 DIAGNOSIS — I272 Pulmonary hypertension, unspecified: Secondary | ICD-10-CM | POA: Diagnosis not present

## 2022-05-25 DIAGNOSIS — G4733 Obstructive sleep apnea (adult) (pediatric): Secondary | ICD-10-CM | POA: Diagnosis not present

## 2022-05-25 DIAGNOSIS — J849 Interstitial pulmonary disease, unspecified: Secondary | ICD-10-CM | POA: Diagnosis not present

## 2022-05-25 DIAGNOSIS — J9621 Acute and chronic respiratory failure with hypoxia: Secondary | ICD-10-CM | POA: Diagnosis not present

## 2022-05-25 DIAGNOSIS — I350 Nonrheumatic aortic (valve) stenosis: Secondary | ICD-10-CM | POA: Diagnosis not present

## 2022-05-25 DIAGNOSIS — J45909 Unspecified asthma, uncomplicated: Secondary | ICD-10-CM | POA: Diagnosis not present

## 2022-05-25 DIAGNOSIS — I110 Hypertensive heart disease with heart failure: Secondary | ICD-10-CM | POA: Diagnosis not present

## 2022-05-25 DIAGNOSIS — I2510 Atherosclerotic heart disease of native coronary artery without angina pectoris: Secondary | ICD-10-CM | POA: Diagnosis not present

## 2022-05-30 DIAGNOSIS — J849 Interstitial pulmonary disease, unspecified: Secondary | ICD-10-CM | POA: Diagnosis not present

## 2022-05-30 DIAGNOSIS — I350 Nonrheumatic aortic (valve) stenosis: Secondary | ICD-10-CM | POA: Diagnosis not present

## 2022-05-30 DIAGNOSIS — I5031 Acute diastolic (congestive) heart failure: Secondary | ICD-10-CM | POA: Diagnosis not present

## 2022-05-30 DIAGNOSIS — I110 Hypertensive heart disease with heart failure: Secondary | ICD-10-CM | POA: Diagnosis not present

## 2022-05-30 DIAGNOSIS — J9621 Acute and chronic respiratory failure with hypoxia: Secondary | ICD-10-CM | POA: Diagnosis not present

## 2022-05-30 DIAGNOSIS — I272 Pulmonary hypertension, unspecified: Secondary | ICD-10-CM | POA: Diagnosis not present

## 2022-05-30 DIAGNOSIS — I2510 Atherosclerotic heart disease of native coronary artery without angina pectoris: Secondary | ICD-10-CM | POA: Diagnosis not present

## 2022-05-30 DIAGNOSIS — J45909 Unspecified asthma, uncomplicated: Secondary | ICD-10-CM | POA: Diagnosis not present

## 2022-05-30 DIAGNOSIS — G4733 Obstructive sleep apnea (adult) (pediatric): Secondary | ICD-10-CM | POA: Diagnosis not present

## 2022-05-31 DIAGNOSIS — I272 Pulmonary hypertension, unspecified: Secondary | ICD-10-CM | POA: Diagnosis not present

## 2022-05-31 DIAGNOSIS — J9621 Acute and chronic respiratory failure with hypoxia: Secondary | ICD-10-CM | POA: Diagnosis not present

## 2022-05-31 DIAGNOSIS — J45909 Unspecified asthma, uncomplicated: Secondary | ICD-10-CM | POA: Diagnosis not present

## 2022-05-31 DIAGNOSIS — I5031 Acute diastolic (congestive) heart failure: Secondary | ICD-10-CM | POA: Diagnosis not present

## 2022-05-31 DIAGNOSIS — G4733 Obstructive sleep apnea (adult) (pediatric): Secondary | ICD-10-CM | POA: Diagnosis not present

## 2022-05-31 DIAGNOSIS — I110 Hypertensive heart disease with heart failure: Secondary | ICD-10-CM | POA: Diagnosis not present

## 2022-05-31 DIAGNOSIS — I350 Nonrheumatic aortic (valve) stenosis: Secondary | ICD-10-CM | POA: Diagnosis not present

## 2022-05-31 DIAGNOSIS — I2510 Atherosclerotic heart disease of native coronary artery without angina pectoris: Secondary | ICD-10-CM | POA: Diagnosis not present

## 2022-05-31 DIAGNOSIS — J849 Interstitial pulmonary disease, unspecified: Secondary | ICD-10-CM | POA: Diagnosis not present

## 2022-05-31 NOTE — Progress Notes (Unsigned)
Cardiology Office Note:    Date:  06/02/2022   ID:  [name], DOB 1935/10/10, MRN [id number]  PCP:  Natalie Mussel, MD  Cardiologist:  None  Electrophysiologist:  None   Referring MD: Natalie Killian, MD   Chief Complaint  Patient presents with   Aortic Stenosis    History of Present Illness:    [name] is a 87 y.o. female with a hx of asthma, breast cancer, hypertension, hyperlipidemia, obesity who presents for follow-up.  She was referred by Dr. Dareen [name] for evaluation of aortic stenosis, initially seen 09/02/2021.  She denies any chest pain, lightheadedness, syncope, or palpitations.  Does report she gets short of breath with minimal exertion.  Does improve with inhaler use.  Reports swelling in her feet, uses compression stockings.  Smoked in her 60s.  Family history includes both her daughters had strokes.  Echocardiogram 08/03/2021 showed EF 65 to 70%, normal RV function, moderate aortic stenosis.    She was admitted 05/2022 with acute on chronic diastolic heart failure.  She was diuresed with IV Lasix and discharged on p.o. Lasix 40 mg daily.  Also treated for possible ILD flare with prednisone.  Echocardiogram 05/07/2022 showed EF 60 to 65%, normal RV function, moderate aortic stenosis.  Since last clinic visit, she reports she is doing okay.  States has had she feels significant improved since recent hospitalization.  Continues to take Lasix, feels like it is keeping the fluid off.  She reports occasional chest pain that she describes as burning after eating.  Continues to have shortness of breath.  Denies any lightheadedness, syncope, or palpitations.   Wt Readings from Last 3 Encounters:  06/02/22 232 lb (105.2 kg)  05/19/22 232 lb 3.2 oz (105.3 kg)  05/10/22 237 lb 3.4 oz (107.6 kg)     Past Medical History:  Diagnosis Date   Allergic rhinitis, cause unspecified    Asthma    Cancer ([place])    CHF exacerbation ([place]) 05/08/2022   Chronic back pain    Coronary artery  calcification seen on CAT scan 04/21/2017   Enlarged pulmonary artery ([place]) 04/21/2017   By chest CT   Frequent urination 11/14/2017   GERD (gastroesophageal reflux disease) 05/19/2011   history of Bilateral leg edema-likely amlodipine induced. 07/28/2011   history of CAP (community acquired pneumonia) 07/14/2011   history of HYPOTHYROIDISM, BORDERLINE 03/20/2006   Annotation: Asymptomatic, untreated Qualifier: Diagnosis of  By: Tomasa Hosteller MD, Edmon Crape.    Hx of breast cancer    Hyperlipidemia    Hypertension    Hypothyroidism    Lumbago    Murmur, cardiac 11/22/2016   OBESITY NOS 03/20/2006   Qualifier: Diagnosis of  By: Tomasa Hosteller MD, Veronique D.    OSTEOARTHRITIS 12/13/2005   Annotation: bilateral knees;right knee injection 6/05 Qualifier: Diagnosis of  By: Tomasa Hosteller MD, Edmon Crape.    Pain, dental 09/18/2020   Preventative health care 09/04/2014   Pulmonary nodule 05/30/2017   4 mm right middle lobe nodule noted on high-resolution CT done on February 06, 2017   right shoulder pain, likely Impingement syndrome  09/09/2010   Upper respiratory infection 04/09/2018   VENOUS INSUFFICIENCY, CHRONIC 03/20/2006   Qualifier: Diagnosis of  By: Tomasa Hosteller MD, Edmon Crape.     Past Surgical History:  Procedure Laterality Date   ABDOMINAL HYSTERECTOMY     BREAST LUMPECTOMY     COLONOSCOPY     HERNIA REPAIR     HIP CLOSED REDUCTION Right 11/26/2014   Procedure: CLOSED  REDUCTIION RIGHT HIP;  Surgeon: Newt Minion, MD;  Location: [facility];  Service: Orthopedics;  Laterality: Right;   INCISIONAL HERNIA REPAIR N/A 10/29/2013   Procedure: OPEN REPAIR OF RECURRENT INCISIONAL HERNIA WITH MESH;  Surgeon: Zenovia Jarred, MD;  Location: [facility];  Service: General;  Laterality: N/A;   JOINT REPLACEMENT     TOTAL HIP ARTHROPLASTY      Current Medications: Current Meds  Medication Sig   acetaminophen-codeine (TYLENOL #3) 300-30 MG tablet TAKE 1 TABLET EVERY 4 HOURS AS NEEDED FOR MODERATE PAIN.   albuterol (VENTOLIN  HFA) 108 (90 Base) MCG/ACT inhaler INHALE 2 PUFFS INTO THE LUNGS EVERY 6 HOURS AS NEEDED FOR WHEEZING OR SHORTNESS OF BREATH.   amLODipine (NORVASC) 5 MG tablet Take 1 tablet (5 mg total) by mouth daily.   aspirin EC 81 MG tablet Take 81 mg by mouth daily.   atorvastatin (LIPITOR) 40 MG tablet TAKE 1 TABLET (40 MG TOTAL) BY MOUTH DAILY.   BREO ELLIPTA 200-25 MCG/ACT AEPB INHALE 1 PUFF INTO THE LUNGS DAILY   brimonidine (ALPHAGAN) 0.2 % ophthalmic solution Place 1 drop into the left eye daily.   carvedilol (COREG) 25 MG tablet TAKE 1 TABLET BY MOUTH TWICE DAILY WITH A MEAL (Patient taking differently: Take 25 mg by mouth 2 (two) times daily with a meal.)   Cyanocobalamin (VITAMIN B12 PO) Take 1 tablet daily by mouth.   diclofenac Sodium (VOLTAREN) 1 % GEL APPLY 2 GRAMS TOPICALLY 4 TIMES DAILY (Patient taking differently: Apply 2 g topically 4 (four) times daily as needed (Pain).)   dorzolamide-timolol (COSOPT) 22.3-6.8 MG/ML ophthalmic solution Place 1 drop into the left eye daily.   furosemide (LASIX) 40 MG tablet Take 1 tablet (40 mg total) by mouth daily.   latanoprost (XALATAN) 0.005 % ophthalmic solution Place 1 drop into the left eye daily.   levothyroxine (SYNTHROID) 25 MCG tablet TAKE 1/2 TABLET EVERY DAY BEFORE BREAKFAST (Patient taking differently: Take 12.5 mcg by mouth daily before breakfast.)   losartan (COZAAR) 100 MG tablet TAKE 1 TABLET (100 MG TOTAL) BY MOUTH DAILY.   pantoprazole (PROTONIX) 40 MG tablet TAKE 1 TABLET (40 MG TOTAL) BY MOUTH DAILY.   predniSONE (DELTASONE) 10 MG tablet Take 1 tablet (10 mg total) by mouth daily.     Allergies:   Acyclovir and related, Metoprolol, and Food   Social History   Socioeconomic History   Marital status: Legally Separated    Spouse name: Not on file   Number of children: Not on file   Years of education: Not on file   Highest education level: Not on file  Occupational History   Occupation: not working  Tobacco Use   Smoking  status: Former    Packs/day: 0.25    Years: 0.50    Additional pack years: 0.00    Total pack years: 0.13    Types: Cigarettes    Quit date: 09/09/1958    Years since quitting: 63.7   Smokeless tobacco: Never   Tobacco comments:    social smoker  Vaping Use   Vaping Use: Never used  Substance and Sexual Activity   Alcohol use: No    Alcohol/week: 0.0 standard drinks of alcohol   Drug use: No   Sexual activity: Not Currently  Other Topics Concern   Not on file  Social History Narrative   Lives with daughter.    Social Determinants of Health   Financial Resource Strain: Low Risk  (03/31/2018)   Overall  Financial Resource Strain (CARDIA)    Difficulty of Paying Living Expenses: Not very hard  Food Insecurity: No Food Insecurity (05/08/2022)   Hunger Vital Sign    Worried About Running Out of Food in the Last Year: Never true    Ran Out of Food in the Last Year: Never true  Transportation Needs: No Transportation Needs (05/08/2022)   PRAPARE - Hydrologist (Medical): No    Lack of Transportation (Non-Medical): No  Physical Activity: Unknown (03/31/2018)   Exercise Vital Sign    Days of Exercise per Week: Patient declined    Minutes of Exercise per Session: Patient declined  Stress: Stress Concern Present (03/31/2018)   [place]    Feeling of Stress : To some extent  Social Connections: Unknown (03/31/2018)   Social Connection and Isolation Panel [NHANES]    Frequency of Communication with Friends and Family: Patient declined    Frequency of Social Gatherings with Friends and Family: Patient declined    Attends Religious Services: Patient declined    Marine scientist or Organizations: Patient declined    Attends Archivist Meetings: Patient declined    Marital Status: Patient declined     Family History: The patient's family history includes Alzheimer's disease in her  mother; Diabetes in her sister; Hypertension in her mother. There is no history of Colon cancer, Colon polyps, Esophageal cancer, Stomach cancer, or Rectal cancer.  ROS:   Please see the history of present illness.     All other systems reviewed and are negative.  EKGs/Labs/Other Studies Reviewed:    The following studies were reviewed today:   EKG:    Recent Labs: 04/27/2022: TSH 4.290 05/07/2022: B Natriuretic Peptide 189.0 05/08/2022: Hemoglobin 11.0; Magnesium 1.7; Platelets 197 05/19/2022: BUN 13; Creatinine, Ser 0.97; Potassium 3.7; Sodium 140  Recent Lipid Panel    Component Value Date/Time   CHOL 119 05/19/2022 1136   TRIG 69 05/19/2022 1136   HDL 60 05/19/2022 1136   CHOLHDL 2.0 05/19/2022 1136   CHOLHDL 2.5 09/04/2014 1037   VLDL 12 09/04/2014 1037   LDLCALC 45 05/19/2022 1136    Physical Exam:    VS:  BP 138/85 (BP Location: Right Arm, Patient Position: Sitting, Cuff Size: Large)   Pulse 74   Ht 5\' 2"  (1.575 m)   Wt 232 lb (105.2 kg)   SpO2 97%   BMI 42.43 kg/m     Wt Readings from Last 3 Encounters:  06/02/22 232 lb (105.2 kg)  05/19/22 232 lb 3.2 oz (105.3 kg)  05/10/22 237 lb 3.4 oz (107.6 kg)     GEN:  Well nourished, well developed in no acute distress HEENT: Normal NECK: No JVD; No carotid bruits LYMPHATICS: No lymphadenopathy CARDIAC: RRR, 3 out of 6 systolic murmur RESPIRATORY:  Clear to auscultation without rales, wheezing or rhonchi  ABDOMEN: Soft, non-tender, non-distended MUSCULOSKELETAL: Trace lower extremity edema; No deformity  SKIN: Warm and dry NEUROLOGIC:  Alert and oriented x 3 PSYCHIATRIC:  Normal affect   ASSESSMENT:    1. Shortness of breath   2. Chronic diastolic heart failure ([place])   3. Aortic valve stenosis, etiology of cardiac valve disease unspecified   4. Essential hypertension   5. Hyperlipidemia, unspecified hyperlipidemia type     PLAN:    Dyspnea: Reporting dyspnea with minimal exertion.  Likely multifactorial  with asthma, HFpEF, and deconditioning contributing.  However does have significant CAD risk  factors (age, hypertension, hyperlipidemia).  Recommend stress PET to rule out ischemia  Aortic stenosis: Echocardiogram 08/03/2021 showed EF 65 to 70%, normal RV function, moderate aortic stenosis.  Echocardiogram 05/07/2022 showed EF 60 to 65%, normal RV function, moderate aortic stenosis. -Plan repeat echocardiogram in 1 year to follow  Chronic diastolic heart failure: She was admitted 05/2022 with acute on chronic diastolic heart failure.  She was diuresed with IV Lasix and discharged on p.o. Lasix 40 mg daily. -Continue Lasix 40 mg daily.  Appears euvolemic.  Check BMET/magnesium  Hypertension: On amlodipine 5 mg daily and Coreg 25 mg twice daily and losartan 100 mg daily.  Appears controlled  Hyperlipidemia: On atorvastatin 40 mg daily.  LDL 45 on 05/19/22  RTC in 3 months   Shared Decision Making/Informed Consent The risks [chest pain, shortness of breath, cardiac arrhythmias, dizziness, blood pressure fluctuations, myocardial infarction, stroke/transient ischemic attack, nausea, vomiting, allergic reaction, radiation exposure, metallic taste sensation and life-threatening complications (estimated to be 1 in 10,000)], benefits (risk stratification, diagnosing coronary artery disease, treatment guidance) and alternatives of a cardiac PET stress test were discussed in detail with [name] and she agrees to proceed.    Medication Adjustments/Labs and Tests Ordered: Current medicines are reviewed at length with the patient today.  Concerns regarding medicines are outlined above.  Orders Placed This Encounter  Procedures   NM PET CT CARDIAC PERFUSION MULTI W/ABSOLUTE BLOODFLOW   Basic metabolic panel   Magnesium   No orders of the defined types were placed in this encounter.   Patient Instructions  Medication Instructions:  Your physician recommends that you continue on your current medications  as directed. Please refer to the Current Medication list given to you today.  *If you need a refill on your cardiac medications before your next appointment, please call your pharmacy*   Lab Work: BMET, [place] today  If you have labs (blood work) drawn today and your tests are completely normal, you will receive your results only by: [place] (if you have MyChart) OR A paper copy in the mail If you have any lab test that is abnormal or we need to change your treatment, we will call you to review the results.   Testing/Procedures: CARDIAC PET- Your physician has requested that you have a Cardiac Pet Stress Test. This testing is completed at [facility] ([address], [place] [address]). The schedulers will call you to get this scheduled. Please follow instructions below and call the office with any questions/concerns [phone]).  Follow-Up: At [facility], you and your health needs are our priority.  As part of our continuing mission to provide you with exceptional heart care, we have created designated Provider Care Teams.  These Care Teams include your primary Cardiologist (physician) and Advanced Practice Providers (APPs -  Physician Assistants and Nurse Practitioners) who all work together to provide you with the care you need, when you need it.  We recommend signing up for the patient portal called "MyChart".  Sign up information is provided on this After Visit Summary.  MyChart is used to connect with patients for Virtual Visits (Telemedicine).  Patients are able to view lab/test results, encounter notes, upcoming appointments, etc.  Non-urgent messages can be sent to your provider as well.   To learn more about what you can do with MyChart, go to [email or web address].    Your next appointment:   3 month(s)  Provider:   Dr. Gardiner Rhyme  Other Instructions How to  Prepare for Your Cardiac PET/CT Stress Test:  1. Please do not take  these medications before your test:   Medications that may interfere with the cardiac pharmacological stress agent (ex. nitrates - including erectile dysfunction medications, isosorbide mononitrate, tamulosin or beta-blockers) the day of the exam. (Erectile dysfunction medication should be held for at least 72 hrs prior to test) Theophylline containing medications for 12 hours. Dipyridamole 48 hours prior to the test. Your remaining medications may be taken with water.  2. Nothing to eat or drink, except water, 3 hours prior to arrival time.   NO caffeine/decaffeinated products, or chocolate 12 hours prior to arrival.  3. NO perfume, cologne or lotion  4. Total time is 1 to 2 hours; you may want to bring reading material for the waiting time.  5. Please report to Radiology at the [facility] Main Entrance 30 minutes early for your test.  [place], [place] [address]  Diabetic Preparation:  Hold oral medications. You may take NPH and Lantus insulin. Do not take Humalog or Humulin R (Regular Insulin) the day of your test. Check blood sugars prior to leaving the house. If able to eat breakfast prior to 3 hour fasting, you may take all medications, including your insulin, Do not worry if you miss your breakfast dose of insulin - start at your next meal.  IF YOU THINK YOU MAY BE PREGNANT, OR ARE NURSING PLEASE INFORM THE TECHNOLOGIST.  In preparation for your appointment, medication and supplies will be purchased.  Appointment availability is limited, so if you need to cancel or reschedule, please call the Radiology Department at [phone]  24 hours in advance to avoid a cancellation fee of $100.00  What to Expect After you Arrive:  Once you arrive and check in for your appointment, you will be taken to a preparation room within the Radiology Department.  A technologist or Nurse will obtain your medical history, verify that you are correctly prepped for the  exam, and explain the procedure.  Afterwards,  an IV will be started in your arm and electrodes will be placed on your skin for EKG monitoring during the stress portion of the exam. Then you will be escorted to the PET/CT scanner.  There, staff will get you positioned on the scanner and obtain a blood pressure and EKG.  During the exam, you will continue to be connected to the EKG and blood pressure machines.  A small, safe amount of a radioactive tracer will be injected in your IV to obtain a series of pictures of your heart along with an injection of a stress agent.    After your Exam:  It is recommended that you eat a meal and drink a caffeinated beverage to counter act any effects of the stress agent.  Drink plenty of fluids for the remainder of the day and urinate frequently for the first couple of hours after the exam.  Your doctor will inform you of your test results within 7-10 business days.  For questions about your test or how to prepare for your test, please call: Marchia Bond, Cardiac Imaging Nurse Navigator  Gordy Clement, Cardiac Imaging Nurse Navigator Office: [phone]     Signed, Donato Heinz, MD  06/02/2022 11:09 AM    [place]

## 2022-06-02 ENCOUNTER — Encounter (HOSPITAL_BASED_OUTPATIENT_CLINIC_OR_DEPARTMENT_OTHER): Payer: Self-pay | Admitting: Cardiology

## 2022-06-02 ENCOUNTER — Ambulatory Visit (HOSPITAL_BASED_OUTPATIENT_CLINIC_OR_DEPARTMENT_OTHER): Payer: Medicare HMO | Admitting: Cardiology

## 2022-06-02 VITALS — BP 138/85 | HR 74 | Ht 62.0 in | Wt 232.0 lb

## 2022-06-02 DIAGNOSIS — I10 Essential (primary) hypertension: Secondary | ICD-10-CM

## 2022-06-02 DIAGNOSIS — E785 Hyperlipidemia, unspecified: Secondary | ICD-10-CM | POA: Diagnosis not present

## 2022-06-02 DIAGNOSIS — R0602 Shortness of breath: Secondary | ICD-10-CM

## 2022-06-02 DIAGNOSIS — I350 Nonrheumatic aortic (valve) stenosis: Secondary | ICD-10-CM

## 2022-06-02 DIAGNOSIS — I5032 Chronic diastolic (congestive) heart failure: Secondary | ICD-10-CM | POA: Diagnosis not present

## 2022-06-02 NOTE — Patient Instructions (Signed)
Medication Instructions:  Your physician recommends that you continue on your current medications as directed. Please refer to the Current Medication list given to you today.  *If you need a refill on your cardiac medications before your next appointment, please call your pharmacy*   Lab Work: BMET, [place] today  If you have labs (blood work) drawn today and your tests are completely normal, you will receive your results only by: [place] (if you have MyChart) OR A paper copy in the mail If you have any lab test that is abnormal or we need to change your treatment, we will call you to review the results.   Testing/Procedures: CARDIAC PET- Your physician has requested that you have a Cardiac Pet Stress Test. This testing is completed at [facility] ([place], [place] [address]). The schedulers will call you to get this scheduled. Please follow instructions below and call the office with any questions/concerns [phone]).  Follow-Up: At [facility], you and your health needs are our priority.  As part of our continuing mission to provide you with exceptional heart care, we have created designated Provider Care Teams.  These Care Teams include your primary Cardiologist (physician) and Advanced Practice Providers (APPs -  Physician Assistants and Nurse Practitioners) who all work together to provide you with the care you need, when you need it.  We recommend signing up for the patient portal called "MyChart".  Sign up information is provided on this After Visit Summary.  MyChart is used to connect with patients for Virtual Visits (Telemedicine).  Patients are able to view lab/test results, encounter notes, upcoming appointments, etc.  Non-urgent messages can be sent to your provider as well.   To learn more about what you can do with MyChart, go to [email or web address].    Your next appointment:   3 month(s)  Provider:   Dr.  Gardiner Rhyme  Other Instructions How to Prepare for Your Cardiac PET/CT Stress Test:  1. Please do not take these medications before your test:   Medications that may interfere with the cardiac pharmacological stress agent (ex. nitrates - including erectile dysfunction medications, isosorbide mononitrate, tamulosin or beta-blockers) the day of the exam. (Erectile dysfunction medication should be held for at least 72 hrs prior to test) Theophylline containing medications for 12 hours. Dipyridamole 48 hours prior to the test. Your remaining medications may be taken with water.  2. Nothing to eat or drink, except water, 3 hours prior to arrival time.   NO caffeine/decaffeinated products, or chocolate 12 hours prior to arrival.  3. NO perfume, cologne or lotion  4. Total time is 1 to 2 hours; you may want to bring reading material for the waiting time.  5. Please report to Radiology at the [facility] Main Entrance 30 minutes early for your test.  [place], [place] [address]  Diabetic Preparation:  Hold oral medications. You may take NPH and Lantus insulin. Do not take Humalog or Humulin R (Regular Insulin) the day of your test. Check blood sugars prior to leaving the house. If able to eat breakfast prior to 3 hour fasting, you may take all medications, including your insulin, Do not worry if you miss your breakfast dose of insulin - start at your next meal.  IF YOU THINK YOU MAY BE PREGNANT, OR ARE NURSING PLEASE INFORM THE TECHNOLOGIST.  In preparation for your appointment, medication and supplies will be purchased.  Appointment availability is limited, so if  you need to cancel or reschedule, please call the Radiology Department at [phone]  24 hours in advance to avoid a cancellation fee of $100.00  What to Expect After you Arrive:  Once you arrive and check in for your appointment, you will be taken to a preparation room within the Radiology Department.   A technologist or Nurse will obtain your medical history, verify that you are correctly prepped for the exam, and explain the procedure.  Afterwards,  an IV will be started in your arm and electrodes will be placed on your skin for EKG monitoring during the stress portion of the exam. Then you will be escorted to the PET/CT scanner.  There, staff will get you positioned on the scanner and obtain a blood pressure and EKG.  During the exam, you will continue to be connected to the EKG and blood pressure machines.  A small, safe amount of a radioactive tracer will be injected in your IV to obtain a series of pictures of your heart along with an injection of a stress agent.    After your Exam:  It is recommended that you eat a meal and drink a caffeinated beverage to counter act any effects of the stress agent.  Drink plenty of fluids for the remainder of the day and urinate frequently for the first couple of hours after the exam.  Your doctor will inform you of your test results within 7-10 business days.  For questions about your test or how to prepare for your test, please call: Marchia Bond, Cardiac Imaging Nurse Navigator  Gordy Clement, Cardiac Imaging Nurse Navigator Office: [phone]

## 2022-06-03 ENCOUNTER — Other Ambulatory Visit: Payer: Self-pay

## 2022-06-03 DIAGNOSIS — Z79899 Other long term (current) drug therapy: Secondary | ICD-10-CM

## 2022-06-03 LAB — BASIC METABOLIC PANEL
BUN/Creatinine Ratio: 11 — ABNORMAL LOW (ref 12–28)
BUN: 9 mg/dL (ref 8–27)
CO2: 27 mmol/L (ref 20–29)
Calcium: 9.3 mg/dL (ref 8.7–10.3)
Chloride: 100 mmol/L (ref 96–106)
Creatinine, Ser: 0.85 mg/dL (ref 0.57–1.00)
Glucose: 122 mg/dL — ABNORMAL HIGH (ref 70–99)
Potassium: 3.4 mmol/L — ABNORMAL LOW (ref 3.5–5.2)
Sodium: 141 mmol/L (ref 134–144)
eGFR: 68 mL/min/{1.73_m2} (ref >59)

## 2022-06-03 LAB — MAGNESIUM: Magnesium: 1.7 mg/dL (ref 1.6–2.3)

## 2022-06-03 MED ORDER — POTASSIUM CHLORIDE CRYS ER 20 MEQ PO TBCR: 20.0000 meq | EXTENDED_RELEASE_TABLET | Freq: Every day | ORAL | 1 refills | Status: DC

## 2022-06-03 MED ORDER — MAGNESIUM OXIDE 400 MG PO CAPS: 400.0000 mg | ORAL_CAPSULE | Freq: Every day | ORAL | 1 refills | Status: DC

## 2022-06-06 DIAGNOSIS — J9621 Acute and chronic respiratory failure with hypoxia: Secondary | ICD-10-CM | POA: Diagnosis not present

## 2022-06-06 DIAGNOSIS — I110 Hypertensive heart disease with heart failure: Secondary | ICD-10-CM | POA: Diagnosis not present

## 2022-06-06 DIAGNOSIS — J849 Interstitial pulmonary disease, unspecified: Secondary | ICD-10-CM | POA: Diagnosis not present

## 2022-06-06 DIAGNOSIS — I272 Pulmonary hypertension, unspecified: Secondary | ICD-10-CM | POA: Diagnosis not present

## 2022-06-06 DIAGNOSIS — I2510 Atherosclerotic heart disease of native coronary artery without angina pectoris: Secondary | ICD-10-CM | POA: Diagnosis not present

## 2022-06-06 DIAGNOSIS — J45909 Unspecified asthma, uncomplicated: Secondary | ICD-10-CM | POA: Diagnosis not present

## 2022-06-06 DIAGNOSIS — I350 Nonrheumatic aortic (valve) stenosis: Secondary | ICD-10-CM | POA: Diagnosis not present

## 2022-06-06 DIAGNOSIS — I5031 Acute diastolic (congestive) heart failure: Secondary | ICD-10-CM | POA: Diagnosis not present

## 2022-06-06 DIAGNOSIS — G4733 Obstructive sleep apnea (adult) (pediatric): Secondary | ICD-10-CM | POA: Diagnosis not present

## 2022-06-07 ENCOUNTER — Other Ambulatory Visit: Payer: Self-pay | Admitting: Cardiology

## 2022-06-07 DIAGNOSIS — Z79899 Other long term (current) drug therapy: Secondary | ICD-10-CM | POA: Diagnosis not present

## 2022-06-08 DIAGNOSIS — J45909 Unspecified asthma, uncomplicated: Secondary | ICD-10-CM | POA: Diagnosis not present

## 2022-06-08 DIAGNOSIS — I5031 Acute diastolic (congestive) heart failure: Secondary | ICD-10-CM | POA: Diagnosis not present

## 2022-06-08 DIAGNOSIS — I350 Nonrheumatic aortic (valve) stenosis: Secondary | ICD-10-CM | POA: Diagnosis not present

## 2022-06-08 DIAGNOSIS — I2510 Atherosclerotic heart disease of native coronary artery without angina pectoris: Secondary | ICD-10-CM | POA: Diagnosis not present

## 2022-06-08 DIAGNOSIS — J9621 Acute and chronic respiratory failure with hypoxia: Secondary | ICD-10-CM | POA: Diagnosis not present

## 2022-06-08 DIAGNOSIS — I110 Hypertensive heart disease with heart failure: Secondary | ICD-10-CM | POA: Diagnosis not present

## 2022-06-08 DIAGNOSIS — J849 Interstitial pulmonary disease, unspecified: Secondary | ICD-10-CM | POA: Diagnosis not present

## 2022-06-08 DIAGNOSIS — I272 Pulmonary hypertension, unspecified: Secondary | ICD-10-CM | POA: Diagnosis not present

## 2022-06-08 DIAGNOSIS — G4733 Obstructive sleep apnea (adult) (pediatric): Secondary | ICD-10-CM | POA: Diagnosis not present

## 2022-06-08 LAB — BASIC METABOLIC PANEL
BUN/Creatinine Ratio: 11 — ABNORMAL LOW (ref 12–28)
BUN: 9 mg/dL (ref 8–27)
CO2: 25 mmol/L (ref 20–29)
Calcium: 8.9 mg/dL (ref 8.7–10.3)
Chloride: 103 mmol/L (ref 96–106)
Creatinine, Ser: 0.84 mg/dL (ref 0.57–1.00)
Glucose: 119 mg/dL — ABNORMAL HIGH (ref 70–99)
Potassium: 4.4 mmol/L (ref 3.5–5.2)
Sodium: 143 mmol/L (ref 134–144)
eGFR: 68 mL/min/{1.73_m2} (ref >59)

## 2022-06-08 LAB — MAGNESIUM: Magnesium: 2 mg/dL (ref 1.6–2.3)

## 2022-06-11 DIAGNOSIS — I5031 Acute diastolic (congestive) heart failure: Secondary | ICD-10-CM | POA: Diagnosis not present

## 2022-06-11 DIAGNOSIS — J9621 Acute and chronic respiratory failure with hypoxia: Secondary | ICD-10-CM | POA: Diagnosis not present

## 2022-06-11 DIAGNOSIS — I272 Pulmonary hypertension, unspecified: Secondary | ICD-10-CM | POA: Diagnosis not present

## 2022-06-11 DIAGNOSIS — J45909 Unspecified asthma, uncomplicated: Secondary | ICD-10-CM | POA: Diagnosis not present

## 2022-06-11 DIAGNOSIS — G4733 Obstructive sleep apnea (adult) (pediatric): Secondary | ICD-10-CM | POA: Diagnosis not present

## 2022-06-11 DIAGNOSIS — I2510 Atherosclerotic heart disease of native coronary artery without angina pectoris: Secondary | ICD-10-CM | POA: Diagnosis not present

## 2022-06-11 DIAGNOSIS — J849 Interstitial pulmonary disease, unspecified: Secondary | ICD-10-CM | POA: Diagnosis not present

## 2022-06-11 DIAGNOSIS — I350 Nonrheumatic aortic (valve) stenosis: Secondary | ICD-10-CM | POA: Diagnosis not present

## 2022-06-11 DIAGNOSIS — I110 Hypertensive heart disease with heart failure: Secondary | ICD-10-CM | POA: Diagnosis not present

## 2022-06-13 ENCOUNTER — Telehealth: Payer: Self-pay | Admitting: *Deleted

## 2022-06-13 DIAGNOSIS — I5031 Acute diastolic (congestive) heart failure: Secondary | ICD-10-CM | POA: Diagnosis not present

## 2022-06-13 DIAGNOSIS — I350 Nonrheumatic aortic (valve) stenosis: Secondary | ICD-10-CM | POA: Diagnosis not present

## 2022-06-13 DIAGNOSIS — I272 Pulmonary hypertension, unspecified: Secondary | ICD-10-CM | POA: Diagnosis not present

## 2022-06-13 DIAGNOSIS — I2510 Atherosclerotic heart disease of native coronary artery without angina pectoris: Secondary | ICD-10-CM | POA: Diagnosis not present

## 2022-06-13 DIAGNOSIS — J849 Interstitial pulmonary disease, unspecified: Secondary | ICD-10-CM | POA: Diagnosis not present

## 2022-06-13 DIAGNOSIS — G4733 Obstructive sleep apnea (adult) (pediatric): Secondary | ICD-10-CM | POA: Diagnosis not present

## 2022-06-13 DIAGNOSIS — I110 Hypertensive heart disease with heart failure: Secondary | ICD-10-CM | POA: Diagnosis not present

## 2022-06-13 DIAGNOSIS — J9621 Acute and chronic respiratory failure with hypoxia: Secondary | ICD-10-CM | POA: Diagnosis not present

## 2022-06-13 DIAGNOSIS — J45909 Unspecified asthma, uncomplicated: Secondary | ICD-10-CM | POA: Diagnosis not present

## 2022-06-13 NOTE — Telephone Encounter (Signed)
Called / informed pt she should had completed Prednisone last month and it is no longer needed per her PCP. And to bring her meds to her appt on Wed; pt thought her appt was next week. Informed pt her appt is Wed 4/10 @ 0945 AM.

## 2022-06-13 NOTE — Telephone Encounter (Signed)
Call from pt who states the [facility] nurse states Prednisone is on her AVS med list but pt does not have it in her home. Pt wants to know if she suppose to be on this med or not. Per chart, rx was last written on 05/11/22 when discharged from the hospital Thanks.

## 2022-06-14 ENCOUNTER — Encounter: Payer: Self-pay | Admitting: Internal Medicine

## 2022-06-14 NOTE — Progress Notes (Unsigned)
[facility]: Clinic Note  Subjective:  History of Present Illness: [name] is a 87 y.o. year old female who presents for routine follow up. Saw resident for hospital follow up after HFpEF exacerbation. I saw her on 04/27/22.  She is doing great!  She has her oxygen tank now, so she's able to use the oxygen on the go. She doesn't drive, so needs to ask her son for transportation, so still hasn't been to church.   No chest pain or dyspnea on exertion. No leg swelling.   Please refer to Assessment and Plan below for full details in Problem-Based Charting.   Past Medical History:  Patient Active Problem List   Diagnosis Date Noted   (HFpEF) heart failure with preserved ejection fraction 06/15/2022   B12 deficiency 06/15/2022   Nocturnal polyuria 04/27/2022   ILD (interstitial lung disease) 01/03/2022   Right shoulder pain 08/24/2021   Moderate aortic stenosis 08/24/2021   Chronic respiratory failure with hypoxia 12/16/2020   OSA (obstructive sleep apnea) 12/16/2020   Pruritus 10/21/2020   Elevated hemidiaphragm 03/10/2020   Healthcare maintenance 03/10/2020   Nocturnal hypoxia 03/10/2020   Anemia 04/09/2018   Coronary artery calcification seen on CAT scan 04/21/2017   Enlarged pulmonary artery 04/21/2017   Restrictive lung disease 11/22/2016   Borderline diabetes 09/19/2013   GERD (gastroesophageal reflux disease) 05/19/2011   Hyperlipidemia 05/02/2007   Severe obesity (BMI >= 40) 03/20/2006   Chronic venous insufficiency 03/20/2006   Right-sided low back pain with right-sided sciatica 03/20/2006   BREAST CANCER, HX OF 03/20/2006   Essential hypertension 12/13/2005   Osteoarthritis 12/13/2005     Medications:  Current Outpatient Medications:    acetaminophen-codeine (TYLENOL #3) 300-30 MG tablet, TAKE 1 TABLET EVERY 4 HOURS AS NEEDED FOR MODERATE PAIN., Disp: 30 tablet, Rfl: 0   albuterol (VENTOLIN HFA) 108 (90 Base) MCG/ACT inhaler, INHALE 2  PUFFS INTO THE LUNGS EVERY 6 HOURS AS NEEDED FOR WHEEZING OR SHORTNESS OF BREATH., Disp: 1 each, Rfl: 3   amLODipine (NORVASC) 10 MG tablet, Take 1 tablet (10 mg total) by mouth daily., Disp: 90 tablet, Rfl: 3   aspirin EC 81 MG tablet, Take 81 mg by mouth daily., Disp: , Rfl:    atorvastatin (LIPITOR) 40 MG tablet, Take 1 tablet (40 mg total) by mouth daily., Disp: 90 tablet, Rfl: 3   BREO ELLIPTA 200-25 MCG/ACT AEPB, INHALE 1 PUFF INTO THE LUNGS DAILY, Disp: 180 each, Rfl: 3   brimonidine (ALPHAGAN) 0.2 % ophthalmic solution, Place 1 drop into the left eye daily., Disp: , Rfl:    carvedilol (COREG) 25 MG tablet, TAKE 1 TABLET BY MOUTH TWICE DAILY WITH A MEAL (Patient taking differently: Take 25 mg by mouth 2 (two) times daily with a meal.), Disp: 180 tablet, Rfl: 3   Cyanocobalamin (VITAMIN B12 PO), Take 1 tablet daily by mouth., Disp: , Rfl:    diclofenac Sodium (VOLTAREN) 1 % GEL, APPLY 2 GRAMS TOPICALLY 4 TIMES DAILY (Patient taking differently: Apply 2 g topically 4 (four) times daily as needed (Pain).), Disp: 700 g, Rfl: 10   dorzolamide-timolol (COSOPT) 22.3-6.8 MG/ML ophthalmic solution, Place 1 drop into the left eye daily., Disp: , Rfl:    latanoprost (XALATAN) 0.005 % ophthalmic solution, Place 1 drop into the left eye daily., Disp: , Rfl:    losartan (COZAAR) 100 MG tablet, TAKE 1 TABLET (100 MG TOTAL) BY MOUTH DAILY., Disp: 90 tablet, Rfl: 3   Allergies: Allergies  Allergen Reactions  Acyclovir And Related    Metoprolol Itching   Food Rash and Other (See Comments)    Pt states that she is allergic to pickles.       Objective:   Vitals: Vitals:   06/15/22 0958  BP: 129/72  Pulse: 75  Temp: 97.6 F (36.4 C)  SpO2: 100%     Physical Exam: Physical Exam Constitutional:      Appearance: Normal appearance. She is obese.  Cardiovascular:     Rate and Rhythm: Normal rate and regular rhythm.     Heart sounds: Murmur heard.  Pulmonary:     Effort: Pulmonary effort is  normal. No respiratory distress.     Breath sounds: Normal breath sounds. No wheezing.  Musculoskeletal:     Right lower leg: No edema.     Left lower leg: No edema.  Skin:    General: Skin is warm and dry.  Neurological:     Mental Status: She is alert.      Data: Labs, imaging, and micro were reviewed in Epic. Refer to Assessment and Plan below for full details in Problem-Based Charting.  Assessment & Plan:  Chronic respiratory failure with hypoxia (HCC) - chronic and stable - I think this is multifactorial, 2/2 obesity/restrictive lung disease and mild ILD. PFTs are not consistent with COPD or asthma. She is currently euvolemic, so I don't think her chronic HFpEF is contributing to her chronic hypoxia - She was satting 95% on room air at rest today, so she does not need oxygen at rest - Continue 1-2L O2 with ambulation and overnight. She has oxygen tank with her - She will call Pulm to schedule an appointment post hospitalization.  - She gets annual high res CT for early ILD, next ordered for 12/2022 - Continue Breo & Albuterol prn   Essential hypertension - She brought in her medicines today, and is taking Amlodipine 10mg  daily, Carvedilol 25mg  BID, and Losartan 100mg  daily. Continue these medicines.  - Could try Caduet combo pill in the future to decrease pill burden  Hyperlipidemia - Continue Lipitor 40mg  daily - Chronic and stable problem - Could consider Caduet   GERD (gastroesophageal reflux disease) - Stop PPI, it is not needed  (HFpEF) heart failure with preserved ejection fraction - Weight is stable off of Lasix (she had not been taking). I will remove this from her med list - Stop potassium & K supplements, check BMP today - Could consider SGLT2, but I think the hassle of taking another medicine may outweigh benefits in this 86yo woman who would prefer to cut back on # of medicines she takes  Right-sided low back pain with right-sided sciatica - Refill tylenol  3 today  B12 deficiency - Patient had been taking Vitamin B12 supplements, but I'm not sure that she needs them. Will check vitamin b12 today, and stop supplements if this is normal       Patient will follow up in 4-5 months.   Mercie Eon, MD

## 2022-06-15 ENCOUNTER — Ambulatory Visit (INDEPENDENT_AMBULATORY_CARE_PROVIDER_SITE_OTHER): Payer: Medicare HMO | Admitting: Internal Medicine

## 2022-06-15 ENCOUNTER — Encounter: Payer: Self-pay | Admitting: Internal Medicine

## 2022-06-15 ENCOUNTER — Ambulatory Visit (INDEPENDENT_AMBULATORY_CARE_PROVIDER_SITE_OTHER): Payer: Medicare HMO

## 2022-06-15 VITALS — BP 129/72 | HR 75 | Temp 97.6°F | Ht 62.0 in | Wt 232.9 lb

## 2022-06-15 VITALS — BP 129/72 | HR 75 | Temp 97.6°F | Ht 62.0 in | Wt 232.1 lb

## 2022-06-15 DIAGNOSIS — M5441 Lumbago with sciatica, right side: Secondary | ICD-10-CM | POA: Diagnosis not present

## 2022-06-15 DIAGNOSIS — I5032 Chronic diastolic (congestive) heart failure: Secondary | ICD-10-CM | POA: Diagnosis not present

## 2022-06-15 DIAGNOSIS — Z0000 Encounter for general adult medical examination without abnormal findings: Secondary | ICD-10-CM | POA: Diagnosis not present

## 2022-06-15 DIAGNOSIS — E538 Deficiency of other specified B group vitamins: Secondary | ICD-10-CM

## 2022-06-15 DIAGNOSIS — I110 Hypertensive heart disease with heart failure: Secondary | ICD-10-CM | POA: Diagnosis not present

## 2022-06-15 DIAGNOSIS — E785 Hyperlipidemia, unspecified: Secondary | ICD-10-CM | POA: Diagnosis not present

## 2022-06-15 DIAGNOSIS — K219 Gastro-esophageal reflux disease without esophagitis: Secondary | ICD-10-CM

## 2022-06-15 DIAGNOSIS — J984 Other disorders of lung: Secondary | ICD-10-CM | POA: Diagnosis not present

## 2022-06-15 DIAGNOSIS — J9611 Chronic respiratory failure with hypoxia: Secondary | ICD-10-CM

## 2022-06-15 DIAGNOSIS — E7800 Pure hypercholesterolemia, unspecified: Secondary | ICD-10-CM

## 2022-06-15 DIAGNOSIS — I10 Essential (primary) hypertension: Secondary | ICD-10-CM

## 2022-06-15 DIAGNOSIS — I5030 Unspecified diastolic (congestive) heart failure: Secondary | ICD-10-CM | POA: Insufficient documentation

## 2022-06-15 HISTORY — DX: Deficiency of other specified B group vitamins: E53.8

## 2022-06-15 MED ORDER — ATORVASTATIN CALCIUM 40 MG PO TABS: 40.0000 mg | ORAL_TABLET | Freq: Every day | ORAL | 3 refills | Status: DC

## 2022-06-15 MED ORDER — ACETAMINOPHEN-CODEINE 300-30 MG PO TABS: ORAL_TABLET | ORAL | 0 refills | Status: DC

## 2022-06-15 MED ORDER — AMLODIPINE BESYLATE 10 MG PO TABS: 10.0000 mg | ORAL_TABLET | Freq: Every day | ORAL | 3 refills | Status: DC

## 2022-06-15 NOTE — Assessment & Plan Note (Signed)
-   Continue Lipitor 40mg  daily - Chronic and stable problem - Could consider Caduet

## 2022-06-15 NOTE — Assessment & Plan Note (Signed)
-   Weight is stable off of Lasix (she had not been taking). I will remove this from her med list - Stop potassium & K supplements, check BMP today - Could consider SGLT2, but I think the hassle of taking another medicine may outweigh benefits in this 86yo woman who would prefer to cut back on # of medicines she takes

## 2022-06-15 NOTE — Assessment & Plan Note (Signed)
-   Patient had been taking Vitamin B12 supplements, but I'm not sure that she needs them. Will check vitamin b12 today, and stop supplements if this is normal

## 2022-06-15 NOTE — Patient Instructions (Signed)

## 2022-06-15 NOTE — Assessment & Plan Note (Signed)
-   Refill tylenol 3 today

## 2022-06-15 NOTE — Assessment & Plan Note (Signed)
-   Stop PPI, it is not needed

## 2022-06-15 NOTE — Addendum Note (Signed)
Addended by: Derrek Monaco on: 06/15/2022 11:44 AM   Modules accepted: Level of Service

## 2022-06-15 NOTE — Patient Instructions (Addendum)
Dear [name],  It was a pleasure seeing you in clinic today.   We stopped several of your medicines! I'll call you with your lab results.  Please use your oxygen at night and if you're walking around. Bring your oxygen with you if you travel places like church or doctor's appointments. If you are sitting at home, you do not need the oxygen. I recommend that you follow up with your lung doctor. Please call him to schedule an appointment:  Dr. Marchelle Gearing   [phone]   I'll see you back in 4-5 months, and please call us if you need anything else in the meantime.  Sincerely, Dr. Mercie Eon

## 2022-06-15 NOTE — Progress Notes (Signed)
Subjective:   [name] is a 87 y.o. female who presents for an Initial Medicare Annual Wellness Visit. I connected with  Benjaman Pott on 06/15/22 by a  IN PERSON [facility]    Patient Location: Other:  IN PERSON [facility]   Provider Location: Office/Clinic  I discussed the limitations of evaluation and management by telemedicine. The patient expressed understanding and agreed to proceed.   Review of Systems    DEFERRED TO PCP  Cardiac Risk Factors include: advanced age (>37men, >60 women);hypertension;dyslipidemia;obesity (BMI >30kg/m2)     Objective:    Today's Vitals   06/15/22 1143  BP: 129/72  Pulse: 75  Temp: 97.6 F (36.4 C)  TempSrc: Oral  SpO2: 100%  Weight: 232 lb 1.6 oz (105.3 kg)  Height: 5\' 2"  (1.575 m)  PainSc: 0-No pain   Body mass index is 42.45 kg/m.     06/15/2022   11:48 AM 06/15/2022   10:05 AM 05/08/2022    1:31 AM 04/27/2022   11:57 AM 02/22/2022   10:51 AM 10/06/2021   11:59 AM 08/24/2021   10:03 AM  Advanced Directives  Does Patient Have a Medical Advance Directive? No No No No No No No  Would patient like information on creating a medical advance directive? No - Guardian declined No - Patient declined No - Patient declined No - Patient declined No - Patient declined No - Patient declined No - Patient declined    Current Medications (verified) Outpatient Encounter Medications as of 06/15/2022  Medication Sig   acetaminophen-codeine (TYLENOL #3) 300-30 MG tablet TAKE 1 TABLET EVERY 4 HOURS AS NEEDED FOR MODERATE PAIN.   albuterol (VENTOLIN HFA) 108 (90 Base) MCG/ACT inhaler INHALE 2 PUFFS INTO THE LUNGS EVERY 6 HOURS AS NEEDED FOR WHEEZING OR SHORTNESS OF BREATH.   amLODipine (NORVASC) 10 MG tablet Take 1 tablet (10 mg total) by mouth daily.   aspirin EC 81 MG tablet Take 81 mg by mouth daily.   atorvastatin (LIPITOR) 40 MG tablet Take 1 tablet (40 mg total) by mouth daily.   BREO ELLIPTA 200-25 MCG/ACT AEPB INHALE 1 PUFF INTO THE LUNGS DAILY    brimonidine (ALPHAGAN) 0.2 % ophthalmic solution Place 1 drop into the left eye daily.   carvedilol (COREG) 25 MG tablet TAKE 1 TABLET BY MOUTH TWICE DAILY WITH A MEAL (Patient taking differently: Take 25 mg by mouth 2 (two) times daily with a meal.)   Cyanocobalamin (VITAMIN B12 PO) Take 1 tablet daily by mouth.   diclofenac Sodium (VOLTAREN) 1 % GEL APPLY 2 GRAMS TOPICALLY 4 TIMES DAILY (Patient taking differently: Apply 2 g topically 4 (four) times daily as needed (Pain).)   dorzolamide-timolol (COSOPT) 22.3-6.8 MG/ML ophthalmic solution Place 1 drop into the left eye daily.   latanoprost (XALATAN) 0.005 % ophthalmic solution Place 1 drop into the left eye daily.   losartan (COZAAR) 100 MG tablet TAKE 1 TABLET (100 MG TOTAL) BY MOUTH DAILY.   No facility-administered encounter medications on file as of 06/15/2022.    Allergies (verified) Acyclovir and related, Metoprolol, and Food   History: Past Medical History:  Diagnosis Date   Allergic rhinitis, cause unspecified    Asthma    Cancer    CHF exacerbation 05/08/2022   Chronic back pain    Coronary artery calcification seen on CAT scan 04/21/2017   Enlarged pulmonary artery 04/21/2017   By chest CT   Frequent urination 11/14/2017   GERD (gastroesophageal reflux disease) 05/19/2011   history  of Bilateral leg edema-likely amlodipine induced. 07/28/2011   history of CAP (community acquired pneumonia) 07/14/2011   history of HYPOTHYROIDISM, BORDERLINE 03/20/2006   Annotation: Asymptomatic, untreated Qualifier: Diagnosis of  By: Candis Musa MD, Ruben Reason.    Hx of breast cancer    Hyperlipidemia    Hypertension    Hypothyroidism    Hypothyroidism 03/20/2006   Annotation: Asymptomatic, untreated  Qualifier: Diagnosis of   By: Candis Musa MD, Ruben Reason.      Lumbago    Murmur, cardiac 11/22/2016   OBESITY NOS 03/20/2006   Qualifier: Diagnosis of  By: Candis Musa MD, Veronique D.    OSTEOARTHRITIS 12/13/2005   Annotation: bilateral  knees;right knee injection 6/05 Qualifier: Diagnosis of  By: Candis Musa MD, Ruben Reason.    Pain, dental 09/18/2020   Preventative health care 09/04/2014   Pulmonary nodule 05/30/2017   4 mm right middle lobe nodule noted on high-resolution CT done on February 06, 2017   right shoulder pain, likely Impingement syndrome  09/09/2010   Upper respiratory infection 04/09/2018   VENOUS INSUFFICIENCY, CHRONIC 03/20/2006   Qualifier: Diagnosis of  By: Candis Musa MD, Ruben Reason.    Past Surgical History:  Procedure Laterality Date   ABDOMINAL HYSTERECTOMY     BREAST LUMPECTOMY     COLONOSCOPY     HERNIA REPAIR     HIP CLOSED REDUCTION Right 11/26/2014   Procedure: CLOSED REDUCTIION RIGHT HIP;  Surgeon: Nadara Mustard, MD;  Location: [facility] OR;  Service: Orthopedics;  Laterality: Right;   INCISIONAL HERNIA REPAIR N/A 10/29/2013   Procedure: OPEN REPAIR OF RECURRENT INCISIONAL HERNIA WITH MESH;  Surgeon: Liz Malady, MD;  Location: [facility] OR;  Service: General;  Laterality: N/A;   JOINT REPLACEMENT     TOTAL HIP ARTHROPLASTY     Family History  Problem Relation Age of Onset   Hypertension Mother    Alzheimer's disease Mother    Diabetes Sister    Colon cancer Neg Hx    Colon polyps Neg Hx    Esophageal cancer Neg Hx    Stomach cancer Neg Hx    Rectal cancer Neg Hx    Social History   Socioeconomic History   Marital status: Legally Separated    Spouse name: Not on file   Number of children: Not on file   Years of education: Not on file   Highest education level: Not on file  Occupational History   Occupation: not working  Tobacco Use   Smoking status: Former    Packs/day: 0.25    Years: 0.50    Additional pack years: 0.00    Total pack years: 0.13    Types: Cigarettes    Quit date: 09/09/1958    Years since quitting: 63.8   Smokeless tobacco: Never   Tobacco comments:    social smoker  Vaping Use   Vaping Use: Never used  Substance and Sexual Activity   Alcohol use: No     Alcohol/week: 0.0 standard drinks of alcohol   Drug use: No   Sexual activity: Not Currently  Other Topics Concern   Not on file  Social History Narrative   Lives with daughter.    Social Determinants of Health   Financial Resource Strain: Low Risk  (06/15/2022)   Overall Financial Resource Strain (CARDIA)    Difficulty of Paying Living Expenses: Not hard at all  Food Insecurity: No Food Insecurity (06/15/2022)   Hunger Vital Sign    Worried About Running Out of Food in the  Last Year: Never true    Ran Out of Food in the Last Year: Never true  Transportation Needs: No Transportation Needs (06/15/2022)   PRAPARE - Administrator, Civil Service (Medical): No    Lack of Transportation (Non-Medical): No  Physical Activity: Inactive (06/15/2022)   Exercise Vital Sign    Days of Exercise per Week: 0 days    Minutes of Exercise per Session: 0 min  Stress: Stress Concern Present (06/15/2022)   [employer] of Occupational Health - Occupational Stress Questionnaire    Feeling of Stress : To some extent  Social Connections: Moderately Integrated (06/15/2022)   Social Connection and Isolation Panel [NHANES]    Frequency of Communication with Friends and Family: More than three times a week    Frequency of Social Gatherings with Friends and Family: Once a week    Attends Religious Services: More than 4 times per year    Active Member of [employer] or Organizations: Yes    Attends Banker Meetings: 1 to 4 times per year    Marital Status: Widowed    Tobacco Counseling Counseling given: Not Answered Tobacco comments: social smoker   Clinical Intake:  Pre-visit preparation completed: Yes  Pain : No/denies pain Pain Score: 0-No pain Faces Pain Scale: No hurt  Faces Pain Scale: No hurt  BMI - recorded: 42 Nutritional Status: BMI > 30  Obese Nutritional Risks: None Diabetes: No  How often do you need to have someone help you when you read instructions,  pamphlets, or other written materials from your doctor or pharmacy?: 1 - Never What is the last grade level you completed in school?: 9 TH GRADE  Diabetic?NO  Interpreter Needed?: No  Information entered by :: [facility] Brander   Activities of Daily Living    06/15/2022   11:49 AM 06/15/2022   10:04 AM  In your present state of health, do you have any difficulty performing the following activities:  Hearing? 0 0  Vision? 0 0  Difficulty concentrating or making decisions? 0 0  Walking or climbing stairs? 1 1  Dressing or bathing? 0 0  Doing errands, shopping? 0 0  Preparing Food and eating ? N   Using the Toilet? N   In the past six months, have you accidently leaked urine? N   Do you have problems with loss of bowel control? N   Managing your Medications? N   Managing your Finances? N   Housekeeping or managing your Housekeeping? N     Patient Care Team: Mercie Eon, MD as PCP - General (Internal Medicine)  Indicate any recent Medical Services you may have received from other than [facility] providers in the past year (date may be approximate).     Assessment:   This is a routine wellness examination for [name].  Hearing/Vision screen No results found.  Dietary issues and exercise activities discussed: Current Exercise Habits: The patient does not participate in regular exercise at present   Goals Addressed   None   Depression Screen    06/15/2022   11:47 AM 06/15/2022   10:37 AM 05/19/2022   11:28 AM 04/27/2022   11:58 AM 02/22/2022   10:51 AM 08/24/2021   10:04 AM 05/26/2021   11:14 AM  PHQ 2/9 Scores  PHQ - 2 Score 0 0 0 0 0 0 0    Fall Risk    06/15/2022   11:48 AM 06/15/2022   10:04 AM 05/19/2022   11:28 AM 04/27/2022  11:56 AM 02/22/2022   10:51 AM  Fall Risk   Falls in the past year? 0 0 0 0 0  Number falls in past yr: 0 0 0 0 0  Injury with Fall? 0 0 0 0 0  Risk for fall due to : Impaired balance/gait  Impaired balance/gait    Follow up Falls evaluation  completed;Falls prevention discussed Falls evaluation completed Falls evaluation completed Falls evaluation completed Falls evaluation completed    FALL RISK PREVENTION PERTAINING TO THE HOME:  Any stairs in or around the home? No  If so, are there any without handrails? No  Home free of loose throw rugs in walkways, pet beds, electrical cords, etc? Yes  Adequate lighting in your home to reduce risk of falls? Yes   ASSISTIVE DEVICES UTILIZED TO PREVENT FALLS:  Life alert? No  Use of a cane, walker or w/c? Yes  Grab bars in the bathroom? No  Shower chair or bench in shower? NO Elevated toilet seat or a handicapped toilet? Yes   TIMED UP AND GO:  Was the test performed? No .  Length of time to ambulate 10 feet: N/A sec.     Cognitive Function:        06/15/2022   11:49 AM  6CIT Screen  What Year? 0 points  What month? 0 points  What time? 0 points  Count back from 20 0 points  Months in reverse 0 points  Repeat phrase 0 points  Total Score 0 points    Immunizations Immunization History  Administered Date(s) Administered   Fluad Quad(high Dose 65+) 12/27/2019, 02/22/2022   H1N1 03/26/2008   Influenza Split 11/25/2010, 01/19/2012   Influenza Whole 02/09/2006, 12/10/2007, 04/16/2009, 01/07/2010   Influenza, High Dose Seasonal PF 05/06/2021   Influenza,inj,Quad PF,6+ Mos 02/15/2013, 12/09/2014, 01/12/2016, 11/22/2016, 11/14/2017   PFIZER(Purple Top)SARS-COV-2 Vaccination 04/13/2019, 05/04/2019   Pneumococcal Conjugate-13 09/04/2014   Pneumococcal Polysaccharide-23 09/10/2010   Tdap 11/25/2010, 05/26/2021    TDAP status: Up to date  Flu Vaccine status: Up to date  Pneumococcal vaccine status: Up to date  Covid-19 vaccine status: Completed vaccines  Qualifies for Shingles Vaccine? Yes   Zostavax completed No   Shingrix Completed?: No.    Education has been provided regarding the importance of this vaccine. Patient has been advised to call insurance company to  determine out of pocket expense if they have not yet received this vaccine. Advised may also receive vaccine at local pharmacy or Health Dept. Verbalized acceptance and understanding.  Screening Tests Health Maintenance  Topic Date Due   Zoster Vaccines- Shingrix (1 of 2) Never done   COVID-19 Vaccine (3 - Pfizer risk series) 06/01/2019   INFLUENZA VACCINE  10/06/2022   Medicare Annual Wellness (AWV)  06/15/2023   DTaP/Tdap/Td (3 - Td or Tdap) 05/27/2031   Pneumonia Vaccine 33+ Years old  Completed   DEXA SCAN  Completed   HPV VACCINES  Aged Out    Health Maintenance  Health Maintenance Due  Topic Date Due   Zoster Vaccines- Shingrix (1 of 2) Never done   COVID-19 Vaccine (3 - Pfizer risk series) 06/01/2019    Colorectal cancer screening: Type of screening: Colonoscopy. Completed 2020. Repeat every N/A  years      Lung Cancer Screening: (Low Dose CT Chest recommended if Age 16-80 years, 30 pack-year currently smoking OR have quit w/in 15years.) does not qualify.   Lung Cancer Screening Referral: DEFERRED TO PCP   Additional Screening:  Hepatitis C Screening: does  not qualify; Completed DEFERRED TO PCP   Vision Screening: Recommended annual ophthalmology exams for early detection of glaucoma and other disorders of the eye. Is the patient up to date with their annual eye exam?  Yes  Who is the provider or what is the name of the office in which the patient attends annual eye exams? PT DID NOT KNOW THE DR NAME   If pt is not established with a provider, would they like to be referred to a provider to establish care? No .   Dental Screening: Recommended annual dental exams for proper oral hygiene  Community Resource Referral / Chronic Care Management: CRR required this visit?  No   CCM required this visit?  No      Plan:     I have personally reviewed and noted the following in the patient's chart:   Medical and social history Use of alcohol, tobacco or illicit  drugs  Current medications and supplements including opioid prescriptions. Patient is currently taking opioid prescriptions. Information provided to patient regarding non-opioid alternatives. Patient advised to discuss non-opioid treatment plan with their provider. Functional ability and status Nutritional status Physical activity Advanced directives List of other physicians Hospitalizations, surgeries, and ER visits in previous 12 months Vitals Screenings to include cognitive, depression, and falls Referrals and appointments  In addition, I have reviewed and discussed with patient certain preventive protocols, quality metrics, and best practice recommendations. A written personalized care plan for preventive services as well as general preventive health recommendations were provided to patient.     Derrell Lollingannmaria  Shepard Keltz, CMA   06/15/2022   Nurse Notes: IN PERSON    Ms. Manson PasseyBrown , Thank you for taking time to come for your Medicare Wellness Visit. I appreciate your ongoing commitment to your health goals. Please review the following plan we discussed and let me know if I can assist you in the future.   These are the goals we discussed:  Goals   None     This is a list of the screening recommended for you and due dates:  Health Maintenance  Topic Date Due   Zoster (Shingles) Vaccine (1 of 2) Never done   COVID-19 Vaccine (3 - Pfizer risk series) 06/01/2019   Flu Shot  10/06/2022   Medicare Annual Wellness Visit  06/15/2023   DTaP/Tdap/Td vaccine (3 - Td or Tdap) 05/27/2031   Pneumonia Vaccine  Completed   DEXA scan (bone density measurement)  Completed   HPV Vaccine  Aged Out

## 2022-06-15 NOTE — Assessment & Plan Note (Signed)
-   She brought in her medicines today, and is taking Amlodipine 10mg  daily, Carvedilol 25mg  BID, and Losartan 100mg  daily. Continue these medicines.  - Could try Caduet combo pill in the future to decrease pill burden

## 2022-06-15 NOTE — Assessment & Plan Note (Signed)
-   chronic and stable - I think this is multifactorial, 2/2 obesity/restrictive lung disease and mild ILD. PFTs are not consistent with COPD or asthma. She is currently euvolemic, so I don't think her chronic HFpEF is contributing to her chronic hypoxia - She was satting 95% on room air at rest today, so she does not need oxygen at rest - Continue 1-2L O2 with ambulation and overnight. She has oxygen tank with her - She will call Pulm to schedule an appointment post hospitalization.  - She gets annual high res CT for early ILD, next ordered for 12/2022 - Continue Breo & Albuterol prn

## 2022-06-16 LAB — BMP8+ANION GAP
Anion Gap: 17 mmol/L (ref 10.0–18.0)
BUN/Creatinine Ratio: 20 (ref 12–28)
BUN: 19 mg/dL (ref 8–27)
CO2: 26 mmol/L (ref 20–29)
Calcium: 9.9 mg/dL (ref 8.7–10.3)
Chloride: 101 mmol/L (ref 96–106)
Creatinine, Ser: 0.96 mg/dL (ref 0.57–1.00)
Glucose: 108 mg/dL — ABNORMAL HIGH (ref 70–99)
Potassium: 4.1 mmol/L (ref 3.5–5.2)
Sodium: 144 mmol/L (ref 134–144)
eGFR: 58 mL/min/{1.73_m2} — ABNORMAL LOW (ref >59)

## 2022-06-16 LAB — VITAMIN B12: Vitamin B-12: >2000 pg/mL — ABNORMAL HIGH (ref 232–1245)

## 2022-06-17 ENCOUNTER — Other Ambulatory Visit: Payer: Self-pay | Admitting: Internal Medicine

## 2022-06-17 DIAGNOSIS — I110 Hypertensive heart disease with heart failure: Secondary | ICD-10-CM | POA: Diagnosis not present

## 2022-06-17 DIAGNOSIS — I2510 Atherosclerotic heart disease of native coronary artery without angina pectoris: Secondary | ICD-10-CM | POA: Diagnosis not present

## 2022-06-17 DIAGNOSIS — G4733 Obstructive sleep apnea (adult) (pediatric): Secondary | ICD-10-CM | POA: Diagnosis not present

## 2022-06-17 DIAGNOSIS — I5031 Acute diastolic (congestive) heart failure: Secondary | ICD-10-CM | POA: Diagnosis not present

## 2022-06-17 DIAGNOSIS — I272 Pulmonary hypertension, unspecified: Secondary | ICD-10-CM | POA: Diagnosis not present

## 2022-06-17 DIAGNOSIS — I350 Nonrheumatic aortic (valve) stenosis: Secondary | ICD-10-CM | POA: Diagnosis not present

## 2022-06-17 DIAGNOSIS — J849 Interstitial pulmonary disease, unspecified: Secondary | ICD-10-CM | POA: Diagnosis not present

## 2022-06-17 DIAGNOSIS — J9621 Acute and chronic respiratory failure with hypoxia: Secondary | ICD-10-CM | POA: Diagnosis not present

## 2022-06-17 DIAGNOSIS — J45909 Unspecified asthma, uncomplicated: Secondary | ICD-10-CM | POA: Diagnosis not present

## 2022-06-17 MED ORDER — DORZOLAMIDE HCL 2 % OP SOLN: 1.0000 [drp] | Freq: Three times a day (TID) | OPHTHALMIC | 3 refills | Status: AC

## 2022-06-17 NOTE — Progress Notes (Signed)
Called patient to review labs results. Everything looks stable. Vitamin B12 levels are high, so she no longer needs the supplements. She understands, all questions answered.

## 2022-06-23 DIAGNOSIS — J45909 Unspecified asthma, uncomplicated: Secondary | ICD-10-CM | POA: Diagnosis not present

## 2022-06-23 DIAGNOSIS — I110 Hypertensive heart disease with heart failure: Secondary | ICD-10-CM | POA: Diagnosis not present

## 2022-06-23 DIAGNOSIS — G4733 Obstructive sleep apnea (adult) (pediatric): Secondary | ICD-10-CM | POA: Diagnosis not present

## 2022-06-23 DIAGNOSIS — I350 Nonrheumatic aortic (valve) stenosis: Secondary | ICD-10-CM | POA: Diagnosis not present

## 2022-06-23 DIAGNOSIS — J849 Interstitial pulmonary disease, unspecified: Secondary | ICD-10-CM | POA: Diagnosis not present

## 2022-06-23 DIAGNOSIS — I272 Pulmonary hypertension, unspecified: Secondary | ICD-10-CM | POA: Diagnosis not present

## 2022-06-23 DIAGNOSIS — J9621 Acute and chronic respiratory failure with hypoxia: Secondary | ICD-10-CM | POA: Diagnosis not present

## 2022-06-23 DIAGNOSIS — I5031 Acute diastolic (congestive) heart failure: Secondary | ICD-10-CM | POA: Diagnosis not present

## 2022-06-23 DIAGNOSIS — I2510 Atherosclerotic heart disease of native coronary artery without angina pectoris: Secondary | ICD-10-CM | POA: Diagnosis not present

## 2022-06-24 DIAGNOSIS — J9621 Acute and chronic respiratory failure with hypoxia: Secondary | ICD-10-CM | POA: Diagnosis not present

## 2022-06-24 DIAGNOSIS — I5031 Acute diastolic (congestive) heart failure: Secondary | ICD-10-CM | POA: Diagnosis not present

## 2022-06-24 DIAGNOSIS — J849 Interstitial pulmonary disease, unspecified: Secondary | ICD-10-CM | POA: Diagnosis not present

## 2022-06-24 DIAGNOSIS — I110 Hypertensive heart disease with heart failure: Secondary | ICD-10-CM | POA: Diagnosis not present

## 2022-06-24 DIAGNOSIS — J45909 Unspecified asthma, uncomplicated: Secondary | ICD-10-CM | POA: Diagnosis not present

## 2022-06-24 DIAGNOSIS — I2510 Atherosclerotic heart disease of native coronary artery without angina pectoris: Secondary | ICD-10-CM | POA: Diagnosis not present

## 2022-06-24 DIAGNOSIS — G4733 Obstructive sleep apnea (adult) (pediatric): Secondary | ICD-10-CM | POA: Diagnosis not present

## 2022-06-24 DIAGNOSIS — I272 Pulmonary hypertension, unspecified: Secondary | ICD-10-CM | POA: Diagnosis not present

## 2022-06-24 DIAGNOSIS — I350 Nonrheumatic aortic (valve) stenosis: Secondary | ICD-10-CM | POA: Diagnosis not present

## 2022-06-28 DIAGNOSIS — I2510 Atherosclerotic heart disease of native coronary artery without angina pectoris: Secondary | ICD-10-CM | POA: Diagnosis not present

## 2022-06-28 DIAGNOSIS — J45909 Unspecified asthma, uncomplicated: Secondary | ICD-10-CM | POA: Diagnosis not present

## 2022-06-28 DIAGNOSIS — I350 Nonrheumatic aortic (valve) stenosis: Secondary | ICD-10-CM | POA: Diagnosis not present

## 2022-06-28 DIAGNOSIS — J849 Interstitial pulmonary disease, unspecified: Secondary | ICD-10-CM | POA: Diagnosis not present

## 2022-06-28 DIAGNOSIS — J9621 Acute and chronic respiratory failure with hypoxia: Secondary | ICD-10-CM | POA: Diagnosis not present

## 2022-06-28 DIAGNOSIS — I110 Hypertensive heart disease with heart failure: Secondary | ICD-10-CM | POA: Diagnosis not present

## 2022-06-28 DIAGNOSIS — I272 Pulmonary hypertension, unspecified: Secondary | ICD-10-CM | POA: Diagnosis not present

## 2022-06-28 DIAGNOSIS — G4733 Obstructive sleep apnea (adult) (pediatric): Secondary | ICD-10-CM | POA: Diagnosis not present

## 2022-06-28 DIAGNOSIS — I5031 Acute diastolic (congestive) heart failure: Secondary | ICD-10-CM | POA: Diagnosis not present

## 2022-07-04 ENCOUNTER — Ambulatory Visit (INDEPENDENT_AMBULATORY_CARE_PROVIDER_SITE_OTHER): Payer: Medicare HMO | Admitting: Primary Care

## 2022-07-04 ENCOUNTER — Encounter: Payer: Self-pay | Admitting: Primary Care

## 2022-07-04 ENCOUNTER — Other Ambulatory Visit: Payer: Self-pay | Admitting: Internal Medicine

## 2022-07-04 VITALS — BP 130/80 | HR 84 | Temp 98.2°F | Ht 62.0 in | Wt 231.8 lb

## 2022-07-04 DIAGNOSIS — I350 Nonrheumatic aortic (valve) stenosis: Secondary | ICD-10-CM

## 2022-07-04 DIAGNOSIS — J849 Interstitial pulmonary disease, unspecified: Secondary | ICD-10-CM | POA: Diagnosis not present

## 2022-07-04 DIAGNOSIS — J984 Other disorders of lung: Secondary | ICD-10-CM | POA: Diagnosis not present

## 2022-07-04 DIAGNOSIS — G4733 Obstructive sleep apnea (adult) (pediatric): Secondary | ICD-10-CM | POA: Diagnosis not present

## 2022-07-04 DIAGNOSIS — J9611 Chronic respiratory failure with hypoxia: Secondary | ICD-10-CM

## 2022-07-04 MED ORDER — FLUTICASONE FUROATE-VILANTEROL 200-25 MCG/ACT IN AEPB: 1.0000 | INHALATION_SPRAY | Freq: Every day | RESPIRATORY_TRACT | 3 refills | Status: DC

## 2022-07-04 NOTE — Assessment & Plan Note (Addendum)
-   Encourage patient be more consistent with CPAP use

## 2022-07-04 NOTE — Patient Instructions (Addendum)
You have aortic stenosis on echocardiogram along with heart murmur, speak with cardiologist about shortness of breath  Recommendations: - Continue Breo Ellipta 1 puff daily; use Albuterol rescue inhaler 2 puffs every 4-6 hours for breakthrough shortness of breath/wheezing - Continue to wear the PAP nightly 4 to 6 hours or longer - Continue oxygen 24/7 - Work on weight loss efforts - Due for CT of your chest this fall to monitor pulmonary fibrosis  Follow-up: - 6 months with Dr. Elly Modena or sooner if needed

## 2022-07-04 NOTE — Assessment & Plan Note (Signed)
-   Echocardiogram in March showed moderate aortic stenosis, increased from May 2023 - Advised patient follow-up with cardiology due to dyspnea symptoms

## 2022-07-04 NOTE — Assessment & Plan Note (Addendum)
-   Continue supplemental oxygen to maintain O2 >88-90% - Recommending pulmonary rehab, patient declined

## 2022-07-04 NOTE — Assessment & Plan Note (Signed)
-   Due to mild fibrosis and obesity - Encourage weight loss

## 2022-07-04 NOTE — Assessment & Plan Note (Addendum)
-   Mild fibrotic lung disease is stable on most recent chest imaging from Sept 2023. ILD symptom scale remains stable. Continue to monitor clinically. Due for follow-up HRCT imaging in fall 2024 to assess for progression.

## 2022-07-04 NOTE — Progress Notes (Signed)
@Patient  ID: [name], female    DOB: Oct 21, 1935, 87 y.o.   MRN: [id number]  Chief Complaint  Patient presents with   Follow-up    Pt is here today to have breathing reassessed.  Pt states that she still becomes SOB when she exerts herself. Denies any complaints of coughing.    Referring provider: Mercie Eon, MD  HPI: 87 year old female, former light smoker quit in September 23, 1958. 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  Patient of Dr. Craige Cotta, last seen in office on 09/01/2020 for OSA. Also follows with Dr. Marchelle Gearing for ILD.   Previous [facility] pulmonary encounter: 09/01/20- Dr. Deitra Mayo She is followed by Dr. Marchelle Gearing.  Saw Elisha Headland.  Had sleep study done.  She has been experiencing snoring, apnea, sleep disruption, and daytime sleepiness.  Sleep study showed mild sleep apnea.  Her brother and her son both use CPAP.  Has nocturnal oxygen set up through [employer], but hasn't been using on consistent basis  12/16/2020 Patient presents today for follow-up.  She is looking to get portable oxygen concentrator for daytime use. During her last visit in June with Dr. Craige Cotta she was arranged to start auto CPAP. Patient continues to experience dyspnea with exertion, this is not new. She has no new or acute respiratory symptoms. She qualified for daytime oxygen today. She is maintained on Breo daily and prn albuterol hfa for asthma. She is wearing 2L oxygen at night. She has not yet been set up with CPAP, ONO is on hold until she can get set up on PAP therapy.   06/10/2021 -   Chief Complaint  Patient presents with   Follow-up    Pt states she has been doing okay since last visit and denies any real complaints.   Multifactorial dyspnea from morbid obesity associated sleep apnea, paralyzed hemidiaphragm, ejection systolic  murmur.  And grade 1 diastolic dysfunction in November 2021 echo.  Abnormal CT scan of the chest October 2021 with possible early ILD.  Possible asthma on inhaler therapy with Breo  [name] 87 y.o. -returns for follow-up.  I last saw her in October 2021 at which time initiated the dyspnea work-up.  It appears since then based on review of the chart she is on CPAP nightly with Dr. Craige Cotta.  She also tells me she is on oxygen x1 year for night and with exertion 2 L but not at rest.  Overall she feels she is better and stable.  She is able to walk longer but she attributes this to oxygen.  Without the oxygen her dyspnea is worse.  In the interim there are no emergency room visits or urgent care visits.  She states no new medical problems.  The only medication change in the last 6 months is that her iron supplement has been removed.  Recent lab 05/26/2021: Reviewed CBC normal  She was sitting there on 2 L nasal cannula.  I turned the oxygen off and 10 minutes later when we rechecked her pulse ox was 92% on room air at rest 06/10/21  CT Chest data  No results found.    12/31/2021 Multifactorial dyspnea from morbid obesity associated sleep apnea, paralyzed hemidiaphragm, ejection systolic murmur.  And grade 1 diastolic dysfunction in November 2021 echo.  Abnormal CT scan of the chest October 2021 with possible early ILD.  Possible asthma on inhaler therapy with  Breo  Patient presents today for follow-up/ PFTs.   Breathing is about the same. She has dyspnea symptoms with exertion only. She is able to do most ADLs but needs to sit to rest. No coughing symptoms. She wears oxygen during the day on occasional and at night. She is compliant with CPAP use.   CT chest in September 2023 showed mild fibrotic lung disease, unchanged since previous imaging  Severe elevation left hemidiaphragm remains unchanged  Pulmonary function testing shows severe restriction, however diffusion, capacity remains stable  if not some better compared to previous.   07/04/2022- Interim hx  Patient presents today for follow-up. PMH sleep apnea, paralyzed hemidiaphragm, diastolic dysfunction, asthma, possible early ILD. Breathing is about the same, no worse or better. Maintained on BREO , supplemental oxygen and CPAP. Need refill of her maintenance inhaler, she ran out two days ago. Rarely requires her albuterol inhaler. She gets winded/tired with mild exertion. Very seldomly has wheezing symptoms or cough. She has mild fibrotic lung disease which appears stable on most recent chest imaging from Sept 2023 and pulmonary function testing. Minimally symptomatic. Continue monitor clinically. Due for repeat CT chest in October 2024. Declined pulmonary rehab.    SYMPTOM SCALE - ILD 01/01/2020 Last Weight  Most recent update: 01/01/2020 11:12 AM      Weight  112.9 kg (248 lb 12.8 oz)                     12/31/2021  07/04/2022   O2 use ra RA RA  Shortness of Breath 0 -> 5 scale with 5 being worst (score 6 If unable to do)    At rest 4 0 0  Simple tasks - showers, clothes change, eating, shaving 5 3 3   Household (dishes, doing bed, laundry) 5 3 3   Shopping 5 NA X   Walking level at own pace 5 4 3  (has to sit to recover)  Walking up Stairs 6 6 X- 6  Total (30-36) Dyspnea Score x 16 15  How bad is your cough? Yes with wheeze 0 0  How bad is your fatigue yes 0 0  How bad is nausea 00 0 0  How bad is vomiting?   0 0 0  How bad is diarrhea? 0 0 0  How bad is anxiety? 0 0 0  How bad is depression 0 0 0     Allergies  Allergen Reactions   Acyclovir And Related    Metoprolol Itching   Food Rash and Other (See Comments)    Pt states that she is allergic to pickles.      Immunization History  Administered Date(s) Administered   Fluad Quad(high Dose 65+) 12/27/2019, 02/22/2022   H1N1 03/26/2008   Influenza Split 11/25/2010, 01/19/2012   Influenza Whole 02/09/2006, 12/10/2007, 04/16/2009, 01/07/2010    Influenza, High Dose Seasonal PF 05/06/2021   Influenza,inj,Quad PF,6+ Mos 02/15/2013, 12/09/2014, 01/12/2016, 11/22/2016, 11/14/2017   PFIZER(Purple Top)SARS-COV-2 Vaccination 04/13/2019, 05/04/2019   Pneumococcal Conjugate-13 09/04/2014   Pneumococcal Polysaccharide-23 09/10/2010   Tdap 11/25/2010, 05/26/2021    Past Medical History:  Diagnosis Date   Allergic rhinitis, cause unspecified    Asthma    Cancer (HCC)    CHF exacerbation (HCC) 05/08/2022   Chronic back pain    Coronary artery calcification seen on CAT scan 04/21/2017   Enlarged pulmonary artery (HCC) 04/21/2017   By chest CT   Frequent urination 11/14/2017   GERD (gastroesophageal reflux disease) 05/19/2011   history of Bilateral  leg edema-likely amlodipine induced. 07/28/2011   history of CAP (community acquired pneumonia) 07/14/2011   history of HYPOTHYROIDISM, BORDERLINE 03/20/2006   Annotation: Asymptomatic, untreated Qualifier: Diagnosis of  By: Candis Musa MD, Ruben Reason.    Hx of breast cancer    Hyperlipidemia    Hypertension    Hypothyroidism    Hypothyroidism 03/20/2006   Annotation: Asymptomatic, untreated  Qualifier: Diagnosis of   By: Candis Musa MD, Ruben Reason.      Lumbago    Murmur, cardiac 11/22/2016   OBESITY NOS 03/20/2006   Qualifier: Diagnosis of  By: Candis Musa MD, Veronique D.    OSTEOARTHRITIS 12/13/2005   Annotation: bilateral knees;right knee injection 6/05 Qualifier: Diagnosis of  By: Candis Musa MD, Ruben Reason.    Pain, dental 09/18/2020   Preventative health care 09/04/2014   Pulmonary nodule 05/30/2017   4 mm right middle lobe nodule noted on high-resolution CT done on February 06, 2017   right shoulder pain, likely Impingement syndrome  09/09/2010   Upper respiratory infection 04/09/2018   VENOUS INSUFFICIENCY, CHRONIC 03/20/2006   Qualifier: Diagnosis of  By: Candis Musa MD, Ruben Reason.     Tobacco History: Social History   Tobacco Use  Smoking Status Former   Packs/day: 0.25    Years: 0.50   Additional pack years: 0.00   Total pack years: 0.13   Types: Cigarettes   Quit date: 09/09/1958   Years since quitting: 63.8  Smokeless Tobacco Never  Tobacco Comments   social smoker   Counseling given: Not Answered Tobacco comments: social smoker   Outpatient Medications Prior to Visit  Medication Sig Dispense Refill   acetaminophen-codeine (TYLENOL #3) 300-30 MG tablet TAKE 1 TABLET EVERY 4 HOURS AS NEEDED FOR MODERATE PAIN. 30 tablet 0   albuterol (VENTOLIN HFA) 108 (90 Base) MCG/ACT inhaler INHALE 2 PUFFS INTO THE LUNGS EVERY 6 HOURS AS NEEDED FOR WHEEZING OR SHORTNESS OF BREATH. 1 each 3   amLODipine (NORVASC) 10 MG tablet Take 1 tablet (10 mg total) by mouth daily. 90 tablet 3   aspirin EC 81 MG tablet Take 81 mg by mouth daily.     atorvastatin (LIPITOR) 40 MG tablet Take 1 tablet (40 mg total) by mouth daily. 90 tablet 3   brimonidine (ALPHAGAN) 0.2 % ophthalmic solution Place 1 drop into the left eye daily.     carvedilol (COREG) 25 MG tablet TAKE 1 TABLET BY MOUTH TWICE DAILY WITH A MEAL (Patient taking differently: Take 25 mg by mouth 2 (two) times daily with a meal.) 180 tablet 3   Cyanocobalamin (VITAMIN B12 PO) Take 1 tablet daily by mouth.     diclofenac Sodium (VOLTAREN) 1 % GEL APPLY 2 GRAMS TOPICALLY 4 TIMES DAILY (Patient taking differently: Apply 2 g topically 4 (four) times daily as needed (Pain).) 700 g 10   dorzolamide (TRUSOPT) 2 % ophthalmic solution Place 1 drop into the left eye 3 (three) times daily. 10 mL 3   latanoprost (XALATAN) 0.005 % ophthalmic solution Place 1 drop into the left eye daily.     losartan (COZAAR) 100 MG tablet TAKE 1 TABLET (100 MG TOTAL) BY MOUTH DAILY. 90 tablet 3   BREO ELLIPTA 200-25 MCG/ACT AEPB INHALE 1 PUFF INTO THE LUNGS DAILY 180 each 3   No facility-administered medications prior to visit.    Review of Systems  Review of Systems  Constitutional: Negative.   HENT: Negative.    Respiratory:  Positive for  shortness of breath. Negative for cough and wheezing.  Cardiovascular: Negative.     Physical Exam  BP 130/80 (BP Location: Right Arm, Patient Position: Sitting, Cuff Size: Large)   Pulse 84   Temp 98.2 F (36.8 C) (Oral)   Ht 5\' 2"  (1.575 m)   Wt 231 lb 12.8 oz (105.1 kg)   SpO2 93% Comment: 2L cont  BMI 42.40 kg/m  Physical Exam Constitutional:      General: She is not in acute distress.    Appearance: Normal appearance. She is obese. She is not ill-appearing.  HENT:     Head: Normocephalic and atraumatic.     Mouth/Throat:     Mouth: Mucous membranes are moist.     Pharynx: Oropharynx is clear.  Cardiovascular:     Rate and Rhythm: Normal rate and regular rhythm.     Heart sounds: Murmur heard.  Pulmonary:     Effort: Pulmonary effort is normal.     Breath sounds: Normal breath sounds. No wheezing, rhonchi or rales.  Musculoskeletal:     Comments: Ambulates with rolling walker  Skin:    General: Skin is warm and dry.  Neurological:     General: No focal deficit present.     Mental Status: She is alert and oriented to person, place, and time. Mental status is at baseline.  Psychiatric:        Mood and Affect: Mood normal.        Behavior: Behavior normal.        Thought Content: Thought content normal.        Judgment: Judgment normal.      Lab Results:  CBC    Component Value Date/Time   WBC 6.1 05/08/2022 0712   RBC 3.96 05/08/2022 0712   HGB 11.0 (L) 05/08/2022 0712   HGB 11.7 04/27/2022 1135   HCT 33.1 (L) 05/08/2022 0712   HCT 36.3 04/27/2022 1135   PLT 197 05/08/2022 0712   PLT 215 04/27/2022 1135   MCV 83.6 05/08/2022 0712   MCV 85 04/27/2022 1135   MCH 27.8 05/08/2022 0712   MCHC 33.2 05/08/2022 0712   RDW 13.4 05/08/2022 0712   RDW 13.0 04/27/2022 1135   LYMPHSABS 1.6 05/07/2022 1850   LYMPHSABS 1.6 05/26/2021 1218   MONOABS 0.8 05/07/2022 1850   EOSABS 0.2 05/07/2022 1850   EOSABS 0.4 05/26/2021 1218   BASOSABS 0.1 05/07/2022 1850    BASOSABS 0.1 05/26/2021 1218    BMET    Component Value Date/Time   NA 144 06/15/2022 1035   K 4.1 06/15/2022 1035   CL 101 06/15/2022 1035   CO2 26 06/15/2022 1035   GLUCOSE 108 (H) 06/15/2022 1035   GLUCOSE 114 (H) 05/11/2022 0633   BUN 19 06/15/2022 1035   CREATININE 0.96 06/15/2022 1035   CREATININE 0.91 09/23/2014 1415   CALCIUM 9.9 06/15/2022 1035   GFRNONAA >60 05/11/2022 0633   GFRNONAA 61 09/23/2014 1415   GFRAA 66 12/27/2019 1028   GFRAA 71 09/23/2014 1415    BNP    Component Value Date/Time   BNP 189.0 (H) 05/07/2022 1850    ProBNP No results found for: "PROBNP"  Imaging: No results found.   Assessment & Plan:   OSA (obstructive sleep apnea) - Encourage patient be more consistent with CPAP use     Chronic respiratory failure with hypoxia (HCC) - Continue supplemental oxygen to maintain O2 >88-90% - Recommending pulmonary rehab, patient declined   ILD (interstitial lung disease) (HCC) - Mild fibrotic lung disease is stable on most recent  chest imaging from Sept 2023. ILD symptom scale remains stable. Continue to monitor clinically. Due for follow-up HRCT imaging in fall 2024 to assess for progression.   Restrictive lung disease - Due to mild fibrosis and obesity - Encourage weight loss  Moderate aortic stenosis - Echocardiogram in March showed moderate aortic stenosis, increased from May 2023 - Advised patient follow-up with cardiology due to dyspnea symptoms   Glenford Bayley, NP 07/04/2022

## 2022-07-06 ENCOUNTER — Other Ambulatory Visit: Payer: Self-pay | Admitting: Internal Medicine

## 2022-07-06 DIAGNOSIS — I350 Nonrheumatic aortic (valve) stenosis: Secondary | ICD-10-CM | POA: Diagnosis not present

## 2022-07-06 DIAGNOSIS — J9621 Acute and chronic respiratory failure with hypoxia: Secondary | ICD-10-CM | POA: Diagnosis not present

## 2022-07-06 DIAGNOSIS — I2510 Atherosclerotic heart disease of native coronary artery without angina pectoris: Secondary | ICD-10-CM | POA: Diagnosis not present

## 2022-07-06 DIAGNOSIS — I5031 Acute diastolic (congestive) heart failure: Secondary | ICD-10-CM | POA: Diagnosis not present

## 2022-07-06 DIAGNOSIS — J45909 Unspecified asthma, uncomplicated: Secondary | ICD-10-CM | POA: Diagnosis not present

## 2022-07-06 DIAGNOSIS — M5441 Lumbago with sciatica, right side: Secondary | ICD-10-CM

## 2022-07-06 DIAGNOSIS — J849 Interstitial pulmonary disease, unspecified: Secondary | ICD-10-CM | POA: Diagnosis not present

## 2022-07-06 DIAGNOSIS — I272 Pulmonary hypertension, unspecified: Secondary | ICD-10-CM | POA: Diagnosis not present

## 2022-07-06 DIAGNOSIS — G4733 Obstructive sleep apnea (adult) (pediatric): Secondary | ICD-10-CM | POA: Diagnosis not present

## 2022-07-06 DIAGNOSIS — I110 Hypertensive heart disease with heart failure: Secondary | ICD-10-CM | POA: Diagnosis not present

## 2022-07-06 MED ORDER — ACETAMINOPHEN-CODEINE 300-30 MG PO TABS: ORAL_TABLET | ORAL | 0 refills | Status: DC

## 2022-07-06 NOTE — Telephone Encounter (Signed)
  acetaminophen-codeine (TYLENOL #3) 300-30 MG tablet    [id number] - [place], [place] - [address] AT [address] (Ph: [phone])

## 2022-07-07 ENCOUNTER — Telehealth: Payer: Self-pay | Admitting: *Deleted

## 2022-07-07 DIAGNOSIS — M5441 Lumbago with sciatica, right side: Secondary | ICD-10-CM

## 2022-07-07 MED ORDER — ACETAMINOPHEN-CODEINE 300-30 MG PO TABS: ORAL_TABLET | ORAL | 0 refills | Status: DC

## 2022-07-07 NOTE — Telephone Encounter (Addendum)
Patient called in stating her T3 is not at [facility]. Explained it was sent yesterday to [facility] on [address]. States she prefers to use [facility] as it is closer to her home. Rx cancelled with [facility]. Sallee Provencal states that she explained to patient that Rx cannot be filled till 07/15/22. Please resend to [facility]. [facility] has been removed from list.

## 2022-07-07 NOTE — Telephone Encounter (Signed)
Patient notified that Rx has been sent to [facility]. Explained that earliest date to p/u is 5/10. She was very Adult nurse.

## 2022-07-28 DIAGNOSIS — Z961 Presence of intraocular lens: Secondary | ICD-10-CM | POA: Diagnosis not present

## 2022-07-28 DIAGNOSIS — H348322 Tributary (branch) retinal vein occlusion, left eye, stable: Secondary | ICD-10-CM | POA: Diagnosis not present

## 2022-07-28 DIAGNOSIS — H1045 Other chronic allergic conjunctivitis: Secondary | ICD-10-CM | POA: Diagnosis not present

## 2022-07-28 DIAGNOSIS — H401123 Primary open-angle glaucoma, left eye, severe stage: Secondary | ICD-10-CM | POA: Diagnosis not present

## 2022-07-28 DIAGNOSIS — H25811 Combined forms of age-related cataract, right eye: Secondary | ICD-10-CM | POA: Diagnosis not present

## 2022-07-28 DIAGNOSIS — H31092 Other chorioretinal scars, left eye: Secondary | ICD-10-CM | POA: Diagnosis not present

## 2022-07-28 DIAGNOSIS — H26492 Other secondary cataract, left eye: Secondary | ICD-10-CM | POA: Diagnosis not present

## 2022-07-29 ENCOUNTER — Telehealth (HOSPITAL_COMMUNITY): Payer: Self-pay | Admitting: *Deleted

## 2022-07-29 NOTE — Telephone Encounter (Signed)
Reaching out to patient to offer assistance regarding upcoming cardiac imaging study; pt verbalizes understanding of appt date/time, parking situation and where to check in, pre-test NPO status, and verified current allergies; name and call back number provided for further questions should they arise  Nathaly Dawkins RN Navigator Cardiac Imaging [facility] [phone] office [phone] cell  Patient aware to avoid caffeine 12 hours prior to her cardiac PET scan.  

## 2022-08-03 ENCOUNTER — Encounter (HOSPITAL_COMMUNITY): Admission: RE | Admit: 2022-08-03 | Payer: Medicare HMO | Source: Ambulatory Visit

## 2022-08-08 ENCOUNTER — Ambulatory Visit (HOSPITAL_COMMUNITY): Payer: Medicare HMO | Attending: Cardiology

## 2022-08-08 DIAGNOSIS — I350 Nonrheumatic aortic (valve) stenosis: Secondary | ICD-10-CM | POA: Diagnosis not present

## 2022-08-08 LAB — ECHOCARDIOGRAM COMPLETE
AR max vel: 1.35 cm2
AV Area VTI: 1.27 cm2
AV Area mean vel: 1.25 cm2
AV Mean grad: 18 mmHg
AV Peak grad: 29.5 mmHg
Ao pk vel: 2.72 m/s
Area-P 1/2: 3.91 cm2
S' Lateral: 2.3 cm

## 2022-08-09 ENCOUNTER — Ambulatory Visit (INDEPENDENT_AMBULATORY_CARE_PROVIDER_SITE_OTHER): Payer: Medicare HMO | Admitting: Student

## 2022-08-09 ENCOUNTER — Other Ambulatory Visit: Payer: Self-pay

## 2022-08-09 ENCOUNTER — Encounter: Payer: Self-pay | Admitting: Student

## 2022-08-09 VITALS — BP 113/67 | HR 66 | Temp 98.2°F | Ht 62.0 in | Wt 228.2 lb

## 2022-08-09 DIAGNOSIS — M5441 Lumbago with sciatica, right side: Secondary | ICD-10-CM | POA: Diagnosis not present

## 2022-08-09 DIAGNOSIS — K529 Noninfective gastroenteritis and colitis, unspecified: Secondary | ICD-10-CM | POA: Diagnosis not present

## 2022-08-09 DIAGNOSIS — K219 Gastro-esophageal reflux disease without esophagitis: Secondary | ICD-10-CM

## 2022-08-09 MED ORDER — PANTOPRAZOLE SODIUM 40 MG PO TBEC: 40.0000 mg | DELAYED_RELEASE_TABLET | Freq: Every day | ORAL | 3 refills | Status: DC

## 2022-08-09 MED ORDER — ACETAMINOPHEN-CODEINE 300-30 MG PO TABS: ORAL_TABLET | ORAL | 0 refills | Status: DC

## 2022-08-09 NOTE — Patient Instructions (Signed)
Thank you so much for coming to the clinic today!   For your diarrhea, I think this may be a viral illness. Hopefully it will clear out soon, if it does not please make an appointment to come back. For your burning pain in your stomach, I am going to start you on protonix, which is an acid suppression medication.    If you have any questions please feel free to the call the clinic at anytime at [phone]. It was a pleasure seeing you!  Best, Dr. Thomasene Ripple

## 2022-08-10 DIAGNOSIS — K529 Noninfective gastroenteritis and colitis, unspecified: Secondary | ICD-10-CM | POA: Insufficient documentation

## 2022-08-10 NOTE — Assessment & Plan Note (Signed)
Patient states for the last week or so and she feels like after meals she gets this burning in her epigastric area.  The pain is nonradiating, it only occurs about an hour after she eats.  It gets better when she drinks water and with time.  She also has a feeling of food getting "stuck" in her chest area.  She states this is how it feels when she was experiencing her heartburn previously.  She denies any episodes of choking, and has not had any decreased appetite or intake.  Symptoms seem consistent with GERD, will retrial her on Protonix once a day.  Plan: - Protonix 40 mg

## 2022-08-10 NOTE — Assessment & Plan Note (Signed)
Refilled Tylenol 3 today

## 2022-08-10 NOTE — Assessment & Plan Note (Signed)
Patient presents with a week history of loose stools after eating.  She says this happens after breakfast, lunch, and dinner.  The stool is now on bloody, and has not formed.  She is euvolemic, and does not endorse symptoms of dehydration.  Volume of stools is also not increased.  Denies any sick contacts.  Denies any pain when having bowel movement.  She does endorse that last week, the day before symptom onset, her daughter brought her a sandwich from [employer] which is not something she usually eats.  Overall, clinical picture is consistent with a viral gastroenteritis.  Advised patient to stay hydrated, and if frequency does increase or she starts to experience significant pain to return to clinic.

## 2022-08-10 NOTE — Progress Notes (Signed)
CC: Loose stools/epigastric pain  HPI:  [name] is a 87 y.o. female living with a history stated below and presents today for loose stools/epigastric pain. Please see problem based assessment and plan for additional details.  Past Medical History:  Diagnosis Date   Allergic rhinitis, cause unspecified    Asthma    B12 deficiency 06/15/2022   Cancer (HCC)    CHF exacerbation (HCC) 05/08/2022   Chronic back pain    Coronary artery calcification seen on CAT scan 04/21/2017   Enlarged pulmonary artery (HCC) 04/21/2017   By chest CT   Frequent urination 11/14/2017   GERD (gastroesophageal reflux disease) 05/19/2011   history of Bilateral leg edema-likely amlodipine induced. 07/28/2011   history of CAP (community acquired pneumonia) 07/14/2011   history of HYPOTHYROIDISM, BORDERLINE 03/20/2006   Annotation: Asymptomatic, untreated Qualifier: Diagnosis of  By: Candis Musa MD, Ruben Reason.    Hx of breast cancer    Hyperlipidemia    Hypertension    Hypothyroidism    Hypothyroidism 03/20/2006   Annotation: Asymptomatic, untreated  Qualifier: Diagnosis of   By: Candis Musa MD, Ruben Reason.      Lumbago    Murmur, cardiac 11/22/2016   OBESITY NOS 03/20/2006   Qualifier: Diagnosis of  By: Candis Musa MD, Veronique D.    OSTEOARTHRITIS 12/13/2005   Annotation: bilateral knees;right knee injection 6/05 Qualifier: Diagnosis of  By: Candis Musa MD, Ruben Reason.    Pain, dental 09/18/2020   Preventative health care 09/04/2014   Pulmonary nodule 05/30/2017   4 mm right middle lobe nodule noted on high-resolution CT done on February 06, 2017   right shoulder pain, likely Impingement syndrome  09/09/2010   Upper respiratory infection 04/09/2018   VENOUS INSUFFICIENCY, CHRONIC 03/20/2006   Qualifier: Diagnosis of  By: Candis Musa MD, Ruben Reason.     Current Outpatient Medications on File Prior to Visit  Medication Sig Dispense Refill   albuterol (VENTOLIN HFA) 108 (90 Base) MCG/ACT inhaler  INHALE 2 PUFFS INTO THE LUNGS EVERY 6 HOURS AS NEEDED FOR WHEEZING OR SHORTNESS OF BREATH. 1 each 3   amLODipine (NORVASC) 10 MG tablet Take 1 tablet (10 mg total) by mouth daily. 90 tablet 3   aspirin EC 81 MG tablet Take 81 mg by mouth daily.     atorvastatin (LIPITOR) 40 MG tablet Take 1 tablet (40 mg total) by mouth daily. 90 tablet 3   brimonidine (ALPHAGAN) 0.2 % ophthalmic solution Place 1 drop into the left eye daily.     carvedilol (COREG) 25 MG tablet TAKE 1 TABLET BY MOUTH TWICE DAILY WITH A MEAL (Patient taking differently: Take 25 mg by mouth 2 (two) times daily with a meal.) 180 tablet 3   diclofenac Sodium (VOLTAREN) 1 % GEL APPLY 2 GRAMS TOPICALLY 4 TIMES DAILY (Patient taking differently: Apply 2 g topically 4 (four) times daily as needed (Pain).) 700 g 10   dorzolamide (TRUSOPT) 2 % ophthalmic solution Place 1 drop into the left eye 3 (three) times daily. 10 mL 3   fluticasone furoate-vilanterol (BREO ELLIPTA) 200-25 MCG/ACT AEPB Inhale 1 puff into the lungs daily. 180 each 3   latanoprost (XALATAN) 0.005 % ophthalmic solution Place 1 drop into the left eye daily.     losartan (COZAAR) 100 MG tablet TAKE 1 TABLET (100 MG TOTAL) BY MOUTH DAILY. 90 tablet 3   No current facility-administered medications on file prior to visit.    Family History  Problem Relation Age of Onset   Hypertension Mother  Alzheimer's disease Mother    Diabetes Sister    Colon cancer Neg Hx    Colon polyps Neg Hx    Esophageal cancer Neg Hx    Stomach cancer Neg Hx    Rectal cancer Neg Hx     Social History   Socioeconomic History   Marital status: Legally Separated    Spouse name: Not on file   Number of children: Not on file   Years of education: Not on file   Highest education level: Not on file  Occupational History   Occupation: not working  Tobacco Use   Smoking status: Former    Packs/day: 0.25    Years: 0.50    Additional pack years: 0.00    Total pack years: 0.13    Types:  Cigarettes    Quit date: 09/09/1958    Years since quitting: 63.9   Smokeless tobacco: Never   Tobacco comments:    social smoker  Vaping Use   Vaping Use: Never used  Substance and Sexual Activity   Alcohol use: No    Alcohol/week: 0.0 standard drinks of alcohol   Drug use: No   Sexual activity: Not Currently  Other Topics Concern   Not on file  Social History Narrative   Lives with daughter.    Social Determinants of Health   Financial Resource Strain: Low Risk  (06/15/2022)   Overall Financial Resource Strain (CARDIA)    Difficulty of Paying Living Expenses: Not hard at all  Food Insecurity: No Food Insecurity (06/15/2022)   Hunger Vital Sign    Worried About Running Out of Food in the Last Year: Never true    Ran Out of Food in the Last Year: Never true  Transportation Needs: No Transportation Needs (06/15/2022)   PRAPARE - Administrator, Civil Service (Medical): No    Lack of Transportation (Non-Medical): No  Physical Activity: Inactive (06/15/2022)   Exercise Vital Sign    Days of Exercise per Week: 0 days    Minutes of Exercise per Session: 0 min  Stress: Stress Concern Present (06/15/2022)   [employer] of Occupational Health - Occupational Stress Questionnaire    Feeling of Stress : To some extent  Social Connections: Moderately Integrated (06/15/2022)   Social Connection and Isolation Panel [NHANES]    Frequency of Communication with Friends and Family: More than three times a week    Frequency of Social Gatherings with Friends and Family: Once a week    Attends Religious Services: More than 4 times per year    Active Member of [employer] or Organizations: Yes    Attends Banker Meetings: 1 to 4 times per year    Marital Status: Widowed  Intimate Partner Violence: Not At Risk (06/15/2022)   Humiliation, Afraid, Rape, and Kick questionnaire    Fear of Current or Ex-Partner: No    Emotionally Abused: No    Physically Abused: No    Sexually  Abused: No    Review of Systems: ROS negative except for what is noted on the assessment and plan.  Vitals:   08/09/22 1033  BP: 113/67  Pulse: 66  Temp: 98.2 F (36.8 C)  TempSrc: Oral  SpO2: 100%  Weight: 228 lb 3.2 oz (103.5 kg)  Height: 5\' 2"  (1.575 m)    Physical Exam: Constitutional: Elderly female, in no acute distress HENT: normocephalic atraumatic, mucous membranes moist, on 2 L nasal cannula Cardiovascular: regular rate and rhythm, no m/r/g Pulmonary/Chest: normal work  of breathing on room air, lungs clear to auscultation bilaterally Abdominal: soft, non-tender, non-distended, normal active bowel sounds   Assessment & Plan:   Gastroenteritis Patient presents with a week history of loose stools after eating.  She says this happens after breakfast, lunch, and dinner.  The stool is now on bloody, and has not formed.  She is euvolemic, and does not endorse symptoms of dehydration.  Volume of stools is also not increased.  Denies any sick contacts.  Denies any pain when having bowel movement.  She does endorse that last week, the day before symptom onset, her daughter brought her a sandwich from [employer] which is not something she usually eats.  Overall, clinical picture is consistent with a viral gastroenteritis.  Advised patient to stay hydrated, and if frequency does increase or she starts to experience significant pain to return to clinic.    GERD (gastroesophageal reflux disease) Patient states for the last week or so and she feels like after meals she gets this burning in her epigastric area.  The pain is nonradiating, it only occurs about an hour after she eats.  It gets better when she drinks water and with time.  She also has a feeling of food getting "stuck" in her chest area.  She states this is how it feels when she was experiencing her heartburn previously.  She denies any episodes of choking, and has not had any decreased appetite or intake.  Symptoms seem  consistent with GERD, will retrial her on Protonix once a day.  Plan: - Protonix 40 mg  Right-sided low back pain with right-sided sciatica Refilled Tylenol 3 today  Patient discussed with Dr. Dierdre Forth Khyle Goodell, M.D. [facility] Internal Medicine, PGY-1 Pager: [phone] Date 08/10/2022 Time 7:37 AM

## 2022-08-11 NOTE — Addendum Note (Signed)
Addended by: Derrek Monaco on: 08/11/2022 12:32 PM   Modules accepted: Level of Service

## 2022-08-11 NOTE — Progress Notes (Signed)
Internal Medicine Clinic Attending  Case discussed with Dr. Nooruddin  At the time of the visit.  We reviewed the resident's history and exam and pertinent patient test results.  I agree with the assessment, diagnosis, and plan of care documented in the resident's note.  

## 2022-09-18 NOTE — Progress Notes (Signed)
Cardiology Office Note:    Date:  09/20/2022   ID:  Benjaman Pott, DOB [birth year], MRN [id number]  PCP:  Mercie Eon, MD  Cardiologist:  None  Electrophysiologist:  None   Referring MD: Mercie Eon, MD   Chief Complaint  Patient presents with   Shortness of Breath    History of Present Illness:    [name] is a 87 y.o. female with a hx of asthma, breast cancer, hypertension, hyperlipidemia, obesity who presents for follow-up.  She was referred by Dr. Heide Spark for evaluation of aortic stenosis, initially seen 09/02/2021.  She denies any chest pain, lightheadedness, syncope, or palpitations.  Does report she gets short of breath with minimal exertion.  Does improve with inhaler use.  Reports swelling in her feet, uses compression stockings.  Smoked in her 49s.  Family history includes both her daughters had strokes.  Echocardiogram 08/03/2021 showed EF 65 to 70%, normal RV function, moderate aortic stenosis.    She was admitted 05/2022 with acute on chronic diastolic heart failure.  She was diuresed with IV Lasix and discharged on p.o. Lasix 40 mg daily.  Also treated for possible ILD flare with prednisone.  Echocardiogram 05/07/2022 showed EF 60 to 65%, normal RV function, moderate aortic stenosis.  Echocardiogram 08/2022 showed EF 60 to 65%, RV not well-visualized, small pericardial effusion, mild mitral regurgitation, mild to moderate aortic stenosis.  Since last clinic visit, she reports she is doing okay.  States that she continues to have shortness of breath but denies any chest pain.  Reports an episode of lightheadedness with standing recently but denies any syncope.  Reports continues to have lower extremity edema.  She denies any palpitations.  Weight is down 5 pounds from last clinic visit.  Reports she stopped taking her Lasix and has noted some swelling since then.   Wt Readings from Last 3 Encounters:  09/20/22 227 lb 9.6 oz (103.2 kg)  08/09/22 228 lb 3.2 oz (103.5 kg)   07/04/22 231 lb 12.8 oz (105.1 kg)     Past Medical History:  Diagnosis Date   Allergic rhinitis, cause unspecified    Asthma    B12 deficiency 06/15/2022   Cancer (HCC)    CHF exacerbation (HCC) 05/08/2022   Chronic back pain    Coronary artery calcification seen on CAT scan 04/21/2017   Enlarged pulmonary artery (HCC) 04/21/2017   By chest CT   Frequent urination 11/14/2017   GERD (gastroesophageal reflux disease) 05/19/2011   history of Bilateral leg edema-likely amlodipine induced. 07/28/2011   history of CAP (community acquired pneumonia) 07/14/2011   history of HYPOTHYROIDISM, BORDERLINE 03/20/2006   Annotation: Asymptomatic, untreated Qualifier: Diagnosis of  By: Candis Musa MD, Ruben Reason.    Hx of breast cancer    Hyperlipidemia    Hypertension    Hypothyroidism    Hypothyroidism 03/20/2006   Annotation: Asymptomatic, untreated  Qualifier: Diagnosis of   By: Candis Musa MD, Ruben Reason.      Lumbago    Murmur, cardiac 11/22/2016   OBESITY NOS 03/20/2006   Qualifier: Diagnosis of  By: Candis Musa MD, Veronique D.    OSTEOARTHRITIS 12/13/2005   Annotation: bilateral knees;right knee injection 6/05 Qualifier: Diagnosis of  By: Candis Musa MD, Ruben Reason.    Pain, dental 09/18/2020   Preventative health care 09/04/2014   Pulmonary nodule 05/30/2017   4 mm right middle lobe nodule noted on high-resolution CT done on February 06, 2017   right shoulder pain, likely Impingement syndrome  09/09/2010  Upper respiratory infection 04/09/2018   VENOUS INSUFFICIENCY, CHRONIC 03/20/2006   Qualifier: Diagnosis of  By: Candis Musa MD, Ruben Reason.     Past Surgical History:  Procedure Laterality Date   ABDOMINAL HYSTERECTOMY     BREAST LUMPECTOMY     COLONOSCOPY     HERNIA REPAIR     HIP CLOSED REDUCTION Right 11/26/2014   Procedure: CLOSED REDUCTIION RIGHT HIP;  Surgeon: Nadara Mustard, MD;  Location: [facility] OR;  Service: Orthopedics;  Laterality: Right;   INCISIONAL HERNIA REPAIR N/A  10/29/2013   Procedure: OPEN REPAIR OF RECURRENT INCISIONAL HERNIA WITH MESH;  Surgeon: Liz Malady, MD;  Location: [facility] OR;  Service: General;  Laterality: N/A;   JOINT REPLACEMENT     TOTAL HIP ARTHROPLASTY      Current Medications: Current Meds  Medication Sig   acetaminophen-codeine (TYLENOL #3) 300-30 MG tablet TAKE 1 TABLET EVERY 4 HOURS AS NEEDED FOR MODERATE PAIN.   albuterol (VENTOLIN HFA) 108 (90 Base) MCG/ACT inhaler INHALE 2 PUFFS INTO THE LUNGS EVERY 6 HOURS AS NEEDED FOR WHEEZING OR SHORTNESS OF BREATH.   amLODipine (NORVASC) 10 MG tablet Take 1 tablet (10 mg total) by mouth daily.   aspirin EC 81 MG tablet Take 81 mg by mouth daily.   atorvastatin (LIPITOR) 40 MG tablet Take 1 tablet (40 mg total) by mouth daily.   brimonidine (ALPHAGAN) 0.2 % ophthalmic solution Place 1 drop into the left eye daily.   carvedilol (COREG) 25 MG tablet TAKE 1 TABLET BY MOUTH TWICE DAILY WITH A MEAL (Patient taking differently: Take 25 mg by mouth 2 (two) times daily with a meal.)   diclofenac Sodium (VOLTAREN) 1 % GEL APPLY 2 GRAMS TOPICALLY 4 TIMES DAILY (Patient taking differently: Apply 2 g topically 4 (four) times daily as needed (Pain).)   dorzolamide (TRUSOPT) 2 % ophthalmic solution Place 1 drop into the left eye 3 (three) times daily.   fluticasone furoate-vilanterol (BREO ELLIPTA) 200-25 MCG/ACT AEPB Inhale 1 puff into the lungs daily.   latanoprost (XALATAN) 0.005 % ophthalmic solution Place 1 drop into the left eye daily.   losartan (COZAAR) 100 MG tablet TAKE 1 TABLET (100 MG TOTAL) BY MOUTH DAILY.   pantoprazole (PROTONIX) 40 MG tablet Take 1 tablet (40 mg total) by mouth daily.     Allergies:   Acyclovir and related, Metoprolol, and Food   Social History   Socioeconomic History   Marital status: Legally Separated    Spouse name: Not on file   Number of children: Not on file   Years of education: Not on file   Highest education level: Not on file  Occupational History    Occupation: not working  Tobacco Use   Smoking status: Former    Current packs/day: 0.00    Average packs/day: 0.2 packs/day for 0.5 years (0.1 ttl pk-yrs)    Types: Cigarettes    Start date: 03/11/1958    Quit date: 09/09/1958    Years since quitting: 64.0   Smokeless tobacco: Never   Tobacco comments:    social smoker  Vaping Use   Vaping status: Never Used  Substance and Sexual Activity   Alcohol use: No    Alcohol/week: 0.0 standard drinks of alcohol   Drug use: No   Sexual activity: Not Currently  Other Topics Concern   Not on file  Social History Narrative   Lives with daughter.    Social Determinants of Health   Financial Resource Strain: Low Risk  (06/15/2022)  Overall Financial Resource Strain (CARDIA)    Difficulty of Paying Living Expenses: Not hard at all  Food Insecurity: No Food Insecurity (06/15/2022)   Hunger Vital Sign    Worried About Running Out of Food in the Last Year: Never true    Ran Out of Food in the Last Year: Never true  Transportation Needs: No Transportation Needs (06/15/2022)   PRAPARE - Administrator, Civil Service (Medical): No    Lack of Transportation (Non-Medical): No  Physical Activity: Inactive (06/15/2022)   Exercise Vital Sign    Days of Exercise per Week: 0 days    Minutes of Exercise per Session: 0 min  Stress: Stress Concern Present (06/15/2022)   [employer] of Occupational Health - Occupational Stress Questionnaire    Feeling of Stress : To some extent  Social Connections: Moderately Integrated (06/15/2022)   Social Connection and Isolation Panel [NHANES]    Frequency of Communication with Friends and Family: More than three times a week    Frequency of Social Gatherings with Friends and Family: Once a week    Attends Religious Services: More than 4 times per year    Active Member of [employer] or Organizations: Yes    Attends Banker Meetings: 1 to 4 times per year    Marital Status: Widowed      Family History: The patient's family history includes Alzheimer's disease in her mother; Diabetes in her sister; Hypertension in her mother. There is no history of Colon cancer, Colon polyps, Esophageal cancer, Stomach cancer, or Rectal cancer.  ROS:   Please see the history of present illness.     All other systems reviewed and are negative.  EKGs/Labs/Other Studies Reviewed:    The following studies were reviewed today:  EKG:  09/20/2022: Normal sinus rhythm, rate 70, no ST abnormalities  Recent Labs: 04/27/2022: TSH 4.290 05/07/2022: B Natriuretic Peptide 189.0 05/08/2022: Hemoglobin 11.0; Platelets 197 06/07/2022: Magnesium 2.0 06/15/2022: BUN 19; Creatinine, Ser 0.96; Potassium 4.1; Sodium 144  Recent Lipid Panel    Component Value Date/Time   CHOL 119 05/19/2022 1136   TRIG 69 05/19/2022 1136   HDL 60 05/19/2022 1136   CHOLHDL 2.0 05/19/2022 1136   CHOLHDL 2.5 09/04/2014 1037   VLDL 12 09/04/2014 1037   LDLCALC 45 05/19/2022 1136    Physical Exam:    VS:  BP 110/62 (BP Location: Left Arm, Patient Position: Sitting, Cuff Size: Large)   Pulse 70   Ht 5\' 2"  (1.575 m)   Wt 227 lb 9.6 oz (103.2 kg)   SpO2 94%   BMI 41.63 kg/m     Wt Readings from Last 3 Encounters:  09/20/22 227 lb 9.6 oz (103.2 kg)  08/09/22 228 lb 3.2 oz (103.5 kg)  07/04/22 231 lb 12.8 oz (105.1 kg)     GEN:  Well nourished, well developed in no acute distress HEENT: Normal NECK: No JVD; No carotid bruits LYMPHATICS: No lymphadenopathy CARDIAC: RRR, 3 out of 6 systolic murmur RESPIRATORY:  Clear to auscultation without rales, wheezing or rhonchi  ABDOMEN: Soft, non-tender, non-distended MUSCULOSKELETAL: Trace lower extremity edema; No deformity  SKIN: Warm and dry NEUROLOGIC:  Alert and oriented x 3 PSYCHIATRIC:  Normal affect   ASSESSMENT:    1. Dyspnea on exertion   2. Essential hypertension   3. Aortic valve stenosis, etiology of cardiac valve disease unspecified   4. Chronic  diastolic heart failure (HCC)   5. Hyperlipidemia, unspecified hyperlipidemia type  PLAN:    Dyspnea: Reporting dyspnea with minimal exertion.  Likely multifactorial with asthma, ILD, HFpEF, and deconditioning contributing.  However does have significant CAD risk factors (age, hypertension, hyperlipidemia).  Recommend stress PET to rule out ischemia; this was previously scheduled for May but canceled due to lack of transportation.  Will reschedule  Aortic stenosis: Echocardiogram 08/03/2021 showed EF 65 to 70%, normal RV function, moderate aortic stenosis.  Echocardiogram 05/07/2022 showed EF 60 to 65%, normal RV function, moderate aortic stenosis.  Echo 08/2022 showed mild to moderate aortic stenosis. -Plan repeat echocardiogram in 1 year to follow  Chronic diastolic heart failure: She was admitted 05/2022 with acute on chronic diastolic heart failure.  She was diuresed with IV Lasix and discharged on p.o. Lasix 40 mg daily. -Mild lower extremity edema but otherwise appears euvolemic.  Weight down 5 pounds from prior clinic visit.  She previously was on Lasix 40 mg daily but stopped taking.  Recommend adding back Lasix 40 mg daily as needed; would check daily weights and take Lasix if gains more than 3 pounds in 1 day or 5 pounds in 1 week  Hypertension: On amlodipine 5 mg daily and Coreg 25 mg twice daily and losartan 100 mg daily.  Appears controlled  Hyperlipidemia: On atorvastatin 40 mg daily.  LDL 45 on 05/19/22  RTC in 4 months  Informed Consent   Shared Decision Making/Informed Consent The risks [chest pain, shortness of breath, cardiac arrhythmias, dizziness, blood pressure fluctuations, myocardial infarction, stroke/transient ischemic attack, nausea, vomiting, allergic reaction, radiation exposure, metallic taste sensation and life-threatening complications (estimated to be 1 in 10,000)], benefits (risk stratification, diagnosing coronary artery disease, treatment guidance) and  alternatives of a cardiac PET stress test were discussed in detail with [name] and she agrees to proceed.      Medication Adjustments/Labs and Tests Ordered: Current medicines are reviewed at length with the patient today.  Concerns regarding medicines are outlined above.  Orders Placed This Encounter  Procedures   EKG 12-Lead   No orders of the defined types were placed in this encounter.   There are no Patient Instructions on file for this visit.   Signed, Little Ishikawa, MD  09/20/2022 11:18 AM    [facility]

## 2022-09-20 ENCOUNTER — Other Ambulatory Visit: Payer: Self-pay

## 2022-09-20 ENCOUNTER — Encounter: Payer: Self-pay | Admitting: Cardiology

## 2022-09-20 ENCOUNTER — Ambulatory Visit: Payer: Medicare HMO | Attending: Cardiology | Admitting: Cardiology

## 2022-09-20 VITALS — BP 110/62 | HR 70 | Ht 62.0 in | Wt 227.6 lb

## 2022-09-20 DIAGNOSIS — I350 Nonrheumatic aortic (valve) stenosis: Secondary | ICD-10-CM

## 2022-09-20 DIAGNOSIS — I5032 Chronic diastolic (congestive) heart failure: Secondary | ICD-10-CM | POA: Diagnosis not present

## 2022-09-20 DIAGNOSIS — I10 Essential (primary) hypertension: Secondary | ICD-10-CM | POA: Diagnosis not present

## 2022-09-20 DIAGNOSIS — M5441 Lumbago with sciatica, right side: Secondary | ICD-10-CM

## 2022-09-20 DIAGNOSIS — E785 Hyperlipidemia, unspecified: Secondary | ICD-10-CM

## 2022-09-20 DIAGNOSIS — R0609 Other forms of dyspnea: Secondary | ICD-10-CM

## 2022-09-20 MED ORDER — ACETAMINOPHEN-CODEINE 300-30 MG PO TABS: ORAL_TABLET | ORAL | 0 refills | Status: DC

## 2022-09-20 MED ORDER — FUROSEMIDE 40 MG PO TABS: ORAL_TABLET | ORAL | 6 refills | Status: DC

## 2022-09-20 NOTE — Patient Instructions (Addendum)
Medication Instructions:   Lasix ( furosemide)  40 mg  one tablet daily  as needed  3 lbs overnight or 5 lbs in one week.  *If you need a refill on your cardiac medications before your next appointment, please call your pharmacy*   Lab Work: Not needed     Testing/Procedures:  Recommend you having a PET SCAN at [facility]  .A cardiac PET scan is an accurate, noninvasive test that creates images of your heart. A healthcare provider can use these images to make decisions about the next steps in your heart care. They can judge how healthy your heart muscle is and decide how to treat it. A cardiac PET scan uses a small amount of radiation    Follow-Up: At [facility], you and your health needs are our priority.  As part of our continuing mission to provide you with exceptional heart care, we have created designated Provider Care Teams.  These Care Teams include your primary Cardiologist (physician) and Advanced Practice Providers (APPs -  Physician Assistants and Nurse Practitioners) who all work together to provide you with the care you need, when you need it.  We recommend signing up for the patient portal called "MyChart".  Sign up information is provided on this After Visit Summary.  MyChart is used to connect with patients for Virtual Visits (Telemedicine).  Patients are able to view lab/test results, encounter notes, upcoming appointments, etc.  Non-urgent messages can be sent to your provider as well.   To learn more about what you can do with MyChart, go to [email or web address].    Your next appointment:   4 month(s)  The format for your next appointment:   In Person  Provider:   None    Other Instructions  How to Prepare for Your Cardiac PET/CT Stress Test:  1. Please do not take these medications before your test:   Carvedilol   Medications that may interfere with the cardiac pharmacological stress agent (ex. nitrates - including erectile dysfunction  medications, isosorbide mononitrate, tamulosin or beta-blockers) the day of the exam. (Erectile dysfunction medication should be held for at least 72 hrs prior to test) Theophylline containing medications for 12 hours. Dipyridamole 48 hours prior to the test. Your remaining medications may be taken with water.  2. Nothing to eat or drink, except water, 3 hours prior to arrival time.   NO caffeine/decaffeinated products, or chocolate 12 hours prior to arrival.  3. NO perfume, cologne or lotion  4. Total time is 1 to 2 hours; you may want to bring reading material for the waiting time.  5. Please report to Radiology at the [facility] Main Entrance 30 minutes early for your test.  [address] [place], [place] [address]     IF YOU THINK YOU MAY BE PREGNANT, OR ARE NURSING PLEASE INFORM THE TECHNOLOGIST.  In preparation for your appointment, medication and supplies will be purchased.  Appointment availability is limited, so if you need to cancel or reschedule, please call the Radiology Department at [phone] [facility]) OR [phone] [facility])  24 hours in advance to avoid a cancellation fee of $100.00  What to Expect After you Arrive:  Once you arrive and check in for your appointment, you will be taken to a preparation room within the Radiology Department.  A technologist or Nurse will obtain your medical history, verify that you are correctly prepped for the exam, and explain the procedure.  Afterwards,  an IV will  be started in your arm and electrodes will be placed on your skin for EKG monitoring during the stress portion of the exam. Then you will be escorted to the PET/CT scanner.  There, staff will get you positioned on the scanner and obtain a blood pressure and EKG.  During the exam, you will continue to be connected to the EKG and blood pressure machines.  A small, safe amount of a radioactive tracer will be injected in your IV to obtain a series of pictures of  your heart along with an injection of a stress agent.    After your Exam:  It is recommended that you eat a meal and drink a caffeinated beverage to counter act any effects of the stress agent.  Drink plenty of fluids for the remainder of the day and urinate frequently for the first couple of hours after the exam.  Your doctor will inform you of your test results within 7-10 business days.  For more information and frequently asked questions, please visit our website : [email or web address]  For questions about your test or how to prepare for your test, please call: Rockwell Alexandria, Cardiac Imaging Nurse Navigator  Larey Brick, Cardiac Imaging Nurse Navigator Johney Frame, Cardiac Imaging Nurse Navigator Office: [phone]

## 2022-10-20 ENCOUNTER — Other Ambulatory Visit: Payer: Self-pay

## 2022-10-20 DIAGNOSIS — M5441 Lumbago with sciatica, right side: Secondary | ICD-10-CM

## 2022-10-20 MED ORDER — ACETAMINOPHEN-CODEINE 300-30 MG PO TABS
ORAL_TABLET | ORAL | 0 refills | Status: DC
Start: 2022-10-20 — End: 2022-10-27

## 2022-10-22 ENCOUNTER — Other Ambulatory Visit: Payer: Self-pay | Admitting: Internal Medicine

## 2022-10-27 ENCOUNTER — Other Ambulatory Visit: Payer: Self-pay | Admitting: *Deleted

## 2022-10-27 DIAGNOSIS — M5441 Lumbago with sciatica, right side: Secondary | ICD-10-CM

## 2022-10-27 MED ORDER — ACETAMINOPHEN-CODEINE 300-30 MG PO TABS
ORAL_TABLET | ORAL | 0 refills | Status: AC
Start: 2022-10-27 — End: ?

## 2022-10-27 NOTE — Progress Notes (Deleted)
Patient called would like to get Tylenol #3 sent to the [facility] on [address] and not to the Mail order Pharmacy.

## 2022-10-30 ENCOUNTER — Other Ambulatory Visit: Payer: Self-pay | Admitting: Internal Medicine

## 2022-10-30 DIAGNOSIS — I10 Essential (primary) hypertension: Secondary | ICD-10-CM

## 2022-10-30 DIAGNOSIS — E7800 Pure hypercholesterolemia, unspecified: Secondary | ICD-10-CM

## 2022-10-30 DIAGNOSIS — E039 Hypothyroidism, unspecified: Secondary | ICD-10-CM

## 2022-10-31 ENCOUNTER — Other Ambulatory Visit: Payer: Self-pay | Admitting: Internal Medicine

## 2022-10-31 DIAGNOSIS — E7800 Pure hypercholesterolemia, unspecified: Secondary | ICD-10-CM

## 2022-10-31 DIAGNOSIS — I10 Essential (primary) hypertension: Secondary | ICD-10-CM

## 2022-10-31 MED ORDER — PANTOPRAZOLE SODIUM 40 MG PO TBEC: 40.0000 mg | DELAYED_RELEASE_TABLET | Freq: Every day | ORAL | 3 refills | Status: DC

## 2022-10-31 MED ORDER — ATORVASTATIN CALCIUM 40 MG PO TABS
40.0000 mg | ORAL_TABLET | Freq: Every day | ORAL | 3 refills | Status: DC
Start: 2022-10-31 — End: 2023-04-04

## 2022-10-31 MED ORDER — CARVEDILOL 25 MG PO TABS
ORAL_TABLET | ORAL | 3 refills | Status: AC
Start: 2022-10-31 — End: ?

## 2022-11-10 DIAGNOSIS — H4052X3 Glaucoma secondary to other eye disorders, left eye, severe stage: Secondary | ICD-10-CM | POA: Diagnosis not present

## 2022-11-10 DIAGNOSIS — H26492 Other secondary cataract, left eye: Secondary | ICD-10-CM | POA: Diagnosis not present

## 2022-11-10 DIAGNOSIS — Z961 Presence of intraocular lens: Secondary | ICD-10-CM | POA: Diagnosis not present

## 2022-11-10 DIAGNOSIS — H34832 Tributary (branch) retinal vein occlusion, left eye, with macular edema: Secondary | ICD-10-CM | POA: Diagnosis not present

## 2022-11-10 DIAGNOSIS — H25811 Combined forms of age-related cataract, right eye: Secondary | ICD-10-CM | POA: Diagnosis not present

## 2022-11-10 DIAGNOSIS — H3582 Retinal ischemia: Secondary | ICD-10-CM | POA: Diagnosis not present

## 2022-11-22 ENCOUNTER — Telehealth (HOSPITAL_COMMUNITY): Payer: Self-pay | Admitting: *Deleted

## 2022-11-22 NOTE — Telephone Encounter (Signed)
Reaching out to patient to offer assistance regarding upcoming cardiac imaging study; pt verbalizes understanding of appt date/time, parking situation and where to check in, pre-test NPO status, and verified current allergies; name and call back number provided for further questions should they arise  Natalie Brick RN Navigator Cardiac Imaging [facility] [phone] office [phone] cell  Patient aware to avoid caffeine 12 hours prior to her cardiac PET scan.

## 2022-11-23 ENCOUNTER — Encounter (HOSPITAL_COMMUNITY)
Admission: RE | Admit: 2022-11-23 | Discharge: 2022-11-23 | Disposition: A | Discharge status: Home / Self Care | Payer: Medicare HMO | Source: Ambulatory Visit | Attending: Cardiology | Admitting: Cardiology

## 2022-11-23 DIAGNOSIS — R0609 Other forms of dyspnea: Secondary | ICD-10-CM | POA: Insufficient documentation

## 2022-11-23 DIAGNOSIS — I5032 Chronic diastolic (congestive) heart failure: Secondary | ICD-10-CM | POA: Insufficient documentation

## 2022-11-23 LAB — NM PET CT CARDIAC PERFUSION MULTI W/ABSOLUTE BLOODFLOW
LV dias vol: 81 mL (ref 46–106)
LV sys vol: 22 mL
MBFR: 1.37
Nuc Rest EF: 73 %
Nuc Stress EF: 73 %
Peak HR: 88 {beats}/min
Rest HR: 71 {beats}/min
Rest MBF: 1.76 ml/g/min
Rest Nuclear Isotope Dose: 26.6 mCi
ST Depression (mm): 0 mm
Stress MBF: 2.41 ml/g/min
Stress Nuclear Isotope Dose: 27.2 mCi

## 2022-11-23 MED ORDER — RUBIDIUM RB82 GENERATOR (RUBYFILL)
27.2300 | PACK | Freq: Once | INTRAVENOUS | Status: AC
  Administered 2022-11-23: 27.23 via INTRAVENOUS

## 2022-11-23 MED ORDER — REGADENOSON 0.4 MG/5ML IV SOLN
INTRAVENOUS | Status: AC
  Filled 2022-11-23: qty 5

## 2022-11-23 MED ORDER — RUBIDIUM RB82 GENERATOR (RUBYFILL)
26.6000 | PACK | Freq: Once | INTRAVENOUS | Status: AC
  Administered 2022-11-23: 26.6 via INTRAVENOUS

## 2022-11-23 MED ORDER — REGADENOSON 0.4 MG/5ML IV SOLN
0.4000 mg | Freq: Once | INTRAVENOUS | Status: AC
  Administered 2022-11-23: 0.4 mg via INTRAVENOUS

## 2022-11-27 ENCOUNTER — Other Ambulatory Visit: Payer: Self-pay | Admitting: Internal Medicine

## 2022-11-27 DIAGNOSIS — I10 Essential (primary) hypertension: Secondary | ICD-10-CM

## 2022-12-05 ENCOUNTER — Other Ambulatory Visit: Payer: Self-pay

## 2022-12-05 DIAGNOSIS — M5441 Lumbago with sciatica, right side: Secondary | ICD-10-CM

## 2022-12-05 MED ORDER — ACETAMINOPHEN-CODEINE 300-30 MG PO TABS
ORAL_TABLET | ORAL | 0 refills | Status: DC
Start: 2022-12-05 — End: 2023-01-16

## 2022-12-06 ENCOUNTER — Encounter: Payer: Self-pay | Admitting: Primary Care

## 2022-12-15 DIAGNOSIS — H26492 Other secondary cataract, left eye: Secondary | ICD-10-CM | POA: Diagnosis not present

## 2022-12-26 ENCOUNTER — Other Ambulatory Visit: Payer: Medicare HMO

## 2023-01-16 ENCOUNTER — Other Ambulatory Visit: Payer: Self-pay | Admitting: Internal Medicine

## 2023-01-16 DIAGNOSIS — M5441 Lumbago with sciatica, right side: Secondary | ICD-10-CM

## 2023-01-16 NOTE — Telephone Encounter (Signed)
acetaminophen-codeine (TYLENOL #3) 300-30 MG tablet    [id number] - [place], [place] - [address] AT [address]

## 2023-01-16 NOTE — Telephone Encounter (Signed)
Last rx written - 12/05/22. Last OV - 08/09/22. Next OV - has not been scheduled.

## 2023-01-17 MED ORDER — ACETAMINOPHEN-CODEINE 300-30 MG PO TABS
ORAL_TABLET | ORAL | 0 refills | Status: DC
Start: 2023-01-17 — End: 2023-02-10

## 2023-01-22 NOTE — Progress Notes (Unsigned)
Cardiology Office Note:    Date:  01/25/2023   ID:  Benjaman Pott, DOB [birth year], MRN [id number]  PCP:  Mercie Eon, MD  Cardiologist:  Little Ishikawa, MD  Electrophysiologist:  None   Referring MD: Mercie Eon, MD   Chief Complaint  Patient presents with   Hypertension    History of Present Illness:    [name] is a 87 y.o. female with a hx of asthma, breast cancer, hypertension, hyperlipidemia, obesity who presents for follow-up.  She was referred by Dr. Heide Spark for evaluation of aortic stenosis, initially seen 09/02/2021.  She denies any chest pain, lightheadedness, syncope, or palpitations.  Does report she gets short of breath with minimal exertion.  Does improve with inhaler use.  Reports swelling in her feet, uses compression stockings.  Smoked in her 1s.  Family history includes both her daughters had strokes.  Echocardiogram 08/03/2021 showed EF 65 to 70%, normal RV function, moderate aortic stenosis.    She was admitted 05/2022 with acute on chronic diastolic heart failure.  She was diuresed with IV Lasix and discharged on p.o. Lasix 40 mg daily.  Also treated for possible ILD flare with prednisone.  Echocardiogram 05/07/2022 showed EF 60 to 65%, normal RV function, moderate aortic stenosis.  Echocardiogram 08/2022 showed EF 60 to 65%, RV not well-visualized, small pericardial effusion, mild mitral regurgitation, mild to moderate aortic stenosis.  Stress PET 11/23/2022 showed normal perfusion, EF 73%, normal myocardial stress flows (though low myocardial blood flow reserve due to high resting flows), moderate coronary calcifications.  Since last clinic visit, reports she is doing okay.  She continues to have shortness of breath.  Has chest pain when she eats at times, resolves with Tums.  Weight is down 7 pounds.  She has been taking Lasix when notices swelling in her legs.  She denies any lightheadedness or syncope.  Denies any palpitations.  BP Readings from Last 3  Encounters:  01/25/23 (!) 190/82  11/23/22 (!) 114/52  09/20/22 110/62    Wt Readings from Last 3 Encounters:  01/25/23 220 lb (99.8 kg)  09/20/22 227 lb 9.6 oz (103.2 kg)  08/09/22 228 lb 3.2 oz (103.5 kg)     Past Medical History:  Diagnosis Date   Allergic rhinitis, cause unspecified    Asthma    B12 deficiency 06/15/2022   Cancer (HCC)    CHF exacerbation (HCC) 05/08/2022   Chronic back pain    Coronary artery calcification seen on CAT scan 04/21/2017   Enlarged pulmonary artery (HCC) 04/21/2017   By chest CT   Frequent urination 11/14/2017   GERD (gastroesophageal reflux disease) 05/19/2011   history of Bilateral leg edema-likely amlodipine induced. 07/28/2011   history of CAP (community acquired pneumonia) 07/14/2011   history of HYPOTHYROIDISM, BORDERLINE 03/20/2006   Annotation: Asymptomatic, untreated Qualifier: Diagnosis of  By: Candis Musa MD, Ruben Reason.    Hx of breast cancer    Hyperlipidemia    Hypertension    Hypothyroidism    Hypothyroidism 03/20/2006   Annotation: Asymptomatic, untreated  Qualifier: Diagnosis of   By: Candis Musa MD, Ruben Reason.      Lumbago    Murmur, cardiac 11/22/2016   OBESITY NOS 03/20/2006   Qualifier: Diagnosis of  By: Candis Musa MD, Veronique D.    OSTEOARTHRITIS 12/13/2005   Annotation: bilateral knees;right knee injection 6/05 Qualifier: Diagnosis of  By: Candis Musa MD, Ruben Reason.    Pain, dental 09/18/2020   Preventative health care 09/04/2014   Pulmonary nodule 05/30/2017  4 mm right middle lobe nodule noted on high-resolution CT done on February 06, 2017   right shoulder pain, likely Impingement syndrome  09/09/2010   Upper respiratory infection 04/09/2018   VENOUS INSUFFICIENCY, CHRONIC 03/20/2006   Qualifier: Diagnosis of  By: Candis Musa MD, Ruben Reason.     Past Surgical History:  Procedure Laterality Date   ABDOMINAL HYSTERECTOMY     BREAST LUMPECTOMY     COLONOSCOPY     HERNIA REPAIR     HIP CLOSED REDUCTION Right  11/26/2014   Procedure: CLOSED REDUCTIION RIGHT HIP;  Surgeon: Nadara Mustard, MD;  Location: [facility] OR;  Service: Orthopedics;  Laterality: Right;   INCISIONAL HERNIA REPAIR N/A 10/29/2013   Procedure: OPEN REPAIR OF RECURRENT INCISIONAL HERNIA WITH MESH;  Surgeon: Liz Malady, MD;  Location: [facility] OR;  Service: General;  Laterality: N/A;   JOINT REPLACEMENT     TOTAL HIP ARTHROPLASTY      Current Medications: Current Meds  Medication Sig   acetaminophen-codeine (TYLENOL #3) 300-30 MG tablet TAKE 1 TABLET EVERY 4 HOURS AS NEEDED FOR MODERATE PAIN.   albuterol (VENTOLIN HFA) 108 (90 Base) MCG/ACT inhaler INHALE 2 PUFFS INTO THE LUNGS EVERY 6 HOURS AS NEEDED FOR WHEEZING OR SHORTNESS OF BREATH.   amLODipine (NORVASC) 10 MG tablet Take 1 tablet (10 mg total) by mouth daily.   aspirin EC 81 MG tablet Take 81 mg by mouth daily.   atorvastatin (LIPITOR) 40 MG tablet Take 1 tablet (40 mg total) by mouth daily.   brimonidine (ALPHAGAN) 0.2 % ophthalmic solution Place 1 drop into the left eye daily.   carvedilol (COREG) 25 MG tablet TAKE 1 TABLET BY MOUTH TWICE DAILY WITH A MEAL   chlorthalidone (HYGROTON) 25 MG tablet Take 0.5 tablets (12.5 mg total) by mouth daily.   diclofenac Sodium (VOLTAREN) 1 % GEL APPLY 2 GRAMS TOPICALLY 4 TIMES DAILY (Patient taking differently: Apply 2 g topically 4 (four) times daily as needed (Pain).)   dorzolamide (TRUSOPT) 2 % ophthalmic solution Place 1 drop into the left eye 3 (three) times daily.   fluticasone furoate-vilanterol (BREO ELLIPTA) 200-25 MCG/ACT AEPB Inhale 1 puff into the lungs daily.   furosemide (LASIX) 40 MG tablet Take 40 mg  tablet by mouth  as needed  daily ,for weight gain of 3 lbs overnight or 5 lbs in a week   latanoprost (XALATAN) 0.005 % ophthalmic solution Place 1 drop into the left eye daily.   losartan (COZAAR) 100 MG tablet TAKE 1 TABLET EVERY DAY   pantoprazole (PROTONIX) 40 MG tablet Take 1 tablet (40 mg total) by mouth daily.      Allergies:   Acyclovir and related, Metoprolol, and Food   Social History   Socioeconomic History   Marital status: Widowed    Spouse name: Not on file   Number of children: Not on file   Years of education: Not on file   Highest education level: Not on file  Occupational History   Occupation: not working  Tobacco Use   Smoking status: Former    Current packs/day: 0.00    Average packs/day: 0.2 packs/day for 0.5 years (0.1 ttl pk-yrs)    Types: Cigarettes    Start date: 03/11/1958    Quit date: 09/09/1958    Years since quitting: 64.4   Smokeless tobacco: Never   Tobacco comments:    social smoker  Vaping Use   Vaping status: Never Used  Substance and Sexual Activity   Alcohol use:  No    Alcohol/week: 0.0 standard drinks of alcohol   Drug use: No   Sexual activity: Not Currently  Other Topics Concern   Not on file  Social History Narrative   Lives with daughter.    Social Determinants of Health   Financial Resource Strain: Low Risk  (06/15/2022)   Overall Financial Resource Strain (CARDIA)    Difficulty of Paying Living Expenses: Not hard at all  Food Insecurity: No Food Insecurity (06/15/2022)   Hunger Vital Sign    Worried About Running Out of Food in the Last Year: Never true    Ran Out of Food in the Last Year: Never true  Transportation Needs: No Transportation Needs (06/15/2022)   PRAPARE - Administrator, Civil Service (Medical): No    Lack of Transportation (Non-Medical): No  Physical Activity: Inactive (06/15/2022)   Exercise Vital Sign    Days of Exercise per Week: 0 days    Minutes of Exercise per Session: 0 min  Stress: Stress Concern Present (06/15/2022)   [employer] of Occupational Health - Occupational Stress Questionnaire    Feeling of Stress : To some extent  Social Connections: Moderately Integrated (06/15/2022)   Social Connection and Isolation Panel [NHANES]    Frequency of Communication with Friends and Family: More than three  times a week    Frequency of Social Gatherings with Friends and Family: Once a week    Attends Religious Services: More than 4 times per year    Active Member of [employer] or Organizations: Yes    Attends Banker Meetings: 1 to 4 times per year    Marital Status: Widowed     Family History: The patient's family history includes Alzheimer's disease in her mother; Diabetes in her sister; Hypertension in her mother. There is no history of Colon cancer, Colon polyps, Esophageal cancer, Stomach cancer, or Rectal cancer.  ROS:   Please see the history of present illness.     All other systems reviewed and are negative.  EKGs/Labs/Other Studies Reviewed:    The following studies were reviewed today:  EKG:  09/20/2022: Normal sinus rhythm, rate 70, no ST abnormalities  Recent Labs: 04/27/2022: TSH 4.290 05/07/2022: B Natriuretic Peptide 189.0 05/08/2022: Hemoglobin 11.0; Platelets 197 06/07/2022: Magnesium 2.0 06/15/2022: BUN 19; Creatinine, Ser 0.96; Potassium 4.1; Sodium 144  Recent Lipid Panel    Component Value Date/Time   CHOL 119 05/19/2022 1136   TRIG 69 05/19/2022 1136   HDL 60 05/19/2022 1136   CHOLHDL 2.0 05/19/2022 1136   CHOLHDL 2.5 09/04/2014 1037   VLDL 12 09/04/2014 1037   LDLCALC 45 05/19/2022 1136    Physical Exam:    VS:  BP (!) 190/82 (BP Location: Left Arm, Patient Position: Sitting, Cuff Size: Large)   Pulse 77   Ht 5\' 2"  (1.575 m)   Wt 220 lb (99.8 kg)   SpO2 98%   BMI 40.24 kg/m     Wt Readings from Last 3 Encounters:  01/25/23 220 lb (99.8 kg)  09/20/22 227 lb 9.6 oz (103.2 kg)  08/09/22 228 lb 3.2 oz (103.5 kg)     GEN:  Well nourished, well developed in no acute distress HEENT: Normal NECK: No JVD; No carotid bruits CARDIAC: RRR, 3 out of 6 systolic murmur RESPIRATORY:  Clear to auscultation without rales, wheezing or rhonchi  ABDOMEN: Soft, non-tender, non-distended MUSCULOSKELETAL: 1+ right lower extremity edema; No deformity  SKIN:  Warm and dry NEUROLOGIC:  Alert and oriented  x 3 PSYCHIATRIC:  Normal affect   ASSESSMENT:    1. Essential hypertension   2. Dyspnea on exertion   3. Chronic diastolic heart failure (HCC)   4. Aortic valve stenosis, etiology of cardiac valve disease unspecified   5. Hyperlipidemia, unspecified hyperlipidemia type      PLAN:    Dyspnea: Reporting dyspnea with minimal exertion.  Likely multifactorial with asthma, ILD, HFpEF, and deconditioning contributing.  Stress PET 11/23/2022 showed normal perfusion, EF 73%, normal myocardial stress flows (though low myocardial blood flow reserve due to high resting flows), moderate coronary calcifications.  Aortic stenosis: Echocardiogram 08/03/2021 showed EF 65 to 70%, normal RV function, moderate aortic stenosis.  Echocardiogram 05/07/2022 showed EF 60 to 65%, normal RV function, moderate aortic stenosis.  Echo 08/2022 showed mild to moderate aortic stenosis. -Plan repeat echocardiogram in 1 year to follow  Chronic diastolic heart failure: She was admitted 05/2022 with acute on chronic diastolic heart failure.  She was diuresed with IV Lasix and discharged on p.o. Lasix 40 mg daily. -Continue Lasix 40 mg daily as needed  Hypertension: On amlodipine 10 mg daily and Coreg 25 mg twice daily and losartan 100 mg daily.  BP significantly elevated in clinic today.  Reports compliance with her medications.  Recommend adding chlorthalidone 12.5 mg daily.  Check BMET, magnesium.  Recommend follow-up in pharmacy hypertension clinic in 2 weeks.  Asked patient to check BP daily for next 2 weeks and bring log and home BP monitor to calibrate to appointment with pharmacy.  Suspect untreated OSA contributing to resistant hypertension, recommend compliance with CPAP  Hyperlipidemia: On atorvastatin 40 mg daily.  LDL 45 on 05/19/22  OSA: on CPAP, reports only using about once per week, encouraged to use every day.  RTC in 3 months    Medication Adjustments/Labs and  Tests Ordered: Current medicines are reviewed at length with the patient today.  Concerns regarding medicines are outlined above.  Orders Placed This Encounter  Procedures   Comprehensive Metabolic Panel (CMET)   Magnesium   AMB Referral to Heartcare Pharm-D   Meds ordered this encounter  Medications   chlorthalidone (HYGROTON) 25 MG tablet    Sig: Take 0.5 tablets (12.5 mg total) by mouth daily.    Dispense:  90 tablet    Refill:  3    Patient Instructions  Medication Instructions:  Start Chlorthalidone 12.5 mg daily  Continue all current medication *If you need a refill on your cardiac medications before your next appointment, please call your pharmacy*   Lab Work: CMET, Mg today If you have labs (blood work) drawn today and your tests are completely normal, you will receive your results only by: MyChart Message (if you have MyChart) OR A paper copy in the mail If you have any lab test that is abnormal or we need to change your treatment, we will call you to review the results.   Testing/Procedures: none   Follow-Up: At [facility], you and your health needs are our priority.  As part of our continuing mission to provide you with exceptional heart care, we have created designated Provider Care Teams.  These Care Teams include your primary Cardiologist (physician) and Advanced Practice Providers (APPs -  Physician Assistants and Nurse Practitioners) who all work together to provide you with the care you need, when you need it.  We recommend signing up for the patient portal called "MyChart".  Sign up information is provided on this After Visit Summary.  MyChart is used  to connect with patients for Virtual Visits (Telemedicine).  Patients are able to view lab/test results, encounter notes, upcoming appointments, etc.  Non-urgent messages can be sent to your provider as well.   To learn more about what you can do with MyChart, go to [email or web address].    Your  next appointment:   3 month(s)  Provider:   Dr. Bjorn Pippin  Other Instructions Referral to Pharm D  Please check blood pressure once a day for 2 weeks and bring a log to your Pharm D referral appt. Please bring your home Blood pressure cuff to be calibrated for this visit also     Signed, Little Ishikawa, MD  01/25/2023 12:40 PM    [facility]

## 2023-01-25 ENCOUNTER — Ambulatory Visit: Payer: Medicare HMO | Attending: Cardiology | Admitting: Cardiology

## 2023-01-25 ENCOUNTER — Encounter: Payer: Self-pay | Admitting: Cardiology

## 2023-01-25 VITALS — BP 190/82 | HR 77 | Ht 62.0 in | Wt 220.0 lb

## 2023-01-25 DIAGNOSIS — R0609 Other forms of dyspnea: Secondary | ICD-10-CM | POA: Diagnosis not present

## 2023-01-25 DIAGNOSIS — I10 Essential (primary) hypertension: Secondary | ICD-10-CM | POA: Diagnosis not present

## 2023-01-25 DIAGNOSIS — E785 Hyperlipidemia, unspecified: Secondary | ICD-10-CM | POA: Diagnosis not present

## 2023-01-25 DIAGNOSIS — I5032 Chronic diastolic (congestive) heart failure: Secondary | ICD-10-CM | POA: Diagnosis not present

## 2023-01-25 DIAGNOSIS — I350 Nonrheumatic aortic (valve) stenosis: Secondary | ICD-10-CM | POA: Diagnosis not present

## 2023-01-25 MED ORDER — CHLORTHALIDONE 25 MG PO TABS: 12.5000 mg | ORAL_TABLET | Freq: Every day | ORAL | 3 refills | Status: DC

## 2023-01-25 NOTE — Patient Instructions (Signed)
Medication Instructions:  Start Chlorthalidone 12.5 mg daily  Continue all current medication *If you need a refill on your cardiac medications before your next appointment, please call your pharmacy*   Lab Work: CMET, Mg today If you have labs (blood work) drawn today and your tests are completely normal, you will receive your results only by: MyChart Message (if you have MyChart) OR A paper copy in the mail If you have any lab test that is abnormal or we need to change your treatment, we will call you to review the results.   Testing/Procedures: none   Follow-Up: At [facility], you and your health needs are our priority.  As part of our continuing mission to provide you with exceptional heart care, we have created designated Provider Care Teams.  These Care Teams include your primary Cardiologist (physician) and Advanced Practice Providers (APPs -  Physician Assistants and Nurse Practitioners) who all work together to provide you with the care you need, when you need it.  We recommend signing up for the patient portal called "MyChart".  Sign up information is provided on this After Visit Summary.  MyChart is used to connect with patients for Virtual Visits (Telemedicine).  Patients are able to view lab/test results, encounter notes, upcoming appointments, etc.  Non-urgent messages can be sent to your provider as well.   To learn more about what you can do with MyChart, go to [email or web address].    Your next appointment:   3 month(s)  Provider:   Dr. Bjorn Pippin  Other Instructions Referral to Pharm D  Please check blood pressure once a day for 2 weeks and bring a log to your Pharm D referral appt. Please bring your home Blood pressure cuff to be calibrated for this visit also

## 2023-01-26 ENCOUNTER — Other Ambulatory Visit: Payer: Self-pay

## 2023-01-26 DIAGNOSIS — E876 Hypokalemia: Secondary | ICD-10-CM

## 2023-01-26 LAB — COMPREHENSIVE METABOLIC PANEL
ALT: 6 [IU]/L (ref 0–32)
AST: 15 [IU]/L (ref 0–40)
Albumin: 3.7 g/dL (ref 3.7–4.7)
Alkaline Phosphatase: 54 [IU]/L (ref 44–121)
BUN/Creatinine Ratio: 16 (ref 12–28)
BUN: 13 mg/dL (ref 8–27)
Bilirubin Total: 0.5 mg/dL (ref 0.0–1.2)
CO2: 29 mmol/L (ref 20–29)
Calcium: 9.5 mg/dL (ref 8.7–10.3)
Chloride: 101 mmol/L (ref 96–106)
Creatinine, Ser: 0.82 mg/dL (ref 0.57–1.00)
Globulin, Total: 2.8 g/dL (ref 1.5–4.5)
Glucose: 100 mg/dL — ABNORMAL HIGH (ref 70–99)
Potassium: 3.4 mmol/L — ABNORMAL LOW (ref 3.5–5.2)
Sodium: 143 mmol/L (ref 134–144)
Total Protein: 6.5 g/dL (ref 6.0–8.5)
eGFR: 70 mL/min/{1.73_m2} (ref >59)

## 2023-01-26 LAB — MAGNESIUM: Magnesium: 1.6 mg/dL (ref 1.6–2.3)

## 2023-01-26 MED ORDER — MAGNESIUM OXIDE 400 MG PO CAPS: ORAL_CAPSULE | ORAL | 3 refills | Status: DC

## 2023-01-26 MED ORDER — POTASSIUM CHLORIDE CRYS ER 20 MEQ PO TBCR: 20.0000 meq | EXTENDED_RELEASE_TABLET | Freq: Every day | ORAL | 3 refills | Status: DC

## 2023-02-09 ENCOUNTER — Encounter: Payer: Self-pay | Admitting: Internal Medicine

## 2023-02-09 ENCOUNTER — Ambulatory Visit: Payer: Medicare HMO | Admitting: Internal Medicine

## 2023-02-09 VITALS — BP 160/85 | HR 80 | Temp 98.3°F | Ht 62.0 in | Wt 211.2 lb

## 2023-02-09 DIAGNOSIS — I10 Essential (primary) hypertension: Secondary | ICD-10-CM

## 2023-02-09 DIAGNOSIS — Z23 Encounter for immunization: Secondary | ICD-10-CM | POA: Diagnosis not present

## 2023-02-09 DIAGNOSIS — I5032 Chronic diastolic (congestive) heart failure: Secondary | ICD-10-CM | POA: Diagnosis not present

## 2023-02-09 DIAGNOSIS — E039 Hypothyroidism, unspecified: Secondary | ICD-10-CM

## 2023-02-09 DIAGNOSIS — Z0000 Encounter for general adult medical examination without abnormal findings: Secondary | ICD-10-CM

## 2023-02-09 DIAGNOSIS — M5441 Lumbago with sciatica, right side: Secondary | ICD-10-CM

## 2023-02-09 MED ORDER — AMLODIPINE-ATORVASTATIN 10-40 MG PO TABS: 1.0000 | ORAL_TABLET | Freq: Every day | ORAL | 3 refills | Status: DC

## 2023-02-09 NOTE — Assessment & Plan Note (Addendum)
-  Patient has been taking lasix 40 mg daily as she did not know it was PRN. She has no LE edema and does not need to take it daily, which she is now aware of. Of note, patient's cardiologist sent her K and Mg supplementation due to low levels. We are rechecking a BMP today.   -Not on SGLT2i due to pill burden

## 2023-02-09 NOTE — Assessment & Plan Note (Signed)
Flu shot given today

## 2023-02-09 NOTE — Assessment & Plan Note (Signed)
Presents for a 6 month follow up of her BP. BP today is elevated to 160/85 (was 190/82 at her cardiology visit 2 weeks ago). She is on coreg 25 mg bid, losartan 100 mg daily, and has been out of her amlodipine 10 mg daily for a few months. Her cardiologist started her on chlorthalidone 12.5 mg daily, but she has not yet started this medicine as it was sent to her mail order pharmacy.   Plan: - Continue losartan, coreg - Start Caduet (atorva-amlodipine) to reduce pill burden - Start chlorthalidone 12.5 mg daily when it comes in the mail - Follow up in 2 weeks for BP recheck (advised to bring all her meds again)

## 2023-02-09 NOTE — Progress Notes (Signed)
CC: follow up   HPI:  [name] is a 87 y.o. female living with a history stated below and presents today for a 6 month follow up of her HTN. Please see problem based assessment and plan for additional details.  Past Medical History:  Diagnosis Date   Allergic rhinitis, cause unspecified    Asthma    B12 deficiency 06/15/2022   Cancer (HCC)    CHF exacerbation (HCC) 05/08/2022   Chronic back pain    Coronary artery calcification seen on CAT scan 04/21/2017   Enlarged pulmonary artery (HCC) 04/21/2017   By chest CT   Frequent urination 11/14/2017   GERD (gastroesophageal reflux disease) 05/19/2011   history of Bilateral leg edema-likely amlodipine induced. 07/28/2011   history of CAP (community acquired pneumonia) 07/14/2011   history of HYPOTHYROIDISM, BORDERLINE 03/20/2006   Annotation: Asymptomatic, untreated Qualifier: Diagnosis of  By: Candis Musa MD, Ruben Reason.    Hx of breast cancer    Hyperlipidemia    Hypertension    Hypothyroidism    Hypothyroidism 03/20/2006   Annotation: Asymptomatic, untreated  Qualifier: Diagnosis of   By: Candis Musa MD, Ruben Reason.      Lumbago    Murmur, cardiac 11/22/2016   OBESITY NOS 03/20/2006   Qualifier: Diagnosis of  By: Candis Musa MD, Veronique D.    OSTEOARTHRITIS 12/13/2005   Annotation: bilateral knees;right knee injection 6/05 Qualifier: Diagnosis of  By: Candis Musa MD, Ruben Reason.    Pain, dental 09/18/2020   Preventative health care 09/04/2014   Pulmonary nodule 05/30/2017   4 mm right middle lobe nodule noted on high-resolution CT done on February 06, 2017   right shoulder pain, likely Impingement syndrome  09/09/2010   Upper respiratory infection 04/09/2018   VENOUS INSUFFICIENCY, CHRONIC 03/20/2006   Qualifier: Diagnosis of  By: Candis Musa MD, Ruben Reason.     Current Outpatient Medications on File Prior to Visit  Medication Sig Dispense Refill   acetaminophen-codeine (TYLENOL #3) 300-30 MG tablet TAKE 1 TABLET EVERY 4  HOURS AS NEEDED FOR MODERATE PAIN. 30 tablet 0   albuterol (VENTOLIN HFA) 108 (90 Base) MCG/ACT inhaler INHALE 2 PUFFS INTO THE LUNGS EVERY 6 HOURS AS NEEDED FOR WHEEZING OR SHORTNESS OF BREATH. 1 each 3   amLODipine (NORVASC) 10 MG tablet Take 1 tablet (10 mg total) by mouth daily. 90 tablet 3   aspirin EC 81 MG tablet Take 81 mg by mouth daily.     atorvastatin (LIPITOR) 40 MG tablet Take 1 tablet (40 mg total) by mouth daily. 90 tablet 3   brimonidine (ALPHAGAN) 0.2 % ophthalmic solution Place 1 drop into the left eye daily.     carvedilol (COREG) 25 MG tablet TAKE 1 TABLET BY MOUTH TWICE DAILY WITH A MEAL 180 tablet 3   chlorthalidone (HYGROTON) 25 MG tablet Take 0.5 tablets (12.5 mg total) by mouth daily. 90 tablet 3   diclofenac Sodium (VOLTAREN) 1 % GEL APPLY 2 GRAMS TOPICALLY 4 TIMES DAILY (Patient taking differently: Apply 2 g topically 4 (four) times daily as needed (Pain).) 700 g 10   dorzolamide (TRUSOPT) 2 % ophthalmic solution Place 1 drop into the left eye 3 (three) times daily. 10 mL 3   fluticasone furoate-vilanterol (BREO ELLIPTA) 200-25 MCG/ACT AEPB Inhale 1 puff into the lungs daily. 180 each 3   furosemide (LASIX) 40 MG tablet Take 40 mg  tablet by mouth  as needed  daily ,for weight gain of 3 lbs overnight or 5 lbs in a week  30 tablet 6   latanoprost (XALATAN) 0.005 % ophthalmic solution Place 1 drop into the left eye daily.     losartan (COZAAR) 100 MG tablet TAKE 1 TABLET EVERY DAY 90 tablet 3   Magnesium Oxide 400 MG CAPS Take 400 mg daily 90 capsule 3   pantoprazole (PROTONIX) 40 MG tablet Take 1 tablet (40 mg total) by mouth daily. 90 tablet 3   potassium chloride SA (KLOR-CON M) 20 MEQ tablet Take 1 tablet (20 mEq total) by mouth daily. 90 tablet 3   No current facility-administered medications on file prior to visit.    Family History  Problem Relation Age of Onset   Hypertension Mother    Alzheimer's disease Mother    Diabetes Sister    Colon cancer Neg Hx     Colon polyps Neg Hx    Esophageal cancer Neg Hx    Stomach cancer Neg Hx    Rectal cancer Neg Hx     Social History   Socioeconomic History   Marital status: Widowed    Spouse name: Not on file   Number of children: Not on file   Years of education: Not on file   Highest education level: Not on file  Occupational History   Occupation: not working  Tobacco Use   Smoking status: Former    Current packs/day: 0.00    Average packs/day: 0.2 packs/day for 0.5 years (0.1 ttl pk-yrs)    Types: Cigarettes    Start date: 03/11/1958    Quit date: 09/09/1958    Years since quitting: 64.4   Smokeless tobacco: Never   Tobacco comments:    social smoker  Vaping Use   Vaping status: Never Used  Substance and Sexual Activity   Alcohol use: No    Alcohol/week: 0.0 standard drinks of alcohol   Drug use: No   Sexual activity: Not Currently  Other Topics Concern   Not on file  Social History Narrative   Lives with daughter.    Social Determinants of Health   Financial Resource Strain: Low Risk  (06/15/2022)   Overall Financial Resource Strain (CARDIA)    Difficulty of Paying Living Expenses: Not hard at all  Food Insecurity: No Food Insecurity (06/15/2022)   Hunger Vital Sign    Worried About Running Out of Food in the Last Year: Never true    Ran Out of Food in the Last Year: Never true  Transportation Needs: No Transportation Needs (06/15/2022)   PRAPARE - Administrator, Civil Service (Medical): No    Lack of Transportation (Non-Medical): No  Physical Activity: Inactive (06/15/2022)   Exercise Vital Sign    Days of Exercise per Week: 0 days    Minutes of Exercise per Session: 0 min  Stress: Stress Concern Present (06/15/2022)   [employer] of Occupational Health - Occupational Stress Questionnaire    Feeling of Stress : To some extent  Social Connections: Moderately Integrated (06/15/2022)   Social Connection and Isolation Panel [NHANES]    Frequency of Communication  with Friends and Family: More than three times a week    Frequency of Social Gatherings with Friends and Family: Once a week    Attends Religious Services: More than 4 times per year    Active Member of [employer] or Organizations: Yes    Attends Banker Meetings: 1 to 4 times per year    Marital Status: Widowed  Intimate Partner Violence: Not At Risk (06/15/2022)   Humiliation, Afraid,  Rape, and Kick questionnaire    Fear of Current or Ex-Partner: No    Emotionally Abused: No    Physically Abused: No    Sexually Abused: No    Review of Systems: ROS negative except for what is noted on the assessment and plan.  Vitals:   02/09/23 1116  BP: (!) 160/85  Pulse: 80  Temp: 98.3 F (36.8 C)  TempSrc: Oral  SpO2: 99%  Weight: 211 lb 3.2 oz (95.8 kg)  Height: 5\' 2"  (1.575 m)    Physical Exam: Constitutional: elderly female sitting in wheelchair, no acute distress Cardiovascular: regular rate and rhythm, systolic murmur Pulmonary/Chest: normal work of breathing on 2 L [place] [employer] MSK: normal bulk and tone, no LE edema Neurological: alert & oriented x 3, no focal deficit Skin: warm and dry Psych: normal mood and behavior  Assessment & Plan:    Patient discussed with Dr. Antony Contras  Essential hypertension Presents for a 6 month follow up of her BP. BP today is elevated to 160/85 (was 190/82 at her cardiology visit 2 weeks ago). She is on coreg 25 mg bid, losartan 100 mg daily, and has been out of her amlodipine 10 mg daily for a few months. Her cardiologist started her on chlorthalidone 12.5 mg daily, but she has not yet started this medicine as it was sent to her mail order pharmacy.   Plan: - Continue losartan, coreg - Start Caduet (atorva-amlodipine) to reduce pill burden - Start chlorthalidone 12.5 mg daily when it comes in the mail - Follow up in 2 weeks for BP recheck (advised to bring all her meds again)  (HFpEF) heart failure with preserved ejection fraction  (HCC) -Patient has been taking lasix 40 mg daily as she did not know it was PRN. She has no LE edema and does not need to take it daily, which she is now aware of. Of note, patient's cardiologist sent her K and Mg supplementation due to low levels. We are rechecking a BMP today.   -Not on SGLT2i due to pill burden  Hypothyroidism -Patient has been taking levothyroxine 12.5 mg daily although she was advised to stop it in February (TSH was normal off of it).  -Rechecking TSH today  Healthcare maintenance -Flu shot given today   Ellia Knowlton, D.O. [facility], PGY-3 Phone: [phone] Date 02/09/2023 Time 1:13 PM

## 2023-02-09 NOTE — Patient Instructions (Signed)
Thank you, Ms.Benjaman Pott for allowing [place] to provide your care today. Today we discussed:  Blood pressure Keep taking coreg twice a day Keep taking losartan once a day Your cardiologist sent in a medicine called chlorthalidone - when this gets mailed to you take it once a day Keep taking the potassium and magnesium daily until I call you with your lab results START Caduet once a day (this is a combo blood pressure and cholesterol medicine)  Your lasix - this medicine should be taken AS NEEDED for weight gain and leg swelling (you do not need to take it everyday)  I have ordered the following labs for you:   Lab Orders         BMP8+Anion Gap         TSH         Magnesium       Referrals ordered today:   Referral Orders  No referral(s) requested today     I have ordered the following medication/changed the following medications:   Stop the following medications: There are no discontinued medications.   Start the following medications: Meds ordered this encounter  Medications   amLODipine-atorvastatin (CADUET) 10-40 MG tablet    Sig: Take 1 tablet by mouth daily.    Dispense:  90 tablet    Refill:  3     Follow up:  2 weeks      Should you have any questions or concerns please call the internal medicine clinic at [phone].     Elza Rafter, D.O. [facility]

## 2023-02-09 NOTE — Assessment & Plan Note (Signed)
-  Patient has been taking levothyroxine 12.5 mg daily although she was advised to stop it in February (TSH was normal off of it).  -Rechecking TSH today

## 2023-02-10 LAB — BMP8+ANION GAP
Anion Gap: 14 mmol/L (ref 10.0–18.0)
BUN/Creatinine Ratio: 23 (ref 12–28)
BUN: 24 mg/dL (ref 8–27)
CO2: 27 mmol/L (ref 20–29)
Calcium: 10.6 mg/dL — ABNORMAL HIGH (ref 8.7–10.3)
Chloride: 98 mmol/L (ref 96–106)
Creatinine, Ser: 1.04 mg/dL — ABNORMAL HIGH (ref 0.57–1.00)
Glucose: 111 mg/dL — ABNORMAL HIGH (ref 70–99)
Potassium: 4.3 mmol/L (ref 3.5–5.2)
Sodium: 139 mmol/L (ref 134–144)
eGFR: 53 mL/min/{1.73_m2} — ABNORMAL LOW (ref >59)

## 2023-02-10 LAB — TSH: TSH: 3.61 u[IU]/mL (ref 0.450–4.500)

## 2023-02-10 LAB — MAGNESIUM: Magnesium: 2.1 mg/dL (ref 1.6–2.3)

## 2023-02-10 MED ORDER — ACETAMINOPHEN-CODEINE 300-30 MG PO TABS
ORAL_TABLET | ORAL | 0 refills | Status: DC
Start: 2023-02-10 — End: 2023-03-20

## 2023-02-10 NOTE — Progress Notes (Signed)
Called patient about results. Cr slightly elevated, advised patient to make sure she stays hydrated. K is now normalized, but since she is starting chlorthalidone, we will keep her on K supplementation at least for now. TSH normal - okay to remain off of levothyroxine at this time.

## 2023-02-10 NOTE — Addendum Note (Signed)
Addended by: Elza Rafter on: 02/10/2023 02:53 PM   Modules accepted: Orders

## 2023-02-11 ENCOUNTER — Other Ambulatory Visit: Payer: Self-pay

## 2023-02-11 ENCOUNTER — Emergency Department (HOSPITAL_COMMUNITY)
Admission: EM | Admit: 2023-02-11 | Discharge: 2023-02-12 | Disposition: A | Discharge status: Home / Self Care | Payer: Medicare HMO | Attending: Emergency Medicine | Admitting: Emergency Medicine

## 2023-02-11 DIAGNOSIS — Z853 Personal history of malignant neoplasm of breast: Secondary | ICD-10-CM | POA: Insufficient documentation

## 2023-02-11 DIAGNOSIS — Z7982 Long term (current) use of aspirin: Secondary | ICD-10-CM | POA: Insufficient documentation

## 2023-02-11 DIAGNOSIS — M25572 Pain in left ankle and joints of left foot: Secondary | ICD-10-CM | POA: Diagnosis not present

## 2023-02-11 DIAGNOSIS — Z9889 Other specified postprocedural states: Secondary | ICD-10-CM | POA: Diagnosis not present

## 2023-02-11 DIAGNOSIS — J45909 Unspecified asthma, uncomplicated: Secondary | ICD-10-CM | POA: Diagnosis not present

## 2023-02-11 DIAGNOSIS — I110 Hypertensive heart disease with heart failure: Secondary | ICD-10-CM | POA: Diagnosis not present

## 2023-02-11 DIAGNOSIS — Z79899 Other long term (current) drug therapy: Secondary | ICD-10-CM | POA: Diagnosis not present

## 2023-02-11 DIAGNOSIS — S73004A Unspecified dislocation of right hip, initial encounter: Secondary | ICD-10-CM

## 2023-02-11 DIAGNOSIS — E039 Hypothyroidism, unspecified: Secondary | ICD-10-CM | POA: Diagnosis not present

## 2023-02-11 DIAGNOSIS — M25551 Pain in right hip: Secondary | ICD-10-CM | POA: Diagnosis not present

## 2023-02-11 DIAGNOSIS — I509 Heart failure, unspecified: Secondary | ICD-10-CM | POA: Diagnosis not present

## 2023-02-11 DIAGNOSIS — T84029A Dislocation of unspecified internal joint prosthesis, initial encounter: Secondary | ICD-10-CM | POA: Diagnosis not present

## 2023-02-11 DIAGNOSIS — W19XXXA Unspecified fall, initial encounter: Secondary | ICD-10-CM | POA: Diagnosis not present

## 2023-02-11 DIAGNOSIS — T84020A Dislocation of internal right hip prosthesis, initial encounter: Secondary | ICD-10-CM | POA: Diagnosis not present

## 2023-02-11 MED ORDER — HYDROMORPHONE HCL 1 MG/ML IJ SOLN
0.5000 mg | Freq: Once | INTRAMUSCULAR | Status: AC
  Administered 2023-02-12: 0.5 mg via INTRAVENOUS
  Filled 2023-02-11: qty 1

## 2023-02-11 NOTE — ED Triage Notes (Signed)
Pt brought by EMS from home with complaints of mechanical fall. As per pt, she was trying to put at socks when she fall. She landed on her right hip and she felt something pop. History of right hip replacement, not on thinners, denied LOC

## 2023-02-11 NOTE — ED Provider Notes (Signed)
[place] EMERGENCY DEPARTMENT AT [facility] Provider Note  CSN: [id number] Arrival date & time: 02/11/23 2300  Chief Complaint(s) Fall  [name] is a 87 y.o. female here with right hip pain. Patient bent over to put socks on and felt her hip pop out, causing her to fall to the floor. Denies injuries related to the fall itself. Complains only of right hip pain. Worse with movement. Better with immobilization. Denies head trauma, LOC, headache, neck pain, back pain, chest pain, abd pain, or other extremity pain.  The history is provided by the patient and the EMS personnel.    Past Medical History Past Medical History:  Diagnosis Date   Allergic rhinitis, cause unspecified    Asthma    B12 deficiency 06/15/2022   Cancer (HCC)    CHF exacerbation (HCC) 05/08/2022   Chronic back pain    Coronary artery calcification seen on CAT scan 04/21/2017   Enlarged pulmonary artery (HCC) 04/21/2017   By chest CT   Frequent urination 11/14/2017   GERD (gastroesophageal reflux disease) 05/19/2011   history of Bilateral leg edema-likely amlodipine induced. 07/28/2011   history of CAP (community acquired pneumonia) 07/14/2011   history of HYPOTHYROIDISM, BORDERLINE 03/20/2006   Annotation: Asymptomatic, untreated Qualifier: Diagnosis of  By: Candis Musa MD, Ruben Reason.    Hx of breast cancer    Hyperlipidemia    Hypertension    Hypothyroidism    Hypothyroidism 03/20/2006   Annotation: Asymptomatic, untreated  Qualifier: Diagnosis of   By: Candis Musa MD, Ruben Reason.      Lumbago    Murmur, cardiac 11/22/2016   OBESITY NOS 03/20/2006   Qualifier: Diagnosis of  By: Candis Musa MD, Veronique D.    OSTEOARTHRITIS 12/13/2005   Annotation: bilateral knees;right knee injection 6/05 Qualifier: Diagnosis of  By: Candis Musa MD, Ruben Reason.    Pain, dental 09/18/2020   Preventative health care 09/04/2014   Pulmonary nodule 05/30/2017   4 mm right middle lobe nodule noted on  high-resolution CT done on February 06, 2017   right shoulder pain, likely Impingement syndrome  09/09/2010   Upper respiratory infection 04/09/2018   VENOUS INSUFFICIENCY, CHRONIC 03/20/2006   Qualifier: Diagnosis of  By: Candis Musa MD, Ruben Reason.    Patient Active Problem List   Diagnosis Date Noted   Gastroenteritis 08/10/2022   (HFpEF) heart failure with preserved ejection fraction (HCC) 06/15/2022   Nocturnal polyuria 04/27/2022   ILD (interstitial lung disease) (HCC) 01/03/2022   Right shoulder pain 08/24/2021   Moderate aortic stenosis 08/24/2021   Chronic respiratory failure with hypoxia (HCC) 12/16/2020   OSA (obstructive sleep apnea) 12/16/2020   Pruritus 10/21/2020   Elevated hemidiaphragm 03/10/2020   Healthcare maintenance 03/10/2020   Nocturnal hypoxia 03/10/2020   Anemia 04/09/2018   Coronary artery calcification seen on CAT scan 04/21/2017   Enlarged pulmonary artery (HCC) 04/21/2017   Restrictive lung disease 11/22/2016   Borderline diabetes 09/19/2013   GERD (gastroesophageal reflux disease) 05/19/2011   Hyperlipidemia 05/02/2007   Severe obesity (BMI >= 40) (HCC) 03/20/2006   Chronic venous insufficiency 03/20/2006   Right-sided low back pain with right-sided sciatica 03/20/2006   BREAST CANCER, HX OF 03/20/2006   Essential hypertension 12/13/2005   Osteoarthritis 12/13/2005   Home Medication(s) Prior to Admission medications   Medication Sig Start Date End Date Taking? Authorizing Provider  acetaminophen-codeine (TYLENOL #3) 300-30 MG tablet TAKE 1 TABLET EVERY 4 HOURS AS NEEDED FOR MODERATE PAIN. 02/10/23   Atway, Rayann N, DO  albuterol (  VENTOLIN HFA) 108 (90 Base) MCG/ACT inhaler INHALE 2 PUFFS INTO THE LUNGS EVERY 6 HOURS AS NEEDED FOR WHEEZING OR SHORTNESS OF BREATH. 03/04/22   Mercie Eon, MD  amLODipine (NORVASC) 10 MG tablet Take 1 tablet (10 mg total) by mouth daily. 06/15/22 06/15/23  Mercie Eon, MD  amLODipine-atorvastatin (CADUET) 10-40 MG  tablet Take 1 tablet by mouth daily. 02/09/23 02/09/24  Chauncey Mann, DO  aspirin EC 81 MG tablet Take 81 mg by mouth daily.    [provider]  atorvastatin (LIPITOR) 40 MG tablet Take 1 tablet (40 mg total) by mouth daily. 10/31/22   Mercie Eon, MD  brimonidine (ALPHAGAN) 0.2 % ophthalmic solution Place 1 drop into the left eye daily. 07/20/21   [provider]  carvedilol (COREG) 25 MG tablet TAKE 1 TABLET BY MOUTH TWICE DAILY WITH A MEAL 10/31/22   Mercie Eon, MD  chlorthalidone (HYGROTON) 25 MG tablet Take 0.5 tablets (12.5 mg total) by mouth daily. 01/25/23 04/25/23  Little Ishikawa, MD  diclofenac Sodium (VOLTAREN) 1 % GEL APPLY 2 GRAMS TOPICALLY 4 TIMES DAILY Patient taking differently: Apply 2 g topically 4 (four) times daily as needed (Pain). 01/14/22   Earl Lagos, MD  dorzolamide (TRUSOPT) 2 % ophthalmic solution Place 1 drop into the left eye 3 (three) times daily. 06/17/22 06/17/23  Mercie Eon, MD  fluticasone furoate-vilanterol (BREO ELLIPTA) 200-25 MCG/ACT AEPB Inhale 1 puff into the lungs daily. 07/04/22   Glenford Bayley, NP  furosemide (LASIX) 40 MG tablet Take 40 mg  tablet by mouth  as needed  daily ,for weight gain of 3 lbs overnight or 5 lbs in a week 09/20/22   Little Ishikawa, MD  latanoprost (XALATAN) 0.005 % ophthalmic solution Place 1 drop into the left eye daily. 06/26/21   [provider]  losartan (COZAAR) 100 MG tablet TAKE 1 TABLET EVERY DAY 11/28/22   Mercie Eon, MD  Magnesium Oxide 400 MG CAPS Take 400 mg daily 01/26/23   Little Ishikawa, MD  pantoprazole (PROTONIX) 40 MG tablet Take 1 tablet (40 mg total) by mouth daily. 10/31/22   Mercie Eon, MD  potassium chloride SA (KLOR-CON M) 20 MEQ tablet Take 1 tablet (20 mEq total) by mouth daily. 01/26/23   Little Ishikawa, MD                                                                                                                                     Allergies Acyclovir and related, Metoprolol, and Food  Review of Systems Review of Systems As noted in HPI  Physical Exam Vital Signs  I have reviewed the triage vital signs BP 102/66   Pulse 73   Temp 98.2 F (36.8 C) (Oral)   Resp 18   Ht 5\' 2"  (1.575 m)   Wt 95.7 kg   SpO2 100%   BMI 38.59 kg/m   Physical Exam  Constitutional:      General: She is not in acute distress.    Appearance: She is well-developed. She is not diaphoretic.  HENT:     Head: Normocephalic and atraumatic.     Right Ear: External ear normal.     Left Ear: External ear normal.     Nose: Nose normal.  Eyes:     General: No scleral icterus.       Right eye: No discharge.        Left eye: No discharge.     Conjunctiva/sclera: Conjunctivae normal.     Pupils: Pupils are equal, round, and reactive to light.  Cardiovascular:     Rate and Rhythm: Normal rate and regular rhythm.     Pulses:          Radial pulses are 2+ on the right side and 2+ on the left side.       Dorsalis pedis pulses are 2+ on the right side and 2+ on the left side.     Heart sounds: Normal heart sounds. No murmur heard.    No friction rub. No gallop.  Pulmonary:     Effort: Pulmonary effort is normal. No respiratory distress.     Breath sounds: Normal breath sounds. No stridor. No wheezing.  Abdominal:     General: There is no distension.     Palpations: Abdomen is soft.     Tenderness: There is no abdominal tenderness.  Musculoskeletal:     Cervical back: Normal range of motion and neck supple. No bony tenderness.     Thoracic back: No bony tenderness.     Lumbar back: No bony tenderness.     Right hip: Deformity (shortened and int rotated) and tenderness present. Decreased range of motion. Normal strength.     Left hip: Normal strength.     Right foot: Normal pulse.     Left foot: Normal pulse.     Comments: Clavicles stable. Chest stable to AP/Lat compression. Pelvis stable to Lat compression. No chest or  abdominal wall contusion.  Skin:    General: Skin is warm and dry.     Findings: No erythema or rash.  Neurological:     Mental Status: She is alert and oriented to person, place, and time.     Comments: Moving all extremities     ED Results and Treatments Labs (all labs ordered are listed, but only abnormal results are displayed) Labs Reviewed  CBC WITH DIFFERENTIAL/PLATELET - Abnormal; Notable for the following components:      Result Value   RBC 3.57 (*)    Hemoglobin 10.0 (*)    HCT 31.3 (*)    All other components within normal limits  BASIC METABOLIC PANEL - Abnormal; Notable for the following components:   Sodium 133 (*)    Chloride 97 (*)    Glucose, Bld 151 (*)    BUN 31 (*)    Creatinine, Ser 1.38 (*)    GFR, Estimated 38 (*)    All other components within normal limits  EKG  EKG Interpretation Date/Time:    Ventricular Rate:    PR Interval:    QRS Duration:    QT Interval:    QTC Calculation:   R Axis:      Text Interpretation:         Radiology DG HIP UNILAT W OR W/O PELVIS 2-3 VIEWS RIGHT  Result Date: 02/12/2023 CLINICAL DATA:  Status post reduction EXAM: DG HIP (WITH OR WITHOUT PELVIS) 3V RIGHT COMPARISON:  Film from earlier in the same day. FINDINGS: Femoral component is now seated in the acetabular component. No acute bony abnormality is noted. No soft tissue changes are seen. IMPRESSION: Status post reduction of the femoral component. Electronically Signed   By: Alcide Clever M.D.   On: 02/12/2023 02:53   DG HIP UNILAT W OR W/O PELVIS 2-3 VIEWS RIGHT  Result Date: 02/12/2023 CLINICAL DATA:  Recent fall with possible dislocation, initial encounter EXAM: DG HIP (WITH OR WITHOUT PELVIS) 3V RIGHT COMPARISON:  12/01/2018 FINDINGS: There is dislocation of the femoral component from the acetabular component superiorly. No fracture is  identified. No other focal abnormality is seen. IMPRESSION: Superior dislocation of the femoral component from the acetabular component in the right hip. Electronically Signed   By: Alcide Clever M.D.   On: 02/12/2023 00:47    Medications Ordered in ED Medications  ketamine 50 mg in normal saline 5 mL (10 mg/mL) syringe (has no administration in time range)  propofol (DIPRIVAN) 10 mg/mL bolus/IV push 100 mg (has no administration in time range)  etomidate (AMIDATE) injection 10 mg (has no administration in time range)  HYDROmorphone (DILAUDID) injection 0.5 mg (0.5 mg Intravenous Given 02/12/23 0002)  HYDROmorphone (DILAUDID) injection 1 mg (1 mg Intravenous Given 02/12/23 0128)  ketamine (KETALAR) injection (40 mg Intravenous Given 02/12/23 0154)  propofol (DIPRIVAN) 10 mg/mL bolus/IV push (40 mg Intravenous Given 02/12/23 0155)   Procedures .Sedation  Date/Time: 02/12/2023 1:43 AM  Performed by: Nira Conn, MD Authorized by: Nira Conn, MD   Consent:    Consent obtained:  Verbal   Consent given by:  Patient   Risks discussed:  Allergic reaction, dysrhythmia, inadequate sedation, nausea, prolonged hypoxia resulting in organ damage, prolonged sedation necessitating reversal, respiratory compromise necessitating ventilatory assistance and intubation and vomiting   Alternatives discussed:  Analgesia without sedation, anxiolysis and regional anesthesia Universal protocol:    Procedure explained and questions answered to patient or proxy's satisfaction: yes     Relevant documents present and verified: yes     Test results available: yes     Imaging studies available: yes     Required blood products, implants, devices, and special equipment available: yes     Site/side marked: yes     Immediately prior to procedure, a time out was called: yes     Patient identity confirmed:  Verbally with patient and arm band Indications:    Procedure performed:  Dislocation reduction    Procedure necessitating sedation performed by:  Physician performing sedation Pre-sedation assessment:    Time since last food or drink:  2000   ASA classification: class 3 - patient with severe systemic disease     Mouth opening:  2 finger widths   Thyromental distance:  3 finger widths   Mallampati score:  II - soft palate, uvula, fauces visible   Neck mobility: normal     Pre-sedation assessments completed and reviewed: airway patency, cardiovascular function, hydration status, mental status, nausea/vomiting, pain level, respiratory function and temperature  A pre-sedation assessment was completed prior to the start of the procedure Immediate pre-procedure details:    Reassessment: Patient reassessed immediately prior to procedure     Reviewed: vital signs, relevant labs/tests and NPO status     Verified: bag valve mask available, emergency equipment available, IV patency confirmed, oxygen available and suction available   Procedure details (see MAR for exact dosages):    Preoxygenation:  Nasal cannula   Sedation:  Propofol and ketamine   Intended level of sedation: deep   Intra-procedure monitoring:  Blood pressure monitoring, cardiac monitor, continuous pulse oximetry, frequent LOC assessments, frequent vital sign checks and continuous capnometry   Intra-procedure events: hypoxia and respiratory depression     Intra-procedure management:  Airway repositioning and BVM ventilation   Total Provider sedation time (minutes):  12 Post-procedure details:   A post-sedation assessment was completed following the completion of the procedure.   Attendance: Constant attendance by certified staff until patient recovered     Recovery: Patient returned to pre-procedure baseline     Post-sedation assessments completed and reviewed: airway patency, cardiovascular function, hydration status, mental status, nausea/vomiting, pain level, respiratory function and temperature     Patient is stable for  discharge or admission: yes     Procedure completion:  Tolerated well, no immediate complications .Ortho Injury Treatment  Date/Time: 02/12/2023 1:44 AM  Performed by: Nira Conn, MD Authorized by: Nira Conn, MD   Consent:    Consent obtained:  Written   Consent given by:  Patient   Risks discussed:  Irreducible dislocation, recurrent dislocation, vascular damage and stiffnessInjury location: hip Location details: right hip Injury type: dislocation Dislocation type: posterior Spontaneous dislocation: yes Prosthesis: yes Pre-procedure neurovascular assessment: neurovascularly intact  Patient sedated: Yes. Refer to sedation procedure documentation for details of sedation. Manipulation performed: yes Reduction method: traction and counter traction and external rotation Reduction successful: yes X-ray confirmed reduction: yes Immobilization: brace Splint Applied by: ED Provider Post-procedure neurovascular assessment: post-procedure neurovascularly intact     (including critical care time) Medical Decision Making / ED Course   Medical Decision Making Amount and/or Complexity of Data Reviewed Labs: ordered. Radiology: ordered and independent interpretation performed. Decision-making details documented in ED Course.  Risk Prescription drug management.    Right prosthetic hip dislocation and mechanical fall. No injuries from the fall. Dislocation reduced under procedural sedation. Knee immobilizer placed. Patient already has walker at home. Ortho f/u recommended.    Final Clinical Impression(s) / ED Diagnoses Final diagnoses:  Closed dislocation of right hip, initial encounter [facility])   The patient appears reasonably screened and/or stabilized for discharge and I doubt any other medical condition or other [facility] requiring further screening, evaluation, or treatment in the ED at this time. I have discussed the findings, Dx and Tx plan with the  patient/family who expressed understanding and agree(s) with the plan. Discharge instructions discussed at length. The patient/family was given strict return precautions who verbalized understanding of the instructions. No further questions at time of discharge.  Disposition: Discharge  Condition: Good  ED Discharge Orders     None        Follow Up: Nadara Mustard, MD [address] [place] [address] [phone]  Call  to schedule an appointment for close follow up  Mercie Eon, MD [address] [place], [address] [place] [address] [phone]  Call  to schedule an appointment for close follow up    This chart was dictated using voice recognition software.  Despite best efforts to proofread,  errors can occur which can change the documentation meaning.    Nira Conn, MD 02/12/23 [phone]

## 2023-02-11 NOTE — ED Notes (Signed)
Pt hard stick , still working on line.

## 2023-02-11 NOTE — ED Provider Notes (Incomplete)
EMERGENCY DEPARTMENT AT [facility] Provider Note  CSN: [id number] Arrival date & time: 02/11/23 2300  Chief Complaint(s) Fall  HPI Natalie MIGUES is a 87 y.o. female here with right hip pain. Patient bent over to put socks on and felt her hip pop out, causing her to fall to the floor. Denies injuries related to the fall itself. Complains only of right hip pain. Worse with movement. Better with immobilization. Denies head trauma, LOC, headache, neck pain, back pain, chest pain, abd pain, or other extremity pain.  The history is provided by the patient and the EMS personnel.    Past Medical History Past Medical History:  Diagnosis Date  . Allergic rhinitis, cause unspecified   . Asthma   . B12 deficiency 06/15/2022  . Cancer (HCC)   . CHF exacerbation (HCC) 05/08/2022  . Chronic back pain   . Coronary artery calcification seen on CAT scan 04/21/2017  . Enlarged pulmonary artery (HCC) 04/21/2017   By chest CT  . Frequent urination 11/14/2017  . GERD (gastroesophageal reflux disease) 05/19/2011  . history of Bilateral leg edema-likely amlodipine induced. 07/28/2011  . history of CAP (community acquired pneumonia) 07/14/2011  . history of HYPOTHYROIDISM, BORDERLINE 03/20/2006   Annotation: Asymptomatic, untreated Qualifier: Diagnosis of  By: Candis Musa MD, Ruben Reason.   . Hx of breast cancer   . Hyperlipidemia   . Hypertension   . Hypothyroidism   . Hypothyroidism 03/20/2006   Annotation: Asymptomatic, untreated  Qualifier: Diagnosis of   By: Candis Musa MD, Ruben Reason.     . Lumbago   . Murmur, cardiac 11/22/2016  . OBESITY NOS 03/20/2006   Qualifier: Diagnosis of  By: Candis Musa MD, Veronique D.   . OSTEOARTHRITIS 12/13/2005   Annotation: bilateral knees;right knee injection 6/05 Qualifier: Diagnosis of  By: Candis Musa MD, Veronique D.   . Pain, dental 09/18/2020  . Preventative health care 09/04/2014  . Pulmonary nodule 05/30/2017   4 mm right middle lobe  nodule noted on high-resolution CT done on February 06, 2017  . right shoulder pain, likely Impingement syndrome  09/09/2010  . Upper respiratory infection 04/09/2018  . VENOUS INSUFFICIENCY, CHRONIC 03/20/2006   Qualifier: Diagnosis of  By: Candis Musa MD, Ruben Reason.    Patient Active Problem List   Diagnosis Date Noted  . Gastroenteritis 08/10/2022  . (HFpEF) heart failure with preserved ejection fraction (HCC) 06/15/2022  . Nocturnal polyuria 04/27/2022  . ILD (interstitial lung disease) (HCC) 01/03/2022  . Right shoulder pain 08/24/2021  . Moderate aortic stenosis 08/24/2021  . Chronic respiratory failure with hypoxia (HCC) 12/16/2020  . OSA (obstructive sleep apnea) 12/16/2020  . Pruritus 10/21/2020  . Elevated hemidiaphragm 03/10/2020  . Healthcare maintenance 03/10/2020  . Nocturnal hypoxia 03/10/2020  . Anemia 04/09/2018  . Coronary artery calcification seen on CAT scan 04/21/2017  . Enlarged pulmonary artery (HCC) 04/21/2017  . Restrictive lung disease 11/22/2016  . Borderline diabetes 09/19/2013  . GERD (gastroesophageal reflux disease) 05/19/2011  . Hyperlipidemia 05/02/2007  . Severe obesity (BMI >= 40) (HCC) 03/20/2006  . Chronic venous insufficiency 03/20/2006  . Right-sided low back pain with right-sided sciatica 03/20/2006  . BREAST CANCER, HX OF 03/20/2006  . Essential hypertension 12/13/2005  . Osteoarthritis 12/13/2005   Home Medication(s) Prior to Admission medications   Medication Sig Start Date End Date Taking? Authorizing Provider  acetaminophen-codeine (TYLENOL #3) 300-30 MG tablet TAKE 1 TABLET EVERY 4 HOURS AS NEEDED FOR MODERATE PAIN. 02/10/23   Atway, Rayann N, DO  albuterol (  VENTOLIN HFA) 108 (90 Base) MCG/ACT inhaler INHALE 2 PUFFS INTO THE LUNGS EVERY 6 HOURS AS NEEDED FOR WHEEZING OR SHORTNESS OF BREATH. 03/04/22   Mercie Eon, MD  amLODipine (NORVASC) 10 MG tablet Take 1 tablet (10 mg total) by mouth daily. 06/15/22 06/15/23  Mercie Eon, MD   amLODipine-atorvastatin (CADUET) 10-40 MG tablet Take 1 tablet by mouth daily. 02/09/23 02/09/24  Chauncey Mann, DO  aspirin EC 81 MG tablet Take 81 mg by mouth daily.    [provider]  atorvastatin (LIPITOR) 40 MG tablet Take 1 tablet (40 mg total) by mouth daily. 10/31/22   Mercie Eon, MD  brimonidine (ALPHAGAN) 0.2 % ophthalmic solution Place 1 drop into the left eye daily. 07/20/21   [provider]  carvedilol (COREG) 25 MG tablet TAKE 1 TABLET BY MOUTH TWICE DAILY WITH A MEAL 10/31/22   Mercie Eon, MD  chlorthalidone (HYGROTON) 25 MG tablet Take 0.5 tablets (12.5 mg total) by mouth daily. 01/25/23 04/25/23  Little Ishikawa, MD  diclofenac Sodium (VOLTAREN) 1 % GEL APPLY 2 GRAMS TOPICALLY 4 TIMES DAILY Patient taking differently: Apply 2 g topically 4 (four) times daily as needed (Pain). 01/14/22   Earl Lagos, MD  dorzolamide (TRUSOPT) 2 % ophthalmic solution Place 1 drop into the left eye 3 (three) times daily. 06/17/22 06/17/23  Mercie Eon, MD  fluticasone furoate-vilanterol (BREO ELLIPTA) 200-25 MCG/ACT AEPB Inhale 1 puff into the lungs daily. 07/04/22   Glenford Bayley, NP  furosemide (LASIX) 40 MG tablet Take 40 mg  tablet by mouth  as needed  daily ,for weight gain of 3 lbs overnight or 5 lbs in a week 09/20/22   Little Ishikawa, MD  latanoprost (XALATAN) 0.005 % ophthalmic solution Place 1 drop into the left eye daily. 06/26/21   [provider]  losartan (COZAAR) 100 MG tablet TAKE 1 TABLET EVERY DAY 11/28/22   Mercie Eon, MD  Magnesium Oxide 400 MG CAPS Take 400 mg daily 01/26/23   Little Ishikawa, MD  pantoprazole (PROTONIX) 40 MG tablet Take 1 tablet (40 mg total) by mouth daily. 10/31/22   Mercie Eon, MD  potassium chloride SA (KLOR-CON M) 20 MEQ tablet Take 1 tablet (20 mEq total) by mouth daily. 01/26/23   Little Ishikawa, MD                                                                                                                                     Allergies Acyclovir and related, Metoprolol, and Food  Review of Systems Review of Systems As noted in HPI  Physical Exam Vital Signs  I have reviewed the triage vital signs BP 112/66 (BP Location: Left Arm)   Pulse 78   Temp 97.7 F (36.5 C) (Oral)   Resp 20   SpO2 100%   Physical Exam Constitutional:      General: She is not in acute distress.  Appearance: She is well-developed. She is not diaphoretic.  HENT:     Head: Normocephalic and atraumatic.     Right Ear: External ear normal.     Left Ear: External ear normal.     Nose: Nose normal.  Eyes:     General: No scleral icterus.       Right eye: No discharge.        Left eye: No discharge.     Conjunctiva/sclera: Conjunctivae normal.     Pupils: Pupils are equal, round, and reactive to light.  Cardiovascular:     Rate and Rhythm: Normal rate and regular rhythm.     Pulses:          Radial pulses are 2+ on the right side and 2+ on the left side.       Dorsalis pedis pulses are 2+ on the right side and 2+ on the left side.     Heart sounds: Normal heart sounds. No murmur heard.    No friction rub. No gallop.  Pulmonary:     Effort: Pulmonary effort is normal. No respiratory distress.     Breath sounds: Normal breath sounds. No stridor. No wheezing.  Abdominal:     General: There is no distension.     Palpations: Abdomen is soft.     Tenderness: There is no abdominal tenderness.  Musculoskeletal:     Cervical back: Normal range of motion and neck supple. No bony tenderness.     Thoracic back: No bony tenderness.     Lumbar back: No bony tenderness.     Right hip: Deformity (shortened and int rotated) and tenderness present. Decreased range of motion. Normal strength.     Left hip: Normal strength.     Right foot: Normal pulse.     Left foot: Normal pulse.     Comments: Clavicles stable. Chest stable to AP/Lat compression. Pelvis stable to Lat compression. No  chest or abdominal wall contusion.  Skin:    General: Skin is warm and dry.     Findings: No erythema or rash.  Neurological:     Mental Status: She is alert and oriented to person, place, and time.     Comments: Moving all extremities     ED Results and Treatments Labs (all labs ordered are listed, but only abnormal results are displayed) Labs Reviewed - No data to display                                                                                                                       EKG  EKG Interpretation Date/Time:    Ventricular Rate:    PR Interval:    QRS Duration:    QT Interval:    QTC Calculation:   R Axis:      Text Interpretation:         Radiology No results found.  Medications Ordered in ED Medications  HYDROmorphone (DILAUDID) injection 0.5 mg (has no administration in time range)  Procedures Procedures  (including critical care time) Medical Decision Making / ED Course   Medical Decision Making Amount and/or Complexity of Data Reviewed Radiology: ordered and independent interpretation performed. Decision-making details documented in ED Course.  Risk Prescription drug management.    ***    Final Clinical Impression(s) / ED Diagnoses Final diagnoses:  None    This chart was dictated using voice recognition software.  Despite best efforts to proofread,  errors can occur which can change the documentation meaning.

## 2023-02-12 ENCOUNTER — Emergency Department (HOSPITAL_COMMUNITY): Payer: Medicare HMO

## 2023-02-12 DIAGNOSIS — Z9889 Other specified postprocedural states: Secondary | ICD-10-CM | POA: Diagnosis not present

## 2023-02-12 DIAGNOSIS — I10 Essential (primary) hypertension: Secondary | ICD-10-CM | POA: Diagnosis not present

## 2023-02-12 DIAGNOSIS — Z7401 Bed confinement status: Secondary | ICD-10-CM | POA: Diagnosis not present

## 2023-02-12 DIAGNOSIS — T84020A Dislocation of internal right hip prosthesis, initial encounter: Secondary | ICD-10-CM | POA: Diagnosis not present

## 2023-02-12 LAB — CBC WITH DIFFERENTIAL/PLATELET
Abs Immature Granulocytes: 0.03 10*3/uL (ref 0.00–0.07)
Basophils Absolute: 0.1 10*3/uL (ref 0.0–0.1)
Basophils Relative: 1 %
Eosinophils Absolute: 0.4 10*3/uL (ref 0.0–0.5)
Eosinophils Relative: 5 %
HCT: 31.3 % — ABNORMAL LOW (ref 36.0–46.0)
Hemoglobin: 10 g/dL — ABNORMAL LOW (ref 12.0–15.0)
Immature Granulocytes: 0 %
Lymphocytes Relative: 18 %
Lymphs Abs: 1.5 10*3/uL (ref 0.7–4.0)
MCH: 28 pg (ref 26.0–34.0)
MCHC: 31.9 g/dL (ref 30.0–36.0)
MCV: 87.7 fL (ref 80.0–100.0)
Monocytes Absolute: 0.9 10*3/uL (ref 0.1–1.0)
Monocytes Relative: 10 %
Neutro Abs: 5.6 10*3/uL (ref 1.7–7.7)
Neutrophils Relative %: 66 %
Platelets: 204 10*3/uL (ref 150–400)
RBC: 3.57 MIL/uL — ABNORMAL LOW (ref 3.87–5.11)
RDW: 14.4 % (ref 11.5–15.5)
WBC: 8.6 10*3/uL (ref 4.0–10.5)
nRBC: 0 % (ref 0.0–0.2)

## 2023-02-12 LAB — BASIC METABOLIC PANEL
Anion gap: 8 (ref 5–15)
BUN: 31 mg/dL — ABNORMAL HIGH (ref 8–23)
CO2: 28 mmol/L (ref 22–32)
Calcium: 9.2 mg/dL (ref 8.9–10.3)
Chloride: 97 mmol/L — ABNORMAL LOW (ref 98–111)
Creatinine, Ser: 1.38 mg/dL — ABNORMAL HIGH (ref 0.44–1.00)
GFR, Estimated: 38 mL/min — ABNORMAL LOW (ref >60)
Glucose, Bld: 151 mg/dL — ABNORMAL HIGH (ref 70–99)
Potassium: 4.3 mmol/L (ref 3.5–5.1)
Sodium: 133 mmol/L — ABNORMAL LOW (ref 135–145)

## 2023-02-12 MED ORDER — KETAMINE HCL 10 MG/ML IJ SOLN
INTRAMUSCULAR | Status: AC | PRN
  Administered 2023-02-12: 40 mg via INTRAVENOUS

## 2023-02-12 MED ORDER — KETAMINE HCL 50 MG/5ML IJ SOSY
50.0000 mg | PREFILLED_SYRINGE | Freq: Once | INTRAMUSCULAR | Status: DC
  Filled 2023-02-12: qty 5

## 2023-02-12 MED ORDER — PROPOFOL 10 MG/ML IV BOLUS
INTRAVENOUS | Status: AC | PRN
  Administered 2023-02-12: 40 mg via INTRAVENOUS

## 2023-02-12 MED ORDER — ETOMIDATE 2 MG/ML IV SOLN
10.0000 mg | Freq: Once | INTRAVENOUS | Status: DC
  Filled 2023-02-12: qty 10

## 2023-02-12 MED ORDER — HYDROMORPHONE HCL 1 MG/ML IJ SOLN
1.0000 mg | Freq: Once | INTRAMUSCULAR | Status: AC
  Administered 2023-02-12: 1 mg via INTRAVENOUS
  Filled 2023-02-12: qty 1

## 2023-02-12 MED ORDER — PROPOFOL 10 MG/ML IV BOLUS
100.0000 mg | Freq: Once | INTRAVENOUS | Status: DC
  Filled 2023-02-12: qty 20

## 2023-02-12 NOTE — ED Notes (Signed)
This rn was able to reach out to her son. And he is positive that the grand daughter and son is at home to pick up the pt

## 2023-02-12 NOTE — Progress Notes (Signed)
Pt in for hip reduction under conscious sedation. RT remained bedside throughout the procedure. Ambu bag, suction, CO2 detector, and crash cart available. Pt had one small episode of apnea. Pt bagged for few minutes. No other complications noted. Pt is now awake, answering questions. RT released by MD.

## 2023-02-12 NOTE — Sedation Documentation (Signed)
Pt. Goes unresponsive with pulse and respirations. Hip reduced by Dr. Eudelia Bunch. Pt. O2 begins to decrease to 76. Pt. Is bagged to 100%. Pt. Then able to maintain own airway.

## 2023-02-13 ENCOUNTER — Telehealth: Payer: Self-pay

## 2023-02-13 NOTE — Transitions of Care (Post Inpatient/ED Visit) (Signed)
02/13/2023  Name: [name] MRN: [id number] DOB: 09/15/1935  Today's TOC FU Call Status: Today's TOC FU Call Status:: Successful TOC FU Call Completed TOC FU Call Complete Date: 02/13/23 Patient's Name and Date of Birth confirmed.  Transition Care Management Follow-up Telephone Call Date of Discharge: 02/12/23 Discharge Facility: [facility]: [facility]) Type of Discharge: Emergency Department Reason for ED Visit: Other: (dislocated hip) How have you been since you were released from the hospital?: Better Any questions or concerns?: No  Items Reviewed: Did you receive and understand the discharge instructions provided?: Yes Medications obtained,verified, and reconciled?: Yes (Medications Reviewed) Any new allergies since your discharge?: No Dietary orders reviewed?: Yes Do you have support at home?: Yes People in Home: child(ren), adult  Medications Reviewed Today: Medications Reviewed Today     Reviewed by Karena Addison, LPN (Licensed Practical Nurse) on 02/13/23 at 1345  Med List Status: <None>   Medication Order Taking? Sig Documenting Provider Last Dose Status Informant  acetaminophen-codeine (TYLENOL #3) 300-30 MG tablet [id number]  TAKE 1 TABLET EVERY 4 HOURS AS NEEDED FOR MODERATE PAIN. Atway, Rayann N, DO  Active   albuterol (VENTOLIN HFA) 108 (90 Base) MCG/ACT inhaler [id number] No INHALE 2 PUFFS INTO THE LUNGS EVERY 6 HOURS AS NEEDED FOR WHEEZING OR SHORTNESS OF BREATH. Mercie Eon, MD Taking Active Self, Pharmacy Records  amLODipine (NORVASC) 10 MG tablet [id number] No Take 1 tablet (10 mg total) by mouth daily. Mercie Eon, MD Taking Active   amLODipine-atorvastatin (CADUET) 10-40 MG tablet [id number]  Take 1 tablet by mouth daily. Chauncey Mann, DO  Active   aspirin EC 81 MG tablet [id number] No Take 81 mg by mouth daily. [provider] Taking Active Self, Pharmacy Records  atorvastatin (LIPITOR) 40 MG tablet [id number] No Take 1 tablet (40 mg total) by mouth  daily. Mercie Eon, MD Taking Active   brimonidine [facility]) 0.2 % ophthalmic solution [id number] No Place 1 drop into the left eye daily. [provider] Taking Active Self, Pharmacy Records  carvedilol (COREG) 25 MG tablet [id number] No TAKE 1 TABLET BY MOUTH TWICE DAILY WITH A MEAL Mercie Eon, MD Taking Active   chlorthalidone (HYGROTON) 25 MG tablet [id number]  Take 0.5 tablets (12.5 mg total) by mouth daily. Little Ishikawa, MD  Active   diclofenac Sodium (VOLTAREN) 1 % GEL [id number] No APPLY 2 GRAMS TOPICALLY 4 TIMES DAILY  Patient taking differently: Apply 2 g topically 4 (four) times daily as needed (Pain).   Earl Lagos, MD Taking Active Self, Pharmacy Records  dorzolamide (TRUSOPT) 2 % ophthalmic solution [id number] No Place 1 drop into the left eye 3 (three) times daily. Mercie Eon, MD Taking Active   fluticasone furoate-vilanterol (BREO ELLIPTA) 200-25 MCG/ACT AEPB [id number] No Inhale 1 puff into the lungs daily. Glenford Bayley, NP Taking Active   furosemide (LASIX) 40 MG tablet [id number] No Take 40 mg  tablet by mouth  as needed  daily ,for weight gain of 3 lbs overnight or 5 lbs in a week Little Ishikawa, MD Taking Active   latanoprost (XALATAN) 0.005 % ophthalmic solution [id number] No Place 1 drop into the left eye daily. [provider] Taking Active Self, Pharmacy Records  losartan (COZAAR) 100 MG tablet [id number] No TAKE 1 TABLET EVERY Polo Riley, MD Taking Active   Magnesium Oxide 400 MG CAPS [id number]  Take 400 mg daily Little Ishikawa, MD  Active   pantoprazole (PROTONIX) 40 MG tablet [id number] No Take 1 tablet (40  mg total) by mouth daily. Mercie Eon, MD Taking Active   potassium chloride SA (KLOR-CON M) 20 MEQ tablet [id number]  Take 1 tablet (20 mEq total) by mouth daily. Little Ishikawa, MD  Active             Home Care and Equipment/Supplies: Were Home Health Services Ordered?: NA Any new  equipment or medical supplies ordered?: NA  Functional Questionnaire: Do you need assistance with bathing/showering or dressing?: No Do you need assistance with meal preparation?: No Do you need assistance with eating?: No Do you have difficulty maintaining continence: No Do you need assistance with getting out of bed/getting out of a chair/moving?: No Do you have difficulty managing or taking your medications?: No  Follow up appointments reviewed: PCP Follow-up appointment confirmed?: Yes Date of PCP follow-up appointment?: 02/24/23 Follow-up Provider: [facility] Follow-up appointment confirmed?: NA Do you need transportation to your follow-up appointment?: No Do you understand care options if your condition(s) worsen?: Yes-patient verbalized understanding    SIGNATURE Karena Addison, LPN [facility] Nurse Health Advisor Direct Dial [phone]

## 2023-02-14 NOTE — Progress Notes (Signed)
Internal Medicine Clinic Attending  Case discussed with the resident at the time of the visit.  We reviewed the resident's history and exam and pertinent patient test results.  I agree with the assessment, diagnosis, and plan of care documented in the resident's note.  

## 2023-02-22 ENCOUNTER — Encounter: Payer: Medicare HMO | Admitting: Internal Medicine

## 2023-02-23 NOTE — Progress Notes (Unsigned)
CC: Hypertension/blood pressure recheck.  HPI:  [name] is a 87 y.o. female living with a history stated below and presents today for hypertension/BP recheck.. Please see problem based assessment and plan for additional details.  Past Medical History:  Diagnosis Date   Allergic rhinitis, cause unspecified    Asthma    B12 deficiency 06/15/2022   Cancer (HCC)    CHF exacerbation (HCC) 05/08/2022   Chronic back pain    Coronary artery calcification seen on CAT scan 04/21/2017   Enlarged pulmonary artery (HCC) 04/21/2017   By chest CT   Frequent urination 11/14/2017   GERD (gastroesophageal reflux disease) 05/19/2011   history of Bilateral leg edema-likely amlodipine induced. 07/28/2011   history of CAP (community acquired pneumonia) 07/14/2011   history of HYPOTHYROIDISM, BORDERLINE 03/20/2006   Annotation: Asymptomatic, untreated Qualifier: Diagnosis of  By: Candis Musa MD, Ruben Reason.    Hx of breast cancer    Hyperlipidemia    Hypertension    Hypothyroidism    Hypothyroidism 03/20/2006   Annotation: Asymptomatic, untreated  Qualifier: Diagnosis of   By: Candis Musa MD, Ruben Reason.      Lumbago    Murmur, cardiac 11/22/2016   OBESITY NOS 03/20/2006   Qualifier: Diagnosis of  By: Candis Musa MD, Veronique D.    OSTEOARTHRITIS 12/13/2005   Annotation: bilateral knees;right knee injection 6/05 Qualifier: Diagnosis of  By: Candis Musa MD, Ruben Reason.    Pain, dental 09/18/2020   Preventative health care 09/04/2014   Pulmonary nodule 05/30/2017   4 mm right middle lobe nodule noted on high-resolution CT done on February 06, 2017   right shoulder pain, likely Impingement syndrome  09/09/2010   Upper respiratory infection 04/09/2018   VENOUS INSUFFICIENCY, CHRONIC 03/20/2006   Qualifier: Diagnosis of  By: Candis Musa MD, Ruben Reason.     Current Outpatient Medications on File Prior to Visit  Medication Sig Dispense Refill   acetaminophen-codeine (TYLENOL #3) 300-30 MG tablet  TAKE 1 TABLET EVERY 4 HOURS AS NEEDED FOR MODERATE PAIN. 30 tablet 0   albuterol (VENTOLIN HFA) 108 (90 Base) MCG/ACT inhaler INHALE 2 PUFFS INTO THE LUNGS EVERY 6 HOURS AS NEEDED FOR WHEEZING OR SHORTNESS OF BREATH. 1 each 3   amLODipine (NORVASC) 10 MG tablet Take 1 tablet (10 mg total) by mouth daily. 90 tablet 3   amLODipine-atorvastatin (CADUET) 10-40 MG tablet Take 1 tablet by mouth daily. 90 tablet 3   aspirin EC 81 MG tablet Take 81 mg by mouth daily.     atorvastatin (LIPITOR) 40 MG tablet Take 1 tablet (40 mg total) by mouth daily. 90 tablet 3   brimonidine (ALPHAGAN) 0.2 % ophthalmic solution Place 1 drop into the left eye daily.     carvedilol (COREG) 25 MG tablet TAKE 1 TABLET BY MOUTH TWICE DAILY WITH A MEAL 180 tablet 3   chlorthalidone (HYGROTON) 25 MG tablet Take 0.5 tablets (12.5 mg total) by mouth daily. 90 tablet 3   diclofenac Sodium (VOLTAREN) 1 % GEL APPLY 2 GRAMS TOPICALLY 4 TIMES DAILY (Patient taking differently: Apply 2 g topically 4 (four) times daily as needed (Pain).) 700 g 10   dorzolamide (TRUSOPT) 2 % ophthalmic solution Place 1 drop into the left eye 3 (three) times daily. 10 mL 3   fluticasone furoate-vilanterol (BREO ELLIPTA) 200-25 MCG/ACT AEPB Inhale 1 puff into the lungs daily. 180 each 3   furosemide (LASIX) 40 MG tablet Take 40 mg  tablet by mouth  as needed  daily ,for weight gain  of 3 lbs overnight or 5 lbs in a week 30 tablet 6   latanoprost (XALATAN) 0.005 % ophthalmic solution Place 1 drop into the left eye daily.     losartan (COZAAR) 100 MG tablet TAKE 1 TABLET EVERY DAY 90 tablet 3   Magnesium Oxide 400 MG CAPS Take 400 mg daily 90 capsule 3   pantoprazole (PROTONIX) 40 MG tablet Take 1 tablet (40 mg total) by mouth daily. 90 tablet 3   potassium chloride SA (KLOR-CON M) 20 MEQ tablet Take 1 tablet (20 mEq total) by mouth daily. 90 tablet 3   No current facility-administered medications on file prior to visit.     Review of Systems: ROS  negative except for what is noted on the assessment and plan.  Vitals:   02/24/23 1107 02/24/23 1114  BP: (!) 161/89 (!) 138/100  Pulse: 72 67  Temp: 97.8 F (36.6 C)   TempSrc: Oral   SpO2: 100%   Weight: 215 lb 8 oz (97.8 kg)   Height: 5\' 2"  (1.575 m)     Physical Exam: Constitutional: Well appearing, not in acute distress. Cardiovascular: regular rate and rhythm, no m/r/g Pulmonary/Chest: normal work of breathing on room air, lungs clear to auscultation bilaterally Extremities: Right leg pitting edema >left.   Abdominal: soft, non-tender, non-distended  Assessment & Plan:    Patient seen with Dr. Cleda Daub  Right Hip Pain. She was seen in the ED on 12/8 for closed dislocation of the hip. She has intermittent right hip pain, has been uses lidocaine patch from her daughter.  - Order for lidocaine patch placed.  Essential hypertension BP Readings from Last 3 Encounters:  02/24/23 (!) 138/100  02/12/23 (!) 188/91  02/09/23 (!) 160/85   At this office visit, patient brought all his medicines.  She is taking chlorthalidone 12.5 mg daily as prescribed by her cardiologist, in addition to Coreg, losartan and Caudet.  Plan: - Continue losartan, coreg - Continue Caduet (atorva-amlodipine) to reduce pill burden -Continue chlorthalidone 12.5 mg daily  -Follow-up in 3 months.    (HFpEF) heart failure with preserved ejection fraction (HCC) She is taking Lasix 40 mg every other day.  On my exam, right leg with mild edema> left leg.  No JVD elevation, lungs clear to auscultation bilaterally.   -Not on SGLT2i due to pill burden -Continue Lasix 40 mg every other day.   Laretta Bolster, MD [facility] Internal Medicine, PGY-1 Phone: [phone] Date 02/25/2023 Time 11:24 PM

## 2023-02-24 ENCOUNTER — Ambulatory Visit: Payer: Medicare HMO | Admitting: Student

## 2023-02-24 VITALS — BP 138/100 | HR 67 | Temp 97.8°F | Ht 62.0 in | Wt 215.5 lb

## 2023-02-24 DIAGNOSIS — I5032 Chronic diastolic (congestive) heart failure: Secondary | ICD-10-CM | POA: Diagnosis not present

## 2023-02-24 DIAGNOSIS — I110 Hypertensive heart disease with heart failure: Secondary | ICD-10-CM | POA: Diagnosis not present

## 2023-02-24 DIAGNOSIS — I10 Essential (primary) hypertension: Secondary | ICD-10-CM

## 2023-02-24 DIAGNOSIS — M25551 Pain in right hip: Secondary | ICD-10-CM

## 2023-02-24 MED ORDER — LIDOCAINE 5 % EX PTCH: 1.0000 | MEDICATED_PATCH | Freq: Two times a day (BID) | CUTANEOUS | 0 refills | Status: DC

## 2023-02-24 NOTE — Patient Instructions (Signed)
Thank you, Ms.Benjaman Pott for allowing [place] to provide your care today.   Today we discussed :  For hip pain I have prescribed a lidocaine patch.  2.  For blood pressure, continue chlorthalidone 12.5 mg, losartan 100 mg, and Coreg 12.5 mg twice daily.   I have ordered the following labs for you:  Lab Orders  No laboratory test(s) ordered today       Referrals ordered today:   Referral Orders  No referral(s) requested today     I have ordered the following medication/changed the following medications:   Stop the following medications: There are no discontinued medications.   Start the following medications: No orders of the defined types were placed in this encounter.    Follow up: 3 months   Okay thank you  Should you have any questions or concerns please call the internal medicine clinic at [phone].    Laretta Bolster, MD  [facility]

## 2023-02-25 NOTE — Assessment & Plan Note (Signed)
BP Readings from Last 3 Encounters:  02/24/23 (!) 138/100  02/12/23 (!) 188/91  02/09/23 (!) 160/85   At this office visit, patient brought all his medicines.  She is taking chlorthalidone 12.5 mg daily as prescribed by her cardiologist, in addition to Coreg, losartan and Caudet.  Plan: - Continue losartan, coreg - Continue Caduet (atorva-amlodipine) to reduce pill burden -Continue chlorthalidone 12.5 mg daily  -Follow-up in 3 months.

## 2023-02-25 NOTE — Assessment & Plan Note (Signed)
She is taking Lasix 40 mg every other day.  On my exam, right leg with mild edema> left leg.  No JVD elevation, lungs clear to auscultation bilaterally.   -Not on SGLT2i due to pill burden -Continue Lasix 40 mg every other day.

## 2023-02-27 ENCOUNTER — Telehealth: Payer: Self-pay

## 2023-02-27 NOTE — Telephone Encounter (Signed)
Prior Authorization for patient (Lidoderm 5% patches) came through on cover my meds was submitted with last office notes awaiting approval or denial.  HQI:[id number]

## 2023-02-27 NOTE — Progress Notes (Signed)
Internal Medicine Clinic Attending  I was physically present during the key portions of the resident provided service and participated in the medical decision making of patient's management care. I reviewed pertinent patient test results.  The assessment, diagnosis, and plan were formulated together and I agree with the documentation in the resident's note.  Gust Rung, DO

## 2023-02-27 NOTE — Telephone Encounter (Signed)
Decision:Denied Date: 02/27/2023 Enrollee's name: [name] Member Number: [id number]  Your request was denied We have denied coverage or payment under your Medicare Part D benefit for the following  prescription drug(s) that you or your prescriber requested: LIDODERM 5% PATCH  Why did we deny your request? We denied this request under Medicare Part D because: The drug you asked for is not listed in your preferred drug list (formulary). The preferred drug(s),  you may not have tried, are:  lidocaine topical patch

## 2023-03-02 ENCOUNTER — Ambulatory Visit: Payer: Medicare HMO | Attending: Cardiology

## 2023-03-02 NOTE — Progress Notes (Deleted)
Office Visit    Patient Name: [name] Date of Encounter: 03/02/2023  Primary Care Provider:  Mercie Eon, MD Primary Cardiologist:  Little Ishikawa, MD  Chief Complaint    Hypertension  Significant Past Medical History   HLD 3/24 LDL 45 on atorvastatin 40  CHF HFpEF - last admit 3/24, on furosemide  OSA Has CPAP, uses only about once per week          Allergies  Allergen Reactions   Acyclovir And Related    Metoprolol Itching   Food Rash and Other (See Comments)    Pt states that she is allergic to pickles.      History of Present Illness    [name] is a 87 y.o. female patient of Dr Bjorn Pippin, in the office today for hypertension management.  She was most recently seen by Dr. Bjorn Pippin in November, at which time her BP was noted to be 190/82.  He added chlorthalidone 12.5 mg daily as well as potassium 20 mEq daily (potassium was 3.4 at that visit)  Blood Pressure Goal:  130/80  Current Medications:  amlodipine 10 mg every day, carvedilol 25 mg bid, chlorthalidone 12.5 mg every day, losartan 100 mg every day,   Previously tried:    Family Hx:     Social Hx:      Tobacco:  Alcohol:  Caffeine: Diet:      Exercise:   Home BP readings:      Adherence Assessment  Do you ever forget to take your medication? [] Yes [] No  Do you ever skip doses due to side effects? [] Yes [] No  Do you have trouble affording your medicines? [] Yes [] No  Are you ever unable to pick up your medication due to transportation difficulties? [] Yes [] No  Do you ever stop taking your medications because you don't believe they are helping? [] Yes [] No  Do you check your weight daily? [] Yes [] No   Adherence strategy: ***  Barriers to obtaining medications: ***     Accessory Clinical Findings    Lab Results  Component Value Date   CREATININE 1.38 (H) 02/12/2023   BUN 31 (H) 02/12/2023   NA 133 (L) 02/12/2023   K 4.3 02/12/2023   CL 97 (L) 02/12/2023   CO2 28  02/12/2023   Lab Results  Component Value Date   ALT 6 01/25/2023   AST 15 01/25/2023   ALKPHOS 54 01/25/2023   BILITOT 0.5 01/25/2023   Lab Results  Component Value Date   HGBA1C 6.0 (H) 05/19/2022    Home Medications    Current Outpatient Medications  Medication Sig Dispense Refill   acetaminophen-codeine (TYLENOL #3) 300-30 MG tablet TAKE 1 TABLET EVERY 4 HOURS AS NEEDED FOR MODERATE PAIN. 30 tablet 0   albuterol (VENTOLIN HFA) 108 (90 Base) MCG/ACT inhaler INHALE 2 PUFFS INTO THE LUNGS EVERY 6 HOURS AS NEEDED FOR WHEEZING OR SHORTNESS OF BREATH. 1 each 3   amLODipine (NORVASC) 10 MG tablet Take 1 tablet (10 mg total) by mouth daily. 90 tablet 3   amLODipine-atorvastatin (CADUET) 10-40 MG tablet Take 1 tablet by mouth daily. 90 tablet 3   aspirin EC 81 MG tablet Take 81 mg by mouth daily.     atorvastatin (LIPITOR) 40 MG tablet Take 1 tablet (40 mg total) by mouth daily. 90 tablet 3   brimonidine (ALPHAGAN) 0.2 % ophthalmic solution Place 1 drop into the left eye daily.     carvedilol (COREG) 25 MG tablet TAKE 1  TABLET BY MOUTH TWICE DAILY WITH A MEAL 180 tablet 3   chlorthalidone (HYGROTON) 25 MG tablet Take 0.5 tablets (12.5 mg total) by mouth daily. 90 tablet 3   diclofenac Sodium (VOLTAREN) 1 % GEL APPLY 2 GRAMS TOPICALLY 4 TIMES DAILY (Patient taking differently: Apply 2 g topically 4 (four) times daily as needed (Pain).) 700 g 10   dorzolamide (TRUSOPT) 2 % ophthalmic solution Place 1 drop into the left eye 3 (three) times daily. 10 mL 3   fluticasone furoate-vilanterol (BREO ELLIPTA) 200-25 MCG/ACT AEPB Inhale 1 puff into the lungs daily. 180 each 3   furosemide (LASIX) 40 MG tablet Take 40 mg  tablet by mouth  as needed  daily ,for weight gain of 3 lbs overnight or 5 lbs in a week 30 tablet 6   latanoprost (XALATAN) 0.005 % ophthalmic solution Place 1 drop into the left eye daily.     lidocaine (LIDODERM) 5 % Place 1 patch onto the skin every 12 (twelve) hours. Remove &  Discard patch within 12 hours or as directed by MD 10 patch 0   losartan (COZAAR) 100 MG tablet TAKE 1 TABLET EVERY DAY 90 tablet 3   Magnesium Oxide 400 MG CAPS Take 400 mg daily 90 capsule 3   pantoprazole (PROTONIX) 40 MG tablet Take 1 tablet (40 mg total) by mouth daily. 90 tablet 3   potassium chloride SA (KLOR-CON M) 20 MEQ tablet Take 1 tablet (20 mEq total) by mouth daily. 90 tablet 3   No current facility-administered medications for this visit.     No BP recorded.  {Refresh Note OR Click here to enter BP  :1}***   Assessment & Plan    No problem-specific Assessment & Plan notes found for this encounter.   Phillips Hay PharmD CPP [facility]  [address] [place], [place] [address] [phone]

## 2023-03-13 ENCOUNTER — Other Ambulatory Visit: Payer: Self-pay | Admitting: Internal Medicine

## 2023-03-17 ENCOUNTER — Telehealth: Payer: Self-pay | Admitting: Cardiology

## 2023-03-17 DIAGNOSIS — I10 Essential (primary) hypertension: Secondary | ICD-10-CM

## 2023-03-17 NOTE — Telephone Encounter (Signed)
 Pt c/o medication issue:  1. Name of Medication: chlorthalidone  (HYGROTON ) 25 MG tablet   2. How are you currently taking this medication (dosage and times per day)?    3. Are you having a reaction (difficulty breathing--STAT)? no  4. What is your medication issue? Patient calling because she states the pills are too small to cut in half. Please advise

## 2023-03-17 NOTE — Telephone Encounter (Signed)
 Her kidney function bumped when chlorthalidone was added so I dont want to increase to a full tab. Please have patient stop chlorthalidone. Start hydralazine 25mg  TID. Keep pharmacist appointment in Feb.

## 2023-03-20 ENCOUNTER — Other Ambulatory Visit: Payer: Self-pay

## 2023-03-20 ENCOUNTER — Other Ambulatory Visit: Payer: Self-pay | Admitting: Internal Medicine

## 2023-03-20 DIAGNOSIS — M5441 Lumbago with sciatica, right side: Secondary | ICD-10-CM

## 2023-03-20 MED ORDER — HYDRALAZINE HCL 25 MG PO TABS: 25.0000 mg | ORAL_TABLET | Freq: Three times a day (TID) | ORAL | 1 refills | Status: DC

## 2023-03-20 MED ORDER — ACETAMINOPHEN-CODEINE 300-30 MG PO TABS: ORAL_TABLET | ORAL | 0 refills | Status: DC

## 2023-03-20 NOTE — Addendum Note (Signed)
 Addended by: Marilynn Rail on: 03/20/2023 08:38 AM   Modules accepted: Orders

## 2023-03-20 NOTE — Telephone Encounter (Signed)
 Patient identification verified by 2 forms. Bertina Cooks, RN    Called and spoke to patient  Relayed message below  RN reviewed Rx instruction/education  Patient aware Rx sent to preferred pharmacy  Advised patient to keep 2/11 pharmacy appointment  Patient verbalized understanding, no questions or concerns at this time

## 2023-03-20 NOTE — Telephone Encounter (Signed)
 Pt needs an appointment (April 2025) for further refills.

## 2023-03-22 ENCOUNTER — Other Ambulatory Visit: Payer: Self-pay | Admitting: Internal Medicine

## 2023-04-04 NOTE — Addendum Note (Signed)
Addended by: Derrek Monaco on: 04/04/2023 09:16 AM   Modules accepted: Orders

## 2023-04-18 ENCOUNTER — Ambulatory Visit: Payer: Medicare HMO

## 2023-04-20 ENCOUNTER — Other Ambulatory Visit: Payer: Self-pay

## 2023-04-20 DIAGNOSIS — M5441 Lumbago with sciatica, right side: Secondary | ICD-10-CM

## 2023-04-20 MED ORDER — ACETAMINOPHEN-CODEINE 300-30 MG PO TABS: ORAL_TABLET | ORAL | 0 refills | Status: DC

## 2023-05-11 DIAGNOSIS — H59812 Chorioretinal scars after surgery for detachment, left eye: Secondary | ICD-10-CM | POA: Diagnosis not present

## 2023-05-11 DIAGNOSIS — H43392 Other vitreous opacities, left eye: Secondary | ICD-10-CM | POA: Diagnosis not present

## 2023-05-11 DIAGNOSIS — H2511 Age-related nuclear cataract, right eye: Secondary | ICD-10-CM | POA: Diagnosis not present

## 2023-05-11 DIAGNOSIS — H34832 Tributary (branch) retinal vein occlusion, left eye, with macular edema: Secondary | ICD-10-CM | POA: Diagnosis not present

## 2023-05-21 ENCOUNTER — Other Ambulatory Visit: Payer: Self-pay | Admitting: Cardiology

## 2023-05-25 ENCOUNTER — Other Ambulatory Visit: Payer: Self-pay

## 2023-05-25 DIAGNOSIS — M5441 Lumbago with sciatica, right side: Secondary | ICD-10-CM

## 2023-05-25 MED ORDER — ACETAMINOPHEN-CODEINE 300-30 MG PO TABS
ORAL_TABLET | ORAL | 0 refills | Status: DC
Start: 2023-05-25 — End: 2023-06-11

## 2023-05-29 NOTE — Progress Notes (Unsigned)
 [facility]: Clinic Note  Subjective:  History of Present Illness: [name] is a 88 y.o. year old female who presents for routine follow up of chronic medical conditions  She's doing well overall.  Still has chronic right hip pain, but finds the voltaren gel & lidocaine patches helpful.  She's noticed low energy during the day for the past couple of months. She is waking up to use the bathroom several times at night. Does not drink water after 7pm. Goes to sleep at 10, often sleeps until 2am, then up several times to pee after that. Taking chlorthalidone 25mg  daily because she can't break the small tabs in half.   We spent lots of time doing a med rec, as she brought her meds with her today:   What she's taking: - Atorvastatin 40 - Coreg 25 BID - Losartan 100mg  daily - Chlorthalidone 25mg  daily (from cards; she can't split in half)  - Lasix 40mg  daily as needed (about 2x/week) - Pantoprazole 40mg  daily  - She's out of Amlodipine, Magnesium (which caused diarrhea), and Potassium    Please refer to Assessment and Plan below for full details in Problem-Based Charting.   Past Medical History:  Patient Active Problem List   Diagnosis Date Noted   Gastroenteritis 08/10/2022   (HFpEF) heart failure with preserved ejection fraction (HCC) 06/15/2022   Nocturnal polyuria 04/27/2022   ILD (interstitial lung disease) (HCC) 01/03/2022   Right shoulder pain 08/24/2021   Moderate aortic stenosis 08/24/2021   Chronic respiratory failure with hypoxia (HCC) 12/16/2020   OSA (obstructive sleep apnea) 12/16/2020   Pruritus 10/21/2020   Elevated hemidiaphragm 03/10/2020   Healthcare maintenance 03/10/2020   Nocturnal hypoxia 03/10/2020   Anemia 04/09/2018   Coronary artery calcification seen on CAT scan 04/21/2017   Enlarged pulmonary artery (HCC) 04/21/2017   Restrictive lung disease 11/22/2016   Borderline diabetes 09/19/2013   GERD (gastroesophageal reflux  disease) 05/19/2011   Hyperlipidemia 05/02/2007   Severe obesity (BMI >= 40) (HCC) 03/20/2006   Chronic venous insufficiency 03/20/2006   Right-sided low back pain with right-sided sciatica 03/20/2006   BREAST CANCER, HX OF 03/20/2006   Essential hypertension 12/13/2005   Osteoarthritis 12/13/2005      Medications:  Current Outpatient Medications:    acetaminophen-codeine (TYLENOL #3) 300-30 MG tablet, TAKE 1 TABLET EVERY 4 HOURS AS NEEDED FOR MODERATE PAIN., Disp: 30 tablet, Rfl: 0   albuterol (VENTOLIN HFA) 108 (90 Base) MCG/ACT inhaler, INHALE 2 PUFFS INTO THE LUNGS EVERY 6 HOURS AS NEEDED FOR WHEEZING OR SHORTNESS OF BREATH., Disp: 1 each, Rfl: 3   amLODipine-atorvastatin (CADUET) 10-40 MG tablet, Take 1 tablet by mouth daily., Disp: 90 tablet, Rfl: 3   aspirin EC 81 MG tablet, Take 81 mg by mouth daily., Disp: , Rfl:    brimonidine (ALPHAGAN) 0.2 % ophthalmic solution, Place 1 drop into the left eye daily., Disp: , Rfl:    carvedilol (COREG) 25 MG tablet, TAKE 1 TABLET BY MOUTH TWICE DAILY WITH A MEAL, Disp: 180 tablet, Rfl: 3   dorzolamide (TRUSOPT) 2 % ophthalmic solution, Place 1 drop into the left eye 3 (three) times daily., Disp: 10 mL, Rfl: 3   fluticasone furoate-vilanterol (BREO ELLIPTA) 200-25 MCG/ACT AEPB, INHALE 1 PUFF INTO THE LUNGS DAILY, Disp: 180 each, Rfl: 2   furosemide (LASIX) 40 MG tablet, TAKE 1 TABLET BY MOUTH DAILY AS NEEDED(FOR WEIGHT GAIN OR 3 LBS OVERNIGHT OR 5 LBS IN WEEK), Disp: 90 tablet, Rfl: 3  GNP DICLOFENAC SODIUM 1 % GEL, APPLY 2 GRAMS TOPICALLY 4 TIMES DAILY, Disp: 700 g, Rfl: 3   latanoprost (XALATAN) 0.005 % ophthalmic solution, Place 1 drop into the left eye daily., Disp: , Rfl:    lidocaine (LIDODERM) 5 %, Place 1 patch onto the skin every 12 (twelve) hours. Remove & Discard patch within 12 hours or as directed by MD, Disp: 10 patch, Rfl: 0   losartan (COZAAR) 100 MG tablet, TAKE 1 TABLET EVERY DAY, Disp: 90 tablet, Rfl: 3   Allergies: Allergies   Allergen Reactions   Acyclovir And Related    Metoprolol Itching   Food Rash and Other (See Comments)    Pt states that she is allergic to pickles.         Objective:   Vitals: Vitals:   05/31/23 0933 05/31/23 1002  BP: (!) 152/82 134/84  Pulse: 78 72  Temp: (!) 97.5 F (36.4 C)   SpO2: 100%      Physical Exam: Physical Exam Constitutional:      Appearance: Normal appearance. She is obese.  Cardiovascular:     Rate and Rhythm: Normal rate and regular rhythm.  Pulmonary:     Effort: Pulmonary effort is normal.     Breath sounds: Normal breath sounds.  Neurological:     Mental Status: She is alert.      Data: Labs, imaging, and micro were reviewed in Epic. Refer to Assessment and Plan below for full details in Problem-Based Charting.  Assessment & Plan:  Essential hypertension - chronic and stable. Office BP of 134/84 is right where I want it to be in this 88yo woman - check BMP today. Final blood pressure plan depends on BMP results. I hope her Cr has improved since last visit  - For now, continue: Chlorthalidone 25mg  daily Losartan 100mg  daily Carvedilol 25mg  BID Amlodipine 10mg  daily (in Caduet)  Hyperlipidemia - Continue Caduet   Chronic respiratory failure with hypoxia (HCC) - chronic and stable - I think this is multifactorial, 2/2 obesity/restrictive lung disease and mild ILD. PFTs are not consistent with COPD or asthma. She is currently euvolemic, so I don't think her chronic HFpEF is contributing to her chronic hypoxia - Continue Breo & Albuterol prn  - continue supplemental O2 at 2L/min    Coronary artery calcification seen on CAT scan - continue aspirin 81mg  daily   GERD (gastroesophageal reflux disease) - Stop PPI, not needed  (HFpEF) heart failure with preserved ejection fraction (HCC) - Continue Lasix 40mg  PO daily as needed for leg swelling, which she currently takes 2x per week - No SGLT2 right now to reduce pill burden    Right-sided low back pain with right-sided sciatica - Continue Tylenol 3, lidocaine patches, voltaren gel       Patient will follow up in 3 months   Mercie Eon, MD

## 2023-05-31 ENCOUNTER — Ambulatory Visit (INDEPENDENT_AMBULATORY_CARE_PROVIDER_SITE_OTHER): Payer: Medicare HMO | Admitting: Internal Medicine

## 2023-05-31 ENCOUNTER — Other Ambulatory Visit: Payer: Self-pay | Admitting: Student

## 2023-05-31 ENCOUNTER — Other Ambulatory Visit: Payer: Self-pay | Admitting: Internal Medicine

## 2023-05-31 ENCOUNTER — Encounter: Payer: Self-pay | Admitting: Internal Medicine

## 2023-05-31 ENCOUNTER — Telehealth: Payer: Self-pay | Admitting: *Deleted

## 2023-05-31 VITALS — BP 134/84 | HR 72 | Temp 97.5°F | Ht 62.0 in | Wt 214.4 lb

## 2023-05-31 DIAGNOSIS — H401123 Primary open-angle glaucoma, left eye, severe stage: Secondary | ICD-10-CM | POA: Diagnosis not present

## 2023-05-31 DIAGNOSIS — M5441 Lumbago with sciatica, right side: Secondary | ICD-10-CM | POA: Diagnosis not present

## 2023-05-31 DIAGNOSIS — I110 Hypertensive heart disease with heart failure: Secondary | ICD-10-CM | POA: Diagnosis not present

## 2023-05-31 DIAGNOSIS — H1045 Other chronic allergic conjunctivitis: Secondary | ICD-10-CM | POA: Diagnosis not present

## 2023-05-31 DIAGNOSIS — K219 Gastro-esophageal reflux disease without esophagitis: Secondary | ICD-10-CM | POA: Diagnosis not present

## 2023-05-31 DIAGNOSIS — R7303 Prediabetes: Secondary | ICD-10-CM

## 2023-05-31 DIAGNOSIS — E538 Deficiency of other specified B group vitamins: Secondary | ICD-10-CM | POA: Diagnosis not present

## 2023-05-31 DIAGNOSIS — E785 Hyperlipidemia, unspecified: Secondary | ICD-10-CM

## 2023-05-31 DIAGNOSIS — I5032 Chronic diastolic (congestive) heart failure: Secondary | ICD-10-CM | POA: Diagnosis not present

## 2023-05-31 DIAGNOSIS — H31092 Other chorioretinal scars, left eye: Secondary | ICD-10-CM | POA: Diagnosis not present

## 2023-05-31 DIAGNOSIS — J849 Interstitial pulmonary disease, unspecified: Secondary | ICD-10-CM

## 2023-05-31 DIAGNOSIS — I10 Essential (primary) hypertension: Secondary | ICD-10-CM | POA: Diagnosis not present

## 2023-05-31 DIAGNOSIS — E7800 Pure hypercholesterolemia, unspecified: Secondary | ICD-10-CM

## 2023-05-31 DIAGNOSIS — H348322 Tributary (branch) retinal vein occlusion, left eye, stable: Secondary | ICD-10-CM | POA: Diagnosis not present

## 2023-05-31 DIAGNOSIS — H25811 Combined forms of age-related cataract, right eye: Secondary | ICD-10-CM | POA: Diagnosis not present

## 2023-05-31 DIAGNOSIS — J9611 Chronic respiratory failure with hypoxia: Secondary | ICD-10-CM

## 2023-05-31 DIAGNOSIS — I2510 Atherosclerotic heart disease of native coronary artery without angina pectoris: Secondary | ICD-10-CM

## 2023-05-31 MED ORDER — LIDOCAINE 5 % EX PTCH: 1.0000 | MEDICATED_PATCH | Freq: Two times a day (BID) | CUTANEOUS | 0 refills | Status: DC

## 2023-05-31 MED ORDER — AMLODIPINE-ATORVASTATIN 10-40 MG PO TABS: 1.0000 | ORAL_TABLET | Freq: Every day | ORAL | 3 refills | Status: DC

## 2023-05-31 NOTE — Telephone Encounter (Signed)
 Will forward to Dr. Lafonda Mosses.

## 2023-05-31 NOTE — Assessment & Plan Note (Signed)
-   Continue Tylenol 3, lidocaine patches, voltaren gel

## 2023-05-31 NOTE — Telephone Encounter (Signed)
 Copied from CRM [id number]. Topic: Clinical - Medication Refill >> May 31, 2023 11:22 AM Corin V wrote: Most Recent Primary Care Visit:  Provider: Laretta Bolster  Department: [facility]  Visit Type: OPEN ESTABLISHED  Date: 02/24/2023  Medication: lidocaine (LIDODERM) 5 %   Has the patient contacted their pharmacy? Yes (Agent: If no, request that the patient contact the pharmacy for the refill. If patient does not wish to contact the pharmacy document the reason why and proceed with request.) (Agent: If yes, when and what did the pharmacy advise?)  Is this the correct pharmacy for this prescription? Yes If no, delete pharmacy and type the correct one.  This is the patient's preferred pharmacy:  [facility] [address] [place] - [address]  [place] [address] Phone: [phone] Fax: [phone]   Has the prescription been filled recently? No  Is the patient out of the medication? Yes  Has the patient been seen for an appointment in the last year OR does the patient have an upcoming appointment? Yes  Can we respond through MyChart? No  Agent: Please be advised that Rx refills may take up to 3 business days. We ask that you follow-up with your pharmacy.

## 2023-05-31 NOTE — Assessment & Plan Note (Signed)
-  Continue Caduet.

## 2023-05-31 NOTE — Addendum Note (Signed)
 Addended by: Derrek Monaco on: 05/31/2023 02:44 PM   Modules accepted: Orders

## 2023-05-31 NOTE — Telephone Encounter (Signed)
 Duplicate request, lidocaine prescription refilled today. Closing encounter.

## 2023-05-31 NOTE — Assessment & Plan Note (Signed)
-   chronic and stable. Office BP of 134/84 is right where I want it to be in this 88yo woman - check BMP today. Final blood pressure plan depends on BMP results. I hope her Cr has improved since last visit  - For now, continue: Chlorthalidone 25mg  daily Losartan 100mg  daily Carvedilol 25mg  BID Amlodipine 10mg  daily (in Caduet)

## 2023-05-31 NOTE — Assessment & Plan Note (Signed)
continue aspirin 81mg  daily.

## 2023-05-31 NOTE — Assessment & Plan Note (Addendum)
-   chronic and stable - I think this is multifactorial, 2/2 obesity/restrictive lung disease and mild ILD. PFTs are not consistent with COPD or asthma. She is currently euvolemic, so I don't think her chronic HFpEF is contributing to her chronic hypoxia - Continue Breo & Albuterol prn  - continue supplemental O2 at 2L/min

## 2023-05-31 NOTE — Assessment & Plan Note (Signed)
-   Continue Lasix 40mg  PO daily as needed for leg swelling, which she currently takes 2x per week - No SGLT2 right now to reduce pill burden

## 2023-05-31 NOTE — Assessment & Plan Note (Signed)
-   Stop PPI, not needed

## 2023-05-31 NOTE — Patient Instructions (Addendum)
 Thank you, Ms.Benjaman Pott for allowing [place] to provide your care today. Today we discussed a few things: Your medicines (see below) Your right hip pain - continue using your voltaren gel and lidocaine patches for this Low energy - I am checking some blood work and will call you with the results. I worry that you having to get up to use the bathroom several times every night is making you more tired during the day.     I have ordered the following labs for you:  Lab Orders         BMP8+Anion Gap         Hemoglobin A1c         Vitamin B12         Magnesium      Tests ordered today:  None  Referrals ordered today:   Referral Orders  No referral(s) requested today     I have ordered the following medication/changed the following medications:   Stop the following medications: Medications Discontinued During This Encounter  Medication Reason   amLODipine (NORVASC) 10 MG tablet    amLODipine-atorvastatin (CADUET) 10-40 MG tablet Reorder   hydrALAZINE (APRESOLINE) 25 MG tablet    pantoprazole (PROTONIX) 40 MG tablet    potassium chloride SA (KLOR-CON M) 20 MEQ tablet    Magnesium Oxide 400 MG CAPS      Start the following medications: Meds ordered this encounter  Medications   amLODipine-atorvastatin (CADUET) 10-40 MG tablet    Sig: Take 1 tablet by mouth daily.    Dispense:  90 tablet    Refill:  3     Return in about 3 months (around 08/31/2023) for Chronic medical conditions.    Remember:  You will be on 5 medicines to take every day: Aspirin 81mg  daily - to prevent a stroke or heart attack  Carvedilol 25mg  twice daily - for blood pressure  Losartan 100mg  daily - for blood pressure Caduet (Amlodipine/Atorvastatin) - for cholesterol & blood pressure - I have sent this to your mail order pharmacy  Chlorthalidone 25mg  daily - Your Cardiologist had prescribed 12.5mg  daily (half a tab), but you've been taking 25mg  daily (a whole tab) since you can't break them in half. I'll  check your labs today, then call you to let you know what to do with this medicine.   Should you have any questions or concerns please call the internal medicine clinic at [phone].     Mercie Eon, MD Faculty, Internal Medicine Teaching [facility]

## 2023-06-01 ENCOUNTER — Telehealth: Payer: Self-pay

## 2023-06-01 LAB — BMP8+ANION GAP
Anion Gap: 12 mmol/L (ref 10.0–18.0)
BUN/Creatinine Ratio: 21 (ref 12–28)
BUN: 23 mg/dL (ref 8–27)
CO2: 30 mmol/L — ABNORMAL HIGH (ref 20–29)
Calcium: 8.9 mg/dL (ref 8.7–10.3)
Chloride: 93 mmol/L — ABNORMAL LOW (ref 96–106)
Creatinine, Ser: 1.08 mg/dL — ABNORMAL HIGH (ref 0.57–1.00)
Glucose: 105 mg/dL — ABNORMAL HIGH (ref 70–99)
Potassium: 4 mmol/L (ref 3.5–5.2)
Sodium: 135 mmol/L (ref 134–144)
eGFR: 50 mL/min/{1.73_m2} — ABNORMAL LOW (ref >59)

## 2023-06-01 LAB — HEMOGLOBIN A1C
Est. average glucose Bld gHb Est-mCnc: 126 mg/dL
Hgb A1c MFr Bld: 6 % — ABNORMAL HIGH (ref 4.8–5.6)

## 2023-06-01 LAB — MAGNESIUM: Magnesium: 1.9 mg/dL (ref 1.6–2.3)

## 2023-06-01 LAB — VITAMIN B12: Vitamin B-12: 562 pg/mL (ref 232–1245)

## 2023-06-01 NOTE — Telephone Encounter (Signed)
 Prior Authorization for patient (Lidocaine 5% patches) came through on cover my meds was submitted with last office notes awaiting approval or denial.  KEY:[id number]

## 2023-06-01 NOTE — Telephone Encounter (Signed)
 [name] (Key: [id number]) PA Case ID #: [id number] Rx #: [id number] Need Help? Call us at [phone] Outcome Approved today by [facility] NCPDP 2017 PA Case: [id number], Status: Approved, Coverage Starts on: 03/08/2023 12:00:00 AM, Coverage Ends on: 03/06/2024 12:00:00 AM. Questions? Contact [phone]. 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

## 2023-06-02 ENCOUNTER — Encounter: Payer: Self-pay | Admitting: Pharmacist

## 2023-06-02 ENCOUNTER — Ambulatory Visit: Payer: Medicare HMO | Attending: Cardiovascular Disease | Admitting: Pharmacist

## 2023-06-02 VITALS — BP 163/90 | HR 71

## 2023-06-02 DIAGNOSIS — I10 Essential (primary) hypertension: Secondary | ICD-10-CM

## 2023-06-02 DIAGNOSIS — I5032 Chronic diastolic (congestive) heart failure: Secondary | ICD-10-CM | POA: Diagnosis not present

## 2023-06-02 MED ORDER — HYDRALAZINE HCL 25 MG PO TABS: 25.0000 mg | ORAL_TABLET | Freq: Three times a day (TID) | ORAL | 2 refills | Status: DC

## 2023-06-02 NOTE — Patient Instructions (Addendum)
 It was nice meeting you today  We would like your blood pressure to be less than 130/80  Please continue your amlodipine-atorvastatin pill every day Please continue your carvedilol 25mg  twice a day  We will start a new medication called hydralazine 25mg  three times a day  Please continue to check your blood pressure at home  Please call us with any questions   Laural Golden, PharmD, BCACP, CDCES, CPP [address] [place], [place], [address] Phone: [phone], Fax: [phone]

## 2023-06-02 NOTE — Progress Notes (Signed)
 Patient ID: [name]                 DOB: [birth year]                      MRN: [id number]     HPI: [name] is a 88 y.o. female referred by Dr. Bjorn Pippin to HTN clinic. PMH is significant for HTN, CAD, HFpEF, COPD on oxygen, and obesity.   Patient presents today for HTN management. Last seen by Dr Bjorn Pippin in [facility] 2024. Recommended follow up with PharmD but this did not happen.  Patient called clinic in January 2025 and reported she was having issues cutting her chlorthalidone 25mg  in half. Chlorthalidone was discontinued and hydralazine was started. However she did not pick this up and did not know it was sent.  Patient reports she feels well. Is in wheelchair on o2. Reports no chest pain or swelling. Has SOB when moving if she is not wearing her oxygen.   Checks blood pressure at home but did not bring cuff or log. Does not remember any home readings but says they are typically <140.  Does not add salt to food. Has arthritis pain in hip.  Had PCP appointment on 3/26 and BP was 134/84 in room.  Current HTN meds:  Furosemide 40mg  daily Amlodpine-atorvastatin 10-40mg  daily Carvedilol 25mg  BID  Wt Readings from Last 3 Encounters:  05/31/23 214 lb 6.4 oz (97.3 kg)  02/24/23 215 lb 8 oz (97.8 kg)  02/11/23 211 lb (95.7 kg)   BP Readings from Last 3 Encounters:  06/02/23 (!) 163/90  05/31/23 134/84  02/24/23 (!) 138/100   Pulse Readings from Last 3 Encounters:  06/02/23 71  05/31/23 72  02/24/23 67    Renal function: Estimated Creatinine Clearance: 41.5 mL/min (A) (by C-G formula based on SCr of 1.08 mg/dL (H)).  Past Medical History:  Diagnosis Date   Allergic rhinitis, cause unspecified    Asthma    B12 deficiency 06/15/2022   Cancer (HCC)    CHF exacerbation (HCC) 05/08/2022   Chronic back pain    Coronary artery calcification seen on CAT scan 04/21/2017   Enlarged pulmonary artery (HCC) 04/21/2017   By chest CT   Frequent urination 11/14/2017   GERD  (gastroesophageal reflux disease) 05/19/2011   history of Bilateral leg edema-likely amlodipine induced. 07/28/2011   history of CAP (community acquired pneumonia) 07/14/2011   history of HYPOTHYROIDISM, BORDERLINE 03/20/2006   Annotation: Asymptomatic, untreated Qualifier: Diagnosis of  By: Candis Musa MD, Ruben Reason.    Hx of breast cancer    Hyperlipidemia    Hypertension    Hypothyroidism    Hypothyroidism 03/20/2006   Annotation: Asymptomatic, untreated  Qualifier: Diagnosis of   By: Candis Musa MD, Ruben Reason.      Lumbago    Murmur, cardiac 11/22/2016   OBESITY NOS 03/20/2006   Qualifier: Diagnosis of  By: Candis Musa MD, Veronique D.    OSTEOARTHRITIS 12/13/2005   Annotation: bilateral knees;right knee injection 6/05 Qualifier: Diagnosis of  By: Candis Musa MD, Ruben Reason.    Pain, dental 09/18/2020   Preventative health care 09/04/2014   Pulmonary nodule 05/30/2017   4 mm right middle lobe nodule noted on high-resolution CT done on February 06, 2017   right shoulder pain, likely Impingement syndrome  09/09/2010   Upper respiratory infection 04/09/2018   VENOUS INSUFFICIENCY, CHRONIC 03/20/2006   Qualifier: Diagnosis of  By: Candis Musa MD, Ruben Reason.     Current Outpatient  Medications on File Prior to Visit  Medication Sig Dispense Refill   acetaminophen-codeine (TYLENOL #3) 300-30 MG tablet TAKE 1 TABLET EVERY 4 HOURS AS NEEDED FOR MODERATE PAIN. 30 tablet 0   albuterol (VENTOLIN HFA) 108 (90 Base) MCG/ACT inhaler INHALE 2 PUFFS INTO THE LUNGS EVERY 6 HOURS AS NEEDED FOR WHEEZING OR SHORTNESS OF BREATH. 1 each 3   amLODipine-atorvastatin (CADUET) 10-40 MG tablet Take 1 tablet by mouth daily. 90 tablet 3   aspirin EC 81 MG tablet Take 81 mg by mouth daily.     brimonidine (ALPHAGAN) 0.2 % ophthalmic solution Place 1 drop into the left eye daily.     carvedilol (COREG) 25 MG tablet TAKE 1 TABLET BY MOUTH TWICE DAILY WITH A MEAL 180 tablet 3   dorzolamide (TRUSOPT) 2 % ophthalmic  solution Place 1 drop into the left eye 3 (three) times daily. 10 mL 3   fluticasone furoate-vilanterol (BREO ELLIPTA) 200-25 MCG/ACT AEPB INHALE 1 PUFF INTO THE LUNGS DAILY 180 each 2   furosemide (LASIX) 40 MG tablet TAKE 1 TABLET BY MOUTH DAILY AS NEEDED(FOR WEIGHT GAIN OR 3 LBS OVERNIGHT OR 5 LBS IN WEEK) 90 tablet 3   GNP DICLOFENAC SODIUM 1 % GEL APPLY 2 GRAMS TOPICALLY 4 TIMES DAILY 700 g 3   latanoprost (XALATAN) 0.005 % ophthalmic solution Place 1 drop into the left eye daily.     lidocaine (LIDODERM) 5 % Place 1 patch onto the skin every 12 (twelve) hours. Remove & Discard patch within 12 hours or as directed by MD 10 patch 0   losartan (COZAAR) 100 MG tablet TAKE 1 TABLET EVERY DAY 90 tablet 3   No current facility-administered medications on file prior to visit.    Allergies  Allergen Reactions   Acyclovir And Related    Metoprolol Itching   Food Rash and Other (See Comments)    Pt states that she is allergic to pickles.       Assessment/Plan:  1. Hypertension -  HYPERTENSION CONTROL Vitals:   06/02/23 1047 06/02/23 1048  BP: (!) 162/113 (!) 163/90    The patient's blood pressure is elevated above target today.  In order to address the patient's elevated BP: A new medication was prescribed today.    Patient BP in room 163/90 which is above goal of <130/80. BP well controlled 2 days ago at PCP. Unclear what home readingsa are. Will attempt to restart hydralazine 25mg  TID again. Counseled patient on administration and possible side effects. Patient voiced understanding. Will set up patient for follow up with Dr Bjorn Pippin since she was supposed to be seen 3 months after last visit.  Continue:  Furosemide 40mg  daily Amlodpine-atorvastatin 10-40mg  daily Carvedilol 25mg  BID Start hydralazine 25mg  TID F/u with Dr Grant Ruts, PharmD, BCACP, CDCES, CPP [address] [place], [place], [address] Phone: [phone], Fax: [phone]

## 2023-06-09 ENCOUNTER — Telehealth: Payer: Self-pay | Admitting: *Deleted

## 2023-06-09 NOTE — Telephone Encounter (Signed)
 Copied from CRM [id number]. Topic: Clinical - Medical Advice >> Jun 09, 2023  9:24 AM Everette Rank wrote: Reason for CRM: amLODipine-atorvastatin (CADUET) 10-40 MG tablet-Patient stated since taking for 3 days been having headaches. Will stop taking till PCP calls for instructed for patient. Needs call back  [phone]

## 2023-06-10 ENCOUNTER — Other Ambulatory Visit: Payer: Self-pay

## 2023-06-10 ENCOUNTER — Emergency Department (HOSPITAL_COMMUNITY)

## 2023-06-10 ENCOUNTER — Encounter (HOSPITAL_COMMUNITY): Payer: Self-pay

## 2023-06-10 ENCOUNTER — Emergency Department (HOSPITAL_COMMUNITY)
Admission: EM | Admit: 2023-06-10 | Discharge: 2023-06-11 | Disposition: A | Discharge status: Home / Self Care | Attending: Emergency Medicine | Admitting: Emergency Medicine

## 2023-06-10 DIAGNOSIS — M25559 Pain in unspecified hip: Secondary | ICD-10-CM | POA: Diagnosis not present

## 2023-06-10 DIAGNOSIS — T84020A Dislocation of internal right hip prosthesis, initial encounter: Secondary | ICD-10-CM | POA: Diagnosis not present

## 2023-06-10 DIAGNOSIS — X501XXA Overexertion from prolonged static or awkward postures, initial encounter: Secondary | ICD-10-CM | POA: Diagnosis not present

## 2023-06-10 DIAGNOSIS — S73004A Unspecified dislocation of right hip, initial encounter: Secondary | ICD-10-CM | POA: Insufficient documentation

## 2023-06-10 DIAGNOSIS — I10 Essential (primary) hypertension: Secondary | ICD-10-CM | POA: Diagnosis not present

## 2023-06-10 DIAGNOSIS — S79911A Unspecified injury of right hip, initial encounter: Secondary | ICD-10-CM | POA: Diagnosis present

## 2023-06-10 DIAGNOSIS — Z96641 Presence of right artificial hip joint: Secondary | ICD-10-CM | POA: Diagnosis not present

## 2023-06-10 MED ORDER — FENTANYL CITRATE PF 50 MCG/ML IJ SOSY
50.0000 ug | PREFILLED_SYRINGE | Freq: Once | INTRAMUSCULAR | Status: DC
  Filled 2023-06-10: qty 1

## 2023-06-10 MED ORDER — FENTANYL CITRATE PF 50 MCG/ML IJ SOSY
50.0000 ug | PREFILLED_SYRINGE | INTRAMUSCULAR | Status: AC | PRN
  Administered 2023-06-10: 50 ug via INTRAVENOUS
  Filled 2023-06-10: qty 1

## 2023-06-10 NOTE — ED Triage Notes (Signed)
 Pt coming from home via EMS, pt sitting down into chair when she felt right hip popped (mild shortening and internal rotation) (hip replacement in 2007 - popped out 4 times total since). fentanyl given en route. 10/10 pain initially, 3/10 now. 146/85, 100% on 2L [place] (Hx of COPD). 2L [place] at baseline.

## 2023-06-10 NOTE — ED Provider Notes (Signed)
 [facility] AT [facility] Provider Note   CSN: [id number] Arrival date & time: 06/10/23  2241     History {Add pertinent medical, surgical, social history, OB history to HPI:1} Chief Complaint  Patient presents with   Hip Injury    [name] is a 88 y.o. female.  The history is provided by the patient.  Patient with history of chronic respiratory failure on oxygen presents with hip pain.  Patient has a distant history of right hip replacement.  Tonight when she was changing positions in the chair she felt her right hip pop out of place.  She has had this happen multiple times previously and had to be reduced in the emergency department. She has otherwise been at her baseline.  She denies any other acute complaints     Home Medications Prior to Admission medications   Medication Sig Start Date End Date Taking? Authorizing Provider  acetaminophen-codeine (TYLENOL #3) 300-30 MG tablet TAKE 1 TABLET EVERY 4 HOURS AS NEEDED FOR MODERATE PAIN. 05/25/23   Mercie Eon, MD  albuterol (VENTOLIN HFA) 108 (90 Base) MCG/ACT inhaler INHALE 2 PUFFS INTO THE LUNGS EVERY 6 HOURS AS NEEDED FOR WHEEZING OR SHORTNESS OF BREATH. 03/04/22   Mercie Eon, MD  amLODipine-atorvastatin (CADUET) 10-40 MG tablet Take 1 tablet by mouth daily. 05/31/23 05/30/24  Mercie Eon, MD  aspirin EC 81 MG tablet Take 81 mg by mouth daily.    [provider]  brimonidine (ALPHAGAN) 0.2 % ophthalmic solution Place 1 drop into the left eye daily. 07/20/21   [provider]  carvedilol (COREG) 25 MG tablet TAKE 1 TABLET BY MOUTH TWICE DAILY WITH A MEAL 10/31/22   Mercie Eon, MD  dorzolamide (TRUSOPT) 2 % ophthalmic solution Place 1 drop into the left eye 3 (three) times daily. 06/17/22 06/17/23  Mercie Eon, MD  fluticasone furoate-vilanterol (BREO ELLIPTA) 200-25 MCG/ACT AEPB INHALE 1 PUFF INTO THE LUNGS DAILY 03/20/23   Glenford Bayley, NP  furosemide (LASIX) 40 MG  tablet TAKE 1 TABLET BY MOUTH DAILY AS NEEDED(FOR WEIGHT GAIN OR 3 LBS OVERNIGHT OR 5 LBS IN WEEK) 05/23/23   Little Ishikawa, MD  GNP DICLOFENAC SODIUM 1 % GEL APPLY 2 GRAMS TOPICALLY 4 TIMES DAILY 03/14/23   Mercie Eon, MD  hydrALAZINE (APRESOLINE) 25 MG tablet Take 1 tablet (25 mg total) by mouth 3 (three) times daily. 06/02/23   Little Ishikawa, MD  latanoprost (XALATAN) 0.005 % ophthalmic solution Place 1 drop into the left eye daily. 06/26/21   [provider]  lidocaine (LIDODERM) 5 % Place 1 patch onto the skin every 12 (twelve) hours. Remove & Discard patch within 12 hours or as directed by MD 05/31/23   Mercie Eon, MD  losartan (COZAAR) 100 MG tablet TAKE 1 TABLET EVERY DAY 11/28/22   Mercie Eon, MD      Allergies    Acyclovir and related, Metoprolol, and Food    Review of Systems   Review of Systems  Musculoskeletal:  Positive for arthralgias.    Physical Exam Updated Vital Signs BP (!) 177/92 (BP Location: Right Arm)   Pulse 74   Temp 98.2 F (36.8 C)   Resp 18   Ht 1.575 m (5\' 2" )   Wt 95.7 kg   SpO2 98%   BMI 38.59 kg/m  Physical Exam CONSTITUTIONAL: Elderly, no acute distress HEAD: Normocephalic/atraumatic ENMT: Mucous membranes moist CV: S1/S2 noted, no murmurs/rubs/gallops noted LUNGS: Lungs are clear to auscultation  bilaterally, no apparent distress ABDOMEN: soft, nontender NEURO: Pt is awake/alert/appropriate, moves all extremitiesx4.  No facial droop.   EXTREMITIES: Right lower extremity is shortened and internally rotated.  Distal pulses in tact.  Full range of motion left lower EXTR without difficulty.  No tenderness to the right knee or ankle. SKIN: warm, color normal PSYCH: no abnormalities of mood noted, alert and oriented to situation  ED Results / Procedures / Treatments   Labs (all labs ordered are listed, but only abnormal results are displayed) Labs Reviewed - No data to display  EKG None  Radiology DG Hip  Unilat W or Wo Pelvis 2-3 Views Right Result Date: 06/10/2023 CLINICAL DATA:  Right hip dislocation. EXAM: DG HIP (WITH OR WITHOUT PELVIS) 2-3V RIGHT COMPARISON:  Study of 02/12/2023 pre and post right hip dislocation reduction. FINDINGS: Three views including AP pelvis, dedicated right hip views. Right hip arthroplasty is again noted with superior dislocation of the prosthetic femoral head from the acetabular cup prosthesis. Evidence of fractures is not seen. No pelvic fracture or diastasis is evident. Unremarkable left hip. IMPRESSION: Superior dislocation of the right hip prosthesis. No evidence of fractures. Electronically Signed   By: Almira Bar M.D.   On: 06/10/2023 23:17    Procedures .Sedation  Date/Time: 06/10/2023 11:51 PM  Performed by: Zadie Rhine, MD Authorized by: Zadie Rhine, MD   Consent:    Consent obtained:  Written   Consent given by:  Patient   Risks discussed:  Respiratory compromise necessitating ventilatory assistance and intubation and inadequate sedation Universal protocol:    Immediately prior to procedure, a time out was called: yes     Patient identity confirmed:  Verbally with patient and provided demographic data Indications:    Procedure performed:  Dislocation reduction   Procedure necessitating sedation performed by:  Physician performing sedation Pre-sedation assessment:    ASA classification: class 3 - patient with severe systemic disease     Mouth opening:  3 or more finger widths   Mallampati score:  II - soft palate, uvula, fauces visible   Neck mobility: reduced     Pre-sedation assessments completed and reviewed: airway patency and mental status   A pre-sedation assessment was completed prior to the start of the procedure .Ortho Injury Treatment  Date/Time: 06/10/2023 11:52 PM  Performed by: Zadie Rhine, MD Authorized by: Zadie Rhine, MD      {Document cardiac monitor, telemetry assessment procedure when  appropriate:1}  Medications Ordered in ED Medications  fentaNYL (SUBLIMAZE) injection 50 mcg (50 mcg Intravenous Given 06/10/23 2317)    ED Course/ Medical Decision Making/ A&P   {   Click here for ABCD2, HEART and other calculatorsREFRESH Note before signing :1}                              Medical Decision Making  ***  {Document critical care time when appropriate:1} {Document review of labs and clinical decision tools ie heart score, Chads2Vasc2 etc:1}  {Document your independent review of radiology images, and any outside records:1} {Document your discussion with family members, caretakers, and with consultants:1} {Document social determinants of health affecting pt's care:1} {Document your decision making why or why not admission, treatments were needed:1} Final Clinical Impression(s) / ED Diagnoses Final diagnoses:  None    Rx / DC Orders ED Discharge Orders     None

## 2023-06-11 ENCOUNTER — Emergency Department (HOSPITAL_COMMUNITY)

## 2023-06-11 DIAGNOSIS — T84020A Dislocation of internal right hip prosthesis, initial encounter: Secondary | ICD-10-CM | POA: Diagnosis not present

## 2023-06-11 MED ORDER — HYDROCODONE-ACETAMINOPHEN 5-325 MG PO TABS: 1.0000 | ORAL_TABLET | Freq: Four times a day (QID) | ORAL | 0 refills | Status: DC | PRN

## 2023-06-11 MED ORDER — PROPOFOL 10 MG/ML IV BOLUS
INTRAVENOUS | Status: AC | PRN
  Administered 2023-06-11: 40 mg via INTRAVENOUS
  Administered 2023-06-11 (×2): 20 mg via INTRAVENOUS

## 2023-06-11 MED ORDER — PROPOFOL 10 MG/ML IV BOLUS
40.0000 mg | Freq: Once | INTRAVENOUS | Status: AC
Start: 2023-06-11 — End: 2023-06-11
  Administered 2023-06-11: 40 mg via INTRAVENOUS
  Filled 2023-06-11: qty 20

## 2023-06-11 NOTE — Progress Notes (Signed)
 RT at patient bedside for conscious sedation. Ambu bag on standby, vital signs are stable. End tidal [place] with 5 liters oxygen bleed in. Patient tolerated well.

## 2023-06-12 NOTE — Telephone Encounter (Signed)
 I talked to pt who stated she stopped Caduet on Thursday or Friday. She has not kept a record of her BP's but stated it's ok until she move around and "it's a little high". I asked pt to check her BP and let us know. She went to the ER for her hip; stated her hip keeps popping out of socket; requested Dr Audrie Lia office # stated he did her surgery (phone# given).

## 2023-06-23 ENCOUNTER — Telehealth: Payer: Self-pay | Admitting: Internal Medicine

## 2023-06-23 NOTE — Telephone Encounter (Signed)
 Called patient to check in. She is feeling much better - no dizziness, lightheadedness, chest pain, or headaches. She is talking all BP meds including the amlodipine -atorvastatin . The batteries in her BP cuff died, so she is going to ask her son to replace them.   Her main concern is hip pain, and she's seeing Dr Julio Ohm for this soon.  I offered her an appointment next week, but since she's feeling better, she would prefer to wait for her planned 30-month follow up.  She will call us  if symptoms return or if blood pressure is abnormal on home checks.

## 2023-06-29 ENCOUNTER — Ambulatory Visit (INDEPENDENT_AMBULATORY_CARE_PROVIDER_SITE_OTHER): Admitting: Orthopedic Surgery

## 2023-06-29 DIAGNOSIS — M25551 Pain in right hip: Secondary | ICD-10-CM

## 2023-06-29 MED ORDER — ACETAMINOPHEN-CODEINE 300-30 MG PO TABS: 1.0000 | ORAL_TABLET | Freq: Three times a day (TID) | ORAL | 0 refills | Status: DC | PRN

## 2023-07-18 ENCOUNTER — Other Ambulatory Visit: Payer: Self-pay

## 2023-07-18 MED ORDER — FUROSEMIDE 40 MG PO TABS: ORAL_TABLET | ORAL | 1 refills | Status: DC

## 2023-07-19 ENCOUNTER — Encounter: Payer: Self-pay | Admitting: Orthopedic Surgery

## 2023-07-19 NOTE — Progress Notes (Addendum)
 Office Visit Note   Patient: [name]           Date of Birth: July 02, 1935           MRN: [id number] Visit Date: 06/29/2023              Requested by: Driscilla George, MD [address] [place],  [place] [address] PCP: Driscilla George, MD  Chief Complaint  Patient presents with   Right Hip - Pain      HPI: Patient is an 87 year old woman who states that her right hip is popping out of place.  Patient has dislocated her right total hip arthroplasty on December 8 and most recently April 5.  Patient is approximately 20 years status post right total hip arthroplasty and index surgery.  Assessment & Plan: Visit Diagnoses:  1. Pain in right hip     Plan: Recommended taking Aleve twice a day to help with her symptoms.  Patient states that she does not want to consider revision of the total hip arthroplasty.  A prescription was sent for Tylenol  3 which she routinely uses for pain.  Follow-Up Instructions: Return if symptoms worsen or fail to improve.   Ortho Exam  Patient is alert, oriented, no adenopathy, well-dressed, normal affect, normal respiratory effort. Examination patient is on nasal cannula oxygen  24 hours a day.  She has no pain with passive range of motion of the right hip.  Radiographs show a reduced total hip arthroplasty.  Imaging: No results found. No images are attached to the encounter.  Labs: Lab Results  Component Value Date   HGBA1C 6.0 (H) 05/31/2023   HGBA1C 6.0 (H) 05/19/2022   HGBA1C 5.6 08/24/2021   ESRSEDRATE 44 (H) 09/09/2010   ESRSEDRATE 52 (H) 05/22/2008   ESRSEDRATE 73 (H) 03/26/2008   CRP 0.7 (H) 09/09/2010   REPTSTATUS 11/01/2013 FINAL 10/31/2013   CULT NO GROWTH Performed at [facility] 10/31/2013     Lab Results  Component Value Date   ALBUMIN 3.7 01/25/2023   ALBUMIN 3.5 03/31/2018   ALBUMIN 3.2 (L) 11/13/2013    Lab Results  Component Value Date   MG 1.9 05/31/2023   MG 2.1 02/09/2023   MG 1.6 01/25/2023    No results found for: "VD25OH"  No results found for: "PREALBUMIN"    Latest Ref Rng & Units 02/12/2023   12:02 AM 05/08/2022    7:12 AM 05/07/2022    6:50 PM  CBC EXTENDED  WBC 4.0 - 10.5 K/uL 8.6  6.1  7.0   RBC 3.87 - 5.11 MIL/uL 3.57  3.96  3.89   Hemoglobin 12.0 - 15.0 g/dL 19.1  47.8  29.5   HCT 36.0 - 46.0 % 31.3  33.1  33.8   Platelets 150 - 400 K/uL 204  197  134   NEUT# 1.7 - 7.7 K/uL 5.6   4.4   Lymph# 0.7 - 4.0 K/uL 1.5   1.6      There is no height or weight on file to calculate BMI.  Orders:  No orders of the defined types were placed in this encounter.  Meds ordered this encounter  Medications   acetaminophen -codeine  (TYLENOL  #3) 300-30 MG tablet    Sig: Take 1 tablet by mouth every 8 (eight) hours as needed for moderate pain (pain score 4-6).    Dispense:  30 tablet    Refill:  0     Procedures: No procedures performed  Clinical Data: No additional  findings.  ROS:  All other systems negative, except as noted in the HPI. Review of Systems  Objective: Vital Signs: There were no vitals taken for this visit.  Specialty Comments:  No specialty comments available.  PMFS History: Patient Active Problem List   Diagnosis Date Noted   Gastroenteritis 08/10/2022   (HFpEF) heart failure with preserved ejection fraction (HCC) 06/15/2022   Nocturnal polyuria 04/27/2022   ILD (interstitial lung disease) (HCC) 01/03/2022   Right shoulder pain 08/24/2021   Moderate aortic stenosis 08/24/2021   Chronic respiratory failure with hypoxia (HCC) 12/16/2020   OSA (obstructive sleep apnea) 12/16/2020   Pruritus 10/21/2020   Elevated hemidiaphragm 03/10/2020   Healthcare maintenance 03/10/2020   Nocturnal hypoxia 03/10/2020   Anemia 04/09/2018   Coronary artery calcification seen on CAT scan 04/21/2017   Enlarged pulmonary artery (HCC) 04/21/2017   Restrictive lung disease 11/22/2016   Borderline diabetes 09/19/2013   GERD (gastroesophageal reflux disease)  05/19/2011   Hyperlipidemia 05/02/2007   Severe obesity (BMI >= 40) (HCC) 03/20/2006   Chronic venous insufficiency 03/20/2006   Right-sided low back pain with right-sided sciatica 03/20/2006   BREAST CANCER, HX OF 03/20/2006   Essential hypertension 12/13/2005   Osteoarthritis 12/13/2005   Past Medical History:  Diagnosis Date   Allergic rhinitis, cause unspecified    Asthma    B12 deficiency 06/15/2022   Cancer (HCC)    CHF exacerbation (HCC) 05/08/2022   Chronic back pain    Coronary artery calcification seen on CAT scan 04/21/2017   Enlarged pulmonary artery (HCC) 04/21/2017   By chest CT   Frequent urination 11/14/2017   GERD (gastroesophageal reflux disease) 05/19/2011   history of Bilateral leg edema-likely amlodipine  induced. 07/28/2011   history of CAP (community acquired pneumonia) 07/14/2011   history of HYPOTHYROIDISM, BORDERLINE 03/20/2006   Annotation: Asymptomatic, untreated Qualifier: Diagnosis of  By: Arlan Labella MD, Rosalyn Combes.    Hx of breast cancer    Hyperlipidemia    Hypertension    Hypothyroidism    Hypothyroidism 03/20/2006   Annotation: Asymptomatic, untreated  Qualifier: Diagnosis of   By: Arlan Labella MD, Rosalyn Combes.      Lumbago    Murmur, cardiac 11/22/2016   OBESITY NOS 03/20/2006   Qualifier: Diagnosis of  By: Arlan Labella MD, Veronique D.    OSTEOARTHRITIS 12/13/2005   Annotation: bilateral knees;right knee injection 6/05 Qualifier: Diagnosis of  By: Arlan Labella MD, Rosalyn Combes.    Pain, dental 09/18/2020   Preventative health care 09/04/2014   Pulmonary nodule 05/30/2017   4 mm right middle lobe nodule noted on high-resolution CT done on February 06, 2017   right shoulder pain, likely Impingement syndrome  09/09/2010   Upper respiratory infection 04/09/2018   VENOUS INSUFFICIENCY, CHRONIC 03/20/2006   Qualifier: Diagnosis of  By: Arlan Labella MD, Rosalyn Combes.     Family History  Problem Relation Age of Onset   Hypertension Mother    Alzheimer's  disease Mother    Diabetes Sister    Colon cancer Neg Hx    Colon polyps Neg Hx    Esophageal cancer Neg Hx    Stomach cancer Neg Hx    Rectal cancer Neg Hx     Past Surgical History:  Procedure Laterality Date   ABDOMINAL HYSTERECTOMY     BREAST LUMPECTOMY     COLONOSCOPY     HERNIA REPAIR     HIP CLOSED REDUCTION Right 11/26/2014   Procedure: CLOSED REDUCTIION RIGHT HIP;  Surgeon: Timothy Ford, MD;  Location:  [facility] OR;  Service: Orthopedics;  Laterality: Right;   INCISIONAL HERNIA REPAIR N/A 10/29/2013   Procedure: OPEN REPAIR OF RECURRENT INCISIONAL HERNIA WITH MESH;  Surgeon: Cloyce Darby, MD;  Location: [facility] OR;  Service: General;  Laterality: N/A;   JOINT REPLACEMENT     TOTAL HIP ARTHROPLASTY     Social History   Occupational History   Occupation: not working  Tobacco Use   Smoking status: Former    Current packs/day: 0.00    Average packs/day: 0.2 packs/day for 0.5 years (0.1 ttl pk-yrs)    Types: Cigarettes    Start date: 03/11/1958    Quit date: 09/09/1958    Years since quitting: 64.9   Smokeless tobacco: Never   Tobacco comments:    social smoker  Vaping Use   Vaping status: Never Used  Substance and Sexual Activity   Alcohol use: No    Alcohol/week: 0.0 standard drinks of alcohol   Drug use: No   Sexual activity: Not Currently

## 2023-08-02 ENCOUNTER — Encounter

## 2023-08-21 ENCOUNTER — Other Ambulatory Visit: Payer: Self-pay

## 2023-08-21 DIAGNOSIS — I10 Essential (primary) hypertension: Secondary | ICD-10-CM

## 2023-08-21 MED ORDER — CARVEDILOL 25 MG PO TABS: ORAL_TABLET | ORAL | 3 refills | Status: AC

## 2023-08-30 ENCOUNTER — Other Ambulatory Visit: Payer: Self-pay | Admitting: Cardiology

## 2023-08-30 DIAGNOSIS — I10 Essential (primary) hypertension: Secondary | ICD-10-CM

## 2023-09-05 ENCOUNTER — Other Ambulatory Visit: Payer: Self-pay | Admitting: Orthopedic Surgery

## 2023-09-11 NOTE — Progress Notes (Deleted)
 Cardiology Office Note:    Date:  09/11/2023   ID:  [name], DOB 11-Jul-1935, MRN [id number]  PCP:  Shawn Sick, MD  Cardiologist:  Lonni LITTIE Nanas, MD  Electrophysiologist:  None   Referring MD: Lovie Clarity, MD   No chief complaint on file.   History of Present Illness:    [name] is a 88 y.o. female with a hx of asthma, breast cancer, hypertension, hyperlipidemia, obesity who presents for follow-up.  She was referred by Dr. Narendra for evaluation of aortic stenosis, initially seen 09/02/2021.  She denies any chest pain, lightheadedness, syncope, or palpitations.  Does report she gets short of breath with minimal exertion.  Does improve with inhaler use.  Reports swelling in her feet, uses compression stockings.  Smoked in her 6s.  Family history includes both her daughters had strokes.  Echocardiogram 08/03/2021 showed EF 65 to 70%, normal RV function, moderate aortic stenosis.    She was admitted 05/2022 with acute on chronic diastolic heart failure.  She was diuresed with IV Lasix  and discharged on p.o. Lasix  40 mg daily.  Also treated for possible ILD flare with prednisone .  Echocardiogram 05/07/2022 showed EF 60 to 65%, normal RV function, moderate aortic stenosis.  Echocardiogram 08/2022 showed EF 60 to 65%, RV not well-visualized, small pericardial effusion, mild mitral regurgitation, mild to moderate aortic stenosis.  Stress PET 11/23/2022 showed normal perfusion, EF 73%, normal myocardial stress flows (though low myocardial blood flow reserve due to high resting flows), moderate coronary calcifications.  Since last clinic visit,  reports she is doing okay.  She continues to have shortness of breath.  Has chest pain when she eats at times, resolves with Tums.  Weight is down 7 pounds.  She has been taking Lasix  when notices swelling in her legs.  She denies any lightheadedness or syncope.  Denies any palpitations.  BP Readings from Last 3 Encounters:  06/11/23 (!) 197/79   06/02/23 (!) 163/90  05/31/23 134/84    Wt Readings from Last 3 Encounters:  06/10/23 211 lb (95.7 kg)  05/31/23 214 lb 6.4 oz (97.3 kg)  02/24/23 215 lb 8 oz (97.8 kg)     Past Medical History:  Diagnosis Date   Allergic rhinitis, cause unspecified    Asthma    B12 deficiency 06/15/2022   Cancer (HCC)    CHF exacerbation (HCC) 05/08/2022   Chronic back pain    Coronary artery calcification seen on CAT scan 04/21/2017   Enlarged pulmonary artery (HCC) 04/21/2017   By chest CT   Frequent urination 11/14/2017   GERD (gastroesophageal reflux disease) 05/19/2011   history of Bilateral leg edema-likely amlodipine  induced. 07/28/2011   history of CAP (community acquired pneumonia) 07/14/2011   history of HYPOTHYROIDISM, BORDERLINE 03/20/2006   Annotation: Asymptomatic, untreated Qualifier: Diagnosis of  By: Rogerio MD, Starlyn BIRCH.    Hx of breast cancer    Hyperlipidemia    Hypertension    Hypothyroidism    Hypothyroidism 03/20/2006   Annotation: Asymptomatic, untreated  Qualifier: Diagnosis of   By: Rogerio MD, Starlyn BIRCH.      Lumbago    Murmur, cardiac 11/22/2016   OBESITY NOS 03/20/2006   Qualifier: Diagnosis of  By: Rogerio MD, Veronique D.    OSTEOARTHRITIS 12/13/2005   Annotation: bilateral knees;right knee injection 6/05 Qualifier: Diagnosis of  By: Rogerio MD, Starlyn BIRCH.    Pain, dental 09/18/2020   Preventative health care 09/04/2014   Pulmonary nodule 05/30/2017   4 mm  right middle lobe nodule noted on high-resolution CT done on February 06, 2017   right shoulder pain, likely Impingement syndrome  09/09/2010   Upper respiratory infection 04/09/2018   VENOUS INSUFFICIENCY, CHRONIC 03/20/2006   Qualifier: Diagnosis of  By: Rogerio MD, Starlyn BIRCH.     Past Surgical History:  Procedure Laterality Date   ABDOMINAL HYSTERECTOMY     BREAST LUMPECTOMY     COLONOSCOPY     HERNIA REPAIR     HIP CLOSED REDUCTION Right 11/26/2014   Procedure: CLOSED  REDUCTIION RIGHT HIP;  Surgeon: Jerona Harden GAILS, MD;  Location: [facility] OR;  Service: Orthopedics;  Laterality: Right;   INCISIONAL HERNIA REPAIR N/A 10/29/2013   Procedure: OPEN REPAIR OF RECURRENT INCISIONAL HERNIA WITH MESH;  Surgeon: Dann FORBES Hummer, MD;  Location: [facility] OR;  Service: General;  Laterality: N/A;   JOINT REPLACEMENT     TOTAL HIP ARTHROPLASTY      Current Medications: No outpatient medications have been marked as taking for the 09/15/23 encounter (Appointment) with Kate Lonni CROME, MD.     Allergies:   Acyclovir and related, Metoprolol , and Food   Social History   Socioeconomic History   Marital status: Widowed    Spouse name: Not on file   Number of children: Not on file   Years of education: Not on file   Highest education level: Not on file  Occupational History   Occupation: not working  Tobacco Use   Smoking status: Former    Current packs/day: 0.00    Average packs/day: 0.2 packs/day for 0.5 years (0.1 ttl pk-yrs)    Types: Cigarettes    Start date: 03/11/1958    Quit date: 09/09/1958    Years since quitting: 65.0   Smokeless tobacco: Never   Tobacco comments:    social smoker  Vaping Use   Vaping status: Never Used  Substance and Sexual Activity   Alcohol use: No    Alcohol/week: 0.0 standard drinks of alcohol   Drug use: No   Sexual activity: Not Currently  Other Topics Concern   Not on file  Social History Narrative   Lives with daughter.    Social Drivers of Corporate investment banker Strain: Low Risk  (06/15/2022)   Overall Financial Resource Strain (CARDIA)    Difficulty of Paying Living Expenses: Not hard at all  Food Insecurity: No Food Insecurity (06/15/2022)   Hunger Vital Sign    Worried About Running Out of Food in the Last Year: Never true    Ran Out of Food in the Last Year: Never true  Transportation Needs: No Transportation Needs (06/15/2022)   PRAPARE - Administrator, Civil Service (Medical): No    Lack of  Transportation (Non-Medical): No  Physical Activity: Inactive (06/15/2022)   Exercise Vital Sign    Days of Exercise per Week: 0 days    Minutes of Exercise per Session: 0 min  Stress: Stress Concern Present (06/15/2022)   [employer] of Occupational Health - Occupational Stress Questionnaire    Feeling of Stress : To some extent  Social Connections: Moderately Integrated (06/15/2022)   Social Connection and Isolation Panel    Frequency of Communication with Friends and Family: More than three times a week    Frequency of Social Gatherings with Friends and Family: Once a week    Attends Religious Services: More than 4 times per year    Active Member of [employer] or Organizations: Yes    Attends [employer]  or Organization Meetings: 1 to 4 times per year    Marital Status: Widowed     Family History: The patient's family history includes Alzheimer's disease in her mother; Diabetes in her sister; Hypertension in her mother. There is no history of Colon cancer, Colon polyps, Esophageal cancer, Stomach cancer, or Rectal cancer.  ROS:   Please see the history of present illness.     All other systems reviewed and are negative.  EKGs/Labs/Other Studies Reviewed:    The following studies were reviewed today:  EKG:  09/20/2022: Normal sinus rhythm, rate 70, no ST abnormalities  Recent Labs: 01/25/2023: ALT 6 02/09/2023: TSH 3.610 02/12/2023: Hemoglobin 10.0; Platelets 204 05/31/2023: BUN 23; Creatinine, Ser 1.08; Magnesium  1.9; Potassium 4.0; Sodium 135  Recent Lipid Panel    Component Value Date/Time   CHOL 119 05/19/2022 1136   TRIG 69 05/19/2022 1136   HDL 60 05/19/2022 1136   CHOLHDL 2.0 05/19/2022 1136   CHOLHDL 2.5 09/04/2014 1037   VLDL 12 09/04/2014 1037   LDLCALC 45 05/19/2022 1136    Physical Exam:    VS:  There were no vitals taken for this visit.    Wt Readings from Last 3 Encounters:  06/10/23 211 lb (95.7 kg)  05/31/23 214 lb 6.4 oz (97.3 kg)  02/24/23 215 lb 8 oz  (97.8 kg)     GEN:  Well nourished, well developed in no acute distress HEENT: Normal NECK: No JVD; No carotid bruits CARDIAC: RRR, 3 out of 6 systolic murmur RESPIRATORY:  Clear to auscultation without rales, wheezing or rhonchi  ABDOMEN: Soft, non-tender, non-distended MUSCULOSKELETAL: 1+ right lower extremity edema; No deformity  SKIN: Warm and dry NEUROLOGIC:  Alert and oriented x 3 PSYCHIATRIC:  Normal affect   ASSESSMENT:    No diagnosis found.    PLAN:    Dyspnea: Reporting dyspnea with minimal exertion.  Likely multifactorial with asthma, ILD, HFpEF, and deconditioning contributing.  Stress PET 11/23/2022 showed normal perfusion, EF 73%, normal myocardial stress flows (though low myocardial blood flow reserve due to high resting flows), moderate coronary calcifications.  Aortic stenosis: Echocardiogram 08/03/2021 showed EF 65 to 70%, normal RV function, moderate aortic stenosis.  Echocardiogram 05/07/2022 showed EF 60 to 65%, normal RV function, moderate aortic stenosis.  Echo 08/2022 showed mild to moderate aortic stenosis. -Plan repeat echocardiogram in 1 year to follow  Chronic diastolic heart failure: She was admitted 05/2022 with acute on chronic diastolic heart failure.  She was diuresed with IV Lasix  and discharged on p.o. Lasix  40 mg daily. -Continue Lasix  40 mg daily as needed  Hypertension: On amlodipine  10 mg daily and Coreg  25 mg twice daily and losartan  100 mg daily.  BP significantly elevated in clinic today.  Reports compliance with her medications.  Recommend adding chlorthalidone  12.5 mg daily.  Check BMET, magnesium .  Recommend follow-up in pharmacy hypertension clinic in 2 weeks.  Asked patient to check BP daily for next 2 weeks and bring log and home BP monitor to calibrate to appointment with pharmacy.  Suspect untreated OSA contributing to resistant hypertension, recommend compliance with CPAP  Hyperlipidemia: On atorvastatin  40 mg daily.  LDL 45 on  05/19/22  OSA: on CPAP, reports only using about once per week, encouraged to use every day.  RTC in 3 months***    Medication Adjustments/Labs and Tests Ordered: Current medicines are reviewed at length with the patient today.  Concerns regarding medicines are outlined above.  No orders of the defined types were placed in  this encounter.  No orders of the defined types were placed in this encounter.   There are no Patient Instructions on file for this visit.   Signed, Lonni LITTIE Nanas, MD  09/11/2023 10:09 PM    [facility]

## 2023-09-15 ENCOUNTER — Ambulatory Visit: Admitting: Cardiology

## 2023-09-18 ENCOUNTER — Encounter: Payer: Self-pay | Admitting: *Deleted

## 2023-09-19 ENCOUNTER — Other Ambulatory Visit: Payer: Self-pay

## 2023-09-19 DIAGNOSIS — I10 Essential (primary) hypertension: Secondary | ICD-10-CM

## 2023-09-19 MED ORDER — LOSARTAN POTASSIUM 100 MG PO TABS: 100.0000 mg | ORAL_TABLET | Freq: Every day | ORAL | 3 refills | Status: AC

## 2023-09-19 NOTE — Telephone Encounter (Signed)
 Medication sent to pharmacy

## 2023-09-22 ENCOUNTER — Other Ambulatory Visit: Payer: Self-pay | Admitting: Internal Medicine

## 2023-09-22 MED ORDER — ACETAMINOPHEN-CODEINE 300-30 MG PO TABS: 1.0000 | ORAL_TABLET | Freq: Three times a day (TID) | ORAL | 0 refills | Status: DC | PRN

## 2023-09-22 NOTE — Telephone Encounter (Signed)
 Copied from CRM [id number]. Topic: Clinical - Medication Refill >> Sep 22, 2023 10:09 AM [name] wrote: Medication: acetaminophen -codeine  (TYLENOL  #3) 300-30 MG tablet  Has the patient contacted their pharmacy? No (Agent: If no, request that the patient contact the pharmacy for the refill. If patient does not wish to contact the pharmacy document the reason why and proceed with request.) (Agent: If yes, when and what did the pharmacy advise?)  This is the patient's preferred pharmacy:  [facility] - [place], [place] - [address] [place] [address] Phone: [phone] Fax: [phone]  [id number] [place], [place] - [address] [place] [address] Phone: [phone] Fax: [phone]  [id number] [place], [place] - [address] AT [facility] [address] [place] [address] Phone: [phone] Fax: [phone]  Is this the correct pharmacy for this prescription? Yes If no, delete pharmacy and type the correct one.   Has the prescription been filled recently? No  Is the patient out of the medication? Yes  Has the patient been seen for an appointment in the last year OR does the patient have an upcoming appointment? Yes  Can we respond through MyChart? No  Agent: Please be advised that Rx refills may take up to 3 business days. We ask that you follow-up with your pharmacy.

## 2023-09-22 NOTE — Telephone Encounter (Signed)
 I called pt to let her know Tylenol  #3 was last refilled by Dr Harden. Pt stated the doctors here have refilled this med also. I told pt I will send her request to the doctor.

## 2023-09-22 NOTE — Telephone Encounter (Signed)
 Filled for chronic pain by PCP at [facility] for many years. Most recently seen and continued on medication by Dr. Lovie 05/2023. Interim refills by orthopedics. Will send refill today. Recommend f/u appointment to discuss review of controlled substance agreement and continued prescription with patient's new PCP, Dr. Shawn.

## 2023-10-12 ENCOUNTER — Ambulatory Visit: Payer: Self-pay | Admitting: Student

## 2023-10-23 ENCOUNTER — Telehealth: Payer: Self-pay | Admitting: *Deleted

## 2023-10-23 MED ORDER — ACETAMINOPHEN-CODEINE 300-30 MG PO TABS: 1.0000 | ORAL_TABLET | Freq: Three times a day (TID) | ORAL | 0 refills | Status: DC | PRN

## 2023-10-23 MED ORDER — BENZONATATE 100 MG PO CAPS: 100.0000 mg | ORAL_CAPSULE | Freq: Three times a day (TID) | ORAL | 0 refills | Status: DC | PRN

## 2023-10-23 NOTE — Telephone Encounter (Signed)
 RTC to patient states in is a lot of pain.  $tated when she saw Dr. Harden he wants to do more surgery.  Patient does not want to do further surgery on her hip due to her age.  Has an appointment scheduled to see her new Dr. Shawn.  Is asking for enough of the pain medication until she can come in and talk with Dr. Shawn. Has an appointment scheduled for 10/25/2023.  Copied from CRM #[id number]. Topic: Clinical - Medication Question >> Oct 23, 2023 10:31 AM Diannia H wrote: Reason for CRM: Patient called in because she is in pain and she is wanting some medicine sent to the pharmacy to help with the pain. Could you assist? Patient callback number is [phone].   [id number] [place], [place] - [address] [place] [address] Phone: [phone] Fax: [phone] Hours: Not open 24 hours

## 2023-10-23 NOTE — Addendum Note (Signed)
 Addended by: Laquandra Carrillo on: 10/23/2023 01:27 PM   Modules accepted: Orders

## 2023-10-25 ENCOUNTER — Ambulatory Visit (INDEPENDENT_AMBULATORY_CARE_PROVIDER_SITE_OTHER): Payer: Self-pay

## 2023-10-25 ENCOUNTER — Other Ambulatory Visit: Payer: Self-pay

## 2023-10-25 VITALS — BP 147/54 | HR 66 | Temp 97.8°F | Ht 62.0 in | Wt 209.2 lb

## 2023-10-25 DIAGNOSIS — J849 Interstitial pulmonary disease, unspecified: Secondary | ICD-10-CM | POA: Diagnosis not present

## 2023-10-25 DIAGNOSIS — J9611 Chronic respiratory failure with hypoxia: Secondary | ICD-10-CM

## 2023-10-25 DIAGNOSIS — Z79899 Other long term (current) drug therapy: Secondary | ICD-10-CM

## 2023-10-25 DIAGNOSIS — Z7189 Other specified counseling: Secondary | ICD-10-CM

## 2023-10-25 DIAGNOSIS — Z789 Other specified health status: Secondary | ICD-10-CM | POA: Diagnosis not present

## 2023-10-25 DIAGNOSIS — I5032 Chronic diastolic (congestive) heart failure: Secondary | ICD-10-CM

## 2023-10-25 DIAGNOSIS — I110 Hypertensive heart disease with heart failure: Secondary | ICD-10-CM | POA: Diagnosis not present

## 2023-10-25 DIAGNOSIS — M5441 Lumbago with sciatica, right side: Secondary | ICD-10-CM | POA: Diagnosis not present

## 2023-10-25 DIAGNOSIS — I2510 Atherosclerotic heart disease of native coronary artery without angina pectoris: Secondary | ICD-10-CM | POA: Diagnosis not present

## 2023-10-25 DIAGNOSIS — Z0000 Encounter for general adult medical examination without abnormal findings: Secondary | ICD-10-CM | POA: Diagnosis not present

## 2023-10-25 DIAGNOSIS — I10 Essential (primary) hypertension: Secondary | ICD-10-CM

## 2023-10-25 DIAGNOSIS — K219 Gastro-esophageal reflux disease without esophagitis: Secondary | ICD-10-CM

## 2023-10-25 DIAGNOSIS — M19011 Primary osteoarthritis, right shoulder: Secondary | ICD-10-CM

## 2023-10-25 DIAGNOSIS — Z9981 Dependence on supplemental oxygen: Secondary | ICD-10-CM

## 2023-10-25 MED ORDER — ACETAMINOPHEN-CODEINE 300-30 MG PO TABS: 1.0000 | ORAL_TABLET | Freq: Three times a day (TID) | ORAL | 0 refills | Status: DC | PRN

## 2023-10-25 MED ORDER — BUPRENORPHINE HCL-NALOXONE HCL 8-2 MG SL SUBL: 1.0000 | SUBLINGUAL_TABLET | Freq: Two times a day (BID) | SUBLINGUAL | 0 refills | Status: DC | PRN

## 2023-10-25 NOTE — Assessment & Plan Note (Signed)
 Cont hydralazine , losartan , carvedilol . BP elevated today but home readings are better and she has been taking hydralazine  inconsistently as she was unsure if she should be taking it. Will stop amlodipine -atorvastatin  for pill burden. With her age and co morbidities do not think that the mortality benefit of a statin will prove meaningful.

## 2023-10-25 NOTE — Progress Notes (Signed)
 Annual Wellness Visit     Patient: [name], Female    DOB: [birth year], 88 y.o.   MRN: [id number]  Subjective  Chief Complaint  Patient presents with   Follow-up    Over due    Hypertension   Medication Management    Pt reports she is not sure what meds she is to be on because  the  pharmacy  keeps sending her  other meds  which has confused her      [name] is a 88 y.o. female who presents today for her Annual Wellness Visit.  HPI  No acute complaints today. We discussed mainly her medications, indications for each one as she was unsure of what to take given recent medication changes. She does express difficulty with ambulation and ADLs such as bathing and dressing herself. She lives at home with her daughter who had a stroke and has an aide who occasionally helps her but this is insufficient for her mounting needs.  HTN - she is currently taking losartan  100 mg, carvedilol  25 mg BID, hydralazine  25 mg BID (newly started by her cardiologist). She is unsure if she has amlodipine -atorvastatin  at home but she does not have it with her today. BP slightly elevated in office today but she states that she takes her BP at home and it has been in the 120-130's systolic.   CAD (CAC on CT scan) - on aspirin   Chronic hypoxic resp failure 2/2 ILD on home O2 - on 2L, stable.   Chronic hip pain - she is tylenol  #3. She has been evaluated by Ortho however not a candidate for surgery. Pain is stable but not improving given her limited mobility. Also using voltaren  gel and lidocaine  patches.  ACP - we discussed ACP today as part of our visit given her age, co morbidities. She designates her 3 children as her HCAs. She expresses that she would NOT want advanced mechanical support or invasive interventions, specifically she declined intubation, cardiac resuscitation (DNR/DNI). I have encouraged her to discuss her preferences with her children.    Medications: Outpatient Medications Prior  to Visit  Medication Sig   acetaminophen -codeine  (TYLENOL  #3) 300-30 MG tablet Take 1 tablet by mouth every 8 (eight) hours as needed for up to 3 days for moderate pain (pain score 4-6).   albuterol  (VENTOLIN  HFA) 108 (90 Base) MCG/ACT inhaler INHALE 2 PUFFS INTO THE LUNGS EVERY 6 HOURS AS NEEDED FOR WHEEZING OR SHORTNESS OF BREATH.   amLODipine -atorvastatin  (CADUET ) 10-40 MG tablet Take 1 tablet by mouth daily.   aspirin  EC 81 MG tablet Take 81 mg by mouth daily.   brimonidine  (ALPHAGAN ) 0.2 % ophthalmic solution Place 1 drop into the left eye daily.   carvedilol  (COREG ) 25 MG tablet TAKE 1 TABLET BY MOUTH TWICE DAILY WITH A MEAL   fluticasone  furoate-vilanterol (BREO ELLIPTA ) 200-25 MCG/ACT AEPB INHALE 1 PUFF INTO THE LUNGS DAILY   furosemide  (LASIX ) 40 MG tablet TAKE 1 TABLET BY MOUTH DAILY AS NEEDED(FOR WEIGHT GAIN OR 3 LBS OVERNIGHT OR 5 LBS IN WEEK)   GNP DICLOFENAC  SODIUM 1 % GEL APPLY 2 GRAMS TOPICALLY 4 TIMES DAILY   hydrALAZINE  (APRESOLINE ) 25 MG tablet TAKE 1 TABLET THREE TIMES DAILY   latanoprost  (XALATAN ) 0.005 % ophthalmic solution Place 1 drop into the left eye daily.   lidocaine  (LIDODERM ) 5 % Place 1 patch onto the skin every 12 (twelve) hours. Remove & Discard patch within 12 hours or as directed by MD  losartan  (COZAAR ) 100 MG tablet Take 1 tablet (100 mg total) by mouth daily.   No facility-administered medications prior to visit.    Allergies  Allergen Reactions   Acyclovir And Related    Metoprolol  Itching   Food Rash and Other (See Comments)    Pt states that she is allergic to pickles.      Patient Care Team: Shawn Sick, MD as PCP - General Kate Lonni CROME, MD as PCP - Cardiology (Cardiology)  Review of Systems  All other systems reviewed and are negative.       Objective  BP (!) 147/54 (BP Location: Left Arm, Cuff Size: Large)   Pulse 66   Temp 97.8 F (36.6 C) (Oral)   Ht 5' 2 (1.575 m)   Wt 209 lb 3.2 oz (94.9 kg)   SpO2 100%   BMI  38.26 kg/m    Physical Exam  GEN: Well appearing, NAD. Oriented x 3, normal mood and affect  HEENT: Normocephalic, atraumatic, Conjunctiva clear, sclera non-icteric CV: RRR, no M/R/G PULM: diminished b/l GI: Bowel sounds normal, no TTP in all 4 quadrants NEURO: moving all extremities spontaneously in upper and lower extremities.  PSYCH: Oriented X3, intact recent and remote memory, judgment and insight, normal mood and affect.    Most recent functional status assessment:    10/25/2023   10:29 AM  In your present state of health, do you have any difficulty performing the following activities:  Hearing? 0  Vision? 0  Difficulty concentrating or making decisions? 0  Walking or climbing stairs? 1  Dressing or bathing? 1  Doing errands, shopping? 1   Most recent fall risk assessment:    10/25/2023   10:28 AM  Fall Risk   Falls in the past year? 1  Number falls in past yr: 1  Injury with Fall? 1  Risk for fall due to : Other (Comment)  Risk for fall due to: Comment trip and fall  Follow up Falls evaluation completed;Falls prevention discussed    Most recent depression screenings:    10/25/2023   10:29 AM 02/09/2023   11:13 AM  PHQ 2/9 Scores  PHQ - 2 Score 0 0   Most recent cognitive screening:    06/15/2022   11:49 AM  6CIT Screen  What Year? 0 points  What month? 0 points  What time? 0 points  Count back from 20 0 points  Months in reverse 0 points  Repeat phrase 0 points  Total Score 0 points   Most recent Audit-C alcohol use screening    06/15/2022   11:47 AM  Alcohol Use Disorder Test (AUDIT)  1. How often do you have a drink containing alcohol? 0  2. How many drinks containing alcohol do you have on a typical day when you are drinking? 0  3. How often do you have six or more drinks on one occasion? 0  AUDIT-C Score 0   A score of 3 or more in women, and 4 or more in men indicates increased risk for alcohol abuse, EXCEPT if all of the points are from  question 1   Assessment & Plan   Assessment & Plan Difficulty accessing own home Referral to [facility] placed. ILD (interstitial lung disease) (HCC) Cont home O2, albuterol  PRN Chronic respiratory failure with hypoxia (HCC) As above Healthcare maintenance Spoke at length about ACP. She desires to be a DNR/DNI, recommended discussion w/ her children whom she designates as her HCAs. Coronary artery calcification seen on  CAT scan Cont aspirin  Gastroesophageal reflux disease without esophagitis Cont PPI Essential hypertension Cont hydralazine , losartan , carvedilol . BP elevated today but home readings are better and she has been taking hydralazine  inconsistently as she was unsure if she should be taking it. Will stop amlodipine -atorvastatin  for pill burden. With her age and co morbidities do not think that the mortality benefit of a statin will prove meaningful. Chronic heart failure with preserved ejection fraction (HCC) Cont lasix  40 mg PRN Right-sided low back pain with right-sided sciatica, unspecified chronicity Cont tylenol  #3 for now. We discussed weaning however she is in severe pain d/t her weight and immobility. Will follow up to discuss weaning, transition. Consider chronic pain referral at that point.  Annual wellness visit done today including the all of the following: Reviewed patient's Family Medical History Reviewed and updated list of patient's medical providers Assessment of cognitive impairment was done Assessed patient's functional ability Established a written schedule for health screening services Health Risk Assessent Completed and Reviewed  Exercise Activities and Dietary recommendations  Goals   None     Immunization History  Administered Date(s) Administered   Fluad Quad(high Dose 65+) 12/27/2019, 02/22/2022   Fluad Trivalent(High Dose 65+) 02/09/2023   H1N1 03/26/2008   Influenza Split 11/25/2010, 01/19/2012   Influenza Whole 02/09/2006, 12/10/2007,  04/16/2009, 01/07/2010   Influenza, High Dose Seasonal PF 05/06/2021   Influenza,inj,Quad PF,6+ Mos 02/15/2013, 12/09/2014, 01/12/2016, 11/22/2016, 11/14/2017   PFIZER(Purple Top)SARS-COV-2 Vaccination 04/13/2019, 05/04/2019   Pneumococcal Conjugate-13 09/04/2014   Pneumococcal Polysaccharide-23 09/10/2010   Tdap 11/25/2010, 05/26/2021    Health Maintenance  Topic Date Due   Zoster Vaccines- Shingrix  (1 of 2) Never done   COVID-19 Vaccine (3 - Pfizer risk series) 06/01/2019   INFLUENZA VACCINE  10/06/2023   Medicare Annual Wellness (AWV)  10/24/2024   DTaP/Tdap/Td (3 - Td or Tdap) 05/27/2031   Pneumococcal Vaccine: 50+ Years  Completed   DEXA SCAN  Completed   HPV VACCINES  Aged Out   Meningococcal B Vaccine  Aged Out     Discussed health benefits of physical activity, and encouraged her to engage in regular exercise appropriate for her age and condition.    Problem List Items Addressed This Visit       Cardiovascular and Mediastinum   Essential hypertension (Chronic)   Coronary artery calcification seen on CAT scan (Chronic)   (HFpEF) heart failure with preserved ejection fraction (HCC) (Chronic)     Respiratory   Chronic respiratory failure with hypoxia (HCC) (Chronic)   ILD (interstitial lung disease) (HCC) (Chronic)     Digestive   GERD (gastroesophageal reflux disease) (Chronic)     Other   Advance care planning   Other Visit Diagnoses       Difficulty accessing own home    -  Primary   Relevant Orders   AMB Referral [facility] Care Management       Return in about 3 months (around 01/25/2024) for follow up.     Jone Dauphin, MD

## 2023-10-25 NOTE — Assessment & Plan Note (Signed)
-  Cont aspirin

## 2023-10-25 NOTE — Assessment & Plan Note (Signed)
 Cont tylenol  #3 for now. We discussed weaning however she is in severe pain d/t her weight and immobility. Will follow up to discuss weaning, transition. Consider chronic pain referral at that point.

## 2023-10-25 NOTE — Patient Instructions (Signed)
 Thank you for coming in today. If you have any questions or concerns, please feel free to contact me via MyChart or call the office.   If you have gotten labs today, I will be in touch via MyChart or telephone.   Have a great day.

## 2023-10-25 NOTE — Assessment & Plan Note (Signed)
 Spoke at length about ACP. She desires to be a DNR/DNI, recommended discussion w/ her children whom she designates as her HCAs.

## 2023-10-25 NOTE — Assessment & Plan Note (Signed)
 Cont home O2, albuterol  PRN

## 2023-10-25 NOTE — Assessment & Plan Note (Signed)
 Cont lasix  40 mg PRN

## 2023-10-25 NOTE — Assessment & Plan Note (Signed)
-   As above

## 2023-10-25 NOTE — Assessment & Plan Note (Signed)
 Cont PPI

## 2023-11-10 ENCOUNTER — Other Ambulatory Visit: Payer: Self-pay

## 2023-11-13 NOTE — Telephone Encounter (Signed)
 Medication discontinued 10/23/23

## 2023-11-22 ENCOUNTER — Telehealth: Payer: Self-pay

## 2023-11-22 ENCOUNTER — Ambulatory Visit

## 2023-11-22 VITALS — Ht 62.0 in | Wt 209.0 lb

## 2023-11-22 DIAGNOSIS — Z0000 Encounter for general adult medical examination without abnormal findings: Secondary | ICD-10-CM

## 2023-11-22 NOTE — Progress Notes (Signed)
 Subjective:   [name] is a 88 y.o. female who presents for Medicare Annual (Subsequent) preventive examination.  Visit Complete: Virtual I connected with  [name] on 11/22/23 by a audio enabled telemedicine application and verified that I am speaking with the correct person using two identifiers.  Patient Location: Home  Provider Location: Home Office  I discussed the limitations of evaluation and management by telemedicine. The patient expressed understanding and agreed to proceed.  Vital Signs: Because this visit was a virtual/telehealth visit, some criteria may be missing or patient reported. Any vitals not documented were not able to be obtained and vitals that have been documented are patient reported.   Cardiac Risk Factors include: advanced age (>48men, >106 women);dyslipidemia;hypertension;obesity (BMI >30kg/m2);sedentary lifestyle     Objective:    Today's Vitals   11/22/23 0910 11/22/23 0916  Weight: 209 lb (94.8 kg)   Height: 5' 2 (1.575 m)   PainSc:  8    Body mass index is 38.23 kg/m.     10/25/2023   10:29 AM 06/10/2023   10:49 PM 02/11/2023   11:25 PM 06/15/2022   11:48 AM 06/15/2022   10:05 AM 05/08/2022    1:31 AM 04/27/2022   11:57 AM  Advanced Directives  Does Patient Have a Medical Advance Directive? No No No No No No No  Would patient like information on creating a medical advance directive? No - Patient declined No - Patient declined  No - Guardian declined No - Patient declined No - Patient declined No - Patient declined    Current Medications (verified) Outpatient Encounter Medications as of 11/22/2023  Medication Sig   acetaminophen  (TYLENOL ) 325 MG tablet Take 650 mg by mouth every 6 (six) hours as needed for mild pain (pain score 1-3).   acetaminophen -codeine  (TYLENOL  #3) 300-30 MG tablet Take 1 tablet by mouth every 8 (eight) hours as needed for moderate pain (pain score 4-6).   albuterol  (VENTOLIN  HFA) 108 (90 Base) MCG/ACT inhaler INHALE  2 PUFFS INTO THE LUNGS EVERY 6 HOURS AS NEEDED FOR WHEEZING OR SHORTNESS OF BREATH.   aspirin  EC 81 MG tablet Take 81 mg by mouth daily.   brimonidine  (ALPHAGAN ) 0.2 % ophthalmic solution Place 1 drop into the left eye daily.   carvedilol  (COREG ) 25 MG tablet TAKE 1 TABLET BY MOUTH TWICE DAILY WITH A MEAL   fluticasone  furoate-vilanterol (BREO ELLIPTA ) 200-25 MCG/ACT AEPB INHALE 1 PUFF INTO THE LUNGS DAILY   furosemide  (LASIX ) 40 MG tablet TAKE 1 TABLET BY MOUTH DAILY AS NEEDED(FOR WEIGHT GAIN OR 3 LBS OVERNIGHT OR 5 LBS IN WEEK)   GNP DICLOFENAC  SODIUM 1 % GEL APPLY 2 GRAMS TOPICALLY 4 TIMES DAILY   hydrALAZINE  (APRESOLINE ) 25 MG tablet TAKE 1 TABLET THREE TIMES DAILY   latanoprost  (XALATAN ) 0.005 % ophthalmic solution Place 1 drop into the left eye daily.   lidocaine  (LIDODERM ) 5 % Place 1 patch onto the skin every 12 (twelve) hours. Remove & Discard patch within 12 hours or as directed by MD   losartan  (COZAAR ) 100 MG tablet Take 1 tablet (100 mg total) by mouth daily.   No facility-administered encounter medications on file as of 11/22/2023.    Allergies (verified) Acyclovir and related, Metoprolol , and Food   History: Past Medical History:  Diagnosis Date   Allergic rhinitis, cause unspecified    Asthma    B12 deficiency 06/15/2022   Cancer (HCC)    CHF exacerbation (HCC) 05/08/2022   Chronic back pain  Coronary artery calcification seen on CAT scan 04/21/2017   Enlarged pulmonary artery (HCC) 04/21/2017   By chest CT   Frequent urination 11/14/2017   GERD (gastroesophageal reflux disease) 05/19/2011   history of Bilateral leg edema-likely amlodipine  induced. 07/28/2011   history of CAP (community acquired pneumonia) 07/14/2011   history of HYPOTHYROIDISM, BORDERLINE 03/20/2006   Annotation: Asymptomatic, untreated Qualifier: Diagnosis of  By: Rogerio MD, Starlyn BIRCH.    Hx of breast cancer    Hyperlipidemia    Hypertension    Hypothyroidism    Hypothyroidism 03/20/2006    Annotation: Asymptomatic, untreated  Qualifier: Diagnosis of   By: Rogerio MD, Starlyn BIRCH.      Lumbago    Murmur, cardiac 11/22/2016   OBESITY NOS 03/20/2006   Qualifier: Diagnosis of  By: Rogerio MD, Veronique D.    OSTEOARTHRITIS 12/13/2005   Annotation: bilateral knees;right knee injection 6/05 Qualifier: Diagnosis of  By: Rogerio MD, Starlyn BIRCH.    Pain, dental 09/18/2020   Preventative health care 09/04/2014   Pulmonary nodule 05/30/2017   4 mm right middle lobe nodule noted on high-resolution CT done on February 06, 2017   right shoulder pain, likely Impingement syndrome  09/09/2010   Upper respiratory infection 04/09/2018   VENOUS INSUFFICIENCY, CHRONIC 03/20/2006   Qualifier: Diagnosis of  By: Rogerio MD, Starlyn BIRCH.    Past Surgical History:  Procedure Laterality Date   ABDOMINAL HYSTERECTOMY     BREAST LUMPECTOMY     COLONOSCOPY     HERNIA REPAIR     HIP CLOSED REDUCTION Right 11/26/2014   Procedure: CLOSED REDUCTIION RIGHT HIP;  Surgeon: Jerona Harden GAILS, MD;  Location: [facility] OR;  Service: Orthopedics;  Laterality: Right;   INCISIONAL HERNIA REPAIR N/A 10/29/2013   Procedure: OPEN REPAIR OF RECURRENT INCISIONAL HERNIA WITH MESH;  Surgeon: Dann FORBES Hummer, MD;  Location: [facility] OR;  Service: General;  Laterality: N/A;   JOINT REPLACEMENT     TOTAL HIP ARTHROPLASTY     Family History  Problem Relation Age of Onset   Hypertension Mother    Alzheimer's disease Mother    Diabetes Sister    Colon cancer Neg Hx    Colon polyps Neg Hx    Esophageal cancer Neg Hx    Stomach cancer Neg Hx    Rectal cancer Neg Hx    Social History   Socioeconomic History   Marital status: Widowed    Spouse name: Not on file   Number of children: Not on file   Years of education: Not on file   Highest education level: Not on file  Occupational History   Occupation: not working  Tobacco Use   Smoking status: Former    Current packs/day: 0.00    Average packs/day: 0.2 packs/day for  0.5 years (0.1 ttl pk-yrs)    Types: Cigarettes    Start date: 03/11/1958    Quit date: 09/09/1958    Years since quitting: 65.2   Smokeless tobacco: Never   Tobacco comments:    social smoker  Vaping Use   Vaping status: Never Used  Substance and Sexual Activity   Alcohol use: No    Alcohol/week: 0.0 standard drinks of alcohol   Drug use: No   Sexual activity: Not Currently  Other Topics Concern   Not on file  Social History Narrative   Lives with daughter.    Social Drivers of Health   Financial Resource Strain: Low Risk  (11/22/2023)   Overall Financial Resource Strain (CARDIA)  Difficulty of Paying Living Expenses: Not hard at all  Food Insecurity: No Food Insecurity (11/22/2023)   Hunger Vital Sign    Worried About Running Out of Food in the Last Year: Never true    Ran Out of Food in the Last Year: Never true  Transportation Needs: No Transportation Needs (11/22/2023)   PRAPARE - Administrator, Civil Service (Medical): No    Lack of Transportation (Non-Medical): No  Physical Activity: Inactive (11/22/2023)   Exercise Vital Sign    Days of Exercise per Week: 0 days    Minutes of Exercise per Session: 0 min  Stress: No Stress Concern Present (11/22/2023)   [employer] of Occupational Health - Occupational Stress Questionnaire    Feeling of Stress: Not at all  Social Connections: Moderately Integrated (11/22/2023)   Social Connection and Isolation Panel    Frequency of Communication with Friends and Family: More than three times a week    Frequency of Social Gatherings with Friends and Family: More than three times a week    Attends Religious Services: More than 4 times per year    Active Member of [employer] or Organizations: Yes    Attends Banker Meetings: 1 to 4 times per year    Marital Status: Widowed    Tobacco Counseling Counseling given: Not Answered Tobacco comments: social smoker   Clinical Intake:  Pre-visit preparation  completed: Yes  Pain : 0-10 Pain Score: 8  Pain Type: Chronic pain Pain Location: Hip Pain Orientation: Lower Pain Radiating Towards: medicine Pain Descriptors / Indicators: Dull Pain Onset: 1 to 4 weeks ago Pain Frequency: Constant     BMI - recorded: 38.2 Nutritional Status: BMI > 30  Obese Nutritional Risks: None Diabetes: No  How often do you need to have someone help you when you read instructions, pamphlets, or other written materials from your doctor or pharmacy?: 5 - Always (glaucoma) What is the last grade level you completed in school?: 8th  Interpreter Needed?: No  Information entered by :: Arnette Hoots, CMA   Activities of Daily Living    11/22/2023    9:20 AM 10/25/2023   10:29 AM  In your present state of health, do you have any difficulty performing the following activities:  Hearing? 0 0  Vision? 1 0  Difficulty concentrating or making decisions? 0 0  Walking or climbing stairs? 1 1  Dressing or bathing? 1 1  Doing errands, shopping? 1 1  Preparing Food and eating ? Y   Using the Toilet? Y   Comment only due to hip   In the past six months, have you accidently leaked urine? N   Do you have problems with loss of bowel control? N   Managing your Medications? N   Managing your Finances? N   Housekeeping or managing your Housekeeping? Y     Patient Care Team: Shawn Sick, MD as PCP - General Kate Lonni CROME, MD as PCP - Cardiology (Cardiology)  Indicate any recent Medical Services you may have received from other than [facility] providers in the past year (date may be approximate).     Assessment:   This is a routine wellness examination for Oletha.  Hearing/Vision screen No results found.   Goals Addressed             This Visit's Progress    Patient Stated       Patient working on exercising to help bones  Depression Screen    11/22/2023    9:28 AM 10/25/2023   10:29 AM 02/09/2023   11:13 AM 06/15/2022   11:47 AM  06/15/2022   10:37 AM 05/19/2022   11:28 AM 04/27/2022   11:58 AM  PHQ 2/9 Scores  PHQ - 2 Score 0 0 0 0 0 0 0  PHQ- 9 Score 1          Fall Risk    10/25/2023   10:28 AM 02/24/2023   11:08 AM 02/09/2023   11:16 AM 08/09/2022   10:38 AM 06/15/2022   11:48 AM  Fall Risk   Falls in the past year? 1 1 0 0 0  Number falls in past yr: 1 0 0  0  Injury with Fall? 1 1 0  0  Risk for fall due to : Other (Comment) Impaired balance/gait  Impaired balance/gait Impaired balance/gait  Risk for fall due to: Comment trip and fall      Follow up Falls evaluation completed;Falls prevention discussed Falls evaluation completed;Falls prevention discussed  Falls evaluation completed Falls evaluation completed;Falls prevention discussed    MEDICARE RISK AT HOME:    TIMED UP AND GO:  Was the test performed?  No    Cognitive Function:        06/15/2022   11:49 AM  6CIT Screen  What Year? 0 points  What month? 0 points  What time? 0 points  Count back from 20 0 points  Months in reverse 0 points  Repeat phrase 0 points  Total Score 0 points    Immunizations Immunization History  Administered Date(s) Administered   Fluad Quad(high Dose 65+) 12/27/2019, 02/22/2022   Fluad Trivalent(High Dose 65+) 02/09/2023   H1N1 03/26/2008   INFLUENZA, HIGH DOSE SEASONAL PF 05/06/2021   Influenza Split 11/25/2010, 01/19/2012   Influenza Whole 02/09/2006, 12/10/2007, 04/16/2009, 01/07/2010   Influenza,inj,Quad PF,6+ Mos 02/15/2013, 12/09/2014, 01/12/2016, 11/22/2016, 11/14/2017   PFIZER(Purple Top)SARS-COV-2 Vaccination 04/13/2019, 05/04/2019   Pneumococcal Conjugate-13 09/04/2014   Pneumococcal Polysaccharide-23 09/10/2010   Tdap 11/25/2010, 05/26/2021    TDAP status: Up to date  Flu Vaccine status: Due, Education has been provided regarding the importance of this vaccine. Advised may receive this vaccine at local pharmacy or Health Dept. Aware to provide a copy of the vaccination record if obtained  from local pharmacy or Health Dept. Verbalized acceptance and understanding.  Pneumococcal vaccine status: Up to date  Covid-19 vaccine status: Information provided on how to obtain vaccines.   Qualifies for Shingles Vaccine? Yes   Zostavax completed No   Shingrix  Completed?: No.    Education has been provided regarding the importance of this vaccine. Patient has been advised to call insurance company to determine out of pocket expense if they have not yet received this vaccine. Advised may also receive vaccine at local pharmacy or Health Dept. Verbalized acceptance and understanding.  Screening Tests Health Maintenance  Topic Date Due   Zoster Vaccines- Shingrix  (1 of 2) Never done   COVID-19 Vaccine (3 - Pfizer risk series) 06/01/2019   Influenza Vaccine  10/06/2023   Medicare Annual Wellness (AWV)  10/24/2024   DTaP/Tdap/Td (3 - Td or Tdap) 05/27/2031   Pneumococcal Vaccine: 50+ Years  Completed   DEXA SCAN  Completed   HPV VACCINES  Aged Out   Meningococcal B Vaccine  Aged Out    Health Maintenance  Health Maintenance Due  Topic Date Due   Zoster Vaccines- Shingrix  (1 of 2) Never done   COVID-19 Vaccine (  3 - Pfizer risk series) 06/01/2019   Influenza Vaccine  10/06/2023    Colorectal cancer screening: No longer required.   Mammogram status: No longer required due to age.  Bone Density status: Completed 08/07/12. Results reflect: Bone density results: NORMAL. Repeat every 2 years.  Lung Cancer Screening: (Low Dose CT Chest recommended if Age 34-80 years, 20 pack-year currently smoking OR have quit w/in 15years.) does not qualify.   Lung Cancer Screening Referral: n/a  Additional Screening:  Hepatitis C Screening: does qualify; Completed n/a  Vision Screening: Recommended annual ophthalmology exams for early detection of glaucoma and other disorders of the eye. Is the patient up to date with their annual eye exam?  Yes  Who is the provider or what is the name of the  office in which the patient attends annual eye exams? Dr Ruthell If pt is not established with a provider, would they like to be referred to a provider to establish care? No .   Dental Screening: Recommended annual dental exams for proper oral hygiene    Community Resource Referral / Chronic Care Management: CRR required this visit?  No   CCM required this visit?  No     Plan:     I have personally reviewed and noted the following in the patient's chart:   Medical and social history Use of alcohol, tobacco or illicit drugs  Current medications and supplements including opioid prescriptions. Patient is currently taking opioid prescriptions. Information provided to patient regarding non-opioid alternatives. Patient advised to discuss non-opioid treatment plan with their provider. Functional ability and status Nutritional status Physical activity Advanced directives List of other physicians Hospitalizations, surgeries, and ER visits in previous 12 months Vitals Screenings to include cognitive, depression, and falls Referrals and appointments  In addition, I have reviewed and discussed with patient certain preventive protocols, quality metrics, and best practice recommendations. A written personalized care plan for preventive services as well as general preventive health recommendations were provided to patient.     Arnette LOISE Hoots, CMA   11/22/2023   After Visit Summary: (Mail) Due to this being a telephonic visit, the after visit summary with patients personalized plan was offered to patient via mail   Nurse Notes: Patient is interested in seeing if she qualifies for any home health. She is having issues with bathing.

## 2023-11-22 NOTE — Patient Instructions (Signed)
 Ms. Poudrier,  Thank you for taking the time for your Medicare Wellness Visit. I appreciate your continued commitment to your health goals. Please review the care plan we discussed, and feel free to reach out if I can assist you further.  Medicare recommends these wellness visits once per year to help you and your care team stay ahead of potential health issues. These visits are designed to focus on prevention, allowing your provider to concentrate on managing your acute and chronic conditions during your regular appointments.  Please note that Annual Wellness Visits do not include a physical exam. Some assessments may be limited, especially if the visit was conducted virtually. If needed, we may recommend a separate in-person follow-up with your provider.  Ongoing Care Seeing your primary care provider every 3 to 6 months helps us  monitor your health and provide consistent, personalized care.   Referrals If a referral was made during today's visit and you haven't received any updates within two weeks, please contact the referred provider directly to check on the status.  Recommended Screenings:  Health Maintenance  Topic Date Due   Zoster (Shingles) Vaccine (1 of 2) Never done   COVID-19 Vaccine (3 - Pfizer risk series) 06/01/2019   Flu Shot  10/06/2023   Medicare Annual Wellness Visit  10/24/2024   DTaP/Tdap/Td vaccine (3 - Td or Tdap) 05/27/2031   Pneumococcal Vaccine for age over 71  Completed   DEXA scan (bone density measurement)  Completed   HPV Vaccine  Aged Out   Meningitis B Vaccine  Aged Out       10/25/2023   10:29 AM  Advanced Directives  Does Patient Have a Medical Advance Directive? No  Would patient like information on creating a medical advance directive? No - Patient declined   Advance Care Planning is important because it: Ensures you receive medical care that aligns with your values, goals, and preferences. Provides guidance to your family and loved ones,  reducing the emotional burden of decision-making during critical moments.  Vision: Annual vision screenings are recommended for early detection of glaucoma, cataracts, and diabetic retinopathy. These exams can also reveal signs of chronic conditions such as diabetes and high blood pressure.  Dental: Annual dental screenings help detect early signs of oral cancer, gum disease, and other conditions linked to overall health, including heart disease and diabetes.

## 2023-11-22 NOTE — Telephone Encounter (Signed)
 Patient is inquiring about a home health aide. She is having difficulty getting to the bathroom and wondering if her insurance will help with that.

## 2023-11-24 ENCOUNTER — Other Ambulatory Visit: Payer: Self-pay | Admitting: Cardiology

## 2023-11-24 DIAGNOSIS — I10 Essential (primary) hypertension: Secondary | ICD-10-CM

## 2023-12-04 NOTE — Addendum Note (Signed)
 Addended by: SHAWN SICK on: 12/04/2023 11:03 AM   Modules accepted: Level of Service

## 2023-12-11 ENCOUNTER — Ambulatory Visit: Attending: Cardiology | Admitting: Cardiology

## 2023-12-11 NOTE — Progress Notes (Deleted)
 Cardiology Office Note:    Date:  12/11/2023   ID:  [name], DOB [birth year], MRN [id number]  PCP:  Shawn Sick, MD  Cardiologist:  Lonni LITTIE Nanas, MD  Electrophysiologist:  None   Referring MD: Lovie Clarity, MD   No chief complaint on file.   History of Present Illness:    [name] is a 88 y.o. female with a hx of asthma, breast cancer, hypertension, hyperlipidemia, obesity who presents for follow-up.  She was referred by Dr. Narendra for evaluation of aortic stenosis, initially seen 09/02/2021.  She denies any chest pain, lightheadedness, syncope, or palpitations.  Does report she gets short of breath with minimal exertion.  Does improve with inhaler use.  Reports swelling in her feet, uses compression stockings.  Smoked in her 58s.  Family history includes both her daughters had strokes.  Echocardiogram 08/03/2021 showed EF 65 to 70%, normal RV function, moderate aortic stenosis.    She was admitted 05/2022 with acute on chronic diastolic heart failure.  She was diuresed with IV Lasix  and discharged on p.o. Lasix  40 mg daily.  Also treated for possible ILD flare with prednisone .  Echocardiogram 05/07/2022 showed EF 60 to 65%, normal RV function, moderate aortic stenosis.  Echocardiogram 08/2022 showed EF 60 to 65%, RV not well-visualized, small pericardial effusion, mild mitral regurgitation, mild to moderate aortic stenosis.  Stress PET 11/23/2022 showed normal perfusion, EF 73%, normal myocardial stress flows (though low myocardial blood flow reserve due to high resting flows), moderate coronary calcifications.  Since last clinic visit,  reports she is doing okay.  She continues to have shortness of breath.  Has chest pain when she eats at times, resolves with Tums.  Weight is down 7 pounds.  She has been taking Lasix  when notices swelling in her legs.  She denies any lightheadedness or syncope.  Denies any palpitations.  BP Readings from Last 3 Encounters:  10/25/23 (!)  147/54  06/11/23 (!) 197/79  06/02/23 (!) 163/90    Wt Readings from Last 3 Encounters:  11/22/23 209 lb (94.8 kg)  10/25/23 209 lb 3.2 oz (94.9 kg)  06/10/23 211 lb (95.7 kg)     Past Medical History:  Diagnosis Date   Allergic rhinitis, cause unspecified    Asthma    B12 deficiency 06/15/2022   Cancer (HCC)    CHF exacerbation (HCC) 05/08/2022   Chronic back pain    Coronary artery calcification seen on CAT scan 04/21/2017   Enlarged pulmonary artery (HCC) 04/21/2017   By chest CT   Frequent urination 11/14/2017   GERD (gastroesophageal reflux disease) 05/19/2011   history of Bilateral leg edema-likely amlodipine  induced. 07/28/2011   history of CAP (community acquired pneumonia) 07/14/2011   history of HYPOTHYROIDISM, BORDERLINE 03/20/2006   Annotation: Asymptomatic, untreated Qualifier: Diagnosis of  By: Rogerio MD, Starlyn BIRCH.    Hx of breast cancer    Hyperlipidemia    Hypertension    Hypothyroidism    Hypothyroidism 03/20/2006   Annotation: Asymptomatic, untreated  Qualifier: Diagnosis of   By: Rogerio MD, Starlyn BIRCH.      Lumbago    Murmur, cardiac 11/22/2016   OBESITY NOS 03/20/2006   Qualifier: Diagnosis of  By: Rogerio MD, Veronique D.    OSTEOARTHRITIS 12/13/2005   Annotation: bilateral knees;right knee injection 6/05 Qualifier: Diagnosis of  By: Rogerio MD, Starlyn BIRCH.    Pain, dental 09/18/2020   Preventative health care 09/04/2014   Pulmonary nodule 05/30/2017   4 mm right  middle lobe nodule noted on high-resolution CT done on February 06, 2017   right shoulder pain, likely Impingement syndrome  09/09/2010   Upper respiratory infection 04/09/2018   VENOUS INSUFFICIENCY, CHRONIC 03/20/2006   Qualifier: Diagnosis of  By: Rogerio MD, Starlyn BIRCH.     Past Surgical History:  Procedure Laterality Date   ABDOMINAL HYSTERECTOMY     BREAST LUMPECTOMY     COLONOSCOPY     HERNIA REPAIR     HIP CLOSED REDUCTION Right 11/26/2014   Procedure: CLOSED  REDUCTIION RIGHT HIP;  Surgeon: Jerona Harden GAILS, MD;  Location: [facility] OR;  Service: Orthopedics;  Laterality: Right;   INCISIONAL HERNIA REPAIR N/A 10/29/2013   Procedure: OPEN REPAIR OF RECURRENT INCISIONAL HERNIA WITH MESH;  Surgeon: Dann FORBES Hummer, MD;  Location: [facility] OR;  Service: General;  Laterality: N/A;   JOINT REPLACEMENT     TOTAL HIP ARTHROPLASTY      Current Medications: No outpatient medications have been marked as taking for the 12/11/23 encounter (Appointment) with Natalie Lonni CROME, MD.     Allergies:   Acyclovir and related, Metoprolol , and Food   Social History   Socioeconomic History   Marital status: Widowed    Spouse name: Not on file   Number of children: Not on file   Years of education: Not on file   Highest education level: Not on file  Occupational History   Occupation: not working  Tobacco Use   Smoking status: Former    Current packs/day: 0.00    Average packs/day: 0.2 packs/day for 0.5 years (0.1 ttl pk-yrs)    Types: Cigarettes    Start date: 03/11/1958    Quit date: 09/09/1958    Years since quitting: 65.2   Smokeless tobacco: Never   Tobacco comments:    social smoker  Vaping Use   Vaping status: Never Used  Substance and Sexual Activity   Alcohol use: No    Alcohol/week: 0.0 standard drinks of alcohol   Drug use: No   Sexual activity: Not Currently  Other Topics Concern   Not on file  Social History Narrative   Lives with daughter.    Social Drivers of Corporate investment banker Strain: Low Risk  (11/22/2023)   Overall Financial Resource Strain (CARDIA)    Difficulty of Paying Living Expenses: Not hard at all  Food Insecurity: No Food Insecurity (11/22/2023)   Hunger Vital Sign    Worried About Running Out of Food in the Last Year: Never true    Ran Out of Food in the Last Year: Never true  Transportation Needs: No Transportation Needs (11/22/2023)   PRAPARE - Administrator, Civil Service (Medical): No    Lack of  Transportation (Non-Medical): No  Physical Activity: Inactive (11/22/2023)   Exercise Vital Sign    Days of Exercise per Week: 0 days    Minutes of Exercise per Session: 0 min  Stress: No Stress Concern Present (11/22/2023)   [employer] of Occupational Health - Occupational Stress Questionnaire    Feeling of Stress: Not at all  Social Connections: Moderately Integrated (11/22/2023)   Social Connection and Isolation Panel    Frequency of Communication with Friends and Family: More than three times a week    Frequency of Social Gatherings with Friends and Family: More than three times a week    Attends Religious Services: More than 4 times per year    Active Member of [employer] or Organizations: Yes  Attends Banker Meetings: 1 to 4 times per year    Marital Status: Widowed     Family History: The patient's family history includes Alzheimer's disease in her mother; Diabetes in her sister; Hypertension in her mother. There is no history of Colon cancer, Colon polyps, Esophageal cancer, Stomach cancer, or Rectal cancer.  ROS:   Please see the history of present illness.     All other systems reviewed and are negative.  EKGs/Labs/Other Studies Reviewed:    The following studies were reviewed today:  EKG:  09/20/2022: Normal sinus rhythm, rate 70, no ST abnormalities  Recent Labs: 01/25/2023: ALT 6 02/09/2023: TSH 3.610 02/12/2023: Hemoglobin 10.0; Platelets 204 05/31/2023: BUN 23; Creatinine, Ser 1.08; Magnesium  1.9; Potassium 4.0; Sodium 135  Recent Lipid Panel    Component Value Date/Time   CHOL 119 05/19/2022 1136   TRIG 69 05/19/2022 1136   HDL 60 05/19/2022 1136   CHOLHDL 2.0 05/19/2022 1136   CHOLHDL 2.5 09/04/2014 1037   VLDL 12 09/04/2014 1037   LDLCALC 45 05/19/2022 1136    Physical Exam:    VS:  There were no vitals taken for this visit.    Wt Readings from Last 3 Encounters:  11/22/23 209 lb (94.8 kg)  10/25/23 209 lb 3.2 oz (94.9 kg)  06/10/23  211 lb (95.7 kg)     GEN:  Well nourished, well developed in no acute distress HEENT: Normal NECK: No JVD; No carotid bruits CARDIAC: RRR, 3 out of 6 systolic murmur RESPIRATORY:  Clear to auscultation without rales, wheezing or rhonchi  ABDOMEN: Soft, non-tender, non-distended MUSCULOSKELETAL: 1+ right lower extremity edema; No deformity  SKIN: Warm and dry NEUROLOGIC:  Alert and oriented x 3 PSYCHIATRIC:  Normal affect   ASSESSMENT:    No diagnosis found.    PLAN:    Dyspnea: Reporting dyspnea with minimal exertion.  Likely multifactorial with asthma, ILD, HFpEF, and deconditioning contributing.  Stress PET 11/23/2022 showed normal perfusion, EF 73%, normal myocardial stress flows (though low myocardial blood flow reserve due to high resting flows), moderate coronary calcifications.  Aortic stenosis: Echocardiogram 08/03/2021 showed EF 65 to 70%, normal RV function, moderate aortic stenosis.  Echocardiogram 05/07/2022 showed EF 60 to 65%, normal RV function, moderate aortic stenosis.  Echo 08/2022 showed mild to moderate aortic stenosis. -Plan repeat echocardiogram in 1 year to follow  Chronic diastolic heart failure: She was admitted 05/2022 with acute on chronic diastolic heart failure.  She was diuresed with IV Lasix  and discharged on p.o. Lasix  40 mg daily. -Continue Lasix  40 mg daily as needed  Hypertension: On hydralazine  25 mg TID and Coreg  25 mg twice daily and losartan  100 mg daily.  Worsening renal function with chlorthalidone , was discontinued.  Previously on amlodipine , but was discontinued due to edema***  Hyperlipidemia: On atorvastatin  40 mg daily.  LDL 45 on 05/19/22.  Appears statin was discontinued***  OSA: on CPAP, reports only using about once per week, encouraged to use every day.  RTC in 3 months***    Medication Adjustments/Labs and Tests Ordered: Current medicines are reviewed at length with the patient today.  Concerns regarding medicines are outlined  above.  No orders of the defined types were placed in this encounter.  No orders of the defined types were placed in this encounter.   There are no Patient Instructions on file for this visit.   Signed, Lonni LITTIE Nanas, MD  12/11/2023 11:43 AM    [facility]

## 2023-12-24 ENCOUNTER — Other Ambulatory Visit: Payer: Self-pay | Admitting: Cardiology

## 2024-01-02 DIAGNOSIS — H409 Unspecified glaucoma: Secondary | ICD-10-CM | POA: Diagnosis not present

## 2024-01-02 DIAGNOSIS — I110 Hypertensive heart disease with heart failure: Secondary | ICD-10-CM | POA: Diagnosis not present

## 2024-01-02 DIAGNOSIS — M1990 Unspecified osteoarthritis, unspecified site: Secondary | ICD-10-CM | POA: Diagnosis not present

## 2024-01-02 DIAGNOSIS — I509 Heart failure, unspecified: Secondary | ICD-10-CM | POA: Diagnosis not present

## 2024-01-02 DIAGNOSIS — Z5982 Transportation insecurity: Secondary | ICD-10-CM | POA: Diagnosis not present

## 2024-01-02 DIAGNOSIS — Z9981 Dependence on supplemental oxygen: Secondary | ICD-10-CM | POA: Diagnosis not present

## 2024-01-02 DIAGNOSIS — I2510 Atherosclerotic heart disease of native coronary artery without angina pectoris: Secondary | ICD-10-CM | POA: Diagnosis not present

## 2024-01-02 DIAGNOSIS — J8410 Pulmonary fibrosis, unspecified: Secondary | ICD-10-CM | POA: Diagnosis not present

## 2024-01-02 DIAGNOSIS — I700 Atherosclerosis of aorta: Secondary | ICD-10-CM | POA: Diagnosis not present

## 2024-01-02 DIAGNOSIS — R011 Cardiac murmur, unspecified: Secondary | ICD-10-CM | POA: Diagnosis not present

## 2024-01-02 DIAGNOSIS — Z7982 Long term (current) use of aspirin: Secondary | ICD-10-CM | POA: Diagnosis not present

## 2024-01-02 DIAGNOSIS — K219 Gastro-esophageal reflux disease without esophagitis: Secondary | ICD-10-CM | POA: Diagnosis not present

## 2024-01-02 DIAGNOSIS — J45909 Unspecified asthma, uncomplicated: Secondary | ICD-10-CM | POA: Diagnosis not present

## 2024-01-02 DIAGNOSIS — Z9989 Dependence on other enabling machines and devices: Secondary | ICD-10-CM | POA: Diagnosis not present

## 2024-01-19 ENCOUNTER — Other Ambulatory Visit: Payer: Self-pay

## 2024-01-23 ENCOUNTER — Encounter (HOSPITAL_COMMUNITY): Payer: Self-pay

## 2024-01-23 ENCOUNTER — Emergency Department (HOSPITAL_COMMUNITY)

## 2024-01-23 ENCOUNTER — Other Ambulatory Visit: Payer: Self-pay

## 2024-01-23 ENCOUNTER — Emergency Department (HOSPITAL_COMMUNITY)
Admission: EM | Admit: 2024-01-23 | Discharge: 2024-01-24 | Disposition: A | Discharge status: Home / Self Care | Attending: Emergency Medicine | Admitting: Emergency Medicine

## 2024-01-23 DIAGNOSIS — M7989 Other specified soft tissue disorders: Secondary | ICD-10-CM | POA: Diagnosis not present

## 2024-01-23 DIAGNOSIS — M47816 Spondylosis without myelopathy or radiculopathy, lumbar region: Secondary | ICD-10-CM | POA: Diagnosis not present

## 2024-01-23 DIAGNOSIS — R600 Localized edema: Secondary | ICD-10-CM | POA: Diagnosis not present

## 2024-01-23 DIAGNOSIS — I110 Hypertensive heart disease with heart failure: Secondary | ICD-10-CM | POA: Insufficient documentation

## 2024-01-23 DIAGNOSIS — M1612 Unilateral primary osteoarthritis, left hip: Secondary | ICD-10-CM | POA: Diagnosis not present

## 2024-01-23 DIAGNOSIS — R531 Weakness: Secondary | ICD-10-CM | POA: Insufficient documentation

## 2024-01-23 DIAGNOSIS — Z96641 Presence of right artificial hip joint: Secondary | ICD-10-CM | POA: Insufficient documentation

## 2024-01-23 DIAGNOSIS — Z7982 Long term (current) use of aspirin: Secondary | ICD-10-CM | POA: Insufficient documentation

## 2024-01-23 DIAGNOSIS — R609 Edema, unspecified: Secondary | ICD-10-CM | POA: Diagnosis not present

## 2024-01-23 DIAGNOSIS — I509 Heart failure, unspecified: Secondary | ICD-10-CM | POA: Diagnosis not present

## 2024-01-23 DIAGNOSIS — M25551 Pain in right hip: Secondary | ICD-10-CM | POA: Insufficient documentation

## 2024-01-23 DIAGNOSIS — M25552 Pain in left hip: Secondary | ICD-10-CM | POA: Diagnosis not present

## 2024-01-23 MED ORDER — IBUPROFEN 200 MG PO TABS
400.0000 mg | ORAL_TABLET | Freq: Once | ORAL | Status: AC
  Administered 2024-01-23: 400 mg via ORAL
  Filled 2024-01-23: qty 2

## 2024-01-23 MED ORDER — LIDOCAINE 5 % EX PTCH
1.0000 | MEDICATED_PATCH | Freq: Once | CUTANEOUS | Status: AC
  Administered 2024-01-23: 2 via TRANSDERMAL
  Filled 2024-01-23: qty 2

## 2024-01-23 MED ORDER — ACETAMINOPHEN 325 MG PO TABS: 650.0000 mg | ORAL_TABLET | ORAL | Status: DC | PRN

## 2024-01-23 MED ORDER — HYDRALAZINE HCL 25 MG PO TABS
25.0000 mg | ORAL_TABLET | Freq: Three times a day (TID) | ORAL | Status: DC
  Administered 2024-01-23 – 2024-01-24 (×2): 25 mg via ORAL
  Filled 2024-01-23 (×2): qty 1

## 2024-01-23 MED ORDER — ASPIRIN 81 MG PO TBEC
81.0000 mg | DELAYED_RELEASE_TABLET | Freq: Every day | ORAL | Status: DC
  Administered 2024-01-23 – 2024-01-24 (×2): 81 mg via ORAL
  Filled 2024-01-23 (×2): qty 1

## 2024-01-23 MED ORDER — BRIMONIDINE TARTRATE 0.2 % OP SOLN
1.0000 [drp] | Freq: Every day | OPHTHALMIC | Status: DC
  Filled 2024-01-23: qty 5

## 2024-01-23 MED ORDER — ALBUTEROL SULFATE HFA 108 (90 BASE) MCG/ACT IN AERS: 2.0000 | INHALATION_SPRAY | Freq: Four times a day (QID) | RESPIRATORY_TRACT | Status: DC | PRN

## 2024-01-23 MED ORDER — DICLOFENAC SODIUM 1 % EX GEL
2.0000 g | Freq: Four times a day (QID) | CUTANEOUS | Status: DC
  Filled 2024-01-23: qty 100

## 2024-01-23 MED ORDER — FLUTICASONE FUROATE-VILANTEROL 200-25 MCG/ACT IN AEPB
1.0000 | INHALATION_SPRAY | Freq: Every day | RESPIRATORY_TRACT | Status: DC
  Filled 2024-01-23: qty 28

## 2024-01-23 MED ORDER — LOSARTAN POTASSIUM 50 MG PO TABS
100.0000 mg | ORAL_TABLET | Freq: Every day | ORAL | Status: DC
  Administered 2024-01-23: 100 mg via ORAL
  Filled 2024-01-23: qty 2

## 2024-01-23 MED ORDER — OXYCODONE-ACETAMINOPHEN 5-325 MG PO TABS
1.0000 | ORAL_TABLET | Freq: Four times a day (QID) | ORAL | Status: DC | PRN
Start: 2024-01-23 — End: 2024-01-24
  Administered 2024-01-24 (×2): 1 via ORAL
  Filled 2024-01-23 (×2): qty 1

## 2024-01-23 MED ORDER — LATANOPROST 0.005 % OP SOLN
1.0000 [drp] | Freq: Every day | OPHTHALMIC | Status: DC
  Filled 2024-01-23: qty 2.5

## 2024-01-23 MED ORDER — OXYCODONE-ACETAMINOPHEN 5-325 MG PO TABS
1.0000 | ORAL_TABLET | Freq: Once | ORAL | Status: AC
  Administered 2024-01-23: 1 via ORAL
  Filled 2024-01-23: qty 1

## 2024-01-23 MED ORDER — CARVEDILOL 12.5 MG PO TABS
25.0000 mg | ORAL_TABLET | Freq: Two times a day (BID) | ORAL | Status: DC
  Administered 2024-01-24: 25 mg via ORAL
  Filled 2024-01-23: qty 2

## 2024-01-23 MED ORDER — FUROSEMIDE 40 MG PO TABS
40.0000 mg | ORAL_TABLET | Freq: Every day | ORAL | Status: DC | PRN
Start: 2024-01-23 — End: 2024-01-24

## 2024-01-23 NOTE — ED Provider Notes (Signed)
 [place] EMERGENCY DEPARTMENT AT [facility] Provider Note   CSN: [id number] Arrival date & time: 01/23/24  1617     Patient presents with: Hip Pain   [name] is a 88 y.o. female.   Patient is an 88 year old female with a past medical history of right hip replacement and previous right hip dislocations, ILD on home O2, CHF, hypertension presenting to the emergency department with right sided hip pain.  Patient states that she has had intermittent pain in her right hip since her hip replacement about 20 years ago.  She states that her pain acutely worsened over the weekend.  She denied any trauma or falls.  She states that she normally uses a walker to get around but is now having trouble using her walker due to the pain in her hip.  She denies any numbness but states that her leg feels weak due to the pain.  She states that her leg is also getting more swollen.  She states she initially just had swelling in her bilateral feet but now she is having swelling going up her right leg.  She denies any associated chest pain or shortness of breath.  She states that she has been taking Tylenol  #3 at home with some relief.  The history is provided by the patient.  Hip Pain       Prior to Admission medications   Medication Sig Start Date End Date Taking? Authorizing Provider  acetaminophen  (TYLENOL ) 325 MG tablet Take 650 mg by mouth every 6 (six) hours as needed for mild pain (pain score 1-3).    [provider]  acetaminophen -codeine  (TYLENOL  #3) 300-30 MG tablet Take 1 tablet by mouth every 8 (eight) hours as needed for moderate pain (pain score 4-6). 01/19/24 02/18/24  Shawn Sick, MD  albuterol  (VENTOLIN  HFA) [id number] Base) MCG/ACT inhaler INHALE 2 PUFFS INTO THE LUNGS EVERY 6 HOURS AS NEEDED FOR WHEEZING OR SHORTNESS OF BREATH. 03/04/22   Lovie Clarity, MD  aspirin  EC 81 MG tablet Take 81 mg by mouth daily.    [provider]  brimonidine  (ALPHAGAN ) 0.2 %  ophthalmic solution Place 1 drop into the left eye daily. 07/20/21   [provider]  carvedilol  (COREG ) 25 MG tablet TAKE 1 TABLET BY MOUTH TWICE DAILY WITH A MEAL 08/21/23   Lovie Clarity, MD  fluticasone  furoate-vilanterol (BREO ELLIPTA ) 200-25 MCG/ACT AEPB INHALE 1 PUFF INTO THE LUNGS DAILY 03/20/23   Hope Almarie ORN, NP  furosemide  (LASIX ) 40 MG tablet TAKE 1 TABLET BY MOUTH DAILY AS NEEDED (FOR WEIGHT GAIN OR 3 LBS OVERNIGHT OR 5 LBS IN WEEK) 12/25/23   Kate Lonni LITTIE, MD  GNP DICLOFENAC  SODIUM 1 % GEL APPLY 2 GRAMS TOPICALLY 4 TIMES DAILY 03/14/23   Lovie Clarity, MD  hydrALAZINE  (APRESOLINE ) 25 MG tablet TAKE 1 TABLET THREE TIMES DAILY 11/24/23   Kate Lonni LITTIE, MD  latanoprost  (XALATAN ) 0.005 % ophthalmic solution Place 1 drop into the left eye daily. 06/26/21   [provider]  lidocaine  (LIDODERM ) 5 % Place 1 patch onto the skin every 12 (twelve) hours. Remove & Discard patch within 12 hours or as directed by MD 05/31/23   Lovie Clarity, MD  losartan  (COZAAR ) 100 MG tablet Take 1 tablet (100 mg total) by mouth daily. 09/19/23   Lovie Clarity, MD    Allergies: Acyclovir and related, Metoprolol , and Food    Review of Systems  Updated Vital Signs BP (!) 168/129 (BP Location: Right Arm)  Pulse 69   Temp 98.1 F (36.7 C) (Oral)   Resp 15   SpO2 100%   Physical Exam Vitals and nursing note reviewed.  Constitutional:      General: She is not in acute distress.    Appearance: Normal appearance. She is obese.  HENT:     Head: Normocephalic and atraumatic.     Nose: Nose normal.     Mouth/Throat:     Mouth: Mucous membranes are moist.  Eyes:     Extraocular Movements: Extraocular movements intact.     Conjunctiva/sclera: Conjunctivae normal.  Cardiovascular:     Rate and Rhythm: Normal rate and regular rhythm.     Pulses: Normal pulses.     Heart sounds: Normal heart sounds.  Pulmonary:     Effort: Pulmonary effort is normal.     Breath  sounds: Normal breath sounds.  Abdominal:     General: Abdomen is flat.     Palpations: Abdomen is soft.     Tenderness: There is no abdominal tenderness.  Musculoskeletal:     Cervical back: Normal range of motion.     Comments: Pain in R hip with hip flexion/internal/external rotation No obvious deformity or shortening of RLE 5/5 bilateral plantar/dorsiflexion 1+ edema to RLE and L foot/ankle, R calf appears larger compared to L, no calf tenderness, + R thigh tenderness to palpation  Skin:    General: Skin is warm and dry.  Neurological:     Mental Status: She is alert and oriented to person, place, and time.     Motor: Weakness (RLE weakness in hip flexion/knee extension though exam limited 2/2 pain) present.  Psychiatric:        Mood and Affect: Mood normal.        Behavior: Behavior normal.     (all labs ordered are listed, but only abnormal results are displayed) Labs Reviewed - No data to display  EKG: None  Radiology: DG Hip Unilat With Pelvis 2-3 Views Right Result Date: 01/23/2024 EXAM: 2 OR MORE VIEW(S) XRAY OF THE LEFT HIP 01/23/2024 06:12:00 PM COMPARISON: 06/11/2023 CLINICAL HISTORY: hip pain FINDINGS: BONES AND JOINTS: No acute fracture or focal osseous lesion of the left hip. The left hip joint is maintained. Mild degenerative changes of the left hip. Right hip arthroplasty is in place. LUMBAR SPINE: Degenerative changes of the lower lumbar spine. SOFT TISSUES: The soft tissues are unremarkable. IMPRESSION: 1. Right hip arthroplasty in place. 2. Other, non-acute and/or normal findings as above. Electronically signed by: Morgane Naveau MD 01/23/2024 06:31 PM EST RP Workstation: [id number]     Procedures   Medications Ordered in the ED  lidocaine  (LIDODERM ) 5 % 1-3 patch (2 patches Transdermal Patch Applied 01/23/24 1742)  acetaminophen  (TYLENOL ) tablet 650 mg (has no administration in time range)  ibuprofen  (ADVIL ) tablet 400 mg (has no administration in time  range)  albuterol  (VENTOLIN  HFA) 108 (90 Base) MCG/ACT inhaler 2 puff (has no administration in time range)  aspirin  EC tablet 81 mg (has no administration in time range)  brimonidine  (ALPHAGAN ) 0.2 % ophthalmic solution 1 drop (has no administration in time range)  carvedilol  (COREG ) tablet 25 mg (has no administration in time range)  fluticasone  furoate-vilanterol (BREO ELLIPTA ) 200-25 MCG/ACT 1 puff (has no administration in time range)  furosemide  (LASIX ) tablet 40 mg (has no administration in time range)  diclofenac  Sodium (VOLTAREN ) 1 % topical gel 2 g (has no administration in time range)  hydrALAZINE  (APRESOLINE ) tablet 25 mg (has no  administration in time range)  latanoprost  (XALATAN ) 0.005 % ophthalmic solution 1 drop (has no administration in time range)  losartan  (COZAAR ) tablet 100 mg (has no administration in time range)  oxyCODONE -acetaminophen  (PERCOCET/ROXICET) 5-325 MG per tablet 1 tablet (1 tablet Oral Given 01/23/24 1742)    Clinical Course as of 01/23/24 1916  Tue Jan 23, 2024  1737 No LE US  availability after 5PM. If XR normal will need referral for outpatient study. [VK]  1833 No acute abnormality with patient's hip arthroplasty. [VK]  1912 Patient was recommended outpatient ultrasound however she states that she does not think that she can get herself out of her house without ambulance assistance.  Will place under TOC hold with ultrasound in the morning as well as PT eval since she is having difficulty walking and getting out of her house. [VK]    Clinical Course User Index [VK] Kingsley, Bronsen Serano K, DO                                 Medical Decision Making This patient presents to the ED with chief complaint(s) of R hip pain with pertinent past medical history of prior R hip replacement and multiple prior hip dislocations, CHF, ILD, HTN which further complicates the presenting complaint. The complaint involves an extensive differential diagnosis and also carries  with it a high risk of complications and morbidity.    The differential diagnosis includes fracture, dislocation, hardware malfunction, muscle strain or spasm, considering DVT with swelling of right leg compared to the left, no signs of neurovascular injury  Additional history obtained: Additional history obtained from N/A Records reviewed outpatient Ortho records  ED Course and Reassessment: On patient's arrival she is mildly hypertensive but otherwise hemodynamically stable and in no acute distress.  She does have significant tenderness to the right hip and thigh and pain with ROM, no obvious deformity and is neuro vastly intact.  Her right lower extremity is swollen compared to the left.  She will have right hip x-ray performed.  Initially concern for DVT but we do not have vascular studies available at this time of the day and may need follow-up DVT study if x-ray does not have explanation of patient's symptoms.  Independent labs interpretation:  N/A  Independent visualization of imaging: - I independently visualized the following imaging with scope of interpretation limited to determining acute life threatening conditions related to emergency care: R hip XR, which revealed no acute disease  Consultation: - Consulted or discussed management/test interpretation w/ external professional: TOC/PT   Amount and/or Complexity of Data Reviewed Radiology: ordered.  Risk OTC drugs. Prescription drug management.       Final diagnoses:  Right hip pain    ED Discharge Orders     None          Kingsley, Jonavan Vanhorn K, DO 01/23/24 1916

## 2024-01-23 NOTE — Progress Notes (Signed)
 Pt to be evaluated by PT in the morning to determine disposition.

## 2024-01-23 NOTE — ED Triage Notes (Addendum)
 BIB EMS from home for right hip pain, no falls or injury. Pain started a week ago. Hip replaced in 2006.

## 2024-01-24 ENCOUNTER — Emergency Department (EMERGENCY_DEPARTMENT_HOSPITAL): Admit: 2024-01-24 | Discharge: 2024-01-24 | Disposition: A | Attending: Emergency Medicine

## 2024-01-24 DIAGNOSIS — M7989 Other specified soft tissue disorders: Secondary | ICD-10-CM | POA: Diagnosis not present

## 2024-01-24 DIAGNOSIS — Z7401 Bed confinement status: Secondary | ICD-10-CM | POA: Diagnosis not present

## 2024-01-24 DIAGNOSIS — I10 Essential (primary) hypertension: Secondary | ICD-10-CM | POA: Diagnosis not present

## 2024-01-24 DIAGNOSIS — M25551 Pain in right hip: Secondary | ICD-10-CM | POA: Diagnosis not present

## 2024-01-24 NOTE — Progress Notes (Signed)
   01/24/24 0933  TOC Brief Assessment  Insurance and Status Reviewed  Patient has primary care physician Yes  Home environment has been reviewed From c/daughter an dsupportive extended family nearby  Prior level of function: Assisted  Prior/Current Home Services No current home services  Social Drivers of Health Review SDOH reviewed no interventions necessary  Readmission risk has been reviewed Yes  Transition of care needs transition of care needs identified, TOC will continue to follow   Current DME: oxygen  ([employer]), rollator, cane  CM spoke c/granddaughter Crystal by phone. Family does not have a preferred [facility] agency. [facility] accepted referral for [facility] PT. Granddaughter and AVS updated.

## 2024-01-24 NOTE — ED Provider Notes (Signed)
 Patient was a boarder status in our emergency room due to right hip pain.  Negative DVT ultrasound, case management evaluated.  Appropriate for discharge.   Mannie Pac T, DO 01/24/24 1023

## 2024-01-24 NOTE — Progress Notes (Signed)
 CSW spoke with pt at bedside along with granddaughter. Pt reports she is interested in [facility] services. CSW notified RNCM via secure chat who will assist with arranging. PT also included in secure chat.

## 2024-01-24 NOTE — Evaluation (Signed)
 Physical Therapy Evaluation Patient Details Name: [name] MRN: [id number] DOB: [birth year] Today's Date: 01/24/2024  History of Present Illness  Patient is an 88 year old female  presenting to the emergency department with right sided hip pain, difficulty ambulating with RW, previous THA and dislocations, xray negative for dislocation currently.PMH: with a past medical history of right hip replacement and previous right hip dislocations, ILD on home O2, CHF, hypertension  Clinical Impression  The patient is resting in a recliner. Patient reports that her pain is improved and that she is ready to return home. Patient's son present.  Patient resides in second floor apt with daughter. Son reports that patient is not able to negotiate a flight of steps.  Patient is modified independent in ambulation w/ rollator  at baseline, uses 2 L [place]. Patient ambulated  x 40' on 2 L, SPo2   94 %. Patient eager to return home. Recommend HHPT.  Son requesting nonemergency transport.        If plan is discharge home, recommend the following: A little help with walking and/or transfers;A little help with bathing/dressing/bathroom;Help with stairs or ramp for entrance;Assist for transportation   Can travel by private vehicle        Equipment Recommendations None recommended by PT  Recommendations for Other Services       Functional Status Assessment Patient has had a recent decline in their functional status and/or demonstrates limited ability to make significant improvements in function in a reasonable and predictable amount of time     Precautions / Restrictions Precautions Precautions: Fall Precaution/Restrictions Comments: on O2 Restrictions Weight Bearing Restrictions Per Provider Order: No      Mobility  Bed Mobility               General bed mobility comments: in recliner    Transfers Overall transfer level: Needs assistance Equipment used: Rolling walker (2 wheels) Transfers:  Sit to/from Stand Sit to Stand: Contact guard assist           General transfer comment: extra boost to stand    Ambulation/Gait Ambulation/Gait assistance: Contact guard assist Gait Distance (Feet): 40 Feet Assistive device: Rolling walker (2 wheels) Gait Pattern/deviations: Step-through pattern   Gait velocity interpretation: <1.31 ft/sec, indicative of household ambulator   General Gait Details: gait steady  Careers Information Officer     Tilt Bed    Modified Rankin (Stroke Patients Only)       Balance                                             Pertinent Vitals/Pain Pain Assessment Pain Assessment: No/denies pain    Home Living Family/patient expects to be discharged to:: Private residence Living Arrangements: Children;Other relatives Available Help at Discharge: Family;Available PRN/intermittently Type of Home: Apartment Home Access: Stairs to enter Entrance Stairs-Rails: Doctor, General Practice of Steps: 13   Home Layout: One level Home Equipment: Wheelchair - manual Additional Comments: son reports patient has not been able to negotiate steps( was in first fl apt)    Prior Function Prior Level of Function : Needs assist             Mobility Comments: uses O2 and rollator, independnet to bathroom, in/out of bed ADLs Comments: does own bath, family cooks     Extremity/Trunk Assessment  Upper Extremity Assessment Upper Extremity Assessment: Overall WFL for tasks assessed    Lower Extremity Assessment Lower Extremity Assessment: Generalized weakness    Cervical / Trunk Assessment Cervical / Trunk Assessment: Normal  Communication   Communication Communication: No apparent difficulties    Cognition Arousal: Alert Behavior During Therapy: WFL for tasks assessed/performed   PT - Cognitive impairments: No apparent impairments                         Following commands:  Intact       Cueing       General Comments      Exercises     Assessment/Plan    PT Assessment Patient needs continued PT services  PT Problem List Decreased strength;Decreased activity tolerance;Decreased mobility       PT Treatment Interventions DME instruction;Gait training;Functional mobility training;Therapeutic activities;Therapeutic exercise;Patient/family education    PT Goals (Current goals can be found in the Care Plan section)  Acute Rehab PT Goals Patient Stated Goal: go home now PT Goal Formulation: With patient/family Time For Goal Achievement: 02/07/24 Potential to Achieve Goals: Good    Frequency Min 3X/week     Co-evaluation               AM-PAC PT 6 Clicks Mobility  Outcome Measure Help needed turning from your back to your side while in a flat bed without using bedrails?: A Little Help needed moving from lying on your back to sitting on the side of a flat bed without using bedrails?: A Little Help needed moving to and from a bed to a chair (including a wheelchair)?: A Little Help needed standing up from a chair using your arms (e.g., wheelchair or bedside chair)?: A Little Help needed to walk in hospital room?: A Little Help needed climbing 3-5 steps with a railing? : Total 6 Click Score: 16    End of Session Equipment Utilized During Treatment: Gait belt Activity Tolerance: Patient tolerated treatment well Patient left: in chair;with family/visitor present;with nursing/sitter in room Nurse Communication: Mobility status PT Visit Diagnosis: Unsteadiness on feet (R26.81);Difficulty in walking, not elsewhere classified (R26.2)    Time: 9049-8986 PT Time Calculation (min) (ACUTE ONLY): 23 min   Charges:   PT Evaluation $PT Eval Low Complexity: 1 Low PT Treatments $Gait Training: 8-22 mins PT General Charges $$ ACUTE PT VISIT: 1 Visit         Darice Potters PT Acute Rehabilitation Services Office [phone]   Potters Darice Norris 01/24/2024, 11:29 AM

## 2024-01-24 NOTE — ED Notes (Signed)
 PT at bedside.

## 2024-01-24 NOTE — Progress Notes (Signed)
 Right lower extremity venous duplex has been completed. Preliminary results can be found in CV Proc through chart review.  Results were given to Dr. Mannie.  01/24/24 9:35 AM Cathlyn Collet RVT

## 2024-01-26 ENCOUNTER — Telehealth: Payer: Self-pay | Admitting: *Deleted

## 2024-01-26 NOTE — Telephone Encounter (Signed)
 RTC to [place] to ask what he will be doing for patient.  Message left to call the Clinics.  Patient also wants an order for [facility].                                                    Copied from CRM #[id number]. Topic: Clinical - Home Health Verbal Orders >> Jan 26, 2024 10:51 AM DeAngela L wrote: Caller/Agency: Jerel Physical Therapist with [facility]  Callback Number: [phone] secured line  Service Requested: Physical Therapy Frequency: 1w9 Any new concerns about the patient? No But the patient had a recent hospital visit due severe right hip pain, this is why he is doing pt   Patient is requesting a Home Health Aid to help with Bathing and etc

## 2024-01-26 NOTE — Telephone Encounter (Signed)
 Will forward to Dr. Shawn.                       Copied from CRM #[id number]. Topic: Clinical - Prescription Issue >> Jan 26, 2024 10:55 AM DeAngela L wrote: Reason for CRM: This is Flagged as having a Medication interaction and this is being reported by the Physical Therapist at [facility] at his visit to day  albuterol  (VENTOLIN  HFA) 108 (90 Base) MCG/ACT inhaler carvedilol  (COREG ) 25 MG tablet  Terry Pt with [facility] [phone]

## 2024-01-29 ENCOUNTER — Ambulatory Visit: Payer: Self-pay

## 2024-01-29 NOTE — Telephone Encounter (Signed)
 FYI Only or Action Required?: Action required by provider: request for appointment, medication refill request, and clinical question for provider.  Patient was last seen in primary care on 10/25/2023 by Shawn Sick, MD.  Called Nurse Triage reporting Leg Swelling.  Symptoms began a week ago.  Interventions attempted: Nothing.  Symptoms are: unchanged.  Triage Disposition: See Physician Within 24 Hours  Patient/caregiver understands and will follow disposition?: No, wishes to speak with PCP   Copied from CRM #[id number]. Topic: Clinical - Red Word Triage >> Jan 29, 2024  3:31 PM Carrielelia G wrote: Red Word that prompted transfer to Nurse Triage: swollen feet and legs. Difficulty walking, right hip pain Reason for Disposition  [1] MODERATE leg swelling (e.g., swelling extends up to knees) AND [2] new-onset or getting worse  Answer Assessment - Initial Assessment Questions No available appt today. Offered scheduled appt tomorrow morning, patient declines and reports unable to walk downstairs.  Patient reports have not been taking fluid pills, lost while moving (Lasix ?).  Advised ED/911 if symptoms worsen.  1. ONSET: When did the swelling start? (e.g., minutes, hours, days)     2 weeks ago 2. LOCATION: What part of the leg is swollen?  Are both legs swollen or just one leg?     Both legs, below knee to toes 3. SEVERITY: How bad is the swelling? (e.g., localized; mild, moderate, severe)     Unable to fully answer; feet and legs feel tight 4. REDNESS: Is there redness or signs of infection?     no 5. PAIN: Is the swelling painful to touch? If Yes, ask: How painful is it?   (Scale 1-10; mild, moderate or severe)     moderate 6. FEVER: Do you have a fever? If Yes, ask: What is it, how was it measured, and when did it start?      denies 7. CAUSE: What do you think is causing the leg swelling?     Unsure if taken Lasix  8. MEDICAL HISTORY: Do you have a history of  blood clots (e.g., DVT), cancer, heart failure, kidney disease, or liver failure?     no 9. RECURRENT SYMPTOM: Have you had leg swelling before? If Yes, ask: When was the last time? What happened that time?     yes 10. OTHER SYMPTOMS: Do you have any other symptoms? (e.g., chest pain, difficulty breathing)       Denies chest pain, difficulty breathing Uses oxygen  continuous  Protocols used: Leg Swelling and Edema-A-AH

## 2024-01-29 NOTE — Telephone Encounter (Signed)
 RTC to patient states has lost her Lasix  during a move.  No shortness of breath .  Also would like a refill on her pain patch that helps her hip pain.  Stated that the Tylenol  #3 helps with the hip pain but would like to get the Lidocaine  Patches. Also feels that if she could get a refill on the Lasix   it will help her swelling in her leg.  Informed patient will send message to her doctor.  Patient has to use [name] as family is unable to get her down the stairs for appointments. [name] takes 3 day notice.   Denies shortness of breath or any other problems.  Just Arthritis and the hip pain.

## 2024-01-30 ENCOUNTER — Other Ambulatory Visit: Payer: Self-pay | Admitting: *Deleted

## 2024-01-30 MED ORDER — FUROSEMIDE 40 MG PO TABS: ORAL_TABLET | ORAL | 0 refills | Status: DC

## 2024-01-30 MED ORDER — FUROSEMIDE 40 MG PO TABS: ORAL_TABLET | ORAL | 0 refills | Status: AC

## 2024-01-30 MED ORDER — LIDOCAINE 5 % EX PTCH: 1.0000 | MEDICATED_PATCH | Freq: Two times a day (BID) | CUTANEOUS | 0 refills | Status: AC

## 2024-01-30 NOTE — Telephone Encounter (Signed)
 Lidocaine  5% patches were written as Phone in.Verbal order given to Nick,pharmacist , at [facility]:    Place 1 patch onto the skin every 12 (twelve) hours. Remove & Discard patch within 12 hours or as directed by MD  Dispense Quantity: 10 patch (5 day supply) Refills: 0

## 2024-02-01 ENCOUNTER — Encounter (HOSPITAL_COMMUNITY): Payer: Self-pay | Admitting: Emergency Medicine

## 2024-02-01 ENCOUNTER — Observation Stay (HOSPITAL_COMMUNITY): Admission: EM | Admit: 2024-02-01 | Discharge: 2024-02-04 | Disposition: A | Discharge status: Home / Self Care

## 2024-02-01 ENCOUNTER — Other Ambulatory Visit: Payer: Self-pay

## 2024-02-01 DIAGNOSIS — J45909 Unspecified asthma, uncomplicated: Secondary | ICD-10-CM | POA: Diagnosis not present

## 2024-02-01 DIAGNOSIS — E785 Hyperlipidemia, unspecified: Secondary | ICD-10-CM | POA: Insufficient documentation

## 2024-02-01 DIAGNOSIS — Z23 Encounter for immunization: Secondary | ICD-10-CM | POA: Diagnosis not present

## 2024-02-01 DIAGNOSIS — I2510 Atherosclerotic heart disease of native coronary artery without angina pectoris: Secondary | ICD-10-CM | POA: Diagnosis not present

## 2024-02-01 DIAGNOSIS — E871 Hypo-osmolality and hyponatremia: Secondary | ICD-10-CM | POA: Insufficient documentation

## 2024-02-01 DIAGNOSIS — Z96649 Presence of unspecified artificial hip joint: Secondary | ICD-10-CM | POA: Insufficient documentation

## 2024-02-01 DIAGNOSIS — L8990 Pressure ulcer of unspecified site, unspecified stage: Secondary | ICD-10-CM | POA: Insufficient documentation

## 2024-02-01 DIAGNOSIS — E039 Hypothyroidism, unspecified: Secondary | ICD-10-CM | POA: Insufficient documentation

## 2024-02-01 DIAGNOSIS — Z853 Personal history of malignant neoplasm of breast: Secondary | ICD-10-CM | POA: Insufficient documentation

## 2024-02-01 DIAGNOSIS — N179 Acute kidney failure, unspecified: Principal | ICD-10-CM | POA: Diagnosis present

## 2024-02-01 DIAGNOSIS — I5030 Unspecified diastolic (congestive) heart failure: Secondary | ICD-10-CM | POA: Insufficient documentation

## 2024-02-01 DIAGNOSIS — M25551 Pain in right hip: Secondary | ICD-10-CM | POA: Diagnosis not present

## 2024-02-01 DIAGNOSIS — I110 Hypertensive heart disease with heart failure: Secondary | ICD-10-CM | POA: Diagnosis not present

## 2024-02-01 DIAGNOSIS — R600 Localized edema: Secondary | ICD-10-CM

## 2024-02-01 DIAGNOSIS — Z87891 Personal history of nicotine dependence: Secondary | ICD-10-CM | POA: Insufficient documentation

## 2024-02-01 DIAGNOSIS — I350 Nonrheumatic aortic (valve) stenosis: Secondary | ICD-10-CM | POA: Insufficient documentation

## 2024-02-01 DIAGNOSIS — D62 Acute posthemorrhagic anemia: Secondary | ICD-10-CM | POA: Insufficient documentation

## 2024-02-01 NOTE — ED Triage Notes (Signed)
 Patient here from home BIB [name] for R hip pain, no falls or injury noted. Pain has been intermittent starting up again this afternoon, describes it as an ache. Hip replacement in same hip. Has been trying heat and tylenol  at home with no relief.   VS: 108/60 BP 98% Spo2 HR 80 Resp 20

## 2024-02-01 NOTE — ED Notes (Signed)
 Pt good to the waiting room at this time with Dr. Arlee approval.

## 2024-02-02 ENCOUNTER — Observation Stay (HOSPITAL_COMMUNITY)

## 2024-02-02 DIAGNOSIS — I5030 Unspecified diastolic (congestive) heart failure: Secondary | ICD-10-CM

## 2024-02-02 DIAGNOSIS — N179 Acute kidney failure, unspecified: Principal | ICD-10-CM

## 2024-02-02 DIAGNOSIS — R600 Localized edema: Secondary | ICD-10-CM

## 2024-02-02 DIAGNOSIS — I110 Hypertensive heart disease with heart failure: Secondary | ICD-10-CM

## 2024-02-02 DIAGNOSIS — M25551 Pain in right hip: Secondary | ICD-10-CM | POA: Diagnosis not present

## 2024-02-02 DIAGNOSIS — Z96641 Presence of right artificial hip joint: Secondary | ICD-10-CM

## 2024-02-02 DIAGNOSIS — Z7982 Long term (current) use of aspirin: Secondary | ICD-10-CM

## 2024-02-02 DIAGNOSIS — I350 Nonrheumatic aortic (valve) stenosis: Secondary | ICD-10-CM

## 2024-02-02 DIAGNOSIS — E785 Hyperlipidemia, unspecified: Secondary | ICD-10-CM

## 2024-02-02 DIAGNOSIS — Z79899 Other long term (current) drug therapy: Secondary | ICD-10-CM

## 2024-02-02 DIAGNOSIS — L8990 Pressure ulcer of unspecified site, unspecified stage: Secondary | ICD-10-CM | POA: Insufficient documentation

## 2024-02-02 DIAGNOSIS — I2510 Atherosclerotic heart disease of native coronary artery without angina pectoris: Secondary | ICD-10-CM

## 2024-02-02 LAB — CBC WITH DIFFERENTIAL/PLATELET
Abs Immature Granulocytes: 0.05 K/uL (ref 0.00–0.07)
Basophils Absolute: 0.1 K/uL (ref 0.0–0.1)
Basophils Relative: 1 %
Eosinophils Absolute: 0.2 K/uL (ref 0.0–0.5)
Eosinophils Relative: 2 %
HCT: 25.3 % — ABNORMAL LOW (ref 36.0–46.0)
Hemoglobin: 7.9 g/dL — ABNORMAL LOW (ref 12.0–15.0)
Immature Granulocytes: 1 %
Lymphocytes Relative: 15 %
Lymphs Abs: 1.3 K/uL (ref 0.7–4.0)
MCH: 26.2 pg (ref 26.0–34.0)
MCHC: 31.2 g/dL (ref 30.0–36.0)
MCV: 84.1 fL (ref 80.0–100.0)
Monocytes Absolute: 1 K/uL (ref 0.1–1.0)
Monocytes Relative: 11 %
Neutro Abs: 6.4 K/uL (ref 1.7–7.7)
Neutrophils Relative %: 70 %
Platelets: 264 K/uL (ref 150–400)
RBC: 3.01 MIL/uL — ABNORMAL LOW (ref 3.87–5.11)
RDW: 14.1 % (ref 11.5–15.5)
WBC: 8.9 K/uL (ref 4.0–10.5)
nRBC: 0 % (ref 0.0–0.2)

## 2024-02-02 LAB — IRON AND TIBC
Iron: 30 ug/dL (ref 28–170)
Saturation Ratios: 11 % (ref 10.4–31.8)
TIBC: 265 ug/dL (ref 250–450)
UIBC: 235 ug/dL

## 2024-02-02 LAB — COMPREHENSIVE METABOLIC PANEL WITH GFR
ALT: 12 U/L (ref 0–44)
AST: 35 U/L (ref 15–41)
Albumin: 3.2 g/dL — ABNORMAL LOW (ref 3.5–5.0)
Alkaline Phosphatase: 42 U/L (ref 38–126)
Anion gap: 17 — ABNORMAL HIGH (ref 5–15)
BUN: 54 mg/dL — ABNORMAL HIGH (ref 8–23)
CO2: 31 mmol/L (ref 22–32)
Calcium: 9.8 mg/dL (ref 8.9–10.3)
Chloride: 84 mmol/L — ABNORMAL LOW (ref 98–111)
Creatinine, Ser: 2.08 mg/dL — ABNORMAL HIGH (ref 0.44–1.00)
GFR, Estimated: 23 mL/min — ABNORMAL LOW (ref >60)
Glucose, Bld: 130 mg/dL — ABNORMAL HIGH (ref 70–99)
Potassium: 3.5 mmol/L (ref 3.5–5.1)
Sodium: 132 mmol/L — ABNORMAL LOW (ref 135–145)
Total Bilirubin: 0.9 mg/dL (ref 0.0–1.2)
Total Protein: 6.9 g/dL (ref 6.5–8.1)

## 2024-02-02 LAB — BASIC METABOLIC PANEL WITH GFR
Anion gap: 12 (ref 5–15)
BUN: 49 mg/dL — ABNORMAL HIGH (ref 8–23)
CO2: 34 mmol/L — ABNORMAL HIGH (ref 22–32)
Calcium: 9.9 mg/dL (ref 8.9–10.3)
Chloride: 86 mmol/L — ABNORMAL LOW (ref 98–111)
Creatinine, Ser: 1.89 mg/dL — ABNORMAL HIGH (ref 0.44–1.00)
GFR, Estimated: 26 mL/min — ABNORMAL LOW (ref >60)
Glucose, Bld: 130 mg/dL — ABNORMAL HIGH (ref 70–99)
Potassium: 3 mmol/L — ABNORMAL LOW (ref 3.5–5.1)
Sodium: 132 mmol/L — ABNORMAL LOW (ref 135–145)

## 2024-02-02 LAB — GLUCOSE, CAPILLARY: Glucose-Capillary: 167 mg/dL — ABNORMAL HIGH (ref 70–99)

## 2024-02-02 LAB — BRAIN NATRIURETIC PEPTIDE: B Natriuretic Peptide: 181 pg/mL — ABNORMAL HIGH (ref 0.0–100.0)

## 2024-02-02 LAB — FERRITIN: Ferritin: 227 ng/mL (ref 11–307)

## 2024-02-02 MED ORDER — LACTATED RINGERS IV BOLUS
500.0000 mL | Freq: Once | INTRAVENOUS | Status: AC
  Administered 2024-02-02: 500 mL via INTRAVENOUS

## 2024-02-02 MED ORDER — SENNOSIDES-DOCUSATE SODIUM 8.6-50 MG PO TABS: 1.0000 | ORAL_TABLET | Freq: Every evening | ORAL | Status: DC | PRN

## 2024-02-02 MED ORDER — ADULT MULTIVITAMIN W/MINERALS CH
1.0000 | ORAL_TABLET | Freq: Every day | ORAL | Status: DC
  Administered 2024-02-02 – 2024-02-04 (×3): 1 via ORAL
  Filled 2024-02-02 (×3): qty 1

## 2024-02-02 MED ORDER — CARVEDILOL 25 MG PO TABS
25.0000 mg | ORAL_TABLET | Freq: Two times a day (BID) | ORAL | Status: DC
  Administered 2024-02-02 – 2024-02-04 (×5): 25 mg via ORAL
  Filled 2024-02-02 (×5): qty 1

## 2024-02-02 MED ORDER — OXYCODONE-ACETAMINOPHEN 5-325 MG PO TABS
1.0000 | ORAL_TABLET | Freq: Once | ORAL | Status: AC
  Administered 2024-02-02: 1 via ORAL
  Filled 2024-02-02: qty 1

## 2024-02-02 MED ORDER — POTASSIUM CHLORIDE CRYS ER 20 MEQ PO TBCR
40.0000 meq | EXTENDED_RELEASE_TABLET | Freq: Two times a day (BID) | ORAL | Status: AC
  Administered 2024-02-02 (×2): 40 meq via ORAL
  Filled 2024-02-02: qty 2
  Filled 2024-02-02 (×2): qty 4

## 2024-02-02 MED ORDER — HYDRALAZINE HCL 25 MG PO TABS
25.0000 mg | ORAL_TABLET | Freq: Three times a day (TID) | ORAL | Status: DC
  Filled 2024-02-02: qty 1

## 2024-02-02 MED ORDER — ALUM & MAG HYDROXIDE-SIMETH 200-200-20 MG/5ML PO SUSP
30.0000 mL | Freq: Four times a day (QID) | ORAL | Status: DC | PRN
  Administered 2024-02-02: 30 mL via ORAL
  Filled 2024-02-02: qty 30

## 2024-02-02 MED ORDER — ALBUTEROL SULFATE (2.5 MG/3ML) 0.083% IN NEBU: 2.5000 mg | INHALATION_SOLUTION | Freq: Four times a day (QID) | RESPIRATORY_TRACT | Status: DC | PRN

## 2024-02-02 MED ORDER — HYDROCODONE-ACETAMINOPHEN 5-325 MG PO TABS
1.0000 | ORAL_TABLET | Freq: Three times a day (TID) | ORAL | Status: DC | PRN
  Administered 2024-02-02: 1 via ORAL
  Filled 2024-02-02: qty 1

## 2024-02-02 MED ORDER — LATANOPROST 0.005 % OP SOLN
1.0000 [drp] | Freq: Every day | OPHTHALMIC | Status: DC
  Administered 2024-02-02 – 2024-02-03 (×2): 1 [drp] via OPHTHALMIC
  Filled 2024-02-02: qty 2.5

## 2024-02-02 MED ORDER — DORZOLAMIDE HCL-TIMOLOL MAL 2-0.5 % OP SOLN
1.0000 [drp] | Freq: Two times a day (BID) | OPHTHALMIC | Status: DC
  Administered 2024-02-02 – 2024-02-04 (×5): 1 [drp] via OPHTHALMIC
  Filled 2024-02-02: qty 10

## 2024-02-02 MED ORDER — ACETAMINOPHEN-CODEINE 300-30 MG PO TABS
1.0000 | ORAL_TABLET | Freq: Three times a day (TID) | ORAL | Status: DC | PRN
  Administered 2024-02-02 – 2024-02-04 (×2): 1 via ORAL
  Filled 2024-02-02 (×2): qty 1

## 2024-02-02 MED ORDER — SODIUM CHLORIDE 0.9% FLUSH
3.0000 mL | Freq: Two times a day (BID) | INTRAVENOUS | Status: DC
  Administered 2024-02-02 – 2024-02-04 (×4): 3 mL via INTRAVENOUS

## 2024-02-02 MED ORDER — ENSURE PLUS HIGH PROTEIN PO LIQD
237.0000 mL | Freq: Two times a day (BID) | ORAL | Status: DC
  Administered 2024-02-02 – 2024-02-04 (×6): 237 mL via ORAL

## 2024-02-02 MED ORDER — POLYETHYLENE GLYCOL 3350 17 G PO PACK
17.0000 g | PACK | Freq: Every day | ORAL | Status: DC
  Administered 2024-02-02 – 2024-02-03 (×2): 17 g via ORAL
  Filled 2024-02-02 (×2): qty 1

## 2024-02-02 MED ORDER — BRIMONIDINE TARTRATE 0.2 % OP SOLN
1.0000 [drp] | Freq: Two times a day (BID) | OPHTHALMIC | Status: DC
  Administered 2024-02-02 – 2024-02-04 (×5): 1 [drp] via OPHTHALMIC
  Filled 2024-02-02: qty 5

## 2024-02-02 MED ORDER — HYDROCODONE-ACETAMINOPHEN 5-325 MG PO TABS: 1.0000 | ORAL_TABLET | Freq: Three times a day (TID) | ORAL | Status: DC | PRN

## 2024-02-02 MED ORDER — ENOXAPARIN SODIUM 30 MG/0.3ML IJ SOSY
30.0000 mg | PREFILLED_SYRINGE | Freq: Every day | INTRAMUSCULAR | Status: DC
  Administered 2024-02-02 – 2024-02-04 (×3): 30 mg via SUBCUTANEOUS
  Filled 2024-02-02 (×3): qty 0.3

## 2024-02-02 MED ORDER — INFLUENZA VAC SPLIT HIGH-DOSE 0.5 ML IM SUSY
0.5000 mL | PREFILLED_SYRINGE | INTRAMUSCULAR | Status: AC
  Administered 2024-02-03: 0.5 mL via INTRAMUSCULAR
  Filled 2024-02-02: qty 0.5

## 2024-02-02 NOTE — Evaluation (Signed)
 Occupational Therapy Evaluation Patient Details Name: [name] MRN: [id number] DOB: 01/29/1936 Today's Date: 02/02/2024   History of Present Illness   [name] is a 88 y.o. person who presented with right hip pain and right lower extremity swelling and admitted for AKI;living with a history of right total hip replacement with prior dislocation, HFpEF, chronic respiratory failure with hypoxia on 2L, GERD, HLD, HTN, right sided back pain, chronic right hip pain     Clinical Impressions This 88 yo female admitted with above presents to acute OT with PLOF of being able to do her own basic ADLs at sponge bath level and RW/rollator. She currently is at a setup-min A level for basic ADLs. She will continue ot benefit from acute OT with follow up HHOT.     If plan is discharge home, recommend the following:   A little help with walking and/or transfers;A little help with bathing/dressing/bathroom;Assist for transportation;Help with stairs or ramp for entrance;Assistance with cooking/housework     Functional Status Assessment   Patient has had a recent decline in their functional status and demonstrates the ability to make significant improvements in function in a reasonable and predictable amount of time.     Equipment Recommendations   None recommended by OT      Precautions/Restrictions   Precautions Precautions: Fall Precaution/Restrictions Comments: on O2, 2 L at baseline Restrictions Weight Bearing Restrictions Per Provider Order: No     Mobility Bed Mobility Overal bed mobility: Needs Assistance Bed Mobility: Supine to Sit, Sit to Supine     Supine to sit: Supervision, HOB elevated Sit to supine: Min assist (for RLE)        Transfers Overall transfer level: Needs assistance Equipment used: Rolling walker (2 wheels) Transfers: Sit to/from Stand Sit to Stand: Contact guard assist                  Balance Overall balance assessment: Needs  assistance Sitting-balance support: No upper extremity supported, Feet supported Sitting balance-Leahy Scale: Good     Standing balance support: Bilateral upper extremity supported, Reliant on assistive device for balance Standing balance-Leahy Scale: Poor                             ADL either performed or assessed with clinical judgement   ADL Overall ADL's : Needs assistance/impaired Eating/Feeding: Independent   Grooming: Set up;Sitting   Upper Body Bathing: With adaptive equipment;Set up;Sitting   Lower Body Bathing: Minimal assistance Lower Body Bathing Details (indicate cue type and reason): CGA sit<>stand Upper Body Dressing : Set up;Sitting   Lower Body Dressing: Minimal assistance Lower Body Dressing Details (indicate cue type and reason): CGA sit<>stand Toilet Transfer: Contact guard assist Toilet Transfer Details (indicate cue type and reason): simulated side step up to [facility] Toileting- Clothing Manipulation and Hygiene: Contact guard assist;Sit to/from stand               Vision Patient Visual Report: No change from baseline              Pertinent Vitals/Pain Pain Assessment Pain Assessment: Faces Faces Pain Scale: Hurts a little bit Pain Location: R lateral hip as she went to try and take her left sock off Pain Descriptors / Indicators: Sore Pain Intervention(s): Limited activity within patient's tolerance, Monitored during session, Repositioned     Extremity/Trunk Assessment Upper Extremity Assessment Upper Extremity Assessment: Overall WFL for tasks assessed  Communication Communication Communication: No apparent difficulties   Cognition Arousal: Alert   Cognition: No apparent impairments                               Following commands: Intact       Cueing    Cueing Techniques: Verbal cues              Home Living Family/patient expects to be discharged to:: Private residence Living  Arrangements: Children;Other relatives (grandson) Available Help at Discharge: Family;Available PRN/intermittently Type of Home: Apartment Home Access: Stairs to enter Entrance Stairs-Number of Steps: 13 Entrance Stairs-Rails: Right;Left Home Layout: One level     Bathroom Shower/Tub: Chief Strategy Officer: Standard     Home Equipment: Rollator (4 wheels);Wheelchair - manual   Additional Comments: Recent move; now with flight of stairs to enter; Pt tells PT she hasn't gone up and down the flight of steps in her new place yet (Except with EMS)      Prior Functioning/Environment Prior Level of Function : Needs assist             Mobility Comments: uses O2 and rollator, independnet to bathroom, in/out of bed; has not been up and down stairs to enter her new apt (moved within the month) yet ADLs Comments: does own sink bath, dresses herself with effort, family cooks    OT Problem List: Decreased strength;Decreased range of motion;Impaired balance (sitting and/or standing);Pain   OT Treatment/Interventions: Self-care/ADL training;DME and/or AE instruction;Balance training;Patient/family education      OT Goals(Current goals can be found in the care plan section)   Acute Rehab OT Goals Patient Stated Goal: to go home soon; easier to get socks off and on OT Goal Formulation: With patient Time For Goal Achievement: 02/16/24 Potential to Achieve Goals: Good   OT Frequency:  Min 2X/week       AM-PAC OT 6 Clicks Daily Activity     Outcome Measure Help from another person eating meals?: None Help from another person taking care of personal grooming?: A Little Help from another person toileting, which includes using toliet, bedpan, or urinal?: A Little Help from another person bathing (including washing, rinsing, drying)?: A Little Help from another person to put on and taking off regular upper body clothing?: A Little Help from another person to put on and  taking off regular lower body clothing?: A Little 6 Click Score: 19   End of Session Equipment Utilized During Treatment: Rolling walker (2 wheels) Nurse Communication: Mobility status  Activity Tolerance: Patient tolerated treatment well Patient left: in bed;with call bell/phone within reach;with bed alarm set  OT Visit Diagnosis: Unsteadiness on feet (R26.81);Other abnormalities of gait and mobility (R26.89);Muscle weakness (generalized) (M62.81);Pain Pain - Right/Left: Right Pain - part of body: Hip                Time: 8659-8641 OT Time Calculation (min): 18 min Charges:  OT General Charges $OT Visit: 1 Visit OT Evaluation $OT Eval Moderate Complexity: 1 Mod  Cathy L. OT Acute Rehabilitation Services Office [phone]    [name] 02/02/2024, 8:19 PM

## 2024-02-02 NOTE — TOC CM/SW Note (Signed)
 Transition of Care [facility]) - Inpatient Brief Assessment   Patient Details  Name: [name] MRN: [id number] Date of Birth: 1935-11-21  Transition of [facility]) CM/SW Contact:    Tom-Johnson, Harvest Muskrat, RN Phone Number: 02/02/2024, 10:09 AM   Clinical Narrative:  Patient presented to the ED with Rt Lower Extremity Pain and Swelling. Patient recently discharged from [facility] ED on 01/24/24 with same complaint. Has hx of RTHR, HFpEF, Back Pain, HTN, GERD, HLD, chronic respiratory Failure on 2L O2 baseline (from [facility]) and BLE Swelling.  Labs in the ED showed Creatine 2.08, BUN 54, Na 132, Anion Gap of 17, Hgb 7.9, BNP 181. Admitted with AKI.  CM spoke with patient at bedside and grand daughter, Crystal via phone about needs for post hospital transition. Patient lives with her daughter who is disabled, her nephew and great grand son. Crystal is patient's primary caregiver, takes patient to and from her appointments. Has a cane, Rollator at home.  PCP is Shawn Sick, MD and uses [facility] on [address].  Patient is currently active with [facility], resumption of order placed, info on AVS. Crystal requested Wheelchair and BSC. Order placed and called in to [facility], Zachary to deliver to patient at bedside.   Patient not Medically ready for discharge.  CM will continue to follow as patient progresses with care towards discharge.             Transition of Care Asessment: Insurance and Status: Insurance coverage has been reviewed Patient has primary care physician: Yes Home environment has been reviewed: Yes Prior level of function:: Modified Assistance Prior/Current Home Services: Current home services ([facility]) Social Drivers of Health Review: SDOH reviewed no interventions necessary Readmission risk has been reviewed: Yes Transition of care needs: transition of care needs identified, TOC will continue to follow

## 2024-02-02 NOTE — H&P (Signed)
 Date: 02/02/2024               Patient Name:  [name] MRN: [id number]  DOB: [birth year] Age / Sex: 88 y.o., female   PCP: Shawn Sick, MD         Medical Service: Internal Medicine Teaching Service         Attending Physician: Dr. Lovie Clarity, MD      First Contact: Schuyler Novak, DO  Pager:  [phone]  Second Contact: Dr. Missy Sandhoff, MD  Pager:  [phone]       After Hours  (After 5pm / First Contact Pager: [phone]  weekends / holidays): Second Contact Pager: [phone]   SUBJECTIVE   Chief Complaint: Right lower extremity pain  History of Present Illness: [name] is a 88 y.o. female with PMH of right total hip replacement with prior dislocation, HFpEF, chronic respiratory failure with hypoxia on 2L, GERD, HLD, HTN, right sided back pain, chronic right hip pain who presented with right hip pain and asymmetric bilateral lower extremity swelling, right worse than left.  She has a history of right hip pain with replacement in the early 2000s. She has had at least three right hip dislocations since having her hip replaced, one of which required closed reduction. She has had noted right greater than left lower extremity swelling in the past and she states that this is the normal pattern for her lower extremity swelling.  Her right hip pain, while chronic, had acutely worsened over the last two weeks without inciting incident or injury. She was seen in the ED on 11/18 for a similar concern. She underwent DVT studies of her right lower extremity which showed no evidence of DVT. She had hip x-rays which showed that her hip was not dislocated. She was treated with pain medication and was discharged.  She has had difficulty walking for the last couple of days with her walker. She denies any trauma or falls. Her pain is localized to her outer hip, groin and thigh. Her pain on presentation was 10/10 but now was 0/10 after receiving medications for this problem. She feels like she is  dragging her leg when she walks. Her pain is present at rest but is worse when she is walking. She has tried to manage her symptoms with Tylenol  #3 with some benefit.  She also tells us  that she has been missing ~20 days of her Lasix . She lost it during her move. The swelling in her lower extremities got worse. She has not experienced shortness of breath, has remained on 2L supplemental [place], and has been using a cPAP machine at home which is her baseline. She has her rescue inhaler for her previously documented asthma. She then picked up her Lasix  4 days ago and has been taking it every day since then. Her son is concerned that she has been peeing too much, and she has not been drinking as much water as normal.  Denies chest pain, nausea, or vomiting. No fevers but stays cold. No URI symptoms.   She eats about twice per day, which is a itttle less than usual. This is because she is not cooking for herself. She and her son tell us  she has been eating a lot more from [facility]. Her urine output is about the same as her normal  ED Course: Vitals were 152/75, 100% on 2L of [place].  Labs significant for Creatine of 2.08, BUN 54, Na of 132, anion gap of 17, Hgb 7.9,  BNP 181  Meds:  Patient does not know what she is taking, only that she takes the pills she has in the bottles Tylenol  #3 Albuterol  as needed Caudet (supposed to have discontinued) Aspirin  81mg  daily Coreg  25mg  daily Chlorthalidone  25mg  Breo Ellipta  Furosemide  40mg  once daily Hydralazine  25mg  TID Losartan  100mg  daily Diclofenac  gel Latanoprost  0.005 solution Current Meds  Medication Sig   acetaminophen  (TYLENOL ) 650 MG CR tablet Take 650 mg by mouth every 8 (eight) hours as needed for pain.    Past Medical History Past Medical History:  Diagnosis Date   Allergic rhinitis, cause unspecified    Asthma    B12 deficiency 06/15/2022   Cancer (HCC)    CHF exacerbation (HCC) 05/08/2022   Chronic back pain    Coronary artery  calcification seen on CAT scan 04/21/2017   Enlarged pulmonary artery (HCC) 04/21/2017   By chest CT   Frequent urination 11/14/2017   GERD (gastroesophageal reflux disease) 05/19/2011   history of Bilateral leg edema-likely amlodipine  induced. 07/28/2011   history of CAP (community acquired pneumonia) 07/14/2011   history of HYPOTHYROIDISM, BORDERLINE 03/20/2006   Annotation: Asymptomatic, untreated Qualifier: Diagnosis of  By: Rogerio MD, Starlyn BIRCH.    Hx of breast cancer    Hyperlipidemia    Hypertension    Hypothyroidism    Hypothyroidism 03/20/2006   Annotation: Asymptomatic, untreated  Qualifier: Diagnosis of   By: Rogerio MD, Starlyn BIRCH.      Lumbago    Murmur, cardiac 11/22/2016   OBESITY NOS 03/20/2006   Qualifier: Diagnosis of  By: Rogerio MD, Veronique D.    OSTEOARTHRITIS 12/13/2005   Annotation: bilateral knees;right knee injection 6/05 Qualifier: Diagnosis of  By: Rogerio MD, Starlyn BIRCH.    Pain, dental 09/18/2020   Preventative health care 09/04/2014   Pulmonary nodule 05/30/2017   4 mm right middle lobe nodule noted on high-resolution CT done on February 06, 2017   right shoulder pain, likely Impingement syndrome  09/09/2010   Upper respiratory infection 04/09/2018   VENOUS INSUFFICIENCY, CHRONIC 03/20/2006   Qualifier: Diagnosis of  By: Rogerio MD, Starlyn BIRCH.     Past Surgical History Past Surgical History:  Procedure Laterality Date   ABDOMINAL HYSTERECTOMY     BREAST LUMPECTOMY     COLONOSCOPY     HERNIA REPAIR     HIP CLOSED REDUCTION Right 11/26/2014   Procedure: CLOSED REDUCTIION RIGHT HIP;  Surgeon: Jerona Harden GAILS, MD;  Location: [facility] OR;  Service: Orthopedics;  Laterality: Right;   INCISIONAL HERNIA REPAIR N/A 10/29/2013   Procedure: OPEN REPAIR OF RECURRENT INCISIONAL HERNIA WITH MESH;  Surgeon: Dann FORBES Hummer, MD;  Location: [facility] OR;  Service: General;  Laterality: N/A;   JOINT REPLACEMENT     TOTAL HIP ARTHROPLASTY      Social:  Lives  With: Daughter and grandson Occupation: Retired Support: Feels well supported with family and community Level of Function: Walks with a walker. She is not able to shower; washes her self in the sink. Does not drive.  PCP: Shawn Sick, MD Substances: -Tobacco: yes, in her 24s.  -Alcohol: None -Recreational Drug:  no, [place] [employer]  Family History:  Family History  Problem Relation Age of Onset   Hypertension Mother    Alzheimer's disease Mother    Diabetes Sister    Colon cancer Neg Hx    Colon polyps Neg Hx    Esophageal cancer Neg Hx    Stomach cancer Neg Hx    Rectal cancer Neg  Hx     Allergies: Allergies as of 02/01/2024 - Review Complete 02/01/2024  Allergen Reaction Noted   Metoprolol  Itching 06/14/2011   Acyclovir and related Itching and Rash 12/09/2014   Food Itching, Rash, and Other (See Comments) 01/28/2016    Review of Systems: A complete ROS was negative except as per HPI.   OBJECTIVE:   Physical Exam: Blood pressure (!) 145/69, pulse 66, temperature 98 F (36.7 C), temperature source Oral, resp. rate 18, SpO2 100%.  Constitutional: alert, obese sitting in bed, in no acute distress HENT: normocephalic atraumatic Eyes: conjunctiva non-erythematous Neck: supple, no JVD, visible carotid pulse with upswing Cardiovascular: regular rate and rhythm, 3/6 systolic murmur. 2+ peripheral pulses at the DP. Assymetric swelling of the bilateral lower extremities. 2+ to the midshin on the right with increased diameter of the thigh compared to the left. 1+ edema on the left with decreased thigh diameter. Pulmonary/Chest: normal work of breathing on 2L, lungs clear to auscultation bilaterally Abdominal: bowel sounds normal, soft, non-tender, non-distended MSK: Pain with logroll of the right hip. Tender over the right greater trochanter. Mild decrease in strength in the bilateral lower extremities, right worse than left. No pain with log roll of the left hip. Right leg  internally rotated Skin: warm and dry. No lesions on bilateral feet. Decreased skin turgor of the bilateral upper extremities.  POCUS: While not ideal windows due to body habitus, visualized IVC was collapsible and non-dilated.    Media Information  Document Information  Photos  Lower extremity swelling  02/02/2024 03:42  Attached To:  Hospital Encounter on 02/01/24  Source Information  Tayden Nichelson, Melvenia, MD  [facility]   Labs: CBC    Component Value Date/Time   WBC 8.9 02/02/2024 0107   RBC 3.01 (L) 02/02/2024 0107   HGB 7.9 (L) 02/02/2024 0107   HGB 11.7 04/27/2022 1135   HCT 25.3 (L) 02/02/2024 0107   HCT 36.3 04/27/2022 1135   PLT 264 02/02/2024 0107   PLT 215 04/27/2022 1135   MCV 84.1 02/02/2024 0107   MCV 85 04/27/2022 1135   MCH 26.2 02/02/2024 0107   MCHC 31.2 02/02/2024 0107   RDW 14.1 02/02/2024 0107   RDW 13.0 04/27/2022 1135   LYMPHSABS 1.3 02/02/2024 0107   LYMPHSABS 1.6 05/26/2021 1218   MONOABS 1.0 02/02/2024 0107   EOSABS 0.2 02/02/2024 0107   EOSABS 0.4 05/26/2021 1218   BASOSABS 0.1 02/02/2024 0107   BASOSABS 0.1 05/26/2021 1218     CMP     Component Value Date/Time   NA 132 (L) 02/02/2024 0107   NA 135 05/31/2023 1009   K 3.5 02/02/2024 0107   CL 84 (L) 02/02/2024 0107   CO2 31 02/02/2024 0107   GLUCOSE 130 (H) 02/02/2024 0107   BUN 54 (H) 02/02/2024 0107   BUN 23 05/31/2023 1009   CREATININE 2.08 (H) 02/02/2024 0107   CREATININE 0.91 09/23/2014 1415   CALCIUM  9.8 02/02/2024 0107   PROT 6.9 02/02/2024 0107   PROT 6.5 01/25/2023 1209   ALBUMIN 3.2 (L) 02/02/2024 0107   ALBUMIN 3.7 01/25/2023 1209   AST 35 02/02/2024 0107   ALT 12 02/02/2024 0107   ALKPHOS 42 02/02/2024 0107   BILITOT 0.9 02/02/2024 0107   BILITOT 0.5 01/25/2023 1209   GFRNONAA 23 (L) 02/02/2024 0107   GFRNONAA 61 09/23/2014 1415   GFRAA 66 12/27/2019 1028   GFRAA 71 09/23/2014 1415    BNP 181  Imaging:  ASSESSMENT & PLAN:  Assessment &  Plan by Problem: Principal Problem:   AKI (acute kidney injury)   [name] is a 88 y.o. person living with a history of right total hip replacement with prior dislocation, HFpEF, chronic respiratory failure with hypoxia on 2L, GERD, HLD, HTN, right sided back pain, chronic right hip pain who presented with right hip pain and right lower extremity swelling and admitted for AKI.  #Acute kidney injury Differential cardiorenal syndrome vs hypovolemia. Patient reports that she was without her Lasix  for about 20 days after losing it during a move, then took it every day for 3 days in the setting of decreased fluid intake. Her volume status is mixed on physical exam, with pitting edema of the bilateral legs, right worse than left, but with decreased skin turgor of the bilateral upper extremities. Her creatinine has increased from 1.08 in March to 2.08 on admission today. BUN elevated to 54. She does have some chronic venous insufficiency as well as a total hip replacement with multiple dislocations that could explain her leg edema in a volume depleted state. She has an elevated BNP of 181. She did not have JVD on physical exam and did not have a dilated IVC on POCUS, although windows were not ideal. Based on her ALW:[id number] ratio of greater than 20, lack of other signs of increased heart pressures, we will treat as a hypovolemic AKI and monitor response. Will also obtain urine studies to attempt to differentiate between the 2 most likely etiologies  - LR now, reassess fluid status in the AM -BMP in the afternoon, if worsening creatinine, consider diuresis. -Urinalysis and urine sodium studies -Hold Losartan  in the setting of AKI  #Right hip pain and right leg swelling S/P THR with recurrent dislocations Increased right hip pain and swelling above baseline without evidence of trauma. Recent ED visit for the same. Pain is well controlled at this time after one dose of of Percocet that she received  in the ED. She recently underwent DVT studies which was negative for DVT. Right hip x-ray at that time showed no dislocation. Likely that her recurrent hip dislocations have caused her chronic asymmetric swelling but this is also exacerbated by her chronic venous insufficiency and mild hypoalbuminemia. Repeat x-ray showed no interval changes.  -Pain control with Tylenol  #3 q8 PRN -PT/OT  #Chronic respiratory failure due to multifactorial process PFT 2 years ago showed severe obstructive and restrictive pattern. On 2L of O2 at home. No increased O2 requirements. No cough. No shortness of breath. Low concern for exacerbation at this time.  -Continue home Breo Ellipta  and rescue albuterol   #HFpEF Last EF 60-65% in 2024. Unable to visualize diastolic parameters on echo in 2024. BNP elevated this admission but has an otherwise complicated volume status as above. Do not think that the patient is in exacerbation at this time.  -Coreg  25mg  BID  #Hyponatremia 132 on admission. Hypovolemic vs thiazide induced. Asymptomatic -Monitor with BMP  #HTN BP on admission moderately elevated. -Continue home Hydralazine  25mg  TID -Continue home chlorthalidone  25mg  daily -Hold Losartan  for AKI -Hold Caduet  per previous PCP note  #Aortic Stenosis Systolic murmur on exam and present on echo. If it is determined that she may be in heart failure, this may be the cause.  #HLD -Discontinue Caudet per previous note  #CAD Coronary artery calcification seen on CT scan -Continue home aspirin  81mg   Diet: Heart Healthy VTE: Enoxaparin  IVF: LR,Bolus Code: DNR/DNI Surrogate Decision Maker: Children  Prior to Admission Living Arrangement: Home,  living with daughter and grandson Anticipated Discharge Location: Home Barriers to Discharge: AKI  Dispo: Admit patient to Observation with expected length of stay less than 2 midnights. Discharge today or tomorrow.  Signed: Napoleon Limes, MD Internal  Medicine Resident PGY-1 02/02/2024, 5:55 AM   Please contact IM Residency On-Call Pager at: [phone] or [phone].

## 2024-02-02 NOTE — Evaluation (Addendum)
 Physical Therapy Evaluation Patient Details Name: [name] MRN: [id number] DOB: [birth year] Today's Date: 02/02/2024  History of Present Illness  [name] is a 88 y.o. person who presented with right hip pain and right lower extremity swelling and admitted for AKI;living with a history of right total hip replacement with prior dislocation, HFpEF, chronic respiratory failure with hypoxia on 2L, GERD, HLD, HTN, right sided back pain, chronic right hip pain  Clinical Impression   Pt admitted with above diagnosis. Lives at home with family, in a single-level home with 13 steps to enter; Prior to admission, pt was able to walk household distances with rollator RW; Presents to PT with RLE pain and edema, generalized weakness, decr activity tolerance; Today, she overall moved well, getting up to sitting EOB using rails, but without without assist, stood from EOB with CGA, and took pivot steps bed to recliner with RW; painful with RLE weight bearing, but able to bear weight and take steps; Pt has a flight of steps to get into her current apartment, and she told me she has been dependent on EMS to carry her up the steps to her home;   Pt currently with functional limitations due to the deficits listed below (see PT Problem List). Pt will benefit from skilled PT to increase their independence and safety with mobility to allow discharge to the venue listed below.       Plan to begin stair training here as able, but pt has a whole flight to enter her home, and anticipate needing non-emergent ambulance transport home      If plan is discharge home, recommend the following: A little help with walking and/or transfers;A little help with bathing/dressing/bathroom;Help with stairs or ramp for entrance;Assist for transportation   Can travel by private vehicle        Equipment Recommendations Other (comment) (shower seat)  Recommendations for Other Services       Functional Status Assessment Patient has  had a recent decline in their functional status and/or demonstrates limited ability to make significant improvements in function in a reasonable and predictable amount of time     Precautions / Restrictions Precautions Precautions: Fall Precaution/Restrictions Comments: on O2, 2 L at baseline Restrictions Weight Bearing Restrictions Per Provider Order: No      Mobility  Bed Mobility Overal bed mobility: Needs Assistance Bed Mobility: Supine to Sit     Supine to sit: Supervision, Used rails     General bed mobility comments: Incr time, used bedrails; no physical assist needed    Transfers Overall transfer level: Needs assistance Equipment used: Rolling walker (2 wheels) Transfers: Sit to/from Stand Sit to Stand: Contact guard assist           General transfer comment: for safety    Ambulation/Gait Ambulation/Gait assistance: Contact guard assist Gait Distance (Feet):  (pivot steps bed to recliner) Assistive device: Rolling walker (2 wheels)         General Gait Details: Good use of RW to steady  Stairs            Wheelchair Mobility     Tilt Bed    Modified Rankin (Stroke Patients Only)       Balance Overall balance assessment: Needs assistance   Sitting balance-Leahy Scale: Good       Standing balance-Leahy Scale: Poor  Pertinent Vitals/Pain Pain Assessment Pain Assessment: Faces Faces Pain Scale: Hurts little more Pain Location: R lateral hip, extending to th eknee Pain Descriptors / Indicators: Aching, Burning Pain Intervention(s): Repositioned, Heat applied    Home Living Family/patient expects to be discharged to:: Private residence Living Arrangements: Children;Other relatives (grandson) Available Help at Discharge: Family;Available PRN/intermittently Type of Home: Apartment Home Access: Stairs to enter Entrance Stairs-Rails: Right;Left Entrance Stairs-Number of Steps: 13   Home  Layout: One level Home Equipment: Rollator (4 wheels);Wheelchair - manual Additional Comments: Recent move; now with flight of stairs to enter; Pt tells PT she hasn't gone up and down the flight of steps in her new place yet (Except with EMS)    Prior Function Prior Level of Function : Needs assist             Mobility Comments: uses O2 and rollator, independnet to bathroom, in/out of bed; has not been up and down stairs to enter her new apt (moved within the month) yet ADLs Comments: does own sink bath, family cooks     Extremity/Trunk Assessment   Upper Extremity Assessment Upper Extremity Assessment: Overall WFL for tasks assessed    Lower Extremity Assessment Lower Extremity Assessment: Generalized weakness;RLE deficits/detail RLE Deficits / Details: Incr edemat compared to LLE; incr soreness with standing, weight bearing       Communication   Communication Communication: No apparent difficulties    Cognition Arousal: Alert Behavior During Therapy: WFL for tasks assessed/performed   PT - Cognitive impairments: No apparent impairments                         Following commands: Intact       Cueing Cueing Techniques: Verbal cues     General Comments General comments (skin integrity, edema, etc.): We discussed setting a stair training goal    Exercises     Assessment/Plan    PT Assessment Patient needs continued PT services  PT Problem List Decreased strength;Decreased activity tolerance;Decreased mobility       PT Treatment Interventions DME instruction;Gait training;Functional mobility training;Therapeutic activities;Therapeutic exercise;Patient/family education;Stair training;Balance training;Manual techniques    PT Goals (Current goals can be found in the Care Plan section)  Acute Rehab PT Goals Patient Stated Goal: be able to get home PT Goal Formulation: With patient Time For Goal Achievement: 02/16/24 Potential to Achieve Goals:  Good    Frequency Min 3X/week     Co-evaluation               AM-PAC PT 6 Clicks Mobility  Outcome Measure Help needed turning from your back to your side while in a flat bed without using bedrails?: None Help needed moving from lying on your back to sitting on the side of a flat bed without using bedrails?: A Little Help needed moving to and from a bed to a chair (including a wheelchair)?: A Little Help needed standing up from a chair using your arms (e.g., wheelchair or bedside chair)?: A Little Help needed to walk in hospital room?: A Little Help needed climbing 3-5 steps with a railing? : A Lot 6 Click Score: 18    End of Session Equipment Utilized During Treatment: Gait belt Activity Tolerance: Patient tolerated treatment well Patient left: in chair;with call bell/phone within reach;with chair alarm set Nurse Communication: Mobility status PT Visit Diagnosis: Unsteadiness on feet (R26.81);Difficulty in walking, not elsewhere classified (R26.2)    Time: 9061-8985 PT Time Calculation (min) (ACUTE ONLY): 36  min   Charges:   PT Evaluation $PT Eval Low Complexity: 1 Low PT Treatments $Therapeutic Activity: 8-22 mins PT General Charges $$ ACUTE PT VISIT: 1 Visit         Silvano Currier, PT  Acute Rehabilitation Services Office [phone] Secure Chat welcomed   Silvano VEAR Currier 02/02/2024, 1:27 PM

## 2024-02-02 NOTE — ED Provider Notes (Signed)
 Emergency Department Provider Note  TRIAGE NOTE: Patient here from home BIB [name] for R hip pain, no falls or injury noted. Pain has been intermittent starting up again this afternoon, describes it as an ache. Hip replacement in same hip. Has been trying heat and tylenol  at home with no relief.   VS: 108/60 BP 98% Spo2 HR 80 Resp 20    HISTORY  Chief Complaint Hip Pain   [name] is a 88 y.o. female with   right-sided leg pain and swelling, which has been progressively worsening. The pain radiates down to the knee and is described as persistent despite the use of Tylenol  3, which provides only partial relief. The patient has also tried patches with minimal benefit. There is no history of recent falls or accidents, and the patient denies any shortness of breath or respiratory issues when lying flat. The leg becomes hot and painful, particularly at night, causing the patient to sit up rather than lie down. The patient has a history of arthritis, which has been previously evaluated with X-rays showing no blood clots or bone displacement. The history was obtained from the patient.  PMH Past Medical History:  Diagnosis Date   Allergic rhinitis, cause unspecified    Asthma    B12 deficiency 06/15/2022   Cancer (HCC)    CHF exacerbation (HCC) 05/08/2022   Chronic back pain    Coronary artery calcification seen on CAT scan 04/21/2017   Enlarged pulmonary artery (HCC) 04/21/2017   By chest CT   Frequent urination 11/14/2017   GERD (gastroesophageal reflux disease) 05/19/2011   history of Bilateral leg edema-likely amlodipine  induced. 07/28/2011   history of CAP (community acquired pneumonia) 07/14/2011   history of HYPOTHYROIDISM, BORDERLINE 03/20/2006   Annotation: Asymptomatic, untreated Qualifier: Diagnosis of  By: Rogerio MD, Starlyn BIRCH.    Hx of breast cancer    Hyperlipidemia    Hypertension    Hypothyroidism    Hypothyroidism 03/20/2006   Annotation:  Asymptomatic, untreated  Qualifier: Diagnosis of   By: Rogerio MD, Starlyn BIRCH.      Lumbago    Murmur, cardiac 11/22/2016   OBESITY NOS 03/20/2006   Qualifier: Diagnosis of  By: Rogerio MD, Veronique D.    OSTEOARTHRITIS 12/13/2005   Annotation: bilateral knees;right knee injection 6/05 Qualifier: Diagnosis of  By: Rogerio MD, Starlyn BIRCH.    Pain, dental 09/18/2020   Preventative health care 09/04/2014   Pulmonary nodule 05/30/2017   4 mm right middle lobe nodule noted on high-resolution CT done on February 06, 2017   right shoulder pain, likely Impingement syndrome  09/09/2010   Upper respiratory infection 04/09/2018   VENOUS INSUFFICIENCY, CHRONIC 03/20/2006   Qualifier: Diagnosis of  By: Rogerio MD, Starlyn BIRCH.     Home Medications Prior to Admission medications   Medication Sig Start Date End Date Taking? Authorizing Provider  acetaminophen  (TYLENOL ) 650 MG CR tablet Take 650 mg by mouth every 8 (eight) hours as needed for pain.   Yes [provider]  acetaminophen -codeine  (TYLENOL  #3) 300-30 MG tablet Take 1 tablet by mouth every 8 (eight) hours as needed for moderate pain (pain score 4-6). 01/19/24 02/18/24  Shawn Sick, MD  albuterol  (VENTOLIN  HFA) 108 [phone] Base) MCG/ACT inhaler INHALE 2 PUFFS INTO THE LUNGS EVERY 6 HOURS AS NEEDED FOR WHEEZING OR SHORTNESS OF BREATH. 03/04/22   Lovie Clarity, MD  amLODipine -atorvastatin  (CADUET ) 10-40 MG tablet Take 1 tablet by mouth daily.    [provider]  aspirin  EC 81 MG tablet Take 81 mg by mouth daily.    [provider]  brimonidine  (ALPHAGAN ) 0.2 % ophthalmic solution Place 1 drop into the left eye 2 (two) times daily. 07/20/21   [provider]  carvedilol  (COREG ) 25 MG tablet TAKE 1 TABLET BY MOUTH TWICE DAILY WITH A MEAL Patient taking differently: Take 25 mg by mouth 2 (two) times daily with a meal. 08/21/23   Lovie Clarity, MD  chlorthalidone  (HYGROTON ) 25 MG tablet Take 12.5 mg by mouth in  the morning.    [provider]  dorzolamide -timolol  (COSOPT ) 2-0.5 % ophthalmic solution Place 1 drop into both eyes 2 (two) times daily.    [provider]  fluticasone  furoate-vilanterol (BREO ELLIPTA ) 200-25 MCG/ACT AEPB INHALE 1 PUFF INTO THE LUNGS DAILY Patient taking differently: Inhale 1 puff into the lungs daily as needed (for flares). 03/20/23   Hope Almarie ORN, NP  furosemide  (LASIX ) 40 MG tablet TAKE 1 TABLET BY MOUTH DAILY AS NEEDED (FOR WEIGHT GAIN OR 3 LBS OVERNIGHT OR 5 LBS IN WEEK) 01/30/24   Shawn Sick, MD  [facility] DICLOFENAC  SODIUM 1 % GEL APPLY 2 GRAMS TOPICALLY 4 TIMES DAILY Patient taking differently: Apply 2 g topically See admin instructions. Apply 2 grams to the affected area four times a day 03/14/23   Lovie Clarity, MD  hydrALAZINE  (APRESOLINE ) 25 MG tablet TAKE 1 TABLET THREE TIMES DAILY 11/24/23   Kate Lonni CROME, MD  latanoprost  (XALATAN ) 0.005 % ophthalmic solution Place 1 drop into the left eye at bedtime. 06/26/21   [provider]  lidocaine  (LIDODERM ) 5 % Place 1 patch onto the skin every 12 (twelve) hours. Remove & Discard patch within 12 hours or as directed by MD 01/30/24   Shawn Sick, MD  losartan  (COZAAR ) 100 MG tablet Take 1 tablet (100 mg total) by mouth daily. 09/19/23   Lovie Clarity, MD  OXYGEN  Inhale 2-3 L/min into the lungs continuous.    [provider]    Social History Social History   Tobacco Use   Smoking status: Former    Current packs/day: 0.00    Average packs/day: 0.2 packs/day for 0.5 years (0.1 ttl pk-yrs)    Types: Cigarettes    Start date: 03/11/1958    Quit date: 09/09/1958    Years since quitting: 65.4   Smokeless tobacco: Never   Tobacco comments:    social smoker  Vaping Use   Vaping status: Never Used  Substance Use Topics   Alcohol use: No    Alcohol/week: 0.0 standard drinks of alcohol   Drug use: No    Review of Systems: Documented in  HPI ____________________________________________  PHYSICAL EXAM: VITAL SIGNS: Triage: Blood pressure (!) 155/63, pulse 76, temperature 98.2 F (36.8 C), temperature source Oral, resp. rate 18, SpO2 100%.  Vitals:   02/01/24 1848 02/01/24 2203 02/02/24 0238 02/02/24 0554  BP: (!) 152/75 (!) 155/63 (!) 145/69 (!) 156/76  Pulse: 74 76 66 76  Resp: 16 18 18 19   Temp: 98.3 F (36.8 C) 98.2 F (36.8 C) 98 F (36.7 C) 98.3 F (36.8 C)  TempSrc: Oral Oral Oral   SpO2: 99% 100% 100% 100%    Physical Exam Vitals and nursing note reviewed.  Constitutional:      Appearance: She is well-developed.  HENT:     Head: Normocephalic and atraumatic.  Cardiovascular:     Rate and Rhythm: Normal rate and regular rhythm.  Pulmonary:     Effort: No respiratory  distress.     Breath sounds: No stridor.  Abdominal:     General: There is no distension.  Musculoskeletal:     Cervical back: Normal range of motion.     Right lower leg: Edema present.     Left lower leg: Edema present.  Neurological:     Mental Status: She is alert.       ____________________________________________   LABS (all labs ordered are listed, but only abnormal results are displayed)  Labs Reviewed  CBC WITH DIFFERENTIAL/PLATELET - Abnormal; Notable for the following components:      Result Value   RBC 3.01 (*)    Hemoglobin 7.9 (*)    HCT 25.3 (*)    All other components within normal limits  COMPREHENSIVE METABOLIC PANEL WITH GFR - Abnormal; Notable for the following components:   Sodium 132 (*)    Chloride 84 (*)    Glucose, Bld 130 (*)    BUN 54 (*)    Creatinine, Ser 2.08 (*)    Albumin 3.2 (*)    GFR, Estimated 23 (*)    Anion gap 17 (*)    All other components within normal limits  BRAIN NATRIURETIC PEPTIDE - Abnormal; Notable for the following components:   B Natriuretic Peptide 181.0 (*)    All other components within normal limits  URINALYSIS, ROUTINE W REFLEX MICROSCOPIC  NA AND K (SODIUM &  POTASSIUM), RAND UR  BASIC METABOLIC PANEL WITH GFR  I-STAT CG4 LACTIC ACID, ED  I-STAT CG4 LACTIC ACID, ED   ____________________________________________  EKG   EKG Interpretation Date/Time:    Ventricular Rate:    PR Interval:    QRS Duration:    QT Interval:    QTC Calculation:   R Axis:      Text Interpretation:          ____________________________________________  RADIOLOGY  DG Chest 1 View Result Date: 02/02/2024 EXAM: 1 VIEW(S) XRAY OF THE CHEST 02/02/2024 05:15:00 AM COMPARISON: AP radiograph of the chest dated 05/07/2022. CLINICAL HISTORY: CHF (congestive heart failure) (HCC) FINDINGS: LUNGS AND PLEURA: Marked elevation of the left hemidiaphragm. Low lung volumes. Mild atelectasis in left lung base. Left lateral pleural thickening. No pleural effusion. No pneumothorax. HEART AND MEDIASTINUM: Calcification is noted within the aortic arch. No acute abnormality of the cardiac and mediastinal silhouettes. BONES AND SOFT TISSUES: No acute osseous abnormality. IMPRESSION: 1. Marked elevation of the left hemidiaphragm. 2. Low lung volumes with mild atelectasis in the left lung base. 3. Left lateral pleural thickening. Electronically signed by: Evalene Coho MD 02/02/2024 05:52 AM EST RP Workstation: [id number]   DG HIP UNILAT WITH PELVIS 2-3 VIEWS RIGHT Result Date: 02/02/2024 EXAM: 2 or 3 VIEW(S) XRAY OF THE RIGHT HIP 02/02/2024 05:15:00 AM COMPARISON: Right hip series dated 01/23/2024. CLINICAL HISTORY: Hip pain. FINDINGS: BONES AND JOINTS: Status post right total hip arthroplasty. There is no adverse interval change. LUMBAR SPINE: There is ankylosis of the lumbar sacral junction. SOFT TISSUES: The soft tissues are unremarkable. IMPRESSION: 1. Status post right total hip arthroplasty with no adverse interval change. 2. Ankylosis of the lumbar sacral junction. Electronically signed by: Evalene Coho MD 02/02/2024 05:50 AM EST RP Workstation: [id number]    ____________________________________________  PROCEDURES  Procedure(s) performed:   Procedures ____________________________________________  INITIAL IMPRESSION / ASSESSMENT AND PLAN   Clinical Course as of 02/02/24 0611  Fri Feb 02, 2024  0021 Initial Evaluation:  Patient presents with worsening right-sided hip pain radiating to the knee, accompanied by  swelling and heaviness. Plan:  Symptomatic treatment with pain management Blood tests to evaluate inflammatory markers and swelling Possible adjustment of current medication regimen Advise on lifestyle modifications Arrange follow-up to monitor response to treatment [JM]    Clinical Course User Index [JM] Lorre Opdahl, Selinda, MD     Images ordered viewed and obtained by myself. Agree with Radiology interpretation. Details in ED course.  Labs ordered reviewed by myself as detailed in ED course.  Consultations obtained/considered detailed in ED course.    CRITICAL INTERVENTIONS:  N/a  Patient found to have an AKI, mildly elevated BNP.  Discussed with her care team and they would prefer admission and so they can work through these issues along some other issues that she has had in the past.  Stable at time of admission.   FINAL IMPRESSION Final diagnoses:  AKI (acute kidney injury)  Lower extremity edema  Right hip pain     Disposition Medical screening exam was performed and I feel the patient has had appropriate emergency department evaluation and work-up for their chief complaint and is stable for ADMISSION to the hospital at this time.  I discussed with resident with the IMTS service and discussed labs, imaging and other work-up in the emergency room.  They agree to admission for further management and work-up of said condition.  ____________________________________________   NEW OUTPATIENT MEDICATIONS STARTED DURING THIS VISIT:  Current Discharge Medication List      Note:  This note was prepared with  assistance of Dragon voice recognition software. Occasional wrong-word or sound-a-like substitutions may have occurred due to the inherent limitations of voice recognition software.    Basheer Molchan, Selinda, MD 02/02/24 [phone]

## 2024-02-02 NOTE — Progress Notes (Signed)
 Pt states she has a CPAP at home but it doesn't work and she would like to get a new one. Pt declined use of CPAP,   states she is fine on the oxygen  she is currently on.

## 2024-02-02 NOTE — Progress Notes (Incomplete)
New Admission Note:   Arrival Method:  Mental Orientation: Telemetry:  Assessment: Completed Skin: IV: Pain:  Tubes: Safety Measures: Safety Fall Prevention Plan has been discussed.  Admission: Completed 5MW Orientation: Patient has been oriented to the room, unit and staff.  Family:  Orders have been reviewed and implemented. Will continue to monitor the patient. Call light has been placed within reach and bed alarm has been activated.   Charito Bokiagon BSN, RN-BC Phone number: [phone] 

## 2024-02-02 NOTE — Hospital Course (Signed)
#  AKI  Likely prerenal in the setting of decreased PO intake and possible overuse of furosemide  (she was without it for 4 days and may have taken multiple doses in an attempt to diurese). SCr on presentation was 2.08 (baseline ~1.0). She received gentle IV fluids with improvement; losartan  and furosemide  were held, and hydration encouraged. By discharge, SCr improved to 1.40 and she is tolerating PO intake. - Resume losartan .  -Resume furosemide . - Continue holding chlorthalidone  until follow-up. - Messaged IMP admin to assist with arranging hospital follow-up (office closed today).   # Right Hip pain  Worked with physical therapy and Occupational Therapy, back to baseline.  PT/OT recommended home health, orders placed.   Hypertension BP remained fairly controlled during hospitalization while continuing carvedilol  25 mg BID. Patient had previously been on multiple antihypertensives (hydralazine  25 mg TID, amlodipine -atorvastatin , carvedilol  25 mg BID, losartan  100 mg). -Continue carvedilol  25 mg twice daily -Resumed losartan  100 mg daily. -Discontinued hydralazine  due to TID dosing burden. -Amlodipine -atorvastatin  was already discontinued per PCP (Dr. Shawn). -Hold chlorthalidone  for now, as patient is euvolemic and recovering from a prerenal AKI.

## 2024-02-02 NOTE — Care Management Obs Status (Signed)
 MEDICARE OBSERVATION STATUS NOTIFICATION   Patient Details  Name: [name] MRN: [id number] Date of Birth: [birth year]   Medicare Observation Status Notification Given:  Yes    Vonzell Arrie Sharps 02/02/2024, 12:25 PM

## 2024-02-03 DIAGNOSIS — D649 Anemia, unspecified: Secondary | ICD-10-CM | POA: Diagnosis not present

## 2024-02-03 DIAGNOSIS — N179 Acute kidney failure, unspecified: Secondary | ICD-10-CM | POA: Diagnosis not present

## 2024-02-03 DIAGNOSIS — K5900 Constipation, unspecified: Secondary | ICD-10-CM

## 2024-02-03 DIAGNOSIS — I10 Essential (primary) hypertension: Secondary | ICD-10-CM

## 2024-02-03 DIAGNOSIS — M25551 Pain in right hip: Secondary | ICD-10-CM | POA: Diagnosis not present

## 2024-02-03 DIAGNOSIS — Z79899 Other long term (current) drug therapy: Secondary | ICD-10-CM | POA: Diagnosis not present

## 2024-02-03 DIAGNOSIS — R600 Localized edema: Secondary | ICD-10-CM | POA: Diagnosis not present

## 2024-02-03 LAB — BASIC METABOLIC PANEL WITH GFR
Anion gap: 9 (ref 5–15)
Anion gap: 11 (ref 5–15)
BUN: 42 mg/dL — ABNORMAL HIGH (ref 8–23)
BUN: 38 mg/dL — ABNORMAL HIGH (ref 8–23)
CO2: 34 mmol/L — ABNORMAL HIGH (ref 22–32)
CO2: 33 mmol/L — ABNORMAL HIGH (ref 22–32)
Calcium: 9.8 mg/dL (ref 8.9–10.3)
Calcium: 9.6 mg/dL (ref 8.9–10.3)
Chloride: 91 mmol/L — ABNORMAL LOW (ref 98–111)
Chloride: 92 mmol/L — ABNORMAL LOW (ref 98–111)
Creatinine, Ser: 1.78 mg/dL — ABNORMAL HIGH (ref 0.44–1.00)
Creatinine, Ser: 1.55 mg/dL — ABNORMAL HIGH (ref 0.44–1.00)
GFR, Estimated: 28 mL/min — ABNORMAL LOW (ref >60)
GFR, Estimated: 33 mL/min — ABNORMAL LOW (ref >60)
Glucose, Bld: 120 mg/dL — ABNORMAL HIGH (ref 70–99)
Glucose, Bld: 128 mg/dL — ABNORMAL HIGH (ref 70–99)
Potassium: 4.1 mmol/L (ref 3.5–5.1)
Potassium: 4 mmol/L (ref 3.5–5.1)
Sodium: 134 mmol/L — ABNORMAL LOW (ref 135–145)
Sodium: 136 mmol/L (ref 135–145)

## 2024-02-03 LAB — TECHNOLOGIST SMEAR REVIEW: Plt Morphology: NORMAL

## 2024-02-03 LAB — CBC
HCT: 21.9 % — ABNORMAL LOW (ref 36.0–46.0)
Hemoglobin: 6.9 g/dL — CL (ref 12.0–15.0)
MCH: 26.4 pg (ref 26.0–34.0)
MCHC: 31.5 g/dL (ref 30.0–36.0)
MCV: 83.9 fL (ref 80.0–100.0)
Platelets: 253 K/uL (ref 150–400)
RBC: 2.61 MIL/uL — ABNORMAL LOW (ref 3.87–5.11)
RDW: 14.1 % (ref 11.5–15.5)
WBC: 7.7 K/uL (ref 4.0–10.5)
nRBC: 0 % (ref 0.0–0.2)

## 2024-02-03 LAB — HEMOGLOBIN AND HEMATOCRIT, BLOOD
HCT: 24.6 % — ABNORMAL LOW (ref 36.0–46.0)
Hemoglobin: 7.9 g/dL — ABNORMAL LOW (ref 12.0–15.0)

## 2024-02-03 LAB — LACTATE DEHYDROGENASE: LDH: 287 U/L — ABNORMAL HIGH (ref 105–235)

## 2024-02-03 LAB — RETICULOCYTES
Immature Retic Fract: 15.9 % (ref 2.3–15.9)
RBC.: 2.87 MIL/uL — ABNORMAL LOW (ref 3.87–5.11)
Retic Count, Absolute: 58.3 K/uL (ref 19.0–186.0)
Retic Ct Pct: 2 % (ref 0.4–3.1)

## 2024-02-03 LAB — PREPARE RBC (CROSSMATCH)

## 2024-02-03 MED ORDER — POLYETHYLENE GLYCOL 3350 17 G PO PACK
34.0000 g | PACK | Freq: Every day | ORAL | Status: DC
  Administered 2024-02-04: 34 g via ORAL
  Filled 2024-02-03: qty 2

## 2024-02-03 MED ORDER — SENNOSIDES-DOCUSATE SODIUM 8.6-50 MG PO TABS: 2.0000 | ORAL_TABLET | Freq: Two times a day (BID) | ORAL | Status: DC | PRN

## 2024-02-03 MED ORDER — SODIUM CHLORIDE 0.9% IV SOLUTION: Freq: Once | INTRAVENOUS | Status: AC

## 2024-02-03 NOTE — Plan of Care (Signed)

## 2024-02-03 NOTE — Plan of Care (Signed)
  Problem: Education: Goal: Knowledge of General Education information will improve Description: Including pain rating scale, medication(s)/side effects and non-pharmacologic comfort measures Outcome: Progressing   Problem: Health Behavior/Discharge Planning: Goal: Ability to manage health-related needs will improve Outcome: Progressing   Problem: Clinical Measurements: Goal: Will remain free from infection Outcome: Progressing   Problem: Clinical Measurements: Goal: Respiratory complications will improve Outcome: Progressing   

## 2024-02-03 NOTE — Progress Notes (Signed)
 Physical Therapy Treatment Patient Details Name: [name] MRN: [id number] DOB: 1936/01/25 Today's Date: 02/03/2024   History of Present Illness [name] is a 88 y.o. person who presented with right hip pain and right lower extremity swelling and admitted for AKI;living with a history of right total hip replacement with prior dislocation, HFpEF, chronic respiratory failure with hypoxia on 2L, GERD, HLD, HTN, right sided back pain, chronic right hip pain    PT Comments  Pt seated in recliner on arrival after just transferring from commode to recliner chair.  Focused on seated and standing exercises for increased strength and activity tolerance.  Pt with noticeable fatigue after short bouts of LE/UE exercises.      If plan is discharge home, recommend the following: A little help with walking and/or transfers;A little help with bathing/dressing/bathroom;Help with stairs or ramp for entrance;Assist for transportation   Can travel by private vehicle        Equipment Recommendations  Other (comment) (shower seat)    Recommendations for Other Services       Precautions / Restrictions Precautions Precautions: Fall Precaution/Restrictions Comments: on O2, 2 L at baseline Restrictions Weight Bearing Restrictions Per Provider Order: No     Mobility  Bed Mobility               General bed mobility comments: Pt seated in recliner on arrival this session.    Transfers Overall transfer level: Needs assistance Equipment used: Rolling walker (2 wheels) Transfers: Sit to/from Stand Sit to Stand: Contact guard assist           General transfer comment: Cues for hand placement to and from seated surface.  Pt slow to ascend but remains able to rise into standing.    Ambulation/Gait Ambulation/Gait assistance: Contact guard assist Gait Distance (Feet):  (marching in place- pt reports fatigue after just getting to recliner post BM with NT.) Assistive device: Rolling walker (2  wheels) Gait Pattern/deviations: Step-to pattern       General Gait Details: Good use of RW to steady-fatigues quickly with 30 steps in place.   Stairs             Wheelchair Mobility     Tilt Bed    Modified Rankin (Stroke Patients Only)       Balance Overall balance assessment: Needs assistance Sitting-balance support: No upper extremity supported, Feet supported Sitting balance-Leahy Scale: Good       Standing balance-Leahy Scale: Poor                              Communication Communication Communication: No apparent difficulties  Cognition Arousal: Alert Behavior During Therapy: WFL for tasks assessed/performed   PT - Cognitive impairments: No apparent impairments                         Following commands: Intact      Cueing Cueing Techniques: Verbal cues  Exercises General Exercises - Lower Extremity Long Arc Quad: AROM, Both, 10 reps, Seated Hip Flexion/Marching: AROM, Both, 10 reps, Seated Shoulder Exercises Shoulder Flexion: AROM, Both, 20 reps, Seated Shoulder Extension: AROM, Both, 20 reps, Seated    General Comments        Pertinent Vitals/Pain Pain Assessment Pain Assessment: Faces Faces Pain Scale: Hurts a little bit Pain Location: R lateral hip Pain Descriptors / Indicators: Sore Pain Intervention(s): Monitored during session, Repositioned  Home Living Family/patient expects to be discharged to:: Private residence Living Arrangements: Children                      Prior Function            PT Goals (current goals can now be found in the care plan section) Acute Rehab PT Goals Patient Stated Goal: be able to get home Potential to Achieve Goals: Good Progress towards PT goals: Progressing toward goals    Frequency    Min 3X/week      PT Plan      Co-evaluation              AM-PAC PT 6 Clicks Mobility   Outcome Measure  Help needed turning from your back to your side  while in a flat bed without using bedrails?: None Help needed moving from lying on your back to sitting on the side of a flat bed without using bedrails?: A Little Help needed moving to and from a bed to a chair (including a wheelchair)?: A Little Help needed standing up from a chair using your arms (e.g., wheelchair or bedside chair)?: A Little Help needed to walk in hospital room?: A Little Help needed climbing 3-5 steps with a railing? : A Lot 6 Click Score: 18    End of Session Equipment Utilized During Treatment: Gait belt Activity Tolerance: Patient tolerated treatment well Patient left: in chair;with call bell/phone within reach;with chair alarm set Nurse Communication: Mobility status PT Visit Diagnosis: Unsteadiness on feet (R26.81);Difficulty in walking, not elsewhere classified (R26.2)     Time: 1215-1224 PT Time Calculation (min) (ACUTE ONLY): 9 min  Charges:    $Therapeutic Exercise: 8-22 mins PT General Charges $$ ACUTE PT VISIT: 1 Visit                     Toya HAMS , PTA Acute Rehabilitation Services Office [phone]    Rubin Dais JINNY Gosling 02/03/2024, 1:00 PM

## 2024-02-03 NOTE — Progress Notes (Signed)
   02/03/24 0539  Provider Notification  Provider Name/Title Smucker,MD  Date Provider Notified 02/03/24  Time Provider Notified 0539  Method of Notification Page  Notification Reason Critical Result (Hgb 6.9)  Provider response See new orders  Date of Provider Response 02/03/24  Time of Provider Response [phone]

## 2024-02-03 NOTE — Progress Notes (Signed)
 HD#0 Subjective:   [name] is a 88 y.o. with a pertinent PMH of chronic respiratory failure on 2 L supplemental oxygen , HFpEF, who presented with worsening right hip pain, and increased right >L leg swelling and admitted for AKI and right hip pain.   Overnight Events: No acute event overnight.  Seen and examined this morning at bedside.  No concerns. Hb 6.9 this morning, no BM for 2 days, no coffee-ground emesis or vomiting.  S/p 1 unit of PRBCs.  Objective:  Vital signs in last 24 hours: Vitals:   02/03/24 0906 02/03/24 0927 02/03/24 0927 02/03/24 1115  BP: (!) 96/56  (!) 114/53 120/69  Pulse: 72  73 77  Resp:      Temp: 98.2 F (36.8 C) 98.4 F (36.9 C)  98.2 F (36.8 C)  TempSrc: Oral   Oral  SpO2: 100%  100% 100%  Height:       Supplemental O2: Nasal Cannula SpO2: 100 % O2 Flow Rate (L/min): 2 L/min   Physical Exam:   General: Pleasant, sitting in bed, NAD  CV: RRR. No m/r/g.  Chronic left leg edema >right Pulmonary: Lungs CTAB. Normal effort. No wheezing or rales. Abdominal: Soft, nontender, nondistended. Normal bowel sounds. Extremities: Palpable dorsalis pedis and radialis pulses. Normal ROM. Skin: Warm and dry.  Psych: Normal mood and affect  There were no vitals filed for this visit.   Intake/Output Summary (Last 24 hours) at 02/03/2024 1624 Last data filed at 02/03/2024 1300 Gross per 24 hour  Intake 561.25 ml  Output 100 ml  Net 461.25 ml   Net IO Since Admission: 1,022.16 mL [02/03/24 1624]  Recent Labs    02/02/24 0846  GLUCAP 167*     Pertinent Labs:    Latest Ref Rng & Units 02/03/2024   11:42 AM 02/03/2024    4:37 AM 02/02/2024    1:07 AM  CBC  WBC 4.0 - 10.5 K/uL  7.7  8.9   Hemoglobin 12.0 - 15.0 g/dL 7.9  6.9  7.9   Hematocrit 36.0 - 46.0 % 24.6  21.9  25.3   Platelets 150 - 400 K/uL  253  264        Latest Ref Rng & Units 02/03/2024   11:42 AM 02/03/2024    4:37 AM 02/02/2024   10:35 AM  CMP  Glucose 70 - 99  mg/dL 871  879  869   BUN 8 - 23 mg/dL 38  42  49   Creatinine 0.44 - 1.00 mg/dL 8.44  8.21  8.10   Sodium 135 - 145 mmol/L 136  134  132   Potassium 3.5 - 5.1 mmol/L 4.0  4.1  3.0   Chloride 98 - 111 mmol/L 92  91  86   CO2 22 - 32 mmol/L 33  34  34   Calcium  8.9 - 10.3 mg/dL 9.6  9.8  9.9     Imaging: No results found.  Assessment/Plan:   Principal Problem:   AKI (acute kidney injury) Active Problems:   Right hip pain   Lower extremity edema   Pressure injury of skin   Patient Summary: [name] is a 88 y.o. with a pertinent PMH of chronic respiratory failure on 2 L supplemental oxygen , HFpEF, who presented with worsening right hip pain, and increased right >L leg swelling and admitted for AKI and right hip pain.   AKI - Prerenal etiology from decreased p.o. intake.  - Kidney function improving, SCr today  1.80 >>1.50.  Baseline SCr 1.0-1.3. - Continue holding ARB. - Patient should be medically ready for discharge tomorrow given improvement in SCr.  Acute on chronic anemia -History of chronic normocytic anemia, her iron panel was normal yesterday. - Hb decreased from 7.9 >>6.9, asymptomatic, etiology unknown.(No BMs or bloody vomit). - LDH mildly increased, otherwise normal reticulocyte count, low suspicion for hemolysis. Posttransfusion H&H thankfully has improved to 7.9 -Will need to follow-up appointment with GI in clinic.  Constipation - No bowel movement for about 2-3 days. - Scheduled MiraLAX  and senna.  Hypertension Normotensive, holding ARB due to AKI.  Hydralazine  discontinued to reduce the pill burden.  Diet: Regular diet IVF: PO intake VTE: enoxaparin  (LOVENOX ) injection 30 mg Start: 02/02/24 1000 Place TED hose Start: 02/02/24 0712 SCDs Start: 02/02/24 0432 Code: Full PT/OT: Home Health ID:  Anti-infectives (From admission, onward)    None       Anticipated discharge to home tomorrow.  Savana Spina, M.D.  Internal Medicine Resident,  PGY-2  4:24 PM, 02/03/2024   Please contact the on call pager after 5 pm and on weekends at [phone].

## 2024-02-04 DIAGNOSIS — N179 Acute kidney failure, unspecified: Secondary | ICD-10-CM | POA: Diagnosis not present

## 2024-02-04 DIAGNOSIS — D649 Anemia, unspecified: Secondary | ICD-10-CM | POA: Diagnosis not present

## 2024-02-04 DIAGNOSIS — M25551 Pain in right hip: Secondary | ICD-10-CM | POA: Diagnosis not present

## 2024-02-04 DIAGNOSIS — I10 Essential (primary) hypertension: Secondary | ICD-10-CM | POA: Diagnosis not present

## 2024-02-04 DIAGNOSIS — R5383 Other fatigue: Secondary | ICD-10-CM | POA: Diagnosis not present

## 2024-02-04 DIAGNOSIS — K5900 Constipation, unspecified: Secondary | ICD-10-CM | POA: Diagnosis not present

## 2024-02-04 DIAGNOSIS — R600 Localized edema: Secondary | ICD-10-CM | POA: Diagnosis not present

## 2024-02-04 DIAGNOSIS — Z743 Need for continuous supervision: Secondary | ICD-10-CM | POA: Diagnosis not present

## 2024-02-04 DIAGNOSIS — Z79899 Other long term (current) drug therapy: Secondary | ICD-10-CM | POA: Diagnosis not present

## 2024-02-04 LAB — TYPE AND SCREEN
ABO/RH(D): O POS
Antibody Screen: NEGATIVE
Unit division: 0

## 2024-02-04 LAB — BASIC METABOLIC PANEL WITH GFR
Anion gap: 12 (ref 5–15)
BUN: 29 mg/dL — ABNORMAL HIGH (ref 8–23)
CO2: 31 mmol/L (ref 22–32)
Calcium: 9.4 mg/dL (ref 8.9–10.3)
Chloride: 92 mmol/L — ABNORMAL LOW (ref 98–111)
Creatinine, Ser: 1.46 mg/dL — ABNORMAL HIGH (ref 0.44–1.00)
GFR, Estimated: 35 mL/min — ABNORMAL LOW (ref >60)
Glucose, Bld: 106 mg/dL — ABNORMAL HIGH (ref 70–99)
Potassium: 4.1 mmol/L (ref 3.5–5.1)
Sodium: 135 mmol/L (ref 135–145)

## 2024-02-04 LAB — BPAM RBC
Blood Product Expiration Date: 202512232359
ISSUE DATE / TIME: 202511290856
Unit Type and Rh: 5100

## 2024-02-04 LAB — CBC
HCT: 27.4 % — ABNORMAL LOW (ref 36.0–46.0)
Hemoglobin: 8.5 g/dL — ABNORMAL LOW (ref 12.0–15.0)
MCH: 26.8 pg (ref 26.0–34.0)
MCHC: 31 g/dL (ref 30.0–36.0)
MCV: 86.4 fL (ref 80.0–100.0)
Platelets: 252 K/uL (ref 150–400)
RBC: 3.17 MIL/uL — ABNORMAL LOW (ref 3.87–5.11)
RDW: 14.7 % (ref 11.5–15.5)
WBC: 8.5 K/uL (ref 4.0–10.5)
nRBC: 0 % (ref 0.0–0.2)

## 2024-02-04 NOTE — Progress Notes (Signed)
 Physical Therapy Treatment Patient Details Name: [name] MRN: [id number] DOB: 11/22/1935 Today's Date: 02/04/2024   History of Present Illness [name] is a 88 y.o. 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 Pt wanted to focus on increasing gait distance in preparation for returning home. Pt was able to progress by ambulating with a RW for 14ft and then 9ft after a short seated rest break. Pt reported that she uses a rollator at home so that she can take seated rest breaks when ambulating through the house. Pt feels comfortable d/c home and has no further questions/concerns. Discussed need for [name] to enter the home due to having multiple steps with pt agreeing. Continue to recommend HHPT with acute PT to follow.  99% SpO2 on 2L at rest 94% SpO2 on 2L during gait    If plan is discharge home, recommend the following: A little help with walking and/or transfers;A little help with bathing/dressing/bathroom;Help with stairs or ramp for entrance;Assist for transportation   Can travel by private vehicle      No  Equipment Recommendations  Other (comment) (shower seat)       Precautions / Restrictions Precautions Precautions: Fall Recall of Precautions/Restrictions: Intact Precaution/Restrictions Comments: on O2, 2 L at baseline Restrictions Weight Bearing Restrictions Per Provider Order: No     Mobility  Bed Mobility Overal bed mobility: Needs Assistance Bed Mobility: Supine to Sit    Supine to sit: Supervision, HOB elevated   Transfers Overall transfer level: Needs assistance Equipment used: Rolling walker (2 wheels) Transfers: Sit to/from Stand Sit to Stand:  Supervision    General transfer comment: supervision for safety, initial cues for hand placement with good carryover    Ambulation/Gait Ambulation/Gait assistance: Supervision Gait Distance (Feet): 30 Feet (x30, x15) Assistive device: Rolling walker (2 wheels) Gait Pattern/deviations: Step-through pattern, Decreased stride length, Trunk flexed Gait velocity: decr    General Gait Details: slightly forward flexed trunk with minimal ability to correct. Slow and steady gait pattern with no overt LOB. One seated rest break in  between sets due to fatigue    Balance Overall balance assessment: Needs assistance Sitting-balance support: No upper extremity supported, Feet supported Sitting balance-Leahy Scale: Good    Standing balance support: Bilateral upper extremity supported, Reliant on assistive device for balance Standing balance-Leahy Scale: Poor Standing balance comment: reliant on UE support     Communication Communication Communication: No apparent difficulties  Cognition Arousal: Alert Behavior During Therapy: WFL for tasks assessed/performed   PT - Cognitive impairments: No apparent impairments    Following commands: Intact      Cueing Cueing Techniques: Verbal cues         Pertinent Vitals/Pain Pain Assessment Pain Assessment: Faces Faces Pain Scale: Hurts a little bit Pain Location: R hip Pain Descriptors / Indicators: Sore Pain Intervention(s): Limited activity within patient's tolerance, Monitored during session, Repositioned     PT Goals (current goals can now be found in the care plan section) Acute Rehab PT Goals PT Goal Formulation: With patient Time For Goal Achievement: 02/16/24 Potential to Achieve Goals: Good Progress towards PT goals: Progressing toward goals    Frequency    Min 3X/week  Co-evaluation   Reason for Co-Treatment: For patient/therapist safety;To address functional/ADL transfers PT goals addressed during  session: Mobility/safety with mobility;Balance;Proper use of DME        AM-PAC PT 6 Clicks Mobility   Outcome Measure  Help needed turning from your back to your side while in a flat bed without using bedrails?: None Help needed moving from lying on your back to sitting on the side of a flat bed without using bedrails?: A Little Help needed moving to and from a bed to a chair (including a wheelchair)?: A Little Help needed standing up from a chair using your arms (e.g., wheelchair or bedside chair)?: A Little Help needed to walk in hospital room?: A Little Help needed climbing 3-5 steps with a railing? : A Lot 6 Click Score: 18    End of Session Equipment Utilized During Treatment: Gait belt;Oxygen  Activity Tolerance: Patient tolerated treatment well Patient left: in chair;with call bell/phone within reach;with chair alarm set Nurse Communication: Mobility status;Other (comment) (need for [name]) PT Visit Diagnosis: Unsteadiness on feet (R26.81);Difficulty in walking, not elsewhere classified (R26.2)     Time: 9190-9166 PT Time Calculation (min) (ACUTE ONLY): 24 min  Charges:    $Gait Training: 8-22 mins PT General Charges $$ ACUTE PT VISIT: 1 Visit                    Kate ORN, PT, DPT Secure Chat Preferred  Rehab Office [phone]    Kate BRAVO Wendolyn 02/04/2024, 9:38 AM

## 2024-02-04 NOTE — Discharge Summary (Cosign Needed Addendum)
 Name: ALAISHA [name] MRN: [id number] DOB: 12-Jul-1935 88 y.o. PCP: Shawn Sick, MD  Date of Admission: 02/01/2024  6:43 PM Date of Discharge: 02/04/2024 Attending Physician: Dr. Shawn Sick  Discharge Diagnosis: Principal Problem:   AKI (acute kidney injury) Active Problems:   Right hip pain   Lower extremity edema   Pressure injury of skin    Discharge Medications: Allergies as of 02/04/2024       Reactions   Metoprolol  Itching   Acyclovir And Related Itching, Rash   Food Itching, Rash, Other (See Comments)   Pt states that she is allergic to pickles.          Medication List     PAUSE taking these medications    chlorthalidone  25 MG tablet Wait to take this until your doctor or other care provider tells you to start again. Commonly known as: HYGROTON  Take 25 mg by mouth every other day.       STOP taking these medications    amLODipine -atorvastatin  10-40 MG tablet Commonly known as: CADUET    hydrALAZINE  25 MG tablet Commonly known as: APRESOLINE        TAKE these medications    acetaminophen  650 MG CR tablet Commonly known as: TYLENOL  Take 650 mg by mouth every 8 (eight) hours as needed for pain.   acetaminophen -codeine  300-30 MG tablet Commonly known as: TYLENOL  #3 Take 1 tablet by mouth every 8 (eight) hours as needed for moderate pain (pain score 4-6).   albuterol  108 (90 Base) MCG/ACT inhaler Commonly known as: VENTOLIN  HFA INHALE 2 PUFFS INTO THE LUNGS EVERY 6 HOURS AS NEEDED FOR WHEEZING OR SHORTNESS OF BREATH.   aspirin  EC 81 MG tablet Take 81 mg by mouth daily.   Breo Ellipta  200-25 MCG/ACT Aepb Generic drug: fluticasone  furoate-vilanterol INHALE 1 PUFF INTO THE LUNGS DAILY   brimonidine  0.2 % ophthalmic solution Commonly known as: ALPHAGAN  Place 1 drop into the left eye 2 (two) times daily.   carvedilol  25 MG tablet Commonly known as: COREG  TAKE 1 TABLET BY MOUTH TWICE DAILY WITH A MEAL   dorzolamide -timolol  2-0.5 %  ophthalmic solution Commonly known as: COSOPT  Place 1 drop into both eyes 2 (two) times daily.   furosemide  40 MG tablet Commonly known as: LASIX  TAKE 1 TABLET BY MOUTH DAILY AS NEEDED (FOR WEIGHT GAIN OR 3 LBS OVERNIGHT OR 5 LBS IN WEEK)   GNP Diclofenac  Sodium 1 % Gel Generic drug: diclofenac  Sodium APPLY 2 GRAMS TOPICALLY 4 TIMES DAILY What changed: See the new instructions.   latanoprost  0.005 % ophthalmic solution Commonly known as: XALATAN  Place 1 drop into the left eye at bedtime.   lidocaine  5 % Commonly known as: Lidoderm  Place 1 patch onto the skin every 12 (twelve) hours. Remove & Discard patch within 12 hours or as directed by MD What changed:  when to take this reasons to take this   losartan  100 MG tablet Commonly known as: COZAAR  Take 1 tablet (100 mg total) by mouth daily.   OXYGEN  Inhale 2-3 L/min into the lungs continuous.        Disposition and follow-up:   [name] was discharged from [facility] in [place] condition.  At the hospital follow up visit please address:  1.  Follow-up:  a.  AKI SCr on discharge was 1.40. Discharge weight: 213 lbs. On the day of discharge, resumed home furosemide  dosing; 40 mg daily as needed (if weight increases >3 lbs in 24 hours or >5 lbs in  one week).  - Assess volume status. - Evaluate whether chlorthalidone  25 mg should be resumed (previously dosed every other day). - Check BMP.  b.  Hypertension Discharged on losartan  100 mg daily and carvedilol  25 mg twice daily - Please confirm patient is not taking hydralazine  and amlodipine -atorvastatin  as this was discontinued.  c.  Acute on chronic normocytic anemia Hb stable at 8.5.  LDH mildly increased, reticulocyte inappropriately normal in setting of anemia; haptoglobin still pending.   - Follow-up on haptoglobin. -Refer patient to GI for further workup of anemia.   2.  Labs / imaging needed at time of follow-up: CBC, BMP  3.  Pending  labs/ test needing follow-up: Haptoglobin   Follow-up Appointments:   Hospital Course by problem list:  #AKI  Likely prerenal in the setting of decreased PO intake and possible overuse of furosemide  (she was without it for 4 days and may have taken multiple doses in an attempt to diurese). SCr on presentation was 2.08 (baseline ~1.0). She received gentle IV fluids with improvement; losartan  and furosemide  were held, and hydration encouraged. By discharge, SCr improved to 1.40 and she is tolerating PO intake. - Resume losartan .  -Resume furosemide . - Continue holding chlorthalidone  until follow-up. - Messaged [facility] admin to assist with arranging hospital follow-up (office closed today).   # Right Hip pain  Worked with physical therapy and Occupational Therapy, back to baseline.  PT/OT recommended home health, orders placed. -   Hypertension BP remained fairly controlled during hospitalization while continuing carvedilol  25 mg BID. Patient had previously been on multiple antihypertensives (hydralazine  25 mg TID, amlodipine -atorvastatin , carvedilol  25 mg BID, losartan  100 mg). -Continue carvedilol  25 mg twice daily -Resumed losartan  100 mg daily. -Discontinued hydralazine  due to TID dosing burden. -Amlodipine -atorvastatin  was already discontinued per PCP (Dr. Shawn). -Hold chlorthalidone  for now, as patient is euvolemic and recovering from a prerenal AKI.    Discharge Subjective: Patient evaluated at bedside this AM.  Discharge Exam:   BP (!) 138/58   Pulse 78   Temp 99.2 F (37.3 C) (Oral)   Resp 18   Ht 5' 2 (1.575 m)   Wt 96.7 kg   SpO2 99%   BMI 38.99 kg/m   General: Pleasant, sitting in bed, NAD  CV: RRR. No m/r/g.  Chronic left leg edema >right Pulmonary: Lungs CTAB. Normal effort. No wheezing or rales. Abdominal: Soft, nontender, nondistended. Normal bowel sounds. Extremities: Palpable dorsalis pedis and radialis pulses. Normal ROM. Skin: Warm and dry.  Psych:  Normal mood and affect    Pertinent Labs, Studies, and Procedures:     Latest Ref Rng & Units 02/04/2024    7:37 AM 02/03/2024   11:42 AM 02/03/2024    4:37 AM  CBC  WBC 4.0 - 10.5 K/uL 8.5   7.7   Hemoglobin 12.0 - 15.0 g/dL 8.5  7.9  6.9   Hematocrit 36.0 - 46.0 % 27.4  24.6  21.9   Platelets 150 - 400 K/uL 252   253        Latest Ref Rng & Units 02/04/2024    7:37 AM 02/03/2024   11:42 AM 02/03/2024    4:37 AM  CMP  Glucose 70 - 99 mg/dL 893  871  879   BUN 8 - 23 mg/dL 29  38  42   Creatinine 0.44 - 1.00 mg/dL 8.53  8.44  8.21   Sodium 135 - 145 mmol/L 135  136  134   Potassium 3.5 - 5.1 mmol/L  4.1  4.0  4.1   Chloride 98 - 111 mmol/L 92  92  91   CO2 22 - 32 mmol/L 31  33  34   Calcium  8.9 - 10.3 mg/dL 9.4  9.6  9.8     DG Chest 1 View Result Date: 02/02/2024 EXAM: 1 VIEW(S) XRAY OF THE CHEST 02/02/2024 05:15:00 AM COMPARISON: AP radiograph of the chest dated 05/07/2022. CLINICAL HISTORY: CHF (congestive heart failure) (HCC) FINDINGS: LUNGS AND PLEURA: Marked elevation of the left hemidiaphragm. Low lung volumes. Mild atelectasis in left lung base. Left lateral pleural thickening. No pleural effusion. No pneumothorax. HEART AND MEDIASTINUM: Calcification is noted within the aortic arch. No acute abnormality of the cardiac and mediastinal silhouettes. BONES AND SOFT TISSUES: No acute osseous abnormality. IMPRESSION: 1. Marked elevation of the left hemidiaphragm. 2. Low lung volumes with mild atelectasis in the left lung base. 3. Left lateral pleural thickening. Electronically signed by: Evalene Coho MD 02/02/2024 05:52 AM EST RP Workstation: [id number]   DG HIP UNILAT WITH PELVIS 2-3 VIEWS RIGHT Result Date: 02/02/2024 EXAM: 2 or 3 VIEW(S) XRAY OF THE RIGHT HIP 02/02/2024 05:15:00 AM COMPARISON: Right hip series dated 01/23/2024. CLINICAL HISTORY: Hip pain. FINDINGS: BONES AND JOINTS: Status post right total hip arthroplasty. There is no adverse interval change. LUMBAR  SPINE: There is ankylosis of the lumbar sacral junction. SOFT TISSUES: The soft tissues are unremarkable. IMPRESSION: 1. Status post right total hip arthroplasty with no adverse interval change. 2. Ankylosis of the lumbar sacral junction. Electronically signed by: Evalene Coho MD 02/02/2024 05:50 AM EST RP Workstation: [id number]     Discharge Instructions: Discharge Instructions     Call MD for:  difficulty breathing, headache or visual disturbances   Complete by: As directed    Call MD for:  persistant dizziness or light-headedness   Complete by: As directed    Diet - low sodium heart healthy   Complete by: As directed    Discharge instructions   Complete by: As directed    - Follow-up with PCP   Increase activity slowly   Complete by: As directed    No wound care   Complete by: As directed        Signed: Celestina Czar, MD 02/04/2024, 4:40 PM   Pager: [phone]

## 2024-02-04 NOTE — Discharge Instructions (Addendum)
 You were hospitalized for feeling weak, your kidneys were found to be dehydrated.  Thank you for allowing us  to be part of your care.   Someone from the internal medicine clinic will call you to set up hospital follow-up.  Do not take chlorthalidone  25 mg until you follow-up with your primary care doctor  Please STOP taking:  - Amlodipine /atorvastatin  - Hydralazine  25 mg TID   Continue taking the following medications:  Carvedilol  25 mg twice daily Losartan  100 mg daily **Furosemide  40 mg daily as needed(for weight gain, or 3 LBS overnight or 5 LBS in a week).  Diclofenac  sodium gel Lidocaine  patch Aspirin  81 mg Breo Ellipta  200-25 mcg Brimonidine  ophthalmologic solution Doxylamine-timolol  2-0.5%  Please make sure to return to the hospital if you have worsening swelling, difficulty breathing.  Please call our clinic if you have any questions or concerns, we may be able to help and keep you from a long and expensive emergency room wait. Our clinic and after hours phone number is [phone], the best time to call is Monday through Friday 9 am to 4 pm but there is always someone available 24/7 if you have an emergency. If you need medication refills please notify your pharmacy one week in advance and they will send us  a request.

## 2024-02-04 NOTE — Progress Notes (Signed)
 Occupational Therapy Treatment and Discharge Patient Details Name: [name] MRN: [id number] DOB: 05-26-1935 Today's Date: 02/04/2024   History of present illness [name] is a 88 y.o. 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 Also educated pt on use of reacher to take off socks and sock aid to put on socks as well as giving her a handout on this equipment so she could get them if she wanted to. No further acute OT needs and pt is to D/C today with HHOT having been recommended. Acute OT will sign off.      If plan is discharge home, recommend the following:  A little help with walking and/or transfers;A little help with bathing/dressing/bathroom;Assist for transportation;Help with stairs or ramp for entrance;Assistance with cooking/housework   Equipment Recommendations  None recommended by OT       Precautions / Restrictions Precautions Precautions: Fall Recall of Precautions/Restrictions: Intact Precaution/Restrictions Comments: on O2, 2 L at baseline Restrictions Weight Bearing Restrictions Per Provider Order: No       Mobility Bed Mobility Overal bed mobility: Needs Assistance Bed Mobility: Supine to Sit     Supine to sit: Supervision, HOB elevated          Transfers Overall transfer level: Needs assistance Equipment used: Rolling walker (2 wheels) Transfers: Sit to/from Stand Sit to Stand: Supervision, From elevated surface           General transfer comment: supervision for safety, initial cues for hand placement with good carryover     Balance Overall balance assessment: Needs  assistance Sitting-balance support: No upper extremity supported, Feet supported Sitting balance-Leahy Scale: Good     Standing balance support: Bilateral upper extremity supported, Reliant on assistive device for balance Standing balance-Leahy Scale: Poor Standing balance comment: reliant on UE support                           ADL either performed or assessed with clinical judgement   ADL Overall ADL's : Needs assistance/impaired                     Lower Body Dressing: Set up;Supervision/safety;Sit to/from stand;With adaptive equipment Lower Body Dressing Details (indicate cue type and reason): sock aid and reacher for doffing and donning socks Toilet Transfer: Contact guard assist;Ambulation;Rolling walker (2 wheels) Toilet Transfer Details (indicate cue type and reason): simulated bed>out into hallway> sat for break>ambulated again> back to room in recliner           General ADL Comments: Gave pt a handout with sock aid and reacher that are recommended for her to get    Extremity/Trunk Assessment Upper Extremity Assessment Upper Extremity Assessment: Overall WFL for tasks assessed            Vision Patient Visual Report: No change from baseline     Perception     Praxis     Communication Communication Communication: No apparent difficulties   Cognition Arousal: Alert Behavior During Therapy: WFL for tasks assessed/performed Cognition: No apparent impairments  Following commands: Intact        Cueing   Cueing Techniques: Verbal cues             Pertinent Vitals/ Pain       Pain Assessment Pain Assessment: Faces Faces Pain Scale: Hurts a little bit Pain Location: R hip Pain Descriptors / Indicators: Sore Pain Intervention(s): Limited activity within patient's tolerance, Monitored during session, Repositioned         Frequency  Min 2X/week        Progress Toward Goals  OT  Goals(current goals can now be found in the care plan section)  Progress towards OT goals: Progressing toward goals  Acute Rehab OT Goals Patient Stated Goal: to go home today OT Goal Formulation: With patient Time For Goal Achievement: 02/16/24 Potential to Achieve Goals: Good  Plan      Co-evaluation    PT/OT/SLP Co-Evaluation/Treatment: Yes Reason for Co-Treatment: For patient/therapist safety;To address functional/ADL transfers PT goals addressed during session: Mobility/safety with mobility;Balance;Proper use of DME OT goals addressed during session: ADL's and self-care;Proper use of Adaptive equipment and DME;Strengthening/ROM      AM-PAC OT 6 Clicks Daily Activity     Outcome Measure   Help from another person eating meals?: None Help from another person taking care of personal grooming?: A Little Help from another person toileting, which includes using toliet, bedpan, or urinal?: A Little Help from another person bathing (including washing, rinsing, drying)?: A Little Help from another person to put on and taking off regular upper body clothing?: A Little Help from another person to put on and taking off regular lower body clothing?: A Little 6 Click Score: 19    End of Session Equipment Utilized During Treatment: Rolling walker (2 wheels)  OT Visit Diagnosis: Unsteadiness on feet (R26.81);Other abnormalities of gait and mobility (R26.89);Muscle weakness (generalized) (M62.81);Pain Pain - Right/Left: Right Pain - part of body: Hip   Activity Tolerance Patient tolerated treatment well   Patient Left in chair;with call bell/phone within reach;with chair alarm set   Nurse Communication Mobility status        Time: 9190-9161 OT Time Calculation (min): 29 min  Charges: OT General Charges $OT Visit: 1 Visit OT Treatments $Self Care/Home Management : 8-22 mins  [name] OT Acute Rehabilitation Services Office [phone]   [name] 02/04/2024, 9:49 AM

## 2024-02-04 NOTE — TOC Transition Note (Signed)
 Transition of Care [facility]) - Discharge Note   Patient Details  Name: [name] MRN: [id number] Date of Birth: [birth year]  Transition of [facility]) CM/SW Contact:  Robynn Eileen Hoose, RN Phone Number: 02/04/2024, 11:43 AM   Clinical Narrative:   Patient is being discharged today. Artavia with [facility] made aware. Spoke with patient, reports needing ambulance transportation home. [name] called to assist with transportation home at discharge.    Final next level of care: Home w Home Health Services Barriers to Discharge: No Barriers Identified   Patient Goals and CMS Choice            Discharge Placement                       Discharge Plan and Services Additional resources added to the After Visit Summary for                                       Social Drivers of Health (SDOH) Interventions SDOH Screenings   Food Insecurity: No Food Insecurity (02/03/2024)  Housing: Low Risk  (02/03/2024)  Transportation Needs: No Transportation Needs (02/03/2024)  Utilities: Not At Risk (02/03/2024)  Alcohol Screen: Low Risk  (06/15/2022)  Depression (PHQ2-9): Low Risk  (11/22/2023)  Financial Resource Strain: Low Risk  (11/22/2023)  Physical Activity: Inactive (11/22/2023)  Social Connections: Moderately Integrated (02/03/2024)  Stress: No Stress Concern Present (11/22/2023)  Tobacco Use: Medium Risk (02/01/2024)  Health Literacy: Inadequate Health Literacy (11/22/2023)     Readmission Risk Interventions     No data to display

## 2024-02-05 LAB — HAPTOGLOBIN: Haptoglobin: <10 mg/dL — ABNORMAL LOW (ref 41–333)

## 2024-02-06 ENCOUNTER — Ambulatory Visit: Payer: Self-pay

## 2024-02-06 NOTE — Telephone Encounter (Signed)
 FYI Only or Action Required?: Action required by provider: request for appointment. Pt only wanting VV.   Patient was last seen in primary care on 10/25/2023 by Shawn Sick, MD.  Called Nurse Triage reporting Hip Pain.  Symptoms began about a month ago.  Symptoms are: unchanged.  Triage Disposition: See PCP When Office is Open (Within 3 Days)  Patient/caregiver understands and will follow disposition?: No, wishes to speak with PCP  Copied from CRM #[id number]. Topic: Clinical - Red Word Triage >> Feb 06, 2024  3:12 PM Carrielelia G wrote: [facility] that prompted transfer to Nurse Triage:  right hip pain, swelling. Has been to the ER recently was supposed to be given pain meds and fluid pills but doctor never gave it to her. Reason for Disposition  [1] MODERATE pain (e.g., interferes with normal activities, limping) AND [2] present > 3 days  Answer Assessment - Initial Assessment Questions 1. LOCATION and RADIATION: Where is the pain located? Does the pain spread (shoot) anywhere else?     R hip pain and swelling 2. QUALITY: What does the pain feel like?  (e.g., sharp, dull, aching, burning)     achy 3. SEVERITY: How bad is the pain? What does it keep you from doing?   (Scale 1-10; or mild, moderate, severe)     6 4. ONSET: When did the pain start? Does it come and go, or is it there all the time?     Over a month 6. CAUSE: What do you think is causing the hip pain?      Arthritis-dx by ED/hosp 7. AGGRAVATING FACTORS: What makes the hip pain worse? (e.g., walking, climbing stairs, running)     In hospital the pills they gave me were real good and helped 8. OTHER SYMPTOMS: Do you have any other symptoms? (e.g., back pain, pain shooting down leg,  fever, rash)     States swelling and redness. Denies fever  Pt states that the doctor in the hospital told her she would be discharged with pain pills and diuretics. Pt was advised to reach out to said hosp, states she was  told by hosp to call PCP. Attempted to schedule pt, pt declined states that she cannot leave her home, states she will need a VV.  Protocols used: Hip Pain-A-AH

## 2024-02-06 NOTE — Telephone Encounter (Signed)
 Return pt's call. Stated she was in the hospital recently for right hip pain; she was told it was arthritis. Stated she was told the doctor will give her something stronger for the pain (than in the hospital) and fluid pills; stated nothing was prescribed. Also now the swelling in her legs is coming back; R>L. She's unable to come in ; requesting a virtual appt. Appt schedule w/ Dr Norrine tomorrow 12/3 @1015AM .

## 2024-02-07 ENCOUNTER — Telehealth: Admitting: Student

## 2024-02-07 DIAGNOSIS — Z748 Other problems related to care provider dependency: Secondary | ICD-10-CM

## 2024-02-07 DIAGNOSIS — Z9181 History of falling: Secondary | ICD-10-CM

## 2024-02-07 DIAGNOSIS — Z79899 Other long term (current) drug therapy: Secondary | ICD-10-CM | POA: Diagnosis not present

## 2024-02-07 DIAGNOSIS — Z9981 Dependence on supplemental oxygen: Secondary | ICD-10-CM | POA: Diagnosis not present

## 2024-02-07 DIAGNOSIS — I5032 Chronic diastolic (congestive) heart failure: Secondary | ICD-10-CM | POA: Diagnosis not present

## 2024-02-07 DIAGNOSIS — M25551 Pain in right hip: Secondary | ICD-10-CM | POA: Diagnosis not present

## 2024-02-07 DIAGNOSIS — R600 Localized edema: Secondary | ICD-10-CM

## 2024-02-07 DIAGNOSIS — Z96641 Presence of right artificial hip joint: Secondary | ICD-10-CM

## 2024-02-07 NOTE — Progress Notes (Signed)
 Patient name: [name] Date of birth: December 06, 1935 Date of visit: 02/07/24  This visit was conducted via a synchronous, secure telehealth platform using real-time audio/video communication.   The patient was located in her home, and I was in the [facility] at the time of the visit.  Patient identity and location were verified at the start of the encounter. Verbal consent was obtained for the telehealth visit, including discussion of the risks, benefits, and limitations of remote care.   This encounter was conducted in accordance with applicable federal and state telehealth laws and payer-specific guidelines.  I spent 19 minutes on the phone with the patient.  Subjective  Reason for visit: right hip pain and right leg swelling Discussed the use of AI scribe software for clinical note transcription with the patient, who gave verbal consent to proceed.  History of Present Illness   [name] is an 88 year old female who presents with right leg swelling and hip pain. She is accompanied by her daughter, Brittany.  Right lower extremity edema and erythema - Right leg swelling present for approximately one month - Swelling is pitting and persistent despite as-needed use of Lasix  (furosemide ) - Redness and warmth localized around the ankle, with increased heat prior to recent hospital visit - Diuretics during recent hospitalization provided temporary improvement - No fever reported  Right hip pain - History of right hip replacement in 2006 - Intermittent hip pain since surgery, with increased pain over the past month - Pain described as a deep ache, not fully relieved by Tylenol  3 - Recent x-rays demonstrated arthritis without other abnormalities - History of falls approximately every three years, with last fall less than three years ago  Respiratory status - Daily oxygen  use - No new shortness of breath  Functional status and weight - Engages in home exercises and  walking - Weight recorded as 211 pounds during recent hospitalization       Outpatient Medications Prior to Visit  Medication Sig   acetaminophen  (TYLENOL ) 650 MG CR tablet Take 650 mg by mouth every 8 (eight) hours as needed for pain.   acetaminophen -codeine  (TYLENOL  #3) 300-30 MG tablet Take 1 tablet by mouth every 8 (eight) hours as needed for moderate pain (pain score 4-6).   albuterol  (VENTOLIN  HFA) 108 (90 Base) MCG/ACT inhaler INHALE 2 PUFFS INTO THE LUNGS EVERY 6 HOURS AS NEEDED FOR WHEEZING OR SHORTNESS OF BREATH.   aspirin  EC 81 MG tablet Take 81 mg by mouth daily.   brimonidine  (ALPHAGAN ) 0.2 % ophthalmic solution Place 1 drop into the left eye 2 (two) times daily.   carvedilol  (COREG ) 25 MG tablet TAKE 1 TABLET BY MOUTH TWICE DAILY WITH A MEAL   [Paused] chlorthalidone  (HYGROTON ) 25 MG tablet Take 25 mg by mouth every other day.   dorzolamide -timolol  (COSOPT ) 2-0.5 % ophthalmic solution Place 1 drop into both eyes 2 (two) times daily.   fluticasone  furoate-vilanterol (BREO ELLIPTA ) 200-25 MCG/ACT AEPB INHALE 1 PUFF INTO THE LUNGS DAILY   furosemide  (LASIX ) 40 MG tablet TAKE 1 TABLET BY MOUTH DAILY AS NEEDED (FOR WEIGHT GAIN OR 3 LBS OVERNIGHT OR 5 LBS IN WEEK)   GNP DICLOFENAC  SODIUM 1 % GEL APPLY 2 GRAMS TOPICALLY 4 TIMES DAILY (Patient taking differently: Apply 2 g topically 4 (four) times daily as needed (pain).)   latanoprost  (XALATAN ) 0.005 % ophthalmic solution Place 1 drop into the left eye at bedtime.   lidocaine  (LIDODERM ) 5 % Place 1 patch onto the  skin every 12 (twelve) hours. Remove & Discard patch within 12 hours or as directed by MD (Patient taking differently: Place 1 patch onto the skin daily as needed (pain). Remove & Discard patch within 12 hours or as directed by MD)   losartan  (COZAAR ) 100 MG tablet Take 1 tablet (100 mg total) by mouth daily.   OXYGEN  Inhale 2-3 L/min into the lungs continuous.   No facility-administered medications prior to visit.      Objective  Physical Exam Constitutional:      Appearance: Normal appearance.  Pulmonary:     Comments: On supplemental oxygen  via nasal cannula Neurological:     Mental Status: She is alert.     Cranial Nerves: No facial asymmetry.  Psychiatric:        Mood and Affect: Affect normal.        Speech: Speech normal.        Behavior: Behavior normal.         Assessment & Plan Lower extremity edema Right > left. With history of chronic diastolic CHF, on furosemide  40 mg PRN. This was restarted after recent hospitalization for AKI, attributed to overuse of diuretics and prerenal disease. DVT study a few weeks ago was negative for DVT in right leg. Query lymphedema. Volume overload unlikely to present with asymmetric edema. Need to see her in person for hospital follow-up, BP check, creatinine. No medication changes now.    Assistance needed with transportation She has difficulty making it to and from appointments due to limited mobility and chronic hypoxic respiratory failure. Her daughter will help to arrange medical transportation for an appointment on Thursday or Friday this week to see me for hospital follow-up and leg swelling.    Return 1-2 days for hospital follow-up and leg swelling.  Ozell Kung MD 02/07/2024, 11:14 AM

## 2024-02-07 NOTE — Patient Instructions (Signed)
 VISIT SUMMARY: Today, we addressed your right leg swelling and hip pain. We also reviewed your respiratory status and overall functional status.  YOUR PLAN: RIGHT HIP PAIN AND OSTEOARTHRITIS: You have chronic right hip pain following your hip replacement surgery, and the current pain medication is not fully effective. -Continue taking Tylenol  3 for pain management.  RIGHT LOWER EXTREMITY EDEMA: You have chronic swelling in your right leg that has recently worsened and is not well controlled with your current medication. -Monitor for signs of worsening edema, increased redness, or fever. -Schedule an in-person clinic appointment for further evaluation and management. -Seek urgent care if you experience increased shortness of breath, fever, or rapid worsening of leg symptoms.  Remember to bring all of the medications that you take (including over the counter medications and supplements) with you to every clinic visit.  This after visit summary is an important review of tests, referrals, and medication changes that were discussed during your visit. If you have questions or concerns, call [phone]. Outside of clinic business hours, call the main hospital at [phone] and ask the operator for the on-call internal medicine resident.   Ozell Kung MD 02/07/2024, 11:13 AM

## 2024-02-07 NOTE — Assessment & Plan Note (Signed)
 Right > left. With history of chronic diastolic CHF, on furosemide  40 mg PRN. This was restarted after recent hospitalization for AKI, attributed to overuse of diuretics and prerenal disease. DVT study a few weeks ago was negative for DVT in right leg. Query lymphedema. Volume overload unlikely to present with asymmetric edema. Need to see her in person for hospital follow-up, BP check, creatinine. No medication changes now.

## 2024-02-08 ENCOUNTER — Ambulatory Visit: Payer: Self-pay

## 2024-02-08 NOTE — Progress Notes (Signed)
 [facility] Attending  Case discussed with the resident at the time of the visit.  We reviewed the resident's history and exam and pertinent patient test results.  I agree with the assessment, diagnosis, and plan of care documented in the resident's note with the following correction.  Recent DVT study negative for the RIGHT leg. Ms. [name] has had multiple dislocations and complications from prior right hip arthroplasty, declined revision per orthopedic notes. Question whether further imaging is needed to see if there is a compressive lesion causing asymmetric swelling. Will f/u this week for further examination and lab evaluation.

## 2024-02-08 NOTE — Telephone Encounter (Signed)
 FYI Only or Action Required?: FYI only for provider: appointment scheduled on 12.11.25.  Patient was last seen in primary care on 02/07/2024 by Norrine Sharper, MD.  Called Nurse Triage reporting Leg Swelling.  Symptoms began a week ago.  Interventions attempted: Prescription medications: lasix .  Symptoms are: unchanged.  Triage Disposition: See Physician Within 24 Hours  Patient/caregiver understands and will follow disposition?: No, wishes to speak with PCP  Dr. Garrett note said to see him in office Thursday or Friday this week, when RN read that to pt and said is next available was Monday. She stated she can only schedule for Thursday or Friday of NEXT week due to ride.    Copied from CRM #[id number]. Topic: Clinical - Red Word Triage >> Feb 08, 2024  9:21 AM Alfonso ORN wrote: Red Word that prompted transfer to Nurse Triage: Swelling in right leg   ----------------------------------------------------------------------- From previous Reason for Contact - Scheduling: Patient/patient representative is calling to schedule an appointment. Refer to attachments for appointment information. Reason for Disposition  [1] MODERATE leg swelling (e.g., swelling extends up to knees) AND [2] new-onset or getting worse  Answer Assessment - Initial Assessment Questions Pt was seen virtually with Dr. Norrine yesterday. He recommended that she come into the office so she can be evaluated, his note says Thursday or Friday of this week. His next available was Monday. Pt states she can't do anything this week and only Thursday or Friday of next week work. RN did give instructions that if anything changes at all in the next couple of days to give a call back immediately. Pt denies any chest pain or shortness of breath. States her light leg is swollen to slightly above the knee. She is still taking the lasix  40mg  daily. States leg is red and can leave a dent if you push on it. Was dc from hospital  11.30.25  1. ONSET: When did the swelling start? (e.g., minutes, hours, days)     Around thanksgiving 2. LOCATION: What part of the leg is swollen?  Are both legs swollen or just one leg?     Right leg to the just above the knee 3. SEVERITY: How bad is the swelling? (e.g., localized; mild, moderate, severe)     moderate 4. REDNESS: Is there redness or signs of infection?     States there is redness 5. PAIN: Is the swelling painful to touch? If Yes, ask: How painful is it?   (Scale 1-10; mild, moderate or severe)     no 6. FEVER: Do you have a fever? If Yes, ask: What is it, how was it measured, and when did it start?      no 7. CAUSE: What do you think is causing the leg swelling?     uknown 8. MEDICAL HISTORY: Do you have a history of blood clots (e.g., DVT), cancer, heart failure, kidney disease, or liver failure?     Chf, kidney disease 9. RECURRENT SYMPTOM: Have you had leg swelling before? If Yes, ask: When was the last time? What happened that time?     yes 10. OTHER SYMPTOMS: Do you have any other symptoms? (e.g., chest pain, difficulty breathing)       denies  Protocols used: Leg Swelling and Edema-A-AH

## 2024-02-15 ENCOUNTER — Ambulatory Visit: Payer: Self-pay | Admitting: Student

## 2024-02-15 VITALS — BP 133/80 | HR 76 | Temp 98.4°F | Ht 62.0 in | Wt 216.2 lb

## 2024-02-15 DIAGNOSIS — R600 Localized edema: Secondary | ICD-10-CM

## 2024-02-15 DIAGNOSIS — I10 Essential (primary) hypertension: Secondary | ICD-10-CM

## 2024-02-15 NOTE — Assessment & Plan Note (Addendum)
 BP Readings from Last 3 Encounters:  02/15/24 133/80  02/04/24 (!) 138/58  01/24/24 126/75   Chronic and stable.  At goal right now although she is not sure about the medicines that she takes.  Coreg  25 mg twice daily, losartan  100 mg daily, in addition to furosemide  40 mg daily as needed for weight gain are all prescribed.  Her chlorthalidone  was held on recent hospital discharge which I agree with.  She needs med rec which can be done asynchronously or via telehealth visit.  Orders:   Basic metabolic panel with GFR

## 2024-02-15 NOTE — Progress Notes (Signed)
 Patient name: [name] Date of birth: 1935-06-26 Date of visit: 02/15/2024  Subjective  Reason for visit: Knee Pain (CHRONIC PAIN KNEE AND HIP  # 10) and Medication Refill  Discussed the use of AI scribe software for clinical note transcription with the patient, who gave verbal consent to proceed.  History of Present Illness   [name] is an 88 year old female who presents with right leg swelling and pain.  Right lower extremity edema and pain - Marked swelling and erythema of the right leg from ankle to hip - Constant aching pain, worsened by pressure or massage - Symptoms began a few months ago, she has visited the hospital a couple of times for this, DVT has been ruled out, she usually gets better after couple of days of compression and bedrest.   - Temporary improvement with in-hospital compression and elevation, but recurrence within 24 hours at home - Swelling does not improve with home diuretic therapy (Lasix ) - No prior episodes of similar swelling - No history of right leg surgery aside from right hip replacement in 2016  Systemic symptoms - Intermittent chills and sensation of feeling cold - No fever or shaking chills  Pain management - Tylenol  3 provides good pain relief  Mobility and support - Receives weekly in-home assistance for exercises and mobility support       Outpatient Medications Prior to Visit  Medication Sig   acetaminophen  (TYLENOL ) 650 MG CR tablet Take 650 mg by mouth every 8 (eight) hours as needed for pain.   acetaminophen -codeine  (TYLENOL  #3) 300-30 MG tablet Take 1 tablet by mouth every 8 (eight) hours as needed for moderate pain (pain score 4-6).   albuterol  (VENTOLIN  HFA) 108 (90 Base) MCG/ACT inhaler INHALE 2 PUFFS INTO THE LUNGS EVERY 6 HOURS AS NEEDED FOR WHEEZING OR SHORTNESS OF BREATH.   aspirin  EC 81 MG tablet Take 81 mg by mouth daily.   brimonidine  (ALPHAGAN ) 0.2 % ophthalmic solution Place 1 drop into the left eye 2 (two) times  daily.   carvedilol  (COREG ) 25 MG tablet TAKE 1 TABLET BY MOUTH TWICE DAILY WITH A MEAL   [Paused] chlorthalidone  (HYGROTON ) 25 MG tablet Take 25 mg by mouth every other day.   dorzolamide -timolol  (COSOPT ) 2-0.5 % ophthalmic solution Place 1 drop into both eyes 2 (two) times daily.   fluticasone  furoate-vilanterol (BREO ELLIPTA ) 200-25 MCG/ACT AEPB INHALE 1 PUFF INTO THE LUNGS DAILY   furosemide  (LASIX ) 40 MG tablet TAKE 1 TABLET BY MOUTH DAILY AS NEEDED (FOR WEIGHT GAIN OR 3 LBS OVERNIGHT OR 5 LBS IN WEEK)   GNP DICLOFENAC  SODIUM 1 % GEL APPLY 2 GRAMS TOPICALLY 4 TIMES DAILY (Patient taking differently: Apply 2 g topically 4 (four) times daily as needed (pain).)   latanoprost  (XALATAN ) 0.005 % ophthalmic solution Place 1 drop into the left eye at bedtime.   lidocaine  (LIDODERM ) 5 % Place 1 patch onto the skin every 12 (twelve) hours. Remove & Discard patch within 12 hours or as directed by MD (Patient taking differently: Place 1 patch onto the skin daily as needed (pain). Remove & Discard patch within 12 hours or as directed by MD)   losartan  (COZAAR ) 100 MG tablet Take 1 tablet (100 mg total) by mouth daily.   OXYGEN  Inhale 2-3 L/min into the lungs continuous.   No facility-administered medications prior to visit.     Objective  Today's Vitals   02/15/24 1318  BP: 133/80  Pulse: 76  Temp: 98.4 F (36.9  C)  TempSrc: Oral  SpO2: 98%  Weight: 216 lb 3.2 oz (98.1 kg)  Height: 5' 2 (1.575 m)  PainSc: 10-Worst pain ever  PainLoc: Knee  Body mass index is 39.54 kg/m.   Physical Exam Constitutional:      Appearance: Normal appearance.  Cardiovascular:     Rate and Rhythm: Normal rate and regular rhythm.     Comments: Normal DP pulse on the left, cannot palpate on the right because of the edema but her right foot is warm and looks perfused. Pulmonary:     Effort: Pulmonary effort is normal. No respiratory distress.     Breath sounds: No rales.  Musculoskeletal:     Comments: Right  leg with pitting edema to thigh, left leg looks normal  Skin:    General: Skin is warm and dry.     Comments: Some redness and weeping around the ankle of the lower right leg  Neurological:     Mental Status: She is alert.     Cranial Nerves: No facial asymmetry.  Psychiatric:        Mood and Affect: Affect normal.        Speech: Speech normal.        Behavior: Behavior normal.   Results Reviewed lower extremity venous ultrasound of right leg from 01/24/2024 which showed no evidence of DVT in the lower extremity but the study was somewhat limited      Assessment & Plan Essential hypertension BP Readings from Last 3 Encounters:  02/15/24 133/80  02/04/24 (!) 138/58  01/24/24 126/75   Chronic and stable.  At goal right now although she is not sure about the medicines that she takes.  Coreg  25 mg twice daily, losartan  100 mg daily, in addition to furosemide  40 mg daily as needed for weight gain are all prescribed.  Her chlorthalidone  was held on recent hospital discharge which I agree with.  She needs med rec which can be done asynchronously or via telehealth visit.  Orders:   Basic metabolic panel with GFR   Leg edema, right Subacute to chronic over the last few months, recurrent.  Suspect that she has some venous insufficiency in this leg, but not sure why it is so asymmetric and with edema all the way to hip.  Check CT pelvis for any compressing masses.  She may also have some lymphedema.  Supportive care with compression and elevation for now.  She has venous stasis dermatitis on the distal leg but no infection.  Orders:   Basic metabolic panel with GFR   CT PELVIS WO CONTRAST; Future   Return if symptoms worsen or fail to improve.  Ozell Kung MD 02/15/2024, 2:10 PM

## 2024-02-15 NOTE — Patient Instructions (Signed)
 VISIT SUMMARY: Today, we addressed the swelling and pain in your right leg, which has been persistent since your recent hospital visit. We also reviewed your blood pressure management and provided instructions to ensure you are taking the correct medications.  YOUR PLAN: CHRONIC VENOUS INSUFFICIENCY, RIGHT LOWER EXTREMITY: You have swelling and redness in your right leg due to poor blood flow in your veins. This is not related to fluid overload in your body. -Use thigh-high compression stockings or wraps to help reduce the swelling. -Elevate your leg above heart level when you are sitting or lying down. -Consider using a wheelchair for mobility, but continue walking as much as you can tolerate. -We have ordered some lab tests to check your overall health.  ESSENTIAL HYPERTENSION: Your blood pressure is well-controlled with your current medications, but there has been some confusion about your medication regimen since your recent hospital discharge. -We provided you with a list of your current medications to ensure you are taking them correctly. -Please gather all your medications at home and bring them in for review at your next visit.  Take your prescription medications as usual on the day of your doctor visit. Unless specifically instructed, there is no need to fast prior to laboratory blood testing.  Bring all of the medications that you take (including over the counter medications and supplements) with you to every clinic visit.  This after visit summary is an important review of tests, referrals, and medication changes that were discussed during your visit. If you have questions or concerns, call [phone]. Outside of clinic business hours, call the main hospital at [phone] and ask the operator for the on-call internal medicine resident.   Ozell Kung MD 02/15/2024, 2:03 PM

## 2024-02-15 NOTE — Assessment & Plan Note (Addendum)
 Subacute to chronic over the last few months, recurrent.  Suspect that she has some venous insufficiency in this leg, but not sure why it is so asymmetric and with edema all the way to hip.  Check CT pelvis for any compressing masses.  She may also have some lymphedema.  Supportive care with compression and elevation for now.  She has venous stasis dermatitis on the distal leg but no infection.  Orders:   Basic metabolic panel with GFR   CT PELVIS WO CONTRAST; Future

## 2024-02-15 NOTE — Progress Notes (Signed)
 Internal Medicine Clinic Attending  Case discussed with the resident at the time of the visit.  We reviewed the resident's history and exam and pertinent patient test results.  I agree with the assessment, diagnosis, and plan of care documented in the resident's note.

## 2024-02-15 NOTE — Assessment & Plan Note (Deleted)
 Right > left. With history of chronic diastolic CHF, on furosemide  40 mg PRN. This was restarted after recent hospitalization for AKI, attributed to overuse of diuretics and prerenal disease. DVT study a few weeks ago was negative for DVT in right leg. Query lymphedema. Volume overload unlikely to present with asymmetric edema. Need to see her in person for hospital follow-up, BP check, creatinine. No medication changes now.

## 2024-02-16 ENCOUNTER — Telehealth: Payer: Self-pay | Admitting: *Deleted

## 2024-02-16 ENCOUNTER — Ambulatory Visit: Payer: Self-pay | Admitting: Student

## 2024-02-16 DIAGNOSIS — R7989 Other specified abnormal findings of blood chemistry: Secondary | ICD-10-CM

## 2024-02-16 DIAGNOSIS — Z9189 Other specified personal risk factors, not elsewhere classified: Secondary | ICD-10-CM

## 2024-02-16 DIAGNOSIS — I10 Essential (primary) hypertension: Secondary | ICD-10-CM

## 2024-02-16 LAB — BASIC METABOLIC PANEL WITH GFR
BUN/Creatinine Ratio: 15 (ref 12–28)
BUN: 22 mg/dL (ref 8–27)
CO2: 27 mmol/L (ref 20–29)
Calcium: 10.4 mg/dL — ABNORMAL HIGH (ref 8.7–10.3)
Chloride: 94 mmol/L — ABNORMAL LOW (ref 96–106)
Creatinine, Ser: 1.44 mg/dL — ABNORMAL HIGH (ref 0.57–1.00)
Glucose: 118 mg/dL — ABNORMAL HIGH (ref 70–99)
Potassium: 4.1 mmol/L (ref 3.5–5.2)
Sodium: 136 mmol/L (ref 134–144)
eGFR: 35 mL/min/1.73 — ABNORMAL LOW (ref >59)

## 2024-02-16 NOTE — Telephone Encounter (Signed)
 Copied from CRM #[id number]. Topic: Clinical - Medication Question >> Feb 16, 2024 10:26 AM Farrel B wrote: Reason for CRM: list of meds  Pantoprazole  (PROTONIX ) 40mg  Hyrdralazine (Apresoline )25mg  Furosemide  (Lasix ) 40mg  Carvedilol  25mg  Losartan  100mg    Pt daughter Alexine Pilant advised me that the doctor wanted the names of the medications the pt is currently take per the request of Dr. Dorinda. Some of the medications show they have been discontinued but pt is currently still taking.

## 2024-02-16 NOTE — Telephone Encounter (Signed)
 Per daughter, these are the meds she is taking at home:  Pantoprazole  (PROTONIX ) 40mg  Hyrdralazine (Apresoline )25mg  Furosemide  (Lasix ) 40mg  Carvedilol  25mg  Losartan  100mg    Her current active medication list:  Outpatient Medications Prior to Visit  Medication Sig   hydrALAZINE  (APRESOLINE ) 25 MG tablet Take 25 mg by mouth 3 (three) times daily.   acetaminophen  (TYLENOL ) 650 MG CR tablet Take 650 mg by mouth every 8 (eight) hours as needed for pain.   acetaminophen -codeine  (TYLENOL  #3) 300-30 MG tablet Take 1 tablet by mouth every 8 (eight) hours as needed for moderate pain (pain score 4-6).   albuterol  (VENTOLIN  HFA) 108 (90 Base) MCG/ACT inhaler INHALE 2 PUFFS INTO THE LUNGS EVERY 6 HOURS AS NEEDED FOR WHEEZING OR SHORTNESS OF BREATH.   aspirin  EC 81 MG tablet Take 81 mg by mouth daily.   brimonidine  (ALPHAGAN ) 0.2 % ophthalmic solution Place 1 drop into the left eye 2 (two) times daily.   carvedilol  (COREG ) 25 MG tablet TAKE 1 TABLET BY MOUTH TWICE DAILY WITH A MEAL   dorzolamide -timolol  (COSOPT ) 2-0.5 % ophthalmic solution Place 1 drop into both eyes 2 (two) times daily.   fluticasone  furoate-vilanterol (BREO ELLIPTA ) 200-25 MCG/ACT AEPB INHALE 1 PUFF INTO THE LUNGS DAILY   furosemide  (LASIX ) 40 MG tablet TAKE 1 TABLET BY MOUTH DAILY AS NEEDED (FOR WEIGHT GAIN OR 3 LBS OVERNIGHT OR 5 LBS IN WEEK)   GNP DICLOFENAC  SODIUM 1 % GEL APPLY 2 GRAMS TOPICALLY 4 TIMES DAILY (Patient taking differently: Apply 2 g topically 4 (four) times daily as needed (pain).)   latanoprost  (XALATAN ) 0.005 % ophthalmic solution Place 1 drop into the left eye at bedtime.   lidocaine  (LIDODERM ) 5 % Place 1 patch onto the skin every 12 (twelve) hours. Remove & Discard patch within 12 hours or as directed by MD (Patient taking differently: Place 1 patch onto the skin daily as needed (pain). Remove & Discard patch within 12 hours or as directed by MD)   losartan  (COZAAR ) 100 MG tablet Take 1 tablet (100 mg total) by  mouth daily.   OXYGEN  Inhale 2-3 L/min into the lungs continuous.   [DISCONTINUED] chlorthalidone  (HYGROTON ) 25 MG tablet Take 25 mg by mouth every other day.   No facility-administered medications prior to visit.   Return to clinic with all medications she takes for thorough medication reconciliation.  Orders Placed This Encounter  Procedures   AMB Referral [facility]BCI Care Management    Referral Priority:   Routine    Referral Type:   Consultation    Referral Reason:   Care Coordination    Number of Visits Requested:   1   Ozell Kung MD 02/16/2024, 12:00 PM

## 2024-03-04 ENCOUNTER — Telehealth: Payer: Self-pay

## 2024-03-04 NOTE — Progress Notes (Signed)
 Care Guide Pharmacy Note  03/04/2024 Name: [name] MRN: [id number] DOB: [birth year]  Referred By: Shawn Sick, MD Reason for referral: Complex Care Management and Call Attempt #1 (Successful initial outreach scheduled with PHARM D- [place])   [name] is a 88 y.o. year old female who is a primary care patient of Shawn Sick, MD.  [name] was referred to the pharmacist for assistance related to: HTN  Successful contact was made with the patient to discuss pharmacy services including being ready for the pharmacist to call at least 5 minutes before the scheduled appointment time and to have medication bottles and any blood pressure readings ready for review. The patient agreed to meet with the pharmacist via telephone visit on (date/time). 03/28/24 @ 10 AM  [name], [facility] Guide  Direct Dial: [phone]  Fax [phone]

## 2024-03-08 ENCOUNTER — Other Ambulatory Visit: Payer: Self-pay | Admitting: Cardiology

## 2024-03-11 ENCOUNTER — Emergency Department (HOSPITAL_COMMUNITY)

## 2024-03-11 ENCOUNTER — Ambulatory Visit: Payer: Self-pay

## 2024-03-11 ENCOUNTER — Other Ambulatory Visit: Payer: Self-pay

## 2024-03-11 ENCOUNTER — Encounter (HOSPITAL_COMMUNITY): Payer: Self-pay

## 2024-03-11 ENCOUNTER — Telehealth: Payer: Self-pay

## 2024-03-11 ENCOUNTER — Emergency Department (HOSPITAL_COMMUNITY)
Admission: EM | Admit: 2024-03-11 | Discharge: 2024-03-12 | Disposition: A | Discharge status: Home / Self Care | Attending: Emergency Medicine | Admitting: Emergency Medicine

## 2024-03-11 ENCOUNTER — Ambulatory Visit: Admitting: Orthopedic Surgery

## 2024-03-11 DIAGNOSIS — I509 Heart failure, unspecified: Secondary | ICD-10-CM | POA: Insufficient documentation

## 2024-03-11 DIAGNOSIS — I110 Hypertensive heart disease with heart failure: Secondary | ICD-10-CM | POA: Insufficient documentation

## 2024-03-11 DIAGNOSIS — Z7982 Long term (current) use of aspirin: Secondary | ICD-10-CM | POA: Insufficient documentation

## 2024-03-11 DIAGNOSIS — M7989 Other specified soft tissue disorders: Secondary | ICD-10-CM | POA: Insufficient documentation

## 2024-03-11 DIAGNOSIS — Z79899 Other long term (current) drug therapy: Secondary | ICD-10-CM | POA: Insufficient documentation

## 2024-03-11 DIAGNOSIS — I2510 Atherosclerotic heart disease of native coronary artery without angina pectoris: Secondary | ICD-10-CM | POA: Insufficient documentation

## 2024-03-11 LAB — BASIC METABOLIC PANEL WITH GFR
Anion gap: 9 (ref 5–15)
BUN: 22 mg/dL (ref 8–23)
CO2: 30 mmol/L (ref 22–32)
Calcium: 10.2 mg/dL (ref 8.9–10.3)
Chloride: 97 mmol/L — ABNORMAL LOW (ref 98–111)
Creatinine, Ser: 1.16 mg/dL — ABNORMAL HIGH (ref 0.44–1.00)
GFR, Estimated: 46 mL/min — ABNORMAL LOW
Glucose, Bld: 109 mg/dL — ABNORMAL HIGH (ref 70–99)
Potassium: 4 mmol/L (ref 3.5–5.1)
Sodium: 136 mmol/L (ref 135–145)

## 2024-03-11 LAB — CBC
HCT: 26.9 % — ABNORMAL LOW (ref 36.0–46.0)
Hemoglobin: 8.4 g/dL — ABNORMAL LOW (ref 12.0–15.0)
MCH: 26.6 pg (ref 26.0–34.0)
MCHC: 31.2 g/dL (ref 30.0–36.0)
MCV: 85.1 fL (ref 80.0–100.0)
Platelets: 330 K/uL (ref 150–400)
RBC: 3.16 MIL/uL — ABNORMAL LOW (ref 3.87–5.11)
RDW: 15.9 % — ABNORMAL HIGH (ref 11.5–15.5)
WBC: 7.9 K/uL (ref 4.0–10.5)
nRBC: 0 % (ref 0.0–0.2)

## 2024-03-11 LAB — TROPONIN T, HIGH SENSITIVITY
Troponin T High Sensitivity: 15 ng/L (ref 0–19)
Troponin T High Sensitivity: 15 ng/L (ref 0–19)

## 2024-03-11 LAB — PRO BRAIN NATRIURETIC PEPTIDE: Pro Brain Natriuretic Peptide: 1103 pg/mL — ABNORMAL HIGH

## 2024-03-11 NOTE — Telephone Encounter (Signed)
 Call to patient-states the swelling continues in both of her legs.  Has been elevating her legs.  Redness around ankle.  Pain is 10 out of 10.  Plan is to go to the ER this afternoon. No shortness of breath.  Unable to apply support hose as legs are to swollen.

## 2024-03-11 NOTE — ED Provider Triage Note (Addendum)
 Emergency Medicine Provider Triage Evaluation Note  [name] , a 89 y.o. female  was evaluated in triage.  Pt complains of BL leg swelling but R>L. Also c/o pain in that area so bad that she can't sleep. Hasn't fallen or twisted ankle, no trauma. No missed doses of lasix . No CP, SOB, cough. On 2-3L O2 at baseline for chronic hypoxic resp failure  Recently admitted from 11/27-11/30 for AKI. Had R leg swelling complaint as well at [facility] ED on 01/24/24 and got DVT US  that was negative at that time. No h/o known blood clot. But reports her sxs are worse now.   Review of Systems  Positive: +leg swelling Negative: As above  Physical Exam  BP (!) 191/82 (BP Location: Right Arm)   Pulse 84   Temp 98.2 F (36.8 C) (Oral)   Resp 18   Ht 5' 2 (1.575 m)   Wt 97.1 kg   SpO2 100%   BMI 39.14 kg/m  Gen:   Awake, no distress   Resp:  Normal effort  MSK:   Moves extremities without difficulty  Other:  3+ pitting edem in BL LEs, R>L.   Medical Decision Making  Medically screening exam initiated at 6:27 PM.  Appropriate orders placed.  [name] was informed that the remainder of the evaluation will be completed by another provider, this initial triage assessment does not replace that evaluation, and the importance of remaining in the ED until their evaluation is complete.  Labs/imaging ordered, patient will be moved to treatment space when one becomes available. BP 191/82 but no acute distress or resp distress.      Franklyn Sid SAILOR, MD 03/11/24 [phone]

## 2024-03-11 NOTE — ED Triage Notes (Signed)
 PER EMS: pt is from home with c/o bilateral lag swelling, onset 3 weeks ago but worsened today. R > L. She takes Lasix  everyday. Denies shortness of breath or chest pain.   BP-192/90 HR-76 99% 2L baseline CBG-125

## 2024-03-11 NOTE — Telephone Encounter (Signed)
 The patient called in with complaints of bilateral let swelling a pain, mainly the right leg. H/o THA 20 yrs ago. Both feet are swollen. No redness, some warmth in the right thigh. She describes the pain as burning fire. She reports that she has had this problem x 3 weeks, and has been to the ER several times for it (She lives on the second floor, so she has been calling an ambulance each time). Her Tylenol  #3 is not helping this pain anymore, and she is having difficulty sleeping and moving around in her house.   The patient was on Dr. Crist schedule this afternoon at 1:15, but had to cancel due to lack of transportation. I gave her the number to [name], so that she may call them a day or 2 before her appt -- ? Be worked in later this week?   In the meantime, she is requesting some medicine for this pain/swelling.  CB# [phone]

## 2024-03-11 NOTE — ED Notes (Signed)
 When you have time, Tove Wideman (daughter) [phone] would like update on pt.

## 2024-03-11 NOTE — Telephone Encounter (Signed)
 SW pt, she says that her PCP wants her to go to the ED due to the pain and the redness around her ankle. She will let us  know how the visit goes and then she will make a follow up appointment with us .

## 2024-03-11 NOTE — Telephone Encounter (Signed)
 FYI Only or Action Required?: FYI only for provider: ED advised.  Patient was last seen in primary care on 02/15/2024 by Natalie Sharper, MD.  Called Nurse Triage reporting Leg Pain. Redness - pain of 10/10 in ankle, warmth and swelling  Symptoms began several weeks ago.  Interventions attempted: Other: seen in office.  Symptoms are: gradually worsening.  Triage Disposition: See HCP Within 4 Hours (Or PCP Triage)  Patient/caregiver understands and will follow disposition?: Yes - Pt will go to ED for care.                          Copied from CRM [id number]. Topic: Clinical - Red Word Triage >> Mar 11, 2024  2:54 PM Alfonso ORN wrote: Red Word that prompted transfer to Nurse Triage:  pain have severe pain in  right leg have slight redness down to shin to ankle  Jerel with [facility] calling on behalf of patient due to update on her pain  [facility] [phone] ----------------------------------------------------------------------- From previous Reason for Contact - Home Health Verbal Orders: Caller/Agency:  Callback Number:  Service Requested: Frequency:  Any new concerns about the patient? Reason for Disposition  [1] SEVERE pain (e.g., excruciating, unable to do any normal activities) AND [2] not improved after 2 hours of pain medicine  Answer Assessment - Initial Assessment Questions 1. ONSET: When did the pain start?      Weeks right leg 2. LOCATION: Where is the pain located?      Right leg 3. PAIN: How bad is the pain?    (Scale 1-10; or mild, moderate, severe)     10/10 - pain in ankle 4. WORK OR EXERCISE: Has there been any recent work or exercise that involved this part of the body?      no 5. CAUSE: What do you think is causing the leg pain?     unsure 6. OTHER SYMPTOMS: Do you have any other symptoms? (e.g., chest pain, back pain, breathing difficulty, swelling, rash, fever, numbness, weakness)     Warmth,  swollen warm and redness  Protocols used: Leg Pain-A-AH

## 2024-03-12 ENCOUNTER — Emergency Department (EMERGENCY_DEPARTMENT_HOSPITAL)

## 2024-03-12 DIAGNOSIS — M79661 Pain in right lower leg: Secondary | ICD-10-CM | POA: Diagnosis not present

## 2024-03-12 MED ORDER — OXYCODONE-ACETAMINOPHEN 5-325 MG PO TABS
1.0000 | ORAL_TABLET | Freq: Once | ORAL | Status: AC
  Administered 2024-03-12: 1 via ORAL
  Filled 2024-03-12: qty 1

## 2024-03-12 MED ORDER — DOXYCYCLINE HYCLATE 100 MG PO CAPS: 100.0000 mg | ORAL_CAPSULE | Freq: Two times a day (BID) | ORAL | 0 refills | Status: DC

## 2024-03-12 NOTE — ED Notes (Signed)
 ED Provider at bedside.

## 2024-03-12 NOTE — Discharge Instructions (Addendum)
 It was a pleasure taking care of you today.  As discussed, your workup was reassuring.  Continue taking your Lasix  as previously prescribed.  I am sending you home with an antibiotic.  Take as prescribed and finish all antibiotics.  Please have your legs rechecked by PCP at the end of this week.  Return to the ER for any new or worsening symptoms.

## 2024-03-12 NOTE — ED Notes (Signed)
 [name] has been called no ETA given

## 2024-03-12 NOTE — ED Notes (Signed)
 ED secretary asked to call [name] for patient.

## 2024-03-12 NOTE — Progress Notes (Signed)
 Right venous duplex lower extremity  has been completed. Refer to [facility] under chart review to view preliminary results.   03/12/2024  9:30 AM Masha Orbach, Ricka BIRCH

## 2024-03-12 NOTE — ED Provider Notes (Signed)
 " [place] [facility] Provider Note   CSN: [id number] Arrival date & time: 03/11/24  1806     Patient presents with: Leg Swelling   [name] is a 89 y.o. female with a past medical history significant for hyperlipidemia, hypertension, chronic venous insufficiency, GERD, CAD, chronic respiratory failure on 2-3 L nasal cannula, and CHF who presents to the ED due to bilateral lower extremity edema.  Patient states that her right lower extremity edema is greater than the left.  Edema has been present for the past 3 weeks.  Recently ran out of her Lasix  for 2 weeks and restarted it over the past 3 to 4 days.  Notes the edema worsened while she was out of her Lasix .  Denies chest pain.  Has chronic shortness of breath and notes no change from baseline.  Denies chest pain and cough.  Recently admitted to hospital 11/27 to 11/30 for AKI.  Denies history of blood clots.  Notes her right lower extremity feels warmer than her left.  No known injury.  History obtained from patient and past medical records. No interpreter used during encounter.       Prior to Admission medications  Medication Sig Start Date End Date Taking? Authorizing Provider  acetaminophen  (TYLENOL ) 650 MG CR tablet Take 650 mg by mouth every 8 (eight) hours as needed for pain.   Yes [provider]  albuterol  (VENTOLIN  HFA) 108 (90 Base) MCG/ACT inhaler INHALE 2 PUFFS INTO THE LUNGS EVERY 6 HOURS AS NEEDED FOR WHEEZING OR SHORTNESS OF BREATH. 03/04/22  Yes Lovie Clarity, MD  aspirin  EC 81 MG tablet Take 81 mg by mouth daily.   Yes [provider]  brimonidine  (ALPHAGAN ) 0.2 % ophthalmic solution Place 1 drop into the left eye 2 (two) times daily. 07/20/21  Yes [provider]  carvedilol  (COREG ) 25 MG tablet TAKE 1 TABLET BY MOUTH TWICE DAILY WITH A MEAL 08/21/23  Yes Lovie Clarity, MD  dorzolamide -timolol  (COSOPT ) 2-0.5 % ophthalmic solution Place 1 drop into both eyes 2  (two) times daily.   Yes [provider]  doxycycline  (VIBRAMYCIN ) 100 MG capsule Take 1 capsule (100 mg total) by mouth 2 (two) times daily. 03/12/24  Yes Aerin Delany C, PA-C  fluticasone  furoate-vilanterol (BREO ELLIPTA ) 200-25 MCG/ACT AEPB INHALE 1 PUFF INTO THE LUNGS DAILY 03/20/23  Yes Hope Almarie ORN, NP  furosemide  (LASIX ) 40 MG tablet TAKE 1 TABLET BY MOUTH DAILY AS NEEDED (FOR WEIGHT GAIN OR 3 LBS OVERNIGHT OR 5 LBS IN WEEK) 01/30/24  Yes Shawn Sick, MD  [facility] DICLOFENAC  SODIUM 1 % GEL APPLY 2 GRAMS TOPICALLY 4 TIMES DAILY Patient taking differently: Apply 2 g topically in the morning, at noon, and at bedtime. 03/14/23  Yes Lovie Clarity, MD  hydrALAZINE  (APRESOLINE ) 25 MG tablet Take 25 mg by mouth 3 (three) times daily.   Yes [provider]  latanoprost  (XALATAN ) 0.005 % ophthalmic solution Place 1 drop into the left eye at bedtime. 06/26/21  Yes [provider]  lidocaine  (LIDODERM ) 5 % Place 1 patch onto the skin every 12 (twelve) hours. Remove & Discard patch within 12 hours or as directed by MD Patient taking differently: Place 1 patch onto the skin daily as needed (pain). Remove & Discard patch within 12 hours or as directed by MD 01/30/24  Yes Shawn Sick, MD  losartan  (COZAAR ) 100 MG tablet Take 1 tablet (100 mg total) by mouth daily. 09/19/23  Yes Lovie Clarity,  MD  OXYGEN  Inhale 2-3 L/min into the lungs continuous.    [provider]    Allergies: Metoprolol , Acyclovir and related, and Food    Review of Systems  Constitutional:  Negative for fever.  Respiratory:  Negative for shortness of breath.   Cardiovascular:  Positive for leg swelling. Negative for chest pain.    Updated Vital Signs BP (!) 180/77   Pulse 77   Temp 98 F (36.7 C) (Oral)   Resp (!) 22   Ht 5' 2 (1.575 m)   Wt 97.1 kg   SpO2 100%   BMI 39.14 kg/m   Physical Exam Vitals and nursing note reviewed.  Constitutional:      General: She is not in acute  distress.    Appearance: She is not ill-appearing.  HENT:     Head: Normocephalic.  Eyes:     Pupils: Pupils are equal, round, and reactive to light.  Cardiovascular:     Rate and Rhythm: Normal rate and regular rhythm.     Pulses: Normal pulses.     Heart sounds: Normal heart sounds. No murmur heard.    No friction rub. No gallop.  Pulmonary:     Effort: Pulmonary effort is normal.     Breath sounds: Normal breath sounds.  Abdominal:     General: Abdomen is flat. There is no distension.     Palpations: Abdomen is soft.     Tenderness: There is no abdominal tenderness. There is no guarding or rebound.  Musculoskeletal:        General: Normal range of motion.     Cervical back: Neck supple.     Comments: 2+ pitting edema bilaterally R>L  Skin:    General: Skin is warm and dry.  Neurological:     General: No focal deficit present.     Mental Status: She is alert.  Psychiatric:        Mood and Affect: Mood normal.        Behavior: Behavior normal.     (all labs ordered are listed, but only abnormal results are displayed) Labs Reviewed  BASIC METABOLIC PANEL WITH GFR - Abnormal; Notable for the following components:      Result Value   Chloride 97 (*)    Glucose, Bld 109 (*)    Creatinine, Ser 1.16 (*)    GFR, Estimated 46 (*)    All other components within normal limits  CBC - Abnormal; Notable for the following components:   RBC 3.16 (*)    Hemoglobin 8.4 (*)    HCT 26.9 (*)    RDW 15.9 (*)    All other components within normal limits  PRO BRAIN NATRIURETIC PEPTIDE - Abnormal; Notable for the following components:   Pro Brain Natriuretic Peptide 1,103.0 (*)    All other components within normal limits  TROPONIN T, HIGH SENSITIVITY  TROPONIN T, HIGH SENSITIVITY    EKG: EKG Interpretation Date/Time:  Monday March 11 2024 18:30:23 EST Ventricular Rate:  79 PR Interval:  182 QRS Duration:  92 QT Interval:  376 QTC Calculation: 431 R Axis:   7  Text  Interpretation: Normal sinus rhythm Minimal voltage criteria for LVH, may be normal variant ( R in aVL ) Abnormal QRS-T angle, consider primary T wave abnormality  Confirmed by Franklyn Gills [phone]) on 03/11/2024 6:32:37 PM  Radiology: VAS US  LOWER EXTREMITY VENOUS (DVT) (7a-5p) Result Date: 03/12/2024  Lower Venous DVT Study Patient Name:  Edlin L Guillette  Date  of Exam:   03/12/2024 Medical Rec #: [id number]    Accession #:    [id number] Date of Birth: 03-Jul-1935    Patient Gender: F Patient Age:   79 years Exam Location:  [facility] Procedure:      VAS US  LOWER EXTREMITY VENOUS (DVT) Referring Phys: HAYLEY NAASZ --------------------------------------------------------------------------------  Indications: Pain, Swelling, and Edema. Other Indications: Recent fall that resulted in twisted ankle. Comparison Study: 01/24/24 - Limited negative right lower extremity Performing Technologist: Ricka Sturdivant-Jones RDMS, RVT  Examination Guidelines: A complete evaluation includes B-mode imaging, spectral Doppler, color Doppler, and power Doppler as needed of all accessible portions of each vessel. Bilateral testing is considered an integral part of a complete examination. Limited examinations for reoccurring indications may be performed as noted. The reflux portion of the exam is performed with the patient in reverse Trendelenburg.  +---------+---------------+---------+-----------+----------+--------------+ RIGHT    CompressibilityPhasicitySpontaneityPropertiesThrombus Aging +---------+---------------+---------+-----------+----------+--------------+ CFV      Full           Yes      Yes                                 +---------+---------------+---------+-----------+----------+--------------+ SFJ      Full                                                        +---------+---------------+---------+-----------+----------+--------------+ FV Prox  Full                                                         +---------+---------------+---------+-----------+----------+--------------+ FV Mid   Full           Yes      Yes                                 +---------+---------------+---------+-----------+----------+--------------+ FV DistalFull                                                        +---------+---------------+---------+-----------+----------+--------------+ PFV      Full                                                        +---------+---------------+---------+-----------+----------+--------------+ POP      Full           Yes      Yes                                 +---------+---------------+---------+-----------+----------+--------------+ PTV      Full                                                        +---------+---------------+---------+-----------+----------+--------------+  PERO     Full                                                        +---------+---------------+---------+-----------+----------+--------------+   +----+---------------+---------+-----------+----------+--------------+ LEFTCompressibilityPhasicitySpontaneityPropertiesThrombus Aging +----+---------------+---------+-----------+----------+--------------+ CFV Full           Yes      Yes                                 +----+---------------+---------+-----------+----------+--------------+ SFJ Full                                                        +----+---------------+---------+-----------+----------+--------------+    Summary: RIGHT: - There is no evidence of deep vein thrombosis in the lower extremity.  - No cystic structure found in the popliteal fossa.  LEFT: - No evidence of common femoral vein obstruction.   *See table(s) above for measurements and observations. Electronically signed by Lonni Gaskins MD on 03/12/2024 at 11:08:25 AM.    Final    DG Ankle 2 Views Right Result Date: 03/11/2024 EXAM: 2 VIEW(S) XRAY OF THE RIGHT ANKLE 03/11/2024  07:07:00 PM CLINICAL HISTORY: right ankle pain/swelling COMPARISON: None available. FINDINGS: BONES AND JOINTS: No acute fracture. The talar dome is intact. No widening of the ankle mortise. Small Achilles insertion enthesophyte with undersurface calcaneal heel spurring. Midfoot degenerative changes. SOFT TISSUES: Diffuse lower extremity edema. Peripheral vascular atherosclerosis. IMPRESSION: 1. No acute fracture or dislocation. 2. Diffuse lower extremity soft tissue edema, which may be due to dependent edema or cellulitis. Clinical correlation requested. Electronically signed by: Rogelia Myers MD 03/11/2024 07:58 PM EST RP Workstation: [id number]   DG Chest 2 View Result Date: 03/11/2024 EXAM: 2 VIEW(S) XRAY OF THE CHEST 03/11/2024 07:07:00 PM COMPARISON: 02/02/2024 CLINICAL HISTORY: sob FINDINGS: LUNGS AND PLEURA: Stable chronic elevation of the left hemidiaphragm. No confluent airspace opacities. No pleural effusion. No pneumothorax. HEART AND MEDIASTINUM: No acute abnormality of the cardiac and mediastinal silhouettes. BONES AND SOFT TISSUES: Gaseous distention of the stomach. No acute osseous abnormality. IMPRESSION: 1. No acute findings. Electronically signed by: Franky Crease MD 03/11/2024 07:55 PM EST RP Workstation: [id number]     Procedures   Medications Ordered in the ED  oxyCODONE -acetaminophen  (PERCOCET/ROXICET) 5-325 MG per tablet 1 tablet (1 tablet Oral Given 03/12/24 9167)                                    Medical Decision Making Amount and/or Complexity of Data Reviewed Independent Historian: caregiver    Details: Son at bedside provided history External Data Reviewed: notes.    Details: Recent admission note Labs: ordered. Decision-making details documented in ED Course. Radiology: ordered and independent interpretation performed. Decision-making details documented in ED Course. ECG/medicine tests: ordered and independent interpretation performed. Decision-making details  documented in ED Course.   This patient presents to the ED for concern of lower extremity edema, this involves an extensive number of treatment options, and is a complaint that carries with it a high risk of complications and  morbidity.  The differential diagnosis includes CHF exacerbation, DVT, cellulitis, venous insufficiency, etc  89 year old female presents to the ED due to bilateral lower extremity edema x 3 weeks.  Recently ran out of her Lasix  however, restarted a few days ago.  Denies chest pain and shortness of breath.  On chronic 2 to 3 L nasal cannula at baseline.  Denies injury.  No history of blood clots.  Unfortunately, patient waited over 19 hours prior to my initial evaluation due to long wait times.  Upon arrival patient afebrile, not tachycardic.  On 2 L nasal cannula with O2 saturation at 100%.  Patient does have 2+ pitting edema bilaterally right greater than left. RLE slightly warmer compared to left. Pedal pulses palpable bilaterally.  Labs ordered in triage.  Ultrasound rule out DVT.  X-ray to rule out bony fracture.  CBC with no leukocytosis.  Hemoglobin at 8.4 which appears to be around patient's baseline.  BMP with creatinine 1.1.  Normal BUN.  BNP elevated 1103. Troponin normal.  Right ankle x-ray personally reviewed and interpreted which is negative for any bony fracture.  Does demonstrate diffuse lower extremity soft tissue edema which may represent dependent edema versus cellulitis.  Chest x-ray negative for evidence of pneumonia or other acute abnormalities. US  negative for DVT.   Given asymmetry and slight warmth of right lower extremity will cover with antibiotic for possible cellulitis.  Advised patient to continue taking her Lasix  as previously prescribed and to have her legs rechecked by PCP by the end of this week.  Patient made aware to return to the ED if she develops a fever, streaking up her right lower extremity or any worsening symptoms. Patient with baseline  oxygen  requirement. No evidence of respiratory distress on baseline oxygen . Do not feel patient requires admission for IV diuresis. Discussed with Dr. Elige who evaluated patient at bedside and agrees with assessment and plan. Strict ED precautions discussed with patient. Patient states understanding and agrees to plan. Patient discharged home in no acute distress and stable vitals  Co morbidities that complicate the patient evaluation  HTN, CHF Cardiac Monitoring: / EKG:  The patient was maintained on a cardiac monitor.  I personally viewed and interpreted the cardiac monitored which showed an underlying rhythm of: NSR, HR 79  Social Determinants of Health:  Elderly >65  Test / Admission - Considered:  Considered admission; however feel patient is stable for outpatient therapy. Unclear what is causing patient's lower extremity edema. CHF vs. Venous insufficiency vs. Cellulitis. No evidence of DVT on US .      Final diagnoses:  Leg swelling    ED Discharge Orders          Ordered    doxycycline  (VIBRAMYCIN ) 100 MG capsule  2 times daily        03/12/24 1442               Krisandra Bueno C, PA-C 03/12/24 1447  "

## 2024-03-12 NOTE — ED Notes (Signed)
 When you have time, Tove Wideman (daughter) [phone] would like update on pt.

## 2024-03-12 NOTE — Telephone Encounter (Signed)
 Patient is currently in the ED.

## 2024-03-13 ENCOUNTER — Ambulatory Visit: Payer: Self-pay | Admitting: *Deleted

## 2024-03-13 ENCOUNTER — Other Ambulatory Visit (HOSPITAL_COMMUNITY): Payer: Self-pay

## 2024-03-13 ENCOUNTER — Telehealth: Payer: Self-pay

## 2024-03-13 ENCOUNTER — Telehealth: Payer: Self-pay | Admitting: *Deleted

## 2024-03-13 MED ORDER — NAPROXEN 500 MG PO TABS: 500.0000 mg | ORAL_TABLET | Freq: Two times a day (BID) | ORAL | 0 refills | Status: DC

## 2024-03-13 NOTE — Telephone Encounter (Signed)
" °  FYI Only or Action Required?: Action required by provider: update on patient condition and declines to make f/u appointment.  Patient was last seen in primary care on 02/15/2024 by Norrine Sharper, MD.  Called Nurse Triage reporting need for pain medication   Symptoms began several weeks ago.  Interventions attempted: Rest, hydration, or home remedies.  Symptoms are: gradually worsening.  Triage Disposition: See PCP When Office is Open (Within 3 Days)  Patient/caregiver understands and will follow disposition?: No, refuses disposition  Copied from CRM #[id number]. Topic: Clinical - Red Word Triage >> Mar 13, 2024 11:45 AM [name] wrote: Red Word that prompted transfer to Nurse Triage: Patient is calling in stating that she went to the hospital yesterday for severe pain in her legs. Patient stated the hospital did not give her anything for the pain and she is still in severe pain. Reason for Disposition  [1] MODERATE pain (e.g., interferes with normal activities, limping) AND [2] present > 3 days  Answer Assessment - Initial Assessment Questions 1. ONSET: When did the pain start?      3 weeks ago- patient states she has been to the ED 3 times 2. LOCATION: Where is the pain located?      R leg- above the ankle 3. PAIN: How bad is the pain?    (Scale 1-10; or mild, moderate, severe)     Sharp pain, aching pain 4. WORK OR EXERCISE: Has there been any recent work or exercise that involved this part of the body?      no 5. CAUSE: What do you think is causing the leg pain?     Infection/fluid diagnosed and treated with antibiotic  6. OTHER SYMPTOMS: Do you have any other symptoms? (e.g., chest pain, back pain, breathing difficulty, swelling, rash, fever, numbness, weakness)     No  Patient has called to ED for pain management-naproxen  (NAPROSYN ) 500 MG tablet has been ordered. Patient notified and will check back with her pharmacy. Patient declined to make appointment for  follow up due to transportation. Patient states she lives on second floor apartment and has trouble getting down stairs. She mostly is transported by ambulance to ED.  Protocols used: Leg Pain-A-AH  "

## 2024-03-13 NOTE — Telephone Encounter (Signed)
 Copied from CRM [id number]. Topic: Clinical - Prescription Issue >> Mar 13, 2024 12:23 PM Mercer PEDLAR wrote: Reason for CRM: Patient stated that the pharmacy dis not receive the prescription for naproxen  (NAPROSYN ) 500 MG tablet which was sent today.   [id number] [address], [place] - [address], [place] [address] Phone: [phone]  Fax: [phone]

## 2024-03-13 NOTE — Telephone Encounter (Signed)
 Patient called as her naproxen  had not been received by the pharmacy. CM instructed patient that it is available OTC as Aleve  and to take as directed by her MD. Patient verbalized understanding.   Merilee Batty, MSN, RN Case Management [phone]

## 2024-03-13 NOTE — Telephone Encounter (Signed)
 Call from pt Having difficulty getting naproxen  prescription (rx'd during 03/11/24 ED visit) Triage nurse has already confirmed that pharmacy did not received rx and note was sent to have medication resent by prescribing physician  Pt aware.Koltyn Kelsay Cassady1/7/20262:31 PM

## 2024-03-13 NOTE — Telephone Encounter (Signed)
 Call to Pharmacy did not receive the prescription.

## 2024-03-13 NOTE — Telephone Encounter (Signed)
 Pt called regarding not getting Rx for pain.  RNCM discussed with EDP who saw pt.  EDP to send in Rx for pain.  Information relayed to pt.  Annabel Gibeau J. Debarah, BSN, RN, [facility]  Inpatient Care Management  Nurse Case Manager  [facility]  Operative Services  [phone]

## 2024-03-13 NOTE — Telephone Encounter (Cosign Needed)
 Patient requesting prescription for pain medication. I personally saw patient yesterday for lower extremity edema. Being treated for possible cellulitis with antibiotics. Prescription for naproxen  sent to pharmacy.

## 2024-03-14 ENCOUNTER — Other Ambulatory Visit: Payer: Self-pay

## 2024-03-14 MED ORDER — NAPROXEN 500 MG PO TABS: 500.0000 mg | ORAL_TABLET | Freq: Two times a day (BID) | ORAL | 0 refills | Status: AC

## 2024-03-14 NOTE — Telephone Encounter (Signed)
 Pt was called / informed of rx - very appreciative.

## 2024-03-14 NOTE — Telephone Encounter (Signed)
 Pt is calling again; stated her pain is a #10. Stated she has tried everything she was told to do. Naprosyn  500 mg was ordered by Dr Miller/Caroline Lorelle PA on 03/14/23. I called [facility] who stated they did not received the rx. Sending to pt 's PCP to see if she would re-send rx to [facility].

## 2024-03-14 NOTE — Telephone Encounter (Signed)
 Copied from CRM #[id number]. Topic: Clinical - Medication Refill >> Mar 14, 2024 11:19 AM Cherylann RAMAN wrote: Medication: acetaminophen  (TYLENOL ) 650 MG CR tablet  Has the patient contacted their pharmacy? No (Agent: If no, request that the patient contact the pharmacy for the refill. If patient does not wish to contact the pharmacy document the reason why and proceed with request.) (Agent: If yes, when and what did the pharmacy advise?)  This is the patient's preferred pharmacy:  [facility] [address] [place] - [address] AT [address] [place] [address] Phone: [phone] Fax: [phone]  Is this the correct pharmacy for this prescription? Yes If no, delete pharmacy and type the correct one.   Has the prescription been filled recently? No  Is the patient out of the medication? Yes  Has the patient been seen for an appointment in the last year OR does the patient have an upcoming appointment? Yes  Can we respond through MyChart? No  Agent: Please be advised that Rx refills may take up to 3 business days. We ask that you follow-up with your pharmacy.

## 2024-03-28 ENCOUNTER — Other Ambulatory Visit: Payer: Self-pay

## 2024-03-28 ENCOUNTER — Other Ambulatory Visit (HOSPITAL_COMMUNITY): Payer: Self-pay

## 2024-03-28 DIAGNOSIS — I5030 Unspecified diastolic (congestive) heart failure: Secondary | ICD-10-CM

## 2024-03-28 DIAGNOSIS — I10 Essential (primary) hypertension: Secondary | ICD-10-CM

## 2024-03-28 DIAGNOSIS — I110 Hypertensive heart disease with heart failure: Secondary | ICD-10-CM

## 2024-03-28 NOTE — Progress Notes (Signed)
 "  03/28/2024 Name: [name] MRN: [id number] DOB: June 02, 1935  Chief Complaint  Patient presents with   Hypertension    [name] is a 89 y.o. year old female who presented for a telephone visit.   They were referred to the pharmacist by their PCP for assistance in managing hypertension and med adherence. PMH includes HTN, chronic venous insufficiency, HFpEF, OSA, ILD (restrictive lung disease), GERD, HLD.   Subjective: Patient was last seen by PCP, Dr. Norrine, on 02/15/24. At last visit, BP was 133/80 mmHg. Patient unsure of medications she is taking. Chlorthalidone  had been held after recent hospitalization for AKI. She was hospitalized in Nov 2025 for AKI in the setting of decreased PO intake of possible overuse of furosemide . Hydralazine , amlodipine -atorvastatin , and chlorthalidone  were stopped at discharge. Patient was seen in the ED on 03/11/24 for leg swelling and was prescribed a 5 day course of doxycycline . She had been out of furosemide  for 2 weeks. Advised PCP f/u in one week, but patient has transportation barriers (only transported by EMS).   Today, patient reports doing ok. States the swelling in her feet as gone down. Still has leg swelling, but closer to baseline. States redness in R leg has decreased. It is still painful, but not getting worse. Does not thinks she will be able to come in person for evaluation, as she states she does not have the funds to continue using EMS transport. She is at home and is able to review her medication bottles with me today  Care Team: Primary Care Provider: Shawn Sick, MD ; Next Scheduled Visit: Needs to be scheduled - patient states she is unable to schedule today  Medication Access/Adherence  Current Pharmacy:  [id number] - [place], [place] - [address] AT [address] [place] [address] Phone: [phone] Fax: [phone]  [facility] - [place], [place] - [address] [place] [address] Phone: [phone] Fax: [phone]   Patient reports affordability concerns with their medications: No  - Breo Ellipta  $12.65/mo Patient reports access/transportation concerns to their pharmacy: No  - using [facility] Patient reports adherence concerns with their medications:  No  - denies missed doses of oral medications   Medication Management:  Current adherence strategy: takes her medications out of the bottles. Takes medications twice daily  Patient reports Good adherence to medications  Recent fill dates:  - Furosemide  40 mg (03/04/24 for 30ds) - taking 1 tab once daily. Not able to weigh herself. Not taking any extra tablets for swelling. - Naproxen  500 mg (03/14/24 for 15 ds) - taking 1 tab twice a day - Pantoprazole  40 mg (06/07/23 for 90ds)- taking 1 tab as needed (took the day before yesterday) - Losartan  100 mg daily (02/12/24 for 90ds) - taking 1 tab daily - Hydralazine  25 mg (11/27/23 for 90ds, 0 refills) - 1 tab twice daily ( - Carvedilol  25 mg (03/28/24) - 1 tab twice daily - Aspirin  81 mg - 1 tab daily  Breo Ellipta  is on her medication list. Last dispensed 08/13/23 for 90ds. Patient says she has 28 doses left in her inhaler. She is not using everyday. Only as needed when she gets up. Ran out of refills at [facility]. She has not seen pulm in > 1 year.  Amlodipine -atorvastatin  10-40 mg daily is appearing on dispense report from [facility] 03/26/24. Patient does not have this bottle with her today, but it may  be on the way. This medication was supposed to be discontinued at hospital discharge. She confirms she does not have chlorthalidone  bottle.  Hypertension:  Current medications: losartan  100 mg daily, carvedilol  25 mg 1 tab twice daily, furosemide  40 mg 1 tab once daily. Medications previously tried: amlodipine  (LEE - 2013)  Patient has an automated WRIST BP cuff - she is checking usually once daily in the  mornings. Current blood pressure readings readings:  Reports SBP 120-130s mmHg Cannot recall DBP  Patient denies hypotensive s/sx including dizziness, lightheadedness.  Patient denies hypertensive symptoms including headache, chest pain, shortness of breath (has baseline ShOB, on continuous O2, but reports ShOB is stable)   Objective:  BP Readings from Last 3 Encounters:  03/12/24 (!) 165/75  02/15/24 133/80  02/04/24 (!) 138/58    Lab Results  Component Value Date   HGBA1C 6.0 (H) 05/31/2023   HGBA1C 6.0 (H) 05/19/2022   HGBA1C 5.6 08/24/2021       Latest Ref Rng & Units 03/11/2024    6:25 PM 02/15/2024    2:25 PM 02/04/2024    7:37 AM  BMP  Glucose 70 - 99 mg/dL 890  881  893   BUN 8 - 23 mg/dL 22  22  29    Creatinine 0.44 - 1.00 mg/dL 8.83  8.55  8.53   BUN/Creat Ratio 12 - 28  15    Sodium 135 - 145 mmol/L 136  136  135   Potassium 3.5 - 5.1 mmol/L 4.0  4.1  4.1   Chloride 98 - 111 mmol/L 97  94  92   CO2 22 - 32 mmol/L 30  27  31    Calcium  8.9 - 10.3 mg/dL 89.7  89.5  9.4     Lab Results  Component Value Date   CHOL 119 05/19/2022   HDL 60 05/19/2022   LDLCALC 45 05/19/2022   TRIG 69 05/19/2022   CHOLHDL 2.0 05/19/2022    Medications Reviewed Today     Reviewed by Brinda Lorain SQUIBB, RPH-CPP (Pharmacist) on 03/28/24 at 1049  Med List Status: <None>   Medication Order Taking? Sig Documenting Provider Last Dose Status Informant  acetaminophen  (TYLENOL ) 650 MG CR tablet [id number]  Take 650 mg by mouth every 8 (eight) hours as needed for pain. [provider]  Active Pharmacy Records, Self           Med Note HAROLDINE, LUKE A   Tue Mar 12, 2024  2:33 PM) Just ran out at home  acetaminophen -codeine  (TYLENOL  #3) 300-30 MG tablet [id number] Yes Take 1 tablet by mouth every 8 (eight) hours as needed for moderate pain (pain score 4-6). Shawn Sick, MD  Active   albuterol  (VENTOLIN  HFA) 108 [phone] Base) MCG/ACT inhaler [id number] Yes INHALE 2 PUFFS INTO THE LUNGS  EVERY 6 HOURS AS NEEDED FOR WHEEZING OR SHORTNESS OF BREATH. Lovie Clarity, MD  Active Pharmacy Records, Self  aspirin  EC 81 MG tablet [id number] Yes Take 81 mg by mouth daily. [provider]  Active Pharmacy Records, Self  brimonidine  (ALPHAGAN ) 0.2 % ophthalmic solution [id number] Yes Place 1 drop into the left eye 2 (two) times daily. [provider]  Active Pharmacy Records, Self  carvedilol  (COREG ) 25 MG tablet [id number] Yes TAKE 1 TABLET BY MOUTH TWICE DAILY WITH A MEAL Lovie Clarity, MD  Active Pharmacy Records, Self  dorzolamide -timolol  (COSOPT ) 2-0.5 % ophthalmic solution [id number] Yes Place 1 drop into both eyes 2 (two) times daily. [provider]  Active Pharmacy  Records, Self    Discontinued 03/28/24 1015 (Completed Course)   fluticasone  furoate-vilanterol (BREO ELLIPTA ) 200-25 MCG/ACT AEPB [id number]  INHALE 1 PUFF INTO THE LUNGS DAILY  Patient not taking: Reported on 03/28/2024   Hope Almarie ORN, NP  Active Pharmacy Records, Self  furosemide  (LASIX ) 40 MG tablet [id number] Yes TAKE 1 TABLET BY MOUTH DAILY AS NEEDED (FOR WEIGHT GAIN OR 3 LBS OVERNIGHT OR 5 LBS IN WEEK)  Patient taking differently: Take 40 mg by mouth daily. TAKE 1 TABLET BY MOUTH DAILY AS NEEDED (FOR WEIGHT GAIN OR 3 LBS OVERNIGHT OR 5 LBS IN WEEK)   Shawn Sick, MD  Active Pharmacy Records, Self  [facility] DICLOFENAC  SODIUM 1 % GEL [id number]  APPLY 2 GRAMS TOPICALLY 4 TIMES DAILY  Patient taking differently: Apply 2 g topically in the morning, at noon, and at bedtime.   Lovie Clarity, MD  Active Pharmacy Records, Self  hydrALAZINE  (APRESOLINE ) 25 MG tablet [id number] Yes Take 25 mg by mouth 3 (three) times daily. [provider]  Active Self, Pharmacy Records  latanoprost  (XALATAN ) 0.005 % ophthalmic solution [id number] Yes Place 1 drop into the left eye at bedtime. [provider]  Active Pharmacy Records, Self  lidocaine  (LIDODERM ) 5 % [id number] Yes Place 1 patch onto the skin  every 12 (twelve) hours. Remove & Discard patch within 12 hours or as directed by MD  Patient taking differently: Place 1 patch onto the skin daily as needed (pain). Remove & Discard patch within 12 hours or as directed by MD   Shawn Sick, MD  Active Pharmacy Records, Self  losartan  (COZAAR ) 100 MG tablet [id number] Yes Take 1 tablet (100 mg total) by mouth daily. Lovie Clarity, MD  Active Pharmacy Records, Self  naproxen  (NAPROSYN ) 500 MG tablet [id number] Yes Take 1 tablet (500 mg total) by mouth 2 (two) times daily. Shawn Sick, MD  Active   OXYGEN  [id number] Yes Inhale 2-3 L/min into the lungs continuous. [provider]  Active Pharmacy Records, Self  pantoprazole  (PROTONIX ) 40 MG tablet [id number] Yes Take 40 mg by mouth daily as needed (for heartburn). [provider]  Active               Assessment/Plan:   Hypertension: - Currently controlled with home and clinic BP consistently below goal less than 130/80. Patient had continued hydralazine  despite it being discontinued at hospital discharge in Nov 2025. Reason for discontinuation was to reduce pill burden. Patient reports that BP has been controlled at home. Also close to goal at last PCP office. Low dose hydralazine  BID is likely not having significant BP lowering effect. Agree with discontinuing to minimize pill burden and following-up to assess BP control. Agree with continuation of carvedilol , losartan , and furosemide  at this time.  - Reviewed long term cardiovascular and renal outcomes of uncontrolled blood pressure - Reviewed appropriate blood pressure monitoring technique and reviewed goal blood pressure. Recommended to check home blood pressure and heart rate  once daily and keep a log to bring to upcoming appointments - Recommend to stop hydralazine  to reduce pill burden - Recommend to continue losartan  100 mg daily, carvedilol  25 mg BID, and furosemide  40 mg daily     Medication Management: - Currently  strategy sufficient to maintain appropriate adherence to prescribed medication regimen. Patient advised she can continue taking pantoprazole  40 mg PRN for heartburn. Advised to discontinue hydralazine  and monitor BP as described above to reduce pill burden.  - Called [facility] mail [facility] pharmacy to discontinue amlodipine -atorvastatin ,  which was pending refill at time of call. - Noted patient has no refills remaining for Breo Ellipta . Will discuss with PCP whether to continue at this time. Patient is currently only taking 1 puff very intermittently.    Patient verbalized understanding of treatment plan. Recommended that patient follow up in clinic for evaluation of her leg, but patient reports she cannot obtain transportation. Advised to call clinic if leg becomes more swollen, more red, more painful, or if she develops s/sx of fever or infection.    Follow Up Plan:  Pharmacist 04/22/24 PCP clinic visit needs to be scheduled   Lorain Baseman, PharmD [facility] [phone]   "

## 2024-04-09 ENCOUNTER — Other Ambulatory Visit: Payer: Self-pay | Admitting: Primary Care

## 2024-04-12 ENCOUNTER — Other Ambulatory Visit: Payer: Self-pay

## 2024-04-12 MED ORDER — DICLOFENAC SODIUM 1 % EX GEL: 2.0000 g | Freq: Three times a day (TID) | CUTANEOUS | 2 refills | Status: AC

## 2024-04-22 ENCOUNTER — Other Ambulatory Visit: Payer: Self-pay
